# Patient Record
Sex: Female | Born: 1940 | Race: White | Hispanic: No | Marital: Married | Smoking: Former smoker
Health system: Southern US, Community
[De-identification: ages and names within clinical notes are randomized; demographics above are authoritative.]

## PROBLEM LIST (undated history)

## (undated) DIAGNOSIS — I2510 Atherosclerotic heart disease of native coronary artery without angina pectoris: Secondary | ICD-10-CM

## (undated) DIAGNOSIS — F40240 Claustrophobia: Secondary | ICD-10-CM

## (undated) DIAGNOSIS — F419 Anxiety disorder, unspecified: Secondary | ICD-10-CM

## (undated) DIAGNOSIS — I4891 Unspecified atrial fibrillation: Secondary | ICD-10-CM

## (undated) DIAGNOSIS — N186 End stage renal disease: Secondary | ICD-10-CM

## (undated) DIAGNOSIS — N189 Chronic kidney disease, unspecified: Secondary | ICD-10-CM

## (undated) DIAGNOSIS — E7800 Pure hypercholesterolemia, unspecified: Secondary | ICD-10-CM

## (undated) DIAGNOSIS — D631 Anemia in chronic kidney disease: Secondary | ICD-10-CM

## (undated) DIAGNOSIS — Z992 Dependence on renal dialysis: Secondary | ICD-10-CM

## (undated) DIAGNOSIS — I10 Essential (primary) hypertension: Secondary | ICD-10-CM

## (undated) DIAGNOSIS — M1990 Unspecified osteoarthritis, unspecified site: Secondary | ICD-10-CM

## (undated) DIAGNOSIS — Z923 Personal history of irradiation: Secondary | ICD-10-CM

## (undated) DIAGNOSIS — C8330 Diffuse large B-cell lymphoma, unspecified site: Secondary | ICD-10-CM

## (undated) DIAGNOSIS — F410 Panic disorder [episodic paroxysmal anxiety] without agoraphobia: Secondary | ICD-10-CM

## (undated) DIAGNOSIS — E119 Type 2 diabetes mellitus without complications: Secondary | ICD-10-CM

## (undated) HISTORY — PX: SHOULDER OPEN ROTATOR CUFF REPAIR: SHX2407

## (undated) HISTORY — DX: Chronic kidney disease, unspecified: N18.9

## (undated) HISTORY — DX: Personal history of irradiation: Z92.3

## (undated) HISTORY — DX: Anemia in chronic kidney disease: D63.1

---
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
## 1979-06-30 HISTORY — PX: ABDOMINAL HYSTERECTOMY: SHX81

## 1998-05-31 ENCOUNTER — Other Ambulatory Visit: Admission: RE | Admit: 1998-05-31 | Discharge: 1998-05-31 | Payer: Self-pay | Admitting: Obstetrics & Gynecology

## 1999-04-21 ENCOUNTER — Emergency Department (HOSPITAL_COMMUNITY): Admission: EM | Admit: 1999-04-21 | Discharge: 1999-04-21 | Payer: Self-pay | Admitting: Emergency Medicine

## 1999-04-21 ENCOUNTER — Encounter: Payer: Self-pay | Admitting: Cardiology

## 1999-05-12 ENCOUNTER — Encounter: Payer: Self-pay | Admitting: Emergency Medicine

## 1999-05-13 ENCOUNTER — Inpatient Hospital Stay (HOSPITAL_COMMUNITY): Admission: EM | Admit: 1999-05-13 | Discharge: 1999-05-15 | Payer: Self-pay | Admitting: Emergency Medicine

## 1999-07-11 ENCOUNTER — Other Ambulatory Visit: Admission: RE | Admit: 1999-07-11 | Discharge: 1999-07-11 | Payer: Self-pay | Admitting: Obstetrics and Gynecology

## 1999-11-20 ENCOUNTER — Observation Stay (HOSPITAL_COMMUNITY): Admission: EM | Admit: 1999-11-20 | Discharge: 1999-11-21 | Payer: Self-pay | Admitting: Emergency Medicine

## 2000-07-04 ENCOUNTER — Encounter: Admission: RE | Admit: 2000-07-04 | Discharge: 2000-07-04 | Payer: Self-pay | Admitting: Family Medicine

## 2000-07-04 ENCOUNTER — Encounter: Payer: Self-pay | Admitting: Family Medicine

## 2000-07-19 ENCOUNTER — Other Ambulatory Visit: Admission: RE | Admit: 2000-07-19 | Discharge: 2000-07-19 | Payer: Self-pay | Admitting: Obstetrics and Gynecology

## 2000-09-15 ENCOUNTER — Emergency Department (HOSPITAL_COMMUNITY): Admission: EM | Admit: 2000-09-15 | Discharge: 2000-09-15 | Payer: Self-pay | Admitting: Emergency Medicine

## 2001-02-06 ENCOUNTER — Emergency Department (HOSPITAL_COMMUNITY): Admission: EM | Admit: 2001-02-06 | Discharge: 2001-02-06 | Payer: Self-pay | Admitting: Emergency Medicine

## 2001-02-06 ENCOUNTER — Encounter: Payer: Self-pay | Admitting: Emergency Medicine

## 2001-02-14 ENCOUNTER — Encounter: Admission: RE | Admit: 2001-02-14 | Discharge: 2001-02-14 | Payer: Self-pay | Admitting: Emergency Medicine

## 2001-02-14 ENCOUNTER — Encounter: Payer: Self-pay | Admitting: Emergency Medicine

## 2001-07-21 ENCOUNTER — Encounter: Payer: Self-pay | Admitting: Family Medicine

## 2001-07-21 ENCOUNTER — Encounter: Admission: RE | Admit: 2001-07-21 | Discharge: 2001-07-21 | Payer: Self-pay | Admitting: Family Medicine

## 2001-09-17 ENCOUNTER — Other Ambulatory Visit: Admission: RE | Admit: 2001-09-17 | Discharge: 2001-09-17 | Payer: Self-pay | Admitting: Obstetrics and Gynecology

## 2002-07-11 ENCOUNTER — Emergency Department (HOSPITAL_COMMUNITY): Admission: EM | Admit: 2002-07-11 | Discharge: 2002-07-11 | Payer: Self-pay | Admitting: Emergency Medicine

## 2002-07-22 ENCOUNTER — Encounter: Payer: Self-pay | Admitting: Family Medicine

## 2002-07-22 ENCOUNTER — Encounter: Admission: RE | Admit: 2002-07-22 | Discharge: 2002-07-22 | Payer: Self-pay | Admitting: Family Medicine

## 2003-07-27 ENCOUNTER — Encounter: Admission: RE | Admit: 2003-07-27 | Discharge: 2003-07-27 | Payer: Self-pay | Admitting: Family Medicine

## 2003-07-27 ENCOUNTER — Encounter: Payer: Self-pay | Admitting: Family Medicine

## 2004-02-17 ENCOUNTER — Encounter: Admission: RE | Admit: 2004-02-17 | Discharge: 2004-02-17 | Payer: Self-pay | Admitting: Family Medicine

## 2004-04-17 ENCOUNTER — Encounter: Admission: RE | Admit: 2004-04-17 | Discharge: 2004-04-17 | Payer: Self-pay | Admitting: Family Medicine

## 2004-07-07 ENCOUNTER — Inpatient Hospital Stay (HOSPITAL_COMMUNITY): Admission: EM | Admit: 2004-07-07 | Discharge: 2004-07-07 | Payer: Self-pay | Admitting: Emergency Medicine

## 2004-08-04 ENCOUNTER — Encounter: Admission: RE | Admit: 2004-08-04 | Discharge: 2004-08-04 | Payer: Self-pay | Admitting: Family Medicine

## 2004-11-20 ENCOUNTER — Encounter: Admission: RE | Admit: 2004-11-20 | Discharge: 2004-11-20 | Payer: Self-pay | Admitting: Family Medicine

## 2004-12-07 ENCOUNTER — Encounter: Admission: RE | Admit: 2004-12-07 | Discharge: 2004-12-07 | Payer: Self-pay | Admitting: *Deleted

## 2005-01-03 ENCOUNTER — Ambulatory Visit (HOSPITAL_COMMUNITY): Admission: RE | Admit: 2005-01-03 | Discharge: 2005-01-03 | Payer: Self-pay | Admitting: *Deleted

## 2005-04-30 ENCOUNTER — Encounter: Admission: RE | Admit: 2005-04-30 | Discharge: 2005-04-30 | Payer: Self-pay | Admitting: Family Medicine

## 2005-09-03 ENCOUNTER — Encounter: Admission: RE | Admit: 2005-09-03 | Discharge: 2005-09-03 | Payer: Self-pay | Admitting: Family Medicine

## 2006-10-16 ENCOUNTER — Encounter: Admission: RE | Admit: 2006-10-16 | Discharge: 2006-10-16 | Payer: Self-pay | Admitting: Obstetrics and Gynecology

## 2007-06-28 ENCOUNTER — Ambulatory Visit: Payer: Self-pay | Admitting: Internal Medicine

## 2007-06-28 ENCOUNTER — Inpatient Hospital Stay (HOSPITAL_COMMUNITY): Admission: EM | Admit: 2007-06-28 | Discharge: 2007-07-01 | Payer: Self-pay | Admitting: Emergency Medicine

## 2007-10-20 ENCOUNTER — Encounter: Admission: RE | Admit: 2007-10-20 | Discharge: 2007-10-20 | Payer: Self-pay | Admitting: Family Medicine

## 2007-10-30 HISTORY — PX: APPENDECTOMY: SHX54

## 2007-11-06 ENCOUNTER — Encounter: Admission: RE | Admit: 2007-11-06 | Discharge: 2007-11-06 | Payer: Self-pay | Admitting: Family Medicine

## 2008-10-28 ENCOUNTER — Encounter: Admission: RE | Admit: 2008-10-28 | Discharge: 2008-10-28 | Payer: Self-pay | Admitting: Family Medicine

## 2009-01-01 ENCOUNTER — Emergency Department (HOSPITAL_COMMUNITY): Admission: EM | Admit: 2009-01-01 | Discharge: 2009-01-01 | Payer: Self-pay | Admitting: Emergency Medicine

## 2009-10-29 HISTORY — PX: CARDIAC CATHETERIZATION: SHX172

## 2009-11-30 ENCOUNTER — Encounter (INDEPENDENT_AMBULATORY_CARE_PROVIDER_SITE_OTHER): Payer: Self-pay | Admitting: Internal Medicine

## 2009-11-30 ENCOUNTER — Inpatient Hospital Stay (HOSPITAL_COMMUNITY): Admission: EM | Admit: 2009-11-30 | Discharge: 2009-12-22 | Payer: Self-pay | Admitting: Emergency Medicine

## 2009-12-03 ENCOUNTER — Ambulatory Visit: Payer: Self-pay | Admitting: Hematology & Oncology

## 2009-12-04 ENCOUNTER — Ambulatory Visit: Payer: Self-pay | Admitting: Internal Medicine

## 2009-12-05 ENCOUNTER — Encounter (INDEPENDENT_AMBULATORY_CARE_PROVIDER_SITE_OTHER): Payer: Self-pay | Admitting: Internal Medicine

## 2009-12-07 ENCOUNTER — Ambulatory Visit: Payer: Self-pay | Admitting: Oncology

## 2009-12-14 ENCOUNTER — Encounter (INDEPENDENT_AMBULATORY_CARE_PROVIDER_SITE_OTHER): Payer: Self-pay | Admitting: Internal Medicine

## 2009-12-15 ENCOUNTER — Ambulatory Visit: Payer: Self-pay | Admitting: Emergency Medicine

## 2009-12-17 ENCOUNTER — Ambulatory Visit: Payer: Self-pay | Admitting: Oncology

## 2009-12-18 ENCOUNTER — Ambulatory Visit: Payer: Self-pay | Admitting: Oncology

## 2009-12-22 ENCOUNTER — Ambulatory Visit: Payer: Self-pay | Admitting: Hematology & Oncology

## 2009-12-23 ENCOUNTER — Ambulatory Visit: Payer: Self-pay | Admitting: Oncology

## 2009-12-30 LAB — CBC WITH DIFFERENTIAL (CANCER CENTER ONLY)
BASO%: 4.4 % — ABNORMAL HIGH (ref 0.0–2.0)
EOS%: 1.4 % (ref 0.0–7.0)
HCT: 27.7 % — ABNORMAL LOW (ref 34.8–46.6)
LYMPH#: 4.9 10*3/uL — ABNORMAL HIGH (ref 0.9–3.3)
LYMPH%: 11.9 % — ABNORMAL LOW (ref 14.0–48.0)
MCH: 27.2 pg (ref 26.0–34.0)
MCHC: 32.8 g/dL (ref 32.0–36.0)
MCV: 83 fL (ref 81–101)
MONO%: 14.3 % — ABNORMAL HIGH (ref 0.0–13.0)
NEUT%: 68 % (ref 39.6–80.0)
RDW: 15.1 % — ABNORMAL HIGH (ref 10.5–14.6)

## 2009-12-30 LAB — COMPREHENSIVE METABOLIC PANEL
Albumin: 3.6 g/dL (ref 3.5–5.2)
BUN: 28 mg/dL — ABNORMAL HIGH (ref 6–23)
CO2: 30 mEq/L (ref 19–32)
Calcium: 10.4 mg/dL (ref 8.4–10.5)
Chloride: 95 mEq/L — ABNORMAL LOW (ref 96–112)
Glucose, Bld: 45 mg/dL — ABNORMAL LOW (ref 70–99)
Potassium: 5 mEq/L (ref 3.5–5.3)
Sodium: 137 mEq/L (ref 135–145)
Total Protein: 6.2 g/dL (ref 6.0–8.3)

## 2010-01-24 ENCOUNTER — Ambulatory Visit: Payer: Self-pay | Admitting: Hematology & Oncology

## 2010-01-26 LAB — CMP (CANCER CENTER ONLY)
ALT(SGPT): 12 U/L (ref 10–47)
Albumin: 3.1 g/dL — ABNORMAL LOW (ref 3.3–5.5)
Alkaline Phosphatase: 52 U/L (ref 26–84)
CO2: 28 mEq/L (ref 18–33)
Glucose, Bld: 133 mg/dL — ABNORMAL HIGH (ref 73–118)
Potassium: 4.5 mEq/L (ref 3.3–4.7)
Sodium: 138 mEq/L (ref 128–145)
Total Bilirubin: 0.6 mg/dl (ref 0.20–1.60)
Total Protein: 5.9 g/dL — ABNORMAL LOW (ref 6.4–8.1)

## 2010-01-26 LAB — CBC WITH DIFFERENTIAL (CANCER CENTER ONLY)
BASO%: 0.9 % (ref 0.0–2.0)
Eosinophils Absolute: 0.1 10*3/uL (ref 0.0–0.5)
LYMPH%: 18.7 % (ref 14.0–48.0)
MONO#: 0.5 10*3/uL (ref 0.1–0.9)
MONO%: 11.6 % (ref 0.0–13.0)
NEUT#: 3.1 10*3/uL (ref 1.5–6.5)
Platelets: 429 10*3/uL — ABNORMAL HIGH (ref 145–400)
RBC: 3.26 10*6/uL — ABNORMAL LOW (ref 3.70–5.32)
RDW: 20.2 % — ABNORMAL HIGH (ref 10.5–14.6)
WBC: 4.6 10*3/uL (ref 3.9–10.0)

## 2010-01-27 ENCOUNTER — Ambulatory Visit: Payer: Self-pay | Admitting: Oncology

## 2010-02-09 ENCOUNTER — Ambulatory Visit (HOSPITAL_COMMUNITY): Admission: RE | Admit: 2010-02-09 | Discharge: 2010-02-09 | Payer: Self-pay | Admitting: Hematology & Oncology

## 2010-02-16 LAB — LACTATE DEHYDROGENASE: LDH: 196 U/L (ref 94–250)

## 2010-02-16 LAB — COMPREHENSIVE METABOLIC PANEL
ALT: 9 U/L (ref 0–35)
AST: 15 U/L (ref 0–37)
Creatinine, Ser: 1 mg/dL (ref 0.40–1.20)
Total Bilirubin: 0.3 mg/dL (ref 0.3–1.2)

## 2010-02-16 LAB — CBC WITH DIFFERENTIAL (CANCER CENTER ONLY)
BASO#: 0 10*3/uL (ref 0.0–0.2)
BASO%: 0.9 % (ref 0.0–2.0)
HCT: 29.9 % — ABNORMAL LOW (ref 34.8–46.6)
LYMPH%: 33.8 % (ref 14.0–48.0)
MCHC: 34.1 g/dL (ref 32.0–36.0)
MCV: 92 fL (ref 81–101)
MONO#: 0.6 10*3/uL (ref 0.1–0.9)
NEUT%: 48.6 % (ref 39.6–80.0)
RDW: 18.4 % — ABNORMAL HIGH (ref 10.5–14.6)
WBC: 4.4 10*3/uL (ref 3.9–10.0)

## 2010-03-08 ENCOUNTER — Ambulatory Visit: Payer: Self-pay | Admitting: Hematology & Oncology

## 2010-03-09 LAB — COMPREHENSIVE METABOLIC PANEL
AST: 14 U/L (ref 0–37)
Albumin: 3.9 g/dL (ref 3.5–5.2)
Alkaline Phosphatase: 56 U/L (ref 39–117)
BUN: 21 mg/dL (ref 6–23)
Calcium: 9.1 mg/dL (ref 8.4–10.5)
Chloride: 103 mEq/L (ref 96–112)
Creatinine, Ser: 0.94 mg/dL (ref 0.40–1.20)
Glucose, Bld: 155 mg/dL — ABNORMAL HIGH (ref 70–99)

## 2010-03-09 LAB — CBC WITH DIFFERENTIAL (CANCER CENTER ONLY)
BASO#: 0 10*3/uL (ref 0.0–0.2)
EOS%: 2.3 % (ref 0.0–7.0)
Eosinophils Absolute: 0.1 10*3/uL (ref 0.0–0.5)
HCT: 30.7 % — ABNORMAL LOW (ref 34.8–46.6)
HGB: 10.5 g/dL — ABNORMAL LOW (ref 11.6–15.9)
MCH: 32.6 pg (ref 26.0–34.0)
MCHC: 34.1 g/dL (ref 32.0–36.0)
MCV: 96 fL (ref 81–101)
MONO%: 13.2 % — ABNORMAL HIGH (ref 0.0–13.0)
NEUT%: 58.2 % (ref 39.6–80.0)
RBC: 3.21 10*6/uL — ABNORMAL LOW (ref 3.70–5.32)

## 2010-03-10 ENCOUNTER — Ambulatory Visit: Payer: Self-pay | Admitting: Oncology

## 2010-03-30 LAB — CBC WITH DIFFERENTIAL (CANCER CENTER ONLY)
Eosinophils Absolute: 0.2 10*3/uL (ref 0.0–0.5)
LYMPH%: 25.9 % (ref 14.0–48.0)
MCH: 33.1 pg (ref 26.0–34.0)
MCV: 96 fL (ref 81–101)
MONO%: 10.2 % (ref 0.0–13.0)
Platelets: 340 10*3/uL (ref 145–400)
RBC: 3.35 10*6/uL — ABNORMAL LOW (ref 3.70–5.32)
RDW: 14.2 % (ref 10.5–14.6)

## 2010-03-30 LAB — COMPREHENSIVE METABOLIC PANEL
Alkaline Phosphatase: 60 U/L (ref 39–117)
Creatinine, Ser: 1.2 mg/dL (ref 0.40–1.20)
Glucose, Bld: 168 mg/dL — ABNORMAL HIGH (ref 70–99)
Sodium: 136 mEq/L (ref 135–145)
Total Bilirubin: 0.3 mg/dL (ref 0.3–1.2)
Total Protein: 6.2 g/dL (ref 6.0–8.3)

## 2010-04-18 ENCOUNTER — Ambulatory Visit: Payer: Self-pay | Admitting: Hematology & Oncology

## 2010-04-19 ENCOUNTER — Ambulatory Visit: Payer: Self-pay | Admitting: Oncology

## 2010-04-20 LAB — CBC WITH DIFFERENTIAL (CANCER CENTER ONLY)
BASO#: 0.1 10*3/uL (ref 0.0–0.2)
Eosinophils Absolute: 0.3 10*3/uL (ref 0.0–0.5)
HCT: 33.5 % — ABNORMAL LOW (ref 34.8–46.6)
HGB: 11.6 g/dL (ref 11.6–15.9)
LYMPH%: 25.3 % (ref 14.0–48.0)
MCV: 95 fL (ref 81–101)
MONO#: 1.2 10*3/uL — ABNORMAL HIGH (ref 0.1–0.9)
NEUT%: 55.1 % (ref 39.6–80.0)
RBC: 3.53 10*6/uL — ABNORMAL LOW (ref 3.70–5.32)
RDW: 12.9 % (ref 10.5–14.6)
WBC: 8.1 10*3/uL (ref 3.9–10.0)

## 2010-05-17 LAB — CBC WITH DIFFERENTIAL (CANCER CENTER ONLY)
BASO#: 0.1 10*3/uL (ref 0.0–0.2)
Eosinophils Absolute: 0.4 10*3/uL (ref 0.0–0.5)
HGB: 9.5 g/dL — ABNORMAL LOW (ref 11.6–15.9)
LYMPH#: 2.1 10*3/uL (ref 0.9–3.3)
MCH: 31.5 pg (ref 26.0–34.0)
NEUT#: 6.1 10*3/uL (ref 1.5–6.5)
RBC: 3.02 10*6/uL — ABNORMAL LOW (ref 3.70–5.32)

## 2010-05-18 ENCOUNTER — Ambulatory Visit: Payer: Self-pay | Admitting: Hematology & Oncology

## 2010-05-24 ENCOUNTER — Ambulatory Visit: Payer: Self-pay | Admitting: Oncology

## 2010-05-25 LAB — CBC WITH DIFFERENTIAL (CANCER CENTER ONLY)
BASO#: 0.1 10*3/uL (ref 0.0–0.2)
Eosinophils Absolute: 0.8 10*3/uL — ABNORMAL HIGH (ref 0.0–0.5)
HGB: 9.5 g/dL — ABNORMAL LOW (ref 11.6–15.9)
LYMPH%: 32.8 % (ref 14.0–48.0)
MCH: 31.1 pg (ref 26.0–34.0)
MCV: 93 fL (ref 81–101)
MONO%: 11.9 % (ref 0.0–13.0)
RBC: 3.06 10*6/uL — ABNORMAL LOW (ref 3.70–5.32)

## 2010-06-02 ENCOUNTER — Inpatient Hospital Stay (HOSPITAL_COMMUNITY): Admission: EM | Admit: 2010-06-02 | Discharge: 2010-06-03 | Payer: Self-pay | Admitting: Emergency Medicine

## 2010-06-03 ENCOUNTER — Encounter (INDEPENDENT_AMBULATORY_CARE_PROVIDER_SITE_OTHER): Payer: Self-pay | Admitting: Cardiology

## 2010-06-03 ENCOUNTER — Ambulatory Visit: Payer: Self-pay | Admitting: Hematology & Oncology

## 2010-06-04 ENCOUNTER — Inpatient Hospital Stay (HOSPITAL_COMMUNITY): Admission: EM | Admit: 2010-06-04 | Discharge: 2010-06-07 | Payer: Self-pay | Admitting: Emergency Medicine

## 2010-06-12 LAB — CBC WITH DIFFERENTIAL (CANCER CENTER ONLY)
BASO#: 0 10*3/uL (ref 0.0–0.2)
BASO%: 0.5 % (ref 0.0–2.0)
EOS%: 1.5 % (ref 0.0–7.0)
LYMPH%: 19 % (ref 14.0–48.0)
MONO#: 0.6 10*3/uL (ref 0.1–0.9)
NEUT#: 4.6 10*3/uL (ref 1.5–6.5)
NEUT%: 70.5 % (ref 39.6–80.0)
Platelets: 327 10*3/uL (ref 145–400)

## 2010-06-12 LAB — CMP (CANCER CENTER ONLY)
ALT(SGPT): 15 U/L (ref 10–47)
AST: 22 U/L (ref 11–38)
Albumin: 3.8 g/dL (ref 3.3–5.5)
BUN, Bld: 16 mg/dL (ref 7–22)
Calcium: 10 mg/dL (ref 8.0–10.3)
Creat: 1.1 mg/dl (ref 0.6–1.2)

## 2010-06-23 ENCOUNTER — Ambulatory Visit: Payer: Self-pay | Admitting: Hematology & Oncology

## 2010-06-26 LAB — COMPREHENSIVE METABOLIC PANEL
ALT: 10 U/L (ref 0–35)
AST: 15 U/L (ref 0–37)
BUN: 25 mg/dL — ABNORMAL HIGH (ref 6–23)
Creatinine, Ser: 0.88 mg/dL (ref 0.40–1.20)
Total Bilirubin: 0.3 mg/dL (ref 0.3–1.2)
Total Protein: 5.5 g/dL — ABNORMAL LOW (ref 6.0–8.3)

## 2010-06-26 LAB — CBC WITH DIFFERENTIAL (CANCER CENTER ONLY)
BASO#: 0 10*3/uL (ref 0.0–0.2)
EOS%: 8.3 % — ABNORMAL HIGH (ref 0.0–7.0)
Eosinophils Absolute: 0.5 10*3/uL (ref 0.0–0.5)
HCT: 29.6 % — ABNORMAL LOW (ref 34.8–46.6)
LYMPH%: 26.3 % (ref 14.0–48.0)
NEUT#: 3.2 10*3/uL (ref 1.5–6.5)
Platelets: 237 10*3/uL (ref 145–400)

## 2010-07-17 ENCOUNTER — Ambulatory Visit: Payer: Self-pay | Admitting: Hematology & Oncology

## 2010-07-17 ENCOUNTER — Ambulatory Visit (HOSPITAL_COMMUNITY): Admission: RE | Admit: 2010-07-17 | Discharge: 2010-07-17 | Payer: Self-pay | Admitting: Hematology & Oncology

## 2010-07-21 ENCOUNTER — Ambulatory Visit (HOSPITAL_COMMUNITY)
Admission: RE | Admit: 2010-07-21 | Discharge: 2010-07-21 | Payer: Self-pay | Source: Home / Self Care | Admitting: Hematology & Oncology

## 2010-08-01 ENCOUNTER — Ambulatory Visit: Payer: Self-pay | Admitting: Hematology & Oncology

## 2010-08-03 LAB — CBC WITH DIFFERENTIAL (CANCER CENTER ONLY)
EOS%: 6 % (ref 0.0–7.0)
Eosinophils Absolute: 0.3 10*3/uL (ref 0.0–0.5)
HCT: 33 % — ABNORMAL LOW (ref 34.8–46.6)
MCHC: 33.7 g/dL (ref 32.0–36.0)
MCV: 92 fL (ref 81–101)
MONO%: 8.9 % (ref 0.0–13.0)
RDW: 13.9 % (ref 10.5–14.6)

## 2010-08-03 LAB — COMPREHENSIVE METABOLIC PANEL
Albumin: 4.2 g/dL (ref 3.5–5.2)
CO2: 27 mEq/L (ref 19–32)
Calcium: 9.3 mg/dL (ref 8.4–10.5)
Chloride: 102 mEq/L (ref 96–112)
Sodium: 141 mEq/L (ref 135–145)

## 2010-08-03 LAB — LACTATE DEHYDROGENASE: LDH: 166 U/L (ref 94–250)

## 2010-08-03 LAB — FERRITIN: Ferritin: 544 ng/mL — ABNORMAL HIGH (ref 10–291)

## 2010-08-03 LAB — FOLATE: Folate: >20 ng/mL

## 2010-09-12 ENCOUNTER — Ambulatory Visit: Payer: Self-pay | Admitting: Oncology

## 2010-10-05 ENCOUNTER — Ambulatory Visit (HOSPITAL_COMMUNITY)
Admission: RE | Admit: 2010-10-05 | Discharge: 2010-10-05 | Payer: Self-pay | Source: Home / Self Care | Attending: Hematology & Oncology | Admitting: Hematology & Oncology

## 2010-11-14 ENCOUNTER — Ambulatory Visit: Payer: Self-pay | Admitting: Hematology & Oncology

## 2010-11-15 LAB — CBC WITH DIFFERENTIAL (CANCER CENTER ONLY)
BASO#: 0 10*3/uL (ref 0.0–0.2)
BASO%: 0.4 % (ref 0.0–2.0)
EOS%: 3.5 % (ref 0.0–7.0)
Eosinophils Absolute: 0.1 10*3/uL (ref 0.0–0.5)
HCT: 35.2 % (ref 34.8–46.6)
HGB: 12.1 g/dL (ref 11.6–15.9)
LYMPH#: 1.8 10*3/uL (ref 0.9–3.3)
LYMPH%: 50.1 % — ABNORMAL HIGH (ref 14.0–48.0)
MCH: 32.3 pg (ref 26.0–34.0)
MCHC: 34.5 g/dL (ref 32.0–36.0)
MCV: 94 fL (ref 81–101)
MONO#: 0.3 10*3/uL (ref 0.1–0.9)
MONO%: 8.2 % (ref 0.0–13.0)
NEUT#: 1.3 10*3/uL — ABNORMAL LOW (ref 1.5–6.5)
NEUT%: 37.8 % — ABNORMAL LOW (ref 39.6–80.0)
Platelets: 224 10*3/uL (ref 145–400)
RBC: 3.75 10*6/uL (ref 3.70–5.32)
RDW: 12.6 % (ref 10.5–14.6)
WBC: 3.5 10*3/uL — ABNORMAL LOW (ref 3.9–10.0)

## 2010-11-16 LAB — COMPREHENSIVE METABOLIC PANEL
ALT: 13 U/L (ref 0–35)
AST: 15 U/L (ref 0–37)
Albumin: 4.5 g/dL (ref 3.5–5.2)
Alkaline Phosphatase: 54 U/L (ref 39–117)
BUN: 31 mg/dL — ABNORMAL HIGH (ref 6–23)
CO2: 25 mEq/L (ref 19–32)
Calcium: 9.7 mg/dL (ref 8.4–10.5)
Chloride: 102 mEq/L (ref 96–112)
Creatinine, Ser: 1.11 mg/dL (ref 0.40–1.20)
Glucose, Bld: 172 mg/dL — ABNORMAL HIGH (ref 70–99)
Potassium: 4.2 mEq/L (ref 3.5–5.3)
Sodium: 139 mEq/L (ref 135–145)
Total Bilirubin: 0.4 mg/dL (ref 0.3–1.2)
Total Protein: 6.6 g/dL (ref 6.0–8.3)

## 2010-11-16 LAB — LACTATE DEHYDROGENASE: LDH: 144 U/L (ref 94–250)

## 2010-11-16 LAB — VITAMIN D 25 HYDROXY (VIT D DEFICIENCY, FRACTURES): Vit D, 25-Hydroxy: 58 ng/mL (ref 30–89)

## 2010-11-17 ENCOUNTER — Other Ambulatory Visit: Payer: Self-pay | Admitting: Hematology & Oncology

## 2010-11-17 DIAGNOSIS — C8590 Non-Hodgkin lymphoma, unspecified, unspecified site: Secondary | ICD-10-CM

## 2010-11-18 ENCOUNTER — Encounter: Payer: Self-pay | Admitting: Cardiology

## 2010-11-19 ENCOUNTER — Encounter: Payer: Self-pay | Admitting: Hematology & Oncology

## 2010-11-19 ENCOUNTER — Encounter: Payer: Self-pay | Admitting: Family Medicine

## 2010-12-27 ENCOUNTER — Encounter (HOSPITAL_BASED_OUTPATIENT_CLINIC_OR_DEPARTMENT_OTHER): Payer: Medicare Other | Admitting: Oncology

## 2010-12-27 DIAGNOSIS — C8589 Other specified types of non-Hodgkin lymphoma, extranodal and solid organ sites: Secondary | ICD-10-CM

## 2010-12-27 DIAGNOSIS — Z452 Encounter for adjustment and management of vascular access device: Secondary | ICD-10-CM

## 2011-01-10 ENCOUNTER — Encounter (HOSPITAL_COMMUNITY): Payer: Self-pay

## 2011-01-10 ENCOUNTER — Encounter (HOSPITAL_COMMUNITY)
Admission: RE | Admit: 2011-01-10 | Discharge: 2011-01-10 | Disposition: A | Discharge status: Home / Self Care | Payer: Medicare Other | Source: Ambulatory Visit | Attending: Hematology & Oncology | Admitting: Hematology & Oncology

## 2011-01-10 DIAGNOSIS — C8589 Other specified types of non-Hodgkin lymphoma, extranodal and solid organ sites: Secondary | ICD-10-CM | POA: Insufficient documentation

## 2011-01-10 DIAGNOSIS — C8590 Non-Hodgkin lymphoma, unspecified, unspecified site: Secondary | ICD-10-CM

## 2011-01-10 LAB — GLUCOSE, CAPILLARY: Glucose-Capillary: 155 mg/dL — ABNORMAL HIGH (ref 70–99)

## 2011-01-10 MED ORDER — FLUDEOXYGLUCOSE F - 18 (FDG) INJECTION
14.5000 | Freq: Once | INTRAVENOUS | Status: AC | PRN
  Administered 2011-01-10: 14.5 via INTRAVENOUS

## 2011-01-11 LAB — CBC
HCT: 29.2 % — ABNORMAL LOW (ref 36.0–46.0)
Hemoglobin: 10 g/dL — ABNORMAL LOW (ref 12.0–15.0)
MCV: 93.1 fL (ref 78.0–100.0)
RBC: 3.14 MIL/uL — ABNORMAL LOW (ref 3.87–5.11)
RDW: 18.3 % — ABNORMAL HIGH (ref 11.5–15.5)
WBC: 4.8 10*3/uL (ref 4.0–10.5)

## 2011-01-11 LAB — DIFFERENTIAL
Basophils Absolute: 0 10*3/uL (ref 0.0–0.1)
Eosinophils Relative: 9 % — ABNORMAL HIGH (ref 0–5)
Lymphocytes Relative: 54 % — ABNORMAL HIGH (ref 12–46)
Lymphs Abs: 2.6 10*3/uL (ref 0.7–4.0)
Monocytes Absolute: 0.5 10*3/uL (ref 0.1–1.0)
Monocytes Relative: 11 % (ref 3–12)
Neutro Abs: 1.2 10*3/uL — ABNORMAL LOW (ref 1.7–7.7)

## 2011-01-11 LAB — BONE MARROW EXAM

## 2011-01-11 LAB — GLUCOSE, CAPILLARY: Glucose-Capillary: 101 mg/dL — ABNORMAL HIGH (ref 70–99)

## 2011-01-11 LAB — CHROMOSOME ANALYSIS, BONE MARROW

## 2011-01-12 LAB — CBC
HCT: 19.2 % — ABNORMAL LOW (ref 36.0–46.0)
HCT: 27.4 % — ABNORMAL LOW (ref 36.0–46.0)
Hemoglobin: 8.4 g/dL — ABNORMAL LOW (ref 12.0–15.0)
Hemoglobin: 9.6 g/dL — ABNORMAL LOW (ref 12.0–15.0)
MCH: 31.1 pg (ref 26.0–34.0)
MCH: 31.2 pg (ref 26.0–34.0)
MCH: 32.3 pg (ref 26.0–34.0)
MCHC: 34.9 g/dL (ref 30.0–36.0)
MCHC: 35.5 g/dL (ref 30.0–36.0)
MCV: 89 fL (ref 78.0–100.0)
MCV: 89.3 fL (ref 78.0–100.0)
MCV: 91.7 fL (ref 78.0–100.0)
Platelets: 59 10*3/uL — ABNORMAL LOW (ref 150–400)
Platelets: 67 10*3/uL — ABNORMAL LOW (ref 150–400)
Platelets: 70 10*3/uL — ABNORMAL LOW (ref 150–400)
RBC: 2.1 MIL/uL — ABNORMAL LOW (ref 3.87–5.11)
RBC: 2.59 MIL/uL — ABNORMAL LOW (ref 3.87–5.11)
RBC: 3.02 MIL/uL — ABNORMAL LOW (ref 3.87–5.11)
RDW: 18 % — ABNORMAL HIGH (ref 11.5–15.5)
WBC: 3 10*3/uL — ABNORMAL LOW (ref 4.0–10.5)

## 2011-01-12 LAB — HEMOGLOBIN A1C
Hgb A1c MFr Bld: 7.2 % — ABNORMAL HIGH (ref <5.7)
Mean Plasma Glucose: 160 mg/dL — ABNORMAL HIGH (ref <117)

## 2011-01-12 LAB — CROSSMATCH

## 2011-01-12 LAB — BASIC METABOLIC PANEL
BUN: 10 mg/dL (ref 6–23)
BUN: 23 mg/dL (ref 6–23)
CO2: 28 mEq/L (ref 19–32)
CO2: 29 mEq/L (ref 19–32)
Calcium: 8.6 mg/dL (ref 8.4–10.5)
Chloride: 100 mEq/L (ref 96–112)
Chloride: 102 mEq/L (ref 96–112)
Creatinine, Ser: 0.9 mg/dL (ref 0.4–1.2)
Creatinine, Ser: 1.21 mg/dL — ABNORMAL HIGH (ref 0.4–1.2)
GFR calc non Af Amer: >60 mL/min (ref >60)
Glucose, Bld: 109 mg/dL — ABNORMAL HIGH (ref 70–99)
Glucose, Bld: 75 mg/dL (ref 70–99)
Sodium: 137 mEq/L (ref 135–145)

## 2011-01-12 LAB — DIFFERENTIAL
Basophils Absolute: 0 10*3/uL (ref 0.0–0.1)
Basophils Absolute: 0 10*3/uL (ref 0.0–0.1)
Basophils Relative: 0 % (ref 0–1)
Eosinophils Absolute: 0.1 10*3/uL (ref 0.0–0.7)
Eosinophils Relative: 12 % — ABNORMAL HIGH (ref 0–5)
Eosinophils Relative: 3 % (ref 0–5)
Lymphocytes Relative: 66 % — ABNORMAL HIGH (ref 12–46)
Monocytes Absolute: 0.2 10*3/uL (ref 0.1–1.0)
Monocytes Relative: 12 % (ref 3–12)
Monocytes Relative: 8 % (ref 3–12)
Neutro Abs: 2 10*3/uL (ref 1.7–7.7)

## 2011-01-12 LAB — COMPREHENSIVE METABOLIC PANEL
ALT: 10 U/L (ref 0–35)
Albumin: 2.8 g/dL — ABNORMAL LOW (ref 3.5–5.2)
Alkaline Phosphatase: 52 U/L (ref 39–117)
BUN: 31 mg/dL — ABNORMAL HIGH (ref 6–23)
CO2: 26 mEq/L (ref 19–32)
Calcium: 8.2 mg/dL — ABNORMAL LOW (ref 8.4–10.5)
Chloride: 100 mEq/L (ref 96–112)
Creatinine, Ser: 1.13 mg/dL (ref 0.4–1.2)
GFR calc Af Amer: 58 mL/min — ABNORMAL LOW (ref >60)
GFR calc non Af Amer: 48 mL/min — ABNORMAL LOW (ref >60)
Glucose, Bld: 201 mg/dL — ABNORMAL HIGH (ref 70–99)
Glucose, Bld: 202 mg/dL — ABNORMAL HIGH (ref 70–99)
Potassium: 3.8 mEq/L (ref 3.5–5.1)
Sodium: 133 mEq/L — ABNORMAL LOW (ref 135–145)
Total Bilirubin: 0.5 mg/dL (ref 0.3–1.2)
Total Protein: 5.1 g/dL — ABNORMAL LOW (ref 6.0–8.3)

## 2011-01-12 LAB — GLUCOSE, CAPILLARY
Glucose-Capillary: 104 mg/dL — ABNORMAL HIGH (ref 70–99)
Glucose-Capillary: 112 mg/dL — ABNORMAL HIGH (ref 70–99)
Glucose-Capillary: 120 mg/dL — ABNORMAL HIGH (ref 70–99)
Glucose-Capillary: 120 mg/dL — ABNORMAL HIGH (ref 70–99)
Glucose-Capillary: 125 mg/dL — ABNORMAL HIGH (ref 70–99)
Glucose-Capillary: 128 mg/dL — ABNORMAL HIGH (ref 70–99)
Glucose-Capillary: 147 mg/dL — ABNORMAL HIGH (ref 70–99)

## 2011-01-12 LAB — POCT I-STAT, CHEM 8
Creatinine, Ser: 1.4 mg/dL — ABNORMAL HIGH (ref 0.4–1.2)
HCT: 25 % — ABNORMAL LOW (ref 36.0–46.0)
Hemoglobin: 8.5 g/dL — ABNORMAL LOW (ref 12.0–15.0)
Potassium: 3.8 mEq/L (ref 3.5–5.1)
Sodium: 134 mEq/L — ABNORMAL LOW (ref 135–145)
TCO2: 22 mmol/L (ref 0–100)

## 2011-01-12 LAB — CK TOTAL AND CKMB (NOT AT ARMC)
CK, MB: 0.9 ng/mL (ref 0.3–4.0)
Relative Index: INVALID (ref 0.0–2.5)
Relative Index: INVALID (ref 0.0–2.5)
Total CK: 12 U/L (ref 7–177)
Total CK: 16 U/L (ref 7–177)
Total CK: 20 U/L (ref 7–177)

## 2011-01-12 LAB — FERRITIN: Ferritin: 1536 ng/mL — ABNORMAL HIGH (ref 10–291)

## 2011-01-12 LAB — TROPONIN I: Troponin I: 0.13 ng/mL — ABNORMAL HIGH (ref 0.00–0.06)

## 2011-01-12 LAB — IRON AND TIBC: Saturation Ratios: 25 % (ref 20–55)

## 2011-01-12 LAB — PROTIME-INR
INR: 1.08 (ref 0.00–1.49)
Prothrombin Time: 14.2 seconds (ref 11.6–15.2)

## 2011-01-18 LAB — BLOOD GAS, ARTERIAL
Acid-Base Excess: 4.5 mmol/L — ABNORMAL HIGH (ref 0.0–2.0)
Acid-Base Excess: 5 mmol/L — ABNORMAL HIGH (ref 0.0–2.0)
Acid-base deficit: 3.3 mmol/L — ABNORMAL HIGH (ref 0.0–2.0)
Bicarbonate: 19 mEq/L — ABNORMAL LOW (ref 20.0–24.0)
Bicarbonate: 28 mEq/L — ABNORMAL HIGH (ref 20.0–24.0)
FIO2: 1 %
O2 Content: 4 L/min
O2 Saturation: 86.5 %
O2 Saturation: 91.8 %
Patient temperature: 98.6
TCO2: 17.4 mmol/L (ref 0–100)
TCO2: 26.1 mmol/L (ref 0–100)
TCO2: 26.2 mmol/L (ref 0–100)
TCO2: 26.2 mmol/L (ref 0–100)
pCO2 arterial: 36.4 mmHg (ref 35.0–45.0)
pCO2 arterial: 40.4 mmHg (ref 35.0–45.0)
pH, Arterial: 7.459 — ABNORMAL HIGH (ref 7.350–7.400)
pH, Arterial: 7.48 — ABNORMAL HIGH (ref 7.350–7.400)
pO2, Arterial: 48.4 mmHg — ABNORMAL LOW (ref 80.0–100.0)
pO2, Arterial: 67.1 mmHg — ABNORMAL LOW (ref 80.0–100.0)

## 2011-01-18 LAB — CBC
HCT: 23.2 % — ABNORMAL LOW (ref 36.0–46.0)
HCT: 25.3 % — ABNORMAL LOW (ref 36.0–46.0)
HCT: 25.4 % — ABNORMAL LOW (ref 36.0–46.0)
HCT: 25.5 % — ABNORMAL LOW (ref 36.0–46.0)
HCT: 25.7 % — ABNORMAL LOW (ref 36.0–46.0)
HCT: 26.7 % — ABNORMAL LOW (ref 36.0–46.0)
HCT: 28.5 % — ABNORMAL LOW (ref 36.0–46.0)
HCT: 28.6 % — ABNORMAL LOW (ref 36.0–46.0)
HCT: 29.5 % — ABNORMAL LOW (ref 36.0–46.0)
Hemoglobin: 10 g/dL — ABNORMAL LOW (ref 12.0–15.0)
Hemoglobin: 10.1 g/dL — ABNORMAL LOW (ref 12.0–15.0)
Hemoglobin: 8.7 g/dL — ABNORMAL LOW (ref 12.0–15.0)
Hemoglobin: 8.9 g/dL — ABNORMAL LOW (ref 12.0–15.0)
Hemoglobin: 9.7 g/dL — ABNORMAL LOW (ref 12.0–15.0)
MCHC: 33.2 g/dL (ref 30.0–36.0)
MCHC: 33.3 g/dL (ref 30.0–36.0)
MCHC: 33.4 g/dL (ref 30.0–36.0)
MCHC: 33.7 g/dL (ref 30.0–36.0)
MCHC: 33.9 g/dL (ref 30.0–36.0)
MCHC: 33.9 g/dL (ref 30.0–36.0)
MCHC: 34.3 g/dL (ref 30.0–36.0)
MCHC: 34.4 g/dL (ref 30.0–36.0)
MCHC: 34.5 g/dL (ref 30.0–36.0)
MCHC: 34.6 g/dL (ref 30.0–36.0)
MCV: 80.2 fL (ref 78.0–100.0)
MCV: 80.7 fL (ref 78.0–100.0)
MCV: 81.3 fL (ref 78.0–100.0)
MCV: 81.6 fL (ref 78.0–100.0)
MCV: 82.3 fL (ref 78.0–100.0)
MCV: 82.3 fL (ref 78.0–100.0)
MCV: 82.3 fL (ref 78.0–100.0)
MCV: 82.8 fL (ref 78.0–100.0)
MCV: 83.3 fL (ref 78.0–100.0)
Platelets: 137 10*3/uL — ABNORMAL LOW (ref 150–400)
Platelets: 155 10*3/uL (ref 150–400)
Platelets: 156 10*3/uL (ref 150–400)
Platelets: 159 10*3/uL (ref 150–400)
Platelets: 175 10*3/uL (ref 150–400)
Platelets: 179 10*3/uL (ref 150–400)
Platelets: 183 10*3/uL (ref 150–400)
Platelets: 200 10*3/uL (ref 150–400)
Platelets: 221 10*3/uL (ref 150–400)
Platelets: 50 10*3/uL — ABNORMAL LOW (ref 150–400)
Platelets: 93 10*3/uL — ABNORMAL LOW (ref 150–400)
Platelets: 97 10*3/uL — ABNORMAL LOW (ref 150–400)
RBC: 2.75 MIL/uL — ABNORMAL LOW (ref 3.87–5.11)
RBC: 2.99 MIL/uL — ABNORMAL LOW (ref 3.87–5.11)
RBC: 3.1 MIL/uL — ABNORMAL LOW (ref 3.87–5.11)
RBC: 3.18 MIL/uL — ABNORMAL LOW (ref 3.87–5.11)
RBC: 3.22 MIL/uL — ABNORMAL LOW (ref 3.87–5.11)
RBC: 3.26 MIL/uL — ABNORMAL LOW (ref 3.87–5.11)
RBC: 3.28 MIL/uL — ABNORMAL LOW (ref 3.87–5.11)
RBC: 3.48 MIL/uL — ABNORMAL LOW (ref 3.87–5.11)
RBC: 3.55 MIL/uL — ABNORMAL LOW (ref 3.87–5.11)
RBC: 3.63 MIL/uL — ABNORMAL LOW (ref 3.87–5.11)
RBC: 3.64 MIL/uL — ABNORMAL LOW (ref 3.87–5.11)
RBC: 3.68 MIL/uL — ABNORMAL LOW (ref 3.87–5.11)
RDW: 16.2 % — ABNORMAL HIGH (ref 11.5–15.5)
RDW: 16.2 % — ABNORMAL HIGH (ref 11.5–15.5)
RDW: 16.8 % — ABNORMAL HIGH (ref 11.5–15.5)
RDW: 16.8 % — ABNORMAL HIGH (ref 11.5–15.5)
RDW: 16.9 % — ABNORMAL HIGH (ref 11.5–15.5)
RDW: 17 % — ABNORMAL HIGH (ref 11.5–15.5)
RDW: 17.1 % — ABNORMAL HIGH (ref 11.5–15.5)
RDW: 17.3 % — ABNORMAL HIGH (ref 11.5–15.5)
RDW: 17.5 % — ABNORMAL HIGH (ref 11.5–15.5)
WBC: 0.8 10*3/uL — CL (ref 4.0–10.5)
WBC: 2.8 10*3/uL — ABNORMAL LOW (ref 4.0–10.5)
WBC: 3.3 10*3/uL — ABNORMAL LOW (ref 4.0–10.5)
WBC: 3.4 10*3/uL — ABNORMAL LOW (ref 4.0–10.5)
WBC: 3.4 10*3/uL — ABNORMAL LOW (ref 4.0–10.5)
WBC: 3.7 10*3/uL — ABNORMAL LOW (ref 4.0–10.5)
WBC: 3.8 10*3/uL — ABNORMAL LOW (ref 4.0–10.5)
WBC: 4.2 10*3/uL (ref 4.0–10.5)
WBC: 5.2 10*3/uL (ref 4.0–10.5)
WBC: 5.3 10*3/uL (ref 4.0–10.5)
WBC: 6.2 10*3/uL (ref 4.0–10.5)

## 2011-01-18 LAB — GLUCOSE, CAPILLARY
Glucose-Capillary: 103 mg/dL — ABNORMAL HIGH (ref 70–99)
Glucose-Capillary: 110 mg/dL — ABNORMAL HIGH (ref 70–99)
Glucose-Capillary: 113 mg/dL — ABNORMAL HIGH (ref 70–99)
Glucose-Capillary: 114 mg/dL — ABNORMAL HIGH (ref 70–99)
Glucose-Capillary: 116 mg/dL — ABNORMAL HIGH (ref 70–99)
Glucose-Capillary: 116 mg/dL — ABNORMAL HIGH (ref 70–99)
Glucose-Capillary: 126 mg/dL — ABNORMAL HIGH (ref 70–99)
Glucose-Capillary: 126 mg/dL — ABNORMAL HIGH (ref 70–99)
Glucose-Capillary: 130 mg/dL — ABNORMAL HIGH (ref 70–99)
Glucose-Capillary: 131 mg/dL — ABNORMAL HIGH (ref 70–99)
Glucose-Capillary: 136 mg/dL — ABNORMAL HIGH (ref 70–99)
Glucose-Capillary: 137 mg/dL — ABNORMAL HIGH (ref 70–99)
Glucose-Capillary: 138 mg/dL — ABNORMAL HIGH (ref 70–99)
Glucose-Capillary: 138 mg/dL — ABNORMAL HIGH (ref 70–99)
Glucose-Capillary: 139 mg/dL — ABNORMAL HIGH (ref 70–99)
Glucose-Capillary: 142 mg/dL — ABNORMAL HIGH (ref 70–99)
Glucose-Capillary: 144 mg/dL — ABNORMAL HIGH (ref 70–99)
Glucose-Capillary: 148 mg/dL — ABNORMAL HIGH (ref 70–99)
Glucose-Capillary: 151 mg/dL — ABNORMAL HIGH (ref 70–99)
Glucose-Capillary: 153 mg/dL — ABNORMAL HIGH (ref 70–99)
Glucose-Capillary: 155 mg/dL — ABNORMAL HIGH (ref 70–99)
Glucose-Capillary: 160 mg/dL — ABNORMAL HIGH (ref 70–99)
Glucose-Capillary: 169 mg/dL — ABNORMAL HIGH (ref 70–99)
Glucose-Capillary: 171 mg/dL — ABNORMAL HIGH (ref 70–99)
Glucose-Capillary: 171 mg/dL — ABNORMAL HIGH (ref 70–99)
Glucose-Capillary: 173 mg/dL — ABNORMAL HIGH (ref 70–99)
Glucose-Capillary: 173 mg/dL — ABNORMAL HIGH (ref 70–99)
Glucose-Capillary: 174 mg/dL — ABNORMAL HIGH (ref 70–99)
Glucose-Capillary: 178 mg/dL — ABNORMAL HIGH (ref 70–99)
Glucose-Capillary: 186 mg/dL — ABNORMAL HIGH (ref 70–99)
Glucose-Capillary: 191 mg/dL — ABNORMAL HIGH (ref 70–99)
Glucose-Capillary: 193 mg/dL — ABNORMAL HIGH (ref 70–99)
Glucose-Capillary: 204 mg/dL — ABNORMAL HIGH (ref 70–99)
Glucose-Capillary: 207 mg/dL — ABNORMAL HIGH (ref 70–99)
Glucose-Capillary: 209 mg/dL — ABNORMAL HIGH (ref 70–99)
Glucose-Capillary: 211 mg/dL — ABNORMAL HIGH (ref 70–99)
Glucose-Capillary: 218 mg/dL — ABNORMAL HIGH (ref 70–99)
Glucose-Capillary: 220 mg/dL — ABNORMAL HIGH (ref 70–99)
Glucose-Capillary: 223 mg/dL — ABNORMAL HIGH (ref 70–99)
Glucose-Capillary: 228 mg/dL — ABNORMAL HIGH (ref 70–99)
Glucose-Capillary: 248 mg/dL — ABNORMAL HIGH (ref 70–99)
Glucose-Capillary: 264 mg/dL — ABNORMAL HIGH (ref 70–99)
Glucose-Capillary: 273 mg/dL — ABNORMAL HIGH (ref 70–99)
Glucose-Capillary: 274 mg/dL — ABNORMAL HIGH (ref 70–99)
Glucose-Capillary: 276 mg/dL — ABNORMAL HIGH (ref 70–99)
Glucose-Capillary: 350 mg/dL — ABNORMAL HIGH (ref 70–99)
Glucose-Capillary: 368 mg/dL — ABNORMAL HIGH (ref 70–99)
Glucose-Capillary: 371 mg/dL — ABNORMAL HIGH (ref 70–99)
Glucose-Capillary: 388 mg/dL — ABNORMAL HIGH (ref 70–99)
Glucose-Capillary: 400 mg/dL — ABNORMAL HIGH (ref 70–99)
Glucose-Capillary: 427 mg/dL — ABNORMAL HIGH (ref 70–99)
Glucose-Capillary: 90 mg/dL (ref 70–99)
Glucose-Capillary: 96 mg/dL (ref 70–99)
Glucose-Capillary: 97 mg/dL (ref 70–99)
Glucose-Capillary: 97 mg/dL (ref 70–99)

## 2011-01-18 LAB — DIFFERENTIAL
Band Neutrophils: 0 % (ref 0–10)
Basophils Absolute: 0 10*3/uL (ref 0.0–0.1)
Basophils Absolute: 0 10*3/uL (ref 0.0–0.1)
Basophils Relative: 0 % (ref 0–1)
Basophils Relative: 0 % (ref 0–1)
Basophils Relative: 0 % (ref 0–1)
Basophils Relative: 0 % (ref 0–1)
Basophils Relative: 0 % (ref 0–1)
Eosinophils Absolute: 0 10*3/uL (ref 0.0–0.7)
Eosinophils Absolute: 0 10*3/uL (ref 0.0–0.7)
Eosinophils Absolute: 0 10*3/uL (ref 0.0–0.7)
Eosinophils Absolute: 0 10*3/uL (ref 0.0–0.7)
Eosinophils Absolute: 0 10*3/uL (ref 0.0–0.7)
Eosinophils Relative: 0 % (ref 0–5)
Eosinophils Relative: 0 % (ref 0–5)
Eosinophils Relative: 0 % (ref 0–5)
Eosinophils Relative: 0 % (ref 0–5)
Eosinophils Relative: 0 % (ref 0–5)
Eosinophils Relative: 0 % (ref 0–5)
Lymphocytes Relative: 0 % — ABNORMAL LOW (ref 12–46)
Lymphocytes Relative: 17 % (ref 12–46)
Lymphocytes Relative: 17 % (ref 12–46)
Lymphocytes Relative: 25 % (ref 12–46)
Lymphocytes Relative: 30 % (ref 12–46)
Lymphocytes Relative: 38 % (ref 12–46)
Lymphs Abs: 0 10*3/uL — ABNORMAL LOW (ref 0.7–4.0)
Lymphs Abs: 0.5 10*3/uL — ABNORMAL LOW (ref 0.7–4.0)
Lymphs Abs: 0.7 10*3/uL (ref 0.7–4.0)
Lymphs Abs: 0.8 10*3/uL (ref 0.7–4.0)
Lymphs Abs: 0.9 10*3/uL (ref 0.7–4.0)
Monocytes Absolute: 0 10*3/uL — ABNORMAL LOW (ref 0.1–1.0)
Monocytes Absolute: 0 10*3/uL — ABNORMAL LOW (ref 0.1–1.0)
Monocytes Absolute: 0.7 10*3/uL (ref 0.1–1.0)
Monocytes Absolute: 1.4 10*3/uL — ABNORMAL HIGH (ref 0.1–1.0)
Monocytes Relative: 19 % — ABNORMAL HIGH (ref 3–12)
Monocytes Relative: 21 % — ABNORMAL HIGH (ref 3–12)
Monocytes Relative: 27 % — ABNORMAL HIGH (ref 3–12)
Monocytes Relative: 27 % — ABNORMAL HIGH (ref 3–12)
Myelocytes: 0 %
Neutro Abs: 0 10*3/uL — ABNORMAL LOW (ref 1.7–7.7)
Neutro Abs: 1.1 10*3/uL — ABNORMAL LOW (ref 1.7–7.7)
Neutro Abs: 1.8 10*3/uL (ref 1.7–7.7)
Neutro Abs: 1.8 10*3/uL (ref 1.7–7.7)
Neutro Abs: 2.9 10*3/uL (ref 1.7–7.7)
Neutrophils Relative %: 0 % — ABNORMAL LOW (ref 43–77)
Neutrophils Relative %: 35 % — ABNORMAL LOW (ref 43–77)
Neutrophils Relative %: 49 % (ref 43–77)
Neutrophils Relative %: 52 % (ref 43–77)
Neutrophils Relative %: 53 % (ref 43–77)
Neutrophils Relative %: 54 % (ref 43–77)
Neutrophils Relative %: 78 % — ABNORMAL HIGH (ref 43–77)
Promyelocytes Absolute: 0 %
Smear Review: ADEQUATE
nRBC: 0 /100 WBC

## 2011-01-18 LAB — CROSSMATCH
ABO/RH(D): A POS
Antibody Screen: NEGATIVE
Antibody Screen: NEGATIVE

## 2011-01-18 LAB — CULTURE, BLOOD (ROUTINE X 2)
Culture: NO GROWTH
Culture: NO GROWTH
Culture: NO GROWTH
Culture: NO GROWTH
Culture: NO GROWTH

## 2011-01-18 LAB — COMPREHENSIVE METABOLIC PANEL
ALT: 107 U/L — ABNORMAL HIGH (ref 0–35)
ALT: 22 U/L (ref 0–35)
ALT: 33 U/L (ref 0–35)
ALT: 34 U/L (ref 0–35)
AST: 22 U/L (ref 0–37)
AST: 34 U/L (ref 0–37)
AST: 42 U/L — ABNORMAL HIGH (ref 0–37)
AST: 69 U/L — ABNORMAL HIGH (ref 0–37)
Albumin: 1.7 g/dL — ABNORMAL LOW (ref 3.5–5.2)
Albumin: 1.9 g/dL — ABNORMAL LOW (ref 3.5–5.2)
Albumin: 1.9 g/dL — ABNORMAL LOW (ref 3.5–5.2)
Albumin: 2.9 g/dL — ABNORMAL LOW (ref 3.5–5.2)
Alkaline Phosphatase: 105 U/L (ref 39–117)
Alkaline Phosphatase: 123 U/L — ABNORMAL HIGH (ref 39–117)
Alkaline Phosphatase: 63 U/L (ref 39–117)
Alkaline Phosphatase: 64 U/L (ref 39–117)
BUN: 16 mg/dL (ref 6–23)
BUN: 19 mg/dL (ref 6–23)
BUN: 22 mg/dL (ref 6–23)
BUN: 30 mg/dL — ABNORMAL HIGH (ref 6–23)
BUN: 38 mg/dL — ABNORMAL HIGH (ref 6–23)
BUN: 47 mg/dL — ABNORMAL HIGH (ref 6–23)
CO2: 26 mEq/L (ref 19–32)
CO2: 27 mEq/L (ref 19–32)
CO2: 35 mEq/L — ABNORMAL HIGH (ref 19–32)
Calcium: 7.8 mg/dL — ABNORMAL LOW (ref 8.4–10.5)
Calcium: 8 mg/dL — ABNORMAL LOW (ref 8.4–10.5)
Calcium: 8.1 mg/dL — ABNORMAL LOW (ref 8.4–10.5)
Chloride: 101 mEq/L (ref 96–112)
Chloride: 101 mEq/L (ref 96–112)
Chloride: 94 mEq/L — ABNORMAL LOW (ref 96–112)
Chloride: 98 mEq/L (ref 96–112)
Creatinine, Ser: 0.71 mg/dL (ref 0.4–1.2)
Creatinine, Ser: 0.97 mg/dL (ref 0.4–1.2)
Creatinine, Ser: 1.13 mg/dL (ref 0.4–1.2)
Creatinine, Ser: 2.04 mg/dL — ABNORMAL HIGH (ref 0.4–1.2)
GFR calc Af Amer: 29 mL/min — ABNORMAL LOW (ref >60)
GFR calc Af Amer: >60 mL/min (ref >60)
GFR calc Af Amer: >60 mL/min (ref >60)
GFR calc non Af Amer: 48 mL/min — ABNORMAL LOW (ref >60)
GFR calc non Af Amer: 57 mL/min — ABNORMAL LOW (ref >60)
GFR calc non Af Amer: >60 mL/min (ref >60)
Glucose, Bld: 206 mg/dL — ABNORMAL HIGH (ref 70–99)
Glucose, Bld: 282 mg/dL — ABNORMAL HIGH (ref 70–99)
Potassium: 3.6 mEq/L (ref 3.5–5.1)
Potassium: 3.9 mEq/L (ref 3.5–5.1)
Potassium: 4.1 mEq/L (ref 3.5–5.1)
Potassium: 4.3 mEq/L (ref 3.5–5.1)
Sodium: 136 mEq/L (ref 135–145)
Sodium: 136 mEq/L (ref 135–145)
Sodium: 137 mEq/L (ref 135–145)
Sodium: 138 mEq/L (ref 135–145)
Total Bilirubin: 0.8 mg/dL (ref 0.3–1.2)
Total Bilirubin: 1 mg/dL (ref 0.3–1.2)
Total Bilirubin: 1.1 mg/dL (ref 0.3–1.2)
Total Bilirubin: 1.2 mg/dL (ref 0.3–1.2)
Total Bilirubin: 1.4 mg/dL — ABNORMAL HIGH (ref 0.3–1.2)
Total Protein: 5.1 g/dL — ABNORMAL LOW (ref 6.0–8.3)
Total Protein: 5.2 g/dL — ABNORMAL LOW (ref 6.0–8.3)
Total Protein: 5.3 g/dL — ABNORMAL LOW (ref 6.0–8.3)
Total Protein: 6.8 g/dL (ref 6.0–8.3)

## 2011-01-18 LAB — URINE CULTURE
Colony Count: >=100000
Culture: NO GROWTH
Culture: NO GROWTH
Special Requests: NEGATIVE
Special Requests: NEGATIVE

## 2011-01-18 LAB — BASIC METABOLIC PANEL
BUN: 19 mg/dL (ref 6–23)
BUN: 20 mg/dL (ref 6–23)
BUN: 23 mg/dL (ref 6–23)
BUN: 26 mg/dL — ABNORMAL HIGH (ref 6–23)
BUN: 41 mg/dL — ABNORMAL HIGH (ref 6–23)
CO2: 21 mEq/L (ref 19–32)
CO2: 23 mEq/L (ref 19–32)
CO2: 24 mEq/L (ref 19–32)
CO2: 29 mEq/L (ref 19–32)
CO2: 29 mEq/L (ref 19–32)
CO2: 30 mEq/L (ref 19–32)
Calcium: 7.5 mg/dL — ABNORMAL LOW (ref 8.4–10.5)
Calcium: 7.6 mg/dL — ABNORMAL LOW (ref 8.4–10.5)
Calcium: 7.9 mg/dL — ABNORMAL LOW (ref 8.4–10.5)
Calcium: 8 mg/dL — ABNORMAL LOW (ref 8.4–10.5)
Calcium: 8.1 mg/dL — ABNORMAL LOW (ref 8.4–10.5)
Calcium: 8.2 mg/dL — ABNORMAL LOW (ref 8.4–10.5)
Chloride: 100 mEq/L (ref 96–112)
Chloride: 106 mEq/L (ref 96–112)
Chloride: 109 mEq/L (ref 96–112)
Chloride: 110 mEq/L (ref 96–112)
Creatinine, Ser: 0.91 mg/dL (ref 0.4–1.2)
Creatinine, Ser: 0.94 mg/dL (ref 0.4–1.2)
Creatinine, Ser: 1.01 mg/dL (ref 0.4–1.2)
Creatinine, Ser: 1.04 mg/dL (ref 0.4–1.2)
Creatinine, Ser: 1.06 mg/dL (ref 0.4–1.2)
Creatinine, Ser: 1.2 mg/dL (ref 0.4–1.2)
Creatinine, Ser: 1.22 mg/dL — ABNORMAL HIGH (ref 0.4–1.2)
GFR calc Af Amer: 53 mL/min — ABNORMAL LOW (ref >60)
GFR calc Af Amer: 54 mL/min — ABNORMAL LOW (ref >60)
GFR calc Af Amer: >60 mL/min (ref >60)
GFR calc Af Amer: >60 mL/min (ref >60)
GFR calc Af Amer: >60 mL/min (ref >60)
GFR calc Af Amer: >60 mL/min (ref >60)
GFR calc Af Amer: >60 mL/min (ref >60)
GFR calc non Af Amer: 41 mL/min — ABNORMAL LOW (ref >60)
GFR calc non Af Amer: 44 mL/min — ABNORMAL LOW (ref >60)
GFR calc non Af Amer: 52 mL/min — ABNORMAL LOW (ref >60)
GFR calc non Af Amer: 57 mL/min — ABNORMAL LOW (ref >60)
GFR calc non Af Amer: 59 mL/min — ABNORMAL LOW (ref >60)
Glucose, Bld: 140 mg/dL — ABNORMAL HIGH (ref 70–99)
Glucose, Bld: 94 mg/dL (ref 70–99)
Potassium: 3.7 mEq/L (ref 3.5–5.1)
Potassium: 3.9 mEq/L (ref 3.5–5.1)
Potassium: 3.9 mEq/L (ref 3.5–5.1)
Potassium: 3.9 mEq/L (ref 3.5–5.1)
Sodium: 135 mEq/L (ref 135–145)
Sodium: 137 mEq/L (ref 135–145)
Sodium: 139 mEq/L (ref 135–145)

## 2011-01-18 LAB — UIFE/LIGHT CHAINS/TP QN, 24-HR UR
Alpha 1, Urine: DETECTED — AB
Alpha 2, Urine: DETECTED — AB
Beta, Urine: DETECTED — AB
Total Protein, Urine: 10.6 mg/dL

## 2011-01-18 LAB — URINALYSIS, ROUTINE W REFLEX MICROSCOPIC
Bilirubin Urine: NEGATIVE
Bilirubin Urine: NEGATIVE
Glucose, UA: 100 mg/dL — AB
Glucose, UA: NEGATIVE mg/dL
Glucose, UA: NEGATIVE mg/dL
Glucose, UA: NEGATIVE mg/dL
Glucose, UA: NEGATIVE mg/dL
Hgb urine dipstick: NEGATIVE
Hgb urine dipstick: NEGATIVE
Hgb urine dipstick: NEGATIVE
Ketones, ur: NEGATIVE mg/dL
Ketones, ur: NEGATIVE mg/dL
Leukocytes, UA: NEGATIVE
Nitrite: NEGATIVE
Protein, ur: 100 mg/dL — AB
Protein, ur: 30 mg/dL — AB
Protein, ur: NEGATIVE mg/dL
Specific Gravity, Urine: 1.012 (ref 1.005–1.030)
Specific Gravity, Urine: 1.03 (ref 1.005–1.030)
Urobilinogen, UA: 0.2 mg/dL (ref 0.0–1.0)
Urobilinogen, UA: 1 mg/dL (ref 0.0–1.0)
Urobilinogen, UA: 1 mg/dL (ref 0.0–1.0)
pH: 5 (ref 5.0–8.0)
pH: 5 (ref 5.0–8.0)
pH: 7 (ref 5.0–8.0)

## 2011-01-18 LAB — URINE MICROSCOPIC-ADD ON

## 2011-01-18 LAB — CARDIAC PANEL(CRET KIN+CKTOT+MB+TROPI)
CK, MB: 0.4 ng/mL (ref 0.3–4.0)
Relative Index: INVALID (ref 0.0–2.5)
Relative Index: INVALID (ref 0.0–2.5)
Total CK: 12 U/L (ref 7–177)
Total CK: 31 U/L (ref 7–177)
Troponin I: 0.03 ng/mL (ref 0.00–0.06)
Troponin I: 0.04 ng/mL (ref 0.00–0.06)
Troponin I: 0.05 ng/mL (ref 0.00–0.06)

## 2011-01-18 LAB — MRSA PCR SCREENING: MRSA by PCR: NEGATIVE

## 2011-01-18 LAB — HEMOCCULT GUIAC POC 1CARD (OFFICE): Fecal Occult Bld: NEGATIVE

## 2011-01-18 LAB — PROTEIN ELECTROPH W RFLX QUANT IMMUNOGLOBULINS
Albumin ELP: 46.4 % — ABNORMAL LOW (ref 55.8–66.1)
Alpha-1-Globulin: 9.6 % — ABNORMAL HIGH (ref 2.9–4.9)
Alpha-2-Globulin: 17.7 % — ABNORMAL HIGH (ref 7.1–11.8)

## 2011-01-18 LAB — PHOSPHORUS
Phosphorus: 3.2 mg/dL (ref 2.3–4.6)
Phosphorus: 3.6 mg/dL (ref 2.3–4.6)

## 2011-01-18 LAB — GLIADIN ANTIBODIES, SERUM
Gliadin IgA: 0.8 U/mL (ref <7)
Gliadin IgG: 0.3 U/mL (ref <7)

## 2011-01-18 LAB — FERRITIN: Ferritin: 968 ng/mL — ABNORMAL HIGH (ref 10–291)

## 2011-01-18 LAB — BRAIN NATRIURETIC PEPTIDE
Pro B Natriuretic peptide (BNP): 103 pg/mL — ABNORMAL HIGH (ref 0.0–100.0)
Pro B Natriuretic peptide (BNP): 139 pg/mL — ABNORMAL HIGH (ref 0.0–100.0)
Pro B Natriuretic peptide (BNP): 76.4 pg/mL (ref 0.0–100.0)

## 2011-01-18 LAB — DIRECT ANTIGLOBULIN TEST (NOT AT ARMC)
DAT, IgG: NEGATIVE
DAT, complement: NEGATIVE

## 2011-01-18 LAB — RETICULOCYTES
RBC.: 3.12 MIL/uL — ABNORMAL LOW (ref 3.87–5.11)
Retic Count, Absolute: 49.9 10*3/uL (ref 19.0–186.0)

## 2011-01-18 LAB — TISSUE TRANSGLUTAMINASE, IGA: Tissue Transglutaminase Ab, IgA: 0.3 U/mL (ref <7)

## 2011-01-18 LAB — CK: Total CK: 14 U/L (ref 7–177)

## 2011-01-18 LAB — OCCULT BLOOD (STOOL CUP TO LAB): Fecal Occult Bld: NEGATIVE

## 2011-01-18 LAB — MAGNESIUM
Magnesium: 2.2 mg/dL (ref 1.5–2.5)
Magnesium: 2.6 mg/dL — ABNORMAL HIGH (ref 1.5–2.5)

## 2011-01-18 LAB — CREATININE, URINE, RANDOM: Creatinine, Urine: 54.6 mg/dL

## 2011-01-18 LAB — TECHNOLOGIST SMEAR REVIEW

## 2011-01-18 LAB — CHROMOSOME ANALYSIS, BONE MARROW

## 2011-01-18 LAB — URIC ACID: Uric Acid, Serum: 7.3 mg/dL — ABNORMAL HIGH (ref 2.4–7.0)

## 2011-01-18 LAB — LACTATE DEHYDROGENASE: LDH: 326 U/L — ABNORMAL HIGH (ref 94–250)

## 2011-01-18 LAB — IRON AND TIBC: TIBC: 210 ug/dL — ABNORMAL LOW (ref 250–470)

## 2011-01-18 LAB — GLUCOSE, RANDOM: Glucose, Bld: 406 mg/dL — ABNORMAL HIGH (ref 70–99)

## 2011-01-18 LAB — BETA 2 MICROGLOBULIN, SERUM: Beta-2 Microglobulin: 5.18 mg/L — ABNORMAL HIGH (ref 1.01–1.73)

## 2011-01-18 LAB — PATHOLOGIST SMEAR REVIEW

## 2011-01-18 LAB — PREPARE RBC (CROSSMATCH)

## 2011-01-18 LAB — FOLATE RBC: RBC Folate: 1050 ng/mL — ABNORMAL HIGH (ref 180–600)

## 2011-01-18 LAB — IMMUNOFIXATION ADD-ON

## 2011-01-18 LAB — SODIUM, URINE, RANDOM: Sodium, Ur: 116 mEq/L

## 2011-01-24 ENCOUNTER — Other Ambulatory Visit: Payer: Self-pay | Admitting: Hematology & Oncology

## 2011-01-24 ENCOUNTER — Encounter (HOSPITAL_BASED_OUTPATIENT_CLINIC_OR_DEPARTMENT_OTHER): Payer: Medicare Other | Admitting: Hematology & Oncology

## 2011-01-24 DIAGNOSIS — Z5111 Encounter for antineoplastic chemotherapy: Secondary | ICD-10-CM

## 2011-01-24 DIAGNOSIS — C8590 Non-Hodgkin lymphoma, unspecified, unspecified site: Secondary | ICD-10-CM

## 2011-01-24 DIAGNOSIS — D649 Anemia, unspecified: Secondary | ICD-10-CM

## 2011-01-24 DIAGNOSIS — D6189 Other specified aplastic anemias and other bone marrow failure syndromes: Secondary | ICD-10-CM

## 2011-01-24 DIAGNOSIS — Z452 Encounter for adjustment and management of vascular access device: Secondary | ICD-10-CM

## 2011-01-24 DIAGNOSIS — C8589 Other specified types of non-Hodgkin lymphoma, extranodal and solid organ sites: Secondary | ICD-10-CM

## 2011-01-24 LAB — COMPREHENSIVE METABOLIC PANEL
ALT: 13 U/L (ref 0–35)
BUN: 26 mg/dL — ABNORMAL HIGH (ref 6–23)
CO2: 23 mEq/L (ref 19–32)
Calcium: 9.5 mg/dL (ref 8.4–10.5)
Chloride: 104 mEq/L (ref 96–112)
Creatinine, Ser: 1.52 mg/dL — ABNORMAL HIGH (ref 0.40–1.20)
Glucose, Bld: 102 mg/dL — ABNORMAL HIGH (ref 70–99)
Total Bilirubin: 0.4 mg/dL (ref 0.3–1.2)

## 2011-01-24 LAB — CBC WITH DIFFERENTIAL (CANCER CENTER ONLY)
BASO#: 0 10*3/uL (ref 0.0–0.2)
BASO%: 0.2 % (ref 0.0–2.0)
Eosinophils Absolute: 0.1 10*3/uL (ref 0.0–0.5)
HCT: 34.1 % — ABNORMAL LOW (ref 34.8–46.6)
HGB: 11.9 g/dL (ref 11.6–15.9)
LYMPH#: 2.4 10*3/uL (ref 0.9–3.3)
LYMPH%: 56.5 % — ABNORMAL HIGH (ref 14.0–48.0)
MCV: 91 fL (ref 81–101)
MONO#: 0.4 10*3/uL (ref 0.1–0.9)
NEUT%: 30.4 % — ABNORMAL LOW (ref 39.6–80.0)
RDW: 12.7 % (ref 11.1–15.7)
WBC: 4.2 10*3/uL (ref 3.9–10.0)

## 2011-01-24 LAB — LACTATE DEHYDROGENASE: LDH: 151 U/L (ref 94–250)

## 2011-02-08 LAB — DIFFERENTIAL
Basophils Absolute: 0 10*3/uL (ref 0.0–0.1)
Lymphocytes Relative: 40 % (ref 12–46)
Lymphs Abs: 3.2 10*3/uL (ref 0.7–4.0)
Monocytes Absolute: 0.8 10*3/uL (ref 0.1–1.0)
Neutro Abs: 3.9 10*3/uL (ref 1.7–7.7)

## 2011-02-08 LAB — POCT I-STAT, CHEM 8
BUN: 25 mg/dL — ABNORMAL HIGH (ref 6–23)
Calcium, Ion: 0.93 mmol/L — ABNORMAL LOW (ref 1.12–1.32)
Chloride: 107 mEq/L (ref 96–112)
Creatinine, Ser: 1.2 mg/dL (ref 0.4–1.2)
Sodium: 139 mEq/L (ref 135–145)

## 2011-02-08 LAB — CBC
HCT: 33.8 % — ABNORMAL LOW (ref 36.0–46.0)
Hemoglobin: 11.9 g/dL — ABNORMAL LOW (ref 12.0–15.0)
RDW: 14.7 % (ref 11.5–15.5)
WBC: 8.1 10*3/uL (ref 4.0–10.5)

## 2011-02-08 LAB — POCT CARDIAC MARKERS
Myoglobin, poc: 314 ng/mL (ref 12–200)
Myoglobin, poc: 319 ng/mL (ref 12–200)
Troponin i, poc: <0.05 ng/mL (ref 0.00–0.09)

## 2011-03-13 NOTE — Consult Note (Signed)
[name], [name] NO.:  [id number]   MEDICAL RECORD NO.:  [id number]          PATIENT TYPE:  INP   LOCATION:  3710                         FACILITY:  [facility]   PHYSICIAN:  Armanda Magic, M.D.     DATE OF BIRTH:  04-Jan-1941   DATE OF CONSULTATION:  06/29/2007  DATE OF DISCHARGE:                                 CONSULTATION   PRIMARY CARDIOLOGIST:  Francisca December, M.D.   CHIEF COMPLAINT:  Chest pain.   HISTORY OF PRESENT ILLNESS:  This is a 70 year old obese white female  with history of nonobstructive coronary disease by catheterization in  January 2001 who was admitted with atypical chest pain.  She awakened  yesterday morning with burning sensation and pressure in her midsternal  area, with radiation to the back.  She denied any radiation down her  arms or numbness and tingling.  This was associated with nausea, but no  shortness of breath or diaphoresis.  It lasted about half an hour to an  hour and then was relieved in the emergency room.  She is now pain free.  We are now asked to evaluate.  She does have a recent history increasing  dyspnea on exertion but is fairly sedentary.  Of note, she was found be  in rapid atrial fibrillation on admission to the hospital.   PAST MEDICAL HISTORY:  1  Diabetes mellitus.  1. Hypertension.  2. Nonobstructive coronary artery disease.  3. Dyslipidemia.  4. Anxiety, with panic attacks.   PAST SURGICAL HISTORY:  Total abdominal hysterectomy with BSO secondary  to fibroids.   SOCIAL HISTORY:  Her father died of an MI.  He had diabetes.  Her  brother has coronary disease and is status post CABG.  He has diabetes.  She has a sister with emphysema.   SOCIAL HISTORY:  She is married.  She has three children.  She is  retired.  She denies any tobacco use.   MEDICATIONS:  1. Actos 30 mg a day.  2. Aspirin 81 mg a day.  3. Avapro 300 mg a day.  4. Calcium 600 mg a day.  5. Clarinex 5 mg a day.  6. Clonidine 0.3 mg  b.i.d.  7. Felodipine ER 10 mg a day,.  8. Glimepiride 4 mg a day.  9. HCTZ 25 mg a day.  10.Lisinopril 40 mg a day.  11.Metformin ER 5 mg 4 tablets daily.  12.Protonix 4 mg a day.  13.Pravachol 40 mg a day.  14.Lovenox 40 mg b.i.d. in the hospital.  15.Lopressor 25 mg b.i.d.  16.Xanax 0.5 mg q.6 h. p.r.n.   REVIEW OF SYSTEMS:  Otherwise is negative.   PHYSICAL EXAMINATION:  VITAL SIGNS:  Blood pressure is 125/67, heart  rate 61, respirations 16, O2 saturations 97% on 2 liters.  HEENT:  Benign.  NECK:  Supple, without lymphadenopathy.  Carotid upstrokes +2  bilaterally, without bruits.  LUNGS:  Clear to auscultation throughout.  HEART:  Regular rate and rhythm.  No murmurs, rubs, or gallops.  Normal  S1, S2.  ABDOMEN:  Benign.  EXTREMITIES:  No edema.  LABORATORIES:  Her white cell count is 8.9, hemoglobin 11.9, hematocrit  is 34.4, platelet count 304.  TSH 2.612.  Hemoglobin A1c 6.7.  CPK 71,  79, 82.  MBs 2.5, 2.9, 2.8.  Troponin elevated at 0.1, 0.14 and 0.1.  EKG on immediate presentation to the ER showed rapid atrial fibrillation  at 171 beats per minute, with diffuse ST-segment depression  inferolaterally.  This was at 4:00 a.m.  Repeat EKG several hours later  showed sinus rhythm, with an occasional PVC.  Also in lab work, on BMET,  sodium was 138, potassium 4.1, chloride 107, glucose 171, BUN 34, and  creatinine was 1.1. Chest x-ray showed no acute cardiopulmonary  findings.   ASSESSMENT:  1. Atypical chest pain, most consistent with gastroesophageal reflux      disease, but could been induced by the rapid atrial fibrillation.      Her troponin is slightly elevated.  It could again be due to      paroxysmal atrial fibrillation with rapid ventricular response.  2. Rapid atrial fibrillation on presentation, now in sinus rhythm.      She did have diffuse ST depression during the tachycardia.  3. ST depression a rapid atrial fibrillation, with a history of       nonobstructive coronary disease.  4. Hypertension.  5. Diabetes.  6. Panic attacks.   PLAN:  Check a D-dimer to rule out PE.  Stress Cardiolite study on  July 01, 2007 per Dr. Amil Amen.  Continue Lopressor for rate control.  Continue aspirin and Lovenox for now.      Armanda Magic, M.D.  Electronically Signed     TT/MEDQ  D:  06/29/2007  T:  06/30/2007  Job:  [id number]   cc:   Francisca December, M.D.  [name], M.D.

## 2011-03-13 NOTE — Discharge Summary (Signed)
[name], Diana NO.:  [id number]   MEDICAL RECORD NO.:  [id number]          PATIENT TYPE:  INP   LOCATION:  3710                         FACILITY:  [facility]   PHYSICIAN:  Barnetta Chapel, MDDATE OF BIRTH:  10/12/1941   DATE OF ADMISSION:  06/28/2007  DATE OF DISCHARGE:  07/01/2007                               DISCHARGE SUMMARY   PRIMARY CARE [name]:  Dr. Shaune [name].   DIAGNOSES:  1. Chest pain, atypical, suspect secondary to gastroesophageal reflux      disease.  2. Hypertension.  3. Diabetes mellitus.  4. History of coronary artery disease.   DISCHARGE MEDICATION:  1. Sublingual nitro 0.4 mg p.r.n.  2. Toprol XL 50 mg p.o. one daily.  3. Pravachol 40 mg p.o. one daily.  4. HCTZ 25 mg p.o. one daily.  5. Clonidine 0.3 mg p.o. twice daily.  6. Metformin ER 2000 mg p.o. q.h.s.  7. Actos 30 mg p.o. one daily.  8. Xanax 0.25 mg p.o. q.h.s.  9. Felodipine ER 10 mg p.o. q.h.s.  10.Nexium 40 mg p.o. one daily.  11.Lisinopril 40 mg p.o. one daily.  12.Avapro 300 mg p.o. one daily.  13.Amaryl 4 mg p.o. one daily.  14.Clarinex 5 mg p.o. p.r.n.  15.Meclizine 25 mg p.o. q.6 hourly as needed.  16.Ultra mega Multivit one pack p.o. one daily.  17.Calcium 600 mg  p.o. one daily.  18.Fish oil 1000 mg p.o. one daily.  19.Aspirin 81 mg p.o. one daily.   CONSULTATIONS DONE:  Include cardiology consult. The patient was seen by  Dr. Armanda [name] and the patient eventually had a cardiac stress test  which was said to be negative.   PERTINENT LABS:  The cardiac enzymes were all negative.   BRIEF HISTORY AND HOSPITAL COURSE:  Please refer to the H&P done on  June 28, 2007.  [name] is a 70 year old female with past medical  history significant for hypertension, diabetes, hyperlipidemia, anxiety,  and coronary artery disease.  The patient presented with chest pain.  The patient was admitted to the telemetry floor for workup and  management of the chest  pain.  Cardiac enzymes were all negative.  Considering a prior history of cardiac disease, and the risk factors for  coronary artery disease, cardiology consult was called for further  cardiac workup.  The patient was seen by Dr.  Armanda [name].  The patient  had a cardiac stress test which was said to be negative.  The  cardiologist felt that the chest pain was likely secondary to GERD.  The  patient was managed with a PPI and other cardiac meds.  The patient has  remained chest pain free.  The patient is back to her normal self.   DISCHARGE PLAN:  1. Discharge the patient home on above meds.  2. Follow up with the primary care [name], Dr. Shaune [name] in a      week.  3. Follow up with cardiologist, Dr. Ty [name].  4. ADA diet 1800 Kcals.  5. Activity as tolerated.      Barnetta Chapel, MD  Electronically Signed  SIO/MEDQ  D:  07/01/2007  T:  07/01/2007  Job:  [id number]   cc:   [name], M.D.  [name], M.D.

## 2011-03-13 NOTE — H&P (Signed)
[name], LAIR NO.:  [id number]   MEDICAL RECORD NO.:  [id number]          PATIENT TYPE:  EMS   LOCATION:  MAJO                         FACILITY:  [facility]   PHYSICIAN:  Rosalyn Gess. Norins, MD  DATE OF BIRTH:  [birth year]   DATE OF ADMISSION:  06/28/2007  DATE OF DISCHARGE:                              HISTORY & PHYSICAL   ADMITTING PRESENTATION:  Chest pain.   HISTORY OF PRESENT ILLNESS:  [name] is a 70 year old married white  female who was awakened at about 2:00 a.m. by substernal chest pain  described as a heavy pressure-like discomfort.  She denies any severe  shortness of breath or diaphoresis.  She had no radiation of pain or  discomfort to her neck or arm.  She did notice she had a very rapid  heart rate.  The patient was brought to [facility] Emergency Department  for evaluation.  At the time of my evaluation she was pain-free.   The patient has a prior history of chest discomfort, was told she had a  panic attack.  She did come to cardiac catheterization in 2005, which  revealed that she had noncritical disease with a left anterior  descending artery giving rise to a bifurcating D1 and a moderate size  perforator.  The D1 had a 50-60% stenosis.  First septal perforator had  20-30% stenosis.  She had 20-30% mid circumflex lesion.  The patient had  a 20-30% ostial lesion of the RCA.  She has had no further cardiac  evaluation and really has not been bothered by any angina or cardiac  symptoms.   In the emergency department, the patient had atrial fibrillation with  rapid ventricular response.  Her EKG did reveal what appeared to be  ischemic changes with ST depression in leads V3-V5.  She did convert to  normal sinus rhythm with IV Lopressor.  Patient is now admitted to rule  out for MI and to monitor her rhythm.   PAST SURGICAL HISTORY:  1. Appendectomy remote.  2. TAH-BSO secondary to uterine fibroids.   MEDICAL ILLNESSES:  1. Usual  childhood disease.  2. Hypertension.  3. Diabetes.  4. Hyperlipidemia.  5. Anxiety.   GYNECOLOGICAL HISTORY:  Gravida 3, para 3 with normal spontaneous  vaginal deliveries.   MEDICATIONS:  1. Actos 30 mg daily.  2. Alprazolam 0.5 mg nightly.  3. Aspirin 81 mg daily.  4. Avapro 300 mg daily.  5. Calcium 600 mg daily.  6. Clarinex 5 mg daily.  7. Clonidine 0.3 mg b.i.d.  8. Felodipine 10 mg ER form daily.  9. Glimepiride 4 mg daily.  10.Hydrochlorothiazide 25 mg daily.  11.Lisinopril 40 mg daily.  12.Meclizine 25 mg q.6h. p.r.n.  13.Metformin XR 500 mg 4 tablets q.p.m.  14.Multivitamins.  15.Nexium 40 mg q.a.m.  16.Pravachol 40 mg daily.  17.The patient reports she is also taking red yeast rice.   HABITS:  Tobacco none.  Alcohol none.   FAMILY HISTORY:  Positive for diabetes in her father.  He also died of a  massive MI.  Brother has CAD status post bypass surgery.  He also had  diabetes.  Sister with emphysema.  Grandfather with brown lung disease.   SOCIAL HISTORY:  The patient is married 48 years.  She has one daughter  and two sons.  She has four grandchildren.  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  She reports her marriage is in good health.   REVIEW OF SYSTEMS:  Negative except for anxiety attacks and the HPI.  No  other cardiovascular complaints, no GI complaints.  No GYN complaints.   PHYSICAL EXAMINATION:  VITAL SIGNS:  Temperature 98.7, blood pressure  165/83, heart rate 67, respirations 22.  GENERAL APPEARANCE:  This is an obese white female in no acute distress.  HEENT:  Normocephalic, atraumatic.  EACs with some cerumen but TMs were  visualized.  Oropharynx revealed the patient to be edentulous with  denture in place.  Conjunctiva and sclerae was clear.  NECK:  Supple.  There is no thyromegaly.  NODES:  No adenopathy was noted in the submandibular, cervical or  supraclavicular regions.  CHEST:  No deformity was noted.  She had no CVA  tenderness.  LUNGS:  Clear with no rales, wheezes or rhonchi.  CARDIOVASCULAR:  2+ radial pulse, no JVD or carotid bruit.  She had a  quiet precordium with regular rate and rhythm without murmurs.  BREASTS:  Exam deferred to outpatient evaluation.  ABDOMEN:  Obese.  Positive bowel sounds.  No organosplenomegaly was  noted.  No guarding or rebound.  GENITALIA AND RECTAL:  Exams deferred.  EXTREMITIES: No clubbing, cyanosis, edema or deformity.  NEUROLOGIC:  Exam was nonfocal.   DATABASE:  EKG at 0425 hours revealed atrial fibrillation with rapid  ventricular response.  There was ST depression in V3-V6.   Chemistries with sodium 138, potassium 4.1, chloride 107, CO2 21.3, BUN  34, creatinine 1.1, glucose 171.  LFTs were normal.  UA was negative.  Hemoglobin 13.1 grams, white count 11,300 with a normal differential.  CK-MB point of care was 1.1, troponin I point of care was 0.05.   Chest x-ray with no active disease.  No pulmonary infiltrates, no  effusions.   ASSESSMENT/PLAN:  1. Cardiovascular:  Patient with prior cardiac cath revealing      nonobstructive coronary disease in a patient with very high cardiac      risk factors including diabetes, hypertension and hyperlipidemia,      family history, obesity and sedentary lifestyle, who now presents      with chest pain and new atrial fibrillation.  Question of whether      the atrial fibrillation caused her to have ischemic changes on EKG.      The patient is now pain-free and she did convert to sinus rhythm      with IV Lopressor.  Plan is to admit for rule out myocardial      infarction to telemetry bed.  Will get cardiac panel q.8h. x3.      Will check a TSH will get a 2-D. echo.  Will defer to the [facility] as to need for cardiology consult.  2. Diabetes.  The patient evidently has been stable.  She does have an      elevated serum glucose at this time.  Plan to add an A1c to the      patient's current  labs.  Will have her continue her home      medications.  No sliding scale will be used at this  time.  3. Hypertension.  The patient is stable on multiple medications which      will be continued.  We will add Lopressor as noted above.  4. Hyperlipidemia.  Will have fasting lipid panel to the patient's      labs and continue her Pravachol.      Rosalyn Gess Norins, MD  Electronically Signed     MEN/MEDQ  D:  06/28/2007  T:  06/29/2007  Job:  [id number]   cc:   [name], M.D.

## 2011-03-16 NOTE — Cardiovascular Report (Signed)
[place]  Patient:    [name], [name]                      MRN: [id number] Attending:  Meade Maw, M.D.                        Cardiac Catheterization  PROCEDURE PERFORMED:  Left heart catheterization; coronary angiography.  INDICATION FOR PROCEDURE:  Persistent chest pain, noncritical disease on left heart catheterization of July 2000, with continual chest pain.  DESCRIPTION OF PROCEDURE:  After obtaining written informed consent, the patient was brought to the cardiac catheterization lab in the post-absorptive state. Preop sedation was achieved using IV Versed.  Right femoral head was then identified using radiographic technique.  Local anesthesia was achieved using 1% Xylocaine.  A 6-French hemostasis sheath was placed into the right femoral artery using a modified Seldinger technique.  There was some scarring from the previous percutaneous device which was deployed.  The artery was pre-dilated with the dilator; this was then followed by the dilator and sheath.  Selective coronary angiography was performed using JL4 and JR4 Judkins catheters.  Multiple views ere taken.  Nonionic contrast was used and was hand injected.  A single-plane ventriculogram was performed in the RAO position using a 6-French pigtail curved catheter.  Left ventriculogram was deferred due to the previous normal left ventriculogram and to conserve contrast material.  All catheter exchanges were ade over a guidewire.  The hemostasis sheath was flushed after each catheter exchange.  FINDINGS:  The aortic pressure was 138/65.  LV pressure was 138/12.  Left main coronary artery:  The left main coronary artery bifurcated into the left anterior descending and circumflex vessel. The left main coronary artery had no  significant disease.  Left anterior descending:  The left anterior descending gives rise to a large bifurcating D-1, a moderate-size septal  perforator and ended as an apical recurrent branch.  The previously identified 50-60% stenosis in the D-1 could not be identified.  Multiple RAO cranial views were taken in attempt to isolate this lesion.  The first septal perforator did have 20-30% ostial disease.  Left circumflex vessel:  The left circumflex gave rise to a large OM-1 versus a  ramus, a moderate OM-2 and moderate OM-3 and the circumflex then went on to end as a small A-V groove vessel.  There was a 20-30% lesion in the mid-circumflex; otherwise, the circumflex was free of disease.  Right coronary artery:  The right coronary artery was dominant for the posterior circulation, gave rise to some small RV margins, a moderate-size PDA and a posterolateral branch.  The right coronary artery had ostial disease up to 20-30%; otherwise, there was no significant disease noted in the right coronary artery.   IMPRESSION: 1. Noncritical disease involving the first septal perforator, the mid-circumflex    and the ostial right. 2. Normal left ventricular end-diastolic pressure.  RECOMMENDATION:  Medical management and consider other etiologies for her chest  pain. DD:  11/21/99 TD:  11/21/99 Job: [id number]

## 2011-03-21 ENCOUNTER — Encounter (HOSPITAL_BASED_OUTPATIENT_CLINIC_OR_DEPARTMENT_OTHER): Payer: Medicare Other | Admitting: Oncology

## 2011-03-21 DIAGNOSIS — C8589 Other specified types of non-Hodgkin lymphoma, extranodal and solid organ sites: Secondary | ICD-10-CM

## 2011-03-21 DIAGNOSIS — Z452 Encounter for adjustment and management of vascular access device: Secondary | ICD-10-CM

## 2011-04-04 ENCOUNTER — Other Ambulatory Visit: Payer: Self-pay | Admitting: Orthopedic Surgery

## 2011-04-04 DIAGNOSIS — M25511 Pain in right shoulder: Secondary | ICD-10-CM

## 2011-04-07 ENCOUNTER — Other Ambulatory Visit: Payer: Medicare Other

## 2011-04-09 ENCOUNTER — Other Ambulatory Visit: Payer: Self-pay | Admitting: Family Medicine

## 2011-04-09 DIAGNOSIS — Z1231 Encounter for screening mammogram for malignant neoplasm of breast: Secondary | ICD-10-CM

## 2011-04-10 ENCOUNTER — Ambulatory Visit
Admission: RE | Admit: 2011-04-10 | Discharge: 2011-04-10 | Disposition: A | Discharge status: Home / Self Care | Payer: Medicare Other | Source: Ambulatory Visit | Attending: Family Medicine | Admitting: Family Medicine

## 2011-04-10 DIAGNOSIS — Z1231 Encounter for screening mammogram for malignant neoplasm of breast: Secondary | ICD-10-CM

## 2011-04-13 ENCOUNTER — Ambulatory Visit
Admission: RE | Admit: 2011-04-13 | Discharge: 2011-04-13 | Disposition: A | Discharge status: Home / Self Care | Payer: Medicare Other | Source: Ambulatory Visit | Attending: Orthopedic Surgery | Admitting: Orthopedic Surgery

## 2011-04-13 DIAGNOSIS — M25511 Pain in right shoulder: Secondary | ICD-10-CM

## 2011-04-24 ENCOUNTER — Encounter (HOSPITAL_BASED_OUTPATIENT_CLINIC_OR_DEPARTMENT_OTHER)
Admission: RE | Admit: 2011-04-24 | Discharge: 2011-04-24 | Disposition: A | Discharge status: Home / Self Care | Payer: Medicare Other | Source: Ambulatory Visit | Attending: Orthopedic Surgery | Admitting: Orthopedic Surgery

## 2011-04-24 LAB — BASIC METABOLIC PANEL
BUN: 22 mg/dL (ref 6–23)
CO2: 29 mEq/L (ref 19–32)
Chloride: 102 mEq/L (ref 96–112)
Creatinine, Ser: 1.29 mg/dL — ABNORMAL HIGH (ref 0.50–1.10)

## 2011-04-26 ENCOUNTER — Ambulatory Visit (HOSPITAL_BASED_OUTPATIENT_CLINIC_OR_DEPARTMENT_OTHER)
Admission: RE | Admit: 2011-04-26 | Discharge: 2011-04-27 | Disposition: A | Discharge status: Home / Self Care | Payer: Medicare Other | Source: Ambulatory Visit | Attending: Orthopedic Surgery | Admitting: Orthopedic Surgery

## 2011-04-26 DIAGNOSIS — M7500 Adhesive capsulitis of unspecified shoulder: Secondary | ICD-10-CM | POA: Insufficient documentation

## 2011-04-26 DIAGNOSIS — I10 Essential (primary) hypertension: Secondary | ICD-10-CM | POA: Insufficient documentation

## 2011-04-26 DIAGNOSIS — E119 Type 2 diabetes mellitus without complications: Secondary | ICD-10-CM | POA: Insufficient documentation

## 2011-04-26 DIAGNOSIS — Z5333 Arthroscopic surgical procedure converted to open procedure: Secondary | ICD-10-CM | POA: Insufficient documentation

## 2011-04-26 DIAGNOSIS — K08109 Complete loss of teeth, unspecified cause, unspecified class: Secondary | ICD-10-CM | POA: Insufficient documentation

## 2011-04-26 DIAGNOSIS — K219 Gastro-esophageal reflux disease without esophagitis: Secondary | ICD-10-CM | POA: Insufficient documentation

## 2011-04-26 DIAGNOSIS — M19019 Primary osteoarthritis, unspecified shoulder: Secondary | ICD-10-CM | POA: Insufficient documentation

## 2011-04-26 DIAGNOSIS — M75120 Complete rotator cuff tear or rupture of unspecified shoulder, not specified as traumatic: Secondary | ICD-10-CM | POA: Insufficient documentation

## 2011-04-26 DIAGNOSIS — M24119 Other articular cartilage disorders, unspecified shoulder: Secondary | ICD-10-CM | POA: Insufficient documentation

## 2011-04-26 DIAGNOSIS — Z01812 Encounter for preprocedural laboratory examination: Secondary | ICD-10-CM | POA: Insufficient documentation

## 2011-04-26 DIAGNOSIS — M25819 Other specified joint disorders, unspecified shoulder: Secondary | ICD-10-CM | POA: Insufficient documentation

## 2011-04-27 ENCOUNTER — Inpatient Hospital Stay (HOSPITAL_COMMUNITY): Payer: Medicare Other

## 2011-04-27 ENCOUNTER — Emergency Department (HOSPITAL_COMMUNITY): Payer: Medicare Other

## 2011-04-27 ENCOUNTER — Inpatient Hospital Stay (HOSPITAL_COMMUNITY)
Admission: EM | Admit: 2011-04-27 | Discharge: 2011-04-30 | DRG: 281 | Disposition: A | Discharge status: Home / Self Care | Payer: Medicare Other | Attending: Interventional Cardiology | Admitting: Interventional Cardiology

## 2011-04-27 DIAGNOSIS — I4891 Unspecified atrial fibrillation: Secondary | ICD-10-CM | POA: Diagnosis present

## 2011-04-27 DIAGNOSIS — C8589 Other specified types of non-Hodgkin lymphoma, extranodal and solid organ sites: Secondary | ICD-10-CM | POA: Diagnosis present

## 2011-04-27 DIAGNOSIS — E785 Hyperlipidemia, unspecified: Secondary | ICD-10-CM | POA: Diagnosis present

## 2011-04-27 DIAGNOSIS — I509 Heart failure, unspecified: Secondary | ICD-10-CM | POA: Diagnosis present

## 2011-04-27 DIAGNOSIS — I5030 Unspecified diastolic (congestive) heart failure: Secondary | ICD-10-CM | POA: Diagnosis present

## 2011-04-27 DIAGNOSIS — I119 Hypertensive heart disease without heart failure: Secondary | ICD-10-CM | POA: Diagnosis present

## 2011-04-27 DIAGNOSIS — E119 Type 2 diabetes mellitus without complications: Secondary | ICD-10-CM | POA: Diagnosis present

## 2011-04-27 DIAGNOSIS — I214 Non-ST elevation (NSTEMI) myocardial infarction: Principal | ICD-10-CM | POA: Diagnosis present

## 2011-04-27 LAB — CARDIAC PANEL(CRET KIN+CKTOT+MB+TROPI)
Relative Index: 4.3 — ABNORMAL HIGH (ref 0.0–2.5)
Total CK: 393 U/L — ABNORMAL HIGH (ref 7–177)
Troponin I: 6.98 ng/mL (ref <0.30)

## 2011-04-27 LAB — COMPREHENSIVE METABOLIC PANEL
BUN: 18 mg/dL (ref 6–23)
Calcium: 9.2 mg/dL (ref 8.4–10.5)
GFR calc Af Amer: 54 mL/min — ABNORMAL LOW (ref >60)
Glucose, Bld: 200 mg/dL — ABNORMAL HIGH (ref 70–99)
Total Protein: 6.4 g/dL (ref 6.0–8.3)

## 2011-04-27 LAB — CK TOTAL AND CKMB (NOT AT ARMC)
CK, MB: 7.5 ng/mL (ref 0.3–4.0)
Total CK: 444 U/L — ABNORMAL HIGH (ref 7–177)

## 2011-04-27 LAB — PROTIME-INR
INR: 1.03 (ref 0.00–1.49)
Prothrombin Time: 13.7 seconds (ref 11.6–15.2)

## 2011-04-27 LAB — CBC
HCT: 32.6 % — ABNORMAL LOW (ref 36.0–46.0)
Hemoglobin: 11.3 g/dL — ABNORMAL LOW (ref 12.0–15.0)
RDW: 13.7 % (ref 11.5–15.5)
WBC: 9.9 10*3/uL (ref 4.0–10.5)

## 2011-04-27 LAB — MRSA PCR SCREENING: MRSA by PCR: NEGATIVE

## 2011-04-27 LAB — POCT ACTIVATED CLOTTING TIME: Activated Clotting Time: 155 seconds

## 2011-04-27 LAB — APTT: aPTT: 41 seconds — ABNORMAL HIGH (ref 24–37)

## 2011-04-27 LAB — GLUCOSE, CAPILLARY

## 2011-04-27 LAB — D-DIMER, QUANTITATIVE: D-Dimer, Quant: 0.57 ug/mL-FEU — ABNORMAL HIGH (ref 0.00–0.48)

## 2011-04-28 LAB — CARDIAC PANEL(CRET KIN+CKTOT+MB+TROPI)
CK, MB: 8.1 ng/mL (ref 0.3–4.0)
Relative Index: 2.8 — ABNORMAL HIGH (ref 0.0–2.5)
Troponin I: 2.93 ng/mL (ref <0.30)

## 2011-04-28 LAB — GLUCOSE, CAPILLARY
Glucose-Capillary: 157 mg/dL — ABNORMAL HIGH (ref 70–99)
Glucose-Capillary: 189 mg/dL — ABNORMAL HIGH (ref 70–99)
Glucose-Capillary: 199 mg/dL — ABNORMAL HIGH (ref 70–99)

## 2011-04-28 LAB — BASIC METABOLIC PANEL
Chloride: 99 mEq/L (ref 96–112)
GFR calc Af Amer: 46 mL/min — ABNORMAL LOW (ref >60)
GFR calc non Af Amer: 38 mL/min — ABNORMAL LOW (ref >60)
Potassium: 4.4 mEq/L (ref 3.5–5.1)
Sodium: 136 mEq/L (ref 135–145)

## 2011-04-28 LAB — CBC
MCV: 92.3 fL (ref 78.0–100.0)
Platelets: 196 10*3/uL (ref 150–400)
RBC: 3.23 MIL/uL — ABNORMAL LOW (ref 3.87–5.11)
WBC: 8.1 10*3/uL (ref 4.0–10.5)

## 2011-04-29 DIAGNOSIS — I214 Non-ST elevation (NSTEMI) myocardial infarction: Secondary | ICD-10-CM

## 2011-04-29 LAB — CBC
MCH: 31.6 pg (ref 26.0–34.0)
MCHC: 33.7 g/dL (ref 30.0–36.0)
MCV: 93.9 fL (ref 78.0–100.0)
Platelets: 194 10*3/uL (ref 150–400)

## 2011-04-29 LAB — GLUCOSE, CAPILLARY
Glucose-Capillary: 164 mg/dL — ABNORMAL HIGH (ref 70–99)
Glucose-Capillary: 214 mg/dL — ABNORMAL HIGH (ref 70–99)

## 2011-04-29 LAB — BASIC METABOLIC PANEL
CO2: 29 mEq/L (ref 19–32)
Calcium: 8.6 mg/dL (ref 8.4–10.5)
Creatinine, Ser: 1.39 mg/dL — ABNORMAL HIGH (ref 0.50–1.10)
GFR calc non Af Amer: 38 mL/min — ABNORMAL LOW (ref >60)
Sodium: 137 mEq/L (ref 135–145)

## 2011-04-30 LAB — CBC
HCT: 29.2 % — ABNORMAL LOW (ref 36.0–46.0)
Hemoglobin: 10.2 g/dL — ABNORMAL LOW (ref 12.0–15.0)
MCH: 32.8 pg (ref 26.0–34.0)
MCHC: 34.9 g/dL (ref 30.0–36.0)
RBC: 3.11 MIL/uL — ABNORMAL LOW (ref 3.87–5.11)

## 2011-04-30 LAB — GLUCOSE, CAPILLARY
Glucose-Capillary: 176 mg/dL — ABNORMAL HIGH (ref 70–99)
Glucose-Capillary: 92 mg/dL (ref 70–99)
Glucose-Capillary: 233 mg/dL — ABNORMAL HIGH (ref 70–99)

## 2011-05-01 ENCOUNTER — Observation Stay (HOSPITAL_COMMUNITY)
Admission: EM | Admit: 2011-05-01 | Discharge: 2011-05-03 | Disposition: A | Discharge status: Home / Self Care | Payer: Medicare Other | Attending: Cardiology | Admitting: Cardiology

## 2011-05-01 ENCOUNTER — Emergency Department (HOSPITAL_COMMUNITY): Payer: Medicare Other

## 2011-05-01 DIAGNOSIS — T465X1A Poisoning by other antihypertensive drugs, accidental (unintentional), initial encounter: Principal | ICD-10-CM | POA: Insufficient documentation

## 2011-05-01 DIAGNOSIS — I119 Hypertensive heart disease without heart failure: Secondary | ICD-10-CM | POA: Insufficient documentation

## 2011-05-01 DIAGNOSIS — Y92009 Unspecified place in unspecified non-institutional (private) residence as the place of occurrence of the external cause: Secondary | ICD-10-CM | POA: Insufficient documentation

## 2011-05-01 DIAGNOSIS — Z9119 Patient's noncompliance with other medical treatment and regimen: Secondary | ICD-10-CM | POA: Insufficient documentation

## 2011-05-01 DIAGNOSIS — I5030 Unspecified diastolic (congestive) heart failure: Secondary | ICD-10-CM | POA: Insufficient documentation

## 2011-05-01 DIAGNOSIS — Z7982 Long term (current) use of aspirin: Secondary | ICD-10-CM | POA: Insufficient documentation

## 2011-05-01 DIAGNOSIS — E785 Hyperlipidemia, unspecified: Secondary | ICD-10-CM | POA: Insufficient documentation

## 2011-05-01 DIAGNOSIS — I252 Old myocardial infarction: Secondary | ICD-10-CM | POA: Insufficient documentation

## 2011-05-01 DIAGNOSIS — Z79899 Other long term (current) drug therapy: Secondary | ICD-10-CM | POA: Insufficient documentation

## 2011-05-01 DIAGNOSIS — I4891 Unspecified atrial fibrillation: Secondary | ICD-10-CM | POA: Insufficient documentation

## 2011-05-01 DIAGNOSIS — Z91199 Patient's noncompliance with other medical treatment and regimen due to unspecified reason: Secondary | ICD-10-CM | POA: Insufficient documentation

## 2011-05-01 DIAGNOSIS — E119 Type 2 diabetes mellitus without complications: Secondary | ICD-10-CM | POA: Insufficient documentation

## 2011-05-01 DIAGNOSIS — T46901A Poisoning by unspecified agents primarily affecting the cardiovascular system, accidental (unintentional), initial encounter: Secondary | ICD-10-CM | POA: Insufficient documentation

## 2011-05-01 DIAGNOSIS — C8589 Other specified types of non-Hodgkin lymphoma, extranodal and solid organ sites: Secondary | ICD-10-CM | POA: Insufficient documentation

## 2011-05-01 DIAGNOSIS — I9589 Other hypotension: Secondary | ICD-10-CM | POA: Insufficient documentation

## 2011-05-01 LAB — CBC
Platelets: 185 10*3/uL (ref 150–400)
RBC: 3.2 MIL/uL — ABNORMAL LOW (ref 3.87–5.11)
RDW: 13.9 % (ref 11.5–15.5)
WBC: 5.3 10*3/uL (ref 4.0–10.5)

## 2011-05-01 LAB — COMPREHENSIVE METABOLIC PANEL
ALT: 12 U/L (ref 0–35)
AST: 18 U/L (ref 0–37)
Albumin: 3.1 g/dL — ABNORMAL LOW (ref 3.5–5.2)
Alkaline Phosphatase: 48 U/L (ref 39–117)
CO2: 29 mEq/L (ref 19–32)
Chloride: 100 mEq/L (ref 96–112)
Creatinine, Ser: 1.31 mg/dL — ABNORMAL HIGH (ref 0.50–1.10)
Potassium: 4.3 mEq/L (ref 3.5–5.1)
Sodium: 139 mEq/L (ref 135–145)
Total Bilirubin: 0.4 mg/dL (ref 0.3–1.2)

## 2011-05-01 LAB — DIFFERENTIAL
Basophils Absolute: 0 10*3/uL (ref 0.0–0.1)
Basophils Relative: 0 % (ref 0–1)
Eosinophils Absolute: 0.1 10*3/uL (ref 0.0–0.7)
Eosinophils Relative: 1 % (ref 0–5)
Neutrophils Relative %: 62 % (ref 43–77)

## 2011-05-01 LAB — URINALYSIS, ROUTINE W REFLEX MICROSCOPIC
Glucose, UA: 100 mg/dL — AB
Hgb urine dipstick: NEGATIVE
Ketones, ur: 15 mg/dL — AB
Leukocytes, UA: NEGATIVE
pH: 5.5 (ref 5.0–8.0)

## 2011-05-01 LAB — GLUCOSE, CAPILLARY

## 2011-05-01 LAB — HEMOGLOBIN A1C: Hgb A1c MFr Bld: 6.6 % — ABNORMAL HIGH (ref <5.7)

## 2011-05-01 NOTE — Discharge Summary (Signed)
[name], [name] NO.:  [id number]  MEDICAL RECORD NO.:  [id number]  LOCATION:  2030                         FACILITY:  [facility]  PHYSICIAN:  Georga Hacking, M.D.DATE OF BIRTH:  1941-09-18  DATE OF ADMISSION:  04/27/2011 DATE OF DISCHARGE:  04/30/2011                              DISCHARGE SUMMARY   FINAL DIAGNOSES: 1. Non-ST-elevation myocardial infarction - initial episode.     a.     Catheterization showing nonobstructive coronary artery      disease.     b.     Minimal elevation of troponin and MB. 2. History of paroxysmal atrial fibrillation. 3. Type 2 diabetes mellitus, non-insulin dependent. 4. Hypertensive heart disease. 5. Diastolic dysfunction. 6. Hyperlipidemia. 7. History of lymphoma, currently under treatment and in remission.  PROCEDURES:  Cardiac catheterization.  HISTORY:  The patient is a 70 year old diabetic woman who had previously seen for atrial fibrillation.  She had previously been on Pradaxa and was scheduled to have shoulder surgery.  She had a history of nonobstructive coronary artery disease previously.  She had stopped her Pradaxa 7 days prior to the surgery on the advice of her orthopedic surgeon.  She had a right shoulder repair of rotator cuff injury and presented to the emergency room with chest discomfort and nausea that started, and she had some ST depression across her anterior leads.  She was admitted to the hospital emergently because of positive troponins and saw Dr. Eldridge Dace who admitted her and took her to the catheterization laboratory.  Please see previously dictated history and physical for remainder of the details.  HOSPITAL COURSE:  She was taken emergently to the Cath Lab.  There was a 40-50% proximal circumflex stenosis and circumflex was widely patent. LAD had mild diffuse disease of 25% and a second diagonal had a 40% ostial stenosis.  The right coronary had mild diffuse disease but was widely  patent.  She was somewhat hypertensive on arrival.  There was thought to be mild depression of her LV function and an echocardiogram was done that confirmed this and was thought that she might have a Takotsubo type syndrome and mild stress-induced problems.  She was treated with heparin and this was later discontinued.  She was observed over the weekend.  Her hemoglobin was 11.3 and dropped to 9.9 and the heparin was stopped.  B natriuretic peptide level was 22,000 and thought she might have some diastolic dysfunction and was given an dose of intravenous Lasix.  Her creatinine rose from 1.2 to 1.39, remained stable, and her diabetic control was fair at this time.  She had a CT angiogram of the chest ordered but was later cancelled by the covering MD and not felt necessary to be done. She was seen by Cardiac Rehab, had no recurrence of pain and is discharged at this time in improved condition to continue her treatment as an outpatient.  He was thought that she would be at increased risk for bleeding from the addition of Plavix to her regimen, so we are just going to restart her Pradaxa at this time.  She was ambulatory in the hall, seen by rehab, and we will plan to have her at  phase 2 rehab.  She is discharged at this time in improved condition on furosemide 40 mg daily and increased dose of metoprolol 50 mg b.i.d., Amaryl 4 mg daily, aspirin 81 mg daily, Crestor 5 mg daily, hydrochlorothiazide 25 mg daily, lisinopril 20 mg daily, multivitamins daily, nabumetone 500 mg twice daily, Percocet 1- 2 tablets q.6 h. as needed, Pradaxa 75 mg twice daily, spironolactone 25 mg daily, tramadol 50 mg q.6 h. as needed for pain.  She is to follow up with me in 1 week and is to call if there are recurrent problems.     Georga Hacking, M.D.     WST/MEDQ  D:  04/30/2011  T:  05/01/2011  Job:  [id number]  cc:   Josph Macho, M.D. [name], M.D.  Electronically Signed by Lacretia Nicks. Donnie Aho M.D.  on 05/01/2011 05:16:01 PM

## 2011-05-02 LAB — BASIC METABOLIC PANEL
BUN: 20 mg/dL (ref 6–23)
Chloride: 101 mEq/L (ref 96–112)
Creatinine, Ser: 1.26 mg/dL — ABNORMAL HIGH (ref 0.50–1.10)
GFR calc Af Amer: 51 mL/min — ABNORMAL LOW (ref >60)
Glucose, Bld: 87 mg/dL (ref 70–99)
Potassium: 3.8 mEq/L (ref 3.5–5.1)

## 2011-05-02 LAB — GLUCOSE, CAPILLARY
Glucose-Capillary: 84 mg/dL (ref 70–99)
Glucose-Capillary: 139 mg/dL — ABNORMAL HIGH (ref 70–99)

## 2011-05-03 LAB — CBC
HCT: 30.9 % — ABNORMAL LOW (ref 36.0–46.0)
Hemoglobin: 10.5 g/dL — ABNORMAL LOW (ref 12.0–15.0)
RBC: 3.27 MIL/uL — ABNORMAL LOW (ref 3.87–5.11)
WBC: 5.9 10*3/uL (ref 4.0–10.5)

## 2011-05-03 LAB — BASIC METABOLIC PANEL
BUN: 24 mg/dL — ABNORMAL HIGH (ref 6–23)
CO2: 32 mEq/L (ref 19–32)
Chloride: 99 mEq/L (ref 96–112)
GFR calc non Af Amer: 46 mL/min — ABNORMAL LOW (ref >60)
Glucose, Bld: 112 mg/dL — ABNORMAL HIGH (ref 70–99)
Potassium: 3.8 mEq/L (ref 3.5–5.1)
Sodium: 140 mEq/L (ref 135–145)

## 2011-05-04 ENCOUNTER — Other Ambulatory Visit (HOSPITAL_COMMUNITY): Payer: Medicare Other

## 2011-05-11 NOTE — Op Note (Signed)
  NAMEMarland Kitchen  [name], [name] NO.:  [id number]  MEDICAL RECORD NO.:  [id number]  LOCATION:                                 FACILITY:  PHYSICIAN:  Loreta Ave, M.D. DATE OF BIRTH:  1940-12-09  DATE OF PROCEDURE:  04/26/2011 DATE OF DISCHARGE:                              OPERATIVE REPORT   PREOPERATIVE DIAGNOSIS:  Right shoulder impingement, distal clavicle osteolysis, partial rotator cuff tear.  POSTOPERATIVE DIAGNOSIS:  Right shoulder full-thickness rotator cuff tear, impingement, moderate adhesive capsulitis, labral tears, DJD, AC joint.  PROCEDURE:  Right shoulder exam under anesthesia with manipulation. Arthroscopy with lysis debridement of adhesions, debridement of labrum. Bursectomy.  Acromioplasty.  CA ligament release.  Excision of distal clavicle.  Arthroscopic-assisted rotator cuff repair.  FiberWire suture x2, SwiveLock anchors x2.  SURGEON:  Loreta Ave, MD.  ASSISTANT:  Genene Churn. Barry Dienes, [place], present throughout the entire case and necessary for timely completion of procedure.  ANESTHESIA:  General.  BLOOD LOSS:  Minimal.  SPECIMEN:  None.  CULTURES:  None.  COMPLICATIONS:  None.  DRESSING:  Soft compressive shoulder immobilizer.  DESCRIPTION OF PROCEDURE:  The patient was brought to the operating room, placed in the operating table in supine position.  After adequate anesthesia had been obtained, shoulder examined.  Moderate restricted motion, 30%, especially rotation.  Manipulated breaking up adhesions achieving full motion stable shoulder.  Placed in beach-chair position on the shoulder positioner, prepped and draped in usual sterile fashion. Three portals, anterior, posterior, and lateral.  Arthroscope introduced and the shoulder distended and inspected.  Tearing of the capsule anteriorly and inferiorly from manipulation obvious.  Debrided.  Complex labral tear was debrided.  No instability.  Articular cartilage, biceps tendon,  biceps anchor, and the undersurface of the cuff looked good. Intra-articular adhesion and synovitis debrided.  Cannula redirected subacromially.  Adhesions and bursitis resected.  Supraspinatus tendon in the crescent region had a functional full-thickness tear with only a paper thin layer of the bottom.  Debrided, mobilized for repair.  The cable still anchored in the front and back.  Tuberosity roughened.  Type 3 acromion.  Bursa resected.  Acromioplasty to a type 1 acromion with shaver and high-speed bur, releasing CA ligament cautery.  Distal clavicle exposed.  Grade 4 changes, periarticular spurs.  Periarticular spurs lateral centimeter of clavicle resected.  Adequacy of decompression confirmed, viewing from all portals.  A cannula anterior and lateral.  With the scorpion device, the cuff was captured with two horizontal mattress sutures and then firmly implanted down the tuberosity with two SwiveLock anchors.  Nice firm watertight closure of the cuff achieved.  Instruments and fluid removed.  Portals closed with nylon.  Sterile compressive dressing applied.  Shoulder mobilizer applied.  Anesthesia reversed.  Brought to the recovery room.  Tolerated surgery well.  No complications.     Loreta Ave, M.D.     DFM/MEDQ  D:  04/26/2011  T:  04/27/2011  Job:  [id number]  Electronically Signed by Mckinley Jewel M.D. on 05/11/2011 08:26:08 AM

## 2011-05-17 NOTE — Discharge Summary (Signed)
[name], [name] NO.:  [id number]  MEDICAL RECORD NO.:  [id number]  LOCATION:  3705                         FACILITY:  [facility]  PHYSICIAN:  Georga Hacking, M.D.DATE OF BIRTH:  29-May-1941  DATE OF ADMISSION:  05/01/2011 DATE OF DISCHARGE:  05/03/2011                              DISCHARGE SUMMARY   FINAL DIAGNOSES: 1. Hypotension due to over medication due to medical noncompliance. 2. Recent non-ST elevation myocardial infarction.     a.     Catheterization showing nonobstructive coronary artery      disease. 3. Hypertensive heart disease. 4. Remote history of atrial fibrillation. 5. History of lymphoma, currently in remission. 6. Long-term anticoagulation with Pradaxa. 7. Recent shoulder surgery. 8. Hyperlipidemia. 9. Non-insulin dependent diabetes.  HISTORY:  This 70 year old female was discharged from the hospital the day prior to admission.  She had shoulder repair done 4 days prior to admission and the day afterwards, had onset of substernal chest discomfort, and was hospitalized with positive troponins. Catheterization showed some coronary artery disease that was not severe and she was treated medically.  She had a mildly elevated BNP, treated with increased furosemide.  She was discharged yesterday and did not adhere to the discharge medication and instructions that were listed and had been gone over with her, but went back taking additional medications that we had asked her to stop.  The morning of admission, she awoke feeling well and she took her medicines in addition to the ones that she was not supposed to take and 2 hours later, had the onset of severe weakness that occurred while she was in the bathroom.  She did not have chest pain or shortness of breath, but had slight sweating, was brought back to the emergency room, she was hypotensive.  She was given intravenous fluids and she was admitted to the hospital for further  treatment. Please see the previously dictated history and physical for remainder of the details.  HOSPITAL COURSE:  Chest x-ray shows diffuse osteopenia with clear lung fields.  Laboratory data showed a hemoglobin of 10.5, hematocrit of 30. Chemistry panel shows a sodium of 139, potassium 4.3, chloride 100, CO2 29, glucose 257, BUN 21, creatinine 1.31.  GFR is 40.  Hemoglobin A1c is 6.6.  B-type natriuretic peptide had fallen to 10,645 from 22,000. Troponin was 0.38 but was lower than it previously had been.  Urinalysis showed mild glycosuria.  The patient was admitted to the hospital and medicines that she had started taking again were held.  In addition, we stopped her hydrochlorothiazide and monitored her.  Her blood pressure gradually came up and she was observed in the hospital another day because of concerns about taking her medicines properly when she went home.  Orthostatic blood pressures showed a blood pressure of 149/85 lying, and 139/74 sitting, was 136/81 lying on the day prior to this, and 122/76 sitting.  She remained stable, was out of the bed, and we felt that like we could discharge her home to continue her treatment as an outpatient.  An extensive instruction period of time was spent, the importance of taking her medicines well.  These again were confirmed in writing and gone over  with her.  She was discharged at this time in improved condition: 1. Furosemide 40 mg daily. 2. Pradaxa 75 mg q.12 hours. 3. Amaryl 4 mg daily. 4. Lisinopril 20 mg daily. 5. Metoprolol 50 mg b.i.d. 6. Nexium 40 mg daily. 7. Crestor 5 mg daily. 8. Aldactone 25 mg daily. 9. Tramadol 50 mg q.6 hours as needed. 10.Enteric-coated aspirin 81 mg daily. 11.Lisinopril 20 mg daily. 12.Multivitamins daily. 13.Nabumetone 500 mg b.i.d.  She is to follow up with me on May 07, 2011, she is continue to have shoulder followup.  She is to take her blood pressure at home.  She is to discontinue  clonidine at this time.     Georga Hacking, M.D.     WST/MEDQ  D:  05/03/2011  T:  05/03/2011  Job:  [id number]  cc:   [name], M.D. Josph Macho, M.D.  Electronically Signed by Lacretia Nicks. Donnie Aho M.D. on 05/17/2011 09:46:58 AM

## 2011-05-17 NOTE — H&P (Signed)
[name], HILLMER NO.:  [id number]  MEDICAL RECORD NO.:  [id number]  LOCATION:  [facility]                         FACILITY:  [facility]  PHYSICIAN:  Georga Hacking, M.D.DATE OF BIRTH:  12-10-1940  DATE OF ADMISSION:  05/01/2011                             HISTORY & PHYSICAL   REASON FOR ADMISSION:  Weakness.  HISTORY:  The patient is a 70 year old female who was just discharged from the hospital yesterday.  She had a shoulder repair done last Thursday and on Friday morning, she had the onset of substernal chest discomfort and was hospitalized with positive troponins.  Cardiac catheterization showed nonobstructive coronary artery disease and there was mild elevation of troponin and MB, shows a history of paroxysmal atrial fibrillation, otherwise.  She was watched over the weekend and had an elevated BNP level and yesterday her blood pressure was stable and she had been doing well and was discharged home yesterday.  She felt well on discharge yesterday, but did not take her medicines as prescribed on the discharge instructions adding back an Nexium as well as clonidine to her discharge medications.  This morning, she awoke and felt well, took her medicines in addition to the ones that she was not supposed to take and about 2 hours later had the onset of severe weakness that occurred while she was in the bathroom.  She did not have any chest pain or shortness of breath, but did have some slight sweating and she was brought back to the emergency room where she was found to be hypotensive with a blood pressure of around 90 systolic.  She was given some intravenous fluids.  Laboratory data showed hemoglobin of 10.5. Sodium was 139, potassium is 4.3, BUN is 21, and creatinine 1.31.  Liver enzymes were normal.  Troponin is 0.38, which was less than what it was when she was in the hospital for the past couple of days.  Her blood pressure has come up to 126.  She has had  no chest pain or shortness of breath since discharge and has complained of some mild elevation of her sugar.  She had an argument with her husband last night over whether to take additional medications or not.  He insisted on taking the additional medicines despite the fact that they were not written on her discharge instructions.  She was given nabumetone while in the hospital, but stated that she was not taken that previously at home.  I felt it best in view of her noncompliance to admit her to the hospital to be sure that her blood pressure was going to be fine prior to continued discharge home in light of her noncompliance.  PAST MEDICAL HISTORY:  Recorded in the note of April 28, 2011.  PAST SURGICAL HISTORY:  Recorded in the note of April 28, 2011.  SOCIAL HISTORY:  She is a nonsmoker.  Married.  REVIEW OF SYSTEMS:  Otherwise unremarkable except for noted above.  PHYSICAL EXAMINATION:  GENERAL:  She is a pleasant female, appears stated age. VITAL SIGNS:  Blood pressure is currently 126/63 lying, pulse iscurrently 72 and regular. SKIN:  Warm and dry. ENT:  EOMI.  PERRLA.  C and S clear.  Fundi not examined.  Pharynx negative. NECK:  Supple without masses.  There is no JVD. LUNGS:  Clear. CARDIOVASCULAR:  Showed normal S1 and S2.  There is no S3, soft 1/6 murmur.  Her right arm is in the sling. ABDOMEN:  Soft and nontender.  Femoral distal pulses are 2+.  There is no edema noted.  A 12-lead EKG shows minor nonspecific changes.  She is in normal sinus rhythm.  LABORATORY DATA:  Shows a troponin of 0.39, glucose was 257, BUN is 21, creatinine is 1.31 which is similar to that of April 29, 2011.  Hemoglobin is 10.5 and hematocrit is 30.  IMPRESSION: 1. Severe weakness, likely due to hypotension following excessive     medications due to medical noncompliance and failure to follow     discharge instructions. 2. Nonobstructive coronary artery disease with mild elevations of      troponin recently of uncertain cause. 3. Previous hypertensive heart disease. 4. Hyperlipidemia, under treatment. 5. Non-insulin dependent diabetes. 6. Recent shoulder surgery.  RECOMMENDATIONS:  I will bring in the patient for observation of her blood pressure.  We will check supine and upright blood pressures.  I am going to stop her hydrochlorothiazide.  Continue furosemide.  Check BNP level.  We will watch for a day or two to be sure that her blood pressure is stable prior to letting her to go home.     Georga Hacking, M.D.     WST/MEDQ  D:  05/01/2011  T:  05/01/2011  Job:  [id number]  cc:   [name], M.D.  Electronically Signed by Lacretia Nicks. Donnie Aho M.D. on 05/17/2011 09:47:21 AM

## 2011-05-30 NOTE — Cardiovascular Report (Signed)
[name], [name] NO.:  [id number]  MEDICAL RECORD NO.:  [id number]  LOCATION:  2807                         FACILITY:  [facility]  PHYSICIAN:  Corky Crafts, MDDATE OF BIRTH:  03/15/1941  DATE OF PROCEDURE:  04/27/2011 DATE OF DISCHARGE:                           CARDIAC CATHETERIZATION   PRIMARY CARDIOLOGIST:  Georga Hacking, MD  PROCEDURE PERFORMED:  Left heart catheterization, left ventriculogram, coronary angiogram, and abdominal aortogram.  OPERATOR:  Corky Crafts, MD  INDICATIONS:  Non-ST-elevation MI.  PROCEDURE NARRATIVE:  The risks and benefits of cardiac catheterization were explained to the patient and informed consent was obtained.  She was brought to the cath lab.  She was prepped and draped in usual sterile fashion.  Her right groin was infiltrated with 1% lidocaine, 5- French sheath was placed into right femoral artery using modified Seldinger technique.  Left coronary artery angiography was performed using a JL-4.0 catheter.  The catheter was advanced to the vessel ostium under fluoroscopic guidance.  Digital angiography was performed in multiple projections using hand injection contrast.  Right coronary artery angiography was performed using a JR-4.0 catheter in a similar fashion.  A pigtail catheter was advanced to the ascending aorta and across the aortic valve under fluoroscopic guidance.  Power injection of contrast was performed in the LAO projection because the patient was then able to move her right hand out of the way after recent shoulder surgery.  The catheter was pulled back under continuous hemodynamic pressure monitoring.  Catheter was withdrawn to the abdominal aorta. Power injection of contrast was performed in the AP projection image of the infrarenal aorta.  The sheath was removed and an Angio-Seal was deployed for hemostasis.  FINDINGS:  The left main was widely patent. Left circumflex is a large  vessel.  In the proximal vessel, there is a 40-50% lesion.  The OM-1 is a medium-sized vessel, which was patent and had mild irregularities.  The circumflex after the OM-1, this vessel appeared widely patent. Left anterior descending was a large vessel, which reached the apex. There is mild diffuse disease up to 25% in the mid LAD.  In the first diagonal, this is a large vessel with mild irregularities.  Second diagonal was also a large vessel with an ostial 40% stenosis. The right coronary artery is a large dominant vessel.  There was mild diffuse disease.  There is a large posterolateral artery and large PDA, both of which were widely patent. Left ventriculogram was performed in the LAO projection since we could not move her hand.  The apex did appear to contract.  There is question of anterior hypokinesis with an estimated ejection fraction of 40%.  HEMODYNAMICS:  Left ventricular pressure 188/24 with an LVEDP of 27 mmHg.  Aortic pressure 188/92 with a mean aortic pressure of 35 mmHg. The abdominal aortogram showed no abdominal aortic aneurysm.  There are bilateral single renal arteries, both of which are widely patent.  IMPRESSION: 1. Mild diffuse coronary artery disease without significant focal     stenosis. 2. Increased left ventricular end diastolic pressure in the setting of     the what appears be a decreased left ventricular ejection fraction. 3.  No abdominal aortic aneurysm.  RECOMMENDATIONS:  We will treat for fluid overload.  We will also check for PE given her persistent chest pain.  We will likely get an echocardiogram to see if she would potentially could have a takotsubo cardiomyopathy that may explain her chest pain, increased troponin, and apparent wall motion abnormality.  If the wall motion by echo appears normal, we will have to consider that this all is coming from diastolic dysfunction.     Corky Crafts, MD     JSV/MEDQ  D:  04/27/2011  T:   04/27/2011  Job:  [id number]  Electronically Signed by Lance Muss MD on 05/30/2011 01:05:28 PM

## 2011-05-30 NOTE — H&P (Signed)
NAMEJANUS, VLCEK NO.:  [id number]  MEDICAL RECORD NO.:  [id number]  LOCATION:  2030                         FACILITY:  [facility]  PHYSICIAN:  Corky Crafts, MDDATE OF BIRTH:  1941/02/11  DATE OF ADMISSION:  04/27/2011 DATE OF DISCHARGE:                             HISTORY & PHYSICAL   PRIMARY CARDIOLOGIST:  Georga Hacking, MD  REASON FOR ADMISSION:  Chest pain.  HISTORY OF PRESENT ILLNESS:  The patient is a 70 year old diabetic who had a right shoulder surgery yesterday.  Early this morning, she woke up with chest pain and nausea.  She came to the emergency room and was found to have a positive troponin.  She has a history of atrial fibrillation and has been off Pradaxa for 7 days.  We were called urgently because of ST depression and concerns of ongoing ischemia. There is a concern whether she may need to go to the cath lab emergently.  PAST MEDICAL HISTORY: 1. Atrial fibrillation. 2. Diabetes. 3. Hypertension. 4. Hyperlipidemia.  PAST SURGICAL HISTORY: 1. Appendectomy. 2. Oophorectomy. 3. Hysterectomy. 4. Port-A-Cath insertion. 5. Lymph node biopsy. 6. Recent shoulder surgery.  ALLERGIES:  SULFA and MORPHINE.  HOME MEDICATIONS:  Multivitamin, Amaryl 4 mg daily, aspirin 81 mg daily, Pradaxa 75 mg p.o. b.i.d., hydrochlorothiazide 25 mg daily, lisinopril 20 mg daily, Crestor 5 mg daily, nabumetone, tramadol, Percocet, spironolactone 25 mg daily, metoprolol 25 mg p.o. b.i.d.  SOCIAL HISTORY:  She is a nonsmoker.  She is married.  She has a daughter who is active in her care.  FAMILY HISTORY:  She has a brother who had coronary artery disease.  REVIEW OF SYSTEMS:  Significant for shoulder pain and chest pain along with nausea as noted above.  No recent bleeding problems.  as above, all other systems negative.  PHYSICAL EXAMINATION:  VITAL SIGNS:  Blood pressure 188/108, pulse of 84. GENERAL:  She is awake and alert. HEENT:  Head:   Normocephalic, atraumatic.  Eyes:  Extraocular movements intact. NECK:  No JVD. CARDIOVASCULAR:  Regular rate and rhythm.  S1 and S2. LUNGS:  No wheezing. ABDOMEN:  Obese, soft, nontender. EXTREMITIES:  No swelling.  LABORATORY WORK:  Troponin of 1.5.  CK 444.  ECG shows normal sinus rhythm with some ST depressions diffusely. Chest x-ray shows increased pulmonary vascular congestion and pulmonary edema.  ASSESSMENT AND PLAN:  This is a 70 year old status post shoulder surgery.  PLAN: 1. Cardiac:  The patient has chest pain and positive troponin.  She     has been given nitroglycerin with no relief along with other pain     medication.  Plan for cardiac cath emergently. 2. Continue diabetes control. 3. Hypertension.  Continue home medications. 4. Further plans based on cardiac cath results.  I will speak to Dr.     Eulah Pont, her orthopedic surgeon.  Of note, she was told that she can     start her Pradaxa today.  It does not appear that anticoagulation     will be much of an issue after the surgery.   Corky Crafts, MD     JSV/MEDQ  D:  04/29/2011  T:  04/30/2011  Job:  [id number]  Electronically Signed by Lance Muss MD on 05/30/2011 01:05:36 PM

## 2011-08-10 LAB — CBC
HCT: 34.4 — ABNORMAL LOW
HCT: 37.7
Hemoglobin: 11.9 — ABNORMAL LOW
Hemoglobin: 13.1
MCHC: 34.7
MCHC: 34.8
MCV: 88.7
Platelets: 304
RBC: 3.87
RBC: 4.26
RDW: 13.7
RDW: 14
WBC: 11.3 — ABNORMAL HIGH
WBC: 8.9

## 2011-08-10 LAB — DIFFERENTIAL
Basophils Absolute: 0
Basophils Absolute: 0
Basophils Relative: 0
Basophils Relative: 0
Eosinophils Absolute: 0.3
Eosinophils Relative: 3
Lymphocytes Relative: 49 — ABNORMAL HIGH
Lymphs Abs: 4.3 — ABNORMAL HIGH
Lymphs Abs: 6.9 — ABNORMAL HIGH
Monocytes Absolute: 0.7
Monocytes Relative: 6
Monocytes Relative: 8
Neutro Abs: 3.4
Neutro Abs: 3.5
Neutrophils Relative %: 40 — ABNORMAL LOW

## 2011-08-10 LAB — URINE MICROSCOPIC-ADD ON

## 2011-08-10 LAB — URINALYSIS, ROUTINE W REFLEX MICROSCOPIC
Glucose, UA: NEGATIVE
Ketones, ur: NEGATIVE
Nitrite: NEGATIVE
Specific Gravity, Urine: 1.009
pH: 6

## 2011-08-10 LAB — CARDIAC PANEL(CRET KIN+CKTOT+MB+TROPI)
CK, MB: 2.8
CK, MB: 2.9
Relative Index: INVALID
Relative Index: INVALID
Total CK: 79
Total CK: 82
Troponin I: 0.1 — ABNORMAL HIGH
Troponin I: 0.14 — ABNORMAL HIGH

## 2011-08-10 LAB — HEPATIC FUNCTION PANEL
AST: 17
Alkaline Phosphatase: 50
Bilirubin, Direct: <0.1
Total Bilirubin: 0.7

## 2011-08-10 LAB — I-STAT 8, (EC8 V) (CONVERTED LAB)
Acid-base deficit: 1
BUN: 34 — ABNORMAL HIGH
Chloride: 107
HCT: 41
Hemoglobin: 13.9
Operator id: 192351
Potassium: 4.1
Sodium: 138

## 2011-08-10 LAB — CK TOTAL AND CKMB (NOT AT ARMC)
Relative Index: INVALID
Total CK: 71

## 2011-08-10 LAB — D-DIMER, QUANTITATIVE (NOT AT ARMC): D-Dimer, Quant: 0.25

## 2011-08-10 LAB — PROTIME-INR: Prothrombin Time: 12.4

## 2011-08-10 LAB — LIPID PANEL
Cholesterol: 186
LDL Cholesterol: 126 — ABNORMAL HIGH
Triglycerides: 156 — ABNORMAL HIGH
VLDL: 31

## 2011-08-10 LAB — POCT I-STAT CREATININE: Creatinine, Ser: 1.1

## 2011-08-10 LAB — TROPONIN I: Troponin I: 0.1 — ABNORMAL HIGH

## 2011-08-10 LAB — HEMOGLOBIN A1C: Mean Plasma Glucose: 161

## 2011-08-22 ENCOUNTER — Other Ambulatory Visit (HOSPITAL_BASED_OUTPATIENT_CLINIC_OR_DEPARTMENT_OTHER): Payer: Medicare Other

## 2011-08-29 ENCOUNTER — Other Ambulatory Visit: Payer: Self-pay | Admitting: Hematology & Oncology

## 2011-08-29 ENCOUNTER — Encounter (HOSPITAL_BASED_OUTPATIENT_CLINIC_OR_DEPARTMENT_OTHER): Payer: Medicare Other | Admitting: Hematology & Oncology

## 2011-08-29 DIAGNOSIS — C8589 Other specified types of non-Hodgkin lymphoma, extranodal and solid organ sites: Secondary | ICD-10-CM

## 2011-08-29 DIAGNOSIS — C8590 Non-Hodgkin lymphoma, unspecified, unspecified site: Secondary | ICD-10-CM

## 2011-08-29 DIAGNOSIS — Z452 Encounter for adjustment and management of vascular access device: Secondary | ICD-10-CM

## 2011-08-29 DIAGNOSIS — E119 Type 2 diabetes mellitus without complications: Secondary | ICD-10-CM

## 2011-08-29 DIAGNOSIS — E559 Vitamin D deficiency, unspecified: Secondary | ICD-10-CM

## 2011-08-29 DIAGNOSIS — Z5111 Encounter for antineoplastic chemotherapy: Secondary | ICD-10-CM

## 2011-08-29 LAB — CBC WITH DIFFERENTIAL (CANCER CENTER ONLY)
BASO#: 0 10*3/uL (ref 0.0–0.2)
EOS%: 2.8 % (ref 0.0–7.0)
Eosinophils Absolute: 0.1 10*3/uL (ref 0.0–0.5)
HCT: 33.7 % — ABNORMAL LOW (ref 34.8–46.6)
HGB: 11.8 g/dL (ref 11.6–15.9)
LYMPH%: 51.7 % — ABNORMAL HIGH (ref 14.0–48.0)
MCH: 32.8 pg (ref 26.0–34.0)
MCHC: 35 g/dL (ref 32.0–36.0)
MCV: 94 fL (ref 81–101)
MONO%: 9.7 % (ref 0.0–13.0)
NEUT#: 1.8 10*3/uL (ref 1.5–6.5)
NEUT%: 35.4 % — ABNORMAL LOW (ref 39.6–80.0)
RBC: 3.6 10*6/uL — ABNORMAL LOW (ref 3.70–5.32)

## 2011-08-30 LAB — COMPREHENSIVE METABOLIC PANEL
Albumin: 4.6 g/dL (ref 3.5–5.2)
Alkaline Phosphatase: 55 U/L (ref 39–117)
BUN: 31 mg/dL — ABNORMAL HIGH (ref 6–23)
Creatinine, Ser: 1.55 mg/dL — ABNORMAL HIGH (ref 0.50–1.10)
Glucose, Bld: 164 mg/dL — ABNORMAL HIGH (ref 70–99)
Potassium: 4.4 mEq/L (ref 3.5–5.3)

## 2011-08-30 LAB — VITAMIN D 25 HYDROXY (VIT D DEFICIENCY, FRACTURES): Vit D, 25-Hydroxy: 70 ng/mL (ref 30–89)

## 2011-09-02 NOTE — Progress Notes (Signed)
CC:   Georga Hacking, M.D. [name], M.D. James L. Malon Kindle., M.D.  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  CURRENT THERAPY:  Observation.  INTERVAL HISTORY:  Ms. Desjardin comes in for followup.  She unfortunately did not make it in sooner.  She had surgery for her right shoulder.  She had rotator cuff surgery.  She had a tough time with this.  She is still getting physical therapy for this.  Her last PET scan was done back in March.  At that point in time, there was no evidence of residual/recurrent disease.  She has noticed some slight swelling on her right neck.  She also has noticed some slight fullness under her arms.  She has had no fever.  She may have some night sweats but she is not sure if this is from her diabetes.  She has had no change in bowel or bladder habits.  She has had no nausea or vomiting.  She has had no rashes.  PHYSICAL EXAMINATION:  General:  This is a well-developed, well- nourished white female in no obvious distress.  Vital signs:  Show temperature of 97.5, pulse 61, respiratory rate 18, blood pressure 123/72.  Weight is 175.  Head and neck:  Exam shows a normocephalic, atraumatic skull.  There are no ocular or oral lesions.  She does have some slight swelling in the right supraclavicular region.  There is some fullness there.  This may be a lymph node.  It measures about 1 x 0.5 cm.  There are no abnormalities in the left neck or left supraclavicular region.  Axillary exam does show some fullness in the right axilla.  I think I can feel a palpable lymph node.  It is mobile.  It is not tender.  It measures about 1 cm.  There is no left axillary adenopathy. Lungs:  Are clear bilaterally.  Cardiac:  Regular rate and rhythm with a normal S1, S2.  There are no murmurs, rubs or bruits.  Abdomen:  Soft with good bowel sounds.  There is no palpable abdominal mass.  There is no fluid wave.  There is no palpable  hepatosplenomegaly.  Back:  No tenderness over the spine, ribs or hips.  Extremities;  shows no clubbing, cyanosis or edema.  She has good range of motion of her joints.  There is a decreased range of motion of her right shoulder from her rotator cuff surgery.  Her arthroscopic scars are well-healed in the right shoulder.  Neurological:  Exam shows no focal neurological deficits.  LABORATORIES:  White blood cell count is 5, hemoglobin 11.8, hematocrit 33.7, platelet count 221.  IMPRESSION:  Ms. Armbrister is a 70 year old white female with history of diffuse large-cell non-Hodgkin's lymphoma.  She had a high IPI score (4).  As such, she is at risk for recurrence.  I think we are going to have to get a PET scan on her.  I think this is critical.  I think this will be the best way for [place] to identify if she does have recurrence.  Again, she is at risk for recurrence and if so we need diagnosis quickly.  If the PET scan is positive then the next step is a biopsy to confirm that this is still the same histologic lymphoma.  I do want to see Ms. Wandel back in another few months.  We will certainly see her back sooner if we find that she does have an abnormal  PET scan.  I spent about a half hour with her today.    ______________________________ Josph Macho, M.D. PRE/MEDQ  D:  08/30/2011  T:  09/02/2011  Job:  [id number]

## 2011-09-05 ENCOUNTER — Ambulatory Visit (HOSPITAL_BASED_OUTPATIENT_CLINIC_OR_DEPARTMENT_OTHER)
Admission: RE | Admit: 2011-09-05 | Discharge: 2011-09-05 | Disposition: A | Discharge status: Home / Self Care | Payer: Medicare Other | Source: Ambulatory Visit | Attending: Hematology & Oncology | Admitting: Hematology & Oncology

## 2011-09-05 ENCOUNTER — Telehealth: Payer: Self-pay | Admitting: *Deleted

## 2011-09-05 DIAGNOSIS — C8589 Other specified types of non-Hodgkin lymphoma, extranodal and solid organ sites: Secondary | ICD-10-CM

## 2011-09-05 DIAGNOSIS — C8590 Non-Hodgkin lymphoma, unspecified, unspecified site: Secondary | ICD-10-CM

## 2011-09-05 MED ORDER — FLUDEOXYGLUCOSE F - 18 (FDG) INJECTION
13.7200 | Freq: Once | INTRAVENOUS | Status: AC | PRN
  Administered 2011-09-05: 13.72 via INTRAVENOUS

## 2011-09-05 NOTE — Telephone Encounter (Signed)
Left message for patient letting her know that her Vit D and labwork is good per dr. Myna Hidalgo

## 2011-09-12 ENCOUNTER — Other Ambulatory Visit: Payer: Self-pay | Admitting: Hematology & Oncology

## 2011-09-12 DIAGNOSIS — C8590 Non-Hodgkin lymphoma, unspecified, unspecified site: Secondary | ICD-10-CM

## 2011-09-14 ENCOUNTER — Telehealth: Payer: Self-pay | Admitting: *Deleted

## 2011-09-14 NOTE — Telephone Encounter (Signed)
Called to find out who put in port for pt and if she wanted local or IV conscience sedation. Per husband Pt will call when she gets home.

## 2011-09-17 ENCOUNTER — Telehealth: Payer: Self-pay | Admitting: *Deleted

## 2011-09-17 NOTE — Telephone Encounter (Signed)
Per Inetta Fermo pt aware of 09-18-11 port removal appointment

## 2011-09-26 ENCOUNTER — Encounter (HOSPITAL_COMMUNITY): Payer: Self-pay | Admitting: Pharmacy Technician

## 2011-09-27 ENCOUNTER — Other Ambulatory Visit: Payer: Self-pay | Admitting: Radiology

## 2011-09-27 MED ORDER — DEXTROSE 5 % IV SOLN: 1.0000 g | Freq: Once | INTRAVENOUS | Status: DC

## 2011-09-28 ENCOUNTER — Inpatient Hospital Stay (HOSPITAL_COMMUNITY): Admission: RE | Admit: 2011-09-28 | Payer: Medicare Other | Source: Ambulatory Visit

## 2011-09-28 ENCOUNTER — Ambulatory Visit (HOSPITAL_COMMUNITY)
Admission: RE | Admit: 2011-09-28 | Discharge: 2011-09-28 | Disposition: A | Discharge status: Home / Self Care | Payer: Medicare Other | Source: Ambulatory Visit | Attending: Hematology & Oncology | Admitting: Hematology & Oncology

## 2011-09-28 VITALS — BP 202/84 | HR 55 | Resp 21

## 2011-09-28 DIAGNOSIS — Z79899 Other long term (current) drug therapy: Secondary | ICD-10-CM | POA: Insufficient documentation

## 2011-09-28 DIAGNOSIS — E785 Hyperlipidemia, unspecified: Secondary | ICD-10-CM | POA: Insufficient documentation

## 2011-09-28 DIAGNOSIS — C8589 Other specified types of non-Hodgkin lymphoma, extranodal and solid organ sites: Secondary | ICD-10-CM | POA: Insufficient documentation

## 2011-09-28 DIAGNOSIS — C8590 Non-Hodgkin lymphoma, unspecified, unspecified site: Secondary | ICD-10-CM

## 2011-09-28 DIAGNOSIS — E119 Type 2 diabetes mellitus without complications: Secondary | ICD-10-CM | POA: Insufficient documentation

## 2011-09-28 DIAGNOSIS — Z7982 Long term (current) use of aspirin: Secondary | ICD-10-CM | POA: Insufficient documentation

## 2011-09-28 DIAGNOSIS — I252 Old myocardial infarction: Secondary | ICD-10-CM | POA: Insufficient documentation

## 2011-09-28 DIAGNOSIS — Z452 Encounter for adjustment and management of vascular access device: Secondary | ICD-10-CM | POA: Insufficient documentation

## 2011-09-28 MED ORDER — SODIUM CHLORIDE 0.9 % IV SOLN: Freq: Once | INTRAVENOUS | Status: DC

## 2011-09-28 MED ORDER — CEFAZOLIN SODIUM 1-5 GM-% IV SOLN
1.0000 g | Freq: Once | INTRAVENOUS | Status: AC
  Administered 2011-09-28: 1 g via INTRAVENOUS
  Filled 2011-09-28: qty 50

## 2011-09-28 MED ORDER — FENTANYL CITRATE 0.05 MG/ML IJ SOLN
INTRAMUSCULAR | Status: AC
  Filled 2011-09-28: qty 4

## 2011-09-28 MED ORDER — FENTANYL CITRATE 0.05 MG/ML IJ SOLN
INTRAMUSCULAR | Status: AC | PRN
  Administered 2011-09-28 (×2): 100 ug via INTRAVENOUS

## 2011-09-28 NOTE — ED Notes (Signed)
Patient denies pain and is resting comfortably.  

## 2011-09-28 NOTE — Procedures (Signed)
Successful removal of port-a-cath.  No immediate complications.

## 2011-09-28 NOTE — H&P (Signed)
[name] is an 70 y.o. female.   Chief Complaint: removal of tunneled access port HPI: [name] who has completed chemotherapy and is now here for removal of tunneled access port.  Past Medical History  Diagnosis Date  . Cancer   Recent MI and heart cath - Dr. Donnie [name]. No recent chest pain. Very remote tobacco history. Diabetes Hyperlipidemia   No past surgical history on file.  No family history on file. Social History:  does not have a smoking history on file. She does not have any smokeless tobacco history on file. Her alcohol and drug histories not on file.  Allergies:  Allergies  Allergen Reactions  . Morphine And Related Other (See Comments)    "made me crazy"  . Sulfa Antibiotics Other (See Comments)    Can't remember     Medications Prior to Admission  Medication Sig Dispense Refill  . aspirin EC 81 MG tablet Take 81 mg by mouth daily.        . cetirizine (ZYRTEC) 10 MG tablet Take 10 mg by mouth daily.        . cholecalciferol (VITAMIN D) 1000 UNITS tablet Take 2,000 Units by mouth daily.        . dabigatran (PRADAXA) 75 MG CAPS Take 75 mg by mouth 2 (two) times daily.        Marland Kitchen esomeprazole (NEXIUM) 40 MG capsule Take 40 mg by mouth daily.        . furosemide (LASIX) 40 MG tablet Take 40 mg by mouth daily.        Marland Kitchen glimepiride (AMARYL) 4 MG tablet Take 4 mg by mouth daily.        Marland Kitchen lisinopril (PRINIVIL,ZESTRIL) 20 MG tablet Take 20 mg by mouth every morning.        . metoprolol (LOPRESSOR) 50 MG tablet Take 50 mg by mouth 2 (two) times daily.        . nabumetone (RELAFEN) 500 MG tablet Take 500 mg by mouth 2 (two) times daily.        . rosuvastatin (CRESTOR) 5 MG tablet Take 5 mg by mouth at bedtime.        Marland Kitchen spironolactone (ALDACTONE) 25 MG tablet Take 25 mg by mouth daily.        . traMADol (ULTRAM) 50 MG tablet Take 50 mg by mouth every 6 (six) hours as needed. For pain. Maximum dose= 8 tablets per day       . vitamin C (ASCORBIC ACID) 500 MG  tablet Take 500 mg by mouth daily.         Medications Prior to Admission  Medication Dose Route Frequency Provider Last Rate Last Dose  . 0.9 %  sodium chloride infusion   Intravenous Once Robet Leu, PA      . ceFAZolin (ANCEF) IVPB 1 g/50 mL premix  1 g Intravenous Once Simonne Come      . DISCONTD: ceFAZolin (ANCEF) 1 g in dextrose 5 % 50 mL IVPB  1 g Intravenous Once Robet Leu, PA        No results found for this or any previous visit (from the past 48 hour(s)). No results found.  Review of Systems  Constitutional: Negative for fever and chills.  Respiratory: Negative for shortness of breath.   Cardiovascular: Negative for chest pain.  Endo/Heme/Allergies: Does not bruise/bleed easily.    Blood pressure 178/58, pulse 52, SpO2 100.00%. Physical Exam Heent - unremarkable Heart - RRR  Lungs - Clear Abd - NT Airway - 1  Assessment/Plan Informed consent obtained for removal of port - chemotherapy completed.  [name] 09/28/2011, 1:52 PM

## 2011-11-08 ENCOUNTER — Other Ambulatory Visit: Payer: Self-pay | Admitting: Family Medicine

## 2011-11-08 ENCOUNTER — Ambulatory Visit
Admission: RE | Admit: 2011-11-08 | Discharge: 2011-11-08 | Disposition: A | Discharge status: Home / Self Care | Payer: Medicare Other | Source: Ambulatory Visit | Attending: Family Medicine | Admitting: Family Medicine

## 2011-11-08 DIAGNOSIS — R52 Pain, unspecified: Secondary | ICD-10-CM

## 2012-02-13 ENCOUNTER — Other Ambulatory Visit (HOSPITAL_BASED_OUTPATIENT_CLINIC_OR_DEPARTMENT_OTHER): Payer: Medicare Other | Admitting: Lab

## 2012-02-13 ENCOUNTER — Ambulatory Visit (HOSPITAL_BASED_OUTPATIENT_CLINIC_OR_DEPARTMENT_OTHER): Payer: Medicare Other | Admitting: Hematology & Oncology

## 2012-02-13 VITALS — BP 117/66 | HR 58 | Temp 97.6°F | Ht 62.0 in | Wt 181.0 lb

## 2012-02-13 DIAGNOSIS — D649 Anemia, unspecified: Secondary | ICD-10-CM

## 2012-02-13 DIAGNOSIS — I480 Paroxysmal atrial fibrillation: Secondary | ICD-10-CM

## 2012-02-13 DIAGNOSIS — E119 Type 2 diabetes mellitus without complications: Secondary | ICD-10-CM

## 2012-02-13 DIAGNOSIS — C8590 Non-Hodgkin lymphoma, unspecified, unspecified site: Secondary | ICD-10-CM

## 2012-02-13 DIAGNOSIS — C8589 Other specified types of non-Hodgkin lymphoma, extranodal and solid organ sites: Secondary | ICD-10-CM

## 2012-02-13 DIAGNOSIS — I4891 Unspecified atrial fibrillation: Secondary | ICD-10-CM

## 2012-02-13 DIAGNOSIS — N189 Chronic kidney disease, unspecified: Secondary | ICD-10-CM

## 2012-02-13 DIAGNOSIS — D509 Iron deficiency anemia, unspecified: Secondary | ICD-10-CM

## 2012-02-13 LAB — CBC WITH DIFFERENTIAL (CANCER CENTER ONLY)
BASO#: 0 10*3/uL (ref 0.0–0.2)
BASO%: 0.4 % (ref 0.0–2.0)
EOS%: 3.6 % (ref 0.0–7.0)
HGB: 10.8 g/dL — ABNORMAL LOW (ref 11.6–15.9)
MCH: 32.1 pg (ref 26.0–34.0)
MCHC: 33.9 g/dL (ref 32.0–36.0)
MONO%: 9.6 % (ref 0.0–13.0)
NEUT#: 1.6 10*3/uL (ref 1.5–6.5)
NEUT%: 33.7 % — ABNORMAL LOW (ref 39.6–80.0)
RDW: 13 % (ref 11.1–15.7)

## 2012-02-13 NOTE — Progress Notes (Signed)
This office note has been dictated.

## 2012-02-14 LAB — COMPREHENSIVE METABOLIC PANEL
AST: 14 U/L (ref 0–37)
Alkaline Phosphatase: 53 U/L (ref 39–117)
BUN: 43 mg/dL — ABNORMAL HIGH (ref 6–23)
Creatinine, Ser: 1.88 mg/dL — ABNORMAL HIGH (ref 0.50–1.10)

## 2012-02-14 NOTE — Progress Notes (Signed)
CC:   Georga Hacking, M.D. [name], M.D. James L. Malon Kindle., M.D.  DIAGNOSES: 1. 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 2. Paroxysmal atrial fibrillation.  CURRENT THERAPY:  Pradaxa 150 mg p.o. b.i.d.  INTERIM HISTORY:  [name] comes in for followup.  She is feeling fairly well.  Her blood sugars have been doing okay.  We last saw her back in November.  She has gotten over her rotator cuff surgery for her right shoulder.  Her last PET scan was done back in November.  The PET scan showed no evidence of recurrent lymphoma.  She has had no obvious bleeding.  There has been no change in bowel or bladder habits.  She has had no leg swelling.  There have been no rashes.  She has had no headache.  She does take her blood sugar medications. Again, she does note that her blood sugars have been doing pretty well.  PHYSICAL EXAMINATION:  This is a well-developed, well-nourished white female in no obvious distress.  Vital signs:  Temperature 97.6, pulse 58, respiratory rate 18, blood pressure 117/66.  Weight is 181.  Head and neck exam shows a normocephalic, atraumatic skull.  There are no ocular or oral lesions.  There are no palpable cervical or supraclavicular lymph nodes.  Lungs:  Clear bilaterally.  Cardiac: Regular rate and rhythm with a normal, with a normal S1 and S2.  There are no murmurs, rubs or bruits.  Abdomen:  Soft with good bowel sounds. There is no palpable abdominal mass.  There is no fluid wave.  There is no palpable hepatosplenomegaly.  Extremities:  No clubbing, cyanosis or edema.  Neurologic:  No focal neurological deficits.  LABORATORY STUDIES:  White cell count is 4.7, hemoglobin 10.8, hematocrit 31.9, platelet count 180.  IMPRESSION:  [name] is a 71 year old white female with history of diffuse large-cell lymphoma.  She completed her chemotherapy back in August 2011.  The anemia, I think, is multifactorial.  I did look  at the blood under the microscope.  I really did not see anything that looked unusual. There were no nucleated red blood cells.  I saw no schistocytes.  There is no rouleaux formation.  I do want to see back in 3 months' time so I can followup on the anemia. I think the anemia is reflective of her diabetes and likely chronic renal insufficiency.  I suspect that her erythropoietin level will be on the low side.  We will see back in 3 months.  ADDENDUM:  I told Ms. Heine that she likely will need Pradaxa for life because of paroxysmal atrial fibrillation.    ______________________________ Josph Macho, M.D. PRE/MEDQ  D:  02/13/2012  T:  02/14/2012  Job:  [id number]

## 2012-02-15 ENCOUNTER — Telehealth: Payer: Self-pay | Admitting: *Deleted

## 2012-02-15 NOTE — Telephone Encounter (Addendum)
Message copied by Mirian Capuchin on Fri Feb 15, 2012  3:56 PM ------      Message from: Arlan Organ R      Created: Thu Feb 14, 2012  6:09 PM       Call- labs are ok except that kidney function is a little worse.  This needs to be evaluated by family md.  Make sure that her primary MD has these results. [name] This message called to pt.  States she sees Dr. Lowell Guitar for this problem and has an apt with him 02/22/12.  Pt's labs faxed to Dr. Lowell Guitar.

## 2012-02-22 ENCOUNTER — Telehealth: Payer: Self-pay | Admitting: *Deleted

## 2012-02-22 NOTE — Telephone Encounter (Signed)
Message copied by Anselm Jungling on Fri Feb 22, 2012  2:29 PM ------      Message from: Arlan Organ R      Created: Thu Feb 14, 2012  6:09 PM       Call- labs are ok except that kidney function is a little worse.  This needs to be evaluated by family md.  Make sure that her primary MD has these results. Cindee Lame

## 2012-02-22 NOTE — Telephone Encounter (Signed)
Called patient and left message on personal home answering machine to let her know that her labs were ok except kidney function is a little worse and needs to be evaluated by family practice physician.  Results of bloodwork faxed to Dr. Shaune Pollack with note to make sure office follows up on labwork

## 2012-04-28 ENCOUNTER — Other Ambulatory Visit: Payer: Self-pay | Admitting: Family Medicine

## 2012-04-28 DIAGNOSIS — Z1231 Encounter for screening mammogram for malignant neoplasm of breast: Secondary | ICD-10-CM

## 2012-05-05 ENCOUNTER — Other Ambulatory Visit (HOSPITAL_BASED_OUTPATIENT_CLINIC_OR_DEPARTMENT_OTHER): Payer: Medicare Other | Admitting: Lab

## 2012-05-05 ENCOUNTER — Ambulatory Visit (HOSPITAL_BASED_OUTPATIENT_CLINIC_OR_DEPARTMENT_OTHER): Payer: Medicare Other | Admitting: Hematology & Oncology

## 2012-05-05 VITALS — BP 138/73 | HR 60 | Temp 97.2°F | Ht 62.0 in | Wt 180.0 lb

## 2012-05-05 DIAGNOSIS — C8590 Non-Hodgkin lymphoma, unspecified, unspecified site: Secondary | ICD-10-CM

## 2012-05-05 DIAGNOSIS — D649 Anemia, unspecified: Secondary | ICD-10-CM

## 2012-05-05 DIAGNOSIS — I4891 Unspecified atrial fibrillation: Secondary | ICD-10-CM

## 2012-05-05 DIAGNOSIS — N189 Chronic kidney disease, unspecified: Secondary | ICD-10-CM

## 2012-05-05 DIAGNOSIS — I480 Paroxysmal atrial fibrillation: Secondary | ICD-10-CM

## 2012-05-05 DIAGNOSIS — D631 Anemia in chronic kidney disease: Secondary | ICD-10-CM

## 2012-05-05 DIAGNOSIS — C8589 Other specified types of non-Hodgkin lymphoma, extranodal and solid organ sites: Secondary | ICD-10-CM

## 2012-05-05 DIAGNOSIS — E119 Type 2 diabetes mellitus without complications: Secondary | ICD-10-CM

## 2012-05-05 DIAGNOSIS — D509 Iron deficiency anemia, unspecified: Secondary | ICD-10-CM

## 2012-05-05 LAB — CBC WITH DIFFERENTIAL (CANCER CENTER ONLY)
BASO#: 0 10*3/uL (ref 0.0–0.2)
BASO%: 0.3 % (ref 0.0–2.0)
EOS%: 4 % (ref 0.0–7.0)
MCH: 30.3 pg (ref 26.0–34.0)
MCHC: 32.9 g/dL (ref 32.0–36.0)
MONO%: 10.8 % (ref 0.0–13.0)
NEUT#: 2.4 10*3/uL (ref 1.5–6.5)
Platelets: 271 10*3/uL (ref 145–400)
RDW: 13.1 % (ref 11.1–15.7)

## 2012-05-05 NOTE — Progress Notes (Signed)
This office note has been dictated.

## 2012-05-05 NOTE — Progress Notes (Signed)
CC:   [name], M.D. Georga Hacking, M.D. James L. Malon Kindle., M.D. [facility]  DIAGNOSES: 1. Diffuse large cell non-Hodgkin's lymphoma, clinical remission. 2. Paroxysmal atrial fibrillation.  CURRENT THERAPY:  Pradaxa 150 mg p.o. b.i.d.  INTERIM HISTORY:  [name] comes in for followup.  She is doing okay. Unfortunately, her diabetes I think is becoming more of an issue.  She is developing more in the way of renal insufficiency.  Along with this, is the fact that I think there is some degree of anemia that she is developing.  She feels tired on occasion.  She has had no obvious bleeding.  She has not noticed any problems with nausea or vomiting.  Appetite has been okay.  When we last saw her in April, her hemoglobin was 10.8.  We are watching her hemoglobin closely.  I am checking iron studies and erythropoietin level on her today.  Her husband, unfortunately, is not doing too well right now.  He is having his own health issues.  PHYSICAL EXAMINATION:  General:  This is a fairly well-developed, well- nourished white female in no obvious distress.  Vital signs: Temperature 97.4, pulse 60, respiratory rate 20, blood pressure 138/73. Weight is 180.  Head and neck:  Exam shows a normocephalic, atraumatic skull.  There are no ocular or oral lesions.  There are no palpable cervical or supraclavicular lymph nodes.  Lungs:  Clear bilaterally. Cardiac:  Regular rate and rhythm with a normal S1 and S2.  There are no murmurs, rubs or bruits.  Abdomen:  Soft with good bowel sounds.  There is no palpable abdominal mass.  There is no palpable hepatosplenomegaly. Back:  No tenderness over the spine, ribs or hips.  Extremities:  Shows no clubbing, cyanosis or edema.  Neurological:  Shows no focal neurological deficits.  Skin:  No rashes, ecchymoses or petechiae.  LABORATORY STUDIES:  White cell count 5.7, hemoglobin 10.9, hematocrit 32.8, platelet count 271.   MCV is 92.  IMPRESSION:  Ms. Rylander is a 71 year old white female with history of diffuse large cell non-Hodgkin's lymphoma.  She was treated with a cycle of chemotherapy with R-CHOP.  She went into remission.  She completed treatments back in August of 2011.  Again, I do not see any evidence of lymphoma recurrence.  I really believe that anemia is going to be a problem for Ms. Rosado in the future.  Her renal function is suffering a little bit.  I really suspect that her erythropoietin level will be down.  I would not think that she is iron deficient but we are checking her iron stores.  I want to see Ms. Rolston back in about 3 months.  It would not surprise me, if at that point, we would have to get her set up with Aranesp injections.    ______________________________ Josph Macho, M.D. PRE/MEDQ  D:  05/05/2012  T:  05/05/2012  Job:  [id number]

## 2012-05-07 LAB — IGG, IGA, IGM
IgA: 80 mg/dL (ref 69–380)
IgG (Immunoglobin G), Serum: 570 mg/dL — ABNORMAL LOW (ref 690–1700)
IgM, Serum: 30 mg/dL — ABNORMAL LOW (ref 52–322)

## 2012-05-07 LAB — PROTEIN ELECTROPHORESIS, SERUM, WITH REFLEX
Albumin ELP: 58.1 % (ref 55.8–66.1)
Alpha-1-Globulin: 5.6 % — ABNORMAL HIGH (ref 2.9–4.9)
Alpha-2-Globulin: 17.9 % — ABNORMAL HIGH (ref 7.1–11.8)
Beta 2: 5.3 % (ref 3.2–6.5)
Gamma Globulin: 7.8 % — ABNORMAL LOW (ref 11.1–18.8)

## 2012-05-07 LAB — FERRITIN: Ferritin: 228 ng/mL (ref 10–291)

## 2012-05-07 LAB — IRON AND TIBC
%SAT: 13 % — ABNORMAL LOW (ref 20–55)
Iron: 33 ug/dL — ABNORMAL LOW (ref 42–145)
TIBC: 259 ug/dL (ref 250–470)
UIBC: 226 ug/dL (ref 125–400)

## 2012-05-07 LAB — IFE INTERPRETATION

## 2012-05-09 ENCOUNTER — Other Ambulatory Visit: Payer: Self-pay | Admitting: *Deleted

## 2012-05-09 ENCOUNTER — Telehealth: Payer: Self-pay | Admitting: *Deleted

## 2012-05-09 DIAGNOSIS — D509 Iron deficiency anemia, unspecified: Secondary | ICD-10-CM

## 2012-05-09 NOTE — Telephone Encounter (Signed)
Message copied by Anselm Jungling on Fri May 09, 2012  3:03 PM ------      Message from: Arlan Organ R      Created: Wed May 07, 2012  6:27 PM       Please call her and tell her that her iron levels were dropping. Set her up with Feraheme at 1020 mg times one dose. Thanks. Cindee Lame

## 2012-05-09 NOTE — Telephone Encounter (Signed)
Called patient to let her know her iron levels were low per dr. Lupita Leash.  appt set up with scheduler

## 2012-05-13 ENCOUNTER — Ambulatory Visit (HOSPITAL_BASED_OUTPATIENT_CLINIC_OR_DEPARTMENT_OTHER): Payer: Medicare Other

## 2012-05-13 VITALS — BP 131/70 | HR 49 | Temp 97.1°F

## 2012-05-13 DIAGNOSIS — D509 Iron deficiency anemia, unspecified: Secondary | ICD-10-CM

## 2012-05-13 MED ORDER — SODIUM CHLORIDE 0.9 % IV SOLN
INTRAVENOUS | Status: DC
  Administered 2012-05-13: 12:00:00 via INTRAVENOUS

## 2012-05-13 MED ORDER — SODIUM CHLORIDE 0.9 % IV SOLN
1020.0000 mg | Freq: Once | INTRAVENOUS | Status: AC
  Administered 2012-05-13: 1020 mg via INTRAVENOUS
  Filled 2012-05-13: qty 34

## 2012-05-13 NOTE — Patient Instructions (Signed)
Ferumoxytol injection What is this medicine? FERUMOXYTOL is an iron complex. Iron is used to make healthy red blood cells, which carry oxygen and nutrients throughout the body. This medicine is used to treat iron deficiency anemia in people with chronic kidney disease. This medicine may be used for other purposes; ask your health care provider or pharmacist if you have questions. What should I tell my health care provider before I take this medicine? They need to know if you have any of these conditions: -anemia not caused by low iron levels -high levels of iron in the blood -magnetic resonance imaging (MRI) test scheduled -an unusual or allergic reaction to iron, other medicines, foods, dyes, or preservatives -pregnant or trying to get pregnant -breast-feeding How should I use this medicine? This medicine is for infusion into a vein. It is given by a health care professional in a hospital or clinic setting. Talk to your pediatrician regarding the use of this medicine in children. Special care may be needed. Overdosage: If you think you've taken too much of this medicine contact a poison control center or emergency room at once. Overdosage: If you think you have taken too much of this medicine contact a poison control center or emergency room at once. NOTE: This medicine is only for you. Do not share this medicine with others. What if I miss a dose? It is important not to miss your dose. Call your doctor or health care professional if you are unable to keep an appointment. What may interact with this medicine? This medicine may interact with the following medications: -other iron products This list may not describe all possible interactions. Give your health care provider a list of all the medicines, herbs, non-prescription drugs, or dietary supplements you use. Also tell them if you smoke, drink alcohol, or use illegal drugs. Some items may interact with your medicine. What should I watch  for while using this medicine? Visit your doctor or healthcare professional regularly. Tell your doctor or healthcare professional if your symptoms do not start to get better or if they get worse. You may need blood work done while you are taking this medicine. You may need to follow a special diet. Talk to your doctor. Foods that contain iron include: whole grains/cereals, dried fruits, beans, or peas, leafy green vegetables, and organ meats (liver, kidney). What side effects may I notice from receiving this medicine? Side effects that you should report to your doctor or health care professional as soon as possible: -allergic reactions like skin rash, itching or hives, swelling of the face, lips, or tongue -breathing problems -changes in blood pressure -feeling faint or lightheaded, falls -fever or chills -flushing, sweating, or hot feelings -swelling of the ankles or feet Side effects that usually do not require medical attention (Report these to your doctor or health care professional if they continue or are bothersome.): -diarrhea -headache -nausea, vomiting -stomach pain This list may not describe all possible side effects. Call your doctor for medical advice about side effects. You may report side effects to FDA at 1-800-FDA-1088. Where should I keep my medicine? This drug is given in a hospital or clinic and will not be stored at home. NOTE: This sheet is a summary. It may not cover all possible information. If you have questions about this medicine, talk to your doctor, pharmacist, or health care provider.  2012, Elsevier/Gold Standard. (07/07/2008 9:48:25 PM) 

## 2012-05-16 ENCOUNTER — Ambulatory Visit
Admission: RE | Admit: 2012-05-16 | Discharge: 2012-05-16 | Disposition: A | Discharge status: Home / Self Care | Payer: Medicare Other | Source: Ambulatory Visit | Attending: Family Medicine | Admitting: Family Medicine

## 2012-05-16 DIAGNOSIS — Z1231 Encounter for screening mammogram for malignant neoplasm of breast: Secondary | ICD-10-CM

## 2012-08-14 ENCOUNTER — Other Ambulatory Visit (HOSPITAL_BASED_OUTPATIENT_CLINIC_OR_DEPARTMENT_OTHER): Payer: Medicare Other | Admitting: Lab

## 2012-08-14 ENCOUNTER — Ambulatory Visit (HOSPITAL_BASED_OUTPATIENT_CLINIC_OR_DEPARTMENT_OTHER): Payer: Medicare Other | Admitting: Medical

## 2012-08-14 VITALS — BP 129/43 | HR 54 | Temp 97.6°F | Resp 16 | Ht 62.0 in | Wt 179.0 lb

## 2012-08-14 DIAGNOSIS — D631 Anemia in chronic kidney disease: Secondary | ICD-10-CM

## 2012-08-14 DIAGNOSIS — C8589 Other specified types of non-Hodgkin lymphoma, extranodal and solid organ sites: Secondary | ICD-10-CM

## 2012-08-14 DIAGNOSIS — E119 Type 2 diabetes mellitus without complications: Secondary | ICD-10-CM

## 2012-08-14 DIAGNOSIS — N189 Chronic kidney disease, unspecified: Secondary | ICD-10-CM

## 2012-08-14 DIAGNOSIS — D509 Iron deficiency anemia, unspecified: Secondary | ICD-10-CM

## 2012-08-14 DIAGNOSIS — C8590 Non-Hodgkin lymphoma, unspecified, unspecified site: Secondary | ICD-10-CM

## 2012-08-14 DIAGNOSIS — C8330 Diffuse large B-cell lymphoma, unspecified site: Secondary | ICD-10-CM | POA: Insufficient documentation

## 2012-08-14 LAB — CBC WITH DIFFERENTIAL (CANCER CENTER ONLY)
BASO%: 0.4 % (ref 0.0–2.0)
LYMPH%: 39.1 % (ref 14.0–48.0)
MCH: 28.7 pg (ref 26.0–34.0)
MCV: 88 fL (ref 81–101)
MONO%: 10.4 % (ref 0.0–13.0)
NEUT#: 2.3 10*3/uL (ref 1.5–6.5)
Platelets: 273 10*3/uL (ref 145–400)
RDW: 14.9 % (ref 11.1–15.7)
WBC: 5 10*3/uL (ref 3.9–10.0)

## 2012-08-14 NOTE — Progress Notes (Signed)
Diagnoses: #1.  Diffuse large cell non-Hodgkin's lymphoma, clinical remission. #2 proximal atrial fibrillation. #3, iron deficiency anemia.  Last IV iron infusion was on 05/13/2012 with Feraheme, 1,020mg   Current therapy: Pradaxa 75mg  by mouth twice a day  Interim history: [name] presents today for an office followup visit.  Overall, she, reports, that she's been doing relatively well.  Should we did have to transfuse her with IV iron.  Back on 05/13/2012.  Her iron was 33, with 13% saturation.  She still continues to report some fatigue.  I do believe.  She is developing some renal insufficiency.  Her last creatinine was 1.88.  Her erythropoietin level was 89.4.  We should probably go ahead and get a retic count  She is diabetic and reports, that her sugars have been under good control.  She denies any obvious, or abnormal bleeding.  She reports, she has a good appetite.  She denies any nausea, vomiting, diarrhea, or constipation.  She denies any cough, chest pain, shortness of breath.  She denies any fevers, chills, or night sweats.  She denies any palpable lymph nodes.  She denies any odynophagia or dysphasia.  She denies any lower leg swelling.   Review of Systems: Pt. Denies any changes in their vision, hearing, adenopathy, fevers, chills, nausea, vomiting, diarrhea, constipation, chest pain, shortness of breath, passing blood, passing out, blacking out,  any changes in skin, joints, neurologic or psychiatric except as noted.  Physical Exam: This is a pleasant, 71 year old, fairly well-developed, well-nourished, white female, in no obvious distress Vitals: Temperature 97.6 degrees, pulse 54, respirations 16, blood pressure 129/43.  Weight 179 pounds HEENT reveals a normocephalic, atraumatic skull, no scleral icterus, no oral lesions  Neck is supple without any cervical or supraclavicular adenopathy.  Lungs are clear to auscultation bilaterally. There are no wheezes, rales or rhonci Cardiac  is regular rate and rhythm with a normal S1 and S2. There are no murmurs, rubs, or bruits.  Abdomen is soft with good bowel sounds, there is no palpable mass. There is no palpable hepatosplenomegaly. There is no palpable fluid wave.  Musculoskeletal no tenderness of the spine, ribs, or hips.  Extremities there are no clubbing, cyanosis, or edema.  Skin no petechia, purpura or ecchymosis Neurologic is nonfocal.  Laboratory Data: White count 5.0, hemoglobin 9.4, hematocrit 20.9, platelets 273,000  Current Outpatient Prescriptions on File Prior to Visit  Medication Sig Dispense Refill  . amLODipine (NORVASC) 5 MG tablet Take 5 mg by mouth daily. Pt takes 1 plus 1/2 of another - 7.5 mg.      . aspirin EC 81 MG tablet Take 81 mg by mouth daily.        . cholecalciferol (VITAMIN D) 1000 UNITS tablet Take 2,000 Units by mouth daily.        . dabigatran (PRADAXA) 75 MG CAPS Take 75 mg by mouth 2 (two) times daily.        Marland Kitchen esomeprazole (NEXIUM) 40 MG capsule Take 40 mg by mouth daily.        . furosemide (LASIX) 40 MG tablet Take 40 mg by mouth daily.        Marland Kitchen glimepiride (AMARYL) 4 MG tablet Take 4 mg by mouth daily.        Marland Kitchen lisinopril (PRINIVIL,ZESTRIL) 20 MG tablet Take 20 mg by mouth every morning.        . metoprolol (LOPRESSOR) 50 MG tablet Take 50 mg by mouth 2 (two) times daily.        Marland Kitchen  Omega-3 Fatty Acids (FISH OIL) 300 MG CAPS Take by mouth every morning.      . rosuvastatin (CRESTOR) 5 MG tablet Take 10 mg by mouth at bedtime.       . traMADol (ULTRAM) 50 MG tablet Take 50 mg by mouth every 6 (six) hours as needed. For pain. Maximum dose= 8 tablets per day       . vitamin C (ASCORBIC ACID) 500 MG tablet Take 500 mg by mouth daily.         Assessment/Plan: This is a pleasant, 71 year old, white female, with the following issues:  #1.  History of diffuse large cell non-Hodgkin's lymphoma.  She was treated with R.-CHOP.  She went to remission.  She completed treatment back in August  2011.  At this time, I do not see any evidence of lymphoma recurrence.  #2 anemia.  She is still quite anemic despite her iron transfusion back in July.  Her anemia could be a byproduct of her renal insufficiency.  We will go ahead and did a retic .  Count on her and see what that comes back as.  Hopefully, we will be able to give her Aranesp for the anemia.  #3 followup.  [name] will follow back up with [place] in one month, but before then should there be questions or concerns.

## 2012-08-15 LAB — ERYTHROPOIETIN: Erythropoietin: 199 m[IU]/mL — ABNORMAL HIGH (ref 2.6–34.0)

## 2012-08-15 LAB — COMPREHENSIVE METABOLIC PANEL
ALT: 10 U/L (ref 0–35)
BUN: 25 mg/dL — ABNORMAL HIGH (ref 6–23)
CO2: 29 mEq/L (ref 19–32)
Calcium: 8.7 mg/dL (ref 8.4–10.5)
Chloride: 102 mEq/L (ref 96–112)
Creatinine, Ser: 1.43 mg/dL — ABNORMAL HIGH (ref 0.50–1.10)
Glucose, Bld: 189 mg/dL — ABNORMAL HIGH (ref 70–99)

## 2012-08-15 LAB — FERRITIN: Ferritin: 189 ng/mL (ref 10–291)

## 2012-08-15 LAB — RETICULOCYTES (CHCC): Retic Ct Pct: 2.3 % (ref 0.4–2.3)

## 2012-08-20 ENCOUNTER — Other Ambulatory Visit: Payer: Self-pay | Admitting: Medical

## 2012-08-20 ENCOUNTER — Telehealth: Payer: Self-pay | Admitting: Hematology & Oncology

## 2012-08-20 NOTE — Telephone Encounter (Signed)
Pt aware of 08-25-12 appointment. Ari aware to precert

## 2012-08-25 ENCOUNTER — Ambulatory Visit (HOSPITAL_BASED_OUTPATIENT_CLINIC_OR_DEPARTMENT_OTHER): Payer: Medicare Other

## 2012-08-25 VITALS — BP 133/54 | HR 52 | Temp 98.2°F | Resp 16

## 2012-08-25 DIAGNOSIS — D509 Iron deficiency anemia, unspecified: Secondary | ICD-10-CM

## 2012-08-25 MED ORDER — SODIUM CHLORIDE 0.9 % IV SOLN
Freq: Once | INTRAVENOUS | Status: AC
  Administered 2012-08-25: 13:00:00 via INTRAVENOUS

## 2012-08-25 MED ORDER — SODIUM CHLORIDE 0.9 % IV SOLN
1020.0000 mg | Freq: Once | INTRAVENOUS | Status: AC
  Administered 2012-08-25: 1020 mg via INTRAVENOUS
  Filled 2012-08-25: qty 34

## 2012-08-25 NOTE — Patient Instructions (Signed)
Ferumoxytol injection What is this medicine? FERUMOXYTOL is an iron complex. Iron is used to make healthy red blood cells, which carry oxygen and nutrients throughout the body. This medicine is used to treat iron deficiency anemia in people with chronic kidney disease. This medicine may be used for other purposes; ask your health care provider or pharmacist if you have questions. What should I tell my health care provider before I take this medicine? They need to know if you have any of these conditions: -anemia not caused by low iron levels -high levels of iron in the blood -magnetic resonance imaging (MRI) test scheduled -an unusual or allergic reaction to iron, other medicines, foods, dyes, or preservatives -pregnant or trying to get pregnant -breast-feeding How should I use this medicine? This medicine is for infusion into a vein. It is given by a health care professional in a hospital or clinic setting. Talk to your pediatrician regarding the use of this medicine in children. Special care may be needed. Overdosage: If you think you've taken too much of this medicine contact a poison control center or emergency room at once. Overdosage: If you think you have taken too much of this medicine contact a poison control center or emergency room at once. NOTE: This medicine is only for you. Do not share this medicine with others. What if I miss a dose? It is important not to miss your dose. Call your doctor or health care professional if you are unable to keep an appointment. What may interact with this medicine? This medicine may interact with the following medications: -other iron products This list may not describe all possible interactions. Give your health care provider a list of all the medicines, herbs, non-prescription drugs, or dietary supplements you use. Also tell them if you smoke, drink alcohol, or use illegal drugs. Some items may interact with your medicine. What should I watch  for while using this medicine? Visit your doctor or healthcare professional regularly. Tell your doctor or healthcare professional if your symptoms do not start to get better or if they get worse. You may need blood work done while you are taking this medicine. You may need to follow a special diet. Talk to your doctor. Foods that contain iron include: whole grains/cereals, dried fruits, beans, or peas, leafy green vegetables, and organ meats (liver, kidney). What side effects may I notice from receiving this medicine? Side effects that you should report to your doctor or health care professional as soon as possible: -allergic reactions like skin rash, itching or hives, swelling of the face, lips, or tongue -breathing problems -changes in blood pressure -feeling faint or lightheaded, falls -fever or chills -flushing, sweating, or hot feelings -swelling of the ankles or feet Side effects that usually do not require medical attention (Report these to your doctor or health care professional if they continue or are bothersome.): -diarrhea -headache -nausea, vomiting -stomach pain This list may not describe all possible side effects. Call your doctor for medical advice about side effects. You may report side effects to FDA at 1-800-FDA-1088. Where should I keep my medicine? This drug is given in a hospital or clinic and will not be stored at home. NOTE: This sheet is a summary. It may not cover all possible information. If you have questions about this medicine, talk to your doctor, pharmacist, or health care provider.  2012, Elsevier/Gold Standard. (07/07/2008 9:48:25 PM) 

## 2012-09-10 ENCOUNTER — Other Ambulatory Visit (HOSPITAL_BASED_OUTPATIENT_CLINIC_OR_DEPARTMENT_OTHER): Payer: Medicare Other | Admitting: Lab

## 2012-09-10 ENCOUNTER — Other Ambulatory Visit: Payer: Self-pay | Admitting: Medical

## 2012-09-10 DIAGNOSIS — D649 Anemia, unspecified: Secondary | ICD-10-CM

## 2012-09-10 DIAGNOSIS — D509 Iron deficiency anemia, unspecified: Secondary | ICD-10-CM

## 2012-09-17 ENCOUNTER — Encounter: Payer: Self-pay | Admitting: Hematology & Oncology

## 2012-09-17 ENCOUNTER — Other Ambulatory Visit (HOSPITAL_BASED_OUTPATIENT_CLINIC_OR_DEPARTMENT_OTHER): Payer: Medicare Other | Admitting: Lab

## 2012-09-17 ENCOUNTER — Ambulatory Visit (HOSPITAL_BASED_OUTPATIENT_CLINIC_OR_DEPARTMENT_OTHER): Payer: Medicare Other | Admitting: Hematology & Oncology

## 2012-09-17 VITALS — BP 127/48 | HR 57 | Temp 98.1°F | Resp 18 | Ht 62.0 in | Wt 179.0 lb

## 2012-09-17 DIAGNOSIS — I4891 Unspecified atrial fibrillation: Secondary | ICD-10-CM

## 2012-09-17 DIAGNOSIS — C8589 Other specified types of non-Hodgkin lymphoma, extranodal and solid organ sites: Secondary | ICD-10-CM

## 2012-09-17 DIAGNOSIS — D509 Iron deficiency anemia, unspecified: Secondary | ICD-10-CM

## 2012-09-17 DIAGNOSIS — D631 Anemia in chronic kidney disease: Secondary | ICD-10-CM

## 2012-09-17 DIAGNOSIS — K921 Melena: Secondary | ICD-10-CM

## 2012-09-17 DIAGNOSIS — N189 Chronic kidney disease, unspecified: Secondary | ICD-10-CM

## 2012-09-17 LAB — RETICULOCYTES (CHCC)
ABS Retic: 96.3 10*3/uL (ref 19.0–186.0)
RBC.: 3.44 MIL/uL — ABNORMAL LOW (ref 3.87–5.11)

## 2012-09-17 LAB — CBC WITH DIFFERENTIAL (CANCER CENTER ONLY)
BASO#: 0 10*3/uL (ref 0.0–0.2)
BASO%: 0.4 % (ref 0.0–2.0)
EOS%: 3.3 % (ref 0.0–7.0)
HCT: 29.6 % — ABNORMAL LOW (ref 34.8–46.6)
HGB: 9.6 g/dL — ABNORMAL LOW (ref 11.6–15.9)
LYMPH%: 29.4 % (ref 14.0–48.0)
MCHC: 32.4 g/dL (ref 32.0–36.0)
MCV: 89 fL (ref 81–101)
MONO#: 0.8 10*3/uL (ref 0.1–0.9)
NEUT%: 55.9 % (ref 39.6–80.0)
RDW: 16.8 % — ABNORMAL HIGH (ref 11.1–15.7)

## 2012-09-17 LAB — CHCC SATELLITE - SMEAR

## 2012-09-17 LAB — IRON AND TIBC
%SAT: 22 % (ref 20–55)
TIBC: 205 ug/dL — ABNORMAL LOW (ref 250–470)

## 2012-09-17 LAB — FERRITIN: Ferritin: 866 ng/mL — ABNORMAL HIGH (ref 10–291)

## 2012-09-17 NOTE — Patient Instructions (Signed)
Please review the following...Marland KitchenMarland KitchenMarland Kitchen  Darbepoetin Alfa injection  What is this medicine?  DARBEPOETIN ALFA (dar be POE e tin AL fa) helps your body make more red blood cells. It is used to treat anemia caused by chronic kidney failure and chemotherapy. This medicine may be used for other purposes; ask your health care provider or pharmacist if you have questions.  What should I tell my health care provider before I take this medicine?  They need to know if you have any of these conditions: -blood clotting disorders or history of blood clots -cancer patient not on chemotherapy -cystic fibrosis -heart disease, such as angina, heart failure, or a history of a heart attack -hemoglobin level of 12 g/dL or greater -high blood pressure -low levels of folate, iron, or vitamin B12 -seizures -an unusual or allergic reaction to darbepoetin, erythropoietin, albumin, hamster proteins, latex, other medicines, foods, dyes, or preservatives -pregnant or trying to get pregnant -breast-feeding  How should I use this medicine?  This medicine is for injection into a vein or under the skin. It is usually given by a health care professional in a hospital or clinic setting. If you get this medicine at home, you will be taught how to prepare and give this medicine. Do not shake the solution before you withdraw a dose. Use exactly as directed. Take your medicine at regular intervals. Do not take your medicine more often than directed. It is important that you put your used needles and syringes in a special sharps container. Do not put them in a trash can. If you do not have a sharps container, call your pharmacist or healthcare provider to get one. Talk to your pediatrician regarding the use of this medicine in children. While this medicine may be used in children as young as 1 year for selected conditions, precautions do apply. Overdosage: If you think you have taken too much of this medicine contact a poison  control center or emergency room at once. NOTE: This medicine is only for you. Do not share this medicine with others.  What if I miss a dose?  If you miss a dose, take it as soon as you can. If it is almost time for your next dose, take only that dose. Do not take double or extra doses.  What may interact with this medicine?  Do not take this medicine with any of the following medications: -epoetin alfa This list may not describe all possible interactions. Give your health care provider a list of all the medicines, herbs, non-prescription drugs, or dietary supplements you use. Also tell them if you smoke, drink alcohol, or use illegal drugs. Some items may interact with your medicine.  What should I watch for while using this medicine?  Visit your prescriber or health care professional for regular checks on your progress and for the needed blood tests and blood pressure measurements. It is especially important for the doctor to make sure your hemoglobin level is in the desired range, to limit the risk of potential side effects and to give you the best benefit. Keep all appointments for any recommended tests. Check your blood pressure as directed. Ask your doctor what your blood pressure should be and when you should contact him or her. As your body makes more red blood cells, you may need to take iron, folic acid, or vitamin B supplements. Ask your doctor or health care provider which products are right for you. If you have kidney disease continue dietary restrictions, even though  this medication can make you feel better. Talk with your doctor or health care professional about the foods you eat and the vitamins that you take.  What side effects may I notice from receiving this medicine?  Side effects that you should report to your doctor or health care professional as soon as possible: -allergic reactions like skin rash, itching or hives, swelling of the face, lips, or tongue -breathing  problems -changes in vision -chest pain -confusion, trouble speaking or understanding -feeling faint or lightheaded, falls -high blood pressure -muscle aches or pains -pain, swelling, warmth in the leg -rapid weight gain -severe headaches -sudden numbness or weakness of the face, arm or leg -trouble walking, dizziness, loss of balance or coordination -seizures (convulsions) -swelling of the ankles, feet, hands -unusually weak or tired Side effects that usually do not require medical attention (report to your doctor or health care professional if they continue or are bothersome): -diarrhea -fever, chills (flu-like symptoms) -headaches -nausea, vomiting -redness, stinging, or swelling at site where injected This list may not describe all possible side effects. Call your doctor for medical advice about side effects. You may report side effects to FDA at 1-800-FDA-1088.   NOTE: This sheet is a summary. It may not cover all possible information. If you have questions about this medicine, talk to your doctor, pharmacist, or health care provider.  2012, Elsevier/Gold Standard. (09/28/2008 10:23:57 AM)

## 2012-09-17 NOTE — Progress Notes (Signed)
This office note has been dictated.

## 2012-09-18 NOTE — Progress Notes (Signed)
CC:   [name], M.D.  DIAGNOSES: 1. Diffuse large cell non-Hodgkin lymphoma-clinical remission. 2. Iron-deficiency anemia. 3. Paroxysmal atrial fibrillation.  CURRENT THERAPY:  Observation.  INTERIM HISTORY:  Ms. Pleitez comes in for followup.  We did go ahead and give her some iron.  We checked her iron studies and they were found to be a little on the low side.  She last got iron back on October 28th.  When we saw her at that point in time, her ferritin was 189 with iron saturation of only 14%.  She did have stools that were heme positive.  As such, we are going to have to check this out.  She is on Pradaxa, so we are going to have to be cautious with this.  She is having no abdominal pain.  She is having no nausea or vomiting. She is having no fevers.  PHYSICAL EXAMINATION:  General:  This is a well-developed, well- nourished white female in no obvious distress.  Vital signs:  98.1, pulse 57, respiratory rate 18, blood pressure 127/50.  Weight is 179. Head and neck:  Normocephalic, atraumatic skull.  There are no ocular or oral lesions.  There are no palpable cervical or supraclavicular lymph nodes.  Lungs:  Clear bilaterally.  Cardiac:  Regular rate and rhythm with a normal S1 and S2.  There are no murmurs, rubs, or bruits. Abdomen:  Soft with good bowel sounds.  There is no palpable abdominal mass.  There is no fluid wave.  There is no palpable hepatosplenomegaly. Back:  No tenderness over the spine, ribs, or hips.  Extremities:  No clubbing, cyanosis, or edema.  Neurological:  No focal neurological deficits.  LABORATORY STUDIES:  White cell count is 6.9, hemoglobin 9.6, hematocrit 29.6, platelet count 336.  IMPRESSION:  Ms. Bitton is a 71 year old white female with history of a large cell lymphoma.  Again, she was treated with chemotherapy.  This has not been a problem for [place] as far as we could tell.  She is now 2 years out from her treatment.  This was  completed back in August 2011.  Again, we are going to have to see about GI seeing her.  Her stools are heme positive.  We have to make sure there is nothing going on that needs to be addressed from a GI point of view.  She is on Pradaxa.  She needs to be on the Pradaxa because of the paroxysmal atrial fibrillation.  I want to see her back in 3 weeks' time.  By then, we should have a good idea as to this potential GI bleeding.    ______________________________ Josph Macho, M.D. PRE/MEDQ  D:  09/17/2012  T:  09/18/2012  Job:  [id number]

## 2012-10-02 ENCOUNTER — Encounter (HOSPITAL_COMMUNITY): Payer: Self-pay | Admitting: *Deleted

## 2012-10-02 ENCOUNTER — Encounter (HOSPITAL_COMMUNITY): Payer: Self-pay | Admitting: Pharmacy Technician

## 2012-10-02 NOTE — Pre-Procedure Instructions (Signed)
Your procedure is scheduled on: Report to [facility] Admitting at: Call this number if you have problems morning of your procedure:[phone]  Follow all bowel prep instructions per your doctor's orders.  Do not eat or drink anything after midnight the night before your procedure. You may brush your teeth, rinse out your mouth, but no water, no food, no chewing gum, no mints, no candies, no chewing tobacco.     Take these medicines the morning of your procedure with A SIP OF WATER:Norvasc,Nexium,Lopressor and Spironalactone.   Stopping Paradax on Saturday 120-04-2012 per doctor's instructions.   Please make arrangements for a responsible person to drive you home after the procedure. You cannot go home by cab/taxi. We recommend you have someone with you at home the first 24 hours after your procedure. Driver for procedure is spouse Therapist, nutritional.  LEAVE ALL VALUABLES, JEWELRY, BILLFOLD AT HOME.  NO DENTURES, CONTACT LENSES ALLOWED IN THE ENDOSCOPY ROOM.   YOU MAY WEAR DEODORANT, PLEASE REMOVE ALL JEWELRY, WATCHES RINGS, BODY PIERCINGS AND LEAVE AT HOME.   WOMEN: NO MAKE-UP, LOTIONS PERFUMES

## 2012-10-02 NOTE — Progress Notes (Signed)
Requested last office note visit, EKG, CXR and any diagnostic study material

## 2012-10-06 ENCOUNTER — Encounter (HOSPITAL_COMMUNITY)
Admission: RE | Disposition: A | Discharge status: Home / Self Care | Payer: Self-pay | Source: Ambulatory Visit | Attending: Gastroenterology

## 2012-10-06 ENCOUNTER — Encounter (HOSPITAL_COMMUNITY): Payer: Self-pay | Admitting: *Deleted

## 2012-10-06 ENCOUNTER — Encounter (HOSPITAL_COMMUNITY): Payer: Self-pay | Admitting: Anesthesiology

## 2012-10-06 ENCOUNTER — Ambulatory Visit (HOSPITAL_COMMUNITY)
Admission: RE | Admit: 2012-10-06 | Discharge: 2012-10-06 | Disposition: A | Discharge status: Home / Self Care | Payer: Medicare Other | Source: Ambulatory Visit | Attending: Gastroenterology | Admitting: Gastroenterology

## 2012-10-06 ENCOUNTER — Ambulatory Visit (HOSPITAL_COMMUNITY): Payer: Medicare Other | Admitting: Anesthesiology

## 2012-10-06 DIAGNOSIS — E119 Type 2 diabetes mellitus without complications: Secondary | ICD-10-CM | POA: Insufficient documentation

## 2012-10-06 DIAGNOSIS — K644 Residual hemorrhoidal skin tags: Secondary | ICD-10-CM | POA: Insufficient documentation

## 2012-10-06 DIAGNOSIS — Z79899 Other long term (current) drug therapy: Secondary | ICD-10-CM | POA: Insufficient documentation

## 2012-10-06 DIAGNOSIS — D649 Anemia, unspecified: Secondary | ICD-10-CM | POA: Insufficient documentation

## 2012-10-06 DIAGNOSIS — R195 Other fecal abnormalities: Secondary | ICD-10-CM | POA: Insufficient documentation

## 2012-10-06 DIAGNOSIS — K633 Ulcer of intestine: Secondary | ICD-10-CM | POA: Insufficient documentation

## 2012-10-06 DIAGNOSIS — I10 Essential (primary) hypertension: Secondary | ICD-10-CM | POA: Insufficient documentation

## 2012-10-06 DIAGNOSIS — I2510 Atherosclerotic heart disease of native coronary artery without angina pectoris: Secondary | ICD-10-CM | POA: Insufficient documentation

## 2012-10-06 DIAGNOSIS — I252 Old myocardial infarction: Secondary | ICD-10-CM | POA: Insufficient documentation

## 2012-10-06 DIAGNOSIS — K5289 Other specified noninfective gastroenteritis and colitis: Secondary | ICD-10-CM | POA: Insufficient documentation

## 2012-10-06 HISTORY — PX: ESOPHAGOGASTRODUODENOSCOPY (EGD) WITH PROPOFOL: SHX5813

## 2012-10-06 HISTORY — DX: Unspecified osteoarthritis, unspecified site: M19.90

## 2012-10-06 HISTORY — PX: COLONOSCOPY WITH PROPOFOL: SHX5780

## 2012-10-06 HISTORY — DX: Essential (primary) hypertension: I10

## 2012-10-06 HISTORY — DX: Anxiety disorder, unspecified: F41.9

## 2012-10-06 SURGERY — ESOPHAGOGASTRODUODENOSCOPY (EGD) WITH PROPOFOL
Anesthesia: Monitor Anesthesia Care

## 2012-10-06 MED ORDER — SODIUM CHLORIDE 0.9 % IV SOLN: INTRAVENOUS | Status: DC

## 2012-10-06 MED ORDER — FENTANYL CITRATE 0.05 MG/ML IJ SOLN
INTRAMUSCULAR | Status: DC | PRN
  Administered 2012-10-06 (×2): 25 ug via INTRAVENOUS

## 2012-10-06 MED ORDER — DABIGATRAN ETEXILATE MESYLATE 75 MG PO CAPS: 75.0000 mg | ORAL_CAPSULE | Freq: Two times a day (BID) | ORAL | Status: DC

## 2012-10-06 MED ORDER — BUTAMBEN-TETRACAINE-BENZOCAINE 2-2-14 % EX AERO
INHALATION_SPRAY | CUTANEOUS | Status: DC | PRN
  Administered 2012-10-06: 2 via TOPICAL

## 2012-10-06 MED ORDER — PROPOFOL 10 MG/ML IV BOLUS
INTRAVENOUS | Status: DC | PRN
  Administered 2012-10-06 (×3): 20 mg via INTRAVENOUS

## 2012-10-06 MED ORDER — FENTANYL CITRATE 0.05 MG/ML IJ SOLN: 25.0000 ug | INTRAMUSCULAR | Status: DC | PRN

## 2012-10-06 MED ORDER — PROPOFOL 10 MG/ML IV EMUL
INTRAVENOUS | Status: DC | PRN
  Administered 2012-10-06: 75 ug/kg/min via INTRAVENOUS

## 2012-10-06 MED ORDER — LACTATED RINGERS IV SOLN
INTRAVENOUS | Status: DC
  Administered 2012-10-06: 08:00:00 via INTRAVENOUS

## 2012-10-06 SURGICAL SUPPLY — 25 items

## 2012-10-06 NOTE — Op Note (Signed)
[facility] [address] [place], [address]   COLONOSCOPY PROCEDURE REPORT  PATIENT: [name], [name]  MR#: [id number] BIRTHDATE: 1940/11/08 , 71  yrs. old GENDER: Female ENDOSCOPIST: Willis Modena, MD REFERRED XB:JYNWG Kevan Ny, M.D.  Arlan Organ, M.D. PROCEDURE DATE:  10/06/2012 PROCEDURE:   Colonoscopy with biopsy ASA CLASS:   Class III INDICATIONS:anemia, hemoccult-positive stool. MEDICATIONS: MAC sedation, administered by CRNA  DESCRIPTION OF PROCEDURE:   After the risks benefits and alternatives of the procedure were thoroughly explained, informed consent was obtained.  A digital rectal exam revealed external hemorrhoids.   The Pentax Ped Colon [id number]  endoscope was introduced through the anus and advanced to the terminal ileum which was intubated for a short distance. No adverse events experienced.   The quality of the prep was good.  The instrument was then slowly withdrawn as the colon was fully examined.     Findings:  Large external hemorrhoids, otherwise normal digital rectal exam.  Prep quality was good.  Retroflexed views of rectum were normal.  There was some edema and mild ulceration at the ileocecal valve orifice; this region was a bit friable but soft and not typical of pre-cancerous changes or malignancy; biopsies with cold forceps obtained.  The distal 10cm of the terminal ileum was otherwise normal.  The remainder of the colon was normal without polyps, masses, vascular ectasias, or other inflammatory changes.       Withdrawal time was  about 10 minutes.     .  The scope was withdrawn and the procedure completed.  ENDOSCOPIC IMPRESSION:     Benign- and likely NSAID-related ulceration of the ileocecal valve, biopsies.  Hemorrhoids, likely source of hemoccult-positive stool.  Otherwise normal colonoscopy.  RECOMMENDATIONS:     1.  Watch for potential complications of procedure. 2.  Await biopsy results. 3.  Resume Pradaxa  tomorrow. 4.  Follow-up with Dr. Myna Hidalgo. 5.  Follow-up with [facility], as-needed, based on biopsy results. 6.  Consider repeat colonoscopy in 5-10 years, pending patient's state of health at that time.  eSigned:  Willis Modena, MD 10/06/2012 9:07 AM   cc:

## 2012-10-06 NOTE — Op Note (Signed)
[facility] [address] [place], [address]   ENDOSCOPY PROCEDURE REPORT  PATIENT: [name], [name]  MR#: [id number] BIRTHDATE: 07-Apr-1941 , 71  yrs. old GENDER: Female ENDOSCOPIST: Willis Modena, MD REFERRED BY:  Shaune Pollack, M.D.  Arlan Organ, M.D. PROCEDURE DATE:  10/06/2012 PROCEDURE:  EGD w/ biopsy ASA CLASS:     Class III INDICATIONS:  anemia, hemoccult-positive stool. MEDICATIONS: MAC sedation, administered by CRNA TOPICAL ANESTHETIC: Cetacaine Spray  DESCRIPTION OF PROCEDURE: After the risks benefits and alternatives of the procedure were thoroughly explained, informed consent was obtained.  The diagnotic upper      endoscope was introduced through the mouth and advanced to the second portion of the duodenum. Without limitations.  The instrument was slowly withdrawn as the mucosa was fully examined.     Findings:  Normal esophagus, stomach, pylorus, and duodenum to the second portion.  Random biopsies of normal-appearing duodenum were taken with cold forceps to evaluate for celiac sprue. Retroflexed views into cardia were normal.          The scope was then withdrawn from the patient and the procedure completed.  ENDOSCOPIC IMPRESSION:     Normal endoscopy to second portion of duodenum; no source of anemia identified.  RECOMMENDATIONS:     1.  Watch for potential complications of procedure. 2.  Await biopsy results. 3.  Proceed with colonoscopy today.  eSigned:  Willis Modena, MD 10/06/2012 9:01 AM   CC:

## 2012-10-06 NOTE — Transfer of Care (Signed)
Immediate Anesthesia Transfer of Care Note  Patient: [name]  Procedure(s) Performed: Procedure(s) (LRB): ESOPHAGOGASTRODUODENOSCOPY (EGD) WITH PROPOFOL (N/A) COLONOSCOPY WITH PROPOFOL (N/A)  Patient Location: PACU  Anesthesia Type: MAC  Level of Consciousness: sedated, patient cooperative and responds to stimulaton  Airway & Oxygen Therapy: Patient Spontanous Breathing and Patient connected to face mask oxgen  Post-op Assessment: Report given to PACU RN and Post -op Vital signs reviewed and stable  Post vital signs: Reviewed and stable  Complications: No apparent anesthesia complications ]

## 2012-10-06 NOTE — H&P (Signed)
Patient interval history reviewed.  Patient examined again.  There has been no change from documented H/P dated 10/01/12 (scanned into chart from our office) except as documented above.  Assessment:  1.  Anemia. 2.  Hemoccult-positive stool.  Plan:  1.  Endoscopy. 2.  Risks (bleeding, infection, bowel perforation that could require surgery, sedation-related changes in cardiopulmonary systems), benefits (identification and possible treatment of source of symptoms, exclusion of certain causes of symptoms), and alternatives (watchful waiting, radiographic imaging studies, empiric medical treatment) of upper endoscopy (EGD) were explained to patient/husband in detail and patient wishes to proceed. 3.  Colonoscopy. 4.  Risks (bleeding, infection, bowel perforation that could require surgery, sedation-related changes in cardiopulmonary systems), benefits (identification and possible treatment of source of symptoms, exclusion of certain causes of symptoms), and alternatives (watchful waiting, radiographic imaging studies, empiric medical treatment) of colonoscopy were explained to patient/husband in detail and patient wishes to proceed.

## 2012-10-06 NOTE — Anesthesia Preprocedure Evaluation (Signed)
Anesthesia Evaluation  Patient identified by MRN, date of birth, ID band Patient awake    Reviewed: Allergy & Precautions, H&P , NPO status , Patient's Chart, lab work & pertinent test results, reviewed documented beta blocker date and time   Airway Mallampati: III TM Distance: >3 FB Neck ROM: full    Dental No notable dental hx.    Pulmonary neg pulmonary ROS,  breath sounds clear to auscultation  Pulmonary exam normal       Cardiovascular hypertension, Pt. on medications and Pt. on home beta blockers + angina with exertion + CAD and + Past MI Rhythm:regular Rate:Normal  MI 2011   Neuro/Psych negative neurological ROS  negative psych ROS   GI/Hepatic negative GI ROS, Neg liver ROS,   Endo/Other  diabetes, Well Controlled, Type 2, Oral Hypoglycemic Agents  Renal/GU negative Renal ROS  negative genitourinary   Musculoskeletal   Abdominal   Peds  Hematology negative hematology ROS (+) anemia , Non-Hodgkins lymphoma   Anesthesia Other Findings   Reproductive/Obstetrics negative OB ROS                           Anesthesia Physical Anesthesia Plan  ASA: III  Anesthesia Plan: MAC   Post-op Pain Management:    Induction:   Airway Management Planned:   Additional Equipment:   Intra-op Plan:   Post-operative Plan:   Informed Consent: I have reviewed the patients History and Physical, chart, labs and discussed the procedure including the risks, benefits and alternatives for the proposed anesthesia with the patient or authorized representative who has indicated his/her understanding and acceptance.   Dental Advisory Given  Plan Discussed with: CRNA and Surgeon  Anesthesia Plan Comments:         Anesthesia Quick Evaluation

## 2012-10-06 NOTE — Anesthesia Postprocedure Evaluation (Signed)
  Anesthesia Post-op Note  Patient: [name]  Procedure(s) Performed: Procedure(s) (LRB): ESOPHAGOGASTRODUODENOSCOPY (EGD) WITH PROPOFOL (N/A) COLONOSCOPY WITH PROPOFOL (N/A)  Patient Location: PACU  Anesthesia Type: MAC  Level of Consciousness: awake and alert   Airway and Oxygen Therapy: Patient Spontanous Breathing  Post-op Pain: mild  Post-op Assessment: Post-op Vital signs reviewed, Patient's Cardiovascular Status Stable, Respiratory Function Stable, Patent Airway and No signs of Nausea or vomiting  Last Vitals:  Filed Vitals:   10/06/12 0940  BP: 175/89  Pulse:   Temp:   Resp: 16    Post-op Vital Signs: stable   Complications: No apparent anesthesia complications

## 2012-10-07 ENCOUNTER — Encounter (HOSPITAL_COMMUNITY): Payer: Self-pay | Admitting: Gastroenterology

## 2012-10-08 ENCOUNTER — Encounter: Payer: Self-pay | Admitting: Hematology & Oncology

## 2012-10-08 ENCOUNTER — Ambulatory Visit (HOSPITAL_BASED_OUTPATIENT_CLINIC_OR_DEPARTMENT_OTHER): Payer: Medicare Other | Admitting: Medical

## 2012-10-08 ENCOUNTER — Other Ambulatory Visit (HOSPITAL_BASED_OUTPATIENT_CLINIC_OR_DEPARTMENT_OTHER): Payer: Medicare Other | Admitting: Lab

## 2012-10-08 ENCOUNTER — Ambulatory Visit (HOSPITAL_BASED_OUTPATIENT_CLINIC_OR_DEPARTMENT_OTHER): Payer: Medicare Other

## 2012-10-08 VITALS — BP 115/49 | HR 53 | Temp 97.7°F | Resp 18 | Wt 176.0 lb

## 2012-10-08 DIAGNOSIS — D631 Anemia in chronic kidney disease: Secondary | ICD-10-CM

## 2012-10-08 DIAGNOSIS — D509 Iron deficiency anemia, unspecified: Secondary | ICD-10-CM

## 2012-10-08 DIAGNOSIS — C8330 Diffuse large B-cell lymphoma, unspecified site: Secondary | ICD-10-CM

## 2012-10-08 DIAGNOSIS — N039 Chronic nephritic syndrome with unspecified morphologic changes: Secondary | ICD-10-CM

## 2012-10-08 DIAGNOSIS — I4891 Unspecified atrial fibrillation: Secondary | ICD-10-CM

## 2012-10-08 DIAGNOSIS — C8589 Other specified types of non-Hodgkin lymphoma, extranodal and solid organ sites: Secondary | ICD-10-CM

## 2012-10-08 DIAGNOSIS — N189 Chronic kidney disease, unspecified: Secondary | ICD-10-CM

## 2012-10-08 LAB — IRON AND TIBC
Iron: 33 ug/dL — ABNORMAL LOW (ref 42–145)
TIBC: 203 ug/dL — ABNORMAL LOW (ref 250–470)
UIBC: 170 ug/dL (ref 125–400)

## 2012-10-08 LAB — CBC WITH DIFFERENTIAL (CANCER CENTER ONLY)
EOS%: 3.8 % (ref 0.0–7.0)
Eosinophils Absolute: 0.2 10*3/uL (ref 0.0–0.5)
HGB: 9 g/dL — ABNORMAL LOW (ref 11.6–15.9)
LYMPH#: 1.6 10*3/uL (ref 0.9–3.3)
MCH: 28 pg (ref 26.0–34.0)
MCHC: 31.6 g/dL — ABNORMAL LOW (ref 32.0–36.0)
MONO%: 10.6 % (ref 0.0–13.0)
NEUT#: 3.4 10*3/uL (ref 1.5–6.5)
Platelets: 328 10*3/uL (ref 145–400)
RBC: 3.21 10*6/uL — ABNORMAL LOW (ref 3.70–5.32)

## 2012-10-08 MED ORDER — DARBEPOETIN ALFA-POLYSORBATE 300 MCG/0.6ML IJ SOLN
300.0000 ug | Freq: Once | INTRAMUSCULAR | Status: AC
  Administered 2012-10-08: 300 ug via SUBCUTANEOUS

## 2012-10-08 NOTE — Patient Instructions (Signed)
Darbepoetin Alfa injection What is this medicine? DARBEPOETIN ALFA (dar be POE e tin AL fa) helps your body make more red blood cells. It is used to treat anemia caused by chronic kidney failure and chemotherapy. This medicine may be used for other purposes; ask your health care provider or pharmacist if you have questions. What should I tell my health care provider before I take this medicine? They need to know if you have any of these conditions: -blood clotting disorders or history of blood clots -cancer patient not on chemotherapy -cystic fibrosis -heart disease, such as angina, heart failure, or a history of a heart attack -hemoglobin level of 12 g/dL or greater -high blood pressure -low levels of folate, iron, or vitamin B12 -seizures -an unusual or allergic reaction to darbepoetin, erythropoietin, albumin, hamster proteins, latex, other medicines, foods, dyes, or preservatives -pregnant or trying to get pregnant -breast-feeding How should I use this medicine? This medicine is for injection into a vein or under the skin. It is usually given by a health care professional in a hospital or clinic setting. If you get this medicine at home, you will be taught how to prepare and give this medicine. Do not shake the solution before you withdraw a dose. Use exactly as directed. Take your medicine at regular intervals. Do not take your medicine more often than directed. It is important that you put your used needles and syringes in a special sharps container. Do not put them in a trash can. If you do not have a sharps container, call your pharmacist or healthcare provider to get one. Talk to your pediatrician regarding the use of this medicine in children. While this medicine may be used in children as young as 1 year for selected conditions, precautions do apply. Overdosage: If you think you have taken too much of this medicine contact a poison control center or emergency room at once. NOTE:  This medicine is only for you. Do not share this medicine with others. What if I miss a dose? If you miss a dose, take it as soon as you can. If it is almost time for your next dose, take only that dose. Do not take double or extra doses. What may interact with this medicine? Do not take this medicine with any of the following medications: -epoetin alfa This list may not describe all possible interactions. Give your health care provider a list of all the medicines, herbs, non-prescription drugs, or dietary supplements you use. Also tell them if you smoke, drink alcohol, or use illegal drugs. Some items may interact with your medicine. What should I watch for while using this medicine? Visit your prescriber or health care professional for regular checks on your progress and for the needed blood tests and blood pressure measurements. It is especially important for the doctor to make sure your hemoglobin level is in the desired range, to limit the risk of potential side effects and to give you the best benefit. Keep all appointments for any recommended tests. Check your blood pressure as directed. Ask your doctor what your blood pressure should be and when you should contact him or her. As your body makes more red blood cells, you may need to take iron, folic acid, or vitamin B supplements. Ask your doctor or health care provider which products are right for you. If you have kidney disease continue dietary restrictions, even though this medication can make you feel better. Talk with your doctor or health care professional about the   foods you eat and the vitamins that you take. What side effects may I notice from receiving this medicine? Side effects that you should report to your doctor or health care professional as soon as possible: -allergic reactions like skin rash, itching or hives, swelling of the face, lips, or tongue -breathing problems -changes in vision -chest pain -confusion, trouble speaking  or understanding -feeling faint or lightheaded, falls -high blood pressure -muscle aches or pains -pain, swelling, warmth in the leg -rapid weight gain -severe headaches -sudden numbness or weakness of the face, arm or leg -trouble walking, dizziness, loss of balance or coordination -seizures (convulsions) -swelling of the ankles, feet, hands -unusually weak or tired Side effects that usually do not require medical attention (report to your doctor or health care professional if they continue or are bothersome): -diarrhea -fever, chills (flu-like symptoms) -headaches -nausea, vomiting -redness, stinging, or swelling at site where injected This list may not describe all possible side effects. Call your doctor for medical advice about side effects. You may report side effects to FDA at 1-800-FDA-1088. Where should I keep my medicine? Keep out of the reach of children. Store in a refrigerator between 2 and 8 degrees C (36 and 46 degrees F). Do not freeze. Do not shake. Throw away any unused portion if using a single-dose vial. Throw away any unused medicine after the expiration date. NOTE: This sheet is a summary. It may not cover all possible information. If you have questions about this medicine, talk to your doctor, pharmacist, or health care provider.  2012, [employer]. (09/28/2008 10:23:57 AM) 

## 2012-10-08 NOTE — Progress Notes (Signed)
Diagnoses: #1 Diffuse large cell non-Hodgkin lymphoma-clinical remission. #2 iron deficiency anemia. #3.  Anemia, secondary to renal insufficiency. #4 proximal atrial fibrillation.  Current therapy: #1.  Observation. #2.  IV iron as indicated.  Last dose of IV iron was on 08/25/2012. #3.  Aranesp 300 mcg subcutaneous every 4 weeks.  For hemoglobin less than 11. #4.  Pradaxa.  Interim history: [name] presents today for an office followup visit.  She recently just had an endoscopy and colonoscopy, done back on the be some her 07/18/2012.  This was warranted secondary to Hemoccult positive stool.  Her endoscopy and colonoscopy were essentially normal except for some inflammation, most likely related to NSAID use.  She does followup with her gastroenterologist at [facility].  She remains on Pradaxa for her atrial fibrillation.  The last, time, she received IV iron was back on October 28.  At that time, her ferritin was 189, with iron saturation of only 14%.  Her hemoglobin today is 9.0.  Her MCV is 88.8.  We are still monitoring her renal function.  Back in April.  Her creatinine was 1.88.  Back in October, was 1.43.  We felt she may have some anemia of renal insufficiency.  We did go ahead and did her set up to receive Aranesp 300 mcg subcutaneous once a month starting today.  She otherwise reports, that she's doing well.  She has a good appetite.  She denies any nausea, vomiting, diarrhea, constipation, any chest pain, shortness of breath, or cough.  She denies any fevers, chills, or night sweats.  She denies any palpable adenopathy.  She denies any unintentional weight loss or weight gain.  She denies any obvious, or abnormal bleeding.  She denies any lower leg swelling.  She denies any headaches, visual changes, or rashes.    Review of Systems: Constitutional:Negative for malaise/fatigue, fever, chills, weight loss, diaphoresis, activity change, appetite change, and unexpected weight change.   HEENT: Negative for double vision, blurred vision, visual loss, ear pain, tinnitus, congestion, rhinorrhea, epistaxis sore throat or sinus disease, oral pain/lesion, tongue soreness Respiratory: Negative for cough, chest tightness, shortness of breath, wheezing and stridor.  Cardiovascular: Negative for chest pain, palpitations, leg swelling, orthopnea, PND, DOE or claudication Gastrointestinal: Negative for nausea, vomiting, abdominal pain, diarrhea, constipation, blood in stool, melena, hematochezia, abdominal distention, anal bleeding, rectal pain, anorexia and hematemesis.  Genitourinary: Negative for dysuria, frequency, hematuria,  Musculoskeletal: Negative for myalgias, back pain, joint swelling, arthralgias and gait problem.  Skin: Negative for rash, color change, pallor and wound.  Neurological:. Negative for dizziness/light-headedness, tremors, seizures, syncope, facial asymmetry, speech difficulty, weakness, numbness, headaches and paresthesias.  Hematological: Negative for adenopathy. Does not bruise/bleed easily.  Psychiatric/Behavioral:  Negative for depression, no loss of interest in normal activity or change in sleep pattern.   Physical Exam: .  [name] is a pleasant, 71 year old, well-developed, well-nourished, white female, in no obvious distress Vitals: Temperature 97.7 degrees, pulse 53, respirations 18, blood pressure 115/49, weight 176 pounds HEENT reveals a normocephalic, atraumatic skull, no scleral icterus, no oral lesions  Neck is supple without any cervical or supraclavicular adenopathy.  Lungs are clear to auscultation bilaterally. There are no wheezes, rales or rhonci Cardiac is regular rate and rhythm with a normal S1 and S2. There are no murmurs, rubs, or bruits.  Abdomen is soft with good bowel sounds, there is no palpable mass. There is no palpable hepatosplenomegaly. There is no palpable fluid wave.  Musculoskeletal no tenderness of the spine,  ribs, or hips.   Extremities there are no clubbing, cyanosis, or edema.  Skin no petechia, purpura or ecchymosis Neurologic is nonfocal.  Laboratory Data: White count 5.8, hemoglobin 9.0, hematocrit 28.5.  Platelets 328,000, MCV 88.8  Current Outpatient Prescriptions on File Prior to Visit  Medication Sig Dispense Refill  . amLODipine (NORVASC) 5 MG tablet Take 7.5 mg by mouth every morning.      Marland Kitchen aspirin EC 81 MG tablet Take 81 mg by mouth every morning.       . Calcium 500 MG tablet Take 600 mg by mouth daily.      . cetirizine (ZYRTEC) 10 MG tablet Take 10 mg by mouth daily.      . cholecalciferol (VITAMIN D) 1000 UNITS tablet Take 2,000 Units by mouth daily.        . dabigatran (PRADAXA) 75 MG CAPS Take 1 capsule (75 mg total) by mouth 2 (two) times daily.  60 capsule    . Dextromethorphan HBr (CREOMULSION ADULT) 20 MG/15ML SYRP Take 30 mLs by mouth 3 (three) times daily as needed. For cough and congestion      . esomeprazole (NEXIUM) 40 MG capsule Take 40 mg by mouth daily.        . fish oil-omega-3 fatty acids 1000 MG capsule Take 1 g by mouth daily.      . furosemide (LASIX) 40 MG tablet Take 40 mg by mouth every morning.       Marland Kitchen glimepiride (AMARYL) 4 MG tablet Take 4 mg by mouth daily.        Marland Kitchen lisinopril (PRINIVIL,ZESTRIL) 20 MG tablet Take 20 mg by mouth every morning.        . metoprolol (LOPRESSOR) 50 MG tablet Take 50 mg by mouth 2 (two) times daily.      . Multiple Vitamin (MULTIVITAMIN WITH MINERALS) TABS Take 1 tablet by mouth daily.      . rosuvastatin (CRESTOR) 5 MG tablet Take 10 mg by mouth at bedtime.       Marland Kitchen spironolactone (ALDACTONE) 25 MG tablet Take 25 mg by mouth daily.      . traMADol (ULTRAM) 50 MG tablet Take 50 mg by mouth every 6 (six) hours as needed. For pain. Maximum dose= 8 tablets per day       . vitamin C (ASCORBIC ACID) 500 MG tablet Take 500 mg by mouth daily.         Assessment/Plan: this is a pleasant, 71 year old, white female, with the following  issues:  #1.  History of large cell lymphoma.  She was treated with chemotherapy.  She is now 2 years out from her treatment.  This was completed back in August 2011.  She currently is in clinical remission.  #2.  Intermittent iron deficiency.  We are checking, her iron panel today.  If, in fact she needs IV iron, we will give her a call.  #3.  Anemia of renal insufficiency.  We will go ahead and give her Aranesp 300 mcg today.  We give her Aranesp for hemoglobin less than 11.  She will come in for lab and possible Aranesp injection every 4 weeks.  #4.  Atrial fibrillation.  She remains on Pradaxa.   #5.  Followup.  We will follow back up with [name] in 3 months, but before then should there be questions or concerns.

## 2012-10-09 ENCOUNTER — Encounter: Payer: Self-pay | Admitting: Hematology & Oncology

## 2012-10-29 DIAGNOSIS — N186 End stage renal disease: Secondary | ICD-10-CM

## 2012-10-29 HISTORY — DX: Dependence on renal dialysis: N18.6

## 2012-11-05 ENCOUNTER — Ambulatory Visit (HOSPITAL_BASED_OUTPATIENT_CLINIC_OR_DEPARTMENT_OTHER): Payer: Medicare Other

## 2012-11-05 ENCOUNTER — Other Ambulatory Visit (HOSPITAL_BASED_OUTPATIENT_CLINIC_OR_DEPARTMENT_OTHER): Payer: Medicare Other | Admitting: Lab

## 2012-11-05 VITALS — BP 135/62 | HR 62 | Temp 97.7°F | Resp 18

## 2012-11-05 DIAGNOSIS — D509 Iron deficiency anemia, unspecified: Secondary | ICD-10-CM

## 2012-11-05 DIAGNOSIS — D631 Anemia in chronic kidney disease: Secondary | ICD-10-CM

## 2012-11-05 DIAGNOSIS — C8330 Diffuse large B-cell lymphoma, unspecified site: Secondary | ICD-10-CM

## 2012-11-05 DIAGNOSIS — N039 Chronic nephritic syndrome with unspecified morphologic changes: Secondary | ICD-10-CM

## 2012-11-05 DIAGNOSIS — N189 Chronic kidney disease, unspecified: Secondary | ICD-10-CM

## 2012-11-05 LAB — CBC WITH DIFFERENTIAL (CANCER CENTER ONLY)
BASO#: 0 10*3/uL (ref 0.0–0.2)
Eosinophils Absolute: 0.2 10*3/uL (ref 0.0–0.5)
HCT: 28.2 % — ABNORMAL LOW (ref 34.8–46.6)
LYMPH%: 23.2 % (ref 14.0–48.0)
MCH: 26.9 pg (ref 26.0–34.0)
MCV: 87 fL (ref 81–101)
MONO#: 0.6 10*3/uL (ref 0.1–0.9)
NEUT%: 63.5 % (ref 39.6–80.0)
RBC: 3.23 10*6/uL — ABNORMAL LOW (ref 3.70–5.32)
WBC: 6 10*3/uL (ref 3.9–10.0)

## 2012-11-05 MED ORDER — DARBEPOETIN ALFA-POLYSORBATE 300 MCG/0.6ML IJ SOLN
300.0000 ug | Freq: Once | INTRAMUSCULAR | Status: AC
  Administered 2012-11-05: 300 ug via SUBCUTANEOUS

## 2012-11-05 NOTE — Patient Instructions (Signed)
Darbepoetin Alfa injection What is this medicine? DARBEPOETIN ALFA (dar be POE e tin AL fa) helps your body make more red blood cells. It is used to treat anemia caused by chronic kidney failure and chemotherapy. This medicine may be used for other purposes; ask your health care provider or pharmacist if you have questions. What should I tell my health care provider before I take this medicine? They need to know if you have any of these conditions: -blood clotting disorders or history of blood clots -cancer patient not on chemotherapy -cystic fibrosis -heart disease, such as angina, heart failure, or a history of a heart attack -hemoglobin level of 12 g/dL or greater -high blood pressure -low levels of folate, iron, or vitamin B12 -seizures -an unusual or allergic reaction to darbepoetin, erythropoietin, albumin, hamster proteins, latex, other medicines, foods, dyes, or preservatives -pregnant or trying to get pregnant -breast-feeding How should I use this medicine? This medicine is for injection into a vein or under the skin. It is usually given by a health care professional in a hospital or clinic setting. If you get this medicine at home, you will be taught how to prepare and give this medicine. Do not shake the solution before you withdraw a dose. Use exactly as directed. Take your medicine at regular intervals. Do not take your medicine more often than directed. It is important that you put your used needles and syringes in a special sharps container. Do not put them in a trash can. If you do not have a sharps container, call your pharmacist or healthcare provider to get one. Talk to your pediatrician regarding the use of this medicine in children. While this medicine may be used in children as young as 1 year for selected conditions, precautions do apply. Overdosage: If you think you have taken too much of this medicine contact a poison control center or emergency room at once. NOTE:  This medicine is only for you. Do not share this medicine with others. What if I miss a dose? If you miss a dose, take it as soon as you can. If it is almost time for your next dose, take only that dose. Do not take double or extra doses. What may interact with this medicine? Do not take this medicine with any of the following medications: -epoetin alfa This list may not describe all possible interactions. Give your health care provider a list of all the medicines, herbs, non-prescription drugs, or dietary supplements you use. Also tell them if you smoke, drink alcohol, or use illegal drugs. Some items may interact with your medicine. What should I watch for while using this medicine? Visit your prescriber or health care professional for regular checks on your progress and for the needed blood tests and blood pressure measurements. It is especially important for the doctor to make sure your hemoglobin level is in the desired range, to limit the risk of potential side effects and to give you the best benefit. Keep all appointments for any recommended tests. Check your blood pressure as directed. Ask your doctor what your blood pressure should be and when you should contact him or her. As your body makes more red blood cells, you may need to take iron, folic acid, or vitamin B supplements. Ask your doctor or health care provider which products are right for you. If you have kidney disease continue dietary restrictions, even though this medication can make you feel better. Talk with your doctor or health care professional about the   foods you eat and the vitamins that you take. What side effects may I notice from receiving this medicine? Side effects that you should report to your doctor or health care professional as soon as possible: -allergic reactions like skin rash, itching or hives, swelling of the face, lips, or tongue -breathing problems -changes in vision -chest pain -confusion, trouble speaking  or understanding -feeling faint or lightheaded, falls -high blood pressure -muscle aches or pains -pain, swelling, warmth in the leg -rapid weight gain -severe headaches -sudden numbness or weakness of the face, arm or leg -trouble walking, dizziness, loss of balance or coordination -seizures (convulsions) -swelling of the ankles, feet, hands -unusually weak or tired Side effects that usually do not require medical attention (report to your doctor or health care professional if they continue or are bothersome): -diarrhea -fever, chills (flu-like symptoms) -headaches -nausea, vomiting -redness, stinging, or swelling at site where injected This list may not describe all possible side effects. Call your doctor for medical advice about side effects. You may report side effects to FDA at 1-800-FDA-1088. Where should I keep my medicine? Keep out of the reach of children. Store in a refrigerator between 2 and 8 degrees C (36 and 46 degrees F). Do not freeze. Do not shake. Throw away any unused portion if using a single-dose vial. Throw away any unused medicine after the expiration date. NOTE: This sheet is a summary. It may not cover all possible information. If you have questions about this medicine, talk to your doctor, pharmacist, or health care provider.  2012, [employer]. (09/28/2008 10:23:57 AM) 

## 2012-11-06 LAB — COMPREHENSIVE METABOLIC PANEL
BUN: 23 mg/dL (ref 6–23)
CO2: 29 mEq/L (ref 19–32)
Creatinine, Ser: 1.38 mg/dL — ABNORMAL HIGH (ref 0.50–1.10)
Glucose, Bld: 246 mg/dL — ABNORMAL HIGH (ref 70–99)
Total Bilirubin: 0.3 mg/dL (ref 0.3–1.2)
Total Protein: 5.8 g/dL — ABNORMAL LOW (ref 6.0–8.3)

## 2012-11-06 LAB — RETICULOCYTES (CHCC): RBC.: 3.39 MIL/uL — ABNORMAL LOW (ref 3.87–5.11)

## 2012-11-06 LAB — IRON AND TIBC
Iron: 20 ug/dL — ABNORMAL LOW (ref 42–145)
TIBC: 187 ug/dL — ABNORMAL LOW (ref 250–470)

## 2012-11-06 LAB — LACTATE DEHYDROGENASE: LDH: 184 U/L (ref 94–250)

## 2012-11-06 LAB — ERYTHROPOIETIN: Erythropoietin: 182.4 m[IU]/mL — ABNORMAL HIGH (ref 2.6–18.5)

## 2012-11-19 ENCOUNTER — Telehealth: Payer: Self-pay | Admitting: Hematology & Oncology

## 2012-11-19 ENCOUNTER — Other Ambulatory Visit: Payer: Medicare Other | Admitting: Lab

## 2012-11-19 ENCOUNTER — Ambulatory Visit: Payer: Medicare Other

## 2012-11-19 ENCOUNTER — Ambulatory Visit: Payer: Medicare Other | Admitting: Hematology & Oncology

## 2012-11-19 NOTE — Telephone Encounter (Signed)
Pt aware of 1-23

## 2012-11-19 NOTE — Progress Notes (Signed)
Patient was no show today.

## 2012-11-20 ENCOUNTER — Ambulatory Visit (HOSPITAL_BASED_OUTPATIENT_CLINIC_OR_DEPARTMENT_OTHER): Payer: Medicare Other

## 2012-11-20 ENCOUNTER — Other Ambulatory Visit (HOSPITAL_BASED_OUTPATIENT_CLINIC_OR_DEPARTMENT_OTHER): Payer: Medicare Other | Admitting: Lab

## 2012-11-20 ENCOUNTER — Ambulatory Visit (HOSPITAL_BASED_OUTPATIENT_CLINIC_OR_DEPARTMENT_OTHER): Payer: Medicare Other | Admitting: Hematology & Oncology

## 2012-11-20 VITALS — BP 109/49 | HR 58 | Temp 98.1°F | Resp 16 | Ht 62.0 in | Wt 173.0 lb

## 2012-11-20 DIAGNOSIS — C8590 Non-Hodgkin lymphoma, unspecified, unspecified site: Secondary | ICD-10-CM

## 2012-11-20 DIAGNOSIS — N039 Chronic nephritic syndrome with unspecified morphologic changes: Secondary | ICD-10-CM

## 2012-11-20 DIAGNOSIS — D631 Anemia in chronic kidney disease: Secondary | ICD-10-CM

## 2012-11-20 DIAGNOSIS — C8589 Other specified types of non-Hodgkin lymphoma, extranodal and solid organ sites: Secondary | ICD-10-CM

## 2012-11-20 DIAGNOSIS — N189 Chronic kidney disease, unspecified: Secondary | ICD-10-CM

## 2012-11-20 DIAGNOSIS — D509 Iron deficiency anemia, unspecified: Secondary | ICD-10-CM

## 2012-11-20 LAB — CBC WITH DIFFERENTIAL (CANCER CENTER ONLY)
BASO#: 0 10*3/uL (ref 0.0–0.2)
Eosinophils Absolute: 0.3 10*3/uL (ref 0.0–0.5)
HCT: 27.4 % — ABNORMAL LOW (ref 34.8–46.6)
HGB: 8.4 g/dL — ABNORMAL LOW (ref 11.6–15.9)
LYMPH#: 1.9 10*3/uL (ref 0.9–3.3)
MCH: 26.2 pg (ref 26.0–34.0)
MONO%: 11.6 % (ref 0.0–13.0)
NEUT#: 3.4 10*3/uL (ref 1.5–6.5)
RBC: 3.21 10*6/uL — ABNORMAL LOW (ref 3.70–5.32)

## 2012-11-20 MED ORDER — SODIUM CHLORIDE 0.9 % IV SOLN
1020.0000 mg | Freq: Once | INTRAVENOUS | Status: AC
  Administered 2012-11-20: 1020 mg via INTRAVENOUS
  Filled 2012-11-20: qty 34

## 2012-11-20 MED ORDER — DARBEPOETIN ALFA-POLYSORBATE 300 MCG/0.6ML IJ SOLN
300.0000 ug | Freq: Once | INTRAMUSCULAR | Status: AC
  Administered 2012-11-20: 300 ug via SUBCUTANEOUS

## 2012-11-20 MED ORDER — SODIUM CHLORIDE 0.9 % IV SOLN
Freq: Once | INTRAVENOUS | Status: AC
  Administered 2012-11-20: 14:00:00 via INTRAVENOUS

## 2012-11-20 NOTE — Progress Notes (Signed)
This office note has been dictated.

## 2012-11-20 NOTE — Patient Instructions (Signed)
Darbepoetin Alfa injection What is this medicine? DARBEPOETIN ALFA (dar be POE e tin AL fa) helps your body make more red blood cells. It is used to treat anemia caused by chronic kidney failure and chemotherapy. This medicine may be used for other purposes; ask your health care provider or pharmacist if you have questions. What should I tell my health care provider before I take this medicine? They need to know if you have any of these conditions: -blood clotting disorders or history of blood clots -cancer patient not on chemotherapy -cystic fibrosis -heart disease, such as angina, heart failure, or a history of a heart attack -hemoglobin level of 12 g/dL or greater -high blood pressure -low levels of folate, iron, or vitamin B12 -seizures -an unusual or allergic reaction to darbepoetin, erythropoietin, albumin, hamster proteins, latex, other medicines, foods, dyes, or preservatives -pregnant or trying to get pregnant -breast-feeding How should I use this medicine? This medicine is for injection into a vein or under the skin. It is usually given by a health care professional in a hospital or clinic setting. If you get this medicine at home, you will be taught how to prepare and give this medicine. Do not shake the solution before you withdraw a dose. Use exactly as directed. Take your medicine at regular intervals. Do not take your medicine more often than directed. It is important that you put your used needles and syringes in a special sharps container. Do not put them in a trash can. If you do not have a sharps container, call your pharmacist or healthcare provider to get one. Talk to your pediatrician regarding the use of this medicine in children. While this medicine may be used in children as young as 1 year for selected conditions, precautions do apply. Overdosage: If you think you have taken too much of this medicine contact a poison control center or emergency room at once. NOTE:  This medicine is only for you. Do not share this medicine with others. What if I miss a dose? If you miss a dose, take it as soon as you can. If it is almost time for your next dose, take only that dose. Do not take double or extra doses. What may interact with this medicine? Do not take this medicine with any of the following medications: -epoetin alfa This list may not describe all possible interactions. Give your health care provider a list of all the medicines, herbs, non-prescription drugs, or dietary supplements you use. Also tell them if you smoke, drink alcohol, or use illegal drugs. Some items may interact with your medicine. What should I watch for while using this medicine? Visit your prescriber or health care professional for regular checks on your progress and for the needed blood tests and blood pressure measurements. It is especially important for the doctor to make sure your hemoglobin level is in the desired range, to limit the risk of potential side effects and to give you the best benefit. Keep all appointments for any recommended tests. Check your blood pressure as directed. Ask your doctor what your blood pressure should be and when you should contact him or her. As your body makes more red blood cells, you may need to take iron, folic acid, or vitamin B supplements. Ask your doctor or health care provider which products are right for you. If you have kidney disease continue dietary restrictions, even though this medication can make you feel better. Talk with your doctor or health care professional about the   foods you eat and the vitamins that you take. What side effects may I notice from receiving this medicine? Side effects that you should report to your doctor or health care professional as soon as possible: -allergic reactions like skin rash, itching or hives, swelling of the face, lips, or tongue -breathing problems -changes in vision -chest pain -confusion, trouble speaking  or understanding -feeling faint or lightheaded, falls -high blood pressure -muscle aches or pains -pain, swelling, warmth in the leg -rapid weight gain -severe headaches -sudden numbness or weakness of the face, arm or leg -trouble walking, dizziness, loss of balance or coordination -seizures (convulsions) -swelling of the ankles, feet, hands -unusually weak or tired Side effects that usually do not require medical attention (report to your doctor or health care professional if they continue or are bothersome): -diarrhea -fever, chills (flu-like symptoms) -headaches -nausea, vomiting -redness, stinging, or swelling at site where injected This list may not describe all possible side effects. Call your doctor for medical advice about side effects. You may report side effects to FDA at 1-800-FDA-1088. Where should I keep my medicine? Keep out of the reach of children. Store in a refrigerator between 2 and 8 degrees C (36 and 46 degrees F). Do not freeze. Do not shake. Throw away any unused portion if using a single-dose vial. Throw away any unused medicine after the expiration date. NOTE: This sheet is a summary. It may not cover all possible information. If you have questions about this medicine, talk to your doctor, pharmacist, or health care provider.  2012, Elsevier/Gold Standard. (09/28/2008 10:23:57 AM)Ferumoxytol injection What is this medicine? FERUMOXYTOL is an iron complex. Iron is used to make healthy red blood cells, which carry oxygen and nutrients throughout the body. This medicine is used to treat iron deficiency anemia in people with chronic kidney disease. This medicine may be used for other purposes; ask your health care provider or pharmacist if you have questions. What should I tell my health care provider before I take this medicine? They need to know if you have any of these conditions: -anemia not caused by low iron levels -high levels of iron in the blood -magnetic  resonance imaging (MRI) test scheduled -an unusual or allergic reaction to iron, other medicines, foods, dyes, or preservatives -pregnant or trying to get pregnant -breast-feeding How should I use this medicine? This medicine is for infusion into a vein. It is given by a health care professional in a hospital or clinic setting. Talk to your pediatrician regarding the use of this medicine in children. Special care may be needed. Overdosage: If you think you've taken too much of this medicine contact a poison control center or emergency room at once. Overdosage: If you think you have taken too much of this medicine contact a poison control center or emergency room at once. NOTE: This medicine is only for you. Do not share this medicine with others. What if I miss a dose? It is important not to miss your dose. Call your doctor or health care professional if you are unable to keep an appointment. What may interact with this medicine? This medicine may interact with the following medications: -other iron products This list may not describe all possible interactions. Give your health care provider a list of all the medicines, herbs, non-prescription drugs, or dietary supplements you use. Also tell them if you smoke, drink alcohol, or use illegal drugs. Some items may interact with your medicine. What should I watch for while using this   medicine? Visit your doctor or healthcare professional regularly. Tell your doctor or healthcare professional if your symptoms do not start to get better or if they get worse. You may need blood work done while you are taking this medicine. You may need to follow a special diet. Talk to your doctor. Foods that contain iron include: whole grains/cereals, dried fruits, beans, or peas, leafy green vegetables, and organ meats (liver, kidney). What side effects may I notice from receiving this medicine? Side effects that you should report to your doctor or health care  professional as soon as possible: -allergic reactions like skin rash, itching or hives, swelling of the face, lips, or tongue -breathing problems -changes in blood pressure -feeling faint or lightheaded, falls -fever or chills -flushing, sweating, or hot feelings -swelling of the ankles or feet Side effects that usually do not require medical attention (Report these to your doctor or health care professional if they continue or are bothersome.): -diarrhea -headache -nausea, vomiting -stomach pain This list may not describe all possible side effects. Call your doctor for medical advice about side effects. You may report side effects to FDA at 1-800-FDA-1088. Where should I keep my medicine? This drug is given in a hospital or clinic and will not be stored at home. NOTE: This sheet is a summary. It may not cover all possible information. If you have questions about this medicine, talk to your doctor, pharmacist, or health care provider.  2012, Elsevier/Gold Standard. (07/07/2008 9:48:25 PM) 

## 2012-11-21 NOTE — Progress Notes (Signed)
CC:   [name], M.D. [name], M.D.  DIAGNOSES: 1. Anemia, multifactorial. 2. History of diffuse large cell non-Hodgkin's lymphoma, clinical     remission. 3. Chronic atrial fibrillation on anticoagulation.  CURRENT THERAPY: 1. IV iron as indicated, patient received 1 dose today. 2. Aranesp 300 mcg subcu as indicated for hemoglobin less than 11.  INTERIM HISTORY:  [name] comes in for followup.  She still feels tired.  She feels worn out.  When we last saw her, her iron studies were a little bit on the low side.  As such, we are going to give her a dose of iron today.  Her ferritin was 394.  Iron saturation was only 11.  I do not see any evidence of recurrent lymphoma.  Her last PET scan was done over a year and a half ago.  Everything looked fine with no evidence of recurrent disease.  I do not think we need to repeat a PET scan as I do not find anything on her exam or history right now.  She has had no bleeding.  She has had no nausea or vomiting.  She just feels very tired.  She is on I think high dose blood pressure medications.  Her blood pressure has been on the low side.  Her heart rate is only 58.  I think she needs a dosage adjustment with her blood pressure medicines.  I told her to stop the Norvasc and Aldactone.  I told her to cut back the dose of Lasix to 20 mg a day.  I told her to decrease the dose of the lisinopril to 10 mg a day.  She is on Lopressor.  We may have to make an adjustment with this dosage.  She said her blood sugars are on the low side.  She is on Amaryl. Again, I told her to take cut this dose in half.  PHYSICAL EXAMINATION:  General:  This is a fairly well-developed, well- nourished white female in no obvious distress.  Vital signs:  Show temperature of 98.1, pulse 58, respiratory rate 16, blood pressure 109/49.  Weight is 173.  Head and neck:  Shows a normocephalic, atraumatic skull.  There are no ocular or oral  lesions.  There are no palpable cervical or supraclavicular lymph nodes.  Lungs:  Clear bilaterally.  There are no rales, wheeze or rhonchi.  Cardiac:  Regular rate and rhythm with an occasional extra beat.  She has no murmurs, rubs or bruits.  Abdomen:  Soft with good bowel sounds.  There is no palpable abdominal mass.  There is no palpable hepatosplenomegaly.  Extremities: Show no clubbing, cyanosis or edema.  Skin:  Exam is slightly dry.  She has no rash, ecchymosis or petechia.  LABORATORY STUDIES:  White cell count is 6.3, hemoglobin 8.4, hematocrit 27.4, platelet count 397.  MCV is 85.  IMPRESSION:  [name] is a 72 year old white female with history of diffuse large cell non-Hodgkin's lymphoma.  She is out 2-1/2 years out from her treatment.  Again, I do not see any obvious recurrence.  My main concern is her anemia.  We will give her iron today.  I have given another dose of Aranesp today.  I do want to get her back in about 3 weeks or so.  Hopefully, we will find that she is improving and that her blood count is improving.  If not, I think we are going to have to embark upon a re-evaluation for relapsed  lymphoma.    ______________________________ Josph Macho, M.D. PRE/MEDQ  D:  11/20/2012  T:  11/21/2012  Job:  [id number]

## 2012-12-03 ENCOUNTER — Ambulatory Visit: Payer: Medicare Other

## 2012-12-03 ENCOUNTER — Other Ambulatory Visit: Payer: Medicare Other | Admitting: Lab

## 2012-12-11 ENCOUNTER — Ambulatory Visit: Payer: Medicare Other

## 2012-12-11 ENCOUNTER — Ambulatory Visit: Payer: Medicare Other | Admitting: Medical

## 2012-12-11 ENCOUNTER — Other Ambulatory Visit: Payer: Medicare Other | Admitting: Lab

## 2012-12-13 ENCOUNTER — Other Ambulatory Visit: Payer: Self-pay

## 2012-12-16 ENCOUNTER — Other Ambulatory Visit: Payer: Self-pay | Admitting: Family Medicine

## 2012-12-16 DIAGNOSIS — R1031 Right lower quadrant pain: Secondary | ICD-10-CM

## 2012-12-18 ENCOUNTER — Other Ambulatory Visit: Payer: Self-pay | Admitting: Medical

## 2012-12-18 ENCOUNTER — Ambulatory Visit (HOSPITAL_BASED_OUTPATIENT_CLINIC_OR_DEPARTMENT_OTHER): Payer: Medicare Other | Admitting: Medical

## 2012-12-18 ENCOUNTER — Other Ambulatory Visit: Payer: Self-pay | Admitting: Hematology & Oncology

## 2012-12-18 ENCOUNTER — Ambulatory Visit (HOSPITAL_BASED_OUTPATIENT_CLINIC_OR_DEPARTMENT_OTHER): Payer: Medicare Other | Admitting: Lab

## 2012-12-18 ENCOUNTER — Ambulatory Visit: Payer: Medicare Other

## 2012-12-18 VITALS — BP 120/68 | HR 57 | Temp 97.9°F | Resp 16 | Ht 62.0 in | Wt 167.0 lb

## 2012-12-18 DIAGNOSIS — D631 Anemia in chronic kidney disease: Secondary | ICD-10-CM

## 2012-12-18 DIAGNOSIS — C8330 Diffuse large B-cell lymphoma, unspecified site: Secondary | ICD-10-CM

## 2012-12-18 DIAGNOSIS — I4891 Unspecified atrial fibrillation: Secondary | ICD-10-CM

## 2012-12-18 DIAGNOSIS — C8589 Other specified types of non-Hodgkin lymphoma, extranodal and solid organ sites: Secondary | ICD-10-CM

## 2012-12-18 DIAGNOSIS — D509 Iron deficiency anemia, unspecified: Secondary | ICD-10-CM

## 2012-12-18 DIAGNOSIS — D649 Anemia, unspecified: Secondary | ICD-10-CM

## 2012-12-18 LAB — IRON AND TIBC
Iron: 24 ug/dL — ABNORMAL LOW (ref 42–145)
UIBC: 159 ug/dL (ref 125–400)

## 2012-12-18 LAB — CBC WITH DIFFERENTIAL (CANCER CENTER ONLY)
BASO#: 0 10*3/uL (ref 0.0–0.2)
Eosinophils Absolute: 0.3 10*3/uL (ref 0.0–0.5)
HGB: 8.3 g/dL — ABNORMAL LOW (ref 11.6–15.9)
LYMPH%: 29.8 % (ref 14.0–48.0)
MCH: 26.9 pg (ref 26.0–34.0)
MCV: 86 fL (ref 81–101)
MONO%: 9.4 % (ref 0.0–13.0)
RBC: 3.08 10*6/uL — ABNORMAL LOW (ref 3.70–5.32)

## 2012-12-18 LAB — FERRITIN: Ferritin: 1612 ng/mL — ABNORMAL HIGH (ref 10–291)

## 2012-12-18 LAB — RETICULOCYTES (CHCC): Retic Ct Pct: 2.1 % (ref 0.4–2.3)

## 2012-12-18 NOTE — Progress Notes (Signed)
Diagnoses: #1 Diffuse large cell non-Hodgkin lymphoma-clinical remission. #2 iron deficiency anemia. #3.  Anemia, secondary to renal insufficiency. #4 proximal atrial fibrillation.  Current therapy: #1.  Observation. #2.  IV iron as indicated.  Last dose of IV iron was on 11/20/12 #3.  Aranesp 300 mcg subcutaneous every 4 weeks.  For hemoglobin less than 11. #4.  Pradaxa.  Interim history: [name] presents today for an office followup visit.  She recently just had an endoscopy and colonoscopy, done back on the be some her 07/18/2012.  This was warranted secondary to Hemoccult positive stool.  Her endoscopy and colonoscopy were essentially normal except for some inflammation, most likely related to NSAID use.  She does followup with her gastroenterologist at [facility].  She remains on Pradaxa for her atrial fibrillation.  The last, time, she received IV iron was back on 11/20/2012.Marland Kitchen  At that time, her ferritin was 394, with iron saturation of only 11%, and iron of 20.  She also receives Aranesp 300 mcg for hemoglobin less than 11.  She received this on a regular basis.  Unfortunately, the Aranesp, nor the IV iron, seems to be helping her blood, much.  Her hemoglobin today is 8.3.  A weighted definitely, suspected, and a higher, hemoglobin since she just recently had IV iron.  I do believe it is time, that we have to embark upon a reevaluation for relapsed lymphoma, with another bone marrow, biopsy.  I believe this will tell us what we need to know.  We will go ahead and get that set up. She otherwise reports, that she's doing well.  She has a good appetite.  She denies any nausea, vomiting, diarrhea, constipation, any chest pain, shortness of breath, or cough.  She does have some dyspnea on exertion.  She denies any fevers, chills, or night sweats.  She denies any palpable adenopathy.  She denies any unintentional weight loss or weight gain.  She denies any obvious, or abnormal bleeding.  She denies any  lower leg swelling.  She denies any headaches, visual changes, or rashes.   Review of Systems: Constitutional:Negative for malaise/fatigue, fever, chills, weight loss, diaphoresis, activity change, appetite change, and unexpected weight change.  HEENT: Negative for double vision, blurred vision, visual loss, ear pain, tinnitus, congestion, rhinorrhea, epistaxis sore throat or sinus disease, oral pain/lesion, tongue soreness Respiratory: Negative for cough, chest tightness, shortness of breath, wheezing and stridor.  Cardiovascular: Negative for chest pain, palpitations, leg swelling, orthopnea, PND, DOE or claudication Gastrointestinal: Negative for nausea, vomiting, abdominal pain, diarrhea, constipation, blood in stool, melena, hematochezia, abdominal distention, anal bleeding, rectal pain, anorexia and hematemesis.  Genitourinary: Negative for dysuria, frequency, hematuria,  Musculoskeletal: Negative for myalgias, back pain, joint swelling, arthralgias and gait problem.  Skin: Negative for rash, color change, pallor and wound.  Neurological:. Negative for dizziness/light-headedness, tremors, seizures, syncope, facial asymmetry, speech difficulty, weakness, numbness, headaches and paresthesias.  Hematological: Negative for adenopathy. Does not bruise/bleed easily.  Psychiatric/Behavioral:  Negative for depression, no loss of interest in normal activity or change in sleep pattern.   Physical Exam: .  [name] is a pleasant, 72 year old, well-developed, well-nourished, white female, in no obvious distress Vitals:  Temperature 97.9 degrees, pulse 57, respirations 16, blood pressure 120/68, weight 167 pounds HEENT reveals a normocephalic, atraumatic skull, no scleral icterus, no oral lesions  Neck is supple without any cervical or supraclavicular adenopathy.  Lungs are clear to auscultation bilaterally. There are no wheezes, rales or rhonci Cardiac is regular rate  and rhythm with a normal S1 and  S2. There are no murmurs, rubs, or bruits.  Abdomen is soft with good bowel sounds, there is no palpable mass. There is no palpable hepatosplenomegaly. There is no palpable fluid wave.  Musculoskeletal no tenderness of the spine, ribs, or hips.  Extremities there are no clubbing, cyanosis, or edema.  Skin no petechia, purpura or ecchymosis Neurologic is nonfocal.  Laboratory Data: White count 6.7, hemoglobin 8.3, hematocrit 26.5.  Platelets 367,000  Current Outpatient Prescriptions on File Prior to Visit  Medication Sig Dispense Refill  . amLODipine (NORVASC) 5 MG tablet Take 7.5 mg by mouth every morning.      Marland Kitchen aspirin EC 81 MG tablet Take 81 mg by mouth every morning.       . Calcium 500 MG tablet Take 600 mg by mouth daily.      . cholecalciferol (VITAMIN D) 1000 UNITS tablet Take 2,000 Units by mouth daily.        . dabigatran (PRADAXA) 75 MG CAPS Take 1 capsule (75 mg total) by mouth 2 (two) times daily.  60 capsule    . Dextromethorphan HBr (CREOMULSION ADULT) 20 MG/15ML SYRP Take 30 mLs by mouth 3 (three) times daily as needed. For cough and congestion      . esomeprazole (NEXIUM) 40 MG capsule Take 40 mg by mouth daily.        . fish oil-omega-3 fatty acids 1000 MG capsule Take 1 g by mouth daily.      . furosemide (LASIX) 40 MG tablet Take 40 mg by mouth every morning.       Marland Kitchen glimepiride (AMARYL) 4 MG tablet Take 4 mg by mouth daily.        Marland Kitchen lisinopril (PRINIVIL,ZESTRIL) 20 MG tablet Take 20 mg by mouth every morning.        . metoprolol (LOPRESSOR) 50 MG tablet Take 50 mg by mouth 2 (two) times daily.      . Multiple Vitamin (MULTIVITAMIN WITH MINERALS) TABS Take 1 tablet by mouth daily.      . rosuvastatin (CRESTOR) 5 MG tablet Take 10 mg by mouth at bedtime.       Marland Kitchen spironolactone (ALDACTONE) 25 MG tablet Take 25 mg by mouth daily.      . traMADol (ULTRAM) 50 MG tablet Take 50 mg by mouth every 6 (six) hours as needed. For pain. Maximum dose= 8 tablets per day       .  vitamin C (ASCORBIC ACID) 500 MG tablet Take 500 mg by mouth daily.         No current facility-administered medications on file prior to visit.   Assessment/Plan: this is a pleasant, 72 year old, white female, with the following issues:  #1.  History of large cell lymphoma.  She was treated with chemotherapy.  She is now 2 years out from her treatment.  This was completed back in August 2011.  I do believe we need to go ahead and do another bone marrow, biopsy.  We have done everything in terms of her anemia to increase her blood count, however, it is not working.  I feel bone marrow, biopsy is necessary.  We will go ahead and get that set up.  #2.  Intermittent iron deficiency.  She just had IV iron back in January.  #3.  Anemia of renal insufficiency.  She had been receiving Aranesp 300 mcg for hemoglobin less than 11.  Unfortunately, this did not seem to help her anemia.  #  4.  Atrial fibrillation.  She remains on Pradaxa.   #5.  Followup.  We will go ahead and plan for another bone marrow, biopsy.  Once that's done and we get the cytogenetics back, we will followup with [name].

## 2012-12-18 NOTE — Progress Notes (Signed)
Patient comes in today, CBC checked, Aranesp injection not given.  Hgb was 8.3.  Patient made aware, seen by Eunice Blase, PA. Awaiting bone marrow biopsy. Teola Bradley, Tatjana Turcott [employer]

## 2012-12-24 ENCOUNTER — Telehealth: Payer: Self-pay | Admitting: Hematology & Oncology

## 2012-12-24 ENCOUNTER — Ambulatory Visit
Admission: RE | Admit: 2012-12-24 | Discharge: 2012-12-24 | Disposition: A | Discharge status: Home / Self Care | Payer: Medicare Other | Source: Ambulatory Visit | Attending: Family Medicine | Admitting: Family Medicine

## 2012-12-24 ENCOUNTER — Other Ambulatory Visit: Payer: Self-pay | Admitting: Hematology & Oncology

## 2012-12-24 DIAGNOSIS — R1031 Right lower quadrant pain: Secondary | ICD-10-CM

## 2012-12-24 MED ORDER — IOHEXOL 300 MG/ML  SOLN
80.0000 mL | Freq: Once | INTRAMUSCULAR | Status: AC | PRN
  Administered 2012-12-24: 80 mL via INTRAVENOUS

## 2012-12-24 NOTE — Telephone Encounter (Signed)
Pt aware of 2-28 PET at [facility]. She is aware to be NPO 6 hrs.

## 2012-12-25 ENCOUNTER — Other Ambulatory Visit: Payer: Self-pay | Admitting: Radiology

## 2012-12-25 ENCOUNTER — Encounter (HOSPITAL_COMMUNITY): Payer: Self-pay | Admitting: Pharmacy Technician

## 2012-12-26 ENCOUNTER — Other Ambulatory Visit: Payer: Self-pay | Admitting: Radiology

## 2012-12-26 ENCOUNTER — Encounter (HOSPITAL_COMMUNITY)
Admission: RE | Admit: 2012-12-26 | Discharge: 2012-12-26 | Disposition: A | Discharge status: Home / Self Care | Payer: Medicare Other | Source: Ambulatory Visit | Attending: Hematology & Oncology | Admitting: Hematology & Oncology

## 2012-12-26 ENCOUNTER — Encounter (HOSPITAL_COMMUNITY): Payer: Self-pay

## 2012-12-26 DIAGNOSIS — C8589 Other specified types of non-Hodgkin lymphoma, extranodal and solid organ sites: Secondary | ICD-10-CM | POA: Insufficient documentation

## 2012-12-26 DIAGNOSIS — C8330 Diffuse large B-cell lymphoma, unspecified site: Secondary | ICD-10-CM

## 2012-12-26 MED ORDER — FLUDEOXYGLUCOSE F - 18 (FDG) INJECTION
19.8000 | Freq: Once | INTRAVENOUS | Status: AC | PRN
  Administered 2012-12-26: 19.8 via INTRAVENOUS

## 2012-12-27 ENCOUNTER — Telehealth: Payer: Self-pay | Admitting: Hematology & Oncology

## 2012-12-27 NOTE — Telephone Encounter (Signed)
I called [name] and give her the results of the PET scan. It looks like there is marked activity in the distal small bowel and cecum and some lymph nodes. There is , more than likely, recurrent lymphoma. It's been almost 3 years since she's been in remission. It would be unusual to have a recurrence in this area but not unheard of.  She is going for a bone marrow biopsy on Monday, March 3. We will see what this shows. If this does not give [place] a firm diagnosis, then the next step would be to see if gastroenterology can do a colonoscopy to get into this area of activity on PET/CT scan and do biopsies for [place].  [name] feels pretty well right now. There is still some tenderness over her on the right abdomen. She's eating okay. There's no diarrhea. There's no bleeding. There's no nausea or vomiting.  We will try to move things through as quickly as we can. I told her that it will take 3 or 4 days to get the bone marrow results back. Once I have them, then I will call her.  We will certainly pray hard for her.   Cindee Lame

## 2012-12-29 ENCOUNTER — Ambulatory Visit (HOSPITAL_COMMUNITY)
Admission: RE | Admit: 2012-12-29 | Discharge: 2012-12-29 | Disposition: A | Discharge status: Home / Self Care | Payer: Medicare Other | Source: Ambulatory Visit | Attending: Hematology & Oncology | Admitting: Hematology & Oncology

## 2012-12-29 ENCOUNTER — Encounter (HOSPITAL_COMMUNITY): Payer: Self-pay

## 2012-12-29 DIAGNOSIS — C8330 Diffuse large B-cell lymphoma, unspecified site: Secondary | ICD-10-CM

## 2012-12-29 DIAGNOSIS — D649 Anemia, unspecified: Secondary | ICD-10-CM | POA: Insufficient documentation

## 2012-12-29 DIAGNOSIS — D631 Anemia in chronic kidney disease: Secondary | ICD-10-CM

## 2012-12-29 DIAGNOSIS — I10 Essential (primary) hypertension: Secondary | ICD-10-CM | POA: Insufficient documentation

## 2012-12-29 DIAGNOSIS — Z79899 Other long term (current) drug therapy: Secondary | ICD-10-CM | POA: Insufficient documentation

## 2012-12-29 DIAGNOSIS — E119 Type 2 diabetes mellitus without complications: Secondary | ICD-10-CM | POA: Insufficient documentation

## 2012-12-29 DIAGNOSIS — C8589 Other specified types of non-Hodgkin lymphoma, extranodal and solid organ sites: Secondary | ICD-10-CM | POA: Insufficient documentation

## 2012-12-29 DIAGNOSIS — I252 Old myocardial infarction: Secondary | ICD-10-CM | POA: Insufficient documentation

## 2012-12-29 LAB — PROTIME-INR: Prothrombin Time: 15.8 seconds — ABNORMAL HIGH (ref 11.6–15.2)

## 2012-12-29 LAB — CBC
HCT: 24.7 % — ABNORMAL LOW (ref 36.0–46.0)
Hemoglobin: 7.5 g/dL — ABNORMAL LOW (ref 12.0–15.0)
MCH: 25.7 pg — ABNORMAL LOW (ref 26.0–34.0)
MCHC: 30.4 g/dL (ref 30.0–36.0)
MCV: 84.6 fL (ref 78.0–100.0)
Platelets: 335 10*3/uL (ref 150–400)
RBC: 2.92 MIL/uL — ABNORMAL LOW (ref 3.87–5.11)
RDW: 18.8 % — ABNORMAL HIGH (ref 11.5–15.5)
WBC: 6.2 10*3/uL (ref 4.0–10.5)

## 2012-12-29 LAB — GLUCOSE, CAPILLARY
Glucose-Capillary: 155 mg/dL — ABNORMAL HIGH (ref 70–99)
Glucose-Capillary: 109 mg/dL — ABNORMAL HIGH (ref 70–99)

## 2012-12-29 LAB — BONE MARROW EXAM: Bone Marrow Exam: 154

## 2012-12-29 MED ORDER — FENTANYL CITRATE 0.05 MG/ML IJ SOLN
INTRAMUSCULAR | Status: AC
  Filled 2012-12-29: qty 6

## 2012-12-29 MED ORDER — SODIUM CHLORIDE 0.9 % IV SOLN
INTRAVENOUS | Status: DC
  Administered 2012-12-29: 10:00:00 via INTRAVENOUS

## 2012-12-29 MED ORDER — MIDAZOLAM HCL 2 MG/2ML IJ SOLN
INTRAMUSCULAR | Status: AC
  Filled 2012-12-29: qty 6

## 2012-12-29 MED ORDER — HYDROCODONE-ACETAMINOPHEN 5-325 MG PO TABS
1.0000 | ORAL_TABLET | ORAL | Status: DC | PRN
  Filled 2012-12-29: qty 2

## 2012-12-29 MED ORDER — MIDAZOLAM HCL 2 MG/2ML IJ SOLN
INTRAMUSCULAR | Status: AC | PRN
  Administered 2012-12-29: 0.5 mg via INTRAVENOUS
  Administered 2012-12-29: 2 mg via INTRAVENOUS

## 2012-12-29 MED ORDER — FENTANYL CITRATE 0.05 MG/ML IJ SOLN
INTRAMUSCULAR | Status: AC | PRN
  Administered 2012-12-29: 100 ug via INTRAVENOUS
  Administered 2012-12-29: 50 ug via INTRAVENOUS

## 2012-12-29 NOTE — Procedures (Signed)
Interventional Radiology Procedure Note  Procedure: CT guided aspirate and core biopsy of right iliac bone Complications: None Recommendations: - Bedrest supine x 2 hrs - Hydrocodone PRN  Pain - Follow biopsy results  Signed,  Heath K. McCullough, MD Vascular & Interventional Radiologist [facility]  

## 2012-12-29 NOTE — H&P (Signed)
Chief Complaint: "I'm here for a bone marrow biopsy" Referring Physician:Ennever HPI: [name] is an 72 y.o. female with prior history of lymphoma about 3 years ago. She now has new finding of RLQ intraabdominal mass. She is referred for bone marrow biopsy as part of diagnostic workup. She has had BM Bx in the past but does not recall much about it. PMHx and meds reviewed.   Past Medical History:  Past Medical History  Diagnosis Date  . Diabetes mellitus without complication   . Hypertension   . Myocardial infarction 2011  . Anginal pain   . Headache   . Cancer     lymphoma  . Arthritis     Left hip  . Anemia of renal disease 09/17/2012  . Anxiety     h/o anxiety attacks    Past Surgical History:  Past Surgical History  Procedure Laterality Date  . Abdominal hysterectomy      30 yrs. ago  . Appendectomy  2009  . Tonsillectomy      age 24  . Cardiac catheterization  2011  . Rotator cuff repair      Right arm  . Esophagogastroduodenoscopy (egd) with propofol  10/06/2012    Procedure: ESOPHAGOGASTRODUODENOSCOPY (EGD) WITH PROPOFOL;  Surgeon: Willis Modena, MD;  Location: [facility];  Service: Endoscopy;  Laterality: N/A;  . Colonoscopy with propofol  10/06/2012    Procedure: COLONOSCOPY WITH PROPOFOL;  Surgeon: Willis Modena, MD;  Location: [facility];  Service: Endoscopy;  Laterality: N/A;    Family History: No family history on file.  Social History:  reports that she has quit smoking. She has never used smokeless tobacco. She reports that she does not drink alcohol or use illicit drugs.  Allergies:  Allergies  Allergen Reactions  . Advicor (Niacin-Lovastatin Er)     Muscle cramps  . Lipitor (Atorvastatin)     Muscle cramps  . Lovastatin     Muscle cramps  . Morphine And Related Other (See Comments)    "made me crazy"  . Niaspan (Niacin Er)     Muscle cramps  . Nsaids     Gi side effects  . Pravachol (Pravastatin Sodium)     Gi side effects  .  Sulfa Antibiotics Other (See Comments)    Can't remember   . Vytorin (Ezetimibe-Simvastatin) Nausea Only  . Welchol (Colesevelam Hcl)     Muscle cramps  . Zetia (Ezetimibe)     Muscle cramps   . Zocor (Simvastatin)     Muscle cramps     Medications: aspirin EC 81 MG tablet (Taking) Sig - Route: Take 81 mg by mouth every morning. - Oral Class: Historical Med Number of times this order has been changed since signing: 3 [address] Calcium 500 MG tablet (Taking) Sig - Route: Take 600 mg by mouth daily. - Oral Class: Historical Med Number of times this order has been changed since signing: 1 [address] cholecalciferol (VITAMIN D) 1000 UNITS tablet (Taking) Sig - Route: Take 2,000 Units by mouth daily. - Oral Class: Historical Med Number of times this order has been changed since signing: 2 [address] dabigatran (PRADAXA) 75 MG CAPS (Taking) 60 capsule 10/07/2012 Sig - Route: Take 1 capsule (75 mg total) by mouth 2 (two) times daily. - Oral Class: No Print Number of times this order has been changed since signing: 1 [address] Dextromethorphan HBr (CREOMULSION ADULT) 20 MG/15ML SYRP (Taking) Sig - Route: Take 30 mLs by mouth  3 (three) times daily as needed. For cough and congestion - Oral Class: Historical Med Number of times this order has been changed since signing: 1 [address] esomeprazole (NEXIUM) 40 MG capsule (Taking) Sig - Route: Take 40 mg by mouth daily. - Oral Class: Historical Med Number of times this order has been changed since signing: 2 Order [address] fish oil-omega-3 fatty acids 1000 MG capsule (Taking) Sig - Route: Take 1 g by mouth daily. - Oral Class: Historical Med Number of times this order has been changed since signing: 1 [address] furosemide (LASIX) 40 MG tablet (Taking) Sig - Route: Take 40 mg by mouth every morning. - Oral Class: Historical Med Number of times this order has been changed since signing: 3 Order [address] glimepiride  (AMARYL) 4 MG tablet (Taking) Sig - Route: Take 4 mg by mouth daily. - Oral Class: Historical Med Number of times this order has been changed since signing: 2 Order [address] lisinopril (PRINIVIL,ZESTRIL) 20 MG tablet (Taking) Sig - Route: Take 20 mg by mouth every morning. - Oral Class: Historical Med Number of times this order has been changed since signing: 2 Order [address] metoprolol (LOPRESSOR) 50 MG tablet (Taking) Sig - Route: Take 50 mg by mouth 2 (two) times daily. - Oral Class: Historical Med Number of times this order has been changed since signing: 1 Order [address] Multiple Vitamin (MULTIVITAMIN WITH MINERALS) TABS (Taking) Sig - Route: Take 1 tablet by mouth daily. - Oral Class: Historical Med Number of times this order has been changed since signing: 1 Order [address] traMADol (ULTRAM) 50 MG tablet (Taking) Sig - Route: Take 50 mg by mouth every 6 (six) hours as needed. For pain. Maximum dose= 8 tablets per day - Oral Class: Historical Med Number of times this order has been changed since signing: 2 Order [address] vitamin C (ASCORBIC ACID) 500 MG tablet (Taking) Sig - Route: Take 500 mg by mouth daily.    Please HPI for pertinent positives, otherwise complete 10 system ROS negative.  Physical Exam: Blood pressure 155/66, pulse 67, temperature 98.3 F (36.8 C), resp. rate 20, SpO2 99.00%. There is no weight on file to calculate BMI.   General Appearance:  Alert, cooperative, no distress, appears stated age  Head:  Normocephalic, without obvious abnormality, atraumatic  ENT: Unremarkable  Neck: Supple, symmetrical, trachea midline, no adenopathy, thyroid: not enlarged, symmetric, no tenderness/mass/nodules  Lungs:   Clear to auscultation bilaterally, no w/r/r, respirations unlabored without use of accessory muscles.  Chest Wall:  No tenderness or deformity  Heart:  Regular rate and rhythm, S1, S2 normal, no murmur, rub or gallop. Carotids 2+ without bruit.  Neurologic:  Normal affect, no gross deficits.   No results found for this or any previous visit (from the past 48 hour(s)). No results found.  Assessment/Plan RLQ mass, hx of lymphoma For CT guided BM Bx. Discussed procedure, risks, complications. Labs pending. Consent signed in chart  Brayton El PA-C 12/29/2012, 10:01 AM

## 2012-12-29 NOTE — H&P (Signed)
Agree with PA note.    Signed,  Heath K. McCullough, MD Vascular & Interventional Radiologist [facility]  

## 2013-01-02 LAB — CHROMOSOME ANALYSIS, BONE MARROW

## 2013-01-06 ENCOUNTER — Other Ambulatory Visit: Payer: Self-pay | Admitting: Medical

## 2013-01-06 DIAGNOSIS — C8330 Diffuse large B-cell lymphoma, unspecified site: Secondary | ICD-10-CM

## 2013-01-06 DIAGNOSIS — D509 Iron deficiency anemia, unspecified: Secondary | ICD-10-CM

## 2013-01-06 DIAGNOSIS — N189 Chronic kidney disease, unspecified: Secondary | ICD-10-CM

## 2013-01-07 ENCOUNTER — Telehealth: Payer: Self-pay | Admitting: Hematology & Oncology

## 2013-01-07 ENCOUNTER — Ambulatory Visit (HOSPITAL_BASED_OUTPATIENT_CLINIC_OR_DEPARTMENT_OTHER): Payer: Medicare Other | Admitting: Medical

## 2013-01-07 ENCOUNTER — Other Ambulatory Visit (HOSPITAL_BASED_OUTPATIENT_CLINIC_OR_DEPARTMENT_OTHER): Payer: Medicare Other | Admitting: Lab

## 2013-01-07 ENCOUNTER — Ambulatory Visit: Payer: Medicare Other

## 2013-01-07 ENCOUNTER — Encounter (HOSPITAL_COMMUNITY)
Admission: RE | Admit: 2013-01-07 | Discharge: 2013-01-07 | Disposition: A | Discharge status: Home / Self Care | Payer: Medicare Other | Source: Ambulatory Visit | Attending: Hematology & Oncology | Admitting: Hematology & Oncology

## 2013-01-07 VITALS — BP 116/56 | HR 62 | Temp 98.2°F | Resp 16 | Ht 62.0 in | Wt 167.0 lb

## 2013-01-07 DIAGNOSIS — C8589 Other specified types of non-Hodgkin lymphoma, extranodal and solid organ sites: Secondary | ICD-10-CM

## 2013-01-07 DIAGNOSIS — D631 Anemia in chronic kidney disease: Secondary | ICD-10-CM

## 2013-01-07 DIAGNOSIS — D649 Anemia, unspecified: Secondary | ICD-10-CM

## 2013-01-07 DIAGNOSIS — N289 Disorder of kidney and ureter, unspecified: Secondary | ICD-10-CM

## 2013-01-07 DIAGNOSIS — D509 Iron deficiency anemia, unspecified: Secondary | ICD-10-CM

## 2013-01-07 LAB — HOLD TUBE, BLOOD BANK - CHCC SATELLITE

## 2013-01-07 LAB — CBC WITH DIFFERENTIAL (CANCER CENTER ONLY)
BASO%: 0.5 % (ref 0.0–2.0)
Eosinophils Absolute: 0.4 10*3/uL (ref 0.0–0.5)
HCT: 24.7 % — ABNORMAL LOW (ref 34.8–46.6)
LYMPH#: 2.2 10*3/uL (ref 0.9–3.3)
MONO#: 0.8 10*3/uL (ref 0.1–0.9)
Platelets: 369 10*3/uL (ref 145–400)
RBC: 2.87 10*6/uL — ABNORMAL LOW (ref 3.70–5.32)
RDW: 18.8 % — ABNORMAL HIGH (ref 11.1–15.7)
WBC: 6.3 10*3/uL (ref 3.9–10.0)

## 2013-01-07 LAB — RETICULOCYTES (CHCC)
ABS Retic: 80.5 10*3/uL (ref 19.0–186.0)
Retic Ct Pct: 2.7 % — ABNORMAL HIGH (ref 0.4–2.3)

## 2013-01-07 NOTE — Progress Notes (Signed)
Diagnoses: #1 Diffuse large cell non-Hodgkin lymphoma-clinical remission. #2 iron deficiency anemia. #3.  Anemia, secondary to renal insufficiency. #4 proximal atrial fibrillation.  Current therapy: #1.  Observation. #2.  IV iron as indicated.  Last dose of IV iron was on 11/20/12 #3.  Aranesp 300 mcg subcutaneous every 4 weeks.  For hemoglobin less than 11. #4.  Pradaxa.  Interim history: [name] presents today for an office followup visit.  She did have a CT scan back on February, 26, which revealed a normal segmental wall thickening, in the right, abdominal loop of small bowel with associated increase in luminal caliber.  This is a characteristic appearance for small bowel lymphoma.  Prominent.  Abnormal adjacent mesenteric lymph nodes are present.  Apparent soft tissue filling defect at the ileocecal bowel may also reflect lymphomatous involvement.  Small hypodense lesions inferiorly in the spleen, stable from 2011, and, accordingly, potentially, benign.  We then had her do a PET scan.  Back on February, 28th, which revealed recurrent lymphoma, involving the distal small bowel, cecum, and multiple lymph nodes in the right lower quadrant.  This process is markedly hyper metabolic.  Outside of the mid right abdomen, no other disease, is demonstrated.  We decided to go ahead and do a bone marrow, biopsy, secondary to refractory anemia.  We had been given her Aranesp, as well as IV iron, and, unfortunately, she really was not responding.  Her bone marrow, biopsy.  Surprisingly, revealed hypercellular bone marrow for age with trilineage hematopoiesis.  Peripheral smear revealed normocytic, normochromic anemia.  The cytogenetics revealed no monoclonal B-cell population. She recently  had an endoscopy and colonoscopy, done back on 10/06/2012.  This was warranted secondary to Hemoccult positive stool.  Her endoscopy and colonoscopy were essentially normal except for some inflammation, most likely related  to NSAID use.  She does followup with her gastroenterologist at [facility].   She remains on Pradaxa for her atrial fibrillation.  The last, time, she received IV iron was back on 11/20/2012.Marland Kitchen  At that time, her ferritin was 394, with iron saturation of only 11%, and iron of 20.  She also receives Aranesp 300 mcg for hemoglobin less than 11.  She received this on a regular basis.  Unfortunately, the Aranesp, nor the IV iron, seems to be helping her blood, much.  Unfortunately, her hemoglobin is only 7.4.  Today.  She is quite symptomatic with weakness, and fatigue.  She does have some dyspnea on exertion.  We are been have to transfuse her with 2 units of packed red blood cells.  Honestly, I, believe she's going to  have to get back in with Dr. Dulce Sellar for repeat colonoscopy to rule out either colon cancer or recurrent lymphoma.  She is agreeable to this.  She still continues to report, a good appetite.  She denies any nausea, vomiting, diarrhea, constipation, any melena, or hematochezia.  She denies any other obvious or abnormal bleeding.  She denies any chest pain, shortness breath, or cough.  Any fevers, chills, or night sweats.  She denies any lower leg swelling.  She denies any headaches, visual changes, or rashes.  She does continue to report some right-sided abdominal pain.    Review of Systems: Constitutional:Negative for malaise/fatigue, fever, chills, weight loss, diaphoresis, activity change, appetite change, and unexpected weight change.  HEENT: Negative for double vision, blurred vision, visual loss, ear pain, tinnitus, congestion, rhinorrhea, epistaxis sore throat or sinus disease, oral pain/lesion, tongue soreness Respiratory: Negative for cough, chest  tightness, shortness of breath, wheezing and stridor.  Cardiovascular: Negative for chest pain, palpitations, leg swelling, orthopnea, PND, DOE or claudication Gastrointestinal: Negative for nausea, vomiting, abdominal pain, diarrhea, constipation,  blood in stool, melena, hematochezia, abdominal distention, anal bleeding, rectal pain, anorexia and hematemesis.  Genitourinary: Negative for dysuria, frequency, hematuria,  Musculoskeletal: Negative for myalgias, back pain, joint swelling, arthralgias and gait problem.  Skin: Negative for rash, color change, pallor and wound.  Neurological:. Negative for dizziness/light-headedness, tremors, seizures, syncope, facial asymmetry, speech difficulty, weakness, numbness, headaches and paresthesias.  Hematological: Negative for adenopathy. Does not bruise/bleed easily.  Psychiatric/Behavioral:  Negative for depression, no loss of interest in normal activity or change in sleep pattern.   Physical Exam: .  [name] is a pleasant, 72 year old, well-developed, well-nourished, white female, in no obvious distress Vitals:  Temperature 98.2 degrees, pulse 62, respirations 16, blood pressure 116/56, weight 167 pounds HEENT reveals a normocephalic, atraumatic skull, no scleral icterus, no oral lesions  Neck is supple without any cervical or supraclavicular adenopathy.  Lungs are clear to auscultation bilaterally. There are no wheezes, rales or rhonci Cardiac is regular rate and rhythm with a normal S1 and S2. There are no murmurs, rubs, or bruits.  Abdomen is soft with good bowel sounds, there is no palpable mass. There is no palpable hepatosplenomegaly. There is no palpable fluid wave.  She does have some tenderness over her right sided.  Abdomen. Musculoskeletal no tenderness of the spine, ribs, or hips.  Extremities there are no clubbing, cyanosis, or edema.  Skin no petechia, purpura or ecchymosis Neurologic is nonfocal.  Laboratory Data: White count 6.3, hemoglobin 10.4, hematocrit 24.7, platelets 369,000  Current Outpatient Prescriptions on File Prior to Visit  Medication Sig Dispense Refill  . Aromatic Inhalants (VICKS VAPOINHALER) INHA Inhale 1 puff into the lungs daily as needed.      Marland Kitchen  aspirin EC 81 MG tablet Take 81 mg by mouth every morning.       . Calcium 500 MG tablet Take 600 mg by mouth daily.      . cholecalciferol (VITAMIN D) 1000 UNITS tablet Take 2,000 Units by mouth daily.        . dabigatran (PRADAXA) 75 MG CAPS Take 1 capsule (75 mg total) by mouth 2 (two) times daily.  60 capsule    . Dextromethorphan HBr (CREOMULSION ADULT) 20 MG/15ML SYRP Take 30 mLs by mouth 3 (three) times daily as needed. For cough and congestion      . esomeprazole (NEXIUM) 40 MG capsule Take 40 mg by mouth daily.        . fish oil-omega-3 fatty acids 1000 MG capsule Take 1 g by mouth daily.      . furosemide (LASIX) 40 MG tablet Take 40 mg by mouth every morning.       Marland Kitchen glimepiride (AMARYL) 4 MG tablet Take 4 mg by mouth daily.        Marland Kitchen lisinopril (PRINIVIL,ZESTRIL) 20 MG tablet Take 20 mg by mouth every morning.        . metoprolol (LOPRESSOR) 50 MG tablet Take 50 mg by mouth 2 (two) times daily.      . Multiple Vitamin (MULTIVITAMIN WITH MINERALS) TABS Take 1 tablet by mouth daily.      . traMADol (ULTRAM) 50 MG tablet Take 50 mg by mouth every 6 (six) hours as needed. For pain. Maximum dose= 8 tablets per day       . vitamin C (ASCORBIC ACID) 500 MG  tablet Take 500 mg by mouth daily.         No current facility-administered medications on file prior to visit.   Assessment/Plan: this is a pleasant, 72 year old, white female, with the following issues:  #1.  History of large cell lymphoma.  She was treated with chemotherapy.  She is now 2 years out from her treatment.  This was completed back in August 2011.  Unfortunately, I do, believe that she could possibly have recurrent lymphoma of the colon.  There is definitely, hypermetabolic activity per her recent PET scan.  She is having right-sided abdominal pain.  She is quite anemic. Her bone marrow, biopsy.  Did not reveal any lymphoma within the bone marrow.  At this point in time, we need to refer.  Her back to Dr. Dulce Sellar for repeat  colonoscopy.  We will go ahead and get that done today.  #2.  Anemia.  Unfortunately, she's not responded well to Aranesp, were IV iron.  She likely has an active bleed, most likely correlated with what is going on in her colon.  We will get her set up for a colonoscopy as soon as possible.  #3.  Intermittent iron deficiency.  She just had IV iron back in January.  #4.  Anemia of renal insufficiency.  She had been receiving Aranesp 300 mcg for hemoglobin less than 11.  Unfortunately, this did not seem to help her anemia.  We will discontinue the Aranesp for now.  #5.  Atrial fibrillation.  She remains on Pradaxa.   #6.  Followup.  We will follow back up with Ms. Newby after her colonoscopy, and depending on those results go from there.

## 2013-01-07 NOTE — Telephone Encounter (Signed)
Pt aware of 3-14 appointment with Dr. Dulce Sellar. She is aware to be there at 415pm. Faxed records to [facility] [phone]

## 2013-01-07 NOTE — Progress Notes (Signed)
Patient seen by Eunice Blase, PA.  Hold Aranesp, not working, to get blood 01/08/13.

## 2013-01-08 ENCOUNTER — Ambulatory Visit (HOSPITAL_BASED_OUTPATIENT_CLINIC_OR_DEPARTMENT_OTHER): Payer: Medicare Other

## 2013-01-08 VITALS — BP 126/69 | HR 64 | Temp 97.6°F | Resp 20

## 2013-01-08 DIAGNOSIS — D649 Anemia, unspecified: Secondary | ICD-10-CM

## 2013-01-08 MED ORDER — DIPHENHYDRAMINE HCL 25 MG PO CAPS
25.0000 mg | ORAL_CAPSULE | Freq: Once | ORAL | Status: AC
  Administered 2013-01-08: 25 mg via ORAL

## 2013-01-08 MED ORDER — SODIUM CHLORIDE 0.9 % IV SOLN
250.0000 mL | Freq: Once | INTRAVENOUS | Status: AC
  Administered 2013-01-08: 250 mL via INTRAVENOUS

## 2013-01-08 MED ORDER — ACETAMINOPHEN 325 MG PO TABS
650.0000 mg | ORAL_TABLET | Freq: Once | ORAL | Status: AC
  Administered 2013-01-08: 650 mg via ORAL

## 2013-01-08 NOTE — Patient Instructions (Signed)
Blood Transfusion Information WHAT IS A BLOOD TRANSFUSION? A transfusion is the replacement of blood or some of its parts. Blood is made up of multiple cells which provide different functions.  Red blood cells carry oxygen and are used for blood loss replacement.  White blood cells fight against infection.  Platelets control bleeding.  Plasma helps clot blood.  Other blood products are available for specialized needs, such as hemophilia or other clotting disorders. BEFORE THE TRANSFUSION  Who gives blood for transfusions?   You may be able to donate blood to be used at a later date on yourself (autologous donation).  Relatives can be asked to donate blood. This is generally not any safer than if you have received blood from a stranger. The same precautions are taken to ensure safety when a relative's blood is donated.  Healthy volunteers who are fully evaluated to make sure their blood is safe. This is blood bank blood. Transfusion therapy is the safest it has ever been in the practice of medicine. Before blood is taken from a donor, a complete history is taken to make sure that person has no history of diseases nor engages in risky social behavior (examples are intravenous drug use or sexual activity with multiple partners). The donor's travel history is screened to minimize risk of transmitting infections, such as malaria. The donated blood is tested for signs of infectious diseases, such as HIV and hepatitis. The blood is then tested to be sure it is compatible with you in order to minimize the chance of a transfusion reaction. If you or a relative donates blood, this is often done in anticipation of surgery and is not appropriate for emergency situations. It takes many days to process the donated blood. RISKS AND COMPLICATIONS Although transfusion therapy is very safe and saves many lives, the main dangers of transfusion include:   Getting an infectious disease.  Developing a  transfusion reaction. This is an allergic reaction to something in the blood you were given. Every precaution is taken to prevent this. The decision to have a blood transfusion has been considered carefully by your caregiver before blood is given. Blood is not given unless the benefits outweigh the risks. AFTER THE TRANSFUSION  Right after receiving a blood transfusion, you will usually feel much better and more energetic. This is especially true if your red blood cells have gotten low (anemic). The transfusion raises the level of the red blood cells which carry oxygen, and this usually causes an energy increase.  The nurse administering the transfusion will monitor you carefully for complications. HOME CARE INSTRUCTIONS  No special instructions are needed after a transfusion. You may find your energy is better. Speak with your caregiver about any limitations on activity for underlying diseases you may have. SEEK MEDICAL CARE IF:   Your condition is not improving after your transfusion.  You develop redness or irritation at the intravenous (IV) site. SEEK IMMEDIATE MEDICAL CARE IF:  Any of the following symptoms occur over the next 12 hours:  Shaking chills.  You have a temperature by mouth above 102 F (38.9 C), not controlled by medicine.  Chest, back, or muscle pain.  People around you feel you are not acting correctly or are confused.  Shortness of breath or difficulty breathing.  Dizziness and fainting.  You get a rash or develop hives.  You have a decrease in urine output.  Your urine turns a dark color or changes to pink, red, or brown. Any of the following   symptoms occur over the next 10 days:  You have a temperature by mouth above 102 F (38.9 C), not controlled by medicine.  Shortness of breath.  Weakness after normal activity.  The white part of the eye turns yellow (jaundice).  You have a decrease in the amount of urine or are urinating less often.  Your  urine turns a dark color or changes to pink, red, or brown. Document Released: 10/12/2000 Document Revised: 01/07/2012 Document Reviewed: 05/31/2008 [employer] Patient Information 2013 [employer].  

## 2013-01-09 LAB — TYPE AND SCREEN
ABO/RH(D): A POS
Antibody Screen: NEGATIVE
Unit division: 0

## 2013-01-13 ENCOUNTER — Encounter (HOSPITAL_COMMUNITY): Payer: Self-pay | Admitting: Pharmacy Technician

## 2013-01-13 ENCOUNTER — Encounter (HOSPITAL_COMMUNITY): Payer: Self-pay | Admitting: *Deleted

## 2013-01-14 ENCOUNTER — Ambulatory Visit (HOSPITAL_COMMUNITY)
Admission: RE | Admit: 2013-01-14 | Discharge: 2013-01-14 | Disposition: A | Discharge status: Home / Self Care | Payer: Medicare Other | Source: Ambulatory Visit | Attending: Gastroenterology | Admitting: Gastroenterology

## 2013-01-14 ENCOUNTER — Encounter (HOSPITAL_COMMUNITY): Payer: Self-pay | Admitting: *Deleted

## 2013-01-14 ENCOUNTER — Ambulatory Visit (HOSPITAL_COMMUNITY): Payer: Medicare Other | Admitting: Anesthesiology

## 2013-01-14 ENCOUNTER — Encounter (HOSPITAL_COMMUNITY)
Admission: RE | Disposition: A | Discharge status: Home / Self Care | Payer: Self-pay | Source: Ambulatory Visit | Attending: Gastroenterology

## 2013-01-14 ENCOUNTER — Encounter (HOSPITAL_COMMUNITY): Payer: Self-pay | Admitting: Anesthesiology

## 2013-01-14 ENCOUNTER — Encounter: Payer: Self-pay | Admitting: Hematology & Oncology

## 2013-01-14 DIAGNOSIS — D649 Anemia, unspecified: Secondary | ICD-10-CM | POA: Insufficient documentation

## 2013-01-14 DIAGNOSIS — Z79899 Other long term (current) drug therapy: Secondary | ICD-10-CM | POA: Insufficient documentation

## 2013-01-14 DIAGNOSIS — Z7982 Long term (current) use of aspirin: Secondary | ICD-10-CM | POA: Insufficient documentation

## 2013-01-14 DIAGNOSIS — E119 Type 2 diabetes mellitus without complications: Secondary | ICD-10-CM | POA: Insufficient documentation

## 2013-01-14 DIAGNOSIS — C8589 Other specified types of non-Hodgkin lymphoma, extranodal and solid organ sites: Secondary | ICD-10-CM | POA: Insufficient documentation

## 2013-01-14 DIAGNOSIS — I252 Old myocardial infarction: Secondary | ICD-10-CM | POA: Insufficient documentation

## 2013-01-14 DIAGNOSIS — D126 Benign neoplasm of colon, unspecified: Secondary | ICD-10-CM | POA: Insufficient documentation

## 2013-01-14 DIAGNOSIS — R1031 Right lower quadrant pain: Secondary | ICD-10-CM | POA: Insufficient documentation

## 2013-01-14 DIAGNOSIS — I10 Essential (primary) hypertension: Secondary | ICD-10-CM | POA: Insufficient documentation

## 2013-01-14 HISTORY — PX: COLONOSCOPY WITH PROPOFOL: SHX5780

## 2013-01-14 SURGERY — COLONOSCOPY WITH PROPOFOL
Anesthesia: Monitor Anesthesia Care

## 2013-01-14 MED ORDER — LACTATED RINGERS IV SOLN
INTRAVENOUS | Status: DC
  Administered 2013-01-14: 1000 mL via INTRAVENOUS

## 2013-01-14 MED ORDER — PROPOFOL 10 MG/ML IV EMUL
INTRAVENOUS | Status: DC | PRN
  Administered 2013-01-14: 120 ug/kg/min via INTRAVENOUS

## 2013-01-14 MED ORDER — LACTATED RINGERS IV SOLN
INTRAVENOUS | Status: DC | PRN
  Administered 2013-01-14: 10:00:00 via INTRAVENOUS

## 2013-01-14 SURGICAL SUPPLY — 22 items

## 2013-01-14 NOTE — H&P (View-Only) (Signed)
Chief Complaint: "I'm here for a bone marrow biopsy" Referring Physician:Ennever HPI: [name] is an 72 y.o. female with prior history of lymphoma about 3 years ago. She now has new finding of RLQ intraabdominal mass. She is referred for bone marrow biopsy as part of diagnostic workup. She has had BM Bx in the past but does not recall much about it. PMHx and meds reviewed.   Past Medical History:  Past Medical History  Diagnosis Date  . Diabetes mellitus without complication   . Hypertension   . Myocardial infarction 2011  . Anginal pain   . Headache   . Cancer     lymphoma  . Arthritis     Left hip  . Anemia of renal disease 09/17/2012  . Anxiety     h/o anxiety attacks    Past Surgical History:  Past Surgical History  Procedure Laterality Date  . Abdominal hysterectomy      30 yrs. ago  . Appendectomy  2009  . Tonsillectomy      age 29  . Cardiac catheterization  2011  . Rotator cuff repair      Right arm  . Esophagogastroduodenoscopy (egd) with propofol  10/06/2012    Procedure: ESOPHAGOGASTRODUODENOSCOPY (EGD) WITH PROPOFOL;  Surgeon: Willis Modena, MD;  Location: [facility];  Service: Endoscopy;  Laterality: N/A;  . Colonoscopy with propofol  10/06/2012    Procedure: COLONOSCOPY WITH PROPOFOL;  Surgeon: Willis Modena, MD;  Location: [facility];  Service: Endoscopy;  Laterality: N/A;    Family History: No family history on file.  Social History:  reports that she has quit smoking. She has never used smokeless tobacco. She reports that she does not drink alcohol or use illicit drugs.  Allergies:  Allergies  Allergen Reactions  . Advicor (Niacin-Lovastatin Er)     Muscle cramps  . Lipitor (Atorvastatin)     Muscle cramps  . Lovastatin     Muscle cramps  . Morphine And Related Other (See Comments)    "made me crazy"  . Niaspan (Niacin Er)     Muscle cramps  . Nsaids     Gi side effects  . Pravachol (Pravastatin Sodium)     Gi side effects  .  Sulfa Antibiotics Other (See Comments)    Can't remember   . Vytorin (Ezetimibe-Simvastatin) Nausea Only  . Welchol (Colesevelam Hcl)     Muscle cramps  . Zetia (Ezetimibe)     Muscle cramps   . Zocor (Simvastatin)     Muscle cramps     Medications: aspirin EC 81 MG tablet (Taking) Sig - Route: Take 81 mg by mouth every morning. - Oral Class: Historical Med Number of times this order has been changed since signing: 3 [address] Calcium 500 MG tablet (Taking) Sig - Route: Take 600 mg by mouth daily. - Oral Class: Historical Med Number of times this order has been changed since signing: 1 Order [address] cholecalciferol (VITAMIN D) 1000 UNITS tablet (Taking) Sig - Route: Take 2,000 Units by mouth daily. - Oral Class: Historical Med Number of times this order has been changed since signing: 2 Order [address] dabigatran (PRADAXA) 75 MG CAPS (Taking) 60 capsule 10/07/2012 Sig - Route: Take 1 capsule (75 mg total) by mouth 2 (two) times daily. - Oral Class: No Print Number of times this order has been changed since signing: 1 Order [address] Dextromethorphan HBr (CREOMULSION ADULT) 20 MG/15ML SYRP (Taking) Sig - Route: Take 30 mLs by mouth  3 (three) times daily as needed. For cough and congestion - Oral Class: Historical Med Number of times this order has been changed since signing: 1 Order [address] esomeprazole (NEXIUM) 40 MG capsule (Taking) Sig - Route: Take 40 mg by mouth daily. - Oral Class: Historical Med Number of times this order has been changed since signing: 2 Order [address] fish oil-omega-3 fatty acids 1000 MG capsule (Taking) Sig - Route: Take 1 g by mouth daily. - Oral Class: Historical Med Number of times this order has been changed since signing: 1 Order [address] furosemide (LASIX) 40 MG tablet (Taking) Sig - Route: Take 40 mg by mouth every morning. - Oral Class: Historical Med Number of times this order has been changed since signing: 3 Order [address] glimepiride  (AMARYL) 4 MG tablet (Taking) Sig - Route: Take 4 mg by mouth daily. - Oral Class: Historical Med Number of times this order has been changed since signing: 2 Order [address] lisinopril (PRINIVIL,ZESTRIL) 20 MG tablet (Taking) Sig - Route: Take 20 mg by mouth every morning. - Oral Class: Historical Med Number of times this order has been changed since signing: 2 Order [address] metoprolol (LOPRESSOR) 50 MG tablet (Taking) Sig - Route: Take 50 mg by mouth 2 (two) times daily. - Oral Class: Historical Med Number of times this order has been changed since signing: 1 Order [address] Multiple Vitamin (MULTIVITAMIN WITH MINERALS) TABS (Taking) Sig - Route: Take 1 tablet by mouth daily. - Oral Class: Historical Med Number of times this order has been changed since signing: 1 Order [address] traMADol (ULTRAM) 50 MG tablet (Taking) Sig - Route: Take 50 mg by mouth every 6 (six) hours as needed. For pain. Maximum dose= 8 tablets per day - Oral Class: Historical Med Number of times this order has been changed since signing: 2 Order [address] vitamin C (ASCORBIC ACID) 500 MG tablet (Taking) Sig - Route: Take 500 mg by mouth daily.    Please HPI for pertinent positives, otherwise complete 10 system ROS negative.  Physical Exam: Blood pressure 155/66, pulse 67, temperature 98.3 F (36.8 C), resp. rate 20, SpO2 99.00%. There is no weight on file to calculate BMI.   General Appearance:  Alert, cooperative, no distress, appears stated age  Head:  Normocephalic, without obvious abnormality, atraumatic  ENT: Unremarkable  Neck: Supple, symmetrical, trachea midline, no adenopathy, thyroid: not enlarged, symmetric, no tenderness/mass/nodules  Lungs:   Clear to auscultation bilaterally, no w/r/r, respirations unlabored without use of accessory muscles.  Chest Wall:  No tenderness or deformity  Heart:  Regular rate and rhythm, S1, S2 normal, no murmur, rub or gallop. Carotids 2+ without bruit.  Neurologic:  Normal affect, no gross deficits.   No results found for this or any previous visit (from the past 48 hour(s)). No results found.  Assessment/Plan RLQ mass, hx of lymphoma For CT guided BM Bx. Discussed procedure, risks, complications. Labs pending. Consent signed in chart  Brayton El PA-C 12/29/2012, 10:01 AM

## 2013-01-14 NOTE — Preoperative (Signed)
Beta Blockers   Reason not to administer Beta Blockers:Not Applicable 

## 2013-01-14 NOTE — Interval H&P Note (Signed)
History and Physical Interval Note:  01/14/2013 11:18 AM  [name]  has presented today for surgery, with the diagnosis of abnormal ct scan  The various methods of treatment have been discussed with the patient and family. After consideration of risks, benefits and other options for treatment, the patient has consented to  Procedure(s): COLONOSCOPY WITH PROPOFOL (N/A) as a surgical intervention .  The patient's history has been reviewed, patient examined, no change in status, stable for surgery.  I have reviewed the patient's chart and labs.  Questions were answered to the patient's satisfaction.     [name]  Assessment:  1.  Abnormal CT abdomen. 2.  Anemia. 3.  Right lower quadrant abdominal pain.  Plan:  1.  Colonoscopy. 2.  Risks (bleeding, infection, bowel perforation that could require surgery, sedation-related changes in cardiopulmonary systems), benefits (identification and possible treatment of source of symptoms, exclusion of certain causes of symptoms), and alternatives (watchful waiting, radiographic imaging studies, empiric medical treatment) of colonoscopy were explained to patient/family in detail and patient wishes to proceed.

## 2013-01-14 NOTE — Anesthesia Postprocedure Evaluation (Signed)
  Anesthesia Post-op Note  Patient: [name]  Procedure(s) Performed: Procedure(s) (LRB): COLONOSCOPY WITH PROPOFOL (N/A)  Patient Location: PACU  Anesthesia Type: General  Level of Consciousness: awake and alert   Airway and Oxygen Therapy: Patient Spontanous Breathing  Post-op Pain: mild  Post-op Assessment: Post-op Vital signs reviewed, Patient's Cardiovascular Status Stable, Respiratory Function Stable, Patent Airway and No signs of Nausea or vomiting  Last Vitals:  Filed Vitals:   01/14/13 1230  BP: 127/67  Temp:   Resp: 17    Post-op Vital Signs: stable   Complications: No apparent anesthesia complications

## 2013-01-14 NOTE — Transfer of Care (Signed)
Immediate Anesthesia Transfer of Care Note  Patient: [name]  Procedure(s) Performed: Procedure(s): COLONOSCOPY WITH PROPOFOL (N/A)  Patient Location: PACU  Anesthesia Type:MAC  Level of Consciousness: sedated, patient cooperative and responds to stimulation  Airway & Oxygen Therapy: Patient Spontanous Breathing and Patient connected to face mask oxygen  Post-op Assessment: Report given to PACU RN, Post -op Vital signs reviewed and stable and Patient moving all extremities X 4  Post vital signs: Reviewed and stable  Complications: No apparent anesthesia complications

## 2013-01-14 NOTE — Op Note (Signed)
[facility] [address] [place], [address]   COLONOSCOPY PROCEDURE REPORT  PATIENT: [name], [name]  MR#: [id number] BIRTHDATE: [birth year] , 71  yrs. old GENDER: Female ENDOSCOPIST: Willis Modena, MD REFERRED XB:[id number] Myna Hidalgo, M.D. PROCEDURE DATE:  01/14/2013 PROCEDURE:   Colonoscopy with biopsy ASA CLASS:   Class III INDICATIONS:right lower quadrant abdominal pain, abnormal CT scan, anemia. MEDICATIONS: MAC sedation, administered by CRNA  DESCRIPTION OF PROCEDURE:   After the risks benefits and alternatives of the procedure were thoroughly explained, informed consent was obtained.  A digital rectal exam revealed no abnormalities of the rectum.   The Pentax Ped Colon [id number] endoscope was introduced through the anus and advanced to the terminal ileum which was intubated for a short distance. No adverse events experienced.   The quality of the prep was adequate.  The instrument was then slowly withdrawn as the colon was fully examined.    Findings: External hemorrhoids, otherwise normal digital rectal exam.  Prep quality was fair in right colon and good in transverse and left colon.  At the level of the ileocecal valve, an ulcerated indurated fibrotic lesion was seen.  This was much more prominent when in comparison to appearance three months ago.  The lesion appears to extend into the terminal ileum, whereas the terminal ileum was normal three months ago.  Multiple biopsies of this lesion were taken with cold biopsy forceps. Two diminutive polyps, removed with cold biopsy forceps.  Forward and retroflexed view of rectum were normal.       Withdrawal time was about 10 minutes.  The scope was withdrawn and the procedure completed.  ENDOSCOPIC IMPRESSION:     As above.  Rapid progression of enlargement of ileocecal valve ulcerations, with biopsies three months ago showing no evidence of malignancy, current appearance worrisome for  lymphoma.  RECOMMENDATIONS:     1.  Watch for potential complications of procedure. 2.  Await biopsy results (sent "rush"). 3.  OK to resume Pradaxa tomorrow. 4.  I have discussed preliminary resutls with Eunice Blase, Dr. Gustavo Lah PA (he is out-of-office today).  eSigned:  Willis Modena, MD 01/14/2013 12:29 PM   cc:

## 2013-01-14 NOTE — Anesthesia Preprocedure Evaluation (Addendum)
Anesthesia Evaluation  Patient identified by MRN, date of birth, ID band Patient awake    Reviewed: Allergy & Precautions, H&P , NPO status , Patient's Chart, lab work & pertinent test results  Airway Mallampati: II TM Distance: >3 FB Neck ROM: Full    Dental no notable dental hx.    Pulmonary neg pulmonary ROS,  breath sounds clear to auscultation  Pulmonary exam normal       Cardiovascular Exercise Tolerance: Good hypertension, Pt. on medications and Pt. on home beta blockers + angina + Past MI Rhythm:Regular Rate:Normal + Systolic Click Anemia hgb 7.4 which was followed by pRBC transfusion.  Denies recent chest pains. MI 2011   Neuro/Psych  Headaches, Anxiety    GI/Hepatic Neg liver ROS, GERD-  Medicated,  Endo/Other  diabetes, Type 2, Oral Hypoglycemic Agents  Renal/GU negative Renal ROS  negative genitourinary   Musculoskeletal negative musculoskeletal ROS (+)   Abdominal (+) + obese,   Peds negative pediatric ROS (+)  Hematology negative hematology ROS (+)   Anesthesia Other Findings   Reproductive/Obstetrics negative OB ROS                          Anesthesia Physical Anesthesia Plan  ASA: III  Anesthesia Plan: MAC   Post-op Pain Management:    Induction:   Airway Management Planned:   Additional Equipment:   Intra-op Plan:   Post-operative Plan:   Informed Consent:   Plan Discussed with:   Anesthesia Plan Comments:         Anesthesia Quick Evaluation

## 2013-01-15 ENCOUNTER — Encounter (HOSPITAL_COMMUNITY): Payer: Self-pay | Admitting: Gastroenterology

## 2013-01-20 ENCOUNTER — Other Ambulatory Visit: Payer: Self-pay | Admitting: Hematology & Oncology

## 2013-01-20 DIAGNOSIS — C8590 Non-Hodgkin lymphoma, unspecified, unspecified site: Secondary | ICD-10-CM

## 2013-01-21 ENCOUNTER — Inpatient Hospital Stay (HOSPITAL_COMMUNITY): Payer: Medicare Other

## 2013-01-21 ENCOUNTER — Inpatient Hospital Stay (HOSPITAL_COMMUNITY)
Admission: AD | Admit: 2013-01-21 | Discharge: 2013-01-27 | DRG: 847 | Disposition: A | Discharge status: Home / Self Care | Payer: Medicare Other | Source: Ambulatory Visit | Attending: Hematology & Oncology | Admitting: Hematology & Oncology

## 2013-01-21 ENCOUNTER — Other Ambulatory Visit: Payer: Self-pay

## 2013-01-21 ENCOUNTER — Encounter (HOSPITAL_COMMUNITY): Payer: Self-pay

## 2013-01-21 DIAGNOSIS — C8589 Other specified types of non-Hodgkin lymphoma, extranodal and solid organ sites: Secondary | ICD-10-CM | POA: Diagnosis present

## 2013-01-21 DIAGNOSIS — F411 Generalized anxiety disorder: Secondary | ICD-10-CM | POA: Diagnosis present

## 2013-01-21 DIAGNOSIS — N189 Chronic kidney disease, unspecified: Secondary | ICD-10-CM

## 2013-01-21 DIAGNOSIS — I10 Essential (primary) hypertension: Secondary | ICD-10-CM | POA: Diagnosis present

## 2013-01-21 DIAGNOSIS — C8330 Diffuse large B-cell lymphoma, unspecified site: Secondary | ICD-10-CM

## 2013-01-21 DIAGNOSIS — D509 Iron deficiency anemia, unspecified: Secondary | ICD-10-CM

## 2013-01-21 DIAGNOSIS — Z5111 Encounter for antineoplastic chemotherapy: Principal | ICD-10-CM

## 2013-01-21 DIAGNOSIS — I252 Old myocardial infarction: Secondary | ICD-10-CM

## 2013-01-21 DIAGNOSIS — R0600 Dyspnea, unspecified: Secondary | ICD-10-CM

## 2013-01-21 DIAGNOSIS — C8590 Non-Hodgkin lymphoma, unspecified, unspecified site: Secondary | ICD-10-CM

## 2013-01-21 DIAGNOSIS — E119 Type 2 diabetes mellitus without complications: Secondary | ICD-10-CM | POA: Diagnosis present

## 2013-01-21 DIAGNOSIS — Z79899 Other long term (current) drug therapy: Secondary | ICD-10-CM | POA: Diagnosis present

## 2013-01-21 DIAGNOSIS — D469 Myelodysplastic syndrome, unspecified: Secondary | ICD-10-CM | POA: Diagnosis present

## 2013-01-21 DIAGNOSIS — J811 Chronic pulmonary edema: Secondary | ICD-10-CM | POA: Diagnosis present

## 2013-01-21 DIAGNOSIS — C8588 Other specified types of non-Hodgkin lymphoma, lymph nodes of multiple sites: Secondary | ICD-10-CM

## 2013-01-21 DIAGNOSIS — D649 Anemia, unspecified: Secondary | ICD-10-CM | POA: Diagnosis present

## 2013-01-21 DIAGNOSIS — D631 Anemia in chronic kidney disease: Secondary | ICD-10-CM

## 2013-01-21 DIAGNOSIS — I4891 Unspecified atrial fibrillation: Secondary | ICD-10-CM | POA: Diagnosis present

## 2013-01-21 LAB — COMPREHENSIVE METABOLIC PANEL
ALT: 8 U/L (ref 0–35)
Albumin: 2.4 g/dL — ABNORMAL LOW (ref 3.5–5.2)
Alkaline Phosphatase: 69 U/L (ref 39–117)
Chloride: 97 mEq/L (ref 96–112)
GFR calc Af Amer: 60 mL/min — ABNORMAL LOW (ref >90)
Glucose, Bld: 134 mg/dL — ABNORMAL HIGH (ref 70–99)
Potassium: 3.6 mEq/L (ref 3.5–5.1)
Sodium: 138 mEq/L (ref 135–145)
Total Bilirubin: 0.3 mg/dL (ref 0.3–1.2)
Total Protein: 6 g/dL (ref 6.0–8.3)

## 2013-01-21 LAB — CBC
HCT: 30.5 % — ABNORMAL LOW (ref 36.0–46.0)
Hemoglobin: 9.9 g/dL — ABNORMAL LOW (ref 12.0–15.0)
MCHC: 32.5 g/dL (ref 30.0–36.0)
WBC: 6.4 10*3/uL (ref 4.0–10.5)

## 2013-01-21 LAB — APTT: aPTT: 52 seconds — ABNORMAL HIGH (ref 24–37)

## 2013-01-21 LAB — TYPE AND SCREEN
ABO/RH(D): A POS
Antibody Screen: NEGATIVE

## 2013-01-21 MED ORDER — METOPROLOL TARTRATE 50 MG PO TABS
50.0000 mg | ORAL_TABLET | Freq: Two times a day (BID) | ORAL | Status: DC
  Administered 2013-01-21 – 2013-01-27 (×12): 50 mg via ORAL
  Filled 2013-01-21 (×13): qty 1

## 2013-01-21 MED ORDER — CALCIUM CARBONATE 1250 (500 CA) MG PO TABS
1.0000 | ORAL_TABLET | Freq: Every day | ORAL | Status: DC
  Administered 2013-01-22 – 2013-01-27 (×6): 500 mg via ORAL
  Filled 2013-01-21 (×6): qty 1

## 2013-01-21 MED ORDER — DABIGATRAN ETEXILATE MESYLATE 75 MG PO CAPS
75.0000 mg | ORAL_CAPSULE | Freq: Two times a day (BID) | ORAL | Status: DC
  Filled 2013-01-21: qty 1

## 2013-01-21 MED ORDER — SODIUM CHLORIDE 0.9 % IV SOLN
INTRAVENOUS | Status: DC
  Administered 2013-01-21 – 2013-01-24 (×5): via INTRAVENOUS

## 2013-01-21 MED ORDER — ALLOPURINOL 100 MG PO TABS
100.0000 mg | ORAL_TABLET | Freq: Every day | ORAL | Status: DC
  Administered 2013-01-21 – 2013-01-27 (×7): 100 mg via ORAL
  Filled 2013-01-21 (×7): qty 1

## 2013-01-21 MED ORDER — VITAMIN D3 25 MCG (1000 UNIT) PO TABS
2000.0000 [IU] | ORAL_TABLET | Freq: Every day | ORAL | Status: DC
  Administered 2013-01-22 – 2013-01-27 (×6): 2000 [IU] via ORAL
  Filled 2013-01-21 (×6): qty 2

## 2013-01-21 MED ORDER — GLIMEPIRIDE 2 MG PO TABS
2.0000 mg | ORAL_TABLET | Freq: Every evening | ORAL | Status: DC
  Administered 2013-01-21 – 2013-01-22 (×2): 2 mg via ORAL
  Filled 2013-01-21 (×3): qty 1

## 2013-01-21 MED ORDER — CALCIUM 500 MG PO TABS: 600.0000 mg | ORAL_TABLET | Freq: Every day | ORAL | Status: DC

## 2013-01-21 MED ORDER — PANTOPRAZOLE SODIUM 40 MG PO TBEC
40.0000 mg | DELAYED_RELEASE_TABLET | Freq: Every day | ORAL | Status: DC
  Administered 2013-01-22 – 2013-01-27 (×6): 40 mg via ORAL
  Filled 2013-01-21 (×6): qty 1

## 2013-01-21 MED ORDER — TRAMADOL HCL 50 MG PO TABS
50.0000 mg | ORAL_TABLET | Freq: Four times a day (QID) | ORAL | Status: DC | PRN
  Administered 2013-01-22 – 2013-01-24 (×2): 50 mg via ORAL
  Filled 2013-01-21 (×2): qty 1

## 2013-01-21 MED ORDER — IOHEXOL 300 MG/ML  SOLN
100.0000 mL | Freq: Once | INTRAMUSCULAR | Status: AC | PRN
  Administered 2013-01-21: 100 mL via INTRAVENOUS

## 2013-01-21 MED ORDER — ENOXAPARIN SODIUM 40 MG/0.4ML ~~LOC~~ SOLN
40.0000 mg | SUBCUTANEOUS | Status: DC
  Filled 2013-01-21: qty 0.4

## 2013-01-21 MED ORDER — CEFAZOLIN SODIUM 1-5 GM-% IV SOLN
1.0000 g | INTRAVENOUS | Status: AC
  Administered 2013-01-22: 1 g via INTRAVENOUS
  Filled 2013-01-21: qty 50

## 2013-01-21 MED ORDER — FUROSEMIDE 20 MG PO TABS
20.0000 mg | ORAL_TABLET | Freq: Every morning | ORAL | Status: DC
  Administered 2013-01-22 – 2013-01-27 (×6): 20 mg via ORAL
  Filled 2013-01-21 (×6): qty 1

## 2013-01-21 NOTE — H&P (Signed)
Referring Physician:Ennever HPI: [name] is an 72 y.o. female with prior history of lymphoma about 3 years ago. She now has new finding of RLQ intraabdominal mass. She underwent BM biopsy and then endoscopy with biopsy which now confirms diagnosis of Non Hodgkin's lymphoma.  She has been admitted to initiate chemotherapy and IR is requested to place portacath. She has had port in the past and subsequent removal a few years ago. PMHx and meds reviewed. Has been taking Pradaxa, last dose was at 7:00am today.   Past Medical History:  Past Medical History  Diagnosis Date  . Diabetes mellitus without complication   . Hypertension   . Anginal pain   . Headache   . Anxiety     h/o anxiety attacks  . Myocardial infarction 2011  . Cancer     lymphoma  . Arthritis     Left hip  . Anemia of renal disease 09/17/2012  . Bleeding     Past Surgical History:  Past Surgical History  Procedure Laterality Date  . Abdominal hysterectomy      30 yrs. ago  . Appendectomy  2009  . Tonsillectomy      age 73  . Cardiac catheterization  2011  . Rotator cuff repair      Right arm  . Esophagogastroduodenoscopy (egd) with propofol  10/06/2012    Procedure: ESOPHAGOGASTRODUODENOSCOPY (EGD) WITH PROPOFOL;  Surgeon: Willis Modena, MD;  Location: [facility];  Service: Endoscopy;  Laterality: N/A;  . Colonoscopy with propofol  10/06/2012    Procedure: COLONOSCOPY WITH PROPOFOL;  Surgeon: Willis Modena, MD;  Location: [facility];  Service: Endoscopy;  Laterality: N/A;  . Colonoscopy with propofol N/A 01/14/2013    Procedure: COLONOSCOPY WITH PROPOFOL;  Surgeon: Willis Modena, MD;  Location: [facility];  Service: Endoscopy;  Laterality: N/A;    Family History: History reviewed. No pertinent family history.  Social History:  reports that she has quit smoking. She has never used smokeless tobacco. She reports that she does not drink alcohol or use illicit drugs.  Allergies:  Allergies   Allergen Reactions  . Advicor (Niacin-Lovastatin Er)     Muscle cramps  . Crestor (Rosuvastatin)     Muscle cramps  . Lipitor (Atorvastatin)     Muscle cramps  . Lovastatin     Muscle cramps  . Morphine And Related Other (See Comments)    "made me crazy"  . Niaspan (Niacin Er)     Muscle cramps  . Nsaids     Gi side effects  . Pravachol (Pravastatin Sodium)     Gi side effects  . Sulfa Antibiotics Other (See Comments)    Can't remember   . Vytorin (Ezetimibe-Simvastatin) Nausea Only  . Welchol (Colesevelam Hcl)     Muscle cramps  . Zetia (Ezetimibe)     Muscle cramps   . Zocor (Simvastatin)     Muscle cramps     Medications: Aromatic Inhalants (VICKS VAPOINHALER) INHA (Taking) Sig - Route: Inhale 1 puff into the lungs daily as needed (dryness). - Inhalation Class: Historical Med Number of times this order has been changed since signing: 1 Order [address] aspirin EC 81 MG tablet (Taking) Sig - Route: Take 81 mg by mouth every morning. - Oral Class: Historical Med Number of times this order has been changed since signing: 3 Order [address] Calcium 500 MG tablet (Taking) Sig - Route: Take 600 mg by mouth daily. - Oral Class: Historical Med Number of times this order has  been changed since signing: 1 Order [address] cholecalciferol (VITAMIN D) 1000 UNITS tablet (Taking) Sig - Route: Take 2,000 Units by mouth daily. - Oral Class: Historical Med Number of times this order has been changed since signing: 2 Order [address] dabigatran (PRADAXA) 75 MG CAPS (Taking) 60 capsule 10/07/2012 Sig - Route: Take 1 capsule (75 mg total) by mouth 2 (two) times daily. - Oral Class: No Print Number of times this order has been changed since signing: 1 Order [address] Dextromethorphan HBr (CREOMULSION ADULT) 20 MG/15ML SYRP (Taking) Sig - Route: Take 30 mLs by mouth 3 (three) times daily as needed. For cough and congestion - Oral Class: Historical Med Number of times this order has been changed  since signing: 1 Order [address] esomeprazole (NEXIUM) 40 MG capsule (Taking) Sig - Route: Take 40 mg by mouth daily. - Oral Class: Historical Med Number of times this order has been changed since signing: 2 Order [address] fish oil-omega-3 fatty acids 1000 MG capsule (Taking) Sig - Route: Take 1 g by mouth daily. - Oral Class: Historical Med Number of times this order has been changed since signing: 1 Order [address] furosemide (LASIX) 40 MG tablet (Taking) Sig - Route: Take 20 mg by mouth every morning. - Oral Class: Historical Med Number of times this order has been changed since signing: 4 Order [address] glimepiride (AMARYL) 4 MG tablet (Taking) Sig - Route: Take 2 mg by mouth every evening. - Oral Class: Historical Med Number of times this order has been changed since signing: 4 Order [address] lisinopril (PRINIVIL,ZESTRIL) 20 MG tablet (Taking) Sig - Route: Take 10 mg by mouth every morning. - Oral Class: Historical Med Number of times this order has been changed since signing: 4 Order [address] metoprolol (LOPRESSOR) 50 MG tablet (Taking) Sig - Route: Take 50 mg by mouth 2 (two) times daily. - Oral Class: Historical Med Number of times this order has been changed since signing: 1 Order [address] Multiple Vitamin (MULTIVITAMIN WITH MINERALS) TABS (Taking) Sig - Route: Take 1 tablet by mouth daily. - Oral Class: Historical Med Number of times this order has been changed since signing: 1 Order [address] traMADol (ULTRAM) 50 MG tablet (Taking) Sig - Route: Take 50 mg by mouth every 6 (six) hours as needed. For pain. Maximum dose= 8 tablets per day - Oral Class: Historical Med Number of times this order has been changed since signing: 3 Order [address] vitamin C (ASCORBIC ACID) 500 MG tablet (Taking) Sig - Route: Take 500 mg by mouth daily   Please HPI for pertinent positives, otherwise complete 10 system ROS negative.  Physical Exam: Blood pressure 126/57, pulse 70, temperature 98.2 F  (36.8 C), temperature source Oral, resp. rate 16, height 5\' 2"  (1.575 m), weight 168 lb (76.204 kg), SpO2 100.00%. Body mass index is 30.72 kg/(m^2).   General Appearance:  Alert, cooperative, no distress, appears stated age  Head:  Normocephalic, without obvious abnormality, atraumatic  ENT: Unremarkable  Neck: Supple, symmetrical, trachea midline, no adenopathy, thyroid: not enlarged, symmetric, no tenderness/mass/nodules  Lungs:   Clear to auscultation bilaterally, no w/r/r, respirations unlabored without use of accessory muscles.  Chest Wall:  Old scar left chest wall from prior port.  Heart:  Regular rate and rhythm, S1, S2 normal, no murmur, rub or gallop.   Neurologic: Normal affect, no gross deficits.   Results for orders placed during the hospital encounter of 01/21/13 (from the past 48 hour(s))  APTT     Status: Abnormal   Collection Time    01/21/13 11:09 AM      Result Value Range   aPTT 52 (*) 24 - 37 seconds   Comment:            IF BASELINE aPTT IS ELEVATED,     SUGGEST PATIENT RISK ASSESSMENT     BE USED TO DETERMINE APPROPRIATE     ANTICOAGULANT THERAPY.  CBC     Status: Abnormal   Collection Time    01/21/13 11:09 AM      Result Value Range   WBC 6.4  4.0 - 10.5 K/uL   RBC 3.64 (*) 3.87 - 5.11 MIL/uL   Hemoglobin 9.9 (*) 12.0 - 15.0 g/dL   HCT 16.1 (*) 09.6 - 04.5 %   MCV 83.8  78.0 - 100.0 fL   MCH 27.2  26.0 - 34.0 pg   MCHC 32.5  30.0 - 36.0 g/dL   RDW 40.9 (*) 81.1 - 91.4 %   Platelets 355  150 - 400 K/uL  COMPREHENSIVE METABOLIC PANEL     Status: Abnormal   Collection Time    01/21/13 11:09 AM      Result Value Range   Sodium 138  135 - 145 mEq/L   Potassium 3.6  3.5 - 5.1 mEq/L   Chloride 97  96 - 112 mEq/L   CO2 30  19 - 32 mEq/L   Glucose, Bld 134 (*) 70 - 99 mg/dL   BUN 16  6 - 23 mg/dL   Creatinine, Ser 7.82  0.50 - 1.10 mg/dL   Calcium 8.9  8.4 - 95.6 mg/dL   Total Protein 6.0  6.0 - 8.3 g/dL   Albumin 2.4 (*) 3.5 - 5.2 g/dL   AST 14  0  - 37 U/L   ALT 8  0 - 35 U/L   Alkaline Phosphatase 69  39 - 117 U/L   Total Bilirubin 0.3  0.3 - 1.2 mg/dL   GFR calc non Af Amer 52 (*) >90 mL/min   GFR calc Af Amer 60 (*) >90 mL/min   Comment:            The eGFR has been calculated     using the CKD EPI equation.     This calculation has not been     validated in all clinical     situations.     eGFR's persistently     <90 mL/min signify     possible Chronic Kidney Disease.  MAGNESIUM     Status: None   Collection Time    01/21/13 11:09 AM      Result Value Range   Magnesium 1.5  1.5 - 2.5 mg/dL  PROTIME-INR     Status: Abnormal   Collection Time    01/21/13 11:09 AM      Result Value Range   Prothrombin Time 15.8 (*) 11.6 - 15.2 seconds   INR 1.29  0.00 - 1.49  TYPE AND SCREEN     Status: None   Collection Time    01/21/13 11:09 AM      Result Value Range   ABO/RH(D) A POS     Antibody Screen NEG     Sample Expiration 01/24/2013     X-ray Chest Pa And Lateral   01/21/2013  *RADIOLOGY REPORT*  Clinical Data: Hypertension, diabetes, lymphoma  CHEST - 2 VIEW  Comparison: PET CT dated 12/26/2012.  Chest radiographs dated 05/11/2011.  Findings:  Lungs are clear. No pleural effusion or pneumothorax.  Cardiomediastinal silhouette is within normal limits.  Mild degenerative changes of the visualized thoracolumbar spine.  IMPRESSION: No evidence of acute cardiopulmonary disease.   Original Report Authenticated By: Charline Bills, M.D.     Assessment/Plan: Non-Hodgkin's Lymphoma For Port placement Discussed procedure, risks, complications, use of sedation By tomorrow morning, Pradaxa will have been held an appropriate amount of time for procedure. Consent signed in chart  Brayton El PA-C 01/21/2013, 4:21 PM

## 2013-01-21 NOTE — H&P (Signed)
#   [id number] is dictated admit note.  Cindee Lame

## 2013-01-21 NOTE — Progress Notes (Signed)
  Echocardiogram 2D Echocardiogram has been performed.  Cathie Beams 01/21/2013, 2:16 PM

## 2013-01-22 ENCOUNTER — Inpatient Hospital Stay (HOSPITAL_COMMUNITY): Payer: Medicare Other

## 2013-01-22 HISTORY — PX: PORTACATH PLACEMENT: SHX2246

## 2013-01-22 MED ORDER — LIDOCAINE HCL 1 % IJ SOLN
INTRAMUSCULAR | Status: AC
  Filled 2013-01-22: qty 20

## 2013-01-22 MED ORDER — CEFAZOLIN SODIUM 1-5 GM-% IV SOLN
INTRAVENOUS | Status: AC
  Filled 2013-01-22: qty 50

## 2013-01-22 MED ORDER — MIDAZOLAM HCL 2 MG/2ML IJ SOLN
INTRAMUSCULAR | Status: AC | PRN
  Administered 2013-01-22 (×3): 0.5 mg via INTRAVENOUS

## 2013-01-22 MED ORDER — FENTANYL CITRATE 0.05 MG/ML IJ SOLN
INTRAMUSCULAR | Status: AC
  Filled 2013-01-22: qty 6

## 2013-01-22 MED ORDER — DEXTROSE 50 % IV SOLN
INTRAVENOUS | Status: AC
  Filled 2013-01-22: qty 50

## 2013-01-22 MED ORDER — MIDAZOLAM HCL 2 MG/2ML IJ SOLN
INTRAMUSCULAR | Status: AC
  Filled 2013-01-22: qty 6

## 2013-01-22 MED ORDER — DEXTROSE 50 % IV SOLN
25.0000 mL | Freq: Once | INTRAVENOUS | Status: AC | PRN
  Administered 2013-01-22: 25 mL via INTRAVENOUS

## 2013-01-22 MED ORDER — DOCUSATE SODIUM 100 MG PO CAPS
100.0000 mg | ORAL_CAPSULE | Freq: Every day | ORAL | Status: DC | PRN
  Administered 2013-01-22: 100 mg via ORAL
  Filled 2013-01-22 (×2): qty 1

## 2013-01-22 MED ORDER — FENTANYL CITRATE 0.05 MG/ML IJ SOLN
INTRAMUSCULAR | Status: AC | PRN
  Administered 2013-01-22: 50 ug via INTRAVENOUS

## 2013-01-22 NOTE — Progress Notes (Signed)
[name] is doing well this morning. She did have her CT scan of the face yesterday. 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 There is nothing in your in itself.  She is going for her Port-A-Cath today. Her port accessed has been stopped.  I do have her on allopurinol now.  She is n.p.o. so she is a little bit hungry.  On her physical exam her vital signs are temperature 98.5 pulse 64 start a 60 blood pressure 164/59 her head exam shows be slight swelling in the right supraorbital region. There is some some firmness in this region. She has good extraocular muscle movement. Is a slight fullness in the right cervical region just under the parotid gland. Lungs are clear bilateral. Cardiac exam regular rate and rhythm with no murmurs rubs or bruits. Abdominal exam soft good bowel sounds. There is a palpable abdominal mass. Is no palpable hepatospleno megaly extremities shows no clubbing cyanosis or edema.  We will start her chemotherapy I do know what her Port-A-Cath is put in. I suspect that the chemotherapy likely will start tomorrow.  With this right supraorbital involvement, we should be oh to see this resolve with chemotherapy which should give [place] a good indicator as to response.     Angelica Ran 4:10

## 2013-01-22 NOTE — Procedures (Signed)
Procedure:  Porta-cath Access:  Right IJ vein SL PAC placed with cath tip at cavoatrial junction.  Accessed and OK to use.  No PTX.

## 2013-01-22 NOTE — Care Management Note (Signed)
Ur complete.  

## 2013-01-22 NOTE — H&P (Signed)
[name], [name] NO.:  [id number]  MEDICAL RECORD NO.:  [id number]  LOCATION:  1315                         FACILITY:  [facility]  PHYSICIAN:  Josph Macho, M.D.  DATE OF BIRTH:  January 07, 1941  DATE OF ADMISSION:  01/21/2013 DATE OF DISCHARGE:                             HISTORY & PHYSICAL   REASON FOR ADMISSION: 1. Salvage chemotherapy for relapsed non-Hodgkin's lymphoma. 2. Non-insulin-dependent diabetes. 3. 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  HISTORY PRESENT ILLNESS:  [name] is a very nice 72 year old white female.  She initially present with large cell lymphoma back in February 2011.  She was treated with 8 cycles of R-CHOP.  She tolerated this very well.  She got into remission.  Recently, she was found to have abdominal pain.  She had a CT scan, which showed thickening of the intestine.  She ultimately underwent a colonoscopy.  Biopsies were done.  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  She has been having problems with progressive anemia.  We did go ahead and repeat a bone marrow biopsy on her. Surprisingly enough, the bone marrow biopsy did not show any evidence of lymphoma.  We planned to transfuse her to help maintain her blood counts.  She now is being admitted, so that we can start her on salvage chemotherapy R-CHOP.  The patient started to have some night sweats. There has been no fever.  Her appetite has been okay.  There is no weight loss.  There has been no obvious bleeding.  There is no cough.  PAST MEDICAL HISTORY: Remarkable for; 1. Hypertension. 2. Non-insulin diabetes. 3. Atrial fibrillation. 4. Arthritis. 5. History of myocardial infarction.  ALLERGIES: 1. Statin drugs. 2. Lipitor. 3. Morphine. 4. NSAID. 5. Sulfa. 6. Zetia. 7. Welchol. 8. Vytorin.  MEDICATIONS:  On admission; aspirin 81 mg p.o. daily, calcium 500 mg p.o. daily, vitamin D 2000 units p.o. daily, Pradaxa 75 mg p.o.  b.i.d., Nexium 40 mg p.o. daily, Lasix 20 mg p.o. daily , Amaryl 2 mg p.o. daily, lisinopril 20 mg p.o. daily, metoprolol 50 mg p.o. b.i.d., Ultram 50 mg p.o. q.4-6 hours p.r.n., and vitamin C 500 mg p.o. daily.  SOCIAL HISTORY:  Negative for tobacco or alcohol use.  PHYSICAL EXAMINATION:  GENERAL:  This is a well-developed, well- nourished white female, in no obvious distress. VITAL SIGNS:  Temperature of 98.2, pulse 70, respiratory 16, blood pressure 126/57. HEAD AND NECK:  Shows some slight swelling over the right eye.  This is in the supraorbital region.  It is not red.  It is slightly tender.  She has good extraocular muscle movement.  There is no intraoral lesions. There is some slight fullness in the upper right cervical lymph chain. Thyroid is not palpable. LUNGS:  Clear bilaterally. CARDIAC:  Regular rate and rhythm with a normal S1 and S2.  She has no murmurs, rubs, or bruits. ABDOMEN:  Soft.  There is some slight tenderness in the right side.  No fullness is noted.  There is no fluid wave.  There is no palpable hepatosplenomegaly.  Her bowel sounds are active. EXTREMITIES:  Show some trace edema in her lower legs. SKIN:  No rashes, ecchymoses,  or petechia.  LABORATORY STUDIES:  White cell count is 6.4, hemoglobin 10, hematocrit 30.5, platelet count 355.  Sodium 136, potassium 3.6, BUN 16, creatinine 1.06.  Calcium 8.9 with an albumin of 2.4. INR is 1.29.  Chest x-ray shows no acute cardiopulmonary disease.  EKG shows normal sinus rhythm.  IMPRESSION:  [name] is a 72 year old white female with relapsed non- Hodgkin's lymphoma.  She has diffuse large-cell lymphoma.  I am going to have to get a Port-A-Cath into her.  This will be done tomorrow.  We will hold her Pradaxa until the Port-A-Cath is placed.  We will have to reduce her chemotherapy by 20%.  Again, she has some myelodysplastic issues with her marrow and I do not want to her become profoundly  pancytopenic.  We will start her on some IV fluids.  I will start her on some allopurinol.  We will have to watch her blood counts closely.     Josph Macho, M.D.     PRE/MEDQ  D:  01/21/2013  T:  01/22/2013  Job:  [id number]

## 2013-01-22 NOTE — Progress Notes (Signed)
Inpatient Diabetes Program Recommendations  AACE/ADA: New Consensus Statement on Inpatient Glycemic Control (2013)  Target Ranges:  Prepandial:   less than 140 mg/dL      Peak postprandial:   less than 180 mg/dL (1-2 hours)      Critically ill patients:  140 - 180 mg/dL   Reason for Visit: Results for [name], [name] (MRN [id number]) as of 01/22/2013 09:20  Ref. Range 01/22/2013 07:51 01/22/2013 08:41  Glucose-Capillary Latest Range: 70-99 mg/dL 60 (L) 621 (H)   Note CBG less than 70 mg/dL this morning.  Consider holding glimepiride (Amaryl) while patient is in the hospital.  If CBG's greater than 140 mg/dL, consider adding Novolog correction tid with meals and HS.  Please check A1C to determine prehospitalization glycemic control.

## 2013-01-23 LAB — CBC WITH DIFFERENTIAL/PLATELET
Eosinophils Relative: 6 % — ABNORMAL HIGH (ref 0–5)
HCT: 26.6 % — ABNORMAL LOW (ref 36.0–46.0)
Lymphocytes Relative: 43 % (ref 12–46)
Lymphs Abs: 1.9 10*3/uL (ref 0.7–4.0)
MCV: 84.2 fL (ref 78.0–100.0)
Monocytes Absolute: 0.6 10*3/uL (ref 0.1–1.0)
Neutro Abs: 1.8 10*3/uL (ref 1.7–7.7)
RBC: 3.16 MIL/uL — ABNORMAL LOW (ref 3.87–5.11)
WBC: 4.6 10*3/uL (ref 4.0–10.5)

## 2013-01-23 LAB — COMPREHENSIVE METABOLIC PANEL
ALT: 7 U/L (ref 0–35)
AST: 19 U/L (ref 0–37)
CO2: 28 mEq/L (ref 19–32)
Calcium: 8.4 mg/dL (ref 8.4–10.5)
Chloride: 100 mEq/L (ref 96–112)
Creatinine, Ser: 0.94 mg/dL (ref 0.50–1.10)
GFR calc Af Amer: 70 mL/min — ABNORMAL LOW (ref >90)
GFR calc non Af Amer: 60 mL/min — ABNORMAL LOW (ref >90)
Glucose, Bld: 109 mg/dL — ABNORMAL HIGH (ref 70–99)
Sodium: 138 mEq/L (ref 135–145)
Total Bilirubin: 0.3 mg/dL (ref 0.3–1.2)

## 2013-01-23 LAB — GLUCOSE, CAPILLARY

## 2013-01-23 MED ORDER — HEPARIN SOD (PORK) LOCK FLUSH 100 UNIT/ML IV SOLN: 500.0000 [IU] | Freq: Once | INTRAVENOUS | Status: AC | PRN

## 2013-01-23 MED ORDER — ALTEPLASE 2 MG IJ SOLR
2.0000 mg | Freq: Once | INTRAMUSCULAR | Status: AC | PRN
  Filled 2013-01-23: qty 2

## 2013-01-23 MED ORDER — LORAZEPAM 2 MG/ML IJ SOLN
1.0000 mg | Freq: Four times a day (QID) | INTRAMUSCULAR | Status: DC | PRN
  Administered 2013-01-26: 1 mg via INTRAVENOUS
  Filled 2013-01-23 (×2): qty 1

## 2013-01-23 MED ORDER — PROCHLORPERAZINE EDISYLATE 5 MG/ML IJ SOLN: 10.0000 mg | Freq: Four times a day (QID) | INTRAMUSCULAR | Status: DC | PRN

## 2013-01-23 MED ORDER — SODIUM CHLORIDE 0.9 % IV SOLN: INTRAVENOUS | Status: DC

## 2013-01-23 MED ORDER — SODIUM CHLORIDE 0.9 % IJ SOLN: 3.0000 mL | INTRAMUSCULAR | Status: DC | PRN

## 2013-01-23 MED ORDER — ACETAMINOPHEN 325 MG PO TABS
650.0000 mg | ORAL_TABLET | Freq: Once | ORAL | Status: AC
  Administered 2013-01-23: 650 mg via ORAL
  Filled 2013-01-23: qty 2

## 2013-01-23 MED ORDER — PROCHLORPERAZINE MALEATE 10 MG PO TABS
10.0000 mg | ORAL_TABLET | Freq: Four times a day (QID) | ORAL | Status: DC | PRN
  Administered 2013-01-25: 10 mg via ORAL
  Filled 2013-01-23: qty 1

## 2013-01-23 MED ORDER — DIPHENHYDRAMINE HCL 50 MG PO CAPS
50.0000 mg | ORAL_CAPSULE | Freq: Once | ORAL | Status: AC
  Administered 2013-01-23: 50 mg via ORAL
  Filled 2013-01-23: qty 1

## 2013-01-23 MED ORDER — SODIUM CHLORIDE 0.9 % IV SOLN
Freq: Once | INTRAVENOUS | Status: AC
  Administered 2013-01-23: 16 mg via INTRAVENOUS
  Filled 2013-01-23 (×2): qty 8

## 2013-01-23 MED ORDER — HOT PACK MISC ONCOLOGY
1.0000 | Freq: Once | Status: AC | PRN
  Filled 2013-01-23: qty 1

## 2013-01-23 MED ORDER — SODIUM CHLORIDE 0.9 % IV SOLN
80.0000 mg/m2 | Freq: Once | INTRAVENOUS | Status: AC
  Administered 2013-01-23: 150 mg via INTRAVENOUS
  Filled 2013-01-23 (×2): qty 7.5

## 2013-01-23 MED ORDER — HEPARIN SOD (PORK) LOCK FLUSH 100 UNIT/ML IV SOLN: 250.0000 [IU] | Freq: Once | INTRAVENOUS | Status: AC | PRN

## 2013-01-23 MED ORDER — SODIUM CHLORIDE 0.9 % IV SOLN
375.0000 mg/m2 | Freq: Once | INTRAVENOUS | Status: AC
  Administered 2013-01-23: 700 mg via INTRAVENOUS
  Filled 2013-01-23: qty 70

## 2013-01-23 MED ORDER — GLIMEPIRIDE 1 MG PO TABS
1.0000 mg | ORAL_TABLET | Freq: Every evening | ORAL | Status: DC
  Administered 2013-01-23: 1 mg via ORAL
  Filled 2013-01-23 (×2): qty 1

## 2013-01-23 MED ORDER — LORAZEPAM 1 MG PO TABS
1.0000 mg | ORAL_TABLET | Freq: Four times a day (QID) | ORAL | Status: DC | PRN
  Administered 2013-01-27: 1 mg via ORAL
  Filled 2013-01-23: qty 1

## 2013-01-23 MED ORDER — DABIGATRAN ETEXILATE MESYLATE 75 MG PO CAPS
75.0000 mg | ORAL_CAPSULE | Freq: Two times a day (BID) | ORAL | Status: DC
  Administered 2013-01-23 – 2013-01-27 (×9): 75 mg via ORAL
  Filled 2013-01-23 (×10): qty 1

## 2013-01-23 MED ORDER — SODIUM CHLORIDE 0.9 % IJ SOLN: 10.0000 mL | INTRAMUSCULAR | Status: DC | PRN

## 2013-01-23 NOTE — Progress Notes (Signed)
Patient and daughter given chemo teaching. Verbalized understanding, patient had previous chemotherapy.TOlerated rituxan and etoposide well today.Portacath with very good blood return, vital signs monitored.remained stable.

## 2013-01-23 NOTE — Progress Notes (Signed)
[name] had her Port-A-Cath placed yesterday. It is a little sore today.  She is now ready for chemotherapy. She'll start this today. I did it does reduce the chemotherapy a little bit.  Her blood counts today look pretty good. We will have to be careful with respect to her being anemic. I suspect that she may need to be transfused at some point in the near future. We may do this before she gets discharged which probably will be either Monday or Tuesday.  Her appetite is okay. There really is no abdominal pain. She is a little constipated. She did take a stool softener last night.  She's had no cough. There's no fever.  Her physical exam shows stable vital signs. Temperature 90.9 pulse is far spot rate 18 blood pressure 150/60. Lungs are clear bilaterally. Cardiac exam regular rate and rhythm with no murmurs rubs or bruits. Abdominal exam soft good bowel sounds. There is no palpable abdominal mass. Is no fluid wave. There is no palpable hepatospleno megaly extremities shows no clubbing cyanosis or edema. Skin exam no rashes.  We will get her Pradaxa restarted.  She will receive chemotherapy over the weekend. We will recheck her blood counts probably on Sunday.  [address] 17:14

## 2013-01-24 DIAGNOSIS — R195 Other fecal abnormalities: Secondary | ICD-10-CM

## 2013-01-24 LAB — GLUCOSE, CAPILLARY: Glucose-Capillary: 271 mg/dL — ABNORMAL HIGH (ref 70–99)

## 2013-01-24 MED ORDER — SODIUM CHLORIDE 0.9 % IV SOLN
80.0000 mg/m2 | INTRAVENOUS | Status: AC
  Administered 2013-01-24 – 2013-01-25 (×2): 150 mg via INTRAVENOUS
  Filled 2013-01-24 (×2): qty 7.5

## 2013-01-24 MED ORDER — INSULIN ASPART 100 UNIT/ML ~~LOC~~ SOLN
10.0000 [IU] | Freq: Once | SUBCUTANEOUS | Status: AC
  Administered 2013-01-24: 10 [IU] via SUBCUTANEOUS

## 2013-01-24 MED ORDER — BIOTENE DRY MOUTH MT LIQD
15.0000 mL | Freq: Four times a day (QID) | OROMUCOSAL | Status: DC
  Administered 2013-01-24 – 2013-01-27 (×11): 15 mL via OROMUCOSAL

## 2013-01-24 MED ORDER — SODIUM CHLORIDE 0.9 % IV SOLN
460.0000 mg | Freq: Once | INTRAVENOUS | Status: AC
  Administered 2013-01-24: 460 mg via INTRAVENOUS
  Filled 2013-01-24: qty 46

## 2013-01-24 MED ORDER — SODIUM CHLORIDE 0.9 % IV SOLN
Freq: Once | INTRAVENOUS | Status: AC
  Administered 2013-01-24: 18:00:00 via INTRAVENOUS
  Filled 2013-01-24: qty 146

## 2013-01-24 MED ORDER — SODIUM BICARBONATE/SODIUM CHLORIDE MOUTHWASH
Freq: Four times a day (QID) | OROMUCOSAL | Status: DC
  Administered 2013-01-24 – 2013-01-26 (×9): via OROMUCOSAL
  Filled 2013-01-24: qty 1000

## 2013-01-24 MED ORDER — GLIMEPIRIDE 4 MG PO TABS
4.0000 mg | ORAL_TABLET | Freq: Every evening | ORAL | Status: DC
  Administered 2013-01-24 – 2013-01-26 (×3): 4 mg via ORAL
  Filled 2013-01-24 (×4): qty 1

## 2013-01-24 MED ORDER — SODIUM CHLORIDE 0.9 % IV SOLN
INTRAVENOUS | Status: AC
  Administered 2013-01-24 – 2013-01-25 (×2): 16 mg via INTRAVENOUS
  Filled 2013-01-24 (×2): qty 8

## 2013-01-24 NOTE — Progress Notes (Signed)
Dr Myna Hidalgo called concerning CBG of 271, order given for insulin 10 units once, no changes in frequency of CBGs. Will continue to monitor.

## 2013-01-24 NOTE — Progress Notes (Signed)
Mrs. Beeck did well with 1st day of chemo.  She did have an episode of confusion this morning.  She states that her stool is "dark."  We will check for any bleeding.  Appetite is good.  N0 n/v.  (-) Abdominal pain.    Is OOB w/o difficulty.   VSS are ok.  BP is 168/72.  HR is 66.  ABD:  Soft.  (-) Tender.  Good BS.  (-) masses. LUNG:  Clear. COR: RRR.  WIll proceed with d#2 of chemo. Check stool for bloood.  If (+),  Will need GI to see for scope. Am checking CBC in the AM.  Pete E.  Psalm 103:3

## 2013-01-25 ENCOUNTER — Inpatient Hospital Stay (HOSPITAL_COMMUNITY): Payer: Medicare Other

## 2013-01-25 DIAGNOSIS — R110 Nausea: Secondary | ICD-10-CM

## 2013-01-25 DIAGNOSIS — K5900 Constipation, unspecified: Secondary | ICD-10-CM

## 2013-01-25 LAB — CBC WITH DIFFERENTIAL/PLATELET
Basophils Relative: 0 % (ref 0–1)
HCT: 24.8 % — ABNORMAL LOW (ref 36.0–46.0)
Hemoglobin: 7.9 g/dL — ABNORMAL LOW (ref 12.0–15.0)
MCH: 26.7 pg (ref 26.0–34.0)
MCHC: 31.9 g/dL (ref 30.0–36.0)
MCV: 83.8 fL (ref 78.0–100.0)
Monocytes Absolute: 0.2 10*3/uL (ref 0.1–1.0)
Monocytes Relative: 3 % (ref 3–12)
Neutro Abs: 5.9 10*3/uL (ref 1.7–7.7)

## 2013-01-25 LAB — COMPREHENSIVE METABOLIC PANEL
Albumin: 2 g/dL — ABNORMAL LOW (ref 3.5–5.2)
BUN: 15 mg/dL (ref 6–23)
CO2: 22 mEq/L (ref 19–32)
Chloride: 105 mEq/L (ref 96–112)
Creatinine, Ser: 0.78 mg/dL (ref 0.50–1.10)
GFR calc non Af Amer: 83 mL/min — ABNORMAL LOW (ref >90)
Total Bilirubin: 0.2 mg/dL — ABNORMAL LOW (ref 0.3–1.2)

## 2013-01-25 LAB — OCCULT BLOOD X 1 CARD TO LAB, STOOL: Fecal Occult Bld: NEGATIVE

## 2013-01-25 LAB — GLUCOSE, CAPILLARY: Glucose-Capillary: 188 mg/dL — ABNORMAL HIGH (ref 70–99)

## 2013-01-25 MED ORDER — CLONIDINE HCL 0.1 MG PO TABS
0.1000 mg | ORAL_TABLET | Freq: Once | ORAL | Status: AC
  Administered 2013-01-25: 0.1 mg via ORAL
  Filled 2013-01-25: qty 1

## 2013-01-25 MED ORDER — LABETALOL HCL 5 MG/ML IV SOLN
10.0000 mg | Freq: Once | INTRAVENOUS | Status: AC
  Administered 2013-01-25: 10 mg via INTRAVENOUS
  Filled 2013-01-25: qty 4

## 2013-01-25 MED ORDER — ALBUTEROL SULFATE (5 MG/ML) 0.5% IN NEBU
2.5000 mg | INHALATION_SOLUTION | Freq: Once | RESPIRATORY_TRACT | Status: AC
  Administered 2013-01-25: 2.5 mg via RESPIRATORY_TRACT

## 2013-01-25 MED ORDER — DOCUSATE SODIUM 100 MG PO CAPS
200.0000 mg | ORAL_CAPSULE | Freq: Two times a day (BID) | ORAL | Status: DC
  Administered 2013-01-25 – 2013-01-27 (×4): 200 mg via ORAL
  Filled 2013-01-25 (×5): qty 2

## 2013-01-25 MED ORDER — ALBUTEROL SULFATE (5 MG/ML) 0.5% IN NEBU
INHALATION_SOLUTION | RESPIRATORY_TRACT | Status: AC
  Filled 2013-01-25: qty 0.5

## 2013-01-25 MED ORDER — LACTULOSE 10 GM/15ML PO SOLN
20.0000 g | Freq: Three times a day (TID) | ORAL | Status: DC
  Administered 2013-01-25 – 2013-01-26 (×3): 20 g via ORAL
  Filled 2013-01-25 (×8): qty 30

## 2013-01-25 MED ORDER — FUROSEMIDE 10 MG/ML IJ SOLN
20.0000 mg | Freq: Once | INTRAMUSCULAR | Status: AC
  Administered 2013-01-25: 20 mg via INTRAVENOUS
  Filled 2013-01-25: qty 2

## 2013-01-25 MED ORDER — LABETALOL HCL 5 MG/ML IV SOLN
10.0000 mg | INTRAVENOUS | Status: DC | PRN
  Administered 2013-01-25: 10 mg via INTRAVENOUS
  Filled 2013-01-25: qty 4

## 2013-01-25 NOTE — Progress Notes (Signed)
RN called from 3 east to come to patients room.  When I arrived pt was having shortness of breath. Breath sounds were diminished and she had expiratory wheezes. Rapid response was called Did an proticiol assessment and ordered her a one time albuterol treatment.Pt tolerated well. Will continue to monitor.

## 2013-01-25 NOTE — Progress Notes (Signed)
Dr Clelia Croft called concerning b/p of 196/96, new orders given.

## 2013-01-25 NOTE — Progress Notes (Signed)
Mrs. Peace is doing okay although she is constipated. She is a little nauseated. She will finish up her chemotherapy tonight. She's done quite well with the so far.  She is to have blood work done yet today. I ordered this 2 days ago. I'm sure that somehow the order never went through. We have to reorder labs for her.  Her glucose has been running on the higher side. We did increase Amaryl dose. This is no make too much.  There is no further episodes of confusion.  On her physical exam all  of her vital signs are stable. Blood pressure 137/77. Heart rate 69. Lungs are clear bilaterally. Cardiac exam regular rate and rhythm with no murmurs rubs or bruits. Abdominal exam soft. Bowel sounds slightly decreased. There is no fluid wave. There is no palpable pedal spinal megaly. Extremities shows no clubbing cyanosis or edema.  She still has decent urine output.  Again we will need to repeat her lab work.  Hopefully, she might feel to go home tomorrow. We will have to watch out for any delayed effects from the ifosfamide.   Pete E.  Ephesians 6:10

## 2013-01-26 DIAGNOSIS — J811 Chronic pulmonary edema: Secondary | ICD-10-CM

## 2013-01-26 DIAGNOSIS — D696 Thrombocytopenia, unspecified: Secondary | ICD-10-CM

## 2013-01-26 LAB — COMPREHENSIVE METABOLIC PANEL
AST: 21 U/L (ref 0–37)
Albumin: 2.1 g/dL — ABNORMAL LOW (ref 3.5–5.2)
Alkaline Phosphatase: 48 U/L (ref 39–117)
Chloride: 103 mEq/L (ref 96–112)
Potassium: 3.5 mEq/L (ref 3.5–5.1)
Sodium: 139 mEq/L (ref 135–145)
Total Bilirubin: 0.1 mg/dL — ABNORMAL LOW (ref 0.3–1.2)

## 2013-01-26 LAB — CBC
Platelets: 283 10*3/uL (ref 150–400)
RDW: 16.9 % — ABNORMAL HIGH (ref 11.5–15.5)
WBC: 4.9 10*3/uL (ref 4.0–10.5)

## 2013-01-26 MED ORDER — ACETAMINOPHEN 325 MG PO TABS
650.0000 mg | ORAL_TABLET | Freq: Once | ORAL | Status: AC
Start: 2013-01-26 — End: 2013-01-26
  Administered 2013-01-26: 650 mg via ORAL
  Filled 2013-01-26: qty 2

## 2013-01-26 MED ORDER — FUROSEMIDE 10 MG/ML IJ SOLN
30.0000 mg | Freq: Once | INTRAMUSCULAR | Status: AC
  Administered 2013-01-26: 30 mg via INTRAVENOUS
  Filled 2013-01-26: qty 3

## 2013-01-26 MED ORDER — LISINOPRIL 10 MG PO TABS
10.0000 mg | ORAL_TABLET | Freq: Every day | ORAL | Status: DC
  Administered 2013-01-26 – 2013-01-27 (×2): 10 mg via ORAL
  Filled 2013-01-26 (×2): qty 1

## 2013-01-26 MED ORDER — ONDANSETRON 8 MG/NS 50 ML IVPB
8.0000 mg | Freq: Three times a day (TID) | INTRAVENOUS | Status: DC
  Administered 2013-01-26 – 2013-01-27 (×3): 8 mg via INTRAVENOUS
  Filled 2013-01-26 (×4): qty 8

## 2013-01-26 MED ORDER — LEVOFLOXACIN 500 MG PO TABS
500.0000 mg | ORAL_TABLET | Freq: Every day | ORAL | Status: DC
  Administered 2013-01-26 – 2013-01-27 (×2): 500 mg via ORAL
  Filled 2013-01-26 (×2): qty 1

## 2013-01-26 MED ORDER — POTASSIUM CHLORIDE 10 MEQ/50ML IV SOLN
10.0000 meq | INTRAVENOUS | Status: AC
  Administered 2013-01-26 (×3): 10 meq via INTRAVENOUS
  Filled 2013-01-26 (×3): qty 50

## 2013-01-26 MED ORDER — LORAZEPAM 2 MG/ML IJ SOLN
1.0000 mg | Freq: Once | INTRAMUSCULAR | Status: AC
  Administered 2013-01-26: 1 mg via INTRAVENOUS

## 2013-01-26 NOTE — Progress Notes (Signed)
Mrs. Jaspers had a tough time last night. Apparently she developed some slight pulmonary edema and blood pressure elevation. I she was given some Lasix. Chest x-ray showed some a pulmonary edema.  She feels a little bit better this morning. She does have a little nausea. I will go a dose of Zofran and one dose of Ativan.  She's had no bleeding. There is no abdominal pain. She finally had a bowel movement. The stool was negative for blood.  The swelling above her right eye is or he going down. This should be indication that the chemotherapy is beginning to work.  Her vital signs are all stable. Blood pressure ninety-seven over four pulse 62 respiratory 21 blood pressure is 158/64. I may have to restart her  ACE inhibitor as I discontinued this when she came in but she may need this.  Lungs are clear bilaterally. May be some slight wheezing. She has good air movement by laterally. Cardiac exam regular rate and rhythm with an occasional extra beat. There are no murmurs rubs or bruits. Abdominal exam soft. She has good decent bowel sounds. There is no fluid wave. There is no palpable hepatospleno megaly extremities shows no clubbing cyanosis or edema. Neurological exam shows no focal neurological deficits.   Potassium 3.5 and BUN 16 creatinine 0.93. Glucose 224. White cell count 4.9. Platelets 283.  Again, we will transfuse her today. We will give her 2 units of blood. I will give her the a good amount of diuretic in between units. We will cut back her IV fluid rate.  I will also start her on Levaquin I she will need antibiotic prophylaxis. Once she is now patient, we will give her new last.  We will also give her Zofran on a schedule for nausea.  Maybe, she'll be ready to go home tomorrow.  Pete E.  Hebrews 12:12

## 2013-01-27 ENCOUNTER — Other Ambulatory Visit: Payer: Self-pay | Admitting: Hematology & Oncology

## 2013-01-27 ENCOUNTER — Telehealth: Payer: Self-pay | Admitting: Hematology & Oncology

## 2013-01-27 DIAGNOSIS — C8330 Diffuse large B-cell lymphoma, unspecified site: Secondary | ICD-10-CM

## 2013-01-27 DIAGNOSIS — I10 Essential (primary) hypertension: Secondary | ICD-10-CM

## 2013-01-27 DIAGNOSIS — R112 Nausea with vomiting, unspecified: Secondary | ICD-10-CM

## 2013-01-27 DIAGNOSIS — R7309 Other abnormal glucose: Secondary | ICD-10-CM

## 2013-01-27 LAB — TYPE AND SCREEN: Unit division: 0

## 2013-01-27 LAB — BASIC METABOLIC PANEL
Chloride: 103 mEq/L (ref 96–112)
Creatinine, Ser: 1.33 mg/dL — ABNORMAL HIGH (ref 0.50–1.10)
GFR calc Af Amer: 46 mL/min — ABNORMAL LOW (ref >90)
GFR calc non Af Amer: 39 mL/min — ABNORMAL LOW (ref >90)
Potassium: 3 mEq/L — ABNORMAL LOW (ref 3.5–5.1)

## 2013-01-27 MED ORDER — ALLOPURINOL 100 MG PO TABS: 100.0000 mg | ORAL_TABLET | Freq: Every day | ORAL | Status: DC

## 2013-01-27 MED ORDER — PROCHLORPERAZINE MALEATE 10 MG PO TABS: 10.0000 mg | ORAL_TABLET | Freq: Four times a day (QID) | ORAL | Status: DC | PRN

## 2013-01-27 MED ORDER — FILGRASTIM 480 MCG/1.6ML IJ SOLN
480.0000 ug | Freq: Once | INTRAMUSCULAR | Status: AC
  Administered 2013-01-27: 480 ug via SUBCUTANEOUS
  Filled 2013-01-27: qty 1.6

## 2013-01-27 MED ORDER — HEPARIN SOD (PORK) LOCK FLUSH 100 UNIT/ML IV SOLN
INTRAVENOUS | Status: AC
  Administered 2013-01-27: 500 [IU]
  Filled 2013-01-27: qty 5

## 2013-01-27 MED ORDER — PROCHLORPERAZINE MALEATE 10 MG PO TABS
10.0000 mg | ORAL_TABLET | Freq: Four times a day (QID) | ORAL | Status: DC | PRN
Start: 2013-01-27 — End: 2013-01-27

## 2013-01-27 MED ORDER — ONDANSETRON 8 MG/NS 50 ML IVPB
8.0000 mg | Freq: Once | INTRAVENOUS | Status: AC
  Administered 2013-01-27: 8 mg via INTRAVENOUS
  Filled 2013-01-27: qty 8

## 2013-01-27 MED ORDER — LEVOFLOXACIN 500 MG PO TABS: 500.0000 mg | ORAL_TABLET | Freq: Every day | ORAL | Status: DC

## 2013-01-27 MED ORDER — SODIUM BICARBONATE/SODIUM CHLORIDE MOUTHWASH: OROMUCOSAL | Status: DC

## 2013-01-27 MED ORDER — DSS 100 MG PO CAPS: 200.0000 mg | ORAL_CAPSULE | Freq: Three times a day (TID) | ORAL | Status: DC | PRN

## 2013-01-27 MED ORDER — BIOTENE DRY MOUTH MT LIQD: 15.0000 mL | Freq: Four times a day (QID) | OROMUCOSAL | Status: DC

## 2013-01-27 MED ORDER — LORAZEPAM 1 MG PO TABS: 1.0000 mg | ORAL_TABLET | Freq: Four times a day (QID) | ORAL | Status: DC | PRN

## 2013-01-27 NOTE — Telephone Encounter (Signed)
Per order to sch 01/28/13 lab/inj and 1 week apt for lab/Tracy.  MD wanted patient to be called with apts.  i called and gave apts to husband.  He stated they will be here.

## 2013-01-28 ENCOUNTER — Other Ambulatory Visit (HOSPITAL_BASED_OUTPATIENT_CLINIC_OR_DEPARTMENT_OTHER): Payer: Medicare Other | Admitting: Lab

## 2013-01-28 ENCOUNTER — Ambulatory Visit: Payer: Medicare Other

## 2013-01-28 ENCOUNTER — Observation Stay (HOSPITAL_COMMUNITY): Payer: Medicare Other

## 2013-01-28 ENCOUNTER — Ambulatory Visit (HOSPITAL_BASED_OUTPATIENT_CLINIC_OR_DEPARTMENT_OTHER): Payer: Medicare Other

## 2013-01-28 ENCOUNTER — Other Ambulatory Visit: Payer: Self-pay | Admitting: Oncology

## 2013-01-28 ENCOUNTER — Other Ambulatory Visit: Payer: Self-pay | Admitting: *Deleted

## 2013-01-28 ENCOUNTER — Ambulatory Visit (HOSPITAL_BASED_OUTPATIENT_CLINIC_OR_DEPARTMENT_OTHER): Payer: Medicare Other | Admitting: Hematology & Oncology

## 2013-01-28 ENCOUNTER — Encounter (HOSPITAL_COMMUNITY): Payer: Self-pay | Admitting: *Deleted

## 2013-01-28 ENCOUNTER — Inpatient Hospital Stay (HOSPITAL_COMMUNITY)
Admission: AD | Admit: 2013-01-28 | Discharge: 2013-02-13 | DRG: 840 | Disposition: A | Discharge status: Home Health | Payer: Medicare Other | Source: Ambulatory Visit | Attending: Internal Medicine | Admitting: Internal Medicine

## 2013-01-28 VITALS — BP 170/77 | HR 58 | Temp 97.0°F | Resp 18

## 2013-01-28 DIAGNOSIS — E860 Dehydration: Secondary | ICD-10-CM

## 2013-01-28 DIAGNOSIS — R109 Unspecified abdominal pain: Secondary | ICD-10-CM

## 2013-01-28 DIAGNOSIS — I10 Essential (primary) hypertension: Secondary | ICD-10-CM

## 2013-01-28 DIAGNOSIS — I4891 Unspecified atrial fibrillation: Secondary | ICD-10-CM | POA: Diagnosis present

## 2013-01-28 DIAGNOSIS — N189 Chronic kidney disease, unspecified: Secondary | ICD-10-CM

## 2013-01-28 DIAGNOSIS — N170 Acute kidney failure with tubular necrosis: Secondary | ICD-10-CM | POA: Diagnosis present

## 2013-01-28 DIAGNOSIS — E46 Unspecified protein-calorie malnutrition: Secondary | ICD-10-CM

## 2013-01-28 DIAGNOSIS — K5909 Other constipation: Secondary | ICD-10-CM | POA: Diagnosis not present

## 2013-01-28 DIAGNOSIS — N182 Chronic kidney disease, stage 2 (mild): Secondary | ICD-10-CM | POA: Diagnosis present

## 2013-01-28 DIAGNOSIS — R110 Nausea: Secondary | ICD-10-CM

## 2013-01-28 DIAGNOSIS — C8330 Diffuse large B-cell lymphoma, unspecified site: Secondary | ICD-10-CM

## 2013-01-28 DIAGNOSIS — C8589 Other specified types of non-Hodgkin lymphoma, extranodal and solid organ sites: Secondary | ICD-10-CM

## 2013-01-28 DIAGNOSIS — B009 Herpesviral infection, unspecified: Secondary | ICD-10-CM | POA: Diagnosis not present

## 2013-01-28 DIAGNOSIS — C8583 Other specified types of non-Hodgkin lymphoma, intra-abdominal lymph nodes: Principal | ICD-10-CM | POA: Diagnosis present

## 2013-01-28 DIAGNOSIS — Z9221 Personal history of antineoplastic chemotherapy: Secondary | ICD-10-CM

## 2013-01-28 DIAGNOSIS — T451X5A Adverse effect of antineoplastic and immunosuppressive drugs, initial encounter: Secondary | ICD-10-CM | POA: Diagnosis present

## 2013-01-28 DIAGNOSIS — Z7901 Long term (current) use of anticoagulants: Secondary | ICD-10-CM

## 2013-01-28 DIAGNOSIS — E876 Hypokalemia: Secondary | ICD-10-CM | POA: Diagnosis present

## 2013-01-28 DIAGNOSIS — C8590 Non-Hodgkin lymphoma, unspecified, unspecified site: Secondary | ICD-10-CM

## 2013-01-28 DIAGNOSIS — N183 Chronic kidney disease, stage 3 unspecified: Secondary | ICD-10-CM | POA: Diagnosis present

## 2013-01-28 DIAGNOSIS — E1310 Other specified diabetes mellitus with ketoacidosis without coma: Secondary | ICD-10-CM | POA: Diagnosis present

## 2013-01-28 DIAGNOSIS — R630 Anorexia: Secondary | ICD-10-CM

## 2013-01-28 DIAGNOSIS — R112 Nausea with vomiting, unspecified: Secondary | ICD-10-CM | POA: Diagnosis present

## 2013-01-28 DIAGNOSIS — R509 Fever, unspecified: Secondary | ICD-10-CM | POA: Diagnosis not present

## 2013-01-28 DIAGNOSIS — I2510 Atherosclerotic heart disease of native coronary artery without angina pectoris: Secondary | ICD-10-CM | POA: Diagnosis present

## 2013-01-28 DIAGNOSIS — T4275XA Adverse effect of unspecified antiepileptic and sedative-hypnotic drugs, initial encounter: Secondary | ICD-10-CM | POA: Diagnosis not present

## 2013-01-28 DIAGNOSIS — N179 Acute kidney failure, unspecified: Secondary | ICD-10-CM

## 2013-01-28 DIAGNOSIS — I129 Hypertensive chronic kidney disease with stage 1 through stage 4 chronic kidney disease, or unspecified chronic kidney disease: Secondary | ICD-10-CM | POA: Diagnosis present

## 2013-01-28 DIAGNOSIS — K5903 Drug induced constipation: Secondary | ICD-10-CM

## 2013-01-28 DIAGNOSIS — I252 Old myocardial infarction: Secondary | ICD-10-CM

## 2013-01-28 DIAGNOSIS — Z79899 Other long term (current) drug therapy: Secondary | ICD-10-CM

## 2013-01-28 DIAGNOSIS — D709 Neutropenia, unspecified: Secondary | ICD-10-CM | POA: Diagnosis not present

## 2013-01-28 DIAGNOSIS — F411 Generalized anxiety disorder: Secondary | ICD-10-CM | POA: Diagnosis present

## 2013-01-28 DIAGNOSIS — R5381 Other malaise: Secondary | ICD-10-CM | POA: Diagnosis present

## 2013-01-28 DIAGNOSIS — E119 Type 2 diabetes mellitus without complications: Secondary | ICD-10-CM | POA: Diagnosis present

## 2013-01-28 DIAGNOSIS — B370 Candidal stomatitis: Secondary | ICD-10-CM

## 2013-01-28 DIAGNOSIS — E440 Moderate protein-calorie malnutrition: Secondary | ICD-10-CM | POA: Diagnosis present

## 2013-01-28 DIAGNOSIS — D61810 Antineoplastic chemotherapy induced pancytopenia: Secondary | ICD-10-CM | POA: Diagnosis present

## 2013-01-28 DIAGNOSIS — D631 Anemia in chronic kidney disease: Secondary | ICD-10-CM

## 2013-01-28 LAB — COMPREHENSIVE METABOLIC PANEL
ALT: 23 U/L (ref 0–35)
CO2: 22 mEq/L (ref 19–32)
Calcium: 9.1 mg/dL (ref 8.4–10.5)
GFR calc Af Amer: 26 mL/min — ABNORMAL LOW (ref >90)
GFR calc non Af Amer: 23 mL/min — ABNORMAL LOW (ref >90)
Glucose, Bld: 352 mg/dL — ABNORMAL HIGH (ref 70–99)
Sodium: 136 mEq/L (ref 135–145)

## 2013-01-28 LAB — GLUCOSE, CAPILLARY

## 2013-01-28 LAB — LACTATE DEHYDROGENASE: LDH: 269 U/L — ABNORMAL HIGH (ref 94–250)

## 2013-01-28 LAB — CMP (CANCER CENTER ONLY)
CO2: 28 mEq/L (ref 18–33)
Creat: 1.9 mg/dl — ABNORMAL HIGH (ref 0.6–1.2)
Glucose, Bld: 102 mg/dL (ref 73–118)
Total Bilirubin: 0.6 mg/dl (ref 0.20–1.60)

## 2013-01-28 LAB — CBC WITH DIFFERENTIAL/PLATELET
Basophils Absolute: 0 10*3/uL (ref 0.0–0.1)
Eosinophils Absolute: 0 10*3/uL (ref 0.0–0.7)
Lymphs Abs: 0.5 10*3/uL — ABNORMAL LOW (ref 0.7–4.0)
MCH: 25.8 pg — ABNORMAL LOW (ref 26.0–34.0)
MCHC: 33.1 g/dL (ref 30.0–36.0)
MCV: 77.8 fL — ABNORMAL LOW (ref 78.0–100.0)
Monocytes Absolute: 0 10*3/uL — ABNORMAL LOW (ref 0.1–1.0)
Monocytes Relative: 0 % — ABNORMAL LOW (ref 3–12)
Platelets: 212 10*3/uL (ref 150–400)
RDW: 19.7 % — ABNORMAL HIGH (ref 11.5–15.5)
WBC: 13.5 10*3/uL — ABNORMAL HIGH (ref 4.0–10.5)

## 2013-01-28 LAB — CBC WITH DIFFERENTIAL (CANCER CENTER ONLY)
Eosinophils Absolute: 0.2 10*3/uL (ref 0.0–0.5)
HCT: 33.4 % — ABNORMAL LOW (ref 34.8–46.6)
LYMPH%: 5.1 % — ABNORMAL LOW (ref 14.0–48.0)
MCV: 79 fL — ABNORMAL LOW (ref 81–101)
MONO#: 0 10*3/uL — ABNORMAL LOW (ref 0.1–0.9)
NEUT%: 93.2 % — ABNORMAL HIGH (ref 39.6–80.0)
RBC: 4.22 10*6/uL (ref 3.70–5.32)
WBC: 14.6 10*3/uL — ABNORMAL HIGH (ref 3.9–10.0)

## 2013-01-28 MED ORDER — PROMETHAZINE HCL 25 MG/ML IJ SOLN
12.5000 mg | Freq: Four times a day (QID) | INTRAMUSCULAR | Status: DC | PRN
  Administered 2013-01-28 – 2013-01-30 (×5): 12.5 mg via INTRAVENOUS
  Filled 2013-01-28 (×5): qty 1

## 2013-01-28 MED ORDER — HEPARIN SOD (PORK) LOCK FLUSH 100 UNIT/ML IV SOLN
500.0000 [IU] | Freq: Once | INTRAVENOUS | Status: AC
  Administered 2013-01-28: 500 [IU] via INTRAVENOUS
  Filled 2013-01-28: qty 5

## 2013-01-28 MED ORDER — PEGFILGRASTIM INJECTION 6 MG/0.6ML
6.0000 mg | Freq: Once | SUBCUTANEOUS | Status: AC
  Administered 2013-01-28: 6 mg via SUBCUTANEOUS

## 2013-01-28 MED ORDER — LISINOPRIL 10 MG PO TABS: 10.0000 mg | ORAL_TABLET | ORAL | Status: DC

## 2013-01-28 MED ORDER — LIDOCAINE-PRILOCAINE 2.5-2.5 % EX CREA: TOPICAL_CREAM | CUTANEOUS | Status: DC

## 2013-01-28 MED ORDER — SODIUM CHLORIDE 0.9 % IV SOLN
INTRAVENOUS | Status: DC
  Administered 2013-01-28 – 2013-01-30 (×4): via INTRAVENOUS

## 2013-01-28 MED ORDER — ONDANSETRON 16 MG/50ML IVPB (CHCC)
16.0000 mg | Freq: Once | INTRAVENOUS | Status: AC
  Administered 2013-01-28: 16 mg via INTRAVENOUS

## 2013-01-28 MED ORDER — HYDROMORPHONE HCL PF 1 MG/ML IJ SOLN
0.5000 mg | INTRAMUSCULAR | Status: DC | PRN
  Administered 2013-01-28: 0.5 mg via INTRAVENOUS
  Filled 2013-01-28: qty 1

## 2013-01-28 MED ORDER — METOPROLOL TARTRATE 50 MG PO TABS
50.0000 mg | ORAL_TABLET | Freq: Two times a day (BID) | ORAL | Status: DC
  Administered 2013-01-28 – 2013-02-03 (×11): 50 mg via ORAL
  Filled 2013-01-28 (×13): qty 1

## 2013-01-28 MED ORDER — BIOTENE DRY MOUTH MT LIQD
15.0000 mL | Freq: Four times a day (QID) | OROMUCOSAL | Status: DC
  Administered 2013-01-28 – 2013-02-12 (×52): 15 mL via OROMUCOSAL

## 2013-01-28 MED ORDER — PANTOPRAZOLE SODIUM 40 MG IV SOLR
40.0000 mg | Freq: Every day | INTRAVENOUS | Status: DC
  Administered 2013-01-29 – 2013-02-01 (×4): 40 mg via INTRAVENOUS
  Filled 2013-01-28 (×5): qty 40

## 2013-01-28 MED ORDER — HEPARIN SODIUM (PORCINE) 5000 UNIT/ML IJ SOLN
5000.0000 [IU] | Freq: Three times a day (TID) | INTRAMUSCULAR | Status: DC
  Administered 2013-01-29 – 2013-01-31 (×8): 5000 [IU] via SUBCUTANEOUS
  Filled 2013-01-28 (×10): qty 1

## 2013-01-28 MED ORDER — SODIUM CHLORIDE 0.9 % IJ SOLN
10.0000 mL | INTRAMUSCULAR | Status: DC | PRN
  Administered 2013-01-28: 10 mL via INTRAVENOUS
  Filled 2013-01-28: qty 10

## 2013-01-28 MED ORDER — DEXAMETHASONE SODIUM PHOSPHATE 4 MG/ML IJ SOLN
20.0000 mg | Freq: Once | INTRAMUSCULAR | Status: AC
  Administered 2013-01-28: 20 mg via INTRAVENOUS

## 2013-01-28 MED ORDER — POTASSIUM CHLORIDE 10 MEQ/100ML IV SOLN
10.0000 meq | INTRAVENOUS | Status: AC
  Administered 2013-01-28 – 2013-01-29 (×2): 10 meq via INTRAVENOUS
  Filled 2013-01-28 (×2): qty 100

## 2013-01-28 MED ORDER — INSULIN ASPART 100 UNIT/ML ~~LOC~~ SOLN
0.0000 [IU] | SUBCUTANEOUS | Status: DC
  Administered 2013-01-28: 11 [IU] via SUBCUTANEOUS
  Administered 2013-01-29: 3 [IU] via SUBCUTANEOUS

## 2013-01-28 MED ORDER — SODIUM CHLORIDE 0.9 % IV SOLN
INTRAVENOUS | Status: DC
  Administered 2013-01-28: 14:00:00 via INTRAVENOUS

## 2013-01-28 MED ORDER — POTASSIUM CHLORIDE CRYS ER 20 MEQ PO TBCR: EXTENDED_RELEASE_TABLET | ORAL | Status: DC

## 2013-01-28 NOTE — H&P (Signed)
Subjective:   [name] is a 72 year old female with a history of non-hodgkin's lympoma originally diagnosed 10/2009 s/p R-CHOP x8 with remission recently diagnosed with a recurrence and s/p cycle 1 salvage chemotherapy with RICE starting 01/23/13.  Was discharged 01/27/13 and was seen in clinic today.  Pt states that this afternoon ~2-3pm, she ate a hotdog and then went to sleep.  She woke up with abdominal pain.  She describes it as diffuse, 9/10, constant.  She took tramadol without any relief.  She also has nausea with vomiting.  She is not sure if she took her ativan or her compazine but took "a nausea pill" with only mild relief.  Emesis is NBNB, occurring ~4-5x.  Nausea worse with movement and she cannot keep fluids down now.  She has also had 2 loose stools and has been passing flatus.  She took her morning medications but not her pm medications.  She denies fever, chest pain, hematuria.  + mild back pain.  Patient Active Problem List   Diagnosis Date Noted  . Other malignant lymphomas, unspecified site, extranodal and solid organ sites 01/21/2013  . Anemia of renal disease 09/17/2012  . Iron deficiency anemia 08/14/2012  . Large cell (diffuse) non-Hodgkin's lymphoma 08/14/2012   Past Medical History  Diagnosis Date  . Diabetes mellitus without complication   . Hypertension   . Anginal pain   . Headache   . Anxiety     h/o anxiety attacks  . Myocardial infarction 2011  . Cancer     lymphoma  . Arthritis     Left hip  . Anemia of renal disease 09/17/2012  . Bleeding     Past Surgical History  Procedure Laterality Date  . Abdominal hysterectomy      30 yrs. ago  . Appendectomy  2009  . Tonsillectomy      age 72  . Cardiac catheterization  2011  . Rotator cuff repair      Right arm  . Esophagogastroduodenoscopy (egd) with propofol  10/06/2012    Procedure: ESOPHAGOGASTRODUODENOSCOPY (EGD) WITH PROPOFOL;  Surgeon: Willis Modena, MD;  Location: [facility];  Service: Endoscopy;   Laterality: N/A;  . Colonoscopy with propofol  10/06/2012    Procedure: COLONOSCOPY WITH PROPOFOL;  Surgeon: Willis Modena, MD;  Location: [facility];  Service: Endoscopy;  Laterality: N/A;  . Colonoscopy with propofol N/A 01/14/2013    Procedure: COLONOSCOPY WITH PROPOFOL;  Surgeon: Willis Modena, MD;  Location: [facility];  Service: Endoscopy;  Laterality: N/A;    Prescriptions prior to admission  Medication Sig Dispense Refill  . cholecalciferol (VITAMIN D) 1000 UNITS tablet Take 2,000 Units by mouth daily.        . dabigatran (PRADAXA) 75 MG CAPS Take 1 capsule (75 mg total) by mouth 2 (two) times daily.  60 capsule    . esomeprazole (NEXIUM) 40 MG capsule Take 40 mg by mouth daily.        . furosemide (LASIX) 40 MG tablet Take 20 mg by mouth every morning.       Marland Kitchen glimepiride (AMARYL) 4 MG tablet Take 2 mg by mouth every evening.       . lidocaine-prilocaine (EMLA) cream Apply 1 application topically as needed (for port-a-cath access.).      Marland Kitchen lisinopril (PRINIVIL,ZESTRIL) 20 MG tablet Take 10 mg by mouth every morning.       Marland Kitchen LORazepam (ATIVAN) 1 MG tablet Take 1 tablet (1 mg total) by mouth every 6 (six) hours  as needed (Nausea or vomiting.).  30 tablet  3  . metoprolol (LOPRESSOR) 50 MG tablet Take 50 mg by mouth 2 (two) times daily.      . prochlorperazine (COMPAZINE) 10 MG tablet Take 10 mg by mouth every 6 (six) hours as needed (for nausea/vomiting.).      Marland Kitchen traMADol (ULTRAM) 50 MG tablet Take 50-100 mg by mouth every 6 (six) hours as needed for pain.      Marland Kitchen allopurinol (ZYLOPRIM) 100 MG tablet Take 1 tablet (100 mg total) by mouth daily.  21 tablet  0  . antiseptic oral rinse (BIOTENE) LIQD 15 mLs by Mouth Rinse route 4 (four) times daily.  1 Bottle  4  . docusate sodium 100 MG CAPS Take 200 mg by mouth 3 (three) times daily as needed for constipation.  60 capsule  2  . levofloxacin (LEVAQUIN) 500 MG tablet Take 1 tablet (500 mg total) by mouth daily.  30 tablet  4  . sodium  bicarbonate/sodium chloride SOLN 15 application by Mouth Rinse route 4 (four) times daily.       Allergies  Allergen Reactions  . Advicor (Niacin-Lovastatin Er)     Muscle cramps  . Crestor (Rosuvastatin)     Muscle cramps  . Lipitor (Atorvastatin)     Muscle cramps  . Lovastatin     Muscle cramps  . Morphine And Related Other (See Comments)    "made me crazy"  . Niaspan (Niacin Er)     Muscle cramps  . Nsaids     Gi side effects  . Pravachol (Pravastatin Sodium)     Gi side effects  . Sulfa Antibiotics Other (See Comments)    Can't remember   . Vytorin (Ezetimibe-Simvastatin) Nausea Only  . Welchol (Colesevelam Hcl)     Muscle cramps  . Zetia (Ezetimibe)     Muscle cramps   . Zocor (Simvastatin)     Muscle cramps     History  Substance Use Topics  . Smoking status: Former Smoker -- 0.25 packs/day for 15 years  . Smokeless tobacco: Never Used  . Alcohol Use: No    No family history on file.  Review of Systems A comprehensive review of systems was negative.  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  Findings: No disproportionate dilatation of bowel. No obvious free  intraperitoneal gas. Degenerative changes in the spine.  Postoperative changes in the pelvis.  IMPRESSION:  Nonobstructive bowel gas pattern.    Assessment:   [name] is a 64 yof with relapsed large cell lymphoma undergoing C1 RICE (D1=01/23/13) who presents with abdominal pain and nausea and vomiting.  Plan:   1. Abdominal pain with nausea and vomiting- likely multifactorial due to relapsed disease in abdomen and recent chemotherapy administration. Passing gas, no constipation, exam not c/w obstruction - abdominal XR showing non-obstructive bowel gas pattern - IV pain medication (pt states she "went crazy" when she was given morphine so will try 0.5mg  dilaudid) - IV anti-emetics (given that pt has low K and long QT with levaquin this am, will start with promethazine) - IVF - cont PPI - NPO with ice chips/sips with meds  2. AKI- likely due to #1 - gentle IVF - will discuss with team: continuation of allopurinol, pt not taking PO at this time either so will hold - hold pm dose Pradaxa, hold ACE-I  3. Hypokalemia -pt was given prescription for KCl daily--> will give 20 mEq IV KCL x1 instead and recheck (gentle due to AKI)  4. NHL - on C1D5 RICE - will discuss with team in am re: continuation of allopurinol in setting of likely prerenal state - monitor TLS labs  5. HTN- BP likely currently elevated due to pain - pain control - cont metoprolol - hold ACE-I for now given AKI  6. DM2 - hold oral anti-glycemics - POCT Q hours - SSI  7. Hx atrial fibrillation - cont metoprolol - on Pradaxa at home, hold pm dose 2/2 AKI - t/c restart Pradaxa tomorrow vs heparin infusion  8. Anemia- monitor   PPx - SQ heparin   Code status: full

## 2013-01-28 NOTE — Patient Instructions (Signed)
Dehydration, Adult Dehydration is when you lose more fluids from the body than you take in. Vital organs like the kidneys, brain, and heart cannot function without a proper amount of fluids and salt. Any loss of fluids from the body can cause dehydration.  CAUSES   Vomiting.  Diarrhea.  Excessive sweating.  Excessive urine output.  Fever. SYMPTOMS  Mild dehydration  Thirst.  Dry lips.  Slightly dry mouth. Moderate dehydration  Very dry mouth.  Sunken eyes.  Skin does not bounce back quickly when lightly pinched and released.  Dark urine and decreased urine production.  Decreased tear production.  Headache. Severe dehydration  Very dry mouth.  Extreme thirst.  Rapid, weak pulse (more than 100 beats per minute at rest).  Cold hands and feet.  Not able to sweat in spite of heat and temperature.  Rapid breathing.  Blue lips.  Confusion and lethargy.  Difficulty being awakened.  Minimal urine production.  No tears. DIAGNOSIS  Your caregiver will diagnose dehydration based on your symptoms and your exam. Blood and urine tests will help confirm the diagnosis. The diagnostic evaluation should also identify the cause of dehydration. TREATMENT  Treatment of mild or moderate dehydration can often be done at home by increasing the amount of fluids that you drink. It is best to drink small amounts of fluid more often. Drinking too much at one time can make vomiting worse. Refer to the home care instructions below. Severe dehydration needs to be treated at the hospital where you will probably be given intravenous (IV) fluids that contain water and electrolytes. HOME CARE INSTRUCTIONS   Ask your caregiver about specific rehydration instructions.  Drink enough fluids to keep your urine clear or pale yellow.  Drink small amounts frequently if you have nausea and vomiting.  Eat as you normally do.  Avoid:  Foods or drinks high in sugar.  Carbonated  drinks.  Juice.  Extremely hot or cold fluids.  Drinks with caffeine.  Fatty, greasy foods.  Alcohol.  Tobacco.  Overeating.  Gelatin desserts.  Wash your hands well to avoid spreading bacteria and viruses.  Only take over-the-counter or prescription medicines for pain, discomfort, or fever as directed by your caregiver.  Ask your caregiver if you should continue all prescribed and over-the-counter medicines.  Keep all follow-up appointments with your caregiver. SEEK MEDICAL CARE IF:  You have abdominal pain and it increases or stays in one area (localizes).  You have a rash, stiff neck, or severe headache.  You are irritable, sleepy, or difficult to awaken.  You are weak, dizzy, or extremely thirsty. SEEK IMMEDIATE MEDICAL CARE IF:   You are unable to keep fluids down or you get worse despite treatment.  You have frequent episodes of vomiting or diarrhea.  You have blood or green matter (bile) in your vomit.  You have blood in your stool or your stool looks black and tarry.  You have not urinated in 6 to 8 hours, or you have only urinated a small amount of very dark urine.  You have a fever.  You faint. MAKE SURE YOU:   Understand these instructions.  Will watch your condition.  Will get help right away if you are not doing well or get worse. Document Released: 10/15/2005 Document Revised: 01/07/2012 Document Reviewed: 06/04/2011 [employer] Patient Information 2013 [employer].  

## 2013-01-28 NOTE — Patient Instructions (Addendum)
Dehydration, Adult Dehydration is when you lose more fluids from the body than you take in. Vital organs like the kidneys, brain, and heart cannot function without a proper amount of fluids and salt. Any loss of fluids from the body can cause dehydration.  CAUSES   Vomiting.  Diarrhea.  Excessive sweating.  Excessive urine output.  Fever. SYMPTOMS  Mild dehydration  Thirst.  Dry lips.  Slightly dry mouth. Moderate dehydration  Very dry mouth.  Sunken eyes.  Skin does not bounce back quickly when lightly pinched and released.  Dark urine and decreased urine production.  Decreased tear production.  Headache. Severe dehydration  Very dry mouth.  Extreme thirst.  Rapid, weak pulse (more than 100 beats per minute at rest).  Cold hands and feet.  Not able to sweat in spite of heat and temperature.  Rapid breathing.  Blue lips.  Confusion and lethargy.  Difficulty being awakened.  Minimal urine production.  No tears. DIAGNOSIS  Your caregiver will diagnose dehydration based on your symptoms and your exam. Blood and urine tests will help confirm the diagnosis. The diagnostic evaluation should also identify the cause of dehydration. TREATMENT  Treatment of mild or moderate dehydration can often be done at home by increasing the amount of fluids that you drink. It is best to drink small amounts of fluid more often. Drinking too much at one time can make vomiting worse. Refer to the home care instructions below. Severe dehydration needs to be treated at the hospital where you will probably be given intravenous (IV) fluids that contain water and electrolytes. HOME CARE INSTRUCTIONS   Ask your caregiver about specific rehydration instructions.  Drink enough fluids to keep your urine clear or pale yellow.  Drink small amounts frequently if you have nausea and vomiting.  Eat as you normally do.  Avoid:  Foods or drinks high in sugar.  Carbonated  drinks.  Juice.  Extremely hot or cold fluids.  Drinks with caffeine.  Fatty, greasy foods.  Alcohol.  Tobacco.  Overeating.  Gelatin desserts.  Wash your hands well to avoid spreading bacteria and viruses.  Only take over-the-counter or prescription medicines for pain, discomfort, or fever as directed by your caregiver.  Ask your caregiver if you should continue all prescribed and over-the-counter medicines.  Keep all follow-up appointments with your caregiver. SEEK MEDICAL CARE IF:  You have abdominal pain and it increases or stays in one area (localizes).  You have a rash, stiff neck, or severe headache.  You are irritable, sleepy, or difficult to awaken.  You are weak, dizzy, or extremely thirsty. SEEK IMMEDIATE MEDICAL CARE IF:   You are unable to keep fluids down or you get worse despite treatment.  You have frequent episodes of vomiting or diarrhea.  You have blood or green matter (bile) in your vomit.  You have blood in your stool or your stool looks black and tarry.  You have not urinated in 6 to 8 hours, or you have only urinated a small amount of very dark urine.  You have a fever.  You faint. MAKE SURE YOU:   Understand these instructions.  Will watch your condition.  Will get help right away if you are not doing well or get worse. Document Released: 10/15/2005 Document Revised: 01/07/2012 Document Reviewed: 06/04/2011 Exit[employer] Patient Information 2013 [employer].  

## 2013-01-28 NOTE — Progress Notes (Signed)
This office note has been dictated.

## 2013-01-29 DIAGNOSIS — E860 Dehydration: Secondary | ICD-10-CM

## 2013-01-29 DIAGNOSIS — N289 Disorder of kidney and ureter, unspecified: Secondary | ICD-10-CM

## 2013-01-29 LAB — URINE MICROSCOPIC-ADD ON

## 2013-01-29 LAB — COMPREHENSIVE METABOLIC PANEL
AST: 20 U/L (ref 0–37)
Albumin: 2.3 g/dL — ABNORMAL LOW (ref 3.5–5.2)
Calcium: 9 mg/dL (ref 8.4–10.5)
Creatinine, Ser: 2.17 mg/dL — ABNORMAL HIGH (ref 0.50–1.10)
Total Protein: 5.3 g/dL — ABNORMAL LOW (ref 6.0–8.3)

## 2013-01-29 LAB — URINALYSIS, ROUTINE W REFLEX MICROSCOPIC
Leukocytes, UA: NEGATIVE
Nitrite: NEGATIVE
Protein, ur: 100 mg/dL — AB
Specific Gravity, Urine: 1.019 (ref 1.005–1.030)
Urobilinogen, UA: 0.2 mg/dL (ref 0.0–1.0)

## 2013-01-29 LAB — PHOSPHORUS: Phosphorus: 4.5 mg/dL (ref 2.3–4.6)

## 2013-01-29 LAB — CBC
Hemoglobin: 10.5 g/dL — ABNORMAL LOW (ref 12.0–15.0)
MCH: 26.9 pg (ref 26.0–34.0)
MCV: 77.7 fL — ABNORMAL LOW (ref 78.0–100.0)
RBC: 3.91 MIL/uL (ref 3.87–5.11)
WBC: 12.4 10*3/uL — ABNORMAL HIGH (ref 4.0–10.5)

## 2013-01-29 LAB — GLUCOSE, CAPILLARY
Glucose-Capillary: 153 mg/dL — ABNORMAL HIGH (ref 70–99)
Glucose-Capillary: 80 mg/dL (ref 70–99)
Glucose-Capillary: 81 mg/dL (ref 70–99)
Glucose-Capillary: 116 mg/dL — ABNORMAL HIGH (ref 70–99)

## 2013-01-29 LAB — LACTATE DEHYDROGENASE: LDH: 330 U/L — ABNORMAL HIGH (ref 94–250)

## 2013-01-29 MED ORDER — VITAMINS A & D EX OINT
TOPICAL_OINTMENT | CUTANEOUS | Status: AC
  Administered 2013-01-29: 18:00:00
  Filled 2013-01-29: qty 5

## 2013-01-29 MED ORDER — POTASSIUM CHLORIDE 10 MEQ/100ML IV SOLN
10.0000 meq | INTRAVENOUS | Status: AC
  Administered 2013-01-29 (×2): 10 meq via INTRAVENOUS
  Filled 2013-01-29 (×2): qty 100

## 2013-01-29 MED ORDER — CLONIDINE HCL 0.2 MG PO TABS
0.2000 mg | ORAL_TABLET | Freq: Every day | ORAL | Status: DC
  Administered 2013-01-29 – 2013-01-30 (×2): 0.2 mg via ORAL
  Filled 2013-01-29 (×3): qty 1

## 2013-01-29 MED ORDER — INSULIN ASPART 100 UNIT/ML ~~LOC~~ SOLN
0.0000 [IU] | Freq: Three times a day (TID) | SUBCUTANEOUS | Status: DC
  Administered 2013-01-30: 2 [IU] via SUBCUTANEOUS
  Administered 2013-01-31 – 2013-02-01 (×4): 3 [IU] via SUBCUTANEOUS
  Administered 2013-02-01: 2 [IU] via SUBCUTANEOUS
  Administered 2013-02-01: 3 [IU] via SUBCUTANEOUS
  Administered 2013-02-02: 5 [IU] via SUBCUTANEOUS
  Administered 2013-02-02 – 2013-02-03 (×2): 3 [IU] via SUBCUTANEOUS
  Administered 2013-02-03: 2 [IU] via SUBCUTANEOUS
  Administered 2013-02-05 – 2013-02-06 (×3): 3 [IU] via SUBCUTANEOUS
  Administered 2013-02-07: 2 [IU] via SUBCUTANEOUS

## 2013-01-29 MED ORDER — HYDROMORPHONE HCL PF 1 MG/ML IJ SOLN
0.5000 mg | INTRAMUSCULAR | Status: DC | PRN
  Administered 2013-01-29 – 2013-02-05 (×25): 0.5 mg via INTRAVENOUS
  Filled 2013-01-29 (×28): qty 1

## 2013-01-29 MED ORDER — AMLODIPINE BESYLATE 5 MG PO TABS
5.0000 mg | ORAL_TABLET | Freq: Every day | ORAL | Status: DC
  Administered 2013-01-29: 5 mg via ORAL
  Filled 2013-01-29 (×2): qty 1

## 2013-01-29 NOTE — Progress Notes (Signed)
Mrs. Stotler is now back in the hospital. I saw in the office yesterday. She is having some issues with nausea vomiting dehydration. We get her fluids and IV antibiotics. She felt a lot better. That night, she began to have abdominal pain. This is on the right side. She was readmitted. Her blood pressure was very high. Her renal function was a little bit down. Lisinopril was stopped and Norvasc was started.  If she is n.p.o. right now.  Abdominal x-rays did not show any obstruction or perforation.  Her blood sugars have been doing okay.   She got Neulasta yesterday. Her CBC is not too bad.  She saw some discomfort in the abdomen. Again this might be were the lymphoma is. We may consider a CT of the abdomen. She's not neutropenic so I would not think that typhlitis would be an issue. She's not bleeding. She's not having diarrhea.  On her physical exam blood pressure 176/78. Pulse is 62. Temperature 97.9. Oral mucosa is without mucositis. There is no for the swelling above the right eye. There is no adenopathy in the neck. There is no thyroid. Lungs are clear bilaterally. Cardiac exam regular in rhythm with no murmurs rubs or bruits. Abdominal exam is soft. Bowel sounds are okay. There is some tenderness over the right side. No distinct masses noted in that area. There is no palpable hepatomegaly. Extremities shows no clubbing cyanosis or edema.  We will continue her on IV fluids. I'll start her on some clear liquids.  Will consider a CT of the abdomen depend on her symptoms.  We will continue to follow her lab work daily. Be careful with her renal function.  Her blood sugar was on the high side yesterday. This is improved today.  Hewitt Shorts  Psalm 55:22

## 2013-01-29 NOTE — Progress Notes (Signed)
Pt's last BP 207/69 with HR 55. Paged Dr. Myna Hidalgo, received order for daily clonidine 0.2 mg. Eugene Garnet RN

## 2013-01-29 NOTE — Progress Notes (Signed)
DIAGNOSIS:  Recurrent large cell non-Hodgkin lymphoma.  CURRENT THERAPY:  Patient is status post cycle 1 of R-ICE.  INTERIM HISTORY:  [name] comes in for an unscheduled visit today. She actually was in getting her Neulasta.  She completed her first cycle of chemotherapy on the 30th.  We did keep her a couple of extra days.  She did have some fluid issues.  We gave her some diuretics.  She had some blood pressure issues.  We readjusted her blood pressure medication.  She was discharged yesterday.  She is on oral antibiotics as prophylaxis.  She comes in looking a little dehydrated.  She is having a lot of nausea at home.  She got some Zofran before she was discharged.  She is on Ativan and Compazine at home.  She is not eating that much.  She is having no diarrhea.  She was constipated in the hospital.  We gave her some lactulose, which helped her.  She is not having any fever.  There is no cough.  PHYSICAL EXAMINATION:  General:  This is a slightly ill-appearing white female in no obvious distress.  Vital Signs:  Temperature of 99, pulse is 88, respiratory rate 16, blood pressure 170/84.  Head and neck:  No mucositis.  Oral mucosa is moist.  There is no swelling above the right eye now.  She has no obvious adenopathy in the neck.  Lungs:  Clear bilaterally.  Cardiac:  Irregular rate.  She has intermediate fibrillation.  Abdomen:  Soft with good bowel sounds.  There is no tenderness.  There is no fluid wave.  No palpable hepatosplenomegaly. Extremities:  No clubbing, cyanosis, or edema.  Neurological:  No focal neurological deficits.  LABORATORY STUDIES:  White cell count is 14.6, hemoglobin 11.2, hematocrit 33.4, platelet count 200,000.  Sodium 137, potassium 3.1.  LFTs are normal.  Glucose is 102.  IMPRESSION:  [name] is a 72 year old white female with recurrent large cell lymphoma.  She has not been treated for 3 years.  She seemed to tolerate chemo quite  well in the hospital.  I do not think this is a real big surprise.  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  She was last transfused on the 30th.  We will go ahead and give some IV fluids today.  I will put some Decadron and Zofran into the IV fluids.  Hopefully, this will make her feel a little bit better.  We certainly can have [name] come back at any time for IV fluids or other issues.  I will order some potassium for her that she can take at home.    ______________________________ Josph Macho, M.D. PRE/MEDQ  D:  01/28/2013  T:  01/29/2013  Job:  [id number]

## 2013-01-30 DIAGNOSIS — D72819 Decreased white blood cell count, unspecified: Secondary | ICD-10-CM

## 2013-01-30 DIAGNOSIS — R030 Elevated blood-pressure reading, without diagnosis of hypertension: Secondary | ICD-10-CM

## 2013-01-30 LAB — URINE CULTURE: Colony Count: NO GROWTH

## 2013-01-30 LAB — GLUCOSE, CAPILLARY
Glucose-Capillary: 175 mg/dL — ABNORMAL HIGH (ref 70–99)
Glucose-Capillary: 131 mg/dL — ABNORMAL HIGH (ref 70–99)
Glucose-Capillary: 111 mg/dL — ABNORMAL HIGH (ref 70–99)
Glucose-Capillary: 208 mg/dL — ABNORMAL HIGH (ref 70–99)

## 2013-01-30 LAB — CBC
MCV: 78.3 fL (ref 78.0–100.0)
Platelets: 105 10*3/uL — ABNORMAL LOW (ref 150–400)
RBC: 3.73 MIL/uL — ABNORMAL LOW (ref 3.87–5.11)
WBC: 2.1 10*3/uL — ABNORMAL LOW (ref 4.0–10.5)

## 2013-01-30 LAB — BASIC METABOLIC PANEL
CO2: 24 mEq/L (ref 19–32)
Chloride: 102 mEq/L (ref 96–112)
Creatinine, Ser: 2.1 mg/dL — ABNORMAL HIGH (ref 0.50–1.10)
Potassium: 2.6 mEq/L — CL (ref 3.5–5.1)
Sodium: 135 mEq/L (ref 135–145)

## 2013-01-30 MED ORDER — METOCLOPRAMIDE HCL 5 MG/ML IJ SOLN
10.0000 mg | Freq: Four times a day (QID) | INTRAMUSCULAR | Status: DC | PRN
  Administered 2013-01-30 – 2013-02-06 (×4): 10 mg via INTRAVENOUS
  Filled 2013-01-30 (×5): qty 2

## 2013-01-30 MED ORDER — LEVOFLOXACIN 500 MG PO TABS
500.0000 mg | ORAL_TABLET | Freq: Every day | ORAL | Status: DC
  Filled 2013-01-30: qty 1

## 2013-01-30 MED ORDER — POTASSIUM CHLORIDE 10 MEQ/50ML IV SOLN
10.0000 meq | INTRAVENOUS | Status: AC
  Administered 2013-01-30 (×4): 10 meq via INTRAVENOUS
  Filled 2013-01-30 (×4): qty 50

## 2013-01-30 MED ORDER — POTASSIUM CHLORIDE 10 MEQ/50ML IV SOLN: 10.0000 meq | INTRAVENOUS | Status: DC

## 2013-01-30 MED ORDER — AMLODIPINE BESYLATE 10 MG PO TABS
10.0000 mg | ORAL_TABLET | Freq: Every day | ORAL | Status: DC
  Administered 2013-01-30 – 2013-02-01 (×3): 10 mg via ORAL
  Filled 2013-01-30 (×4): qty 1

## 2013-01-30 MED ORDER — POTASSIUM CHLORIDE 10 MEQ/50ML IV SOLN
10.0000 meq | INTRAVENOUS | Status: DC
  Administered 2013-01-30: 10 meq via INTRAVENOUS
  Filled 2013-01-30 (×3): qty 50

## 2013-01-30 MED ORDER — LEVOFLOXACIN 500 MG PO TABS
500.0000 mg | ORAL_TABLET | ORAL | Status: DC
  Administered 2013-01-30: 500 mg via ORAL
  Filled 2013-01-30 (×2): qty 1

## 2013-01-30 MED ORDER — POTASSIUM CHLORIDE IN NACL 40-0.9 MEQ/L-% IV SOLN
INTRAVENOUS | Status: DC
  Administered 2013-01-30 – 2013-02-03 (×5): via INTRAVENOUS
  Filled 2013-01-30 (×10): qty 1000

## 2013-01-30 NOTE — Progress Notes (Signed)
CRITICAL VALUE ALERT  Critical value received:  2.6 K+ level  Date of notification:  01/30/2013  Time of notification:  0600  Critical value read back:Yes  Nurse who received alert:  Kenton Kingfisher, RN  MD notified (1st page):  Clelia Croft  Time of first page:  0605  MD notified (2nd page): N/A  Time of second page: N/A  Responding MD:  Clelia Croft  Time MD responded:  1610 (orders received)

## 2013-01-30 NOTE — Progress Notes (Signed)
This RN was called into pt's room by pt's son, who was concerned about pt's lung sounds while sleeping. This RN learned fine crackles in pts left lower lobe. Brought incentive spirometer to pts room, educated about using 10x per hour. Pt only able to inhale up to 500ml at a time. This RN called Dr. Ennever about concerns that pt is becoming over sedated with phenergan. Received telephone order to d/c phenergan and to start 10 mg reglan q 6 hours PRN. Shaylon Gillean M Marlen Mollica RN  

## 2013-01-30 NOTE — Progress Notes (Signed)
[name] is still have issues with her blood pressure. It still quite high. I will increase her Norvasc to 10 mg a day. We started her on clonidine yesterday at 0.2 mg a day.  Her mouth is a little sore. Her white cell count is starting to go down. This is no surprise.  Her potassium was 2.6 today. I will put potassium in her IV fluids and also give her supplemental potassium via the IV.  She's eating some full liquids.  She still has no abdominal pain. There's no diarrhea.. There is no bleeding.  It looks like her we'll function is stabilizing a little bit. Blood sugars have not been too bad.  On her physical exam, her vital signs temperature 97.8 pulse 64 blood pressure 188/67. Her oral cavity is slightly dry. There is no mucositis. No adenopathy noted on her neck. Lungs are clear bilaterally. Cardiac exam regular rate and rhythm with no murmurs rubs or bruits. Abdominal exam is soft. She has decent bowel sounds. There is some epigastric tenderness to palpation. Extremities shows no clubbing cyanosis or edema.   For now, we need to continue to support her through. We will need to keep managing her blood pressure issues. She will be neutropenic soon.  I suspect that she will be in the hospital for a least another 4-5 days.  Pete E.  Psalm 91:1-2

## 2013-01-31 ENCOUNTER — Inpatient Hospital Stay (HOSPITAL_COMMUNITY): Payer: Medicare Other

## 2013-01-31 ENCOUNTER — Encounter: Payer: Self-pay | Admitting: Hematology & Oncology

## 2013-01-31 DIAGNOSIS — R1032 Left lower quadrant pain: Secondary | ICD-10-CM

## 2013-01-31 DIAGNOSIS — R110 Nausea: Secondary | ICD-10-CM

## 2013-01-31 DIAGNOSIS — D61818 Other pancytopenia: Secondary | ICD-10-CM

## 2013-01-31 LAB — URINALYSIS, ROUTINE W REFLEX MICROSCOPIC
Glucose, UA: >1000 mg/dL — AB
Ketones, ur: NEGATIVE mg/dL
Leukocytes, UA: NEGATIVE
Nitrite: NEGATIVE
Protein, ur: 100 mg/dL — AB

## 2013-01-31 LAB — URINE MICROSCOPIC-ADD ON

## 2013-01-31 LAB — CBC
MCH: 26.8 pg (ref 26.0–34.0)
MCHC: 34.7 g/dL (ref 30.0–36.0)
MCV: 77 fL — ABNORMAL LOW (ref 78.0–100.0)
Platelets: 55 10*3/uL — ABNORMAL LOW (ref 150–400)

## 2013-01-31 LAB — BASIC METABOLIC PANEL
Calcium: 8.7 mg/dL (ref 8.4–10.5)
Creatinine, Ser: 2.16 mg/dL — ABNORMAL HIGH (ref 0.50–1.10)
GFR calc non Af Amer: 22 mL/min — ABNORMAL LOW (ref >90)
Sodium: 134 mEq/L — ABNORMAL LOW (ref 135–145)

## 2013-01-31 MED ORDER — GLUCERNA SHAKE PO LIQD
237.0000 mL | Freq: Three times a day (TID) | ORAL | Status: DC
Start: 2013-01-31 — End: 2013-02-03
  Administered 2013-01-31 – 2013-02-02 (×2): 237 mL via ORAL
  Filled 2013-01-31 (×11): qty 237

## 2013-01-31 MED ORDER — FLUCONAZOLE 100 MG PO TABS
100.0000 mg | ORAL_TABLET | Freq: Every day | ORAL | Status: DC
  Administered 2013-01-31 – 2013-02-06 (×7): 100 mg via ORAL
  Filled 2013-01-31 (×8): qty 1

## 2013-01-31 MED ORDER — DEXTROSE 5 % IV SOLN
2.0000 g | Freq: Two times a day (BID) | INTRAVENOUS | Status: DC
  Administered 2013-02-01 (×3): 2 g via INTRAVENOUS
  Filled 2013-01-31 (×4): qty 2

## 2013-01-31 MED ORDER — CLONIDINE HCL 0.2 MG PO TABS
0.2000 mg | ORAL_TABLET | Freq: Three times a day (TID) | ORAL | Status: DC
  Administered 2013-01-31 – 2013-02-01 (×4): 0.2 mg via ORAL
  Filled 2013-01-31 (×8): qty 1

## 2013-01-31 MED ORDER — VANCOMYCIN HCL IN DEXTROSE 750-5 MG/150ML-% IV SOLN
750.0000 mg | Freq: Two times a day (BID) | INTRAVENOUS | Status: DC
  Administered 2013-02-01 – 2013-02-02 (×3): 750 mg via INTRAVENOUS
  Filled 2013-01-31 (×4): qty 150

## 2013-01-31 MED ORDER — OXYCODONE HCL ER 10 MG PO T12A
10.0000 mg | EXTENDED_RELEASE_TABLET | Freq: Two times a day (BID) | ORAL | Status: DC
  Administered 2013-01-31 – 2013-02-02 (×4): 10 mg via ORAL
  Filled 2013-01-31 (×5): qty 1

## 2013-01-31 MED ORDER — POTASSIUM CHLORIDE 10 MEQ/100ML IV SOLN
10.0000 meq | INTRAVENOUS | Status: AC
  Administered 2013-01-31 (×4): 10 meq via INTRAVENOUS
  Filled 2013-01-31 (×4): qty 100

## 2013-01-31 NOTE — Progress Notes (Signed)
[facility] INPATIENT PROGRESS NOTE  Name: [name]      MRN: [id number]    Location: 1342/1342-01  Date: 01/31/2013 Time:8:20 AM   Subjective: Interval History:[name] reported generalized fatigue that been getting worse.  She has mouth sore worse than when she was first admitted.  She has some nausea but no vomiting.  She still has moderate midepigastric discomfort in addition to the right lower quadrant from her recurrent lymphoma.  The pain is crampy, no radiating; slightly worsened with eating, no relation with bowel movement.  She has normal bowel movement.  She denied bleeding symptoms, fever, SOB, chest pain, skin rash.   Objective: Vital signs in last 24 hours: Temp:  [97.5 F (36.4 C)-98.5 F (36.9 C)] 97.5 F (36.4 C) (04/05 0500) Pulse Rate:  [56-74] 74 (04/05 0500) Resp:  [18-20] 18 (04/05 0500) BP: (141-196)/(63-90) 196/77 mmHg (04/05 0500) SpO2:  [97 %-100 %] 99 % (04/05 0500) Weight:  [160 lb 8 oz (72.802 kg)] 160 lb 8 oz (72.802 kg) (04/05 0500)     PHYSICAL EXAM:  Gen: tired appearing woman, in no acute distress. Eyes: No scleral icterus or jaundice. ENT: There was oral thrush.  Neck was supple without thyromegaly. Lymphatics: Negative for cervical, supraclavicular, axillary, or inguinal adenopathy.  Respiratory: Lungs were clear bilaterally without wheezing or crackles. Cardiovascular: normal heart rate and rhythm; S1/S2; without murmur, rubs, or gallop. There was no pedal edema. GI: Abdomen was soft, flat, nontender, nondistended, without organomegaly. Musculoskeletal exam: No spinal tenderness on palpation of vertebral spine. Skin exam was without ecchymosis, petechiae. Neuro exam was nonfocal. Patient was alert and oriented. Attention was good. Language was appropriate. Mood was normal without depression. Speech was not pressured. Thought content was not tangential.     Studies/Results: Results for orders placed during the hospital  encounter of 01/28/13 (from the past 48 hour(s))  GLUCOSE, CAPILLARY     Status: None   Collection Time    01/29/13 12:05 PM      Result Value Range   Glucose-Capillary 81  70 - 99 mg/dL  GLUCOSE, CAPILLARY     Status: Abnormal   Collection Time    01/29/13  5:48 PM      Result Value Range   Glucose-Capillary 116 (*) 70 - 99 mg/dL  GLUCOSE, CAPILLARY     Status: Abnormal   Collection Time    01/29/13  8:48 PM      Result Value Range   Glucose-Capillary 175 (*) 70 - 99 mg/dL  CBC     Status: Abnormal   Collection Time    01/30/13  5:15 AM      Result Value Range   WBC 2.1 (*) 4.0 - 10.5 K/uL   RBC 3.73 (*) 3.87 - 5.11 MIL/uL   Hemoglobin 10.0 (*) 12.0 - 15.0 g/dL   HCT 24.4 (*) 01.0 - 27.2 %   MCV 78.3  78.0 - 100.0 fL   MCH 26.8  26.0 - 34.0 pg   MCHC 34.2  30.0 - 36.0 g/dL   RDW 53.6 (*) 64.4 - 03.4 %   Platelets 105 (*) 150 - 400 K/uL   Comment: SPECIMEN CHECKED FOR CLOTS     REPEATED TO VERIFY     DELTA CHECK NOTED     PLATELET COUNT CONFIRMED BY SMEAR  BASIC METABOLIC PANEL     Status: Abnormal   Collection Time    01/30/13  5:15 AM      Result  Value Range   Sodium 135  135 - 145 mEq/L   Potassium 2.6 (*) 3.5 - 5.1 mEq/L   Comment: CRITICAL RESULT CALLED TO, READ BACK BY AND VERIFIED WITH:     MILLS,M/3W @0602  ON 01/30/13 BY KARCZEWSKI,S.   Chloride 102  96 - 112 mEq/L   CO2 24  19 - 32 mEq/L   Glucose, Bld 139 (*) 70 - 99 mg/dL   BUN 26 (*) 6 - 23 mg/dL   Creatinine, Ser 4.09 (*) 0.50 - 1.10 mg/dL   Calcium 8.5  8.4 - 81.1 mg/dL   GFR calc non Af Amer 23 (*) >90 mL/min   GFR calc Af Amer 26 (*) >90 mL/min   Comment:            The eGFR has been calculated     using the CKD EPI equation.     This calculation has not been     validated in all clinical     situations.     eGFR's persistently     <90 mL/min signify     possible Chronic Kidney Disease.  GLUCOSE, CAPILLARY     Status: Abnormal   Collection Time    01/30/13  7:38 AM      Result Value Range    Glucose-Capillary 131 (*) 70 - 99 mg/dL   Comment 1 Notify RN    GLUCOSE, CAPILLARY     Status: Abnormal   Collection Time    01/30/13 11:26 AM      Result Value Range   Glucose-Capillary 111 (*) 70 - 99 mg/dL   Comment 1 Notify RN    GLUCOSE, CAPILLARY     Status: Abnormal   Collection Time    01/30/13  8:43 PM      Result Value Range   Glucose-Capillary 208 (*) 70 - 99 mg/dL   Comment 1 Notify RN    GLUCOSE, CAPILLARY     Status: Abnormal   Collection Time    01/31/13 12:10 AM      Result Value Range   Glucose-Capillary 183 (*) 70 - 99 mg/dL  CBC     Status: Abnormal   Collection Time    01/31/13  6:00 AM      Result Value Range   WBC 0.4 (*) 4.0 - 10.5 K/uL   Comment: REPEATED TO VERIFY     CRITICAL RESULT CALLED TO, READ BACK BY AND VERIFIED WITH:     M.MILLS RN AT [id number] ON [id number] BY C.BONGEL   RBC 3.70 (*) 3.87 - 5.11 MIL/uL   Hemoglobin 9.9 (*) 12.0 - 15.0 g/dL   HCT 30.8 (*) 65.7 - 84.6 %   MCV 77.0 (*) 78.0 - 100.0 fL   MCH 26.8  26.0 - 34.0 pg   MCHC 34.7  30.0 - 36.0 g/dL   RDW 96.2 (*) 95.2 - 84.1 %   Platelets 55 (*) 150 - 400 K/uL   Comment: DELTA CHECK NOTED     SPECIMEN CHECKED FOR CLOTS     REPEATED TO VERIFY     PLATELET COUNT CONFIRMED BY SMEAR     RESULT CALLED TO, READ BACK BY AND VERIFIED WITH:     M.MILLS RN AT 3244 ON [id number] BY C.BONGEL  BASIC METABOLIC PANEL     Status: Abnormal   Collection Time    01/31/13  6:00 AM      Result Value Range   Sodium 134 (*) 135 - 145 mEq/L   Potassium 2.9 (*)  3.5 - 5.1 mEq/L   Chloride 103  96 - 112 mEq/L   CO2 22  19 - 32 mEq/L   Glucose, Bld 189 (*) 70 - 99 mg/dL   BUN 27 (*) 6 - 23 mg/dL   Creatinine, Ser 1.61 (*) 0.50 - 1.10 mg/dL   Calcium 8.7  8.4 - 09.6 mg/dL   GFR calc non Af Amer 22 (*) >90 mL/min   GFR calc Af Amer 25 (*) >90 mL/min   Comment:            The eGFR has been calculated     using the CKD EPI equation.     This calculation has not been     validated in all clinical      situations.     eGFR's persistently     <90 mL/min signify     possible Chronic Kidney Disease.  GLUCOSE, CAPILLARY     Status: Abnormal   Collection Time    01/31/13  7:32 AM      Result Value Range   Glucose-Capillary 161 (*) 70 - 99 mg/dL   Comment 1 Notify RN     No results found.   MEDICATIONS: reviewed.     PROBLEM LIST:  1.  Recurrent nonHodgkin lymphoma; s/p recent chemo R-ICE. 2.  Pancytopenia from recent chemo. 3.  Abdominal pain from most likely recurrent lymphoma and neutropenia. 4.  Nausea from recent chemo. 5.  Hypertension from presumed pain. 6.  Generalized decondition. 7.  Hypokalemia.  8.  Acute renal insufficiency from most likely poor PO intake.  9.  Moderate calorie protein malnutrition.  10. Diabetes mellitus.     PLAN:  - I will start Diflucan. - I will increase clonidine from once daily to q8hour.  Continue Amlodipine, Metoprolol.  - I will start long acting pain med with OxyContin since she does not tolerate morphine.   She has Dilaudid prn breakthrough pain.  - Increase IVF to 75cc/hr due to Cr still elevated.  - Add on KCl IV x 1.  - Continue to monitor CBC daily and transfuse when Hgb <8 or Plt <20K or active bleeding.  She had received Neulasta after the last chemo.  She has neutropenia without active infection; therefore, Neupogen is not indicated.  - Continue insulin sliding scale.  - She requested change in diet.  I advanced her to mechanical soft with boost in between.  - If abdominal pain significantly worsen, we may consider abdominal imaging the next few days.

## 2013-01-31 NOTE — Progress Notes (Signed)
Critical labs WBC (0.4) and Platelets(55) received for this patient around 0700 am.  This information was given to the day shift, RN who will follow up with rounding physician.[name]

## 2013-01-31 NOTE — Progress Notes (Signed)
ANTIBIOTIC CONSULT NOTE - INITIAL  Pharmacy Consult for vancomycin/cefepime Indication: Neutropenic fever  Allergies  Allergen Reactions  . Advicor (Niacin-Lovastatin Er)     Muscle cramps  . Crestor (Rosuvastatin)     Muscle cramps  . Lipitor (Atorvastatin)     Muscle cramps  . Lovastatin     Muscle cramps  . Morphine And Related Other (See Comments)    "made me crazy"  . Niaspan (Niacin Er)     Muscle cramps  . Nsaids     Gi side effects  . Pravachol (Pravastatin Sodium)     Gi side effects  . Sulfa Antibiotics Other (See Comments)    Can't remember   . Vytorin (Ezetimibe-Simvastatin) Nausea Only  . Welchol (Colesevelam Hcl)     Muscle cramps  . Zetia (Ezetimibe)     Muscle cramps   . Zocor (Simvastatin)     Muscle cramps     Patient Measurements: Height: 5\' 2"  (157.5 cm) Weight: 160 lb 8 oz (72.802 kg) IBW/kg (Calculated) : 50.1 Adjusted Body Weight:   Vital Signs: Temp: 100.5 F (38.1 C) (04/05 2212) Temp src: Axillary (04/05 2212) BP: 136/58 mmHg (04/05 2212) Pulse Rate: 74 (04/05 2212) Intake/Output from previous day: 04/04 0701 - 04/05 0700 In: 1307.5 [I.V.:1307.5] Out: 1250 [Urine:1250] Intake/Output from this shift:    Labs:  Recent Labs  01/29/13 0435 01/30/13 0515 01/31/13 0600  WBC 12.4* 2.1* 0.4*  HGB 10.5* 10.0* 9.9*  PLT 170 105* 55*  CREATININE 2.17* 2.10* 2.16*   Estimated Creatinine Clearance: 22.6 ml/min (by C-G formula based on Cr of 2.16). No results found for this basename: VANCOTROUGH, Leodis Binet, VANCORANDOM, GENTTROUGH, GENTPEAK, GENTRANDOM, TOBRATROUGH, TOBRAPEAK, TOBRARND, AMIKACINPEAK, AMIKACINTROU, AMIKACIN,  in the last 72 hours   Microbiology: Recent Results (from the past 720 hour(s))  URINE CULTURE     Status: None   Collection Time    01/28/13 10:55 PM      Result Value Range Status   Specimen Description URINE, CLEAN CATCH   Final   Special Requests NONE   Final   Culture  Setup Time 01/29/2013 09:26    Final   Colony Count NO GROWTH   Final   Culture NO GROWTH   Final   Report Status 01/30/2013 FINAL   Final    Medical History: Past Medical History  Diagnosis Date  . Diabetes mellitus without complication   . Hypertension   . Anginal pain   . Headache   . Anxiety     h/o anxiety attacks  . Myocardial infarction 2011  . Cancer     lymphoma  . Arthritis     Left hip  . Anemia of renal disease 09/17/2012  . Bleeding     Medications:  Anti-infectives   Start     Dose/Rate Route Frequency Ordered Stop   01/31/13 2245  vancomycin (VANCOCIN) IVPB 750 mg/150 ml premix     750 mg 150 mL/hr over 60 Minutes Intravenous 2 times daily 01/31/13 2241     01/31/13 2245  ceFEPIme (MAXIPIME) 2 g in dextrose 5 % 50 mL IVPB     2 g 100 mL/hr over 30 Minutes Intravenous Every 12 hours 01/31/13 2241     01/31/13 1000  fluconazole (DIFLUCAN) tablet 100 mg     100 mg Oral Daily 01/31/13 0836     01/30/13 1000  levofloxacin (LEVAQUIN) tablet 500 mg  Status:  Discontinued     500 mg Oral Daily 01/30/13 0714 01/30/13 0748  01/30/13 0800  levofloxacin (LEVAQUIN) tablet 500 mg  Status:  Discontinued     500 mg Oral Every 48 hours 01/30/13 0748 01/31/13 2237     Assessment: Patient with neutropenic fever.  Renal function is poor.  Goal of Therapy:  Vancomycin trough level 15-20 mcg/ml Cefepime dosed based on patient weight and renal function   Plan:  Measure antibiotic drug levels at steady state Follow up culture results Vancomycin 750mg  iv q12hr Cefepime 2gm iv q12hr  Aleene Davidson Crowford 01/31/2013,10:47 PM

## 2013-01-31 NOTE — Progress Notes (Signed)
CRITICAL VALUE ALERT  Critical value received:  WBC 0.4, Platelets 55  Date of notification:  01/31/13  Time of notification:    Critical value read back: yes  Nurse who received alert: Kenton Kingfisher  MD notified (1st page): Dr Gaylyn Rong  Time of first page: 0830 - on morning rounds  MD notified (2nd page):  Time of second page:  Responding MD: Dr Gaylyn Rong  Time MD responded: 0830

## 2013-02-01 DIAGNOSIS — D709 Neutropenia, unspecified: Secondary | ICD-10-CM

## 2013-02-01 DIAGNOSIS — R109 Unspecified abdominal pain: Secondary | ICD-10-CM

## 2013-02-01 LAB — CBC
HCT: 24.1 % — ABNORMAL LOW (ref 36.0–46.0)
Hemoglobin: 8.1 g/dL — ABNORMAL LOW (ref 12.0–15.0)
MCHC: 33.6 g/dL (ref 30.0–36.0)
MCV: 77.2 fL — ABNORMAL LOW (ref 78.0–100.0)
RDW: 19 % — ABNORMAL HIGH (ref 11.5–15.5)
WBC: 0.4 10*3/uL — CL (ref 4.0–10.5)

## 2013-02-01 LAB — BASIC METABOLIC PANEL
BUN: 31 mg/dL — ABNORMAL HIGH (ref 6–23)
Chloride: 104 mEq/L (ref 96–112)
Creatinine, Ser: 2.47 mg/dL — ABNORMAL HIGH (ref 0.50–1.10)
GFR calc Af Amer: 22 mL/min — ABNORMAL LOW (ref >90)
Glucose, Bld: 174 mg/dL — ABNORMAL HIGH (ref 70–99)
Potassium: 3.6 mEq/L (ref 3.5–5.1)

## 2013-02-01 LAB — GLUCOSE, CAPILLARY
Glucose-Capillary: 149 mg/dL — ABNORMAL HIGH (ref 70–99)
Glucose-Capillary: 176 mg/dL — ABNORMAL HIGH (ref 70–99)
Glucose-Capillary: 148 mg/dL — ABNORMAL HIGH (ref 70–99)

## 2013-02-01 MED ORDER — ALUM & MAG HYDROXIDE-SIMETH 200-200-20 MG/5ML PO SUSP
30.0000 mL | ORAL | Status: DC | PRN
  Administered 2013-02-01 – 2013-02-02 (×2): 30 mL via ORAL
  Filled 2013-02-01 (×3): qty 30

## 2013-02-01 NOTE — Progress Notes (Signed)
[name] is mildly confused.  Attempting to get out of bed, calling out to get out of here.  Reorients easily but also forgets quickly.  Moved to room by nursing station for safety.  Bed alarm remains on.

## 2013-02-01 NOTE — Progress Notes (Signed)
Family very upset pt was given Oxycontin for pain. States she can not tolerate it, yesterday they couldn't evan wake her up. Explained, if she has been in pain at home she probably wasn't sleeping good. Yesterday with her pain controlled she was probably catching up on her sleep. Dr. Welton Flakes called and came in to see pt. She explained extensively to the family what the pt. Was experiencing. Pt remained alert and oriented after receiving her oxycontin. This AM.

## 2013-02-01 NOTE — Progress Notes (Signed)
Call received from lab to report platelet count of 28 this morning.

## 2013-02-01 NOTE — Progress Notes (Signed)
[facility] INPATIENT PROGRESS NOTE  Name: [name]      MRN: [id number]    Location: 1345/1345-01  Date: 02/01/2013 Time:12:10 PM   Subjective: Interval History:[name] is still complaining of abdominal pain. However it is much improved since being on OxyContin. She is taking Dilaudid for breakthrough. Her family is very concerned because of her somnolence yesterday. That was her first dose of OxyContin. She is much more awake and alert. I spent a long time with them discussing the rationale for using a long-acting pain medication in her situation. She did not complain of any nausea or vomiting. She is afebrile.   Vital signs in last 24 hours: Temp:  [98.9 F (37.2 C)-100.5 F (38.1 C)] 99.3 F (37.4 C) (04/06 0655) Pulse Rate:  [64-74] 64 (04/06 0931) Resp:  [16-20] 20 (04/06 0655) BP: (131-165)/(55-62) 131/55 mmHg (04/06 0931) SpO2:  [98 %-99 %] 99 % (04/06 0655)     PHYSICAL EXAM:  Gen: tired appearing woman, in no acute distress. Eyes: No scleral icterus or jaundice. ENT: There was oral thrush.  Neck was supple without thyromegaly. Lymphatics: Negative for cervical, supraclavicular, axillary, or inguinal adenopathy.  Respiratory: Lungs were clear bilaterally without wheezing or crackles. Cardiovascular: normal heart rate and rhythm; S1/S2; without murmur, rubs, or gallop. There was no pedal edema. GI: Abdomen was soft, flat, nontender, nondistended, without organomegaly. Musculoskeletal exam: No spinal tenderness on palpation of vertebral spine. Skin exam was without ecchymosis, petechiae. Neuro exam was nonfocal. Patient was alert and oriented. Attention was good. Language was appropriate. Mood was normal without depression. Speech was not pressured. Thought content was not tangential.     Studies/Results: Results for orders placed during the hospital encounter of 01/28/13 (from the past 48 hour(s))  GLUCOSE, CAPILLARY     Status: Abnormal   Collection  Time    01/30/13  8:43 PM      Result Value Range   Glucose-Capillary 208 (*) 70 - 99 mg/dL   Comment 1 Notify RN    GLUCOSE, CAPILLARY     Status: Abnormal   Collection Time    01/31/13 12:10 AM      Result Value Range   Glucose-Capillary 183 (*) 70 - 99 mg/dL  CBC     Status: Abnormal   Collection Time    01/31/13  6:00 AM      Result Value Range   WBC 0.4 (*) 4.0 - 10.5 K/uL   Comment: REPEATED TO VERIFY     CRITICAL RESULT CALLED TO, READ BACK BY AND VERIFIED WITH:     M.MILLS RN AT [id number] ON [id number] BY C.BONGEL   RBC 3.70 (*) 3.87 - 5.11 MIL/uL   Hemoglobin 9.9 (*) 12.0 - 15.0 g/dL   HCT 84.6 (*) 96.2 - 95.2 %   MCV 77.0 (*) 78.0 - 100.0 fL   MCH 26.8  26.0 - 34.0 pg   MCHC 34.7  30.0 - 36.0 g/dL   RDW 84.1 (*) 32.4 - 40.1 %   Platelets 55 (*) 150 - 400 K/uL   Comment: DELTA CHECK NOTED     SPECIMEN CHECKED FOR CLOTS     REPEATED TO VERIFY     PLATELET COUNT CONFIRMED BY SMEAR     RESULT CALLED TO, READ BACK BY AND VERIFIED WITH:     M.MILLS RN AT 0272 ON [id number] BY C.BONGEL  BASIC METABOLIC PANEL     Status: Abnormal   Collection Time    01/31/13  6:00 AM      Result Value Range   Sodium 134 (*) 135 - 145 mEq/L   Potassium 2.9 (*) 3.5 - 5.1 mEq/L   Chloride 103  96 - 112 mEq/L   CO2 22  19 - 32 mEq/L   Glucose, Bld 189 (*) 70 - 99 mg/dL   BUN 27 (*) 6 - 23 mg/dL   Creatinine, Ser 4.54 (*) 0.50 - 1.10 mg/dL   Calcium 8.7  8.4 - 09.8 mg/dL   GFR calc non Af Amer 22 (*) >90 mL/min   GFR calc Af Amer 25 (*) >90 mL/min   Comment:            The eGFR has been calculated     using the CKD EPI equation.     This calculation has not been     validated in all clinical     situations.     eGFR's persistently     <90 mL/min signify     possible Chronic Kidney Disease.  GLUCOSE, CAPILLARY     Status: Abnormal   Collection Time    01/31/13  7:32 AM      Result Value Range   Glucose-Capillary 161 (*) 70 - 99 mg/dL   Comment 1 Notify RN    URINALYSIS, ROUTINE W REFLEX  MICROSCOPIC     Status: Abnormal   Collection Time    01/31/13 10:49 PM      Result Value Range   Color, Urine YELLOW  YELLOW   APPearance CLOUDY (*) CLEAR   Specific Gravity, Urine 1.019  1.005 - 1.030   pH 5.5  5.0 - 8.0   Glucose, UA >1000 (*) NEGATIVE mg/dL   Hgb urine dipstick MODERATE (*) NEGATIVE   Bilirubin Urine NEGATIVE  NEGATIVE   Ketones, ur NEGATIVE  NEGATIVE mg/dL   Protein, ur 119 (*) NEGATIVE mg/dL   Urobilinogen, UA 0.2  0.0 - 1.0 mg/dL   Nitrite NEGATIVE  NEGATIVE   Leukocytes, UA NEGATIVE  NEGATIVE  URINE MICROSCOPIC-ADD ON     Status: Abnormal   Collection Time    01/31/13 10:49 PM      Result Value Range   Squamous Epithelial / LPF FEW (*) RARE   WBC, UA 0-2  <3 WBC/hpf   RBC / HPF 3-6  <3 RBC/hpf   Casts GRANULAR CAST (*) NEGATIVE  CBC     Status: Abnormal   Collection Time    02/01/13  5:50 AM      Result Value Range   WBC 0.4 (*) 4.0 - 10.5 K/uL   Comment: REPEATED TO VERIFY     CRITICAL VALUE NOTED.  VALUE IS CONSISTENT WITH PREVIOUSLY REPORTED AND CALLED VALUE.   RBC 3.12 (*) 3.87 - 5.11 MIL/uL   Hemoglobin 8.1 (*) 12.0 - 15.0 g/dL   Comment: REPEATED TO VERIFY     DELTA CHECK NOTED   HCT 24.1 (*) 36.0 - 46.0 %   MCV 77.2 (*) 78.0 - 100.0 fL   MCH 26.0  26.0 - 34.0 pg   MCHC 33.6  30.0 - 36.0 g/dL   RDW 14.7 (*) 82.9 - 56.2 %   Platelets 28 (*) 150 - 400 K/uL   Comment: SPECIMEN CHECKED FOR CLOTS     REPEATED TO VERIFY     CRITICAL RESULT CALLED TO, READ BACK BY AND VERIFIED WITH:     [facility] RN AT 1308 ON [id number] BY DLONG  BASIC METABOLIC PANEL     Status: Abnormal  Collection Time    02/01/13  5:50 AM      Result Value Range   Sodium 132 (*) 135 - 145 mEq/L   Potassium 3.6  3.5 - 5.1 mEq/L   Comment: DELTA CHECK NOTED     REPEATED TO VERIFY     NO VISIBLE HEMOLYSIS   Chloride 104  96 - 112 mEq/L   CO2 20  19 - 32 mEq/L   Glucose, Bld 174 (*) 70 - 99 mg/dL   BUN 31 (*) 6 - 23 mg/dL   Creatinine, Ser 1.61 (*) 0.50 - 1.10 mg/dL    Calcium 8.3 (*) 8.4 - 10.5 mg/dL   GFR calc non Af Amer 19 (*) >90 mL/min   GFR calc Af Amer 22 (*) >90 mL/min   Comment:            The eGFR has been calculated     using the CKD EPI equation.     This calculation has not been     validated in all clinical     situations.     eGFR's persistently     <90 mL/min signify     possible Chronic Kidney Disease.   Dg Chest Port 1 View  01/31/2013  *RADIOLOGY REPORT*  Clinical Data: Fever.  PORTABLE CHEST - 1 VIEW  Comparison: 01/25/2013  Findings: The patient has a right-sided power port, tip to the level of superior vena cava.  Patient is slightly rotated towards the left.  Heart size is upper limits normal.  There has been improvement in the appearance of pulmonary edema.  There are no new focal consolidations or pleural effusions.  Minimal bibasilar atelectasis or scarring is noted.  IMPRESSION:  1.  Improvement in interstitial edema. 2.  No new consolidations.   Original Report Authenticated By: Norva Pavlov, M.D.      MEDICATIONS: reviewed.     PROBLEM LIST:  1.  Recurrent nonHodgkin lymphoma; s/p recent chemo R-ICE. 2.  Pancytopenia from recent chemo. 3.  Abdominal pain from most likely recurrent lymphoma and neutropenia. 4.  Nausea from recent chemo. 5.  Hypertension from presumed pain. 6.  Generalized decondition. 7.  Hypokalemia.  8.  Acute renal insufficiency from most likely poor PO intake.  9.  Moderate calorie protein malnutrition.  10. Diabetes mellitus.     PLAN:  1 continue OxyContin for  pain.  2 continue antibiotics for neutropenia.  3 she is encouraged to be up and out of bed.  4 which is encouraged to increase her by mouth intake.   Drue Second, MD Medical/Oncology [facility] [phone] (beeper) [phone] (Office)  02/01/2013, 12:14 PM

## 2013-02-02 DIAGNOSIS — J811 Chronic pulmonary edema: Secondary | ICD-10-CM

## 2013-02-02 LAB — BASIC METABOLIC PANEL
Calcium: 7.8 mg/dL — ABNORMAL LOW (ref 8.4–10.5)
Creatinine, Ser: 2.76 mg/dL — ABNORMAL HIGH (ref 0.50–1.10)
GFR calc Af Amer: 19 mL/min — ABNORMAL LOW (ref >90)
GFR calc non Af Amer: 16 mL/min — ABNORMAL LOW (ref >90)

## 2013-02-02 LAB — URINE CULTURE: Colony Count: NO GROWTH

## 2013-02-02 LAB — CBC
MCH: 26.7 pg (ref 26.0–34.0)
MCHC: 34.7 g/dL (ref 30.0–36.0)
MCV: 76.9 fL — ABNORMAL LOW (ref 78.0–100.0)
Platelets: 9 10*3/uL — CL (ref 150–400)
RDW: 19.3 % — ABNORMAL HIGH (ref 11.5–15.5)
WBC: 0.3 10*3/uL — CL (ref 4.0–10.5)

## 2013-02-02 LAB — GLUCOSE, CAPILLARY
Glucose-Capillary: 207 mg/dL — ABNORMAL HIGH (ref 70–99)
Glucose-Capillary: 94 mg/dL (ref 70–99)

## 2013-02-02 LAB — VANCOMYCIN, TROUGH: Vancomycin Tr: 17.4 ug/mL (ref 10.0–20.0)

## 2013-02-02 MED ORDER — DEXTROSE 5 % IV SOLN
2.0000 g | INTRAVENOUS | Status: DC
  Administered 2013-02-02: 2 g via INTRAVENOUS
  Filled 2013-02-02: qty 2

## 2013-02-02 MED ORDER — PANTOPRAZOLE SODIUM 40 MG PO TBEC
40.0000 mg | DELAYED_RELEASE_TABLET | Freq: Every day | ORAL | Status: DC
  Administered 2013-02-02 – 2013-02-13 (×12): 40 mg via ORAL
  Filled 2013-02-02 (×14): qty 1

## 2013-02-02 MED ORDER — ACETAMINOPHEN 325 MG PO TABS
650.0000 mg | ORAL_TABLET | Freq: Once | ORAL | Status: AC
  Administered 2013-02-02: 650 mg via ORAL
  Filled 2013-02-02: qty 2

## 2013-02-02 MED ORDER — FUROSEMIDE 10 MG/ML IJ SOLN
10.0000 mg | Freq: Once | INTRAMUSCULAR | Status: AC
  Administered 2013-02-02: 10 mg via INTRAVENOUS
  Filled 2013-02-02: qty 1

## 2013-02-02 MED ORDER — DIPHENHYDRAMINE HCL 25 MG PO CAPS
25.0000 mg | ORAL_CAPSULE | Freq: Once | ORAL | Status: AC
  Administered 2013-02-02: 25 mg via ORAL
  Filled 2013-02-02: qty 1

## 2013-02-02 MED ORDER — VANCOMYCIN HCL IN DEXTROSE 1-5 GM/200ML-% IV SOLN
1000.0000 mg | INTRAVENOUS | Status: DC
  Administered 2013-02-02: 1000 mg via INTRAVENOUS
  Filled 2013-02-02: qty 200

## 2013-02-02 MED ORDER — AMLODIPINE BESYLATE 5 MG PO TABS
5.0000 mg | ORAL_TABLET | Freq: Every day | ORAL | Status: DC
  Administered 2013-02-02 – 2013-02-03 (×2): 5 mg via ORAL
  Filled 2013-02-02 (×2): qty 1

## 2013-02-02 NOTE — Progress Notes (Signed)
[name] feels a little better despite the marked pancytopenia from her chemotherapy. I. she is on OxyContin for abdominal pain. We may need to watch for this and adjust the dose if necessary.  There is no bleeding. She is on a antibiotics. She's had no fever. A chest x-ray from April 5 showed improvement in pulmonary edema.  There's no diarrhea. Her appetite is not that great. She's not vomiting.  She is out of bed a little bit.  On her physical exam, her blood pressure is 110/69. Pulse is 60 temperature is 90.5. Oral exam does show some herpetic-type lesions on her lips. There is no intraoral mucositis. There is no adenopathy in her neck. Lungs are clear bilaterally. Cardiac exam regular rate and rhythm with no murmurs rubs or or bruits. Abdominal exam soft with good bowel sounds. There is no fluid wave. There is no guarding or rebound tenderness. There is no palpable hepatosplenomegaly. Extremities shows no clubbing cyanosis or edema. Neurological exam no focal neurological deficits. Skin exam does show some scattered petechia.  With her labs , white cell count is 0.3. Hemoglobin 6.7. Platelet count 9. Her BUN and creatinine are 34 and 2.76. Glucose is 177.  Again, we'll transfuse her. She should be at her nadir for the blood counts secondary to her chemotherapy.  Her kidney function does appear to be less. This will need to be watched closely.  She's had tough time with this chemotherapy despite a dosage reduction.  I suspect she likely will be in the hospital most of the week. A  Pete E.  Isaiah 55:11

## 2013-02-02 NOTE — Progress Notes (Signed)
ANTIBIOTIC CONSULT NOTE - FOLLOW UP  Pharmacy Consult for Vancomycin Indication: febrile neutropenia  Allergies  Allergen Reactions  . Advicor (Niacin-Lovastatin Er)     Muscle cramps  . Crestor (Rosuvastatin)     Muscle cramps  . Lipitor (Atorvastatin)     Muscle cramps  . Lovastatin     Muscle cramps  . Morphine And Related Other (See Comments)    "made me crazy"  . Niaspan (Niacin Er)     Muscle cramps  . Nsaids     Gi side effects  . Pravachol (Pravastatin Sodium)     Gi side effects  . Sulfa Antibiotics Other (See Comments)    Can't remember   . Vytorin (Ezetimibe-Simvastatin) Nausea Only  . Welchol (Colesevelam Hcl)     Muscle cramps  . Zetia (Ezetimibe)     Muscle cramps   . Zocor (Simvastatin)     Muscle cramps     Patient Measurements: Height: 5\' 2"  (157.5 cm) Weight: 160 lb 8 oz (72.802 kg) IBW/kg (Calculated) : 50.1  Vital Signs: Temp: 98 F (36.7 C) (04/07 1545) Temp src: Oral (04/07 1545) BP: 118/71 mmHg (04/07 1545) Pulse Rate: 60 (04/07 1545) Intake/Output from previous day: 04/06 0701 - 04/07 0700 In: 2776.3 [P.O.:600; I.V.:1776.3; IV Piggyback:400] Out: 600 [Urine:600] Intake/Output from this shift: Total I/O In: 250 [I.V.:250] Out: 550 [Urine:550]  Labs:  Recent Labs  01/31/13 0600 02/01/13 0550 02/02/13 0540  WBC 0.4* 0.4* 0.3*  HGB 9.9* 8.1* 6.7*  PLT 55* 28* 9*  CREATININE 2.16* 2.47* 2.76*   Estimated Creatinine Clearance: 17.7 ml/min (by C-G formula based on Cr of 2.76).  Recent Labs  02/02/13 1425  VANCOTROUGH 17.4     Microbiology: Recent Results (from the past 720 hour(s))  URINE CULTURE     Status: None   Collection Time    01/28/13 10:55 PM      Result Value Range Status   Specimen Description URINE, CLEAN CATCH   Final   Special Requests NONE   Final   Culture  Setup Time 01/29/2013 09:26   Final   Colony Count NO GROWTH   Final   Culture NO GROWTH   Final   Report Status 01/30/2013 FINAL   Final   URINE CULTURE     Status: None   Collection Time    01/31/13 10:50 PM      Result Value Range Status   Specimen Description URINE, RANDOM   Final   Special Requests NONE   Final   Culture  Setup Time 02/01/2013 03:34   Final   Colony Count NO GROWTH   Final   Culture NO GROWTH   Final   Report Status 02/02/2013 FINAL   Final  CULTURE, BLOOD (ROUTINE X 2)     Status: None   Collection Time    02/01/13 12:14 AM      Result Value Range Status   Specimen Description BLOOD RIGHT ARM   Final   Special Requests BOTTLES DRAWN AEROBIC AND ANAEROBIC    Final   Culture  Setup Time 02/01/2013 13:44   Final   Culture     Final   Value:        BLOOD CULTURE RECEIVED NO GROWTH TO DATE CULTURE WILL BE HELD FOR 5 DAYS BEFORE ISSUING A FINAL NEGATIVE REPORT   Report Status PENDING   Incomplete  CULTURE, BLOOD (ROUTINE X 2)     Status: None   Collection Time    02/01/13  12:14 AM      Result Value Range Status   Specimen Description BLOOD  PORT   Final   Special Requests BOTTLES DRAWN AEROBIC AND ANAEROBIC    Final   Culture  Setup Time 02/01/2013 13:44   Final   Culture     Final   Value:        BLOOD CULTURE RECEIVED NO GROWTH TO DATE CULTURE WILL BE HELD FOR 5 DAYS BEFORE ISSUING A FINAL NEGATIVE REPORT   Report Status PENDING   Incomplete    Anti-infectives   Start     Dose/Rate Route Frequency Ordered Stop   02/02/13 2246  ceFEPIme (MAXIPIME) 2 g in dextrose 5 % 50 mL IVPB     2 g 100 mL/hr over 30 Minutes Intravenous Every 24 hours 02/02/13 0723     01/31/13 2245  vancomycin (VANCOCIN) IVPB 750 mg/150 ml premix  Status:  Discontinued     750 mg 150 mL/hr over 60 Minutes Intravenous 2 times daily 01/31/13 2241 02/02/13 0719   01/31/13 2245  ceFEPIme (MAXIPIME) 2 g in dextrose 5 % 50 mL IVPB  Status:  Discontinued     2 g 100 mL/hr over 30 Minutes Intravenous Every 12 hours 01/31/13 2241 02/02/13 0723   01/31/13 1000  fluconazole (DIFLUCAN) tablet 100 mg     100 mg Oral Daily  01/31/13 0836     01/30/13 1000  levofloxacin (LEVAQUIN) tablet 500 mg  Status:  Discontinued     500 mg Oral Daily 01/30/13 0714 01/30/13 0748   01/30/13 0800  levofloxacin (LEVAQUIN) tablet 500 mg  Status:  Discontinued     500 mg Oral Every 48 hours 01/30/13 0748 01/31/13 2237      Assessment: 71 YOF wn Hx NHL, s/p C1 salvage chemo w/ RICE started 01/23/13 admitted 4/2 w/ abdominal pain, nausea and vomiting.  On D# 2 Vancomycin 750mg  IV q12h x neutropenia  D# 3 Cefepime 2g IV q24h. Was on Levaquin 4/4-4/5.  Tm 99.5, highest temp this adm 100.5  WBC trending down, 0.3. D11 s/p chemo (RICE); Neulasta 4/2  Scr trending up: 2.1 --> 2.76, CrCl ~ 17.54ml/min (21ml/min/1.73m2)  Blood and urine cx negative   Vancomycin level is 17.4 - drawn ~ 3 h after true trough. Suspect true trough would have been ~ 20 if not slightly above 20. Because of this and Scr trending up would prefer to empirically decrease vancomycin dose.  Goal of Therapy:  Vancomycin trough level 15-20 mcg/ml  Plan:   Vancomycin 1g IV q24h  Follow vitals, labs, cx  VT @ Css  Adjust dose as necessary  Duration of therapy per MD   Gwen Her PharmD  [phone] 02/02/2013 4:35 PM

## 2013-02-02 NOTE — Progress Notes (Signed)
CRITICAL VALUE ALERT  Critical value received: white cell count of 0.3, Hgb of 6.7, and PLT of 9  Date of notification:  02/02/13  Time of notification:  0630  Critical value read back:yes  Nurse who received alert:  Cindra Eves, RN  MD notified (1st page):  Dr Arlan Organ  Time of first page:  07:05  MD notified (2nd page):  Time of second page:  Responding MD:  Dr Arlan Organ  Time MD responded:  07:05

## 2013-02-03 ENCOUNTER — Inpatient Hospital Stay (HOSPITAL_COMMUNITY): Payer: Medicare Other

## 2013-02-03 ENCOUNTER — Other Ambulatory Visit: Payer: Medicare Other | Admitting: Lab

## 2013-02-03 ENCOUNTER — Ambulatory Visit: Payer: Medicare Other | Admitting: Medical

## 2013-02-03 DIAGNOSIS — F29 Unspecified psychosis not due to a substance or known physiological condition: Secondary | ICD-10-CM

## 2013-02-03 DIAGNOSIS — D702 Other drug-induced agranulocytosis: Secondary | ICD-10-CM

## 2013-02-03 LAB — BASIC METABOLIC PANEL
CO2: 18 mEq/L — ABNORMAL LOW (ref 19–32)
Calcium: 7.9 mg/dL — ABNORMAL LOW (ref 8.4–10.5)
Creatinine, Ser: 3.41 mg/dL — ABNORMAL HIGH (ref 0.50–1.10)
GFR calc non Af Amer: 13 mL/min — ABNORMAL LOW (ref >90)
Glucose, Bld: 138 mg/dL — ABNORMAL HIGH (ref 70–99)

## 2013-02-03 LAB — URINALYSIS, ROUTINE W REFLEX MICROSCOPIC
Bilirubin Urine: NEGATIVE
Glucose, UA: 250 mg/dL — AB
Specific Gravity, Urine: 1.014 (ref 1.005–1.030)

## 2013-02-03 LAB — URINE MICROSCOPIC-ADD ON

## 2013-02-03 LAB — CBC
MCH: 28.2 pg (ref 26.0–34.0)
MCHC: 35.7 g/dL (ref 30.0–36.0)
MCV: 79.1 fL (ref 78.0–100.0)
Platelets: 41 10*3/uL — ABNORMAL LOW (ref 150–400)
RBC: 3.26 MIL/uL — ABNORMAL LOW (ref 3.87–5.11)

## 2013-02-03 LAB — GLUCOSE, CAPILLARY
Glucose-Capillary: 110 mg/dL — ABNORMAL HIGH (ref 70–99)
Glucose-Capillary: 153 mg/dL — ABNORMAL HIGH (ref 70–99)
Glucose-Capillary: 171 mg/dL — ABNORMAL HIGH (ref 70–99)

## 2013-02-03 LAB — PREPARE PLATELET PHERESIS

## 2013-02-03 LAB — TYPE AND SCREEN: Unit division: 0

## 2013-02-03 MED ORDER — BOOST PLUS PO LIQD
237.0000 mL | Freq: Three times a day (TID) | ORAL | Status: DC
  Administered 2013-02-03 – 2013-02-07 (×10): 237 mL via ORAL
  Administered 2013-02-08: 08:00:00 via ORAL
  Administered 2013-02-10: 237 mL via ORAL
  Filled 2013-02-03 (×36): qty 237

## 2013-02-03 MED ORDER — FUROSEMIDE 10 MG/ML IJ SOLN
40.0000 mg | Freq: Once | INTRAMUSCULAR | Status: AC
  Administered 2013-02-03: 40 mg via INTRAVENOUS
  Filled 2013-02-03: qty 4

## 2013-02-03 MED ORDER — FUROSEMIDE 10 MG/ML IJ SOLN: 40.0000 mg | Freq: Once | INTRAMUSCULAR | Status: DC

## 2013-02-03 MED ORDER — SODIUM CHLORIDE 0.9 % IV SOLN
250.0000 mg | Freq: Two times a day (BID) | INTRAVENOUS | Status: DC
  Administered 2013-02-03 – 2013-02-06 (×8): 250 mg via INTRAVENOUS
  Filled 2013-02-03 (×10): qty 250

## 2013-02-03 MED ORDER — LORAZEPAM 1 MG PO TABS
1.0000 mg | ORAL_TABLET | Freq: Once | ORAL | Status: AC
  Administered 2013-02-03: 1 mg via ORAL
  Filled 2013-02-03: qty 1

## 2013-02-03 MED ORDER — METOPROLOL TARTRATE 12.5 MG HALF TABLET
12.5000 mg | ORAL_TABLET | Freq: Two times a day (BID) | ORAL | Status: DC
  Administered 2013-02-03: 12.5 mg via ORAL
  Filled 2013-02-03 (×4): qty 1

## 2013-02-03 NOTE — Progress Notes (Signed)
ANTIBIOTIC CONSULT NOTE - FOLLOW UP  Pharmacy Consult for Primaxin  Indication: febrile neutropenia  Allergies  Allergen Reactions  . Advicor (Niacin-Lovastatin Er)     Muscle cramps  . Crestor (Rosuvastatin)     Muscle cramps  . Lipitor (Atorvastatin)     Muscle cramps  . Lovastatin     Muscle cramps  . Morphine And Related Other (See Comments)    "made me crazy"  . Niaspan (Niacin Er)     Muscle cramps  . Nsaids     Gi side effects  . Pravachol (Pravastatin Sodium)     Gi side effects  . Sulfa Antibiotics Other (See Comments)    Can't remember   . Vytorin (Ezetimibe-Simvastatin) Nausea Only  . Welchol (Colesevelam Hcl)     Muscle cramps  . Zetia (Ezetimibe)     Muscle cramps   . Zocor (Simvastatin)     Muscle cramps     Patient Measurements: Height: 5\' 2"  (157.5 cm) Weight: 172 lb 6.4 oz (78.2 kg) IBW/kg (Calculated) : 50.1  Vital Signs: Temp: 98.1 F (36.7 C) (04/08 0500) Temp src: Oral (04/08 0500) BP: 116/51 mmHg (04/08 0500) Pulse Rate: 93 (04/08 0500) Intake/Output from previous day: 04/07 0701 - 04/08 0700 In: 4739.5 [P.O.:1560; I.V.:1450; Blood:584.5; IV Piggyback:250] Out: 1450 [Urine:1450] Intake/Output from this shift:    Labs:  Recent Labs  02/01/13 0550 02/02/13 0540 02/03/13 0510  WBC 0.4* 0.3* 0.3*  HGB 8.1* 6.7* 9.2*  PLT 28* 9* 41*  CREATININE 2.47* 2.76* 3.41*   Estimated Creatinine Clearance: 14.9 ml/min (by C-G formula based on Cr of 3.41).  Recent Labs  02/02/13 1425  VANCOTROUGH 17.4     Microbiology: Recent Results (from the past 720 hour(s))  URINE CULTURE     Status: None   Collection Time    01/28/13 10:55 PM      Result Value Range Status   Specimen Description URINE, CLEAN CATCH   Final   Special Requests NONE   Final   Culture  Setup Time 01/29/2013 09:26   Final   Colony Count NO GROWTH   Final   Culture NO GROWTH   Final   Report Status 01/30/2013 FINAL   Final  URINE CULTURE     Status: None   Collection Time    01/31/13 10:50 PM      Result Value Range Status   Specimen Description URINE, RANDOM   Final   Special Requests NONE   Final   Culture  Setup Time 02/01/2013 03:34   Final   Colony Count NO GROWTH   Final   Culture NO GROWTH   Final   Report Status 02/02/2013 FINAL   Final  CULTURE, BLOOD (ROUTINE X 2)     Status: None   Collection Time    02/01/13 12:14 AM      Result Value Range Status   Specimen Description BLOOD RIGHT ARM   Final   Special Requests BOTTLES DRAWN AEROBIC AND ANAEROBIC    Final   Culture  Setup Time 02/01/2013 13:44   Final   Culture     Final   Value:        BLOOD CULTURE RECEIVED NO GROWTH TO DATE CULTURE WILL BE HELD FOR 5 DAYS BEFORE ISSUING A FINAL NEGATIVE REPORT   Report Status PENDING   Incomplete  CULTURE, BLOOD (ROUTINE X 2)     Status: None   Collection Time    02/01/13 12:14 AM  Result Value Range Status   Specimen Description BLOOD  PORT   Final   Special Requests BOTTLES DRAWN AEROBIC AND ANAEROBIC    Final   Culture  Setup Time 02/01/2013 13:44   Final   Culture     Final   Value:        BLOOD CULTURE RECEIVED NO GROWTH TO DATE CULTURE WILL BE HELD FOR 5 DAYS BEFORE ISSUING A FINAL NEGATIVE REPORT   Report Status PENDING   Incomplete    Anti-infectives   Start     Dose/Rate Route Frequency Ordered Stop   02/02/13 2246  ceFEPIme (MAXIPIME) 2 g in dextrose 5 % 50 mL IVPB  Status:  Discontinued     2 g 100 mL/hr over 30 Minutes Intravenous Every 24 hours 02/02/13 0723 02/03/13 0731   02/02/13 1800  vancomycin (VANCOCIN) IVPB 1000 mg/200 mL premix  Status:  Discontinued     1,000 mg 200 mL/hr over 60 Minutes Intravenous Every 24 hours 02/02/13 1637 02/03/13 0706   01/31/13 2245  vancomycin (VANCOCIN) IVPB 750 mg/150 ml premix  Status:  Discontinued     750 mg 150 mL/hr over 60 Minutes Intravenous 2 times daily 01/31/13 2241 02/02/13 0719   01/31/13 2245  ceFEPIme (MAXIPIME) 2 g in dextrose 5 % 50 mL IVPB   Status:  Discontinued     2 g 100 mL/hr over 30 Minutes Intravenous Every 12 hours 01/31/13 2241 02/02/13 0723   01/31/13 1000  fluconazole (DIFLUCAN) tablet 100 mg     100 mg Oral Daily 01/31/13 0836     01/30/13 1000  levofloxacin (LEVAQUIN) tablet 500 mg  Status:  Discontinued     500 mg Oral Daily 01/30/13 0714 01/30/13 0748   01/30/13 0800  levofloxacin (LEVAQUIN) tablet 500 mg  Status:  Discontinued     500 mg Oral Every 48 hours 01/30/13 0748 01/31/13 2237      Assessment: 71 YOF wn Hx NHL, s/p C1 salvage chemo w/ RICE started 01/23/13 admitted 4/2 w/ abdominal pain, nausea and vomiting. Today is D#4 total abx for febrile neutropenia - discontinuing vancomycin and cefepime and starting D#1 Primaxin per pharmacy.   WBC trending down, 0.3. D12 s/p chemo (RICE); Neulasta 4/2.  Remains AF  Scr has trended up to 3.41 with CrCl 14 ml/min  Blood and urine cx negative   Goal of Therapy:  Primaxin per renal function   Plan:  1.) Cefepime 250 mg IV Q12h (based on a Scr of 14 ml/min and a weight of 78 kg) 2.) Monitor renal function daily  Xaria Judon, Loma Messing PharmD Pager #: [phone] 7:51 AM 02/03/2013

## 2013-02-03 NOTE — Progress Notes (Signed)
Visited with pet therapy team Jan and Olive. Son also present at time of visit. Patient talked a lot about her little dogs that she misses. She genuinely enjoyed the visit and it was obvious it lifted her spirits. Listening; presence.

## 2013-02-03 NOTE — Progress Notes (Signed)
Mrs. Lill feels ok.  Had episode of confusion last night. No c/o of pain.  Will d/c the Oxycontin. Our problem now is that her kidneys are continue to show more stress. The BUN and creatinine are going up. I will need to get nephrology to see her. I suppose this could be from the chemotherapy that she had. This would be a little unusual as the drug is that she receive really are not that nephrotoxic.  I am stopping her vancomycin. Cultures are negative. She's been afebrile.  She still do not urine. She get 2 units of blood yesterday. I will give her a little bit of Lasix today.  There's been no bleeding. Her platelet count is 41,000 today.  She is out of bed to a chair. Her appetite is pick up a little bit.  On her physical exam blood pressure 116/51. Heart rate 93. Temperature 98.1. Oral cavity shows improving ulcers around her lips. No mucositis is noted. Neck is supple with no adenopathy. Lungs are clear. Cardiac exam regular rate and rhythm with an occasional extra beat. There's no murmurs rubs or bruits. Abdomen is soft. She has good bowel sounds. There is no fluid wave. No hepatosplenomegaly is noted. Extremities shows some trace edema. Neurological exam is intact.  Again, the problem that we are having this her renal function. We'll have to see if nephrology can help [place] out. I will stop the Maxipime and put her on Primaxin. Will have pharmacy dose this for [place]. I'll check a urinalysis.  She is still not ready to go home. She is still neutropenic but at least cultures have been negative.  Lawerance Sabal 1:5-6

## 2013-02-03 NOTE — Consult Note (Signed)
[name] 02/03/2013 [name] D Requesting Physician:  Dr Myna Hidalgo  Reason for Consult:  Acute renal failure HPI: The patient is a 72 y.o. year-old with hx of lymphoma in 2011 treated with chemoRx.  She went into remission.  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  BM bx's were negative.  Patient was admitted on 3/28 and rec'd chemotherapy with RICE (Rituxan, ifosfamide, carboplatin and etoposide) 3/28 > 3/30 and was discharged on 4/1. Readmitted 4/2 with abd pain, N/V and creatinine up at 2.17.  She has rec'd IVF"s with no improvement in creatinine, up to 3.41 today.  UOP is over 1L per day.  She has become cytopenic in the hospital with plts down to 9K, up to 40k today, and WBC is low at 0.3 today.  Patient without complaints. Some jerking of extremities.     ROS  no joint complaints,   no rash  no sob, cp . abd pain  no n/v/d   Past Medical History:  Past Medical History  Diagnosis Date  . Diabetes mellitus without complication   . Hypertension   . Anginal pain   . Headache   . Anxiety     h/o anxiety attacks  . Myocardial infarction 2011  . Cancer     lymphoma  . Arthritis     Left hip  . Anemia of renal disease 09/17/2012  . Bleeding     Past Surgical History:  Past Surgical History  Procedure Laterality Date  . Abdominal hysterectomy      30 yrs. ago  . Appendectomy  2009  . Tonsillectomy      age 70  . Cardiac catheterization  2011  . Rotator cuff repair      Right arm  . Esophagogastroduodenoscopy (egd) with propofol  10/06/2012    Procedure: ESOPHAGOGASTRODUODENOSCOPY (EGD) WITH PROPOFOL;  Surgeon: Willis Modena, MD;  Location: [facility];  Service: Endoscopy;  Laterality: N/A;  . Colonoscopy with propofol  10/06/2012    Procedure: COLONOSCOPY WITH PROPOFOL;  Surgeon: Willis Modena, MD;  Location: [facility];  Service: Endoscopy;  Laterality:  N/A;  . Colonoscopy with propofol N/A 01/14/2013    Procedure: COLONOSCOPY WITH PROPOFOL;  Surgeon: Willis Modena, MD;  Location: [facility];  Service: Endoscopy;  Laterality: N/A;  . Portacath placement Right   . Portacath placement Right 01/22/2013    Family History: History reviewed. No pertinent family history. Social History:  reports that she has quit smoking. She has never used smokeless tobacco. She reports that she does not drink alcohol or use illicit drugs.  Allergies:  Allergies  Allergen Reactions  . Advicor (Niacin-Lovastatin Er)     Muscle cramps  . Crestor (Rosuvastatin)     Muscle cramps  . Lipitor (Atorvastatin)     Muscle cramps  . Lovastatin     Muscle cramps  . Morphine And Related Other (See Comments)    "made me crazy"  . Niaspan (Niacin Er)     Muscle cramps  . Nsaids     Gi side effects  . Pravachol (Pravastatin Sodium)     Gi side effects  . Sulfa Antibiotics Other (See Comments)    Can't remember   . Vytorin (Ezetimibe-Simvastatin) Nausea Only  . Welchol (Colesevelam Hcl)     Muscle cramps  . Zetia (Ezetimibe)     Muscle cramps   . Zocor (Simvastatin)     Muscle cramps  Home medications: Prior to Admission medications   Medication Sig Start Date End Date Taking? Authorizing Provider  cholecalciferol (VITAMIN D) 1000 UNITS tablet Take 2,000 Units by mouth daily.     Yes Historical Provider, MD  dabigatran (PRADAXA) 75 MG CAPS Take 1 capsule (75 mg total) by mouth 2 (two) times daily. 10/07/12  Yes Willis Modena, MD  esomeprazole (NEXIUM) 40 MG capsule Take 40 mg by mouth daily.     Yes Historical Provider, MD  furosemide (LASIX) 40 MG tablet Take 20 mg by mouth every morning.    Yes Historical Provider, MD  glimepiride (AMARYL) 4 MG tablet Take 2 mg by mouth every evening.    Yes Historical Provider, MD  lidocaine-prilocaine (EMLA) cream Apply 1 application topically as needed (for port-a-cath access.).   Yes Historical Provider, MD   lisinopril (PRINIVIL,ZESTRIL) 20 MG tablet Take 10 mg by mouth every morning.    Yes Historical Provider, MD  LORazepam (ATIVAN) 1 MG tablet Take 1 tablet (1 mg total) by mouth every 6 (six) hours as needed (Nausea or vomiting.). 01/27/13  Yes Josph Macho, MD  metoprolol (LOPRESSOR) 50 MG tablet Take 50 mg by mouth 2 (two) times daily.   Yes Historical Provider, MD  prochlorperazine (COMPAZINE) 10 MG tablet Take 10 mg by mouth every 6 (six) hours as needed (for nausea/vomiting.).   Yes Historical Provider, MD  traMADol (ULTRAM) 50 MG tablet Take 50-100 mg by mouth every 6 (six) hours as needed for pain.   Yes Historical Provider, MD  allopurinol (ZYLOPRIM) 100 MG tablet Take 1 tablet (100 mg total) by mouth daily. 01/27/13   Josph Macho, MD  antiseptic oral rinse (BIOTENE) LIQD 15 mLs by Mouth Rinse route 4 (four) times daily. 01/27/13   Josph Macho, MD  docusate sodium 100 MG CAPS Take 200 mg by mouth 3 (three) times daily as needed for constipation. 01/27/13   Josph Macho, MD  levofloxacin (LEVAQUIN) 500 MG tablet Take 1 tablet (500 mg total) by mouth daily. 01/27/13   Josph Macho, MD  sodium bicarbonate/sodium chloride SOLN 15 application by Mouth Rinse route 4 (four) times daily.    Historical Provider, MD    Labs: Basic Metabolic Panel:  Recent Labs Lab 01/28/13 1227 01/28/13 2225 01/29/13 0435 01/30/13 0515 01/31/13 0600 02/01/13 0550 02/02/13 0540 02/03/13 0510  NA 137 136 137 135 134* 132* 132* 132*  K 3.1* 3.2* 3.0* 2.6* 2.9* 3.6 3.7 4.1  CL 101 98 102 102 103 104 103 106  CO2 28 22 25 24 22 20  18* 18*  GLUCOSE 102 352* 142* 139* 189* 174* 177* 138*  BUN 22 31* 31* 26* 27* 31* 34* 45*  CREATININE 1.9* 2.09* 2.17* 2.10* 2.16* 2.47* 2.76* 3.41*  ALB 2.3*  --   --   --   --   --   --   --   CALCIUM 9.4 9.1 9.0 8.5 8.7 8.3* 7.8* 7.9*  PHOS  --  4.7* 4.5  --   --   --   --   --    Liver Function Tests:  Recent Labs Lab 01/28/13 1227 01/28/13 2225  01/29/13 0435  AST 29 23 20   ALT  --  23 19  ALKPHOS 49 66 59  BILITOT 0.60 0.3 0.3  PROT 5.6* 5.7* 5.3*  ALBUMIN  --  2.4* 2.3*   No results found for this basename: LIPASE, AMYLASE,  in the last 168 hours No results found for  this basename: AMMONIA,  in the last 168 hours CBC:  Recent Labs Lab 01/28/13 1219 01/28/13 2225  01/31/13 0600 02/01/13 0550 02/02/13 0540 02/03/13 0510  WBC 14.6* 13.5*  < > 0.4* 0.4* 0.3* 0.3*  NEUTROABS 13.6* 13.0*  --   --   --   --   --   HGB 11.2* 10.9*  < > 9.9* 8.1* 6.7* 9.2*  HCT 33.4* 32.9*  < > 28.5* 24.1* 19.3* 25.8*  MCV 79* 77.8*  < > 77.0* 77.2* 76.9* 79.1  PLT 200 212  < > 55* 28* 9* 41*  < > = values in this interval not displayed. PT/INR: @LABRCNTIP (inr:5) Cardiac Enzymes: )No results found for this basename: CKTOTAL, CKMB, CKMBINDEX, TROPONINI,  in the last 168 hours CBG:  Recent Labs Lab 02/02/13 1157 02/02/13 1658 02/02/13 2031 02/03/13 0724 02/03/13 1153  GLUCAP 207* 94 252* 110* 153*     Physical Exam:  Blood pressure 127/59, pulse 63, temperature 98.2 F (36.8 C), temperature source Oral, resp. rate 18, height 5\' 2"  (1.575 m), weight 78.2 kg (172 lb 6.4 oz), SpO2 100.00%.  Gen: older adult female, no distress Skin: no rash, cyanosis HEENT:  EOMI, sclera anicteric, throat slightly dry Neck: JVP 12 cm Chest: clear bilat , no rales or rhonchi CV: regular, no rub or gallop, no murmur, pedal pulses intact bilat Abdomen: soft, nontender, no mass or HSM, liver down 3 cm, no ascites Ext: no LE edema, no joint effusion or deformity, no gangrene or ulceration Neuro: alert, Ox3, no focal deficit, mild asterixis  Date  Creat  eGFR 2011  0.7-2.1 2012  1.1-1.8 2013  1.4-1.8 Nov 05, 2012 1.38 01/21/13 1.06 3/28  0.94 3/30  0.78 3/31  0.93 01/27/13  1.33 4/2  1.90 4/3  2.17 4/6  2.47 4/8  3.41  49mL/min  CXR 3/26- no acute change CXR 3/30- new pulm edema, diffuse IS CXR 4/5- improved IS edema  CT 12/24/12 +IV  contrast 80 cc CT 01/21/13 >> +IV contrast 100 cc  UA 4/8 >> 30 protein, 0-2 rbc Weight 4/2 > 4/8 = 76 > 78.2kg I/O + 6.3L 4/2 > 4/8  Impression/Plan  1. Acute kidney failure- she got IV contrast in Feb and again on Mar 26th, but creat was stable thereafter. Real bump occurred after chemoRx.  Agree not strongly toxic regimen, but both ifosfamide and carboplatin can cause tubular injury. She was on an ACEI apparently also prior to admission and this may have contributed; it was not continued on admission.  Creatinine rising daily, mildly uremic w some asterixis and mild confusion today.  Volume looks up and she did have CHF on recent cxr on 3/30; will repeat cxr, consider lasix or at least stop IVF's for now.  Suspect she may need dialysis in a day or two if not improving.  Will stop BP meds, BP not high. Insert foley, urine Na/creat, renal US. Will follow 2. HTN- was taking lisinopril and metoprolol at home, on metoprolol and norvasc here now.  Will stop BP meds, let BP come up some 3. Recurrent NHL- s/p first round chemo 3/28-3/30 4. Pancytopenia  Vinson Moselle  MD [facility] [phone] pgr     [phone] cell 02/03/2013, 3:42 PM

## 2013-02-04 ENCOUNTER — Other Ambulatory Visit: Payer: Self-pay

## 2013-02-04 DIAGNOSIS — R112 Nausea with vomiting, unspecified: Secondary | ICD-10-CM

## 2013-02-04 DIAGNOSIS — N179 Acute kidney failure, unspecified: Secondary | ICD-10-CM | POA: Diagnosis present

## 2013-02-04 DIAGNOSIS — R109 Unspecified abdominal pain: Secondary | ICD-10-CM

## 2013-02-04 LAB — MAGNESIUM: Magnesium: 1.6 mg/dL (ref 1.5–2.5)

## 2013-02-04 LAB — CBC
MCH: 28.5 pg (ref 26.0–34.0)
MCHC: 35.8 g/dL (ref 30.0–36.0)
Platelets: 35 10*3/uL — ABNORMAL LOW (ref 150–400)

## 2013-02-04 LAB — MRSA PCR SCREENING: MRSA by PCR: NEGATIVE

## 2013-02-04 LAB — BASIC METABOLIC PANEL
Calcium: 8.4 mg/dL (ref 8.4–10.5)
GFR calc non Af Amer: 10 mL/min — ABNORMAL LOW (ref >90)
Glucose, Bld: 143 mg/dL — ABNORMAL HIGH (ref 70–99)
Sodium: 136 mEq/L (ref 135–145)

## 2013-02-04 LAB — TROPONIN I: Troponin I: <0.3 ng/mL (ref <0.30)

## 2013-02-04 LAB — GLUCOSE, CAPILLARY
Glucose-Capillary: 111 mg/dL — ABNORMAL HIGH (ref 70–99)
Glucose-Capillary: 148 mg/dL — ABNORMAL HIGH (ref 70–99)

## 2013-02-04 MED ORDER — METOPROLOL TARTRATE 25 MG PO TABS
25.0000 mg | ORAL_TABLET | Freq: Two times a day (BID) | ORAL | Status: DC
  Administered 2013-02-04 – 2013-02-06 (×4): 25 mg via ORAL
  Filled 2013-02-04 (×5): qty 1

## 2013-02-04 MED ORDER — HALOPERIDOL LACTATE 5 MG/ML IJ SOLN
2.0000 mg | Freq: Four times a day (QID) | INTRAMUSCULAR | Status: DC | PRN
  Administered 2013-02-04 – 2013-02-05 (×2): 2 mg via INTRAVENOUS
  Filled 2013-02-04 (×2): qty 1

## 2013-02-04 MED ORDER — DILTIAZEM HCL 100 MG IV SOLR
5.0000 mg/h | INTRAVENOUS | Status: AC
  Administered 2013-02-04 (×2): 15 mg/h via INTRAVENOUS
  Administered 2013-02-04 (×2): 10 mg/h via INTRAVENOUS
  Administered 2013-02-05 (×2): 5 mg/h via INTRAVENOUS
  Filled 2013-02-04 (×2): qty 100

## 2013-02-04 MED ORDER — HYDROMORPHONE HCL PF 1 MG/ML IJ SOLN
INTRAMUSCULAR | Status: AC
  Administered 2013-02-04: 0.5 mg via INTRAVENOUS
  Filled 2013-02-04: qty 1

## 2013-02-04 MED ORDER — METOPROLOL TARTRATE 1 MG/ML IV SOLN
5.0000 mg | Freq: Four times a day (QID) | INTRAVENOUS | Status: DC | PRN
  Administered 2013-02-04: 5 mg via INTRAVENOUS
  Filled 2013-02-04 (×2): qty 5

## 2013-02-04 MED ORDER — LORAZEPAM 2 MG/ML IJ SOLN
INTRAMUSCULAR | Status: AC
  Filled 2013-02-04: qty 1

## 2013-02-04 MED ORDER — DILTIAZEM HCL 25 MG/5ML IV SOLN
10.0000 mg | Freq: Once | INTRAVENOUS | Status: AC
  Administered 2013-02-04: 10 mg via INTRAVENOUS
  Filled 2013-02-04: qty 5

## 2013-02-04 NOTE — Progress Notes (Signed)
Pt transferred to [facility] 2603. Report called & given to Morrie Sheldon, RN. Call from [facility] & report given to RN. Pt to [facility] per care link. No changes from AM assessment. Hartley Barefoot

## 2013-02-04 NOTE — Progress Notes (Signed)
Pt woke up around 0130, screaming and moaning out loudly c/o abd pain. Pt repeatedly yelled that staff were demons and trying to kill her. Pt was medicated with 0.5mg  Dilaudid IV as ordered for pain. Dr. Rosie Fate was again notified of pt's change in mental status. Family requested to see MD. Dr. Rosie Fate was asked to come see and evaluate pt for delirium/agitation. No new orders were given. Family remained at bedside. Staff and family were able to reorient patient.

## 2013-02-04 NOTE — Progress Notes (Signed)
Pt heart rate sustained 145 - 160s for more than 5 mins; turned UF off, reduced heart rate to 100 and gave a bolus. Heart rate still high, tx stopped; pt confused; called Dr. Delano Metz; he ordered EKG and 10 mg bolus of cardizem and rapid response team called in.Helle, RN rapid response gave the  Cardizem 10mg  bolus and also started pt on the cardizem drip. Heart rate still high Dr. Arlean Hopping ordered to stop tx and retry later to finish the remaining 2 hr of hemodialysis. All the above was reported to [place], [place] on 2600. Pt accompanied to her room by Council Mechanic, RN Hendricks Milo, RN and Erlinda Hong. Explained the situation to pt's son in her room.

## 2013-02-04 NOTE — H&P (Signed)
[facility]          History and Physical    PCP:   [name], MD   Chief Complaint:  Jerky, "hallucinations" confusion  HPI: 72 year old white woman initially admitted to [facility] hospital on 01/28/13 by Dr. 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 She developed acute renal failure that has worsened to the point where she is uremic today, with increased confusion and jerking movements of her upper and lower extremities. Her creatinine is up to 4.3 from 1.9 on admission. Renal ultrasound without evidence for obstruction or hydronephrosis. She has been followed by Dr. Arlean Hopping who has requested transfer to [facility] today to start dialysis given her uremic symptoms. [facility] was asked to assist with transfer. Multiple family members present at time of my interview including her 3 children.  Allergies:   Allergies  Allergen Reactions  . Advicor (Niacin-Lovastatin Er)     Muscle cramps  . Crestor (Rosuvastatin)     Muscle cramps  . Lipitor (Atorvastatin)     Muscle cramps  . Lovastatin     Muscle cramps  . Morphine And Related Other (See Comments)    "made me crazy"  . Niaspan (Niacin Er)     Muscle cramps  . Nsaids     Gi side effects  . Pravachol (Pravastatin Sodium)     Gi side effects  . Sulfa Antibiotics Other (See Comments)    Can't remember   . Vytorin (Ezetimibe-Simvastatin) Nausea Only  . Welchol (Colesevelam Hcl)     Muscle cramps  . Zetia (Ezetimibe)     Muscle cramps   . Zocor (Simvastatin)     Muscle cramps       Past Medical History  Diagnosis Date  . Diabetes mellitus without complication   . Hypertension   . Anginal pain   . Headache   . Anxiety     h/o anxiety attacks  . Myocardial infarction 2011  . Cancer     lymphoma  . Arthritis     Left hip  . Anemia of renal disease 09/17/2012  . Bleeding     Past Surgical History  Procedure  Laterality Date  . Abdominal hysterectomy      30 yrs. ago  . Appendectomy  2009  . Tonsillectomy      age 4  . Cardiac catheterization  2011  . Rotator cuff repair      Right arm  . Esophagogastroduodenoscopy (egd) with propofol  10/06/2012    Procedure: ESOPHAGOGASTRODUODENOSCOPY (EGD) WITH PROPOFOL;  Surgeon: Willis Modena, MD;  Location: [facility]COPY;  Service: Endoscopy;  Laterality: N/A;  . Colonoscopy with propofol  10/06/2012    Procedure: COLONOSCOPY WITH PROPOFOL;  Surgeon: Willis Modena, MD;  Location: [facility];  Service: Endoscopy;  Laterality: N/A;  . Colonoscopy with propofol N/A 01/14/2013    Procedure: COLONOSCOPY WITH PROPOFOL;  Surgeon: Willis Modena, MD;  Location: [facility];  Service: Endoscopy;  Laterality: N/A;  . Portacath placement Right   . Portacath placement Right 01/22/2013    Prior to Admission medications   Medication Sig Start Date End Date Taking? Authorizing Provider  cholecalciferol (VITAMIN D) 1000 UNITS tablet Take 2,000 Units by mouth daily.     Yes Historical Provider, MD  dabigatran (PRADAXA) 75 MG CAPS Take 1 capsule (75 mg total) by mouth 2 (two) times daily. 10/07/12  Yes Willis Modena, MD  esomeprazole (NEXIUM) 40 MG capsule Take  40 mg by mouth daily.     Yes Historical Provider, MD  furosemide (LASIX) 40 MG tablet Take 20 mg by mouth every morning.    Yes Historical Provider, MD  glimepiride (AMARYL) 4 MG tablet Take 2 mg by mouth every evening.    Yes Historical Provider, MD  lidocaine-prilocaine (EMLA) cream Apply 1 application topically as needed (for port-a-cath access.).   Yes Historical Provider, MD  lisinopril (PRINIVIL,ZESTRIL) 20 MG tablet Take 10 mg by mouth every morning.    Yes Historical Provider, MD  LORazepam (ATIVAN) 1 MG tablet Take 1 tablet (1 mg total) by mouth every 6 (six) hours as needed (Nausea or vomiting.). 01/27/13  Yes Josph Macho, MD  metoprolol (LOPRESSOR) 50 MG tablet Take 50 mg by mouth 2 (two) times  daily.   Yes Historical Provider, MD  prochlorperazine (COMPAZINE) 10 MG tablet Take 10 mg by mouth every 6 (six) hours as needed (for nausea/vomiting.).   Yes Historical Provider, MD  traMADol (ULTRAM) 50 MG tablet Take 50-100 mg by mouth every 6 (six) hours as needed for pain.   Yes Historical Provider, MD  allopurinol (ZYLOPRIM) 100 MG tablet Take 1 tablet (100 mg total) by mouth daily. 01/27/13   Josph Macho, MD  antiseptic oral rinse (BIOTENE) LIQD 15 mLs by Mouth Rinse route 4 (four) times daily. 01/27/13   Josph Macho, MD  docusate sodium 100 MG CAPS Take 200 mg by mouth 3 (three) times daily as needed for constipation. 01/27/13   Josph Macho, MD  levofloxacin (LEVAQUIN) 500 MG tablet Take 1 tablet (500 mg total) by mouth daily. 01/27/13   Josph Macho, MD  sodium bicarbonate/sodium chloride SOLN 15 application by Mouth Rinse route 4 (four) times daily.    Historical Provider, MD    Social History:  reports that she has quit smoking. She has never used smokeless tobacco. She reports that she does not drink alcohol or use illicit drugs.  History reviewed. No pertinent family history.  Review of Systems:  Constitutional: Denies fever, chills, diaphoresis, appetite change and fatigue.  HEENT: Denies photophobia, eye pain, redness, hearing loss, ear pain, congestion, sore throat, rhinorrhea, sneezing, mouth sores, trouble swallowing, neck pain, neck stiffness and tinnitus.   Respiratory: Denies SOB, DOE, cough, chest tightness,  and wheezing.   Cardiovascular: Denies chest pain, palpitations and leg swelling.  Gastrointestinal: Denies diarrhea, constipation, blood in stool and abdominal distention.  Genitourinary: Denies dysuria, urgency, frequency, hematuria, flank pain and difficulty urinating.  Musculoskeletal: Denies myalgias, back pain, joint swelling, arthralgias and gait problem.  Skin: Denies pallor, rash and wound.  Neurological: Denies dizziness, seizures, syncope,  weakness, light-headedness, numbness and headaches.  Hematological: Denies adenopathy. Easy bruising, personal or family bleeding history  Psychiatric/Behavioral: Denies suicidal ideation, mood changes, confusion, nervousness, sleep disturbance and agitation   Physical Exam: Blood pressure 176/78, pulse 94, temperature 99.2 F (37.3 C), temperature source Oral, resp. rate 19, height 5\' 2"  (1.575 m), weight 78.6 kg (173 lb 4.5 oz), SpO2 99.00%. General: Alert, awake, currently oriented x3, complaining of diffuse abdominal pain, mainly related to her right lower quadrant. HEENT: Normocephalic, atraumatic, pupils equal round and reactive to light, intact extraocular movements. Neck: Supple, no JVD, no lymphadenopathy, no bruits, no goiter. Cardiovascular: Regular rate and rhythm, no murmurs, rubs, gallops. Lungs: Clear to auscultation bilaterally. Abdomen: Soft, nondistended, tender to palpation over the right lower and middle quadrant. No rebound or guarding. Extremities: 1+ edema bilaterally, involuntary jerky movements. Neurologic: Nonfocal.  Labs on Admission:  Results for orders placed during the hospital encounter of 01/28/13 (from the past 48 hour(s))  GLUCOSE, CAPILLARY     Status: None   Collection Time    02/02/13  4:58 PM      Result Value Range   Glucose-Capillary 94  70 - 99 mg/dL   Comment 1 Notify RN    GLUCOSE, CAPILLARY     Status: Abnormal   Collection Time    02/02/13  8:31 PM      Result Value Range   Glucose-Capillary 252 (*) 70 - 99 mg/dL  CBC     Status: Abnormal   Collection Time    02/03/13  5:10 AM      Result Value Range   WBC 0.3 (*) 4.0 - 10.5 K/uL   Comment: REPEATED TO VERIFY     CRITICAL VALUE NOTED.  VALUE IS CONSISTENT WITH PREVIOUSLY REPORTED AND CALLED VALUE.   RBC 3.26 (*) 3.87 - 5.11 MIL/uL   Hemoglobin 9.2 (*) 12.0 - 15.0 g/dL   Comment: POST TRANSFUSION SPECIMEN     REPEATED TO VERIFY     DELTA CHECK NOTED   HCT 25.8 (*) 36.0 - 46.0 %    MCV 79.1  78.0 - 100.0 fL   MCH 28.2  26.0 - 34.0 pg   MCHC 35.7  30.0 - 36.0 g/dL   RDW 81.1 (*) 91.4 - 78.2 %   Platelets 41 (*) 150 - 400 K/uL   Comment: POST TRANSFUSION SPECIMEN     REPEATED TO VERIFY     DELTA CHECK NOTED  BASIC METABOLIC PANEL     Status: Abnormal   Collection Time    02/03/13  5:10 AM      Result Value Range   Sodium 132 (*) 135 - 145 mEq/L   Potassium 4.1  3.5 - 5.1 mEq/L   Chloride 106  96 - 112 mEq/L   CO2 18 (*) 19 - 32 mEq/L   Glucose, Bld 138 (*) 70 - 99 mg/dL   BUN 45 (*) 6 - 23 mg/dL   Creatinine, Ser 9.56 (*) 0.50 - 1.10 mg/dL   Calcium 7.9 (*) 8.4 - 10.5 mg/dL   GFR calc non Af Amer 13 (*) >90 mL/min   GFR calc Af Amer 15 (*) >90 mL/min   Comment:            The eGFR has been calculated     using the CKD EPI equation.     This calculation has not been     validated in all clinical     situations.     eGFR's persistently     <90 mL/min signify     possible Chronic Kidney Disease.  GLUCOSE, CAPILLARY     Status: Abnormal   Collection Time    02/03/13  7:24 AM      Result Value Range   Glucose-Capillary 110 (*) 70 - 99 mg/dL   Comment 1 Notify RN    URINALYSIS, ROUTINE W REFLEX MICROSCOPIC     Status: Abnormal   Collection Time    02/03/13  8:31 AM      Result Value Range   Color, Urine YELLOW  YELLOW   APPearance CLEAR  CLEAR   Specific Gravity, Urine 1.014  1.005 - 1.030   pH 5.0  5.0 - 8.0   Glucose, UA 250 (*) NEGATIVE mg/dL   Hgb urine dipstick SMALL (*) NEGATIVE   Bilirubin Urine NEGATIVE  NEGATIVE  Ketones, ur NEGATIVE  NEGATIVE mg/dL   Protein, ur 30 (*) NEGATIVE mg/dL   Urobilinogen, UA 0.2  0.0 - 1.0 mg/dL   Nitrite NEGATIVE  NEGATIVE   Leukocytes, UA NEGATIVE  NEGATIVE  URINE MICROSCOPIC-ADD ON     Status: Abnormal   Collection Time    02/03/13  8:31 AM      Result Value Range   Squamous Epithelial / LPF FEW (*) RARE   RBC / HPF 0-2  <3 RBC/hpf   Urine-Other AMORPHOUS URATES/PHOSPHATES    GLUCOSE, CAPILLARY      Status: Abnormal   Collection Time    02/03/13 11:53 AM      Result Value Range   Glucose-Capillary 153 (*) 70 - 99 mg/dL   Comment 1 Notify RN    GLUCOSE, CAPILLARY     Status: Abnormal   Collection Time    02/03/13  4:57 PM      Result Value Range   Glucose-Capillary 138 (*) 70 - 99 mg/dL  SODIUM, URINE, RANDOM     Status: None   Collection Time    02/03/13  5:05 PM      Result Value Range   Sodium, Ur 105    CREATININE, URINE, RANDOM     Status: None   Collection Time    02/03/13  5:05 PM      Result Value Range   Creatinine, Urine 16.9    GLUCOSE, CAPILLARY     Status: Abnormal   Collection Time    02/03/13 10:05 PM      Result Value Range   Glucose-Capillary 171 (*) 70 - 99 mg/dL   Comment 1 Notify RN    CBC     Status: Abnormal   Collection Time    02/04/13  5:35 AM      Result Value Range   WBC 0.4 (*) 4.0 - 10.5 K/uL   Comment: REPEATED TO VERIFY     CRITICAL VALUE NOTED.  VALUE IS CONSISTENT WITH PREVIOUSLY REPORTED AND CALLED VALUE.   RBC 3.44 (*) 3.87 - 5.11 MIL/uL   Hemoglobin 9.8 (*) 12.0 - 15.0 g/dL   HCT 16.1 (*) 09.6 - 04.5 %   MCV 79.7  78.0 - 100.0 fL   MCH 28.5  26.0 - 34.0 pg   MCHC 35.8  30.0 - 36.0 g/dL   RDW 40.9 (*) 81.1 - 91.4 %   Platelets 35 (*) 150 - 400 K/uL   Comment: CONSISTENT WITH PREVIOUS RESULT  BASIC METABOLIC PANEL     Status: Abnormal   Collection Time    02/04/13  5:35 AM      Result Value Range   Sodium 136  135 - 145 mEq/L   Potassium 5.0  3.5 - 5.1 mEq/L   Comment: NO VISIBLE HEMOLYSIS     DELTA CHECK NOTED   Chloride 109  96 - 112 mEq/L   CO2 17 (*) 19 - 32 mEq/L   Glucose, Bld 143 (*) 70 - 99 mg/dL   BUN 54 (*) 6 - 23 mg/dL   Creatinine, Ser 7.82 (*) 0.50 - 1.10 mg/dL   Calcium 8.4  8.4 - 95.6 mg/dL   GFR calc non Af Amer 10 (*) >90 mL/min   GFR calc Af Amer 11 (*) >90 mL/min   Comment:            The eGFR has been calculated     using the CKD EPI equation.     This calculation has not  been     validated in all  clinical     situations.     eGFR's persistently     <90 mL/min signify     possible Chronic Kidney Disease.  GLUCOSE, CAPILLARY     Status: Abnormal   Collection Time    02/04/13  7:33 AM      Result Value Range   Glucose-Capillary 123 (*) 70 - 99 mg/dL  GLUCOSE, CAPILLARY     Status: Abnormal   Collection Time    02/04/13 11:53 AM      Result Value Range   Glucose-Capillary 109 (*) 70 - 99 mg/dL  MRSA PCR SCREENING     Status: None   Collection Time    02/04/13 12:36 PM      Result Value Range   MRSA by PCR NEGATIVE  NEGATIVE   Comment:            The GeneXpert MRSA Assay (FDA     approved for NASAL specimens     only), is one component of a     comprehensive MRSA colonization     surveillance program. It is not     intended to diagnose MRSA     infection nor to guide or     monitor treatment for     MRSA infections.    Radiological Exams on Admission: US Renal  02/03/2013  *RADIOLOGY REPORT*  Clinical Data: Acute renal failure, lymphoma  RENAL/URINARY TRACT ULTRASOUND COMPLETE  Comparison:  11/1938 03/2013  Findings:  Right Kidney:  12.5 cm length.  Normal cortex and echogenicity. Mildly prominent medullary pyramids.  No hydronephrosis or obstruction.  Left Kidney:  10.7 cm length.  Normal cortex and echogenicity.  No hydronephrosis. Small upper pole renal cyst noted, difficult to visualize by ultrasound but present on the prior CT.  Bladder:  Decompressed by a Foley catheter.  IMPRESSION: No acute finding or hydronephrosis.   Original Report Authenticated By: Judie [name]. Miles Costain, M.D.    Dg Chest Port 1 View  02/03/2013  *RADIOLOGY REPORT*  Clinical Data: Follow up of pulmonary edema.  History of hypertension and myocardial infarction.  PORTABLE CHEST - 1 VIEW  Comparison: 01/31/2013  Findings: Right-sided Port-A-Cath terminates at the low SVC. Midline trachea.  Borderline cardiomegaly.  Possible small left pleural effusion. No pneumothorax.  Pulmonary interstitial prominence is slightly  increased.  Patchy left base air space disease is also progressive.  IMPRESSION: Increase in pulmonary interstitial prominence, suspicious for mild pulmonary venous congestion.  Probable small left pleural effusion with slight increase in adjacent atelectasis.   Original Report Authenticated By: Jeronimo Greaves, M.D.     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 -Was also on an ACE inhibitor preadmission. -Renal ultrasound without evidence for hydronephrosis or obstruction. -Is starting to develop uremic symptoms that are progressive; renal has requested transfer to [facility] for initiation of dialysis treatment. -Management as per renal.  Abdominal pain -Oncology believes related to her lymphoma. -Pain meds as needed.  Pancytopenia -Related to lymphoma and chemotherapy. -As per oncology.  Diabetes mellitus -Moderate control.  -Continue sliding scale insulin.  Time Spent on Admission: 75 minutes  HERNANDEZ ACOSTA,ESTELA [facility] Pager: [phone] 02/04/2013, 3:06 PM

## 2013-02-04 NOTE — Progress Notes (Signed)
Paged Ardyth Harps, MD regarding pt HR still in the 160s despite bumping cardizem drip up to 15mg /hr.  Orders received to give po metoprolol 25mg  bid (including now) plus IV metoprolol 5mg  q6hrs PRN for HR >130.  Will continue to monitor.  Salomon Mast, RN

## 2013-02-04 NOTE — Procedures (Signed)
Under sterile conditions a 20 cm R femoral Trialysis catheter was placed using US guidance. On second pass wire was passed w/o difficulty, tissue dilated and catheter passed into position. Good blood return all ports, lines flushed. No heparin instilled since catheter will be used directly. Pt tolerated procedure reasonable well.   Vinson Moselle  MD [phone] pgr    [phone] cell 02/04/2013, 5:08 PM

## 2013-02-04 NOTE — Progress Notes (Signed)
About one hour into dialysis pt went into tachyarythmmia abruptly.  HR 150's-160's, afib on EKG with RBBB and questionable inferior ST depression.  Pt feels "bad" but no specific complaints, no CP or SOB and BP stable at 160/90.  Took her off the HD machine and gave IV diltiazem 10 mg with brief drop in HR to 120's then back up again. Discussed with Dr Ardyth Harps, will move back to SDU and she will write for diltiazem drip.  Would like to resume HD last this evening if HR is under reasonable control.    Vinson Moselle  MD [phone] pgr    [phone] cell 02/04/2013, 5:11 PM

## 2013-02-04 NOTE — Significant Event (Signed)
Rapid Response Event Note  Overview: Time Called: 1640 Arrival Time: 1648 Event Type: Cardiac  Initial Focused Assessment: Patient in Hemodialysis with new HD cath and 1st HD treatment this afternoon. Per RN completed about 1 hour. HR 140-160s, A Fib BP 160-180/80-90,  RR 16-20  O2 sat 98% Temp 98.6 Patient denies, CP or SOB, confused and anxious, but alert and oriented 12 lead EKG done  Interventions: 1705  10 mg Cardizem given IVP, brief response HR 120 then return to 160s 1730  Cardizem infusion started at 10mg /hr and 2nd 10mg  bolus given via bag. Patient transported back to 2603 via bed with heart monitor and O2.  Family at bedside upon arrival, updated on patient status.  Asked family about history of irregular heart beat or palpitations, daughter stated that patient would have "racing heart" . She did not if patient takes medication for irregular heart beat. RN updated on patient status, RN to call if assistance needed.  Event Summary: Name of Physician Notified: DR Arlean Hopping at 1640  Name of Consulting Physician Notified: Dr Ardyth Harps at 1650  Outcome: Stayed in room and stabalized (returned to 2600)  Event End Time: 1755  Marcellina Millin

## 2013-02-04 NOTE — Progress Notes (Signed)
Pt began to have what appeared to be a panic attack. Pt has a hx of anxiety attacks, but had nothing on PTA med list for anxiety. Dr. Rosie Fate, on call MD, notified and gave order for 1mg  PO Ativan x1. Family at bedside presently, attempting to reassure patient and reorient patient.

## 2013-02-04 NOTE — Progress Notes (Signed)
Received a phone call from Dr. Arlean Hopping. About 1 hour into her dialysis session she developed rapid atrial fibrillation with rates in the 150s 160s. She is symptomatic with some palpitations and dizziness. He has already given diltiazem 10 mg IV push. I will write orders for a diltiazem drip as well as for some lab work. We'll continue to follow.  Peggye Pitt, MD [facility] Pager: [phone]

## 2013-02-04 NOTE — Progress Notes (Signed)
Patient ID: [name], female   DOB: 1940/11/18, 72 y.o.   MRN: [id number]   Called by nurse about 1:45 am secondary to patient slightly confused.   Patient seen and examined by me.   Patient's family at bedside.  Patient was confused to place and person.  She received ativan at around 10pm secondary to anxiety and received dilaudid 0.5 x one at around 1am secondary to abdominal pain.  Patient was non-combative and laying in bed.  PE: Vs per webcis.  Plan: 1) Delirium secondary to medications versus metabolic (though renal function appears to be approving) vs. sundowning.  She is not hypoxic, and remains afebrile.  Continue gentle re-direction.  Avoid new medications presently.  Monitor vital signs per floor.  Pharmacy to review medications in am.

## 2013-02-04 NOTE — Progress Notes (Signed)
Pt arrived from [facility] via [facility] with daugther at bedside.  Report received from [place], [place].  Pt alert and orientedx4, pleasant.

## 2013-02-04 NOTE — Progress Notes (Addendum)
Subjective: Pt is more confused today, jerking. UOP about 1000 cc without diuretics  Objective Vital signs in last 24 hours: Filed Vitals:   02/03/13 0500 02/03/13 1400 02/03/13 2156 02/04/13 0551  BP: 116/51 127/59 145/71 150/65  Pulse: 93 63 87 97  Temp: 98.1 F (36.7 C) 98.2 F (36.8 C) 99.8 F (37.7 C)   TempSrc: Oral Oral Oral   Resp: 18 18 18 16   Height:      Weight: 78.2 kg (172 lb 6.4 oz)     SpO2: 99% 100% 99% 98%   Weight change:   Intake/Output Summary (Last 24 hours) at 02/04/13 0905 Last data filed at 02/04/13 0600  Gross per 24 hour  Intake   2112 ml  Output    700 ml  Net   1412 ml   Labs: Basic Metabolic Panel:  Recent Labs Lab 01/28/13 1227  01/28/13 2225 01/29/13 0435  02/01/13 0550 02/02/13 0540 02/03/13 0510 02/04/13 0535  NA 137  < > 136 137  < > 132* 132* 132* 136  K 3.1*  < > 3.2* 3.0*  < > 3.6 3.7 4.1 5.0  CL 101  < > 98 102  < > 104 103 106 109  CO2 28  < > 22 25  < > 20 18* 18* 17*  GLUCOSE 102  < > 352* 142*  < > 174* 177* 138* 143*  BUN 22  < > 31* 31*  < > 31* 34* 45* 54*  CREATININE 1.9*  < > 2.09* 2.17*  < > 2.47* 2.76* 3.41* 4.30*  ALB 2.3*  --   --   --   --   --   --   --   --   CALCIUM 9.4  < > 9.1 9.0  < > 8.3* 7.8* 7.9* 8.4  PHOS  --   --  4.7* 4.5  --   --   --   --   --   < > = values in this interval not displayed. Liver Function Tests:  Recent Labs Lab 01/28/13 1227 01/28/13 2225 01/29/13 0435  AST 29 23 20   ALT  --  23 19  ALKPHOS 49 66 59  BILITOT 0.60 0.3 0.3  PROT 5.6* 5.7* 5.3*  ALBUMIN  --  2.4* 2.3*   No results found for this basename: LIPASE, AMYLASE,  in the last 168 hours No results found for this basename: AMMONIA,  in the last 168 hours CBC:  Recent Labs Lab 01/28/13 1219 01/28/13 2225  02/01/13 0550 02/02/13 0540 02/03/13 0510 02/04/13 0535  WBC 14.6* 13.5*  < > 0.4* 0.3* 0.3* 0.4*  NEUTROABS 13.6* 13.0*  --   --   --   --   --   HGB 11.2* 10.9*  < > 8.1* 6.7* 9.2* 9.8*  HCT 33.4*  32.9*  < > 24.1* 19.3* 25.8* 27.4*  MCV 79* 77.8*  < > 77.2* 76.9* 79.1 79.7  PLT 200 212  < > 28* 9* 41* 35*  < > = values in this interval not displayed. PT/INR: @LABRCNTIP (inr:5)   Scheduled Meds ) . antiseptic oral rinse  15 mL Mouth Rinse QID  . fluconazole  100 mg Oral Daily  . imipenem-cilastatin  250 mg Intravenous Q12H  . insulin aspart  0-15 Units Subcutaneous TID WC  . lactose free nutrition  237 mL Oral TID WC  . metoprolol  12.5 mg Oral BID  . pantoprazole  40 mg Oral Daily    Physical  Exam:  Blood pressure 150/65, pulse 97, temperature 99.8 F (37.7 C), temperature source Oral, resp. rate 16, height 5\' 2"  (1.575 m), weight 78.2 kg (172 lb 6.4 oz), SpO2 98.00%.  Gen: older adult female, lethargic today, +myoclonic jerking Skin: no rash, cyanosis  HEENT: EOMI, sclera anicteric, throat slightly dry  Neck: JVP 12 cm  Chest: clear bilat , no rales or rhonchi  CV: regular, no rub or gallop, no murmur, pedal pulses intact bilat  Abdomen: soft, nontender, no mass or HSM, liver down 3 cm, no ascites  Ext: no LE edema, no joint effusion or deformity, no gangrene or ulceration  Neuro: alert, Ox3, no focal deficit, mild asterixis   Date   Creat   Other 2011   0.7-2.1  2012   1.1-1.8  2013   1.4-1.8  11/05/12   1.38  01/21/13  1.06  3/31   0.93  Chemo    01/27/13   1.33  4/3   2.17  4/6   2.47  4/8   3.41   eGFR 65mL/min  CXR 3/26- no acute change  CXR 3/30- new pulm edema, diffuse IS  CXR 4/5- improved IS edema  CT 12/24/12 +IV contrast 80 cc  CT 01/21/13 >> +IV contrast 100 cc   UA 4/8 >> 30 protein, 0-2 rbc Urine Na high 105, UCr 17  Impression/Plan  1. Acute kidney injury- suspect ATN due to chemoRx (also was on outpt ACEI), no other cause noted. Nonoliguric. Progressive uremic symptoms, will need dialysis today at [facility].  Transfer orders are written. I've discussed with DrsMyna Hidalgo and Lavera Guise. 2. HTN- on metoprolol only. Let BP run on high side 3. Volume- 2kg up  from admission, no gross volume excess, making urine. No fluid off with HD today 4. 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 Pancytopenia- due to Otilio Jefferson  MD [phone] pgr    [phone] cell 02/04/2013, 9:05 AM

## 2013-02-04 NOTE — Progress Notes (Signed)
Mrs. Lausch is declining steadily. Her renal function is going down. I very much appreciate Dr.Schertz of nephrology seen her. I have to agree that this must be medication induced renal failure. It is very unusual for kidney problems to a rise with her chemotherapy protocol but I cannot feel any other medication that she has been given that can do this.  She is quite confused last night. There is still some confusion this morning. We will need to readjust her medications. We'll keep her on Ativan.  She's not eating much. There's no obvious pain. She's had no nausea vomiting.  Is no bleeding. She had a renal ultrasound which was essentially negative. There is no hydronephrosis.. She is off all nephrotoxic medications I can think of. Her BUN is 54 creatinine 4.3. Glucose is okay at 143. Potassium is 5.  She still pancytopenic. White cell count 0.4 and platelet count 35.  I have to think that her kidney function will improve once her immune status improves.  I am  very distressed over her decline and how poorly she has tolerated this chemotherapy. I sincerely doubt that we'll be able to treat her again unless she makes a significant recovery.  On her physical exam her temperature is 99.8. Pulse 97 blood pressure 150/65. She is a good oxygen saturation. Oral exam does show some slight mucositis. There is a herpetic lesions around her lips. There is no adenopathy in her neck. Lungs are clear with good breath sounds bilaterally. Cardiac exam regular and rhythm with no murmurs rubs or bruits. Abdominal exam soft. Bowel sounds are decreased. There is no obvious guarding or rebound tenderness. Extremities shows some trace edema. Neurological exam shows no focal neurological deficits. She does have some jerking motions which could be secondary to her elevated BUN.  Again, we have to follow her day by day. I think if her renal function continues to deteriorate, then dialysis probably would be indicated. 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   We will continue to make adjustments.  Pete E.  Psalms 91:14-16

## 2013-02-05 DIAGNOSIS — N170 Acute kidney failure with tubular necrosis: Secondary | ICD-10-CM | POA: Diagnosis present

## 2013-02-05 DIAGNOSIS — R630 Anorexia: Secondary | ICD-10-CM | POA: Diagnosis present

## 2013-02-05 DIAGNOSIS — D61810 Antineoplastic chemotherapy induced pancytopenia: Secondary | ICD-10-CM | POA: Diagnosis present

## 2013-02-05 DIAGNOSIS — N182 Chronic kidney disease, stage 2 (mild): Secondary | ICD-10-CM | POA: Diagnosis present

## 2013-02-05 DIAGNOSIS — C8589 Other specified types of non-Hodgkin lymphoma, extranodal and solid organ sites: Secondary | ICD-10-CM

## 2013-02-05 DIAGNOSIS — N179 Acute kidney failure, unspecified: Secondary | ICD-10-CM

## 2013-02-05 DIAGNOSIS — D696 Thrombocytopenia, unspecified: Secondary | ICD-10-CM

## 2013-02-05 DIAGNOSIS — I4891 Unspecified atrial fibrillation: Secondary | ICD-10-CM

## 2013-02-05 DIAGNOSIS — K5903 Drug induced constipation: Secondary | ICD-10-CM

## 2013-02-05 LAB — GLUCOSE, CAPILLARY
Glucose-Capillary: 120 mg/dL — ABNORMAL HIGH (ref 70–99)
Glucose-Capillary: 184 mg/dL — ABNORMAL HIGH (ref 70–99)
Glucose-Capillary: 156 mg/dL — ABNORMAL HIGH (ref 70–99)
Glucose-Capillary: 119 mg/dL — ABNORMAL HIGH (ref 70–99)

## 2013-02-05 LAB — BASIC METABOLIC PANEL
CO2: 20 mEq/L (ref 19–32)
Calcium: 8.5 mg/dL (ref 8.4–10.5)
Chloride: 106 mEq/L (ref 96–112)
Chloride: 103 mEq/L (ref 96–112)
Creatinine, Ser: 4.05 mg/dL — ABNORMAL HIGH (ref 0.50–1.10)
Creatinine, Ser: 3.6 mg/dL — ABNORMAL HIGH (ref 0.50–1.10)
GFR calc Af Amer: 12 mL/min — ABNORMAL LOW (ref >90)
GFR calc Af Amer: 14 mL/min — ABNORMAL LOW (ref >90)
GFR calc non Af Amer: 12 mL/min — ABNORMAL LOW (ref >90)
Sodium: 135 mEq/L (ref 135–145)

## 2013-02-05 LAB — CBC
Platelets: 14 10*3/uL — CL (ref 150–400)
RBC: 3.38 MIL/uL — ABNORMAL LOW (ref 3.87–5.11)
RDW: 19.7 % — ABNORMAL HIGH (ref 11.5–15.5)
WBC: 0.3 10*3/uL — CL (ref 4.0–10.5)

## 2013-02-05 LAB — TYPE AND SCREEN: ABO/RH(D): A POS

## 2013-02-05 LAB — TROPONIN I: Troponin I: <0.3 ng/mL (ref <0.30)

## 2013-02-05 LAB — TSH: TSH: 0.906 u[IU]/mL (ref 0.350–4.500)

## 2013-02-05 LAB — ABO/RH: ABO/RH(D): A POS

## 2013-02-05 LAB — HEPATITIS B SURFACE ANTIBODY,QUALITATIVE: Hep B S Ab: NONREACTIVE

## 2013-02-05 MED ORDER — LIDOCAINE-PRILOCAINE 2.5-2.5 % EX CREA
1.0000 "application " | TOPICAL_CREAM | CUTANEOUS | Status: DC | PRN
  Filled 2013-02-05: qty 5

## 2013-02-05 MED ORDER — SODIUM CHLORIDE 0.9 % IV SOLN: 100.0000 mL | INTRAVENOUS | Status: DC | PRN

## 2013-02-05 MED ORDER — DOCUSATE SODIUM 100 MG PO CAPS
100.0000 mg | ORAL_CAPSULE | Freq: Two times a day (BID) | ORAL | Status: DC
  Administered 2013-02-05 – 2013-02-13 (×16): 100 mg via ORAL
  Filled 2013-02-05 (×19): qty 1

## 2013-02-05 MED ORDER — DILTIAZEM HCL 30 MG PO TABS
30.0000 mg | ORAL_TABLET | Freq: Four times a day (QID) | ORAL | Status: DC
  Administered 2013-02-05 – 2013-02-07 (×7): 30 mg via ORAL
  Filled 2013-02-05 (×11): qty 1

## 2013-02-05 MED ORDER — ALTEPLASE 2 MG IJ SOLR
2.0000 mg | Freq: Once | INTRAMUSCULAR | Status: AC | PRN
  Filled 2013-02-05: qty 2

## 2013-02-05 MED ORDER — ALTEPLASE 2 MG IJ SOLR: 2.0000 mg | Freq: Once | INTRAMUSCULAR | Status: DC | PRN

## 2013-02-05 MED ORDER — LIDOCAINE HCL (PF) 1 % IJ SOLN: 5.0000 mL | INTRAMUSCULAR | Status: DC | PRN

## 2013-02-05 MED ORDER — PENTAFLUOROPROP-TETRAFLUOROETH EX AERO: 1.0000 "application " | INHALATION_SPRAY | CUTANEOUS | Status: DC | PRN

## 2013-02-05 MED ORDER — FILGRASTIM 480 MCG/1.6ML IJ SOLN
480.0000 ug | Freq: Every day | INTRAMUSCULAR | Status: DC
  Administered 2013-02-06 – 2013-02-08 (×3): 480 ug via SUBCUTANEOUS
  Filled 2013-02-05 (×6): qty 1.6

## 2013-02-05 MED ORDER — HEPARIN SODIUM (PORCINE) 1000 UNIT/ML DIALYSIS
1000.0000 [IU] | INTRAMUSCULAR | Status: DC | PRN
  Filled 2013-02-05: qty 1

## 2013-02-05 MED ORDER — POLYETHYLENE GLYCOL 3350 17 G PO PACK
17.0000 g | PACK | Freq: Every day | ORAL | Status: DC
  Administered 2013-02-05 – 2013-02-10 (×5): 17 g via ORAL
  Filled 2013-02-05 (×8): qty 1

## 2013-02-05 MED ORDER — LIDOCAINE-PRILOCAINE 2.5-2.5 % EX CREA: 1.0000 "application " | TOPICAL_CREAM | CUTANEOUS | Status: DC | PRN

## 2013-02-05 MED ORDER — HEPARIN SODIUM (PORCINE) 1000 UNIT/ML DIALYSIS: 1000.0000 [IU] | INTRAMUSCULAR | Status: DC | PRN

## 2013-02-05 MED ORDER — ACETAMINOPHEN 325 MG PO TABS
650.0000 mg | ORAL_TABLET | Freq: Once | ORAL | Status: AC
  Administered 2013-02-05: 650 mg via ORAL
  Filled 2013-02-05: qty 2

## 2013-02-05 MED ORDER — NEPRO/CARBSTEADY PO LIQD: 237.0000 mL | ORAL | Status: DC | PRN

## 2013-02-05 MED ORDER — LORAZEPAM 2 MG/ML IJ SOLN: 0.5000 mg | INTRAMUSCULAR | Status: DC | PRN

## 2013-02-05 MED ORDER — DIPHENHYDRAMINE HCL 25 MG PO CAPS
25.0000 mg | ORAL_CAPSULE | Freq: Once | ORAL | Status: AC
  Administered 2013-02-05: 25 mg via ORAL
  Filled 2013-02-05: qty 1

## 2013-02-05 NOTE — Progress Notes (Signed)
CRITICAL VALUE ALERT  Critical value received:  WBC: 0.3, PLT: 14  Date of notification:  02/05/2013  Time of notification:  0408  Critical value read back:yes  Nurse who received alert:  Fransisco Hertz, RN   MD notified (1st page):  Donnamarie Poag, NP  Time of first page:  [phone]  MD notified (2nd page):  Time of second page:  Responding MD:    Time MD responded:

## 2013-02-05 NOTE — Progress Notes (Signed)
[facility] Progress Note [place] TEAM 1 - Stepdown/ICU TEAM   MARICE GUIDONE ZOX:[id number] DOB: 07-29-1941 DOA: 01/28/2013 PCP: [name], MD  Brief narrative:  72 year old white woman initially admitted to [facility] on 01/28/13 by Dr. 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 She developed acute renal failure that has worsened to the point where she was uremic, with increased confusion and jerking movements of her upper and lower extremities. Her creatinine was up to 4.3 from 1.9 on admission. Renal ultrasound without evidence for obstruction or hydronephrosis. She has been followed by Dr. Arlean Hopping who has requested transfer to [facility] to (4/9) to start dialysis given her uremic symptoms. [facility] was asked to assist with transfer. Multiple family members present at time of admitting MD interview including her 3 children.   Assessment/Plan: Active Problems:   ARF (acute renal failure) on CKD (chronic kidney disease) stage 2, GFR 60-89 ml/min/ATN (acute tubular necrosis) -started HD this admit via femoral access -HD for 4/9 interrupted by sx AF/RVR but has tolerated at bedside much better today (4/10) -encouraging is making urine (2050 cc past 24 hrs) -ATN from chemorxn    Atrial fibrillation with RVR -was on Pradaxa pre admit but was dc'd due to pancytopenia/low platelets -RVR during 1st HD tx -required CCB gtt but now in NSR so have transitioned to po CCB    Large cell (diffuse) non-Hodgkin's lymphoma/  Antineoplastic chemotherapy induced pancytopenia -per [facility] -Plan is for transfuse platelets today and begin Neupogen (4/10) -cont empiric Primaxin/Diflucan- has low grade fever but no definitive source of infection/urine and blood cx's NGTD    Type II or unspecified type diabetes mellitus with ketoacidosis, controlled -CBG's controlled during admit with SSI -was on Amaryl pre  admit    History of CAD/HTN -Pradaxa on hold -cont BB at 1/2 home dose esp with introduction of CCB -intolerant to statins -ACE I on hold due to soft BP    Nausea and vomiting/Anorexia -due to recent chemo tx and underlying uremia- better but still with poor intake    Abdominal  pain, other specified site -exam benign and no s/s acute abdomen/pain intermittent and worse w. insp effort in LUQ    Constipation due to pain medication -add colace and Miralax   DVT prophylaxis: SCDs-unable to proceed with pharmacological prophylaxis despite underlying malignancy secondary to pancytopenia Code Status: Full Family Communication: Patient and daughter Disposition Plan: Step Isolation: Reverse isolation/neutropenic precautions  Consultants: Nephrology Oncology  Procedures: Right femoral dialysis catheter placement with ultrasound guidance  (4/9)  Antibiotics: Primaxin 4/8 >>> Diflucan 4/5 >>> Maxipime 4/5 >>> 4/8  HPI/Subjective: Patient awake and alert and endorses feels much better than yesterday. Still has poor appetite. Denies chest pain. Complaining of intermittent left side/left upper quadrant abdominal pain that is very sharp in nature and is worse with respiratory effort and turning toward the left side.   Objective: Blood pressure 157/58, pulse 84, temperature 98.9 F (37.2 C), temperature source Oral, resp. rate 15, height 5\' 2"  (1.575 m), weight 78.6 kg (173 lb 4.5 oz), SpO2 100.00%.  Intake/Output Summary (Last 24 hours) at 02/05/13 1226 Last data filed at 02/05/13 1036  Gross per 24 hour  Intake 839.92 ml  Output    664 ml  Net 175.92 ml     Exam: General: No acute respiratory distress Lungs: Clear to auscultation bilaterally without wheezes and a few fine bibasilar crackles, RA Cardiovascular: Regular rate  and rhythm without murmur gallop or rub normal S1 and S2, no peripheral edema or JVD, IV Cardizem at 5 mg per hour Abdomen: Mild tenderness in RLQ,  nondistended, soft, bowel sounds positive, no rebound, no ascites, no appreciable mass Renal: Dialysis catheter right groin Musculoskeletal: No significant cyanosis, clubbing of bilateral lower extremities Neurological: Alert and oriented x 3, moves all extremities x 4 without focal neurological deficits, CN 2-13 intact  Data Reviewed: Basic Metabolic Panel:  Recent Labs Lab 02/01/13 0550 02/02/13 0540 02/03/13 0510 02/04/13 0535 02/04/13 1730 02/05/13 0014  NA 132* 132* 132* 136  --  135  K 3.6 3.7 4.1 5.0  --  4.7  CL 104 103 106 109  --  106  CO2 20 18* 18* 17*  --  20  GLUCOSE 174* 177* 138* 143*  --  155*  BUN 31* 34* 45* 54*  --  44*  CREATININE 2.47* 2.76* 3.41* 4.30*  --  4.05*  CALCIUM 8.3* 7.8* 7.9* 8.4  --  8.2*  MG  --   --   --   --  1.6  --    Liver Function Tests: No results found for this basename: AST, ALT, ALKPHOS, BILITOT, PROT, ALBUMIN,  in the last 168 hours No results found for this basename: LIPASE, AMYLASE,  in the last 168 hours No results found for this basename: AMMONIA,  in the last 168 hours CBC:  Recent Labs Lab 02/01/13 0550 02/02/13 0540 02/03/13 0510 02/04/13 0535 02/05/13 0014  WBC 0.4* 0.3* 0.3* 0.4* 0.3*  HGB 8.1* 6.7* 9.2* 9.8* 9.4*  HCT 24.1* 19.3* 25.8* 27.4* 26.5*  MCV 77.2* 76.9* 79.1 79.7 78.4  PLT 28* 9* 41* 35* 14*   Cardiac Enzymes:  Recent Labs Lab 02/04/13 1752 02/05/13 0014 02/05/13 0506  TROPONINI <0.30 <0.30 <0.30   BNP (last 3 results) No results found for this basename: PROBNP,  in the last 8760 hours CBG:  Recent Labs Lab 02/04/13 0733 02/04/13 1153 02/04/13 1748 02/04/13 2124 02/05/13 0823  GLUCAP 123* 109* 111* 148* 120*    Recent Results (from the past 240 hour(s))  URINE CULTURE     Status: None   Collection Time    01/28/13 10:55 PM      Result Value Range Status   Specimen Description URINE, CLEAN CATCH   Final   Special Requests NONE   Final   Culture  Setup Time 01/29/2013 09:26    Final   Colony Count NO GROWTH   Final   Culture NO GROWTH   Final   Report Status 01/30/2013 FINAL   Final  URINE CULTURE     Status: None   Collection Time    01/31/13 10:50 PM      Result Value Range Status   Specimen Description URINE, RANDOM   Final   Special Requests NONE   Final   Culture  Setup Time 02/01/2013 03:34   Final   Colony Count NO GROWTH   Final   Culture NO GROWTH   Final   Report Status 02/02/2013 FINAL   Final  CULTURE, BLOOD (ROUTINE X 2)     Status: None   Collection Time    02/01/13 12:14 AM      Result Value Range Status   Specimen Description BLOOD RIGHT ARM   Final   Special Requests BOTTLES DRAWN AEROBIC AND ANAEROBIC    Final   Culture  Setup Time 02/01/2013 13:44   Final   Culture  Final   Value:        BLOOD CULTURE RECEIVED NO GROWTH TO DATE CULTURE WILL BE HELD FOR 5 DAYS BEFORE ISSUING A FINAL NEGATIVE REPORT   Report Status PENDING   Incomplete  CULTURE, BLOOD (ROUTINE X 2)     Status: None   Collection Time    02/01/13 12:14 AM      Result Value Range Status   Specimen Description BLOOD  PORT   Final   Special Requests BOTTLES DRAWN AEROBIC AND ANAEROBIC    Final   Culture  Setup Time 02/01/2013 13:44   Final   Culture     Final   Value:        BLOOD CULTURE RECEIVED NO GROWTH TO DATE CULTURE WILL BE HELD FOR 5 DAYS BEFORE ISSUING A FINAL NEGATIVE REPORT   Report Status PENDING   Incomplete  MRSA PCR SCREENING     Status: None   Collection Time    02/04/13 12:36 PM      Result Value Range Status   MRSA by PCR NEGATIVE  NEGATIVE Final   Comment:            The GeneXpert MRSA Assay (FDA     approved for NASAL specimens     only), is one component of a     comprehensive MRSA colonization     surveillance program. It is not     intended to diagnose MRSA     infection nor to guide or     monitor treatment for     MRSA infections.     Studies:  Recent x-ray studies have been reviewed in detail by the Attending  Physician  Scheduled Meds:  Reviewed in detail by the Attending Physician   Junious Silk, ANP [facility] Office  [phone] Pager [phone]  On-Call/Text Page:      [email or web address]      password [email or web address]  If 7PM-7AM, please contact night-coverage [email or web address] Password [facility] 02/05/2013, 12:26 PM   LOS: 8 days   I have examined the patient, reviewed the chart and modified the above note which I agree with.   Diana Armijo,MD [phone] 02/05/2013, 4:15 PM

## 2013-02-05 NOTE — Progress Notes (Signed)
Noted pt having multiple 1second pauses and one 2.08sec pause. HR ranging between 70-120bpm. BP 103/70. Cardizem gtt now at 38ml/hr.  Donnamarie Poag, NP updated. Continue to monitor.

## 2013-02-05 NOTE — Progress Notes (Signed)
Subjective: Much more alert today after 1st HD. Still jerking of extremities, no SOB or cough  Objective Vital signs in last 24 hours: Filed Vitals:   02/05/13 1100 02/05/13 1200 02/05/13 1300 02/05/13 1315  BP: 157/58 142/71 148/59 139/52  Pulse: 84 68 65 65  Temp:   98.5 F (36.9 C) 98.6 F (37 C)  TempSrc:   Oral Oral  Resp: 15 16 19 18   Height:      Weight:      SpO2: 100% 100% 97%    Weight change:   Intake/Output Summary (Last 24 hours) at 02/05/13 1357 Last data filed at 02/05/13 1315  Gross per 24 hour  Intake 1692.42 ml  Output    414 ml  Net 1278.42 ml   Labs: Basic Metabolic Panel:  Recent Labs Lab 02/02/13 0540 02/03/13 0510 02/04/13 0535 02/05/13 0014  NA 132* 132* 136 135  K 3.7 4.1 5.0 4.7  CL 103 106 109 106  CO2 18* 18* 17* 20  GLUCOSE 177* 138* 143* 155*  BUN 34* 45* 54* 44*  CREATININE 2.76* 3.41* 4.30* 4.05*  CALCIUM 7.8* 7.9* 8.4 8.2*   Liver Function Tests: No results found for this basename: AST, ALT, ALKPHOS, BILITOT, PROT, ALBUMIN,  in the last 168 hours No results found for this basename: LIPASE, AMYLASE,  in the last 168 hours No results found for this basename: AMMONIA,  in the last 168 hours CBC:  Recent Labs Lab 02/02/13 0540 02/03/13 0510 02/04/13 0535 02/05/13 0014  WBC 0.3* 0.3* 0.4* 0.3*  HGB 6.7* 9.2* 9.8* 9.4*  HCT 19.3* 25.8* 27.4* 26.5*  MCV 76.9* 79.1 79.7 78.4  PLT 9* 41* 35* 14*   PT/INR: @LABRCNTIP (inr:5)   Scheduled Meds ) . antiseptic oral rinse  15 mL Mouth Rinse QID  . diltiazem  30 mg Oral Q6H  . docusate sodium  100 mg Oral BID  . filgrastim  480 mcg Subcutaneous Daily  . fluconazole  100 mg Oral Daily  . imipenem-cilastatin  250 mg Intravenous Q12H  . insulin aspart  0-15 Units Subcutaneous TID WC  . lactose free nutrition  237 mL Oral TID WC  . metoprolol tartrate  25 mg Oral BID  . pantoprazole  40 mg Oral Daily  . polyethylene glycol  17 g Oral Daily    Physical Exam:  Blood pressure  139/52, pulse 65, temperature 98.6 F (37 C), temperature source Oral, resp. rate 18, height 5\' 2"  (1.575 m), weight 78.6 kg (173 lb 4.5 oz), SpO2 97.00%.  Gen: more alert, no distress, talkative Skin: no rash, cyanosis  HEENT: EOMI, sclera anicteric, throat slightly dry  Neck: JVP 12 cm  Chest: clear bilat , no rales or rhonchi  CV: regular, no rub or gallop, no murmur, pedal pulses intact bilat  Abdomen: soft, nontender, no mass or HSM, liver down 3 cm, no ascites  Ext: no LE edema, no joint effusion or deformity, no gangrene or ulceration  Neuro: alert, Ox3, no focal deficit, mild asterixis   Date   Creat   Other 2011   0.7-2.1  2012   1.1-1.8  2013   1.4-1.8  11/05/12   1.38  01/21/13  1.06  3/31   0.93  Chemo    01/27/13   1.33  4/3   2.17  4/6   2.47  4/8   3.41   eGFR 44mL/min  CXR 3/26- no acute change  CXR 3/30- new pulm edema, diffuse IS  CXR 4/5- improved IS  edema  CT 12/24/12 +IV contrast 80 cc  CT 01/21/13 >> +IV contrast 100 cc   UA 4/8 >> 30 protein, 0-2 rbc Urine Na high 105, UCr 17  Impression/Plan  1. Acute kidney injury- suspect ATN due to chemoRx (also was on outpt ACEI), no other cause noted. Nonoliguric. HD started 4/9 yesterday for jerking and AMS. MS better, jerking persisting. Plan HD in am.  In the meantime, prn Ativan may helpful for asterixis 2. AMS- getting a lot of dilaudid, would prefer holding narcotics for now, will d/c 3. HTN- on metoprolol only. Let BP run on high side 4. Volume- 2kg up from admission, no gross volume excess, making urine 5. Recurrent NHL- s/p first round chemo 3/28-3/30 6. Pancytopenia- due to chemoRx. Getting IV plts today    Diana Moselle  MD [phone] pgr    [phone] cell 02/05/2013, 1:57 PM

## 2013-02-05 NOTE — Progress Notes (Signed)
I am greatly appreciative for all the excellent care that she is receiving over at [facility].  She is getting dialysis. She is low but lethargic this morning. She hasn't how well last night and her daughter says she slept pretty much all might which is nice.  There's been no bleeding. Her platelet count 14,000. I will give her a unit of platelets.  I want to put her on some Neupogen. Maybe we can get her immune system better, then she will improve.  Her cardiac status is in rapid A. fib. She is on a diltiazem drip. She is off Pradaxa so because of her thrombocytopenia and her renal failure.  She's not eating much at all. I'm not too worried about this aspect right now.  Her blood pressure is 128/81. Heart rate is about 140. Temperature is 99.2. She is on Primaxin. Is being dosed by pharmacy.  There is no thrush oral cavity. No adenopathy noted in her neck. Lungs are with some decrease at the bases. No wheezes are noted. Cardiac exam is tachycardic and irregular. She has no murmurs rubs or bruits. Abdomen is soft. There is no fluid wave. Extremities shows some trace edema in her legs. Neurological exam shows no focal neurological deficits.  For now, she will continue dialysis. We will start her on some Neupogen. We will transfuse with 1 unit of platelets. If she can get through this, I just do not see her been able to take any further chemotherapy and that we will focus on quality of life issues and comfort measures. Her daughter agrees with this.  Pete E.  Romans 5:3-5

## 2013-02-06 DIAGNOSIS — D61810 Antineoplastic chemotherapy induced pancytopenia: Secondary | ICD-10-CM

## 2013-02-06 LAB — BASIC METABOLIC PANEL
Chloride: 106 mEq/L (ref 96–112)
GFR calc Af Amer: 12 mL/min — ABNORMAL LOW (ref >90)
GFR calc non Af Amer: 10 mL/min — ABNORMAL LOW (ref >90)
Potassium: 3.7 mEq/L (ref 3.5–5.1)
Sodium: 139 mEq/L (ref 135–145)

## 2013-02-06 LAB — GLUCOSE, CAPILLARY

## 2013-02-06 LAB — CBC
MCHC: 35 g/dL (ref 30.0–36.0)
Platelets: 36 10*3/uL — ABNORMAL LOW (ref 150–400)
RDW: 20.1 % — ABNORMAL HIGH (ref 11.5–15.5)
WBC: 1 10*3/uL — CL (ref 4.0–10.5)

## 2013-02-06 LAB — PREPARE PLATELET PHERESIS

## 2013-02-06 MED ORDER — HEPARIN SODIUM (PORCINE) 1000 UNIT/ML DIALYSIS: 1000.0000 [IU] | INTRAMUSCULAR | Status: DC | PRN

## 2013-02-06 MED ORDER — NEPRO/CARBSTEADY PO LIQD: 237.0000 mL | ORAL | Status: DC | PRN

## 2013-02-06 MED ORDER — ALTEPLASE 2 MG IJ SOLR
2.0000 mg | Freq: Once | INTRAMUSCULAR | Status: DC | PRN
  Filled 2013-02-06: qty 2

## 2013-02-06 MED ORDER — SODIUM CHLORIDE 0.9 % IV SOLN: 100.0000 mL | INTRAVENOUS | Status: DC | PRN

## 2013-02-06 MED ORDER — LIDOCAINE-PRILOCAINE 2.5-2.5 % EX CREA: 1.0000 "application " | TOPICAL_CREAM | CUTANEOUS | Status: DC | PRN

## 2013-02-06 MED ORDER — PENTAFLUOROPROP-TETRAFLUOROETH EX AERO: 1.0000 "application " | INHALATION_SPRAY | CUTANEOUS | Status: DC | PRN

## 2013-02-06 MED ORDER — ONDANSETRON HCL 4 MG/2ML IJ SOLN
4.0000 mg | Freq: Four times a day (QID) | INTRAMUSCULAR | Status: DC | PRN
  Administered 2013-02-06 – 2013-02-13 (×8): 4 mg via INTRAVENOUS
  Filled 2013-02-06 (×7): qty 2

## 2013-02-06 MED ORDER — LIDOCAINE HCL (PF) 1 % IJ SOLN: 5.0000 mL | INTRAMUSCULAR | Status: DC | PRN

## 2013-02-06 MED ORDER — METOPROLOL TARTRATE 50 MG PO TABS
50.0000 mg | ORAL_TABLET | Freq: Two times a day (BID) | ORAL | Status: DC
  Administered 2013-02-06 – 2013-02-13 (×13): 50 mg via ORAL
  Filled 2013-02-06 (×17): qty 1

## 2013-02-06 NOTE — Progress Notes (Signed)
Subjective: Alert, talkative, no complaints. Cr 3.6 > 4.1 overnight, good UOP 1000cc  Objective Vital signs in last 24 hours: Filed Vitals:   02/05/13 2200 02/06/13 0043 02/06/13 0404 02/06/13 0815  BP: 154/65 148/68 146/57 170/67  Pulse:  74 75 88  Temp:  98.1 F (36.7 C) 97.9 F (36.6 C) 98.4 F (36.9 C)  TempSrc:  Oral Oral Oral  Resp:  20 8 20   Height:      Weight:   78.6 kg (173 lb 4.5 oz)   SpO2:  92% 94% 97%   Weight change: 1 kg (2 lb 3.3 oz)  Intake/Output Summary (Last 24 hours) at 02/06/13 0929 Last data filed at 02/06/13 0405  Gross per 24 hour  Intake  812.5 ml  Output   1150 ml  Net -337.5 ml   Labs: Basic Metabolic Panel:  Recent Labs Lab 02/04/13 0535 02/05/13 0014 02/05/13 1800 02/06/13 0530  NA 136 135 138 139  K 5.0 4.7 3.8 3.7  CL 109 106 103 106  CO2 17* 20 25 24   GLUCOSE 143* 155* 143* 110*  BUN 54* 44* 38* 47*  CREATININE 4.30* 4.05* 3.60* 4.11*  CALCIUM 8.4 8.2* 8.5 8.4   Liver Function Tests: No results found for this basename: AST, ALT, ALKPHOS, BILITOT, PROT, ALBUMIN,  in the last 168 hours No results found for this basename: LIPASE, AMYLASE,  in the last 168 hours No results found for this basename: AMMONIA,  in the last 168 hours CBC:  Recent Labs Lab 02/03/13 0510 02/04/13 0535 02/05/13 0014 02/06/13 0530  WBC 0.3* 0.4* 0.3* 1.0*  HGB 9.2* 9.8* 9.4* 8.9*  HCT 25.8* 27.4* 26.5* 25.4*  MCV 79.1 79.7 78.4 78.6  PLT 41* 35* 14* 36*   PT/INR: @LABRCNTIP (inr:5)   Scheduled Meds ) . antiseptic oral rinse  15 mL Mouth Rinse QID  . diltiazem  30 mg Oral Q6H  . docusate sodium  100 mg Oral BID  . filgrastim  480 mcg Subcutaneous Daily  . fluconazole  100 mg Oral Daily  . imipenem-cilastatin  250 mg Intravenous Q12H  . insulin aspart  0-15 Units Subcutaneous TID WC  . lactose free nutrition  237 mL Oral TID WC  . metoprolol tartrate  25 mg Oral BID  . pantoprazole  40 mg Oral Daily  . polyethylene glycol  17 g Oral  Daily    Physical Exam:  Blood pressure 170/67, pulse 88, temperature 98.4 F (36.9 C), temperature source Oral, resp. rate 20, height 5\' 2"  (1.575 m), weight 78.6 kg (173 lb 4.5 oz), SpO2 97.00%.  Gen: more alert, no distress, talkative Skin: no rash, cyanosis  HEENT: EOMI, sclera anicteric, throat slightly dry  Neck: JVP 12 cm  Chest: clear bilat , no rales or rhonchi  CV: regular, no rub or gallop, no murmur, pedal pulses intact bilat  Abdomen: soft, nontender, no mass or HSM, liver down 3 cm, no ascites  Ext: no LE edema, no joint effusion or deformity, no gangrene or ulceration  Neuro: alert, Ox3, no focal deficit, mild asterixis   Date   Creat   Other 2011   0.7-2.1  2012   1.1-1.8  2013   1.4-1.8  11/05/12   1.38  01/21/13  1.06  3/31   0.93  Chemo    01/27/13   1.33  4/3   2.17  4/6   2.47  4/8   3.41   eGFR 3mL/min  CXR 3/26- no acute change  CXR 3/30-  new pulm edema, diffuse IS  CXR 4/5- improved IS edema  CT 12/24/12 +IV contrast 80 cc  CT 01/21/13 >> +IV contrast 100 cc   UA 4/8 >> 30 protein, 0-2 rbc Urine Na high 105, UCr 17  Impression/Plan  1. Acute on chronic renal failure- suspect due to chemoRx (also was on outpt ACEI), no other cause noted. Nonoliguric. HD again today (2nd Rx), then will hold and watch over weekend.   2. CKD III, baseline eGFR 50-60 ml/min- sees Dr Lowell Guitar at [facility] 3. AMS- better, dilaudid stopped, would avoid narcotics 4. HTN- on metoprolol only. Let BP run on high side 5. Volume- 2kg up from admission, no gross volume excess, making urine 6. Recurrent NHL- s/p first round chemo 3/28-3/30 7. Pancytopenia- due to Otilio Jefferson  MD [phone] pgr    [phone] cell 02/06/2013, 9:29 AM

## 2013-02-06 NOTE — Progress Notes (Signed)
[facility] Progress Note [facility] TEAM 1 - Stepdown/ICU TEAM   [name] YNW:[id number] DOB: 17-Jan-1941 DOA: 01/28/2013 PCP: [name], MD  Brief narrative:  72 year old white woman initially admitted to [facility] on 01/28/13 by Dr. 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 She developed acute renal failure that has worsened to the point where she was uremic, with increased confusion and jerking movements of her upper and lower extremities. Her creatinine was up to 4.3 from 1.9 on admission. Renal ultrasound without evidence for obstruction or hydronephrosis. She has been followed by Dr. Arlean Hopping who has requested transfer to [facility] to (4/9) to start dialysis given her uremic symptoms. [facility] was asked to assist with transfer. Multiple family members present at time of admitting MD interview including her 3 children.   Assessment/Plan: Active Problems:   ARF (acute renal failure) on CKD (chronic kidney disease) stage 2, GFR 60-89 ml/min/ATN (acute tubular necrosis) -started HD this admit via femoral access -HD for 4/9 interrupted by sx AF/RVR but has tolerated at bedside much better  (4/10)-plan is to dialyze 4/11 then hold over WE and monitor response - making urine  -ATN from chemorxn    Atrial fibrillation with RVR -was on Pradaxa pre admit but was dc'd due to pancytopenia/low platelets -RVR during 1st HD tx-resolved -required CCB gtt but now in NSR so have transitioned to po CCB/tolerating well    Large cell (diffuse) non-Hodgkin's lymphoma/  Antineoplastic chemotherapy induced pancytopenia -per Ennever -transfused platelets and Neupogen (4/10) -cont empiric Primaxin/Diflucan- has low grade fever but no definitive source of infection/urine and blood cx's NGTD    Type II or unspecified type diabetes mellitus with ketoacidosis, controlled -CBG's controlled during admit with  SSI -was on Amaryl pre admit    History of CAD/HTN -Pradaxa on hold -BB initiated 4/10 at 1/2 home dose but BP up so will increase dose -intolerant to statins -ACE I on hold due to ARF requiring HD- will NOT resume    Nausea and vomiting/Anorexia -due to recent chemo tx and underlying uremia -today (4/11) endorsed very hungry    Abdominal  pain, other specified site -resolved    Constipation due to pain medication -add colace and Miralax   DVT prophylaxis: SCDs-unable to proceed with pharmacological prophylaxis despite underlying malignancy secondary to pancytopenia Code Status: Full Family Communication: Patient and multiple family members Disposition Plan: Transfer to Telemetry Isolation: Reverse isolation/neutropenic precautions  Consultants: Nephrology Oncology  Procedures: Right femoral dialysis catheter placement with ultrasound guidance  (4/9)  Antibiotics: Primaxin 4/8 >>> Diflucan 4/5 >>> Maxipime 4/5 >>> 4/8  HPI/Subjective: Patient awake. Endorsed hunger and feels much better   Objective: Blood pressure 170/67, pulse 88, temperature 98.4 F (36.9 C), temperature source Oral, resp. rate 20, height 5\' 2"  (1.575 m), weight 78.6 kg (173 lb 4.5 oz), SpO2 97.00%.  Intake/Output Summary (Last 24 hours) at 02/06/13 1110 Last data filed at 02/06/13 0405  Gross per 24 hour  Intake  712.5 ml  Output   1150 ml  Net -437.5 ml     Exam: General: No acute respiratory distress Lungs: Clear to auscultation bilaterally without wheezes and a few fine bibasilar crackles, RA Cardiovascular: Regular rate and rhythm without murmur gallop or rub normal S1 and S2, no peripheral edema or JVD Abdomen: Non tender, nondistended, soft, bowel sounds positive, no rebound, no ascites, no appreciable mass Renal: Dialysis catheter right groin Musculoskeletal: No significant  cyanosis, clubbing of bilateral lower extremities Neurological: Alert and oriented x 3, moves all  extremities x 4 without focal neurological deficits, CN 2-13 intact  Data Reviewed: Basic Metabolic Panel:  Recent Labs Lab 02/03/13 0510 02/04/13 0535 02/04/13 1730 02/05/13 0014 02/05/13 1800 02/06/13 0530  NA 132* 136  --  135 138 139  K 4.1 5.0  --  4.7 3.8 3.7  CL 106 109  --  106 103 106  CO2 18* 17*  --  20 25 24   GLUCOSE 138* 143*  --  155* 143* 110*  BUN 45* 54*  --  44* 38* 47*  CREATININE 3.41* 4.30*  --  4.05* 3.60* 4.11*  CALCIUM 7.9* 8.4  --  8.2* 8.5 8.4  MG  --   --  1.6  --   --   --    Liver Function Tests: No results found for this basename: AST, ALT, ALKPHOS, BILITOT, PROT, ALBUMIN,  in the last 168 hours No results found for this basename: LIPASE, AMYLASE,  in the last 168 hours No results found for this basename: AMMONIA,  in the last 168 hours CBC:  Recent Labs Lab 02/02/13 0540 02/03/13 0510 02/04/13 0535 02/05/13 0014 02/06/13 0530  WBC 0.3* 0.3* 0.4* 0.3* 1.0*  HGB 6.7* 9.2* 9.8* 9.4* 8.9*  HCT 19.3* 25.8* 27.4* 26.5* 25.4*  MCV 76.9* 79.1 79.7 78.4 78.6  PLT 9* 41* 35* 14* 36*   Cardiac Enzymes:  Recent Labs Lab 02/04/13 1752 02/05/13 0014 02/05/13 0506  TROPONINI <0.30 <0.30 <0.30   BNP (last 3 results) No results found for this basename: PROBNP,  in the last 8760 hours CBG:  Recent Labs Lab 02/05/13 0823 02/05/13 1220 02/05/13 1802 02/05/13 2027 02/06/13 0817  GLUCAP 120* 184* 156* 119* 96    Recent Results (from the past 240 hour(s))  URINE CULTURE     Status: None   Collection Time    01/28/13 10:55 PM      Result Value Range Status   Specimen Description URINE, CLEAN CATCH   Final   Special Requests NONE   Final   Culture  Setup Time 01/29/2013 09:26   Final   Colony Count NO GROWTH   Final   Culture NO GROWTH   Final   Report Status 01/30/2013 FINAL   Final  URINE CULTURE     Status: None   Collection Time    01/31/13 10:50 PM      Result Value Range Status   Specimen Description URINE, RANDOM   Final    Special Requests NONE   Final   Culture  Setup Time 02/01/2013 03:34   Final   Colony Count NO GROWTH   Final   Culture NO GROWTH   Final   Report Status 02/02/2013 FINAL   Final  CULTURE, BLOOD (ROUTINE X 2)     Status: None   Collection Time    02/01/13 12:14 AM      Result Value Range Status   Specimen Description BLOOD RIGHT ARM   Final   Special Requests BOTTLES DRAWN AEROBIC AND ANAEROBIC    Final   Culture  Setup Time 02/01/2013 13:44   Final   Culture     Final   Value:        BLOOD CULTURE RECEIVED NO GROWTH TO DATE CULTURE WILL BE HELD FOR 5 DAYS BEFORE ISSUING A FINAL NEGATIVE REPORT   Report Status PENDING   Incomplete  CULTURE, BLOOD (ROUTINE X 2)  Status: None   Collection Time    02/01/13 12:14 AM      Result Value Range Status   Specimen Description BLOOD  PORT   Final   Special Requests BOTTLES DRAWN AEROBIC AND ANAEROBIC    Final   Culture  Setup Time 02/01/2013 13:44   Final   Culture     Final   Value:        BLOOD CULTURE RECEIVED NO GROWTH TO DATE CULTURE WILL BE HELD FOR 5 DAYS BEFORE ISSUING A FINAL NEGATIVE REPORT   Report Status PENDING   Incomplete  MRSA PCR SCREENING     Status: None   Collection Time    02/04/13 12:36 PM      Result Value Range Status   MRSA by PCR NEGATIVE  NEGATIVE Final   Comment:            The GeneXpert MRSA Assay (FDA     approved for NASAL specimens     only), is one component of a     comprehensive MRSA colonization     surveillance program. It is not     intended to diagnose MRSA     infection nor to guide or     monitor treatment for     MRSA infections.     Studies:  Recent x-ray studies have been reviewed in detail by the Attending Physician  Scheduled Meds:  Reviewed in detail by the Attending Physician   Junious Silk, ANP [facility] Office  [phone] Pager [phone]  On-Call/Text Page:      [email or web address]      password [email or web address]  If 7PM-7AM, please contact  night-coverage [email or web address] Password [email or web address] 02/06/2013, 11:10 AM   LOS: 9 days   I have examined the patient, reviewed the chart and modified the above note which I agree with.   Elah Avellino,MD [phone] 02/06/2013, 2:56 PM

## 2013-02-06 NOTE — Progress Notes (Signed)
[name] is FINALLY starting to show some improvement. She is more alert. She seemed to sleep well last night. She sounds a lot stronger.  Her renal function is about the same. Her white cell count started to come up. I think that with the improvement of her white cell count, this should help her overall status.   She's not complain of any abdominal pain. She said that she did get nauseated we'll watch on the yesterday.  Is no bleeding. She's had no fever.  For atrial fibrillation is now back to normal sinus rhythm. Heart rate was 80 this morning. She is on diltiazem. Her blood sugars seem to be doing okay.  Again, she just sounds stronger. She looks better.  On her vital signs, temperature 97.9 pulse 75 blood pressure 146/57. Her lungs sound clear bilaterally. Cardiac exam regular rate and rhythm with a normal S1-S2. There are no murmurs rubs or bruits. Abdomen is soft. Bowel sounds are slightly decreased. She has no obvious abdominal mass. Extremities shows some trace edema bilaterally.   she still not making urine. Her urine output is pretty good.  I greatly appreciate all the excellent care that she's gotten all over at [facility] hospital. The staff in the step down unit, the hospitalist and the nephrologists have really made a huge difference and hopefully she will continue to improve. A   Hewitt Shorts  Colossians 3:23

## 2013-02-06 NOTE — Progress Notes (Signed)
Report called to [facility] and pt taken to hemodialysis by staff.

## 2013-02-06 NOTE — Progress Notes (Signed)
Pt arrived to unit from HD. Full assessment deferred to night nurse. Pt resting comfortably in bed with call bell within reach. Family at bedside.

## 2013-02-06 NOTE — Progress Notes (Signed)
ANTIBIOTIC CONSULT NOTE - FOLLOW UP  Pharmacy Consult for Primaxin Indication: febrile neutropenia  Allergies  Allergen Reactions  . Advicor (Niacin-Lovastatin Er)     Muscle cramps  . Crestor (Rosuvastatin)     Muscle cramps  . Lipitor (Atorvastatin)     Muscle cramps  . Lovastatin     Muscle cramps  . Morphine And Related Other (See Comments)    "made me crazy"  . Niaspan (Niacin Er)     Muscle cramps  . Nsaids     Gi side effects  . Pravachol (Pravastatin Sodium)     Gi side effects  . Sulfa Antibiotics Other (See Comments)    Can't remember   . Vytorin (Ezetimibe-Simvastatin) Nausea Only  . Welchol (Colesevelam Hcl)     Muscle cramps  . Zetia (Ezetimibe)     Muscle cramps   . Zocor (Simvastatin)     Muscle cramps     Patient Measurements: Height: 5\' 2"  (157.5 cm) Weight: 173 lb 4.5 oz (78.6 kg) IBW/kg (Calculated) : 50.1  Vital Signs: Temp: 98.4 F (36.9 C) (04/11 0815) Temp src: Oral (04/11 0815) BP: 170/67 mmHg (04/11 0815) Pulse Rate: 88 (04/11 0815) Intake/Output from previous day: 04/10 0701 - 04/11 0700 In: 1077.5 [P.O.:490; I.V.:275; Blood:112.5; IV Piggyback:200] Out: 1150 [Urine:1150] Intake/Output from this shift:    Labs:  Recent Labs  02/03/13 1705  02/04/13 0535 02/05/13 0014 02/05/13 1800 02/06/13 0530  WBC  --   --  0.4* 0.3*  --  1.0*  HGB  --   --  9.8* 9.4*  --  8.9*  PLT  --   --  35* 14*  --  36*  LABCREA 16.9  --   --   --   --   --   CREATININE  --   < > 4.30* 4.05* 3.60* 4.11*  < > = values in this interval not displayed. Estimated Creatinine Clearance: 12.4 ml/min (by C-G formula based on Cr of 4.11). No results found for this basename: VANCOTROUGH, VANCOPEAK, VANCORANDOM, GENTTROUGH, GENTPEAK, GENTRANDOM, TOBRATROUGH, TOBRAPEAK, TOBRARND, AMIKACINPEAK, AMIKACINTROU, AMIKACIN,  in the last 72 hours   Microbiology: Recent Results (from the past 720 hour(s))  URINE CULTURE     Status: None   Collection Time     01/28/13 10:55 PM      Result Value Range Status   Specimen Description URINE, CLEAN CATCH   Final   Special Requests NONE   Final   Culture  Setup Time 01/29/2013 09:26   Final   Colony Count NO GROWTH   Final   Culture NO GROWTH   Final   Report Status 01/30/2013 FINAL   Final  URINE CULTURE     Status: None   Collection Time    01/31/13 10:50 PM      Result Value Range Status   Specimen Description URINE, RANDOM   Final   Special Requests NONE   Final   Culture  Setup Time 02/01/2013 03:34   Final   Colony Count NO GROWTH   Final   Culture NO GROWTH   Final   Report Status 02/02/2013 FINAL   Final  CULTURE, BLOOD (ROUTINE X 2)     Status: None   Collection Time    02/01/13 12:14 AM      Result Value Range Status   Specimen Description BLOOD RIGHT ARM   Final   Special Requests BOTTLES DRAWN AEROBIC AND ANAEROBIC    Final   Culture  Setup Time 02/01/2013 13:44   Final   Culture     Final   Value:        BLOOD CULTURE RECEIVED NO GROWTH TO DATE CULTURE WILL BE HELD FOR 5 DAYS BEFORE ISSUING A FINAL NEGATIVE REPORT   Report Status PENDING   Incomplete  CULTURE, BLOOD (ROUTINE X 2)     Status: None   Collection Time    02/01/13 12:14 AM      Result Value Range Status   Specimen Description BLOOD  PORT   Final   Special Requests BOTTLES DRAWN AEROBIC AND ANAEROBIC    Final   Culture  Setup Time 02/01/2013 13:44   Final   Culture     Final   Value:        BLOOD CULTURE RECEIVED NO GROWTH TO DATE CULTURE WILL BE HELD FOR 5 DAYS BEFORE ISSUING A FINAL NEGATIVE REPORT   Report Status PENDING   Incomplete  MRSA PCR SCREENING     Status: None   Collection Time    02/04/13 12:36 PM      Result Value Range Status   MRSA by PCR NEGATIVE  NEGATIVE Final   Comment:            The GeneXpert MRSA Assay (FDA     approved for NASAL specimens     only), is one component of a     comprehensive MRSA colonization     surveillance program. It is not     intended to diagnose MRSA      infection nor to guide or     monitor treatment for     MRSA infections.    Anti-infectives   Start     Dose/Rate Route Frequency Ordered Stop   02/03/13 0800  imipenem-cilastatin (PRIMAXIN) 250 mg in sodium chloride 0.9 % 100 mL IVPB     250 mg 200 mL/hr over 30 Minutes Intravenous Every 12 hours 02/03/13 0752     02/02/13 2246  ceFEPIme (MAXIPIME) 2 g in dextrose 5 % 50 mL IVPB  Status:  Discontinued     2 g 100 mL/hr over 30 Minutes Intravenous Every 24 hours 02/02/13 0723 02/03/13 0731   02/02/13 1800  vancomycin (VANCOCIN) IVPB 1000 mg/200 mL premix  Status:  Discontinued     1,000 mg 200 mL/hr over 60 Minutes Intravenous Every 24 hours 02/02/13 1637 02/03/13 0706   01/31/13 2245  vancomycin (VANCOCIN) IVPB 750 mg/150 ml premix  Status:  Discontinued     750 mg 150 mL/hr over 60 Minutes Intravenous 2 times daily 01/31/13 2241 02/02/13 0719   01/31/13 2245  ceFEPIme (MAXIPIME) 2 g in dextrose 5 % 50 mL IVPB  Status:  Discontinued     2 g 100 mL/hr over 30 Minutes Intravenous Every 12 hours 01/31/13 2241 02/02/13 0723   01/31/13 1000  fluconazole (DIFLUCAN) tablet 100 mg     100 mg Oral Daily 01/31/13 0836     01/30/13 1000  levofloxacin (LEVAQUIN) tablet 500 mg  Status:  Discontinued     500 mg Oral Daily 01/30/13 0714 01/30/13 0748   01/30/13 0800  levofloxacin (LEVAQUIN) tablet 500 mg  Status:  Discontinued     500 mg Oral Every 48 hours 01/30/13 0748 01/31/13 2237      Assessment: 72 year old female on Day #7 of antibiotic therapy for febrile neutropenia, currently on Primaxin.  She has acute renal failure and is currently requiring support with HD.  Her  Primaxin regimen is appropriately adjusted for her weight and renal function.  Plan:  Continue Primaxin 250mg  IV q12h Monitor renal function and available micro data Monitor WBC recovery  Estella Husk, Pharm.D., BCPS Clinical Pharmacist Phone: [phone] or [phone] Pager: [phone] 02/06/2013, 9:39 AM

## 2013-02-07 DIAGNOSIS — K130 Diseases of lips: Secondary | ICD-10-CM

## 2013-02-07 DIAGNOSIS — T451X5A Adverse effect of antineoplastic and immunosuppressive drugs, initial encounter: Secondary | ICD-10-CM

## 2013-02-07 LAB — CULTURE, BLOOD (ROUTINE X 2): Culture: NO GROWTH

## 2013-02-07 LAB — CBC
HCT: 31 % — ABNORMAL LOW (ref 36.0–46.0)
Hemoglobin: 10.7 g/dL — ABNORMAL LOW (ref 12.0–15.0)
MCH: 27.5 pg (ref 26.0–34.0)
MCHC: 34.5 g/dL (ref 30.0–36.0)
RBC: 3.89 MIL/uL (ref 3.87–5.11)

## 2013-02-07 LAB — GLUCOSE, CAPILLARY
Glucose-Capillary: 142 mg/dL — ABNORMAL HIGH (ref 70–99)
Glucose-Capillary: 117 mg/dL — ABNORMAL HIGH (ref 70–99)
Glucose-Capillary: 215 mg/dL — ABNORMAL HIGH (ref 70–99)

## 2013-02-07 LAB — BASIC METABOLIC PANEL
BUN: 29 mg/dL — ABNORMAL HIGH (ref 6–23)
CO2: 26 mEq/L (ref 19–32)
GFR calc non Af Amer: 14 mL/min — ABNORMAL LOW (ref >90)
Glucose, Bld: 145 mg/dL — ABNORMAL HIGH (ref 70–99)
Potassium: 3.6 mEq/L (ref 3.5–5.1)

## 2013-02-07 MED ORDER — TRAMADOL HCL 50 MG PO TABS
50.0000 mg | ORAL_TABLET | Freq: Two times a day (BID) | ORAL | Status: DC | PRN
  Administered 2013-02-07 – 2013-02-10 (×2): 50 mg via ORAL
  Filled 2013-02-07 (×3): qty 1

## 2013-02-07 MED ORDER — INSULIN ASPART 100 UNIT/ML ~~LOC~~ SOLN
0.0000 [IU] | Freq: Three times a day (TID) | SUBCUTANEOUS | Status: DC
  Administered 2013-02-08: 5 [IU] via SUBCUTANEOUS
  Administered 2013-02-09 (×2): 2 [IU] via SUBCUTANEOUS
  Administered 2013-02-10: 3 [IU] via SUBCUTANEOUS
  Administered 2013-02-10 – 2013-02-11 (×2): 2 [IU] via SUBCUTANEOUS
  Administered 2013-02-11 – 2013-02-13 (×3): 3 [IU] via SUBCUTANEOUS

## 2013-02-07 MED ORDER — ZOLPIDEM TARTRATE 5 MG PO TABS
5.0000 mg | ORAL_TABLET | Freq: Every evening | ORAL | Status: DC | PRN
  Administered 2013-02-07 – 2013-02-12 (×6): 5 mg via ORAL
  Filled 2013-02-07 (×6): qty 1

## 2013-02-07 MED ORDER — TRAMADOL HCL 50 MG PO TABS
50.0000 mg | ORAL_TABLET | Freq: Four times a day (QID) | ORAL | Status: AC | PRN
  Administered 2013-02-07 (×2): 50 mg via ORAL
  Filled 2013-02-07 (×2): qty 1

## 2013-02-07 MED ORDER — CALCIUM CARBONATE ANTACID 500 MG PO CHEW
1.0000 | CHEWABLE_TABLET | Freq: Three times a day (TID) | ORAL | Status: DC | PRN
  Filled 2013-02-07: qty 1

## 2013-02-07 MED ORDER — INSULIN ASPART 100 UNIT/ML ~~LOC~~ SOLN
0.0000 [IU] | Freq: Every day | SUBCUTANEOUS | Status: DC
  Administered 2013-02-07 – 2013-02-08 (×2): 2 [IU] via SUBCUTANEOUS

## 2013-02-07 MED ORDER — DILTIAZEM HCL 60 MG PO TABS
60.0000 mg | ORAL_TABLET | Freq: Four times a day (QID) | ORAL | Status: DC
  Administered 2013-02-07 – 2013-02-13 (×24): 60 mg via ORAL
  Filled 2013-02-07 (×27): qty 1

## 2013-02-07 NOTE — Progress Notes (Signed)
[name] continues to make progress. She did not sleep all that well last night. There is no confusion. She did well with dialysis yesterday.  Her appetite seems to be improving a little bit. There's no abdominal pain. There is no vomiting. She's had no bleeding.  Her white cell count is coming up. It is 2.4 today. She probably get a dose of Neupogen today and then likely be able to DC this tomorrow. I will go ahead and stop her antibiotics. She is not neutropenic. We can stop the neutropenic precautions.  She's currently physical therapy. She is very weak. I think we plan on trying to get her home next week, and physical therapy will be very helpful.  Again, should make is a nice improvement.  She'll be off dialysis over the weekend.  Her atrial fibrillation is quite rapid this morning. Her heart rate is about 130.  Is quite irregular. She's not been symptomatic from this.  Her blood pressure is back up again. Is 176/114. She is afebrile. She is a good oxygen saturation.  She does have some ulcers about her lips. No intraoral lesions are noted. No adenopathy noted in neck. Her lungs are clear. Cardiac exam tachycardia and irregular consistent with rapid atrial fibrillation. Abdomen is soft. Bowel sounds are slightly decreased. There is no guarding or rebound tenderness. Extremities shows some trace edema. Neurological exam shows no focal neurological deficits.  We will continue to follow along. Again, as her immune system start to improve, then I think she will begin to make strides and be able to get out of the hospital next week.    Lockie Mola 37:4

## 2013-02-07 NOTE — Progress Notes (Signed)
[facility] Progress Note    [name] ZOX:[id number] DOB: 17-Oct-1941 DOA: 01/28/2013 PCP: [name], MD  Brief narrative:  72 year old white woman initially admitted to [facility] on 01/28/13 by Dr. 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 She developed acute renal failure that has worsened to the point where she was uremic, with increased confusion and jerking movements of her upper and lower extremities. Her creatinine was up to 4.3 from 1.9 on admission. Renal ultrasound without evidence for obstruction or hydronephrosis. She has been followed by Dr. Arlean Hopping who has requested transfer to [facility] to (4/9) to start dialysis given her uremic symptoms. [facility] was asked to assist with transfer. Multiple family members present at time of admitting MD interview including her 3 children.   Assessment/Plan:    ARF (acute renal failure) on CKD (chronic kidney disease) stage 2, GFR 60-89 ml/min/ATN (acute tubular necrosis) -started HD this admit via femoral access -HD for 4/9 interrupted by sx AF/RVR but has tolerated at bedside much better  (4/10) - dialyzed 4/11 then hold over WE and monitor response - making urine 300cc/ last 12 hrs  -? ATN from chemorxn   Febrile neutropenia  Received full treatment - cultures negative. stoped abx 4/12    Atrial fibrillation with RVR -was on Pradaxa pre admit but was dc'd due to pancytopenia/low platelets -RVR during 1st HD tx-resolved Then back tachycardic  -required CCB gtt then converted to NSR so have transitioned to po BB and CCB Back into afib 4/12 plan to titrate up CCB    Large cell (diffuse) non-Hodgkin's lymphoma/  Antineoplastic chemotherapy induced pancytopenia -per oncology  -transfused platelets and Neupogen (4/10)     Type II or unspecified type diabetes mellitus with ketoacidosis, controlled -CBG's controlled during admit with  SSI -was on Amaryl pre admit    History of CAD/HTN -Pradaxa on hold -BB initiated 4/10 at 1/2 home dose but BP up so will increase dose -intolerant to statins -ACE I on hold due to ARF requiring HD- will NOT resume    Nausea and vomiting/Anorexia -due to recent chemo tx and underlying uremia -today (4/11) endorsed very hungry    Abdominal  pain, other specified site -resolved    Constipation due to pain medication -add colace and Miralax   DVT prophylaxis: SCDs-unable to proceed with pharmacological prophylaxis despite underlying malignancy secondary to pancytopenia Code Status: Full Family Communication: Patient and multiple family members Disposition Plan: home  Isolation: Reverse isolation/neutropenic precautions  Consultants: Nephrology Oncology  Procedures: Right femoral dialysis catheter placement with ultrasound guidance  (4/9)  Antibiotics: Primaxin 4/8 >>>4/12 Diflucan 4/5 >>> Maxipime 4/5 >>> 4/8  HPI/Subjective:  c/o stomach upset after eating   Objective: Blood pressure 128/90, pulse 73, temperature 98 F (36.7 C), temperature source Oral, resp. rate 17, height 5\' 2"  (1.575 m), weight 82.6 kg (182 lb 1.6 oz), SpO2 98.00%.  Intake/Output Summary (Last 24 hours) at 02/07/13 1026 Last data filed at 02/07/13 0850  Gross per 24 hour  Intake     60 ml  Output   2300 ml  Net  -2240 ml    Patient Vitals for the past 24 hrs:  BP Temp Temp src Pulse Resp SpO2 Weight  02/07/13 0850 128/90 mmHg 98 F (36.7 C) - 73 17 98 % -  02/07/13 0500 176/114 mmHg 98.4 F (36.9 C) Oral 94 18 95 % 82.6 kg (182 lb 1.6 oz)  02/06/13 2100 168/68 mmHg 99.1 F (37.3 C) Oral 72 17 97 % -  02/06/13 1841 178/83 mmHg - - 92 18 97 % 81.6 kg (179 lb 14.3 oz)  02/06/13 1815 170/76 mmHg 98 F (36.7 C) Oral 82 - - -  02/06/13 1800 171/78 mmHg - - 81 - - -  02/06/13 1730 175/79 mmHg - - 82 - - -  02/06/13 1700 174/79 mmHg - - 78 - - -  02/06/13 1630 171/80 mmHg - - 74 - - -   02/06/13 1559 177/76 mmHg - - 81 16 - -  02/06/13 1550 172/80 mmHg 98.3 F (36.8 C) Oral 80 15 96 % 81.5 kg (179 lb 10.8 oz)  02/06/13 1200 149/58 mmHg 98.9 F (37.2 C) Oral 76 24 95 % -     Exam: Lips with blisters  Lungs: Clear to auscultation bilaterally without wheezes and a few fine bibasilar crackles, RA Cardiovascular: Regular rate and rhythm without murmur gallop or rub normal S1 and S2, no peripheral edema or JVD Abdomen: minimal epigastric tenderness , nondistended, soft, bowel sounds positive, no rebound, Renal: Dialysis catheter right groin  Data Reviewed: Basic Metabolic Panel:  Recent Labs Lab 02/04/13 0535 02/04/13 1730 02/05/13 0014 02/05/13 1800 02/06/13 0530 02/07/13 0600  NA 136  --  135 138 139 140  K 5.0  --  4.7 3.8 3.7 3.6  CL 109  --  106 103 106 104  CO2 17*  --  20 25 24 26   GLUCOSE 143*  --  155* 143* 110* 145*  BUN 54*  --  44* 38* 47* 29*  CREATININE 4.30*  --  4.05* 3.60* 4.11* 3.23*  CALCIUM 8.4  --  8.2* 8.5 8.4 8.5  MG  --  1.6  --   --   --   --    Liver Function Tests: No results found for this basename: AST, ALT, ALKPHOS, BILITOT, PROT, ALBUMIN,  in the last 168 hours No results found for this basename: LIPASE, AMYLASE,  in the last 168 hours No results found for this basename: AMMONIA,  in the last 168 hours CBC:  Recent Labs Lab 02/03/13 0510 02/04/13 0535 02/05/13 0014 02/06/13 0530 02/07/13 0600  WBC 0.3* 0.4* 0.3* 1.0* 2.4*  HGB 9.2* 9.8* 9.4* 8.9* 10.7*  HCT 25.8* 27.4* 26.5* 25.4* 31.0*  MCV 79.1 79.7 78.4 78.6 79.7  PLT 41* 35* 14* 36* 22*   Cardiac Enzymes:  Recent Labs Lab 02/04/13 1752 02/05/13 0014 02/05/13 0506  TROPONINI <0.30 <0.30 <0.30   BNP (last 3 results) No results found for this basename: PROBNP,  in the last 8760 hours CBG:  Recent Labs Lab 02/05/13 2027 02/06/13 0817 02/06/13 1234 02/06/13 2131 02/07/13 0733  GLUCAP 119* 96 169* 165* 142*    Recent Results (from the past 240  hour(s))  URINE CULTURE     Status: None   Collection Time    01/28/13 10:55 PM      Result Value Range Status   Specimen Description URINE, CLEAN CATCH   Final   Special Requests NONE   Final   Culture  Setup Time 01/29/2013 09:26   Final   Colony Count NO GROWTH   Final   Culture NO GROWTH   Final   Report Status 01/30/2013 FINAL   Final  URINE CULTURE     Status: None   Collection Time    01/31/13 10:50 PM      Result Value Range Status   Specimen  Description URINE, RANDOM   Final   Special Requests NONE   Final   Culture  Setup Time 02/01/2013 03:34   Final   Colony Count NO GROWTH   Final   Culture NO GROWTH   Final   Report Status 02/02/2013 FINAL   Final  CULTURE, BLOOD (ROUTINE X 2)     Status: None   Collection Time    02/01/13 12:14 AM      Result Value Range Status   Specimen Description BLOOD RIGHT ARM   Final   Special Requests BOTTLES DRAWN AEROBIC AND ANAEROBIC    Final   Culture  Setup Time 02/01/2013 13:44   Final   Culture     Final   Value:        BLOOD CULTURE RECEIVED NO GROWTH TO DATE CULTURE WILL BE HELD FOR 5 DAYS BEFORE ISSUING A FINAL NEGATIVE REPORT   Report Status PENDING   Incomplete  CULTURE, BLOOD (ROUTINE X 2)     Status: None   Collection Time    02/01/13 12:14 AM      Result Value Range Status   Specimen Description BLOOD  PORT   Final   Special Requests BOTTLES DRAWN AEROBIC AND ANAEROBIC    Final   Culture  Setup Time 02/01/2013 13:44   Final   Culture     Final   Value:        BLOOD CULTURE RECEIVED NO GROWTH TO DATE CULTURE WILL BE HELD FOR 5 DAYS BEFORE ISSUING A FINAL NEGATIVE REPORT   Report Status PENDING   Incomplete  MRSA PCR SCREENING     Status: None   Collection Time    02/04/13 12:36 PM      Result Value Range Status   MRSA by PCR NEGATIVE  NEGATIVE Final   Comment:            The GeneXpert MRSA Assay (FDA     approved for NASAL specimens     only), is one component of a     comprehensive MRSA colonization      surveillance program. It is not     intended to diagnose MRSA     infection nor to guide or     monitor treatment for     MRSA infections.     Studies:  Recent x-ray studies have been reviewed in detail by the Attending Physician  Scheduled Meds:  . antiseptic oral rinse  15 mL Mouth Rinse QID  . diltiazem  30 mg Oral Q6H  . docusate sodium  100 mg Oral BID  . filgrastim  480 mcg Subcutaneous Daily  . insulin aspart  0-15 Units Subcutaneous TID WC  . lactose free nutrition  237 mL Oral TID WC  . metoprolol tartrate  50 mg Oral BID  . pantoprazole  40 mg Oral Daily  . polyethylene glycol  17 g Oral Daily     Trejuan Matherne,MD [phone] 02/07/2013, 10:26 AM

## 2013-02-07 NOTE — Progress Notes (Signed)
  Pt orientation to unit, room and routine. Information packet given to patient/family and safety video watched.  Admission INP armband ID verified with patient/family, and in place. Patient and family verbalizing understanding of risks associated with falls. Pt verbalizes an understanding of how to use the call bell and to call for help before getting out of bed.  Will cont to monitor and assist as needed.  Gilman Schmidt, RN

## 2013-02-07 NOTE — Progress Notes (Signed)
Subjective: Up in chair, feeling better, UOP 2000 mL yesterday  Objective Vital signs in last 24 hours: Filed Vitals:   02/06/13 1841 02/06/13 2100 02/07/13 0500 02/07/13 0850  BP: 178/83 168/68 176/114 128/90  Pulse: 92 72 94 73  Temp:  99.1 F (37.3 C) 98.4 F (36.9 C) 98 F (36.7 C)  TempSrc:  Oral Oral   Resp: 18 17 18 17   Height:      Weight: 81.6 kg (179 lb 14.3 oz)  82.6 kg (182 lb 1.6 oz)   SpO2: 97% 97% 95% 98%   Weight change: 2.9 kg (6 lb 6.3 oz)  Intake/Output Summary (Last 24 hours) at 02/07/13 1201 Last data filed at 02/07/13 0850  Gross per 24 hour  Intake     60 ml  Output   2300 ml  Net  -2240 ml   Labs: Basic Metabolic Panel:  Recent Labs Lab 02/05/13 0014 02/05/13 1800 02/06/13 0530 02/07/13 0600  NA 135 138 139 140  K 4.7 3.8 3.7 3.6  CL 106 103 106 104  CO2 20 25 24 26   GLUCOSE 155* 143* 110* 145*  BUN 44* 38* 47* 29*  CREATININE 4.05* 3.60* 4.11* 3.23*  CALCIUM 8.2* 8.5 8.4 8.5   Liver Function Tests: No results found for this basename: AST, ALT, ALKPHOS, BILITOT, PROT, ALBUMIN,  in the last 168 hours No results found for this basename: LIPASE, AMYLASE,  in the last 168 hours No results found for this basename: AMMONIA,  in the last 168 hours CBC:  Recent Labs Lab 02/04/13 0535 02/05/13 0014 02/06/13 0530 02/07/13 0600  WBC 0.4* 0.3* 1.0* 2.4*  HGB 9.8* 9.4* 8.9* 10.7*  HCT 27.4* 26.5* 25.4* 31.0*  MCV 79.7 78.4 78.6 79.7  PLT 35* 14* 36* 22*   PT/INR: @LABRCNTIP (inr:5)   Scheduled Meds ) . antiseptic oral rinse  15 mL Mouth Rinse QID  . diltiazem  60 mg Oral Q6H  . docusate sodium  100 mg Oral BID  . filgrastim  480 mcg Subcutaneous Daily  . insulin aspart  0-15 Units Subcutaneous TID WC  . lactose free nutrition  237 mL Oral TID WC  . metoprolol tartrate  50 mg Oral BID  . pantoprazole  40 mg Oral Daily  . polyethylene glycol  17 g Oral Daily    Physical Exam:  Blood pressure 128/90, pulse 73, temperature 98 F  (36.7 C), temperature source Oral, resp. rate 17, height 5\' 2"  (1.575 m), weight 82.6 kg (182 lb 1.6 oz), SpO2 98.00%.  Gen: more alert, no distress, talkative Skin: no rash, cyanosis  HEENT: EOMI, sclera anicteric, throat slightly dry  Neck: JVP 12 cm  Chest: clear bilat , no rales or rhonchi  CV: regular, no rub or gallop, no murmur, pedal pulses intact bilat  Abdomen: soft, nontender, no mass or HSM, liver down 3 cm, no ascites  Ext: no LE edema Neuro: alert, Ox3, no focal deficit   Date   Creat   Other 2011   0.7-2.1  2012   1.1-1.8  2013   1.4-1.8  11/05/12   1.38  01/21/13  1.06  3/31   0.93  Chemo    01/27/13   1.33  4/3   2.17  4/6   2.47  4/8   3.41   eGFR 33mL/min  CT 12/24/12 +IV contrast 80 cc  CT 01/21/13 >> +IV contrast 100 cc   UA 4/8 >> 30 protein, 0-2 rbc Urine Na high 105, UCr 17  Impression/Plan  1. Acute on chronic renal failure- suspect due to chemoRx / CKD III, also was on outpt ACEI. No other cause noted. Nonoliguric, s/p HD x 3 with improved sensorium. Hold HD over weekend 2. CKD III, baseline eGFR 50-60 ml/min- sees MD at [facility] 3. AMS- better, avoid narcotics 4. HTN- on metoprolol only. Let BP run on high side 5. Volume- 6kg up from admission, will give IV lasix one dose. 6. Recurrent NHL- s/p first round chemo 3/28-3/30 7. Pancytopenia- due to chemoRx, WBC improving, plts still low    Vinson Moselle  MD [phone] pgr    [phone] cell 02/07/2013, 12:01 PM

## 2013-02-07 NOTE — Evaluation (Signed)
Physical Therapy Evaluation Patient Details Name: [name] MRN: [id number] DOB: 06/27/1941 Today's Date: 02/07/2013 Time: 4098-1191 PT Time Calculation (min): 30 min  PT Assessment / Plan / Recommendation Clinical Impression  72 yo female with hx of relapse of lymphoma being treated with chemotherapy who developed ARF.  She has deconditioning with generalized weakness and chronic back pain. She will benefit from PT while she is the hospital and likely HHPT upon discharge    PT Assessment  Patient needs continued PT services    Follow Up Recommendations  Home health PT    Does the patient have the potential to tolerate intense rehabilitation      Barriers to Discharge None      Equipment Recommendations  None recommended by PT    Recommendations for Other Services     Frequency Min 3X/week    Precautions / Restrictions Precautions Precautions: Fall Precaution Comments: neutropenic   Pertinent Vitals/Pain Pt c/o chronic back pain      Mobility  Bed Mobility Bed Mobility: Rolling Left;Rolling Right;Right Sidelying to Sit;Supine to Sit Rolling Right: 7: Independent Rolling Left: 7: Independent Right Sidelying to Sit: 7: Independent Supine to Sit: 7: Independent Details for Bed Mobility Assistance: pt reports she has back pain Transfers Transfers: Sit to Stand;Stand to Sit Sit to Stand: 5: Supervision;With upper extremity assist Stand to Sit: 5: Supervision;Without upper extremity assist Ambulation/Gait Ambulation/Gait Assistance: 4: Min assist Ambulation Distance (Feet): 25 Feet Assistive device: Rolling walker Ambulation/Gait Assistance Details: some assist to guide RW Gait Pattern: Within Functional Limits Gait velocity: decreased General Gait Details: pt did well with RW for short distances Stairs: No Wheelchair Mobility Wheelchair Mobility: No    Exercises Other Exercises Other Exercises: pt encouraged to do short bouts of activity frequently:  deep breathing, abd sets, glute sets   PT Diagnosis: Difficulty walking;Generalized weakness  PT Problem List: Decreased activity tolerance;Pain;Decreased mobility;Decreased strength PT Treatment Interventions: DME instruction;Gait training;Functional mobility training;Stair training;Therapeutic activities;Therapeutic exercise;Balance training   PT Goals Acute Rehab PT Goals PT Goal Formulation: With patient Time For Goal Achievement: 02/21/13 Potential to Achieve Goals: Good Pt will go Sit to Stand: with modified independence PT Goal: Sit to Stand - Progress: Goal set today Pt will go Stand to Sit: with modified independence PT Goal: Stand to Sit - Progress: Goal set today Pt will Ambulate: 51 - 150 feet;with modified independence;with least restrictive assistive device PT Goal: Ambulate - Progress: Goal set today Pt will Go Up / Down Stairs: 1-2 stairs;with modified independence;with least restrictive assistive device PT Goal: Up/Down Stairs - Progress: Goal set today Pt will Perform Home Exercise Program: Independently PT Goal: Perform Home Exercise Program - Progress: Goal set today  Visit Information  Last PT Received On: 02/07/13    Subjective Data  Subjective: "i might get to go home by Easter" Patient Stated Goal: to get stronger   Prior Functioning  Home Living Lives With: Spouse Available Help at Discharge: Family Type of Home: House Home Access: Stairs to enter Secretary/administrator of Steps: 1 Entrance Stairs-Rails: None Home Layout: One level Home Adaptive Equipment: Environmental consultant - four wheeled Prior Function Level of Independence: Independent Able to Take Stairs?: Yes    Cognition  Cognition Overall Cognitive Status: Appears within functional limits for tasks assessed/performed Arousal/Alertness: Awake/alert Orientation Level: Appears intact for tasks assessed Behavior During Session: [facility] for tasks performed    Extremity/Trunk Assessment Right Lower  Extremity Assessment RLE ROM/Strength/Tone: [facility] for tasks assessed Left Lower Extremity Assessment LLE  ROM/Strength/Tone: [facility] for tasks assessed Trunk Assessment Trunk Assessment: Other exceptions Trunk Exceptions: generalized muscle weakness and deconditioning   Balance Balance Balance Assessed: Yes Static Sitting Balance Static Sitting - Balance Support: No upper extremity supported;Feet supported Static Sitting - Level of Assistance: 7: Independent Static Standing Balance Static Standing - Balance Support: Bilateral upper extremity supported;During functional activity Static Standing - Level of Assistance: 6: Modified independent (Device/Increase time)  End of Session PT - End of Session Activity Tolerance: Patient tolerated treatment well Patient left: in bed  GP    Zayvier Caravello K. [place], [place] [phone] 02/07/2013, 4:15 PM

## 2013-02-08 DIAGNOSIS — R630 Anorexia: Secondary | ICD-10-CM

## 2013-02-08 DIAGNOSIS — N19 Unspecified kidney failure: Secondary | ICD-10-CM

## 2013-02-08 DIAGNOSIS — D631 Anemia in chronic kidney disease: Secondary | ICD-10-CM

## 2013-02-08 LAB — GLUCOSE, CAPILLARY: Glucose-Capillary: 223 mg/dL — ABNORMAL HIGH (ref 70–99)

## 2013-02-08 LAB — CBC
Platelets: 11 10*3/uL — CL (ref 150–400)
RDW: 19.8 % — ABNORMAL HIGH (ref 11.5–15.5)
WBC: 6.4 10*3/uL (ref 4.0–10.5)

## 2013-02-08 LAB — BASIC METABOLIC PANEL
Chloride: 105 mEq/L (ref 96–112)
GFR calc Af Amer: 11 mL/min — ABNORMAL LOW (ref >90)
Potassium: 3.4 mEq/L — ABNORMAL LOW (ref 3.5–5.1)
Sodium: 142 mEq/L (ref 135–145)

## 2013-02-08 MED ORDER — POTASSIUM CHLORIDE CRYS ER 20 MEQ PO TBCR
EXTENDED_RELEASE_TABLET | ORAL | Status: AC
  Filled 2013-02-08: qty 2

## 2013-02-08 MED ORDER — POTASSIUM CHLORIDE CRYS ER 20 MEQ PO TBCR
40.0000 meq | EXTENDED_RELEASE_TABLET | Freq: Once | ORAL | Status: AC
  Administered 2013-02-08: 40 meq via ORAL

## 2013-02-08 NOTE — Progress Notes (Signed)
Subjective: Up in chair, feeling better, UOP 1400 cc yesterday, getting up with PT, appetite is marginal  Objective Vital signs in last 24 hours: Filed Vitals:   02/07/13 2129 02/08/13 0441 02/08/13 0526 02/08/13 0915  BP: 175/71 154/66 152/65 157/72  Pulse: 95 80 81 84  Temp: 98.4 F (36.9 C) 98.2 F (36.8 C) 98.8 F (37.1 C) 98.2 F (36.8 C)  TempSrc: Oral Oral Oral Oral  Resp: 18 18 16 18   Height:      Weight: 76.8 kg (169 lb 5 oz)     SpO2: 98% 97% 98% 93%   Weight change: -4.7 kg (-10 lb 5.8 oz)  Intake/Output Summary (Last 24 hours) at 02/08/13 1148 Last data filed at 02/08/13 0444  Gross per 24 hour  Intake    370 ml  Output   1200 ml  Net   -830 ml   Labs: Basic Metabolic Panel:  Recent Labs Lab 02/05/13 1800 02/06/13 0530 02/07/13 0600 02/08/13 0510  NA 138 139 140 142  K 3.8 3.7 3.6 3.4*  CL 103 106 104 105  CO2 25 24 26 27   GLUCOSE 143* 110* 145* 82  BUN 38* 47* 29* 32*  CREATININE 3.60* 4.11* 3.23* 4.17*  CALCIUM 8.5 8.4 8.5 8.4   Liver Function Tests: No results found for this basename: AST, ALT, ALKPHOS, BILITOT, PROT, ALBUMIN,  in the last 168 hours No results found for this basename: LIPASE, AMYLASE,  in the last 168 hours No results found for this basename: AMMONIA,  in the last 168 hours CBC:  Recent Labs Lab 02/05/13 0014 02/06/13 0530 02/07/13 0600 02/08/13 0510  WBC 0.3* 1.0* 2.4* 6.4  HGB 9.4* 8.9* 10.7* 9.2*  HCT 26.5* 25.4* 31.0* 27.4*  MCV 78.4 78.6 79.7 80.4  PLT 14* 36* 22* 11*   PT/INR: @LABRCNTIP (inr:5)   Scheduled Meds ) . antiseptic oral rinse  15 mL Mouth Rinse QID  . diltiazem  60 mg Oral Q6H  . docusate sodium  100 mg Oral BID  . filgrastim  480 mcg Subcutaneous Daily  . insulin aspart  0-15 Units Subcutaneous TID WC  . insulin aspart  0-5 Units Subcutaneous QHS  . lactose free nutrition  237 mL Oral TID WC  . metoprolol tartrate  50 mg Oral BID  . pantoprazole  40 mg Oral Daily  . polyethylene glycol  17  g Oral Daily  . potassium chloride SA        Physical Exam:  Blood pressure 157/72, pulse 84, temperature 98.2 F (36.8 C), temperature source Oral, resp. rate 18, height 5\' 2"  (1.575 m), weight 76.8 kg (169 lb 5 oz), SpO2 93.00%.  Gen: more alert, no distress, talkative Skin: no rash, cyanosis  HEENT: EOMI, sclera anicteric, throat slightly dry  Neck: JVP 12 cm  Chest: clear bilat , no rales or rhonchi  CV: regular, no rub or gallop, no murmur, pedal pulses intact bilat  Abdomen: soft, nontender, no mass or HSM, liver down 3 cm, no ascites  Ext: no LE edema Neuro: alert, Ox3, no focal deficit   Date   Creat   Other 2011   0.7-2.1  2012   1.1-1.8  2013   1.4-1.8  11/05/12   1.38  01/21/13  1.06  3/31   0.93  Chemo    01/27/13   1.33  4/3   2.17  4/6   2.47  4/8   3.41   eGFR 55mL/min  CT 12/24/12 +IV contrast 80 cc  CT 01/21/13 >> +IV contrast 100 cc   UA 4/8 >> 30 protein, 0-2 rbc Urine Na high 105, UCr 17  Impression/Plan  1. Acute on chronic renal failure- suspect due to chemoRx (ifosfamide/carboplatin) in setting of CKD III / ACEI.  No other cause noted. Nonoliguric, s/p HD x 3 with improved sensorium. Creatinine rising over W/E without HD, plan remove foley and temp HD cath in groin. Will ask IR to place HD cath tomorrow for further HD. Platelets are low which may complicate things. HD ordered for Monday after cath placement 2. CKD III, baseline eGFR 50-60 ml/min 3. HTN- on metoprolol only. Let BP run on high side. Euvolemic 4. 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 Pancytopenia- due to chemoRx, WBC improving, plts still low Diana Rily  MD [phone] pgr    [phone] cell 02/08/2013, 11:48 AM

## 2013-02-08 NOTE — Progress Notes (Signed)
IP PROGRESS NOTE  Subjective:   Patient is doing better. Slept well. No bleeding noted.   Objective:  Vital signs in last 24 hours: Temp:  [98 F (36.7 C)-98.8 F (37.1 C)] 98.8 F (37.1 C) (04/13 0526) Pulse Rate:  [73-95] 81 (04/13 0526) Resp:  [16-18] 16 (04/13 0526) BP: (128-175)/(65-90) 152/65 mmHg (04/13 0526) SpO2:  [96 %-98 %] 98 % (04/13 0526) Weight:  [169 lb 5 oz (76.8 kg)] 169 lb 5 oz (76.8 kg) (04/12 2129) Weight change: -10 lb 5.8 oz (-4.7 kg) Last BM Date: 02/06/13  Intake/Output from previous day: 04/12 0701 - 04/13 0700 In: 430 [P.O.:190; I.V.:240] Out: 1500 [Urine:1500]  Mouth: mucous membranes moist, pharynx normal without lesions Resp: clear to auscultation bilaterally Cardio: regular rate and rhythm, S1, S2 normal, no murmur, click, rub or gallop GI: soft, non-tender; bowel sounds normal; no masses,  no organomegaly Extremities: extremities normal, atraumatic, no cyanosis or edema    Lab Results:  Recent Labs  02/06/13 0530 02/07/13 0600  WBC 1.0* 2.4*  HGB 8.9* 10.7*  HCT 25.4* 31.0*  PLT 36* 22*    BMET  Recent Labs  02/06/13 0530 02/07/13 0600  NA 139 140  K 3.7 3.6  CL 106 104  CO2 24 26  GLUCOSE 110* 145*  BUN 47* 29*  CREATININE 4.11* 3.23*  CALCIUM 8.4 8.5    Medications: I have reviewed the patient's current medications.  Assessment/Plan:  72 year old with:   1. Large cell (diffuse) non-Hodgkin's lymphoma. S/P R-ICE chemotherapy. 2. Pancytopenia: Due to chemotherapy, improving. No need for platelet transfusion unless active bleeding is noted.  3. Acute renal failure: S/P HD on 4/11. Cr improved today.   Will continue to follow.    LOS: 11 days   Jaelle Campanile 02/08/2013, 7:41 AM

## 2013-02-08 NOTE — Progress Notes (Signed)
[facility] Progress Note    [name] ZOX:[id number] DOB: [birth year] DOA: 01/28/2013 PCP: [name], MD  Brief narrative:  72 year old white woman initially admitted to [facility] on 01/28/13 by Dr. 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 She developed acute renal failure that has worsened to the point where she was uremic, with increased confusion and jerking movements of her upper and lower extremities. Her creatinine was up to 4.3 from 1.9 on admission. Renal ultrasound without evidence for obstruction or hydronephrosis. She has been followed by Dr. Arlean Hopping who has requested transfer to [facility] to (4/9) to start dialysis given her uremic symptoms. [facility] was asked to assist with transfer. Multiple family members present at time of admitting MD interview including her 3 children.   Assessment/Plan:    ARF (acute renal failure) on CKD (chronic kidney disease) stage 2, GFR 60-89 ml/min/ATN (acute tubular necrosis) -started HD this admit via femoral access -HD for 4/9 interrupted by sx AF/RVR but has tolerated at bedside much better  (4/10) - dialyzed 4/11 then hold over WE and monitor response - making urine 1200cc/ last 24 hrs  -? ATN from chemorxn   Febrile neutropenia  Received full treatment - cultures negative. stoped abx 4/12   Pancytopenia from chemo Received neulasta and neutropenia has resolved by 02/08/13 Thrombocytopenia is getting worse - will monitor for bleeding    Atrial fibrillation with RVR -was on Pradaxa pre admit but was dc'd due to thrombocytopenia  -RVR during 1st HD tx-resolved -required CCB gtt then converted to NSR so have transitioned to po BB and CCB Back into afib 4/12  titrated up CCB Back into NSR 4/13     Large cell (diffuse) non-Hodgkin's lymphoma/  Antineoplastic chemotherapy induced pancytopenia -per oncology  -transfused platelets and  Neupogen (4/10)     Type II or unspecified type diabetes mellitus with ketoacidosis, controlled -CBG's controlled during admit with SSI -was on Amaryl pre admit    History of CAD/HTN -Pradaxa on hold -BB initiated 4/10 at 1/2 home dose but BP up so will increase dose -intolerant to statins -ACE I on hold due to ARF requiring HD- will NOT resume    Nausea and vomiting/Anorexia -due to recent chemo tx and underlying uremia - resolved     Abdominal  pain, other specified site -resolved    Constipation due to pain medication - resolved   DVT prophylaxis: SCDs-unable to proceed with pharmacological prophylaxis despite underlying malignancy secondary to pancytopenia Code Status: Full Family Communication: Patient  Disposition Plan: home    Consultants: Nephrology Oncology  Procedures: Right femoral dialysis catheter placement with ultrasound guidance  (4/9)  Antibiotics: Primaxin 4/8 >>>4/12 Diflucan 4/5 >>>4/12  Maxipime 4/5 >>> 4/8  HPI/Subjective: Feels great   Objective: Blood pressure 157/72, pulse 84, temperature 98.2 F (36.8 C), temperature source Oral, resp. rate 18, height 5\' 2"  (1.575 m), weight 76.8 kg (169 lb 5 oz), SpO2 93.00%.  Intake/Output Summary (Last 24 hours) at 02/08/13 1009 Last data filed at 02/08/13 0444  Gross per 24 hour  Intake    370 ml  Output   1200 ml  Net   -830 ml    Patient Vitals for the past 24 hrs:  BP Temp Temp src Pulse Resp SpO2 Weight  02/08/13 0915 157/72 mmHg 98.2 F (36.8 C) Oral 84 18 93 % -  02/08/13 0526 152/65 mmHg 98.8 F (37.1 C) Oral 81 16  98 % -  02/08/13 0441 154/66 mmHg 98.2 F (36.8 C) Oral 80 18 97 % -  02/07/13 2129 175/71 mmHg 98.4 F (36.9 C) Oral 95 18 98 % 76.8 kg (169 lb 5 oz)  02/07/13 2116 175/71 mmHg - - 95 - - -  02/07/13 1619 169/73 mmHg 98 F (36.7 C) - 81 18 97 % -  02/07/13 1318 158/65 mmHg 98 F (36.7 C) - 87 18 96 % -     Exam: Lips with blisters  Lungs: Clear to auscultation  bilaterally without wheezes and a few fine bibasilar crackles, RA Cardiovascular: Regular rate and rhythm without murmur gallop or rub normal S1 and S2, no peripheral edema or JVD Abdomen: non tender  , nondistended, soft, bowel sounds positive, no rebound, Renal: Dialysis catheter right groin  Data Reviewed: Basic Metabolic Panel:  Recent Labs Lab 02/04/13 1730 02/05/13 0014 02/05/13 1800 02/06/13 0530 02/07/13 0600 02/08/13 0510  NA  --  135 138 139 140 142  K  --  4.7 3.8 3.7 3.6 3.4*  CL  --  106 103 106 104 105  CO2  --  20 25 24 26 27   GLUCOSE  --  155* 143* 110* 145* 82  BUN  --  44* 38* 47* 29* 32*  CREATININE  --  4.05* 3.60* 4.11* 3.23* 4.17*  CALCIUM  --  8.2* 8.5 8.4 8.5 8.4  MG 1.6  --   --   --   --   --    Liver Function Tests: No results found for this basename: AST, ALT, ALKPHOS, BILITOT, PROT, ALBUMIN,  in the last 168 hours No results found for this basename: LIPASE, AMYLASE,  in the last 168 hours No results found for this basename: AMMONIA,  in the last 168 hours CBC:  Recent Labs Lab 02/04/13 0535 02/05/13 0014 02/06/13 0530 02/07/13 0600 02/08/13 0510  WBC 0.4* 0.3* 1.0* 2.4* 6.4  HGB 9.8* 9.4* 8.9* 10.7* 9.2*  HCT 27.4* 26.5* 25.4* 31.0* 27.4*  MCV 79.7 78.4 78.6 79.7 80.4  PLT 35* 14* 36* 22* 11*   Cardiac Enzymes:  Recent Labs Lab 02/04/13 1752 02/05/13 0014 02/05/13 0506  TROPONINI <0.30 <0.30 <0.30   BNP (last 3 results) No results found for this basename: PROBNP,  in the last 8760 hours CBG:  Recent Labs Lab 02/07/13 0733 02/07/13 1140 02/07/13 1620 02/07/13 2121 02/08/13 0728  GLUCAP 142* 120* 117* 215* 85    Recent Results (from the past 240 hour(s))  URINE CULTURE     Status: None   Collection Time    01/31/13 10:50 PM      Result Value Range Status   Specimen Description URINE, RANDOM   Final   Special Requests NONE   Final   Culture  Setup Time 02/01/2013 03:34   Final   Colony Count NO GROWTH   Final    Culture NO GROWTH   Final   Report Status 02/02/2013 FINAL   Final  CULTURE, BLOOD (ROUTINE X 2)     Status: None   Collection Time    02/01/13 12:14 AM      Result Value Range Status   Specimen Description BLOOD RIGHT ARM   Final   Special Requests BOTTLES DRAWN AEROBIC AND ANAEROBIC    Final   Culture  Setup Time 02/01/2013 13:44   Final   Culture NO GROWTH 5 DAYS   Final   Report Status 02/07/2013 FINAL   Final  CULTURE, BLOOD (  ROUTINE X 2)     Status: None   Collection Time    02/01/13 12:14 AM      Result Value Range Status   Specimen Description BLOOD  PORT   Final   Special Requests BOTTLES DRAWN AEROBIC AND ANAEROBIC    Final   Culture  Setup Time 02/01/2013 13:44   Final   Culture NO GROWTH 5 DAYS   Final   Report Status 02/07/2013 FINAL   Final  MRSA PCR SCREENING     Status: None   Collection Time    02/04/13 12:36 PM      Result Value Range Status   MRSA by PCR NEGATIVE  NEGATIVE Final   Comment:            The GeneXpert MRSA Assay (FDA     approved for NASAL specimens     only), is one component of a     comprehensive MRSA colonization     surveillance program. It is not     intended to diagnose MRSA     infection nor to guide or     monitor treatment for     MRSA infections.     Studies:  Recent x-ray studies have been reviewed in detail by the Attending Physician  Scheduled Meds:  . antiseptic oral rinse  15 mL Mouth Rinse QID  . diltiazem  60 mg Oral Q6H  . docusate sodium  100 mg Oral BID  . filgrastim  480 mcg Subcutaneous Daily  . insulin aspart  0-15 Units Subcutaneous TID WC  . insulin aspart  0-5 Units Subcutaneous QHS  . lactose free nutrition  237 mL Oral TID WC  . metoprolol tartrate  50 mg Oral BID  . pantoprazole  40 mg Oral Daily  . polyethylene glycol  17 g Oral Daily  . potassium chloride  40 mEq Oral Once     Romonia Yanik,MD [phone] 02/08/2013, 10:09 AM

## 2013-02-09 DIAGNOSIS — N182 Chronic kidney disease, stage 2 (mild): Secondary | ICD-10-CM

## 2013-02-09 DIAGNOSIS — E1110 Type 2 diabetes mellitus with ketoacidosis without coma: Secondary | ICD-10-CM

## 2013-02-09 DIAGNOSIS — I4891 Unspecified atrial fibrillation: Secondary | ICD-10-CM

## 2013-02-09 DIAGNOSIS — N179 Acute kidney failure, unspecified: Secondary | ICD-10-CM

## 2013-02-09 DIAGNOSIS — C8589 Other specified types of non-Hodgkin lymphoma, extranodal and solid organ sites: Secondary | ICD-10-CM

## 2013-02-09 DIAGNOSIS — R112 Nausea with vomiting, unspecified: Secondary | ICD-10-CM

## 2013-02-09 LAB — GLUCOSE, CAPILLARY
Glucose-Capillary: 100 mg/dL — ABNORMAL HIGH (ref 70–99)
Glucose-Capillary: 141 mg/dL — ABNORMAL HIGH (ref 70–99)
Glucose-Capillary: 122 mg/dL — ABNORMAL HIGH (ref 70–99)

## 2013-02-09 LAB — CBC
Hemoglobin: 9.5 g/dL — ABNORMAL LOW (ref 12.0–15.0)
MCH: 27.3 pg (ref 26.0–34.0)
MCV: 80.7 fL (ref 78.0–100.0)
Platelets: 25 10*3/uL — CL (ref 150–400)
RBC: 3.48 MIL/uL — ABNORMAL LOW (ref 3.87–5.11)
WBC: 20.7 10*3/uL — ABNORMAL HIGH (ref 4.0–10.5)

## 2013-02-09 LAB — BASIC METABOLIC PANEL
CO2: 26 mEq/L (ref 19–32)
Chloride: 104 mEq/L (ref 96–112)
Glucose, Bld: 162 mg/dL — ABNORMAL HIGH (ref 70–99)
Sodium: 141 mEq/L (ref 135–145)

## 2013-02-09 NOTE — Progress Notes (Signed)
Pt heart rate still maintaining in 130s-140s. Pt asymptomatic and stable. Dr Lavera Guise made aware via text page. [place], Rosanna Randy

## 2013-02-09 NOTE — Progress Notes (Signed)
Physical Therapy Treatment Patient Details Name: EMELINE SIMPSON MRN: [id number] DOB: [birth year] Today's Date: 02/09/2013 Time: 1191-4782 PT Time Calculation (min): 37 min  PT Assessment / Plan / Recommendation Comments on Treatment Session  Pt is gradually improving but continues with decreased strength, especially in quads, limiting functional mobility. She will need continued PT at home at discharge    Follow Up Recommendations  Home health PT     Does the patient have the potential to tolerate intense rehabilitation     Barriers to Discharge        Equipment Recommendations  None recommended by PT    Recommendations for Other Services    Frequency Min 3X/week   Plan Discharge plan remains appropriate;Frequency remains appropriate    Precautions / Restrictions Precautions Precautions: Fall Restrictions Weight Bearing Restrictions: No   Pertinent Vitals/Pain No c/o pain today    Mobility  Bed Mobility Bed Mobility: Rolling Left;Rolling Right;Right Sidelying to Sit;Supine to Sit Rolling Right: 7: Independent Rolling Left: 7: Independent Right Sidelying to Sit: 7: Independent Supine to Sit: 7: Independent Transfers Transfers: Sit to Stand;Stand to Sit Sit to Stand: 5: Supervision;With upper extremity assist Stand to Sit: 5: Supervision;Without upper extremity assist Ambulation/Gait Ambulation/Gait Assistance: 4: Min assist Ambulation Distance (Feet): 50 Feet (25x2) Assistive device: Rolling walker Gait Pattern: Within Functional Limits Gait velocity: decreased General Gait Details: consistent cues needed to extend hips and trunk Pt with obvious fatigue at end of walks Stairs: No Wheelchair Mobility Wheelchair Mobility: No    Exercises General Exercises - Upper Extremity Shoulder Flexion: AROM;Both;Seated;10 reps (done with opposite knee lift in sitting) General Exercises - Lower Extremity Ankle Circles/Pumps: AROM;10 reps;Seated Gluteal Sets: AROM;Both;5  reps;Seated Long Arc Quad: AROM;10 reps;Seated Hip Flexion/Marching: AROM;Seated;10 reps (encourged abdominal set prior to exercise) Other Exercises Other Exercises: pt encouraged to do short bouts of activity frequently: deep breathing, abd sets, glute sets Other Exercises: repeated sit to stand x 5 reps with emphasis on slow, controlled descent to strengthen quads Other Exercises: altenating step forward and weight shift with unweighting of opposite leg   PT Diagnosis:    PT Problem List:   PT Treatment Interventions:     PT Goals Acute Rehab PT Goals PT Goal Formulation: With patient Time For Goal Achievement: 02/21/13 Potential to Achieve Goals: Good Pt will go Sit to Stand: with modified independence PT Goal: Sit to Stand - Progress: Progressing toward goal Pt will go Stand to Sit: with modified independence PT Goal: Stand to Sit - Progress: Progressing toward goal Pt will Ambulate: 51 - 150 feet;with modified independence;with least restrictive assistive device PT Goal: Ambulate - Progress: Progressing toward goal Pt will Go Up / Down Stairs: 1-2 stairs;with modified independence;with least restrictive assistive device Pt will Perform Home Exercise Program: Independently PT Goal: Perform Home Exercise Program - Progress: Progressing toward goal  Visit Information  Last PT Received On: 02/09/13 Assistance Needed: +1    Subjective Data  Subjective: My legs just give out and I don't know when! Patient Stated Goal: to get stronger   Cognition  Cognition Overall Cognitive Status: Appears within functional limits for tasks assessed/performed Arousal/Alertness: Awake/alert Orientation Level: Appears intact for tasks assessed Behavior During Session: [facility] for tasks performed    Balance  Balance Balance Assessed: Yes Static Sitting Balance Static Sitting - Comment/# of Minutes: able to keep trunk extended during UE and LE exercise Static Standing Balance Static Standing -  Balance Support: Bilateral upper extremity supported;During functional activity Static Standing -  Level of Assistance: 6: Modified independent (Device/Increase time)  End of Session PT - End of Session Activity Tolerance: Patient tolerated treatment well Patient left: in chair;with family/visitor present   GP    Rosey Bath K. Manson Passey, [place] [phone] 02/09/2013, 10:21 AM

## 2013-02-09 NOTE — Progress Notes (Signed)
Pt supposed to be going for IR procedure, so all meds held earlier this am. Dr Hyman Hopes called and confirmed pt will not be having procedure today. All morning meds given. Pt got to [facility] and after lying back down in bed, HR went up. Pt's heart rate 130-152, sustaining 140's on telemetry. Pt stable at this time, HR still in 140's. Dr Lavera Guise notified. Recommend bedrest for an hr before any activity. Will continue to monitor. [place], Rosanna Randy

## 2013-02-09 NOTE — Progress Notes (Signed)
[facility] Progress Note    RAILEE BONILLAS ZOX:[id number] DOB: [birth year] DOA: 01/28/2013 PCP: [name], MD  Brief narrative:  72 year old white woman initially admitted to [facility] on 01/28/13 by Dr. 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 She developed acute renal failure that has worsened to the point where she was uremic, with increased confusion and jerking movements of her upper and lower extremities. Her creatinine was up to 4.3 from 1.9 on admission. Renal ultrasound without evidence for obstruction or hydronephrosis. She has been followed by Dr. Arlean Hopping who has requested transfer to [facility] to (4/9) to start dialysis given her uremic symptoms. [facility] was asked to assist with transfer. Multiple family members present at time of admitting MD interview including her 3 children.  Assessment/Plan:    ARF (acute renal failure) on CKD (chronic kidney disease) stage 2, GFR 60-89 ml/min/ATN (acute tubular necrosis) -started HD this admit via femoral access - fem cath out on 02/08/13  -HD for 4/9 interrupted by sx AF/RVR but has tolerated at bedside much better  (4/10) - dialyzed 4/11 then hold and monitor response - making urine 1200cc/ last 24 hrs  -? ATN from chemorxn   Febrile neutropenia  Received full treatment - cultures negative. stoped abx 4/12   Pancytopenia from chemo Received neulasta and neutropenia has resolved by 02/08/13 Thrombocytopenia is getting worse - will monitor for bleeding    Atrial fibrillation with RVR -was on Pradaxa pre admit but was dc'd due to thrombocytopenia  -RVR during 1st HD tx-resolved -required CCB gtt then converted to NSR so have transitioned to po BB and CCB Back into afib 4/12  titrated up CCB Back into NSR 4/13     Large cell (diffuse) non-Hodgkin's lymphoma/  Antineoplastic chemotherapy induced pancytopenia -per oncology  -transfused  platelets and Neupogen (4/10)     Type II or unspecified type diabetes mellitus with ketoacidosis, controlled -CBG's controlled during admit with SSI -was on Amaryl pre admit    History of CAD/HTN -Pradaxa on hold -BB initiated 4/10 at 1/2 home dose but BP up so will increase dose -intolerant to statins -ACE I on hold due to ARF requiring HD- will NOT resume    Nausea and vomiting/Anorexia -due to recent chemo tx and underlying uremia - resolved     Abdominal  pain, other specified site -resolved    Constipation due to pain medication - resolved   DVT prophylaxis: SCDs-unable to proceed with pharmacological prophylaxis despite underlying malignancy secondary to thrombocytopenia  Code Status: Full Family Communication: Patient  Disposition Plan: home    Consultants: Nephrology Oncology  Procedures: Right femoral dialysis catheter placement with ultrasound guidance  (4/9)  Antibiotics: Primaxin 4/8 >>>4/12 Diflucan 4/5 >>>4/12  Maxipime 4/5 >>> 4/8  HPI/Subjective: Feels great   Objective: Blood pressure 168/75, pulse 84, temperature 98.4 F (36.9 C), temperature source Oral, resp. rate 20, height 5\' 2"  (1.575 m), weight 76.8 kg (169 lb 5 oz), SpO2 96.00%.  Intake/Output Summary (Last 24 hours) at 02/09/13 1141 Last data filed at 02/09/13 0900  Gross per 24 hour  Intake    700 ml  Output    375 ml  Net    325 ml    Patient Vitals for the past 24 hrs:  BP Temp Temp src Pulse Resp SpO2  02/09/13 1037 168/75 mmHg 98.4 F (36.9 C) Oral 84 20 96 %  02/09/13 0500 165/68 mmHg 98.9 F (  37.2 C) Oral 81 20 93 %  02/08/13 2043 151/66 mmHg 98 F (36.7 C) Oral 84 18 96 %  02/08/13 1628 155/64 mmHg 98.2 F (36.8 C) - 72 18 98 %  02/08/13 1303 170/76 mmHg 98.1 F (36.7 C) Oral 83 20 100 %     Exam: Lips with blisters  Lungs: Clear to auscultation bilaterally without wheezes and a few fine bibasilar crackles, RA Cardiovascular: Regular rate and rhythm without  murmur gallop or rub normal S1 and S2, no peripheral edema or JVD Abdomen: non tender  , nondistended, soft, bowel sounds positive, no rebound, Renal: Dialysis catheter right groin  Data Reviewed: Basic Metabolic Panel:  Recent Labs Lab 02/04/13 1730  02/05/13 1800 02/06/13 0530 02/07/13 0600 02/08/13 0510 02/09/13 1040  NA  --   < > 138 139 140 142 141  K  --   < > 3.8 3.7 3.6 3.4* 3.7  CL  --   < > 103 106 104 105 104  CO2  --   < > 25 24 26 27 26   GLUCOSE  --   < > 143* 110* 145* 82 162*  BUN  --   < > 38* 47* 29* 32* 35*  CREATININE  --   < > 3.60* 4.11* 3.23* 4.17* 4.90*  CALCIUM  --   < > 8.5 8.4 8.5 8.4 8.6  MG 1.6  --   --   --   --  1.8  --   < > = values in this interval not displayed. Liver Function Tests: No results found for this basename: AST, ALT, ALKPHOS, BILITOT, PROT, ALBUMIN,  in the last 168 hours No results found for this basename: LIPASE, AMYLASE,  in the last 168 hours No results found for this basename: AMMONIA,  in the last 168 hours CBC:  Recent Labs Lab 02/05/13 0014 02/06/13 0530 02/07/13 0600 02/08/13 0510 02/09/13 1040  WBC 0.3* 1.0* 2.4* 6.4 20.7*  HGB 9.4* 8.9* 10.7* 9.2* 9.5*  HCT 26.5* 25.4* 31.0* 27.4* 28.1*  MCV 78.4 78.6 79.7 80.4 80.7  PLT 14* 36* 22* 11* 25*   Cardiac Enzymes:  Recent Labs Lab 02/04/13 1752 02/05/13 0014 02/05/13 0506  TROPONINI <0.30 <0.30 <0.30   BNP (last 3 results) No results found for this basename: PROBNP,  in the last 8760 hours CBG:  Recent Labs Lab 02/08/13 1125 02/08/13 1630 02/08/13 2046 02/09/13 0748 02/09/13 1113  GLUCAP 207* 114* 223* 100* 141*    Recent Results (from the past 240 hour(s))  URINE CULTURE     Status: None   Collection Time    01/31/13 10:50 PM      Result Value Range Status   Specimen Description URINE, RANDOM   Final   Special Requests NONE   Final   Culture  Setup Time 02/01/2013 03:34   Final   Colony Count NO GROWTH   Final   Culture NO GROWTH   Final    Report Status 02/02/2013 FINAL   Final  CULTURE, BLOOD (ROUTINE X 2)     Status: None   Collection Time    02/01/13 12:14 AM      Result Value Range Status   Specimen Description BLOOD RIGHT ARM   Final   Special Requests BOTTLES DRAWN AEROBIC AND ANAEROBIC    Final   Culture  Setup Time 02/01/2013 13:44   Final   Culture NO GROWTH 5 DAYS   Final   Report Status 02/07/2013 FINAL  Final  CULTURE, BLOOD (ROUTINE X 2)     Status: None   Collection Time    02/01/13 12:14 AM      Result Value Range Status   Specimen Description BLOOD  PORT   Final   Special Requests BOTTLES DRAWN AEROBIC AND ANAEROBIC    Final   Culture  Setup Time 02/01/2013 13:44   Final   Culture NO GROWTH 5 DAYS   Final   Report Status 02/07/2013 FINAL   Final  MRSA PCR SCREENING     Status: None   Collection Time    02/04/13 12:36 PM      Result Value Range Status   MRSA by PCR NEGATIVE  NEGATIVE Final   Comment:            The GeneXpert MRSA Assay (FDA     approved for NASAL specimens     only), is one component of a     comprehensive MRSA colonization     surveillance program. It is not     intended to diagnose MRSA     infection nor to guide or     monitor treatment for     MRSA infections.     Studies:  Recent x-ray studies have been reviewed in detail by the Attending Physician  Scheduled Meds:  . antiseptic oral rinse  15 mL Mouth Rinse QID  . diltiazem  60 mg Oral Q6H  . docusate sodium  100 mg Oral BID  . insulin aspart  0-15 Units Subcutaneous TID WC  . insulin aspart  0-5 Units Subcutaneous QHS  . lactose free nutrition  237 mL Oral TID WC  . metoprolol tartrate  50 mg Oral BID  . pantoprazole  40 mg Oral Daily  . polyethylene glycol  17 g Oral Daily     Maddilyn Campus,MD [phone] 02/09/2013, 11:41 AM

## 2013-02-09 NOTE — Progress Notes (Signed)
[facility] ROUNDING NOTE   Subjective:   Interval History: awake alert memory slightly impaired  Objective:  Vital signs in last 24 hours:  Temp:  [98 F (36.7 C)-98.9 F (37.2 C)] 98.4 F (36.9 C) (04/14 1037) Pulse Rate:  [72-84] 84 (04/14 1037) Resp:  [18-20] 20 (04/14 1037) BP: (151-170)/(64-76) 168/75 mmHg (04/14 1037) SpO2:  [93 %-100 %] 96 % (04/14 1037)  Weight change:  Filed Weights   02/06/13 1841 02/07/13 0500 02/07/13 2129  Weight: 81.6 kg (179 lb 14.3 oz) 82.6 kg (182 lb 1.6 oz) 76.8 kg (169 lb 5 oz)    Intake/Output: I/O last 3 completed shifts: In: 580 [P.O.:340; I.V.:240] Out: 1075 [Urine:1075]   Intake/Output this shift:  Total I/O In: 240 [P.O.:240] Out: -   Gen: more alert, no distress, talkative  Skin: no rash, cyanosis  HEENT: EOMI, sclera anicteric, throat slightly dry  Neck: JVP 12 cm  Chest: clear bilat , no rales or rhonchi  CV: regular, no rub or gallop, no murmur, pedal pulses intact bilat  Abdomen: soft, nontender, no mass or HSM, liver down 3 cm, no ascites  Ext: no LE edema  Neuro: alert, Ox3, no focal deficit    Basic Metabolic Panel:  Recent Labs Lab 02/04/13 1730 02/05/13 0014 02/05/13 1800 02/06/13 0530 02/07/13 0600 02/08/13 0510  NA  --  135 138 139 140 142  K  --  4.7 3.8 3.7 3.6 3.4*  CL  --  106 103 106 104 105  CO2  --  20 25 24 26 27   GLUCOSE  --  155* 143* 110* 145* 82  BUN  --  44* 38* 47* 29* 32*  CREATININE  --  4.05* 3.60* 4.11* 3.23* 4.17*  CALCIUM  --  8.2* 8.5 8.4 8.5 8.4  MG 1.6  --   --   --   --  1.8    Liver Function Tests: No results found for this basename: AST, ALT, ALKPHOS, BILITOT, PROT, ALBUMIN,  in the last 168 hours No results found for this basename: LIPASE, AMYLASE,  in the last 168 hours No results found for this basename: AMMONIA,  in the last 168 hours  CBC:  Recent Labs Lab 02/04/13 0535 02/05/13 0014 02/06/13 0530 02/07/13 0600 02/08/13 0510  WBC 0.4* 0.3*  1.0* 2.4* 6.4  HGB 9.8* 9.4* 8.9* 10.7* 9.2*  HCT 27.4* 26.5* 25.4* 31.0* 27.4*  MCV 79.7 78.4 78.6 79.7 80.4  PLT 35* 14* 36* 22* 11*    Cardiac Enzymes:  Recent Labs Lab 02/04/13 1752 02/05/13 0014 02/05/13 0506  TROPONINI <0.30 <0.30 <0.30    BNP: No components found with this basename: POCBNP,   CBG:  Recent Labs Lab 02/08/13 0728 02/08/13 1125 02/08/13 1630 02/08/13 2046 02/09/13 0748  GLUCAP 85 207* 114* 223* 100*    Microbiology: Results for orders placed during the hospital encounter of 01/28/13  URINE CULTURE     Status: None   Collection Time    01/28/13 10:55 PM      Result Value Range Status   Specimen Description URINE, CLEAN CATCH   Final   Special Requests NONE   Final   Culture  Setup Time 01/29/2013 09:26   Final   Colony Count NO GROWTH   Final   Culture NO GROWTH   Final   Report Status 01/30/2013 FINAL   Final  URINE CULTURE     Status: None   Collection Time    01/31/13 10:50 PM  Result Value Range Status   Specimen Description URINE, RANDOM   Final   Special Requests NONE   Final   Culture  Setup Time 02/01/2013 03:34   Final   Colony Count NO GROWTH   Final   Culture NO GROWTH   Final   Report Status 02/02/2013 FINAL   Final  CULTURE, BLOOD (ROUTINE X 2)     Status: None   Collection Time    02/01/13 12:14 AM      Result Value Range Status   Specimen Description BLOOD RIGHT ARM   Final   Special Requests BOTTLES DRAWN AEROBIC AND ANAEROBIC    Final   Culture  Setup Time 02/01/2013 13:44   Final   Culture NO GROWTH 5 DAYS   Final   Report Status 02/07/2013 FINAL   Final  CULTURE, BLOOD (ROUTINE X 2)     Status: None   Collection Time    02/01/13 12:14 AM      Result Value Range Status   Specimen Description BLOOD  PORT   Final   Special Requests BOTTLES DRAWN AEROBIC AND ANAEROBIC    Final   Culture  Setup Time 02/01/2013 13:44   Final   Culture NO GROWTH 5 DAYS   Final   Report Status 02/07/2013 FINAL   Final   MRSA PCR SCREENING     Status: None   Collection Time    02/04/13 12:36 PM      Result Value Range Status   MRSA by PCR NEGATIVE  NEGATIVE Final   Comment:            The GeneXpert MRSA Assay (FDA     approved for NASAL specimens     only), is one component of a     comprehensive MRSA colonization     surveillance program. It is not     intended to diagnose MRSA     infection nor to guide or     monitor treatment for     MRSA infections.    Coagulation Studies: No results found for this basename: LABPROT, INR,  in the last 72 hours  Urinalysis: No results found for this basename: COLORURINE, APPERANCEUR, LABSPEC, PHURINE, GLUCOSEU, HGBUR, BILIRUBINUR, KETONESUR, PROTEINUR, UROBILINOGEN, NITRITE, LEUKOCYTESUR,  in the last 72 hours    Imaging: No results found.   Medications:     . antiseptic oral rinse  15 mL Mouth Rinse QID  . diltiazem  60 mg Oral Q6H  . docusate sodium  100 mg Oral BID  . insulin aspart  0-15 Units Subcutaneous TID WC  . insulin aspart  0-5 Units Subcutaneous QHS  . lactose free nutrition  237 mL Oral TID WC  . metoprolol tartrate  50 mg Oral BID  . pantoprazole  40 mg Oral Daily  . polyethylene glycol  17 g Oral Daily   sodium chloride, sodium chloride, calcium carbonate, feeding supplement (NEPRO CARB STEADY), heparin, lidocaine (PF), lidocaine-prilocaine, LORazepam, metoCLOPramide (REGLAN) injection, ondansetron, pentafluoroprop-tetrafluoroeth, traMADol, zolpidem  Assessment/ Plan:  1. Acute on chronic renal failure- suspect due to chemoRx (ifosfamide/carboplatin) in setting of CKD III / ACEI. No other cause noted. Nonoliguric, s/p HD x 3 with improved sensorium. Creatinine rising will follow 2.  Platelets are low which may complicate things. HD ordered for Monday after cath placement 3. CKD III, baseline eGFR 50-60 ml/min 4. HTN- on metoprolol only. Let BP run on high side. Euvolemic 5. Recurrent NHL- s/p first round chemo  3/28-3/30 6.  Pancytopenia- due to chemoRx, WBC improving, plts still low 11k  Will hold off on catheter today and follow    LOS: 12 [name] W @TODAY @11 :04 AM

## 2013-02-09 NOTE — Progress Notes (Signed)
CRITICAL VALUE ALERT  Critical value received:  Platelets 25   Date of notification:  02/09/2013  Time of notification:  1112  Critical value read back:yes  Nurse who received alert:  Toma Copier   MD notified (1st page):  Dr. Lavera Guise notified via text page  Time of first page:  1124  MD notified (2nd page):   Time of second page:  Responding MD:    Time MD responded:

## 2013-02-09 NOTE — Progress Notes (Signed)
Mrs. Diekman continues to improve from my point of view. Unfortunately, her kidney function show some decreased over the weekend. She was not dialyzed. She is going for a dialysis catheter today. A temporary one was removed over the weekend.  Her white cell count was 6.4 yesterday. She is off Neupogen. Her platelets are still quite low. Blood order some if she is going to have this dialysis catheter placed. Hemoglobin is 9.2.  She did do some physical therapy over the weekend. This made her feel better. She will get this at home.  She felt that she may be eating a little bit better over the weekend.  She is off antibiotics.  On her physical exam, her blood pressure still a high side at 165/68. Temperature is 98.9. Pulse is regular at 81. Oral exam shows healing ulcers about her lips. No thrush or mucositis is noted. Lungs are clear. Cardiac exam regular rate and rhythm with no murmurs rubs or bruits. Abdominal exam soft. She has good bowel sounds. There is no fluid wave. There may be some slight tenderness over in the left side. There is no palpable hepatospleno megaly. Extremities shows no clubbing cyanosis or edema. Neurological exam shows baseline mental state.  There are no labs yet back.   if she is going having a dialysis catheter, she can have platelets infused right before the procedure. She does not need a irradiated  platelets.  It would be nice if she did not need a dialysis catheter.  I appreciate the great help by the specialists.  Hopefully, she'll be able to go home this week. She really looks close to her baseline. Hopefully, her renal function will at least stabilize.  Hewitt Shorts  1 John 1:7-9

## 2013-02-10 DIAGNOSIS — K1230 Oral mucositis (ulcerative), unspecified: Secondary | ICD-10-CM

## 2013-02-10 LAB — CBC
MCH: 27.5 pg (ref 26.0–34.0)
MCHC: 34.4 g/dL (ref 30.0–36.0)
MCV: 80 fL (ref 78.0–100.0)
Platelets: 38 10*3/uL — ABNORMAL LOW (ref 150–400)
RBC: 3.2 MIL/uL — ABNORMAL LOW (ref 3.87–5.11)
RDW: 20.2 % — ABNORMAL HIGH (ref 11.5–15.5)

## 2013-02-10 LAB — GLUCOSE, CAPILLARY
Glucose-Capillary: 90 mg/dL (ref 70–99)
Glucose-Capillary: 151 mg/dL — ABNORMAL HIGH (ref 70–99)
Glucose-Capillary: 111 mg/dL — ABNORMAL HIGH (ref 70–99)

## 2013-02-10 LAB — RENAL FUNCTION PANEL
Albumin: 2.1 g/dL — ABNORMAL LOW (ref 3.5–5.2)
BUN: 36 mg/dL — ABNORMAL HIGH (ref 6–23)
Calcium: 8.5 mg/dL (ref 8.4–10.5)
Creatinine, Ser: 5.33 mg/dL — ABNORMAL HIGH (ref 0.50–1.10)
GFR calc non Af Amer: 7 mL/min — ABNORMAL LOW (ref >90)
Phosphorus: 2.1 mg/dL — ABNORMAL LOW (ref 2.3–4.6)

## 2013-02-10 MED ORDER — SODIUM CHLORIDE 0.9 % IJ SOLN
10.0000 mL | INTRAMUSCULAR | Status: DC | PRN
  Administered 2013-02-10 – 2013-02-13 (×2): 10 mL

## 2013-02-10 NOTE — Progress Notes (Signed)
[facility] ROUNDING NOTE   Subjective:   Interval History: alert no complaints  Objective:  Vital signs in last 24 hours:  Temp:  [97.7 F (36.5 C)-98.8 F (37.1 C)] 97.8 F (36.6 C) (04/15 1041) Pulse Rate:  [70-147] 78 (04/15 1041) Resp:  [18-20] 18 (04/15 1041) BP: (148-169)/(60-88) 165/60 mmHg (04/15 1041) SpO2:  [94 %-98 %] 94 % (04/15 1041) Weight:  [76.6 kg (168 lb 14 oz)] 76.6 kg (168 lb 14 oz) (04/14 2033)  Weight change:  Filed Weights   02/07/13 0500 02/07/13 2129 02/09/13 2033  Weight: 82.6 kg (182 lb 1.6 oz) 76.8 kg (169 lb 5 oz) 76.6 kg (168 lb 14 oz)    Intake/Output: I/O last 3 completed shifts: In: 720 [P.O.:720] Out: -    Intake/Output this shift:  Total I/O In: 240 [P.O.:240] Out: 575 [Urine:575]  CVS- RRR RS- CTA ABD- BS present soft non-distended EXT- no edema   Basic Metabolic Panel:  Recent Labs Lab 02/04/13 1730  02/06/13 0530 02/07/13 0600 02/08/13 0510 02/09/13 1040 02/10/13 0500  NA  --   < > 139 140 142 141 141  K  --   < > 3.7 3.6 3.4* 3.7 3.2*  CL  --   < > 106 104 105 104 104  CO2  --   < > 24 26 27 26 25   GLUCOSE  --   < > 110* 145* 82 162* 83  BUN  --   < > 47* 29* 32* 35* 36*  CREATININE  --   < > 4.11* 3.23* 4.17* 4.90* 5.33*  CALCIUM  --   < > 8.4 8.5 8.4 8.6 8.5  MG 1.6  --   --   --  1.8  --   --   PHOS  --   --   --   --   --   --  2.1*  < > = values in this interval not displayed.  Liver Function Tests:  Recent Labs Lab 02/10/13 0500  ALBUMIN 2.1*   No results found for this basename: LIPASE, AMYLASE,  in the last 168 hours No results found for this basename: AMMONIA,  in the last 168 hours  CBC:  Recent Labs Lab 02/06/13 0530 02/07/13 0600 02/08/13 0510 02/09/13 1040 02/10/13 0500  WBC 1.0* 2.4* 6.4 20.7* 21.8*  HGB 8.9* 10.7* 9.2* 9.5* 8.8*  HCT 25.4* 31.0* 27.4* 28.1* 25.6*  MCV 78.6 79.7 80.4 80.7 80.0  PLT 36* 22* 11* 25* 38*    Cardiac Enzymes:  Recent Labs Lab  02/04/13 1752 02/05/13 0014 02/05/13 0506  TROPONINI <0.30 <0.30 <0.30    BNP: No components found with this basename: POCBNP,   CBG:  Recent Labs Lab 02/09/13 0748 02/09/13 1113 02/09/13 1612 02/09/13 2032 02/10/13 0739  GLUCAP 100* 141* 122* 111* 90    Microbiology: Results for orders placed during the hospital encounter of 01/28/13  URINE CULTURE     Status: None   Collection Time    01/28/13 10:55 PM      Result Value Range Status   Specimen Description URINE, CLEAN CATCH   Final   Special Requests NONE   Final   Culture  Setup Time 01/29/2013 09:26   Final   Colony Count NO GROWTH   Final   Culture NO GROWTH   Final   Report Status 01/30/2013 FINAL   Final  URINE CULTURE     Status: None   Collection Time    01/31/13 10:50  PM      Result Value Range Status   Specimen Description URINE, RANDOM   Final   Special Requests NONE   Final   Culture  Setup Time 02/01/2013 03:34   Final   Colony Count NO GROWTH   Final   Culture NO GROWTH   Final   Report Status 02/02/2013 FINAL   Final  CULTURE, BLOOD (ROUTINE X 2)     Status: None   Collection Time    02/01/13 12:14 AM      Result Value Range Status   Specimen Description BLOOD RIGHT ARM   Final   Special Requests BOTTLES DRAWN AEROBIC AND ANAEROBIC    Final   Culture  Setup Time 02/01/2013 13:44   Final   Culture NO GROWTH 5 DAYS   Final   Report Status 02/07/2013 FINAL   Final  CULTURE, BLOOD (ROUTINE X 2)     Status: None   Collection Time    02/01/13 12:14 AM      Result Value Range Status   Specimen Description BLOOD  PORT   Final   Special Requests BOTTLES DRAWN AEROBIC AND ANAEROBIC    Final   Culture  Setup Time 02/01/2013 13:44   Final   Culture NO GROWTH 5 DAYS   Final   Report Status 02/07/2013 FINAL   Final  MRSA PCR SCREENING     Status: None   Collection Time    02/04/13 12:36 PM      Result Value Range Status   MRSA by PCR NEGATIVE  NEGATIVE Final   Comment:            The  GeneXpert MRSA Assay (FDA     approved for NASAL specimens     only), is one component of a     comprehensive MRSA colonization     surveillance program. It is not     intended to diagnose MRSA     infection nor to guide or     monitor treatment for     MRSA infections.    Coagulation Studies: No results found for this basename: LABPROT, INR,  in the last 72 hours  Urinalysis: No results found for this basename: COLORURINE, APPERANCEUR, LABSPEC, PHURINE, GLUCOSEU, HGBUR, BILIRUBINUR, KETONESUR, PROTEINUR, UROBILINOGEN, NITRITE, LEUKOCYTESUR,  in the last 72 hours    Imaging: No results found.   Medications:     . antiseptic oral rinse  15 mL Mouth Rinse QID  . diltiazem  60 mg Oral Q6H  . docusate sodium  100 mg Oral BID  . insulin aspart  0-15 Units Subcutaneous TID WC  . insulin aspart  0-5 Units Subcutaneous QHS  . lactose free nutrition  237 mL Oral TID WC  . metoprolol tartrate  50 mg Oral BID  . pantoprazole  40 mg Oral Daily  . polyethylene glycol  17 g Oral Daily   sodium chloride, sodium chloride, calcium carbonate, feeding supplement (NEPRO CARB STEADY), heparin, lidocaine (PF), lidocaine-prilocaine, LORazepam, metoCLOPramide (REGLAN) injection, ondansetron, pentafluoroprop-tetrafluoroeth, sodium chloride, traMADol, zolpidem  Assessment/ Plan:  1. Acute on chronic renal failure- suspect due to chemoRx (ifosfamide/carboplatin) in setting of CKD III / ACEI. No other cause noted. Nonoliguric, s/p HD x 3 with improved sensorium. Creatinine rising will follow 2. Platelets are low which may complicate things. HD ordered for Monday after cath placement 3. CKD III, baseline eGFR 50-60 ml/min 4. HTN- on metoprolol only. Let BP run on high side. Euvolemic 5. Recurrent NHL-  s/p first round chemo 3/28-3/30 6. Pancytopenia- due to chemoRx, WBC improving, plts still low 11k    Will continue to follow   LOS: 13 Karlin Binion W @TODAY @10 :55 AM

## 2013-02-10 NOTE — Progress Notes (Signed)
[facility] Progress Note    [name] ZOX:[id number] DOB: May 02, 1941 DOA: 01/28/2013 PCP: [name], MD  Brief narrative:  72 year old white woman initially admitted to [facility] on 01/28/13 by Dr. 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 She developed acute renal failure that has worsened to the point where she was uremic, with increased confusion and jerking movements of her upper and lower extremities. Her creatinine was up to 4.3 from 1.9 on admission. Renal ultrasound without evidence for obstruction or hydronephrosis. She has been followed by Dr. Arlean Hopping who has requested transfer to [facility] to (4/9) to start dialysis given her uremic symptoms. [facility] was asked to assist with transfer. Multiple family members present at time of admitting MD interview including her 3 children.  Assessment/Plan:    ARF (acute renal failure) on CKD (chronic kidney disease) stage 2, GFR 60-89 ml/min/ATN (acute tubular necrosis) -started HD this admit via femoral access - fem cath out on 02/08/13  -HD for 4/9 interrupted by sx AF/RVR but has tolerated at bedside much better  (4/10) - dialyzed 4/11 then held and monitor response - making urine 1200cc/ 4/14  -? ATN from chemorxn   Febrile neutropenia  Received full antibiotic treatment  With cefepime and primaxin for 1 week - cultures negative. stoped abx 4/12   Pancytopenia from chemo Received neulasta and neutropenia has resolved by 02/08/13 Thrombocytopenia started to improve  Nadir 11 k on 4/13   Atrial fibrillation with RVR -was on Pradaxa pre admit but was dc'd due to thrombocytopenia - will need to resume once plts up  -RVR during 1st HD tx-resolved -required CCB gtt then converted to NSR so have transitioned to po BB and CCB Back into afib 4/12  titrated up CCB Back into NSR 4/13  Back into afib with rvr 4/14  Back into NSR on 4/15       Large cell (diffuse) non-Hodgkin's lymphoma/  Antineoplastic chemotherapy induced pancytopenia - s/p R-ICE 01/23/13  -per oncology  -transfused platelets and received Neupogen (4/10)     Type II or unspecified type diabetes mellitus with ketoacidosis, controlled -CBG's controlled during admit with SSI -was on Amaryl pre admit    History of CAD/HTN -Pradaxa on hold -intolerant to statins -ACE I on hold due to ARF requiring HD- will NOT resume    Nausea and vomiting/Anorexia -due to recent chemo tx and underlying uremia - resolved     Abdominal  pain, other specified site -resolved    Constipation due to pain medication - resolved   DVT prophylaxis: SCDs-unable to proceed with pharmacological prophylaxis despite underlying malignancy secondary to thrombocytopenia  Code Status: Full Family Communication: Patient  Disposition Plan: home    Consultants: Nephrology Oncology  Procedures: Right femoral dialysis catheter placement with ultrasound guidance  (4/9)- removed 4/13   Antibiotics: Primaxin 4/8 >>>4/12 Diflucan 4/5 >>>4/12  Maxipime 4/5 >>> 4/8  HPI/Subjective: Feels great   Objective: Blood pressure 161/74, pulse 70, temperature 97.7 F (36.5 C), temperature source Oral, resp. rate 20, height 5\' 2"  (1.575 m), weight 76.6 kg (168 lb 14 oz), SpO2 95.00%.  Intake/Output Summary (Last 24 hours) at 02/10/13 0928 Last data filed at 02/10/13 0748  Gross per 24 hour  Intake    480 ml  Output    300 ml  Net    180 ml    Patient Vitals for the past 24 hrs:  BP Temp Temp src  Pulse Resp SpO2 Weight  02/10/13 0442 161/74 mmHg 97.7 F (36.5 C) Oral 70 20 95 % -  02/09/13 2033 154/66 mmHg 98.3 F (36.8 C) Oral 76 18 98 % 76.6 kg (168 lb 14 oz)  02/09/13 1812 148/84 mmHg 98.5 F (36.9 C) - 139 18 96 % -  02/09/13 1318 169/88 mmHg 98.8 F (37.1 C) Oral 147 - 97 % -  02/09/13 1310 164/70 mmHg 98.2 F (36.8 C) Oral 80 20 96 % -  02/09/13 1037 168/75 mmHg  98.4 F (36.9 C) Oral 84 20 96 % -     Exam: Lips with blisters  Lungs: Clear to auscultation bilaterally without wheezes and a few fine bibasilar crackles, RA Cardiovascular: Regular rate and rhythm without murmur gallop or rub normal S1 and S2, no peripheral edema or JVD Abdomen: non tender  , nondistended, soft, bowel sounds positive, no rebound,   Data Reviewed: Basic Metabolic Panel:  Recent Labs Lab 02/04/13 1730  02/06/13 0530 02/07/13 0600 02/08/13 0510 02/09/13 1040 02/10/13 0500  NA  --   < > 139 140 142 141 141  K  --   < > 3.7 3.6 3.4* 3.7 3.2*  CL  --   < > 106 104 105 104 104  CO2  --   < > 24 26 27 26 25   GLUCOSE  --   < > 110* 145* 82 162* 83  BUN  --   < > 47* 29* 32* 35* 36*  CREATININE  --   < > 4.11* 3.23* 4.17* 4.90* 5.33*  CALCIUM  --   < > 8.4 8.5 8.4 8.6 8.5  MG 1.6  --   --   --  1.8  --   --   PHOS  --   --   --   --   --   --  2.1*  < > = values in this interval not displayed. Liver Function Tests:  Recent Labs Lab 02/10/13 0500  ALBUMIN 2.1*   No results found for this basename: LIPASE, AMYLASE,  in the last 168 hours No results found for this basename: AMMONIA,  in the last 168 hours CBC:  Recent Labs Lab 02/06/13 0530 02/07/13 0600 02/08/13 0510 02/09/13 1040 02/10/13 0500  WBC 1.0* 2.4* 6.4 20.7* 21.8*  HGB 8.9* 10.7* 9.2* 9.5* 8.8*  HCT 25.4* 31.0* 27.4* 28.1* 25.6*  MCV 78.6 79.7 80.4 80.7 80.0  PLT 36* 22* 11* 25* 38*   Cardiac Enzymes:  Recent Labs Lab 02/04/13 1752 02/05/13 0014 02/05/13 0506  TROPONINI <0.30 <0.30 <0.30   BNP (last 3 results) No results found for this basename: PROBNP,  in the last 8760 hours CBG:  Recent Labs Lab 02/09/13 0748 02/09/13 1113 02/09/13 1612 02/09/13 2032 02/10/13 0739  GLUCAP 100* 141* 122* 111* 90    Recent Results (from the past 240 hour(s))  URINE CULTURE     Status: None   Collection Time    01/31/13 10:50 PM      Result Value Range Status   Specimen  Description URINE, RANDOM   Final   Special Requests NONE   Final   Culture  Setup Time 02/01/2013 03:34   Final   Colony Count NO GROWTH   Final   Culture NO GROWTH   Final   Report Status 02/02/2013 FINAL   Final  CULTURE, BLOOD (ROUTINE X 2)     Status: None   Collection Time    02/01/13 12:14 AM  Result Value Range Status   Specimen Description BLOOD RIGHT ARM   Final   Special Requests BOTTLES DRAWN AEROBIC AND ANAEROBIC    Final   Culture  Setup Time 02/01/2013 13:44   Final   Culture NO GROWTH 5 DAYS   Final   Report Status 02/07/2013 FINAL   Final  CULTURE, BLOOD (ROUTINE X 2)     Status: None   Collection Time    02/01/13 12:14 AM      Result Value Range Status   Specimen Description BLOOD  PORT   Final   Special Requests BOTTLES DRAWN AEROBIC AND ANAEROBIC    Final   Culture  Setup Time 02/01/2013 13:44   Final   Culture NO GROWTH 5 DAYS   Final   Report Status 02/07/2013 FINAL   Final  MRSA PCR SCREENING     Status: None   Collection Time    02/04/13 12:36 PM      Result Value Range Status   MRSA by PCR NEGATIVE  NEGATIVE Final   Comment:            The GeneXpert MRSA Assay (FDA     approved for NASAL specimens     only), is one component of a     comprehensive MRSA colonization     surveillance program. It is not     intended to diagnose MRSA     infection nor to guide or     monitor treatment for     MRSA infections.     Studies:  Recent x-ray studies have been reviewed in detail by the Attending Physician  Scheduled Meds:  . antiseptic oral rinse  15 mL Mouth Rinse QID  . diltiazem  60 mg Oral Q6H  . docusate sodium  100 mg Oral BID  . insulin aspart  0-15 Units Subcutaneous TID WC  . insulin aspart  0-5 Units Subcutaneous QHS  . lactose free nutrition  237 mL Oral TID WC  . metoprolol tartrate  50 mg Oral BID  . pantoprazole  40 mg Oral Daily  . polyethylene glycol  17 g Oral Daily     Chasitie Passey,MD [phone] 02/10/2013, 9:28  AM

## 2013-02-10 NOTE — Progress Notes (Signed)
CBC is recovering nicely. Platelets are started come up. White cell count 21,000. She is off growth factor support.  Her renal function is to be the issue now. I do not see her metabolic panel from today. She may need to have a dialysis catheter placed.  After, her creatinine is now to 5.33. I suspect that she may need to have dialysis. Hopefully, this might be able to be done as an outpatient at some point this week.  Her appetite is okay. There's no nausea vomiting.  She did well with physical therapy. She is pleased with her progress.  On physical exam, her vital signs are stable. Blood pressure 161/74.  Her mucositis about the lips is improving nicely. There are no intraoral lesions. Lungs are clear. Cardiac exam regular rate and rhythm. Abdomen is soft. She has decent bowel sounds. Extremities shows no edema. Skin exam shows no rashes.  Her immune system is getting close to baseline now. She is a recovered her marrow function from the chemotherapy.  I displaced that her kidneys we start to work better. I think this will be the limiting factor with respect to her going home.  Diana E.  2 Cor 10:12

## 2013-02-11 DIAGNOSIS — E876 Hypokalemia: Secondary | ICD-10-CM

## 2013-02-11 LAB — RENAL FUNCTION PANEL
Albumin: 1.6 g/dL — ABNORMAL LOW (ref 3.5–5.2)
BUN: 29 mg/dL — ABNORMAL HIGH (ref 6–23)
Calcium: 5.9 mg/dL — CL (ref 8.4–10.5)
GFR calc Af Amer: 11 mL/min — ABNORMAL LOW (ref >90)
Glucose, Bld: 74 mg/dL (ref 70–99)
Phosphorus: 1.9 mg/dL — ABNORMAL LOW (ref 2.3–4.6)
Sodium: 146 mEq/L — ABNORMAL HIGH (ref 135–145)

## 2013-02-11 LAB — CBC
HCT: 19.1 % — ABNORMAL LOW (ref 36.0–46.0)
Hemoglobin: 6.7 g/dL — CL (ref 12.0–15.0)
WBC: 16.6 10*3/uL — ABNORMAL HIGH (ref 4.0–10.5)

## 2013-02-11 LAB — GLUCOSE, CAPILLARY
Glucose-Capillary: 115 mg/dL — ABNORMAL HIGH (ref 70–99)
Glucose-Capillary: 135 mg/dL — ABNORMAL HIGH (ref 70–99)

## 2013-02-11 LAB — PREPARE RBC (CROSSMATCH)

## 2013-02-11 MED ORDER — POTASSIUM CHLORIDE 10 MEQ/100ML IV SOLN
10.0000 meq | INTRAVENOUS | Status: AC
  Administered 2013-02-11 (×4): 10 meq via INTRAVENOUS
  Filled 2013-02-11 (×5): qty 100

## 2013-02-11 MED ORDER — POTASSIUM CHLORIDE 10 MEQ/100ML IV SOLN
10.0000 meq | INTRAVENOUS | Status: DC
  Filled 2013-02-11 (×3): qty 100

## 2013-02-11 MED ORDER — FENTANYL CITRATE 0.05 MG/ML IJ SOLN
12.5000 ug | INTRAMUSCULAR | Status: DC | PRN
  Administered 2013-02-11 – 2013-02-12 (×2): 12.5 ug via INTRAVENOUS
  Filled 2013-02-11 (×2): qty 2

## 2013-02-11 MED ORDER — ACETAMINOPHEN 500 MG PO TABS
500.0000 mg | ORAL_TABLET | Freq: Four times a day (QID) | ORAL | Status: DC | PRN
  Administered 2013-02-11: 500 mg via ORAL
  Filled 2013-02-11 (×3): qty 1

## 2013-02-11 MED ORDER — FUROSEMIDE 10 MG/ML IJ SOLN
60.0000 mg | Freq: Once | INTRAMUSCULAR | Status: AC
  Administered 2013-02-11: 60 mg via INTRAVENOUS
  Filled 2013-02-11: qty 6

## 2013-02-11 MED ORDER — TRAMADOL HCL 50 MG PO TABS
50.0000 mg | ORAL_TABLET | Freq: Four times a day (QID) | ORAL | Status: DC | PRN
  Administered 2013-02-11 – 2013-02-13 (×4): 50 mg via ORAL
  Filled 2013-02-11 (×4): qty 1

## 2013-02-11 MED ORDER — ACETAMINOPHEN 325 MG PO TABS
650.0000 mg | ORAL_TABLET | Freq: Once | ORAL | Status: AC
  Administered 2013-02-11: 650 mg via ORAL
  Filled 2013-02-11: qty 2

## 2013-02-11 NOTE — Progress Notes (Signed)
[name] feels well this morning. She has no nausea vomiting. There is still some abdominal discomfort. She was told that there is no Ultram in the hospital. I find this hard to believe. I'll talk to  pharmacy pharmacy about this.  She is out of bed a little bit more. She was physical therapy today.  She did not get dialysis yesterday. Her renal function is not back yet today. We'll see if she needs any today.  She really wants to go home. Maybe she can go home.  She does have the atrial fibrillation. Because her kidney function, she cannot go back onto Pradaxa. I would be very cautious about put her back on any kind of anticoagulation. Am I sure, platelet recovery she'll have. As such, I think that we should just keep her off blood thinner for right now.  Her hemoglobin 6.7. She will need be transfused today.  I did speak with pharmacy. They do have Ultram. As such, I hope that she will be able to receive this.  Her blood pressure still a high side. I appreciate nephrology and the hospitalist managing this.  Her blood sugars are okay. I don't know if she is any adjustment of her oral hypoglycemic agents because of her renal function.  Again, she really would like to go home. From a oncologic point of view, I think she can. The other issues just need to be sorted out. A  Hewitt Shorts  [place] 3:19-20

## 2013-02-11 NOTE — Progress Notes (Signed)
[facility] PROGRESS NOTE  [name] UUV:[id number] DOB: 07-27-1941 DOA: 01/28/2013 PCP: [name], MD  Assessment/Plan:  ARF (acute renal failure) on CKD (chronic kidney disease) stage 2, GFR 60-89 ml/min/ATN (acute tubular necrosis)  -started HD this admit via femoral access - fem cath out on 02/08/13  - dialyzed 4/11 then held and monitor response.  -? ATN from chemorxn  -Hypokalemia: replaced with IV 4 runs.  -good urine out put.  -Disposition per renal.   Anemia: Hb decrease to 6. Patient to received 2 units PRBC 4-16.   Febrile neutropenia  Received full antibiotic treatment With cefepime and primaxin for 1 week - cultures negative. stoped abx 4/12  Pancytopenia from chemo  Received neulasta and neutropenia has resolved by 02/08/13  Thrombocytopenia started to improve  Nadir 11 k on 4/13  Atrial fibrillation with RVR  -was on Pradaxa pre admit but was dc'd due to thrombocytopenia. -will need to follow up with Dr Myna Hidalgo outpatient prior to considering resume anticoagulation. Per Dr Myna Hidalgo note, he would continue to hold anticoagulation.  -RVR during 1st HD tx-resolved  -required CCB gtt then converted to NSR so have transitioned to po BB and CCB  Large cell (diffuse) non-Hodgkin's lymphoma/ Antineoplastic chemotherapy induced pancytopenia  - s/p R-ICE 01/23/13  -per oncology  -transfused platelets and received Neupogen (4/10)  Type II or unspecified type diabetes mellitus with ketoacidosis, controlled  -was on Amaryl pre admit  History of CAD/HTN  -Pradaxa on hold  -intolerant to statins  -ACE I on hold due to ARF requiring HD- will NOT resume  Nausea and vomiting/Anorexia  -due to recent chemo tx and underlying uremia  - resolved  Abdominal pain, other specified site  -resolved  Constipation due to pain medication  - resolved     DVT prophylaxis: SCDs-unable to proceed with pharmacological prophylaxis despite underlying malignancy secondary to  thrombocytopenia  Code Status: Full  Family Communication: patient and husband who was at bedside.  Disposition Plan: home   Consultants: Nephrology  Oncology   Procedures: Right femoral dialysis catheter placement with ultrasound guidance (4/9)- removed 4/13    Antibiotics: Primaxin 4/8 >>>4/12  Diflucan 4/5 >>>4/12  Maxipime 4/5 >>> 4/8   HPI/Subjective: Feeling well. No complaints.  Was complaining of abdominal pain earlier, chronic pain.   Objective: Filed Vitals:   02/10/13 1743 02/10/13 2100 02/11/13 0504 02/11/13 0939  BP: 164/82 172/64 170/74 171/74  Pulse: 69 76 77 69  Temp: 98.4 F (36.9 C) 98.7 F (37.1 C) 98.2 F (36.8 C) 97.5 F (36.4 C)  TempSrc:  Oral Oral Oral  Resp: 18 20 20 17   Height:      Weight:  76.5 kg (168 lb 10.4 oz)    SpO2: 95% 96% 95% 94%    Intake/Output Summary (Last 24 hours) at 02/11/13 1221 Last data filed at 02/11/13 0900  Gross per 24 hour  Intake    480 ml  Output   1227 ml  Net   -747 ml   Filed Weights   02/07/13 2129 02/09/13 2033 02/10/13 2100  Weight: 76.8 kg (169 lb 5 oz) 76.6 kg (168 lb 14 oz) 76.5 kg (168 lb 10.4 oz)    Exam:   General:  No distress.  Cardiovascular: S 1, S 2 RRR  Respiratory: CTA  Abdomen: bs present, soft, NT  Musculoskeletal: no edema.   Data Reviewed: Basic Metabolic Panel:  Recent Labs Lab 02/04/13 1730  02/07/13 0600 02/08/13 0510 02/09/13 1040 02/10/13 0500 02/11/13 4742  NA  --   < > 140 142 141 141 146*  K  --   < > 3.6 3.4* 3.7 3.2* 2.1*  CL  --   < > 104 105 104 104 117*  CO2  --   < > 26 27 26 25 20   GLUCOSE  --   < > 145* 82 162* 83 74  BUN  --   < > 29* 32* 35* 36* 29*  CREATININE  --   < > 3.23* 4.17* 4.90* 5.33* 4.31*  CALCIUM  --   < > 8.5 8.4 8.6 8.5 5.9*  MG 1.6  --   --  1.8  --   --   --   PHOS  --   --   --   --   --  2.1* 1.9*  < > = values in this interval not displayed. Liver Function Tests:  Recent Labs Lab 02/10/13 0500 02/11/13 0610   ALBUMIN 2.1* 1.6*   No results found for this basename: LIPASE, AMYLASE,  in the last 168 hours No results found for this basename: AMMONIA,  in the last 168 hours CBC:  Recent Labs Lab 02/07/13 0600 02/08/13 0510 02/09/13 1040 02/10/13 0500 02/11/13 0610  WBC 2.4* 6.4 20.7* 21.8* 16.6*  HGB 10.7* 9.2* 9.5* 8.8* 6.7*  HCT 31.0* 27.4* 28.1* 25.6* 19.1*  MCV 79.7 80.4 80.7 80.0 80.3  PLT 22* 11* 25* 38* 44*   Cardiac Enzymes:  Recent Labs Lab 02/04/13 1752 02/05/13 0014 02/05/13 0506  TROPONINI <0.30 <0.30 <0.30   BNP (last 3 results) No results found for this basename: PROBNP,  in the last 8760 hours CBG:  Recent Labs Lab 02/10/13 1140 02/10/13 1659 02/10/13 2126 02/11/13 0739 02/11/13 1135  GLUCAP 151* 128* 111* 115* 169*    Recent Results (from the past 240 hour(s))  MRSA PCR SCREENING     Status: None   Collection Time    02/04/13 12:36 PM      Result Value Range Status   MRSA by PCR NEGATIVE  NEGATIVE Final   Comment:            The GeneXpert MRSA Assay (FDA     approved for NASAL specimens     only), is one component of a     comprehensive MRSA colonization     surveillance program. It is not     intended to diagnose MRSA     infection nor to guide or     monitor treatment for     MRSA infections.     Studies: No results found.  Scheduled Meds: . acetaminophen  650 mg Oral Once  . antiseptic oral rinse  15 mL Mouth Rinse QID  . diltiazem  60 mg Oral Q6H  . docusate sodium  100 mg Oral BID  . furosemide  60 mg Intravenous Once  . insulin aspart  0-15 Units Subcutaneous TID WC  . insulin aspart  0-5 Units Subcutaneous QHS  . lactose free nutrition  237 mL Oral TID WC  . metoprolol tartrate  50 mg Oral BID  . pantoprazole  40 mg Oral Daily  . polyethylene glycol  17 g Oral Daily  . potassium chloride  10 mEq Intravenous Q1 Hr x 4   Continuous Infusions:   Active Problems:   Large cell (diffuse) non-Hodgkin's lymphoma   Nausea and  vomiting   Abdominal  pain, other specified site   Type II or unspecified type diabetes mellitus with ketoacidosis, not  stated as uncontrolled   ARF (acute renal failure)   Antineoplastic chemotherapy induced pancytopenia   CKD (chronic kidney disease) stage 2, GFR 60-89 ml/min   New onset atrial fibrillation with RVR   Anorexia   Constipation due to pain medication   ATN (acute tubular necrosis)    Time spent: 35 minutes.     Verma Grothaus  [facility] Pager [phone]. If 7PM-7AM, please contact night-coverage at [email or web address], password [facility] 02/11/2013, 12:21 PM  LOS: 14 days

## 2013-02-11 NOTE — Progress Notes (Addendum)
[facility] ROUNDING NOTE   Subjective:   Interval History: much brighter this morning   Objective:  Vital signs in last 24 hours:  Temp:  [97.5 F (36.4 C)-98.7 F (37.1 C)] 97.5 F (36.4 C) (04/16 0939) Pulse Rate:  [68-78] 69 (04/16 0939) Resp:  [17-20] 17 (04/16 0939) BP: (152-172)/(57-82) 171/74 mmHg (04/16 0939) SpO2:  [94 %-98 %] 94 % (04/16 0939) Weight:  [76.5 kg (168 lb 10.4 oz)] 76.5 kg (168 lb 10.4 oz) (04/15 2100)  Weight change: -0.1 kg (-3.5 oz) Filed Weights   02/07/13 2129 02/09/13 2033 02/10/13 2100  Weight: 76.8 kg (169 lb 5 oz) 76.6 kg (168 lb 14 oz) 76.5 kg (168 lb 10.4 oz)    Intake/Output: I/O last 3 completed shifts: In: 600 [P.O.:600] Out: 1802 [Urine:1800; Stool:2]   Intake/Output this shift:  Total I/O In: 120 [P.O.:120] Out: -   CVS- RRR RS- CTA ABD- BS present soft non-distended EXT- no edema   Basic Metabolic Panel:  Recent Labs Lab 02/04/13 1730  02/07/13 0600 02/08/13 0510 02/09/13 1040 02/10/13 0500 02/11/13 0610  NA  --   < > 140 142 141 141 146*  K  --   < > 3.6 3.4* 3.7 3.2* 2.1*  CL  --   < > 104 105 104 104 117*  CO2  --   < > 26 27 26 25 20   GLUCOSE  --   < > 145* 82 162* 83 74  BUN  --   < > 29* 32* 35* 36* 29*  CREATININE  --   < > 3.23* 4.17* 4.90* 5.33* 4.31*  CALCIUM  --   < > 8.5 8.4 8.6 8.5 5.9*  MG 1.6  --   --  1.8  --   --   --   PHOS  --   --   --   --   --  2.1* 1.9*  < > = values in this interval not displayed.  Liver Function Tests:  Recent Labs Lab 02/10/13 0500 02/11/13 0610  ALBUMIN 2.1* 1.6*   No results found for this basename: LIPASE, AMYLASE,  in the last 168 hours No results found for this basename: AMMONIA,  in the last 168 hours  CBC:  Recent Labs Lab 02/07/13 0600 02/08/13 0510 02/09/13 1040 02/10/13 0500 02/11/13 0610  WBC 2.4* 6.4 20.7* 21.8* 16.6*  HGB 10.7* 9.2* 9.5* 8.8* 6.7*  HCT 31.0* 27.4* 28.1* 25.6* 19.1*  MCV 79.7 80.4 80.7 80.0 80.3  PLT 22*  11* 25* 38* 44*    Cardiac Enzymes:  Recent Labs Lab 02/04/13 1752 02/05/13 0014 02/05/13 0506  TROPONINI <0.30 <0.30 <0.30    BNP: No components found with this basename: POCBNP,   CBG:  Recent Labs Lab 02/10/13 0739 02/10/13 1140 02/10/13 1659 02/10/13 2126 02/11/13 0739  GLUCAP 90 151* 128* 111* 115*    Microbiology: Results for orders placed during the hospital encounter of 01/28/13  URINE CULTURE     Status: None   Collection Time    01/28/13 10:55 PM      Result Value Range Status   Specimen Description URINE, CLEAN CATCH   Final   Special Requests NONE   Final   Culture  Setup Time 01/29/2013 09:26   Final   Colony Count NO GROWTH   Final   Culture NO GROWTH   Final   Report Status 01/30/2013 FINAL   Final  URINE CULTURE     Status: None  Collection Time    01/31/13 10:50 PM      Result Value Range Status   Specimen Description URINE, RANDOM   Final   Special Requests NONE   Final   Culture  Setup Time 02/01/2013 03:34   Final   Colony Count NO GROWTH   Final   Culture NO GROWTH   Final   Report Status 02/02/2013 FINAL   Final  CULTURE, BLOOD (ROUTINE X 2)     Status: None   Collection Time    02/01/13 12:14 AM      Result Value Range Status   Specimen Description BLOOD RIGHT ARM   Final   Special Requests BOTTLES DRAWN AEROBIC AND ANAEROBIC    Final   Culture  Setup Time 02/01/2013 13:44   Final   Culture NO GROWTH 5 DAYS   Final   Report Status 02/07/2013 FINAL   Final  CULTURE, BLOOD (ROUTINE X 2)     Status: None   Collection Time    02/01/13 12:14 AM      Result Value Range Status   Specimen Description BLOOD  PORT   Final   Special Requests BOTTLES DRAWN AEROBIC AND ANAEROBIC    Final   Culture  Setup Time 02/01/2013 13:44   Final   Culture NO GROWTH 5 DAYS   Final   Report Status 02/07/2013 FINAL   Final  MRSA PCR SCREENING     Status: None   Collection Time    02/04/13 12:36 PM      Result Value Range Status   MRSA by  PCR NEGATIVE  NEGATIVE Final   Comment:            The GeneXpert MRSA Assay (FDA     approved for NASAL specimens     only), is one component of a     comprehensive MRSA colonization     surveillance program. It is not     intended to diagnose MRSA     infection nor to guide or     monitor treatment for     MRSA infections.    Coagulation Studies: No results found for this basename: LABPROT, INR,  in the last 72 hours  Urinalysis: No results found for this basename: COLORURINE, APPERANCEUR, LABSPEC, PHURINE, GLUCOSEU, HGBUR, BILIRUBINUR, KETONESUR, PROTEINUR, UROBILINOGEN, NITRITE, LEUKOCYTESUR,  in the last 72 hours    Imaging: No results found.   Medications:     . acetaminophen  650 mg Oral Once  . antiseptic oral rinse  15 mL Mouth Rinse QID  . diltiazem  60 mg Oral Q6H  . docusate sodium  100 mg Oral BID  . furosemide  60 mg Intravenous Once  . insulin aspart  0-15 Units Subcutaneous TID WC  . insulin aspart  0-5 Units Subcutaneous QHS  . lactose free nutrition  237 mL Oral TID WC  . metoprolol tartrate  50 mg Oral BID  . pantoprazole  40 mg Oral Daily  . polyethylene glycol  17 g Oral Daily  . potassium chloride  10 mEq Intravenous Q1 Hr x 4   sodium chloride, sodium chloride, acetaminophen, calcium carbonate, feeding supplement (NEPRO CARB STEADY), heparin, lidocaine (PF), lidocaine-prilocaine, LORazepam, metoCLOPramide (REGLAN) injection, ondansetron, pentafluoroprop-tetrafluoroeth, sodium chloride, traMADol, zolpidem  Assessment/ Plan:  1. Acute on chronic renal failure- suspect due to chemoRx (ifosfamide/carboplatin) in setting of CKD III / ACEI. No other cause noted. Nonoliguric, s/p HD x 3 with improved sensorium. Creatinine now improving 2. Platelets  are low which may complicate things. No further dialysis since weekendCKD III, baseline eGFR 50-60 ml/min 3. HTN- on metoprolol only. Appears stable consider add Amlodipine 5 mg 4. Recurrent NHL- s/p first round  chemo 3/28-3/30 5. Pancytopenia- due to chemoRx, WBC improving, plts still low 11k  Patient receiving diltiazem. Will follow HTN for now  Recovering acute renal failure  Replete potassium IV x 4    LOS: 14 Trinita Devlin W @TODAY @9 :45 AM

## 2013-02-11 NOTE — Progress Notes (Signed)
PT Cancellation Note  Patient Details Name: [name] MRN: [id number] DOB: 18-May-1941   Cancelled Treatment:    Reason Eval/Treat Not Completed: Patient not medically ready (RN reports pt with decreased Hgb today)   Donnetta Hail 02/11/2013, 4:08 PM

## 2013-02-11 NOTE — Progress Notes (Signed)
CRITICAL VALUE ALERT  Critical value received:  Potassium 2.1 and Calcium 5.9  Date of notification:  02/11/2013  Time of notification:  0728  Critical value read back:yes  Nurse who received alert:  Lovie Macadamia RN)  MD notified (1st page):  Dr Sunnie Nielsen  Time of first page:  0745  MD notified (2nd page):Dr Hyman Hopes (nephrology) (call to let him know about patient potassium and calcium)  Time of second page:0810  Responding MD:  Dr Sunnie Nielsen  Time MD responded:  0800

## 2013-02-12 LAB — GLUCOSE, CAPILLARY
Glucose-Capillary: 164 mg/dL — ABNORMAL HIGH (ref 70–99)
Glucose-Capillary: 140 mg/dL — ABNORMAL HIGH (ref 70–99)
Glucose-Capillary: 153 mg/dL — ABNORMAL HIGH (ref 70–99)

## 2013-02-12 LAB — HEPARIN INDUCED THROMBOCYTOPENIA PNL
Heparin Induced Plt Ab: NEGATIVE
Patient O.D.: 0.018
UFH Low Dose 0.1 IU/mL: 0 % Release
UFH Low Dose 0.5 IU/mL: 0 % Release

## 2013-02-12 LAB — CBC
HCT: 32.4 % — ABNORMAL LOW (ref 36.0–46.0)
Hemoglobin: 11.7 g/dL — ABNORMAL LOW (ref 12.0–15.0)
MCV: 77.9 fL — ABNORMAL LOW (ref 78.0–100.0)
WBC: 22 10*3/uL — ABNORMAL HIGH (ref 4.0–10.5)

## 2013-02-12 LAB — TYPE AND SCREEN
Antibody Screen: NEGATIVE
Unit division: 0

## 2013-02-12 LAB — RENAL FUNCTION PANEL
CO2: 26 mEq/L (ref 19–32)
Calcium: 8.3 mg/dL — ABNORMAL LOW (ref 8.4–10.5)
Creatinine, Ser: 5.83 mg/dL — ABNORMAL HIGH (ref 0.50–1.10)
GFR calc Af Amer: 8 mL/min — ABNORMAL LOW (ref >90)
Glucose, Bld: 102 mg/dL — ABNORMAL HIGH (ref 70–99)
Sodium: 140 mEq/L (ref 135–145)

## 2013-02-12 MED ORDER — HYDRALAZINE HCL 20 MG/ML IJ SOLN: 10.0000 mg | Freq: Three times a day (TID) | INTRAMUSCULAR | Status: DC | PRN

## 2013-02-12 MED ORDER — POTASSIUM CHLORIDE 10 MEQ/50ML IV SOLN
10.0000 meq | INTRAVENOUS | Status: AC
  Administered 2013-02-12 (×3): 10 meq via INTRAVENOUS
  Filled 2013-02-12 (×3): qty 50

## 2013-02-12 MED ORDER — SODIUM CHLORIDE 0.9 % IV SOLN
INTRAVENOUS | Status: DC
  Administered 2013-02-12 (×2): via INTRAVENOUS

## 2013-02-12 MED ORDER — SODIUM CHLORIDE 0.9 % IV BOLUS (SEPSIS)
500.0000 mL | Freq: Once | INTRAVENOUS | Status: AC
  Administered 2013-02-12: 500 mL via INTRAVENOUS

## 2013-02-12 NOTE — Progress Notes (Signed)
Renal function looks worse today.  Cr is 5.83!!!  K+ is 2.9.  I ahave ordered K+ runs.  Had 2 units of blood yesterday.  No n/v.  I thought she could go home today, but the renal function came back much higher than I thought.  I do no se nay evidence of uremia.    She is OOB.   Having some pain in the left lateral rib cage.  Ultram does seem to be helping.  Her vital signs still show high blood pressure.  Her heart rate seems to be NSR. She is off all anti-coagulation.  Her exam is non-focal.  I reallly want her to go home, but I worry about her renal function.  Maybe she can go home tomorrow if renal function has stabilized??  Platelets are coming up nicely.  Hgb is 11.5.   I am praying hard!!!  Cindee Lame E.  Psalm 34:1-3

## 2013-02-12 NOTE — Care Management Note (Signed)
   CARE MANAGEMENT NOTE 02/12/2013  Patient:  [name], [name]   Account Number:  [id number]  Date Initiated:  01/29/2013  Documentation initiated by:  Ezekiel Ina  Subjective/Objective Assessment:   pt admitted with cco N, V after Chemo recent discharged 01/27/13     Action/Plan:   from home  02/12/2013 Met with pt re possible HH needs at time of d/c. Pt selected [facility] if HHPT or HHRN are needed at time of d/c.   Anticipated DC Date:  02/16/2013   Anticipated DC Plan:  HOME W HOME HEALTH SERVICES      DC Planning Services  CM consult      Choice offered to / List presented to:             Status of service:  In process, will continue to follow Medicare Important Message given?  NO (If response is "NO", the following Medicare IM given date fields will be blank) Date Medicare IM given:   Date Additional Medicare IM given:    Discharge Disposition:    Per UR Regulation:  Reviewed for med. necessity/level of care/duration of stay  If discussed at Long Length of Stay Meetings, dates discussed:    Comments:  01/29/13 MMcGibboney, RN, BSN Chart reviewed.  02/12/2013 Met with pt re possible d/c needs. Plan to d/c to home where husband is available to assist 24/7. If HH is ordered for HHPT or HHRN, pt has selected [facility]. CM will continue to follow for any needs. Johny Shock RN MPH Case manager [phone]

## 2013-02-12 NOTE — Progress Notes (Signed)
[facility] PROGRESS NOTE  [name] ZOX:[id number] DOB: 28-Jul-1941 DOA: 01/28/2013 PCP: [name], MD  Assessment/Plan:  ARF (acute renal failure) on CKD (chronic kidney disease) stage 2, GFR 60-89 ml/min/ATN (acute tubular necrosis)  -started HD this admit via femoral access - fem cath out on 02/08/13  - dialyzed 4/11 then held and monitor response.  -? ATN from chemorxn  -Hypokalemia: replaced with IV 3 runs.  -Cr increase today. Started on IV fluids.  -Disposition per renal.   Hypertension: BP increasing. Will add PRN hydralazine.  Continue with metoprolol and Cardizem.  Anemia: Hb decrease to 6. Patient to received 2 units PRBC 4-16. Hb increase to 11.   Febrile neutropenia  Received full antibiotic treatment With cefepime and primaxin for 1 week - cultures negative. stoped abx 4/12  Pancytopenia from chemo  Received neulasta and neutropenia has resolved by 02/08/13  Thrombocytopenia started to improve  Nadir 11 k on 4/13  Atrial fibrillation with RVR  -was on Pradaxa pre admit but was dc'd due to thrombocytopenia. -will need to follow up with Dr Myna Hidalgo outpatient prior to considering resume anticoagulation. Per Dr Myna Hidalgo note, he would continue to hold anticoagulation.  -RVR during 1st HD tx-resolved  -required CCB gtt then converted to NSR so have transitioned to po BB and CCB  -patient with paroxysmal A fib.  Large cell (diffuse) non-Hodgkin's lymphoma/ Antineoplastic chemotherapy induced pancytopenia  - s/p R-ICE 01/23/13  -per oncology  -transfused platelets and received Neupogen (4/10)  -WBC increasing, at 22. Type II or unspecified type diabetes mellitus with ketoacidosis, controlled  -was on Amaryl pre admit  History of CAD/HTN  -Pradaxa on hold  -intolerant to statins  -ACE I on hold due to ARF requiring HD- will NOT resume  Nausea and vomiting/Anorexia  -due to recent chemo tx and underlying uremia  - resolved  Abdominal pain, other  specified site  -resolved  Constipation due to pain medication  - resolved     DVT prophylaxis: SCDs-unable to proceed with pharmacological prophylaxis despite underlying malignancy secondary to thrombocytopenia  Code Status: Full  Family Communication: patient and son who was at bedside.  Disposition Plan: home   Consultants: Nephrology  Oncology   Procedures: Right femoral dialysis catheter placement with ultrasound guidance (4/9)- removed 4/13    Antibiotics: Primaxin 4/8 >>>4/12  Diflucan 4/5 >>>4/12  Maxipime 4/5 >>> 4/8   HPI/Subjective: Feeling well. No abdominal pain today.   Objective: Filed Vitals:   02/12/13 0000 02/12/13 0030 02/12/13 0503 02/12/13 0929  BP: 175/75 157/73 187/83 177/74  Pulse: 63 65 72 72  Temp: 98.3 F (36.8 C) 98.4 F (36.9 C) 97.7 F (36.5 C) 97 F (36.1 C)  TempSrc: Oral Oral Oral   Resp: 18 18 18 18   Height:      Weight:      SpO2:   93% 97%    Intake/Output Summary (Last 24 hours) at 02/12/13 1639 Last data filed at 02/12/13 1500  Gross per 24 hour  Intake  592.5 ml  Output   1150 ml  Net -557.5 ml   Filed Weights   02/09/13 2033 02/10/13 2100 02/11/13 2125  Weight: 76.6 kg (168 lb 14 oz) 76.5 kg (168 lb 10.4 oz) 75.206 kg (165 lb 12.8 oz)    Exam:   General:  No distress.  Cardiovascular: S 1, S 2 RRR  Respiratory: CTA  Abdomen: bs present, soft, NT  Musculoskeletal: no edema.   Data Reviewed: Basic Metabolic Panel:  Recent  Labs Lab 02/08/13 0510 02/09/13 1040 02/10/13 0500 02/11/13 0610 02/11/13 1530 02/12/13 0540  NA 142 141 141 146*  --  140  K 3.4* 3.7 3.2* 2.1* 3.7 2.9*  CL 105 104 104 117*  --  101  CO2 27 26 25 20   --  26  GLUCOSE 82 162* 83 74  --  102*  BUN 32* 35* 36* 29*  --  38*  CREATININE 4.17* 4.90* 5.33* 4.31*  --  5.83*  CALCIUM 8.4 8.6 8.5 5.9*  --  8.3*  MG 1.8  --   --   --   --   --   PHOS  --   --  2.1* 1.9*  --  3.2   Liver Function Tests:  Recent Labs Lab  02/10/13 0500 02/11/13 0610 02/12/13 0540  ALBUMIN 2.1* 1.6* 2.4*   No results found for this basename: LIPASE, AMYLASE,  in the last 168 hours No results found for this basename: AMMONIA,  in the last 168 hours CBC:  Recent Labs Lab 02/08/13 0510 02/09/13 1040 02/10/13 0500 02/11/13 0610 02/12/13 0540  WBC 6.4 20.7* 21.8* 16.6* 22.0*  HGB 9.2* 9.5* 8.8* 6.7* 11.7*  HCT 27.4* 28.1* 25.6* 19.1* 32.4*  MCV 80.4 80.7 80.0 80.3 77.9*  PLT 11* 25* 38* 44* 65*   Cardiac Enzymes: No results found for this basename: CKTOTAL, CKMB, CKMBINDEX, TROPONINI,  in the last 168 hours BNP (last 3 results) No results found for this basename: PROBNP,  in the last 8760 hours CBG:  Recent Labs Lab 02/11/13 1135 02/11/13 1621 02/11/13 2143 02/12/13 0724 02/12/13 1124  GLUCAP 169* 137* 135* 106* 164*    Recent Results (from the past 240 hour(s))  MRSA PCR SCREENING     Status: None   Collection Time    02/04/13 12:36 PM      Result Value Range Status   MRSA by PCR NEGATIVE  NEGATIVE Final   Comment:            The GeneXpert MRSA Assay (FDA     approved for NASAL specimens     only), is one component of a     comprehensive MRSA colonization     surveillance program. It is not     intended to diagnose MRSA     infection nor to guide or     monitor treatment for     MRSA infections.     Studies: No results found.  Scheduled Meds: . antiseptic oral rinse  15 mL Mouth Rinse QID  . diltiazem  60 mg Oral Q6H  . docusate sodium  100 mg Oral BID  . insulin aspart  0-15 Units Subcutaneous TID WC  . insulin aspart  0-5 Units Subcutaneous QHS  . lactose free nutrition  237 mL Oral TID WC  . metoprolol tartrate  50 mg Oral BID  . pantoprazole  40 mg Oral Daily   Continuous Infusions: . sodium chloride 75 mL/hr at 02/12/13 1140    Active Problems:   Large cell (diffuse) non-Hodgkin's lymphoma   Nausea and vomiting   Abdominal  pain, other specified site   Type II or  unspecified type diabetes mellitus with ketoacidosis, not stated as uncontrolled   ARF (acute renal failure)   Antineoplastic chemotherapy induced pancytopenia   CKD (chronic kidney disease) stage 2, GFR 60-89 ml/min   New onset atrial fibrillation with RVR   Anorexia   Constipation due to pain medication   ATN (acute tubular necrosis)  Time spent: 35 minutes.     Fanchon Papania  [facility] Pager [phone]. If 7PM-7AM, please contact night-coverage at [email or web address], password [facility] 02/12/2013, 4:39 PM  LOS: 15 days

## 2013-02-12 NOTE — Progress Notes (Signed)
Physical Therapy Treatment Patient Details Name: [name] MRN: [id number] DOB: Jan 15, 1941 Today's Date: 02/12/2013 Time: 4098-1191 PT Time Calculation (min): 27 min  PT Assessment / Plan / Recommendation Comments on Treatment Session  Continues steay improvement.  Will benefit from HHPT at discharge    Follow Up Recommendations  Home health PT     Does the patient have the potential to tolerate intense rehabilitation     Barriers to Discharge        Equipment Recommendations  None recommended by PT    Recommendations for Other Services    Frequency Min 3X/week   Plan Discharge plan remains appropriate;Frequency remains appropriate    Precautions / Restrictions Precautions Precautions: Fall Restrictions Weight Bearing Restrictions: No   Pertinent Vitals/Pain No c/o pain    Mobility  Bed Mobility Bed Mobility: Rolling Left;Rolling Right;Right Sidelying to Sit;Supine to Sit Rolling Right: 7: Independent Rolling Left: 7: Independent Right Sidelying to Sit: 7: Independent Supine to Sit: 7: Independent Transfers Transfers: Sit to Stand;Stand to Sit Sit to Stand: 5: Supervision;With upper extremity assist Stand to Sit: 5: Supervision;Without upper extremity assist Details for Transfer Assistance: pt better able to control stand to sit today Ambulation/Gait Ambulation/Gait Assistance: 4: Min assist Ambulation Distance (Feet): 125 Feet (50, 75) Assistive device: Rolling walker Ambulation/Gait Assistance Details: more assist at end of walk as pt fatigues Gait Pattern: Within Functional Limits Gait velocity: decreased General Gait Details: consistent cues needed to extend hips and trunk Pt with obvious fatigue at end of walks Stairs: No Wheelchair Mobility Wheelchair Mobility: No    Exercises General Exercises - Lower Extremity Ankle Circles/Pumps: AROM;10 reps;Seated Gluteal Sets: AROM;Both;5 reps;Seated Long Arc Quad: AROM;10 reps;Seated Hip  Flexion/Marching: AROM;Seated;10 reps (encourged abdominal set prior to exercise) Other Exercises Other Exercises: pt encouraged to do short bouts of activity frequently: deep breathing, abd sets, glute sets Other Exercises:  issued and reviewed written home program of exercises. Pt able to demonstrate   PT Diagnosis:    PT Problem List:   PT Treatment Interventions:     PT Goals Acute Rehab PT Goals PT Goal Formulation: With patient Time For Goal Achievement: 02/21/13 Potential to Achieve Goals: Good Pt will go Sit to Stand: with modified independence PT Goal: Sit to Stand - Progress: Met Pt will go Stand to Sit: with modified independence PT Goal: Stand to Sit - Progress: Met Pt will Ambulate: 51 - 150 feet;with modified independence;with least restrictive assistive device PT Goal: Ambulate - Progress: Progressing toward goal Pt will Go Up / Down Stairs: 1-2 stairs;with modified independence;with least restrictive assistive device Pt will Perform Home Exercise Program: Independently PT Goal: Perform Home Exercise Program - Progress: Progressing toward goal  Visit Information  Last PT Received On: 02/12/13 Assistance Needed: +1    Subjective Data  Subjective: My upper body is strong....its my legs! Patient Stated Goal: to go home tomorrow   Cognition  Cognition Arousal/Alertness: Awake/alert    Balance     End of Session PT - End of Session Equipment Utilized During Treatment: Gait belt Activity Tolerance: Patient tolerated treatment well Patient left: in chair;with call bell/phone within reach   GP    Rosey Bath K. Manson Passey, Rudy [phone] 02/12/2013, 10:13 AM

## 2013-02-12 NOTE — Progress Notes (Signed)
[facility] ROUNDING NOTE   Subjective:   Interval History: alert no complaints  Objective:  Vital signs in last 24 hours:  Temp:  [97 F (36.1 C)-98.6 F (37 C)] 97 F (36.1 C) (04/17 0929) Pulse Rate:  [63-79] 72 (04/17 0929) Resp:  [17-20] 18 (04/17 0929) BP: (157-202)/(62-83) 177/74 mmHg (04/17 0929) SpO2:  [91 %-97 %] 97 % (04/17 0929) Weight:  [75.206 kg (165 lb 12.8 oz)] 75.206 kg (165 lb 12.8 oz) (04/16 2125)  Weight change: -1.294 kg (-2 lb 13.6 oz) Filed Weights   02/09/13 2033 02/10/13 2100 02/11/13 2125  Weight: 76.6 kg (168 lb 14 oz) 76.5 kg (168 lb 10.4 oz) 75.206 kg (165 lb 12.8 oz)    Intake/Output: I/O last 3 completed shifts: In: 432.5 [P.O.:120; Blood:312.5] Out: 1450 [Urine:1450]   Intake/Output this shift:  Total I/O In: 30 [P.O.:30] Out: 300 [Urine:300]  CVS- RRR RS- CTA ABD- BS present soft non-distended EXT- no edema   Basic Metabolic Panel:  Recent Labs Lab 02/08/13 0510 02/09/13 1040 02/10/13 0500 02/11/13 0610 02/11/13 1530 02/12/13 0540  NA 142 141 141 146*  --  140  K 3.4* 3.7 3.2* 2.1* 3.7 2.9*  CL 105 104 104 117*  --  101  CO2 27 26 25 20   --  26  GLUCOSE 82 162* 83 74  --  102*  BUN 32* 35* 36* 29*  --  38*  CREATININE 4.17* 4.90* 5.33* 4.31*  --  5.83*  CALCIUM 8.4 8.6 8.5 5.9*  --  8.3*  MG 1.8  --   --   --   --   --   PHOS  --   --  2.1* 1.9*  --  3.2    Liver Function Tests:  Recent Labs Lab 02/10/13 0500 02/11/13 0610 02/12/13 0540  ALBUMIN 2.1* 1.6* 2.4*   No results found for this basename: LIPASE, AMYLASE,  in the last 168 hours No results found for this basename: AMMONIA,  in the last 168 hours  CBC:  Recent Labs Lab 02/08/13 0510 02/09/13 1040 02/10/13 0500 02/11/13 0610 02/12/13 0540  WBC 6.4 20.7* 21.8* 16.6* 22.0*  HGB 9.2* 9.5* 8.8* 6.7* 11.7*  HCT 27.4* 28.1* 25.6* 19.1* 32.4*  MCV 80.4 80.7 80.0 80.3 77.9*  PLT 11* 25* 38* 44* 65*    Cardiac Enzymes: No results  found for this basename: CKTOTAL, CKMB, CKMBINDEX, TROPONINI,  in the last 168 hours  BNP: No components found with this basename: POCBNP,   CBG:  Recent Labs Lab 02/11/13 0739 02/11/13 1135 02/11/13 1621 02/11/13 2143 02/12/13 0724  GLUCAP 115* 169* 137* 135* 106*    Microbiology: Results for orders placed during the hospital encounter of 01/28/13  URINE CULTURE     Status: None   Collection Time    01/28/13 10:55 PM      Result Value Range Status   Specimen Description URINE, CLEAN CATCH   Final   Special Requests NONE   Final   Culture  Setup Time 01/29/2013 09:26   Final   Colony Count NO GROWTH   Final   Culture NO GROWTH   Final   Report Status 01/30/2013 FINAL   Final  URINE CULTURE     Status: None   Collection Time    01/31/13 10:50 PM      Result Value Range Status   Specimen Description URINE, RANDOM   Final   Special Requests NONE   Final   Culture  Setup  Time 02/01/2013 03:34   Final   Colony Count NO GROWTH   Final   Culture NO GROWTH   Final   Report Status 02/02/2013 FINAL   Final  CULTURE, BLOOD (ROUTINE X 2)     Status: None   Collection Time    02/01/13 12:14 AM      Result Value Range Status   Specimen Description BLOOD RIGHT ARM   Final   Special Requests BOTTLES DRAWN AEROBIC AND ANAEROBIC    Final   Culture  Setup Time 02/01/2013 13:44   Final   Culture NO GROWTH 5 DAYS   Final   Report Status 02/07/2013 FINAL   Final  CULTURE, BLOOD (ROUTINE X 2)     Status: None   Collection Time    02/01/13 12:14 AM      Result Value Range Status   Specimen Description BLOOD  PORT   Final   Special Requests BOTTLES DRAWN AEROBIC AND ANAEROBIC    Final   Culture  Setup Time 02/01/2013 13:44   Final   Culture NO GROWTH 5 DAYS   Final   Report Status 02/07/2013 FINAL   Final  MRSA PCR SCREENING     Status: None   Collection Time    02/04/13 12:36 PM      Result Value Range Status   MRSA by PCR NEGATIVE  NEGATIVE Final   Comment:             The GeneXpert MRSA Assay (FDA     approved for NASAL specimens     only), is one component of a     comprehensive MRSA colonization     surveillance program. It is not     intended to diagnose MRSA     infection nor to guide or     monitor treatment for     MRSA infections.    Coagulation Studies: No results found for this basename: LABPROT, INR,  in the last 72 hours  Urinalysis: No results found for this basename: COLORURINE, APPERANCEUR, LABSPEC, PHURINE, GLUCOSEU, HGBUR, BILIRUBINUR, KETONESUR, PROTEINUR, UROBILINOGEN, NITRITE, LEUKOCYTESUR,  in the last 72 hours    Imaging: No results found.   Medications:     . antiseptic oral rinse  15 mL Mouth Rinse QID  . diltiazem  60 mg Oral Q6H  . docusate sodium  100 mg Oral BID  . insulin aspart  0-15 Units Subcutaneous TID WC  . insulin aspart  0-5 Units Subcutaneous QHS  . lactose free nutrition  237 mL Oral TID WC  . metoprolol tartrate  50 mg Oral BID  . pantoprazole  40 mg Oral Daily  . polyethylene glycol  17 g Oral Daily  . potassium chloride  10 mEq Intravenous Q1 Hr x 3   sodium chloride, sodium chloride, acetaminophen, calcium carbonate, feeding supplement (NEPRO CARB STEADY), fentaNYL, heparin, lidocaine (PF), lidocaine-prilocaine, LORazepam, metoCLOPramide (REGLAN) injection, ondansetron, pentafluoroprop-tetrafluoroeth, sodium chloride, traMADol, zolpidem  Assessment/ Plan:  1. Acute on chronic renal failure- suspect due to chemoRx (ifosfamide/carboplatin) in setting of CKD III / ACEI. No other cause noted. Nonoliguric, s/p HD x 3, 4/9 > 4/11. Creatinine was coming down, up today due to IV lasix given yesterday. Does not need lasix. Give IVF's overnight, home when creatinine stable or declining without dialysis. 2. CKD III, baseline eGFR 50-60 ml/min 3. 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 Pancytopenia- improved  Vinson Moselle  MD [phone] pgr  [phone]  cell 02/12/2013, 10:04 AM

## 2013-02-13 ENCOUNTER — Other Ambulatory Visit: Payer: Self-pay | Admitting: Hematology & Oncology

## 2013-02-13 DIAGNOSIS — C8330 Diffuse large B-cell lymphoma, unspecified site: Secondary | ICD-10-CM

## 2013-02-13 LAB — RENAL FUNCTION PANEL
Albumin: 2.3 g/dL — ABNORMAL LOW (ref 3.5–5.2)
BUN: 36 mg/dL — ABNORMAL HIGH (ref 6–23)
CO2: 25 mEq/L (ref 19–32)
Chloride: 102 mEq/L (ref 96–112)
Glucose, Bld: 111 mg/dL — ABNORMAL HIGH (ref 70–99)
Potassium: 3.1 mEq/L — ABNORMAL LOW (ref 3.5–5.1)

## 2013-02-13 LAB — CBC
HCT: 33.2 % — ABNORMAL LOW (ref 36.0–46.0)
Hemoglobin: 11.6 g/dL — ABNORMAL LOW (ref 12.0–15.0)
RBC: 4.22 MIL/uL (ref 3.87–5.11)
WBC: 20.7 10*3/uL — ABNORMAL HIGH (ref 4.0–10.5)

## 2013-02-13 MED ORDER — DILTIAZEM HCL 60 MG PO TABS: 60.0000 mg | ORAL_TABLET | Freq: Four times a day (QID) | ORAL | Status: DC

## 2013-02-13 MED ORDER — POTASSIUM CHLORIDE 10 MEQ/50ML IV SOLN
10.0000 meq | INTRAVENOUS | Status: AC
  Administered 2013-02-13 (×4): 10 meq via INTRAVENOUS
  Filled 2013-02-13 (×4): qty 50

## 2013-02-13 MED ORDER — HEPARIN SOD (PORK) LOCK FLUSH 100 UNIT/ML IV SOLN
500.0000 [IU] | INTRAVENOUS | Status: AC | PRN
  Administered 2013-02-13: 500 [IU]

## 2013-02-13 MED ORDER — BOOST PLUS PO LIQD: 237.0000 mL | Freq: Three times a day (TID) | ORAL | Status: DC

## 2013-02-13 MED ORDER — POTASSIUM CHLORIDE ER 10 MEQ PO TBCR: 20.0000 meq | EXTENDED_RELEASE_TABLET | Freq: Every day | ORAL | Status: DC

## 2013-02-13 NOTE — Progress Notes (Signed)
Discussed discharge instructions and medications with pt. Pt showed no barriers to discharge. Porta-cath de-accessed by IV team. Tele removed. Pt discharged to home with husband. Assessment unchanged from morning.

## 2013-02-13 NOTE — Progress Notes (Signed)
Pt frequently urinating throughout the night. Total urine output since midnight is 850 ml. Pt has NS running at 38ml/hr. Will continue to monitor.

## 2013-02-13 NOTE — Progress Notes (Signed)
[facility] ROUNDING NOTE   Subjective:   Interval History: alert no complaints  Objective:  Vital signs in last 24 hours:  Temp:  [98.1 F (36.7 C)-98.6 F (37 C)] 98.4 F (36.9 C) (04/18 0913) Pulse Rate:  [67-81] 70 (04/18 0913) Resp:  [18] 18 (04/18 0913) BP: (176-195)/(75-88) 179/84 mmHg (04/18 0913) SpO2:  [94 %-96 %] 96 % (04/18 0913) Weight:  [75.07 kg (165 lb 8 oz)] 75.07 kg (165 lb 8 oz) (04/17 2125)  Weight change: -0.136 kg (-4.8 oz) Filed Weights   02/10/13 2100 02/11/13 2125 02/12/13 2125  Weight: 76.5 kg (168 lb 10.4 oz) 75.206 kg (165 lb 12.8 oz) 75.07 kg (165 lb 8 oz)    Intake/Output: I/O last 3 completed shifts: In: 1762.5 [P.O.:150; I.V.:1300; Blood:312.5] Out: 2050 [Urine:2050]   Intake/Output this shift:  Total I/O In: 120 [P.O.:120] Out: -   CVS- RRR RS- CTA ABD- BS present soft non-distended EXT- no edema   Basic Metabolic Panel:  Recent Labs Lab 02/08/13 0510 02/09/13 1040 02/10/13 0500 02/11/13 0610 02/11/13 1530 02/12/13 0540 02/13/13 0415  NA 142 141 141 146*  --  140 139  K 3.4* 3.7 3.2* 2.1* 3.7 2.9* 3.1*  CL 105 104 104 117*  --  101 102  CO2 27 26 25 20   --  26 25  GLUCOSE 82 162* 83 74  --  102* 111*  BUN 32* 35* 36* 29*  --  38* 36*  CREATININE 4.17* 4.90* 5.33* 4.31*  --  5.83* 5.52*  CALCIUM 8.4 8.6 8.5 5.9*  --  8.3* 8.1*  MG 1.8  --   --   --   --   --   --   PHOS  --   --  2.1* 1.9*  --  3.2 3.3    Liver Function Tests:  Recent Labs Lab 02/10/13 0500 02/11/13 0610 02/12/13 0540 02/13/13 0415  ALBUMIN 2.1* 1.6* 2.4* 2.3*   No results found for this basename: LIPASE, AMYLASE,  in the last 168 hours No results found for this basename: AMMONIA,  in the last 168 hours  CBC:  Recent Labs Lab 02/09/13 1040 02/10/13 0500 02/11/13 0610 02/12/13 0540 02/13/13 0415  WBC 20.7* 21.8* 16.6* 22.0* 20.7*  HGB 9.5* 8.8* 6.7* 11.7* 11.6*  HCT 28.1* 25.6* 19.1* 32.4* 33.2*  MCV 80.7 80.0 80.3  77.9* 78.7  PLT 25* 38* 44* 65* 81*    Cardiac Enzymes: No results found for this basename: CKTOTAL, CKMB, CKMBINDEX, TROPONINI,  in the last 168 hours  BNP: No components found with this basename: POCBNP,   CBG:  Recent Labs Lab 02/12/13 0724 02/12/13 1124 02/12/13 1649 02/12/13 2119 02/13/13 0729  GLUCAP 106* 164* 140* 153* 115*    Microbiology: Results for orders placed during the hospital encounter of 01/28/13  URINE CULTURE     Status: None   Collection Time    01/28/13 10:55 PM      Result Value Range Status   Specimen Description URINE, CLEAN CATCH   Final   Special Requests NONE   Final   Culture  Setup Time 01/29/2013 09:26   Final   Colony Count NO GROWTH   Final   Culture NO GROWTH   Final   Report Status 01/30/2013 FINAL   Final  URINE CULTURE     Status: None   Collection Time    01/31/13 10:50 PM      Result Value Range Status   Specimen Description URINE, RANDOM  Final   Special Requests NONE   Final   Culture  Setup Time 02/01/2013 03:34   Final   Colony Count NO GROWTH   Final   Culture NO GROWTH   Final   Report Status 02/02/2013 FINAL   Final  CULTURE, BLOOD (ROUTINE X 2)     Status: None   Collection Time    02/01/13 12:14 AM      Result Value Range Status   Specimen Description BLOOD RIGHT ARM   Final   Special Requests BOTTLES DRAWN AEROBIC AND ANAEROBIC    Final   Culture  Setup Time 02/01/2013 13:44   Final   Culture NO GROWTH 5 DAYS   Final   Report Status 02/07/2013 FINAL   Final  CULTURE, BLOOD (ROUTINE X 2)     Status: None   Collection Time    02/01/13 12:14 AM      Result Value Range Status   Specimen Description BLOOD  PORT   Final   Special Requests BOTTLES DRAWN AEROBIC AND ANAEROBIC    Final   Culture  Setup Time 02/01/2013 13:44   Final   Culture NO GROWTH 5 DAYS   Final   Report Status 02/07/2013 FINAL   Final  MRSA PCR SCREENING     Status: None   Collection Time    02/04/13 12:36 PM      Result Value  Range Status   MRSA by PCR NEGATIVE  NEGATIVE Final   Comment:            The GeneXpert MRSA Assay (FDA     approved for NASAL specimens     only), is one component of a     comprehensive MRSA colonization     surveillance program. It is not     intended to diagnose MRSA     infection nor to guide or     monitor treatment for     MRSA infections.    Coagulation Studies: No results found for this basename: LABPROT, INR,  in the last 72 hours  Urinalysis: No results found for this basename: COLORURINE, APPERANCEUR, LABSPEC, PHURINE, GLUCOSEU, HGBUR, BILIRUBINUR, KETONESUR, PROTEINUR, UROBILINOGEN, NITRITE, LEUKOCYTESUR,  in the last 72 hours    Imaging: No results found.   Medications:   . sodium chloride 75 mL/hr at 02/12/13 2152   . antiseptic oral rinse  15 mL Mouth Rinse QID  . diltiazem  60 mg Oral Q6H  . docusate sodium  100 mg Oral BID  . insulin aspart  0-15 Units Subcutaneous TID WC  . insulin aspart  0-5 Units Subcutaneous QHS  . lactose free nutrition  237 mL Oral TID WC  . metoprolol tartrate  50 mg Oral BID  . pantoprazole  40 mg Oral Daily  . potassium chloride  10 mEq Intravenous Q1 Hr x 4   sodium chloride, sodium chloride, acetaminophen, calcium carbonate, feeding supplement (NEPRO CARB STEADY), fentaNYL, heparin, hydrALAZINE, lidocaine (PF), lidocaine-prilocaine, LORazepam, metoCLOPramide (REGLAN) injection, ondansetron, pentafluoroprop-tetrafluoroeth, sodium chloride, traMADol, zolpidem  Assessment/ Plan:  1. Acute on chronic renal failure- suspect due to chemoRx (ifosfamide/carboplatin) in setting of CKD III / ACEI. No other cause noted. Nonoliguric, s/p HD x 3, 4/9 > 4/11. Creatinine down today. OK for discharge with close f/u per PCP. Patient will call [facility] Monday for f/u appt with Dr Lowell Guitar. In meantime pt should have weekly labs with PCP until seen by nephrology. Pt understands and said she will arrange with PCP. No  diuretics.  2. CKD III, baseline eGFR  50-60 ml/min 3. 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 Pancytopenia- improved  Vinson Moselle  MD [phone] pgr    [phone] cell 02/13/2013, 11:21 AM

## 2013-02-13 NOTE — Progress Notes (Signed)
[name]'s renal function is better!!!! I believe that she can go home today. Her potassium is on the low side. I will give her some potassium runs. She is urinating well. She's not having as much pain over the left side. She is out of bed. Her appetite has been doing okay.  She had an excellent urine output yesterday.   Her potassium is 3.1. Her creatinine is 5.53. Her platelet count continues to come up. It is 81,000. Hemoglobin is 11.6. White cell  count is 20.7. I believe the elevated white cell count is due to her having Neupogen and Neulasta.  Her blood pressure still the high side. I do appreciate the hospitalist in nephrology helping to manage this.  Her lungs are clear. Cardiac exam regular rate and rhythm. I do not hear atrial fibrillation. Abdomen is soft. She has good bowel sounds. There is no fluid wave. Extremities shows no clubbing cyanosis or edema. Neurological exam shows no focal neurological deficits. Skin exam is slightly dry.  Again, I think she can go home now. Her renal function has stabilized. This can be managed as an outpatient.  I will see her back in about 10 days. I will do a PET scan as an outpatient and see how she's responded to her one and only cycle of chemotherapy.  She is looking forward to going home and spending Easter with her family.  Pete E.  1 Cor 10:13

## 2013-02-13 NOTE — Discharge Summary (Signed)
Physician Discharge Summary  [name] ZOX:[id number] DOB: [birth year] DOA: 01/28/2013  PCP: [name], MD  Admit date: 01/28/2013 Discharge date: 02/13/2013  Time spent: 35 minutes  Recommendations for Outpatient Follow-up:  1. Need B-met on Monday to follow renal function and potasium level. 2. Needs to follow up with Dr Myna Hidalgo and Dr Lowell Guitar for lymphoma and renal failure.  3. Follow CBG and adjust meds as needed.   Discharge Diagnoses:   ARF (acute renal failure),  ATN (acute tubular necrosis)   Large cell (diffuse) non-Hodgkin's lymphoma   Nausea and vomiting, resolved.   Abdominal  pain, other specified site, probably secondary to lymphoma.    Type II or unspecified type diabetes mellitus with ketoacidosis, not stated as uncontrolled   Antineoplastic chemotherapy induced pancytopenia   New onset atrial fibrillation with RVR   Anorexia   Constipation due to pain medication     Discharge Condition: Stable.   Diet recommendation: Heart Healthy, renal diet.   Filed Weights   02/10/13 2100 02/11/13 2125 02/12/13 2125  Weight: 76.5 kg (168 lb 10.4 oz) 75.206 kg (165 lb 12.8 oz) 75.07 kg (165 lb 8 oz)    History of present illness:  72 year old white woman initially admitted to [facility] hospital on 01/28/13 by Dr. 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 She developed acute renal failure that has worsened to the point where she is uremic today, with increased confusion and jerking movements of her upper and lower extremities. Her creatinine is up to 4.3 from 1.9 on admission. Renal ultrasound without evidence for obstruction or hydronephrosis. She has been followed by Dr. Arlean Hopping who has requested transfer to [facility] cone today to start dialysis given her uremic symptoms. [facility] was asked to assist with transfer. Multiple family members present at time of my interview including her 3  children.   Hospital Course:  ARF (acute renal failure) on CKD (chronic kidney disease) stage 2, GFR 60-89 ml/min/ATN (acute tubular necrosis)  -started HD this admit via femoral access - fem cath out on 02/08/13  - dialyzed 4/11 then held and monitor response. Patient has not required more dialysis. -? ATN from chemorxn  -Hypokalemia: replaced with IV 3 runs and will provide prescription for 20 meq PO daily for 3 days. .  -Cr decreases with IV fluids. Plan is to follow up with PCP on Monday for repeat B-met to follow renal function.  -Continue to hold lasix and ACE.  Hypertension: Continue with metoprolol and Cardizem.  Anemia: Hb decrease to 6. Patient to received 2 units PRBC 4-16. Hb increase to 11.  Febrile neutropenia  Received full antibiotic treatment With cefepime and primaxin for 1 week - cultures negative. stoped abx 4/12  Pancytopenia from chemo  Received neulasta and neutropenia has resolved by 02/08/13  Thrombocytopenia started to improve  Atrial fibrillation with RVR  -was on Pradaxa pre admit but was dc'd due to thrombocytopenia.  -will need to follow up with Dr Myna Hidalgo outpatient prior to considering resume anticoagulation. Per Dr Myna Hidalgo note, he would continue to hold anticoagulation. Patient at high risk for bleeding.  -RVR during 1st HD tx-resolved  -required CCB gtt then converted to NSR so have transitioned to po BB and CCB  -patient with paroxysmal A fib.  Large cell (diffuse) non-Hodgkin's lymphoma/ Antineoplastic chemotherapy induced pancytopenia  - s/p R-ICE 01/23/13  -per oncology  -transfused platelets and received Neupogen (4/10)  -WBC increasing, at 22.  Patient afebrile.  Type II or unspecified type diabetes mellitus with ketoacidosis, controlled  -was on Amaryl pre admit  History of CAD/HTN  -Pradaxa on hold  -intolerant to statins  -ACE I on hold due to ARF requiring HD- will NOT resume  Nausea and vomiting/Anorexia  -due to recent chemo tx and  underlying uremia  - resolved  Abdominal pain, other specified site  -improved. Chronic pain. Probably secondary to lymphoma.  Constipation due to pain medication  - resolved    Procedures:  none  Consultations: Nephrology  Oncology   Discharge Exam: Filed Vitals:   02/12/13 1800 02/12/13 2125 02/13/13 0510 02/13/13 0913  BP: 178/80 195/75 176/88 179/84  Pulse: 67 71 81 70  Temp: 98.6 F (37 C) 98.2 F (36.8 C) 98.1 F (36.7 C) 98.4 F (36.9 C)  TempSrc: Oral Oral Oral Oral  Resp:  18 18 18   Height:  5\' 2"  (1.575 m)    Weight:  75.07 kg (165 lb 8 oz)    SpO2: 95% 94% 95% 96%    General: no distress. Cardiovascular: S 1, S 2 RRR Respiratory: CTA  Discharge Instructions  Discharge Orders   Future Appointments Provider Department Dept Phone   02/18/2013 1:15 PM Rachael Fee [facility] [phone]   02/18/2013 1:45 PM Josph Macho, MD [place] [facility] [phone]   02/18/2013 2:30 PM [facility] [place] [facility] [phone]   Future Orders Complete By Expires     Diet - low sodium heart healthy  As directed     Increase activity slowly  As directed         Medication List    STOP taking these medications       allopurinol 100 MG tablet  Commonly known as:  ZYLOPRIM     dabigatran 75 MG Caps  Commonly known as:  PRADAXA     furosemide 40 MG tablet  Commonly known as:  LASIX     glimepiride 4 MG tablet  Commonly known as:  AMARYL     levofloxacin 500 MG tablet  Commonly known as:  LEVAQUIN     lisinopril 20 MG tablet  Commonly known as:  PRINIVIL,ZESTRIL     sodium bicarbonate/sodium chloride Soln      TAKE these medications       antiseptic oral rinse Liqd  15 mLs by Mouth Rinse route 4 (four) times daily.     cholecalciferol 1000 UNITS tablet  Commonly known as:  VITAMIN D  Take 2,000 Units by mouth daily.     diltiazem 60 MG tablet  Commonly  known as:  CARDIZEM  Take 1 tablet (60 mg total) by mouth every 6 (six) hours.     DSS 100 MG Caps  Take 200 mg by mouth 3 (three) times daily as needed for constipation.     esomeprazole 40 MG capsule  Commonly known as:  NEXIUM  Take 40 mg by mouth daily.     lactose free nutrition Liqd  Take 237 mLs by mouth 3 (three) times daily with meals.     lidocaine-prilocaine cream  Commonly known as:  EMLA  Apply 1 application topically as needed (for port-a-cath access.).     LORazepam 1 MG tablet  Commonly known as:  ATIVAN  Take 1 tablet (1 mg total) by mouth every 6 (six) hours as needed (Nausea or vomiting.).     metoprolol 50 MG  tablet  Commonly known as:  LOPRESSOR  Take 50 mg by mouth 2 (two) times daily.     potassium chloride 10 MEQ tablet  Commonly known as:  K-DUR  Take 2 tablets (20 mEq total) by mouth daily.     prochlorperazine 10 MG tablet  Commonly known as:  COMPAZINE  Take 10 mg by mouth every 6 (six) hours as needed (for nausea/vomiting.).     traMADol 50 MG tablet  Commonly known as:  ULTRAM  Take 50-100 mg by mouth every 6 (six) hours as needed for pain.          The results of significant diagnostics from this hospitalization (including imaging, microbiology, ancillary and laboratory) are listed below for reference.    Significant Diagnostic Studies: X-ray Chest Pa And Lateral   01/21/2013  *RADIOLOGY REPORT*  Clinical Data: Hypertension, diabetes, lymphoma  CHEST - 2 VIEW  Comparison: PET CT dated 12/26/2012.  Chest radiographs dated 05/11/2011.  Findings: Lungs are clear. No pleural effusion or pneumothorax.  Cardiomediastinal silhouette is within normal limits.  Mild degenerative changes of the visualized thoracolumbar spine.  IMPRESSION: No evidence of acute cardiopulmonary disease.   Original Report Authenticated By: Charline Bills, M.D.    Abd 1 View (kub)  01/28/2013  *RADIOLOGY REPORT*  Clinical Data: Abdominal pain  ABDOMEN - 1 VIEW   Comparison: None.  Findings: No disproportionate dilatation of bowel.  No obvious free intraperitoneal gas.  Degenerative changes in the spine. Postoperative changes in the pelvis.  IMPRESSION: Nonobstructive bowel gas pattern.   Original Report Authenticated By: Jolaine Click, M.D.    US Renal  02/03/2013  *RADIOLOGY REPORT*  Clinical Data: Acute renal failure, lymphoma  RENAL/URINARY TRACT ULTRASOUND COMPLETE  Comparison:  11/1938 03/2013  Findings:  Right Kidney:  12.5 cm length.  Normal cortex and echogenicity. Mildly prominent medullary pyramids.  No hydronephrosis or obstruction.  Left Kidney:  10.7 cm length.  Normal cortex and echogenicity.  No hydronephrosis. Small upper pole renal cyst noted, difficult to visualize by ultrasound but present on the prior CT.  Bladder:  Decompressed by a Foley catheter.  IMPRESSION: No acute finding or hydronephrosis.   Original Report Authenticated By: Judie [name]. Miles Costain, M.D.    Ir Fluoro Guide Cv Line Right  01/23/2013  *RADIOLOGY REPORT*  Clinical Data: Recurrent lymphoma.  The patient requires a Port-A- Cath to begin chemotherapy.  IMPLANTED PORT A CATH PLACEMENT WITH ULTRASOUND AND FLUOROSCOPIC GUIDANCE  Sedation:  1.5 mg IV Versed; 75 mcg IV Fentanyl.  Total Moderate Sedation Time:  48 minutes.  Additional Medications:  1 gram IV Ancef.  As antibiotic prophylaxis, Ancef was ordered pre-procedure and administered intravenously within one hour of incision.  Fluoroscopy Time:  1.0 minutes.  Procedure:  The procedure, risks, benefits, and alternatives were explained to the patient.  Questions regarding the procedure were encouraged and answered.  The patient understands and consents to the procedure.  The right neck and chest were prepped with chlorhexidine in a sterile fashion, and a sterile drape was applied covering the operative field.  Maximum barrier sterile technique with sterile gowns and gloves were used for the procedure.  Local anesthesia was provided with 1%  lidocaine and lidocaine with epinephrine.  After creating a small venotomy incision, a 21 gauge needle was advanced into the right internal jugular vein under direct, real- time ultrasound guidance.  Ultrasound image documentation was performed.  After securing guidewire access, an 8 Fr dilator was placed.  A J-wire was kinked to  measure appropriate catheter length.  A subcutaneous port pocket was then created along the upper chest wall utilizing sharp and blunt dissection.  Portable cautery was utilized.  The pocket was irrigated with sterile saline.  A single lumen power injectable port was chosen for placement.  The 8 Fr catheter was tunneled from the port pocket site to the venotomy incision.  The port was placed in the pocket and secured with two Ethilon tacking sutures.  External catheter was trimmed to appropriate length based on guidewire measurement.  At the venotomy, an 8 Fr peel-away sheath was placed over a guidewire.  The catheter was then placed through the sheath and the sheath removed.  Final catheter positioning was confirmed and documented with a fluoroscopic spot image.  The port was accessed with a needle and aspirated and flushed with heparinized saline. The needle was left in place for immediate use.  The venotomy and port pocket incisions were closed with subcutaneous 3-0 Monocryl and subcuticular 4-0 Vicryl.  Dermabond was applied to both incisions.  Complications: None.  No pneumothorax.  Findings:  After catheter placement, the tip lies at the cavoatrial junction.  The catheter aspirates normally and is ready for immediate use.  IMPRESSION:  Placement of single lumen port a cath via right internal jugular vein.  The catheter tip lies at the cavoatrial junction.  A power injectable port a cath was placed and is ready for immediate use.   Original Report Authenticated By: Irish Lack, M.D.    Ir US Guide Vasc Access Right  01/23/2013  *RADIOLOGY REPORT*  Clinical Data: Recurrent  lymphoma.  The patient requires a Port-A- Cath to begin chemotherapy.  IMPLANTED PORT A CATH PLACEMENT WITH ULTRASOUND AND FLUOROSCOPIC GUIDANCE  Sedation:  1.5 mg IV Versed; 75 mcg IV Fentanyl.  Total Moderate Sedation Time:  48 minutes.  Additional Medications:  1 gram IV Ancef.  As antibiotic prophylaxis, Ancef was ordered pre-procedure and administered intravenously within one hour of incision.  Fluoroscopy Time:  1.0 minutes.  Procedure:  The procedure, risks, benefits, and alternatives were explained to the patient.  Questions regarding the procedure were encouraged and answered.  The patient understands and consents to the procedure.  The right neck and chest were prepped with chlorhexidine in a sterile fashion, and a sterile drape was applied covering the operative field.  Maximum barrier sterile technique with sterile gowns and gloves were used for the procedure.  Local anesthesia was provided with 1% lidocaine and lidocaine with epinephrine.  After creating a small venotomy incision, a 21 gauge needle was advanced into the right internal jugular vein under direct, real- time ultrasound guidance.  Ultrasound image documentation was performed.  After securing guidewire access, an 8 Fr dilator was placed.  A J-wire was kinked to measure appropriate catheter length.  A subcutaneous port pocket was then created along the upper chest wall utilizing sharp and blunt dissection.  Portable cautery was utilized.  The pocket was irrigated with sterile saline.  A single lumen power injectable port was chosen for placement.  The 8 Fr catheter was tunneled from the port pocket site to the venotomy incision.  The port was placed in the pocket and secured with two Ethilon tacking sutures.  External catheter was trimmed to appropriate length based on guidewire measurement.  At the venotomy, an 8 Fr peel-away sheath was placed over a guidewire.  The catheter was then placed through the sheath and the sheath removed.  Final  catheter positioning was confirmed and  documented with a fluoroscopic spot image.  The port was accessed with a needle and aspirated and flushed with heparinized saline. The needle was left in place for immediate use.  The venotomy and port pocket incisions were closed with subcutaneous 3-0 Monocryl and subcuticular 4-0 Vicryl.  Dermabond was applied to both incisions.  Complications: None.  No pneumothorax.  Findings:  After catheter placement, the tip lies at the cavoatrial junction.  The catheter aspirates normally and is ready for immediate use.  IMPRESSION:  Placement of single lumen port a cath via right internal jugular vein.  The catheter tip lies at the cavoatrial junction.  A power injectable port a cath was placed and is ready for immediate use.   Original Report Authenticated By: Irish Lack, M.D.    Ct Maxillofacial W/cm  01/21/2013  *RADIOLOGY REPORT*  Clinical Data: Swelling over the right orbit.  History of lymphoma.  CT MAXILLOFACIAL WITH CONTRAST  Technique:  Multidetector CT imaging of the maxillofacial structures was performed with intravenous contrast. Multiplanar CT image reconstructions were also generated.  Contrast: OMNIPAQUE IOHEXOL 300 MG/ML  SOLN  Comparison: PET CT 12/26/2012.  Findings: There is a soft tissue mass in the subcutaneous fat overlying the right orbital rim.  In retrospect this was present and hot on the prior PET CT.  This certainly could be lymphoma. There is also a small cystic lesion in the right parotid gland which could be a necrotic lymph node or a cystic mass.  I do not see this for certain on the PET CT.  The globes are intact.  No intraorbital mass.  The lacrimal glands appear normal.  The visualized portion of the brain is unremarkable.  An empty sella is noted.  The paranasal sinuses and mastoid air cells are clear.  No there are borderline enlarged right-sided level II to IV lymph nodes which in retrospect show increased FDG uptake and are suspicious  for lymphoma.  IMPRESSION:  1.  Subcutaneous soft tissue mass measuring 23 x 9 mm near the right orbit.  This is suspicious for lymphoma and in retrospect was hot on the prior PET CT. 2.  Enlarged right-sided neck nodes suspicious for lymphoma. 3.  Small necrotic lymph node versus small cystic mass in the right parotid gland.   Original Report Authenticated By: Rudie Meyer, M.D.    Dg Chest Port 1 View  02/03/2013  *RADIOLOGY REPORT*  Clinical Data: Follow up of pulmonary edema.  History of hypertension and myocardial infarction.  PORTABLE CHEST - 1 VIEW  Comparison: 01/31/2013  Findings: Right-sided Port-A-Cath terminates at the low SVC. Midline trachea.  Borderline cardiomegaly.  Possible small left pleural effusion. No pneumothorax.  Pulmonary interstitial prominence is slightly increased.  Patchy left base air space disease is also progressive.  IMPRESSION: Increase in pulmonary interstitial prominence, suspicious for mild pulmonary venous congestion.  Probable small left pleural effusion with slight increase in adjacent atelectasis.   Original Report Authenticated By: Jeronimo Greaves, M.D.    Dg Chest Port 1 View  01/31/2013  *RADIOLOGY REPORT*  Clinical Data: Fever.  PORTABLE CHEST - 1 VIEW  Comparison: 01/25/2013  Findings: The patient has a right-sided power port, tip to the level of superior vena cava.  Patient is slightly rotated towards the left.  Heart size is upper limits normal.  There has been improvement in the appearance of pulmonary edema.  There are no new focal consolidations or pleural effusions.  Minimal bibasilar atelectasis or scarring is noted.  IMPRESSION:  1.  Improvement in interstitial edema. 2.  No new consolidations.   Original Report Authenticated By: Norva Pavlov, M.D.    Dg Chest Port 1 View  01/25/2013  *RADIOLOGY REPORT*  Clinical Data: Wheezing with mid chest tightness.  PORTABLE CHEST - 1 VIEW  Comparison: 01/21/2013.  Findings: There is a right IJ power port with its tip  at the lower SVC level.  The heart size and mediastinal contours are stable. However, there is new diffuse interstitial prominence with septal lines, most consistent with pulmonary edema.  There is no focal airspace disease or pleural effusion.  There is no pneumothorax.  IMPRESSION: New diffuse interstitial prominence most consistent with pulmonary edema.  No pneumothorax following Port-A-Cath placement.   Original Report Authenticated By: Carey Bullocks, M.D.     Microbiology: Recent Results (from the past 240 hour(s))  MRSA PCR SCREENING     Status: None   Collection Time    02/04/13 12:36 PM      Result Value Range Status   MRSA by PCR NEGATIVE  NEGATIVE Final   Comment:            The GeneXpert MRSA Assay (FDA     approved for NASAL specimens     only), is one component of a     comprehensive MRSA colonization     surveillance program. It is not     intended to diagnose MRSA     infection nor to guide or     monitor treatment for     MRSA infections.     Labs: Basic Metabolic Panel:  Recent Labs Lab 02/08/13 0510 02/09/13 1040 02/10/13 0500 02/11/13 0610 02/11/13 1530 02/12/13 0540 02/13/13 0415  NA 142 141 141 146*  --  140 139  K 3.4* 3.7 3.2* 2.1* 3.7 2.9* 3.1*  CL 105 104 104 117*  --  101 102  CO2 27 26 25 20   --  26 25  GLUCOSE 82 162* 83 74  --  102* 111*  BUN 32* 35* 36* 29*  --  38* 36*  CREATININE 4.17* 4.90* 5.33* 4.31*  --  5.83* 5.52*  CALCIUM 8.4 8.6 8.5 5.9*  --  8.3* 8.1*  MG 1.8  --   --   --   --   --   --   PHOS  --   --  2.1* 1.9*  --  3.2 3.3   Liver Function Tests:  Recent Labs Lab 02/10/13 0500 02/11/13 0610 02/12/13 0540 02/13/13 0415  ALBUMIN 2.1* 1.6* 2.4* 2.3*   No results found for this basename: LIPASE, AMYLASE,  in the last 168 hours No results found for this basename: AMMONIA,  in the last 168 hours CBC:  Recent Labs Lab 02/09/13 1040 02/10/13 0500 02/11/13 0610 02/12/13 0540 02/13/13 0415  WBC 20.7* 21.8* 16.6*  22.0* 20.7*  HGB 9.5* 8.8* 6.7* 11.7* 11.6*  HCT 28.1* 25.6* 19.1* 32.4* 33.2*  MCV 80.7 80.0 80.3 77.9* 78.7  PLT 25* 38* 44* 65* 81*   Cardiac Enzymes: No results found for this basename: CKTOTAL, CKMB, CKMBINDEX, TROPONINI,  in the last 168 hours BNP: BNP (last 3 results) No results found for this basename: PROBNP,  in the last 8760 hours CBG:  Recent Labs Lab 02/12/13 1124 02/12/13 1649 02/12/13 2119 02/13/13 0729 02/13/13 1139  GLUCAP 164* 140* 153* 115* 178*       Signed:  Talar Fraley  [facility] 02/13/2013, 11:57 AM

## 2013-02-14 NOTE — Discharge Summary (Signed)
[name], [name] NO.:  [id number]  MEDICAL RECORD NO.:  [id number]  LOCATION:                                 FACILITY:  PHYSICIAN:  Josph Macho, M.D.  DATE OF BIRTH:  August 08, 1941  DATE OF ADMISSION:  01/21/2013 DATE OF DISCHARGE:  01/27/2013                              DISCHARGE SUMMARY   DIAGNOSES UPON DISCHARGE: 1. Relapsed large-cell non-Hodgkin's lymphoma, status post cycle 1 of     chemotherapy ICE-R. 2. Hypertension. 3. Hyperglycemia. 4. Nausea  and vomiting.  CONDITION ON DISCHARGE:  Stable.  ACTIVITIES:  As tolerated.  FOLLOWUP: 1. The patient will continue to follow up at the [facility] on Tuesday for a Neulasta shot. 2. We will plan to see her back in a week.  MEDICATION UPON DISCHARGE:  Pradaxa 75 mg p.o. b.i.d., Nexium 40 mg p.o. daily, Levaquin 500 mg p.o. daily, Lasix 40 mg p.o. q.a.m., Amaryl 4 mg p.o. daily, lisinopril 10 mg p.o. daily, Lopressor 50 mg p.o. b.i.d., allopurinol 100 mg p.o. daily, Compazine 10 mg p.o. q.6 hours p.r.n., and Ativan 1 mg p.o. q.6 hours p.r.n. nausea and vomiting.  HOSPITAL COURSE:  [name] was admitted for chemotherapy.  She has relapsed large cell non-Hodgkin's lymphoma.  This was a biopsy positive. We admitted her.  She did need to have a Port-A-Cath placed.  This was done without difficulties.  There is some slight soreness at the site.  We went ahead and got her started on chemotherapy.  We did have her on some allopurinol to help minimize tumor lysis.  I did decrease her dose of chemotherapy a little bit.  She received Rituxan 375 mg/m2 on day #1.  Her total dose of Rituxan was 700 mg.  Her etoposide was dose 80 mg/m2 per day for 3 days.  She received a total of 150 mg a day.  Her carboplatin dose was AUC of 5.  She received a dose of 460 mg a day #2.  She got ifosfamide at a dose of 4000 mg/m2.  She did have some nausea afterwards.  We did give her IV  fluids.  We did keep her for a couple days.  She was ready to go home on January 26, 2013.  She was eating okay.  She is urinating without difficulties.  Her blood sugars were up a little bit, but we were able to keep them controlled.  On the January 26, 2013, her white count was 4.9, hemoglobin 7.3, hematocrit 23, platelet count 283.  Of note, we did give her 2 units of blood prior to discharge.  We actually were able to let her go home on January 27, 2013.  Her metabolic panel at that time, showed a sodium 139, potassium 3, BUN 22, creatinine 1.33.  We did go ahead and gave her some potassium runs prior to discharge. Her medications were readjusted.  We did put her on some Levaquin upon discharge for prophylaxis for infection.  We will have her come back to the office the next day, so that we can give her Neulasta.  We will check her lab work, and see how she  is feeling.     Josph Macho, M.D.     PRE/MEDQ  D:  02/13/2013  T:  02/14/2013  Job:  [id number]

## 2013-02-14 NOTE — Discharge Summary (Deleted)
NAME:  [name], [name]             ACCOUNT NO.:  [id number]  MEDICAL RECORD NO.:  [id number]  LOCATION:                                 FACILITY:  PHYSICIAN:  Peter R Ennever, M.D.  DATE OF BIRTH:  05/18/1941  DATE OF ADMISSION:  01/21/2013 DATE OF DISCHARGE:  01/27/2013                              DISCHARGE SUMMARY   DIAGNOSES UPON DISCHARGE: 1. Relapsed large-cell non-Hodgkin's lymphoma, status post cycle 1 of     chemotherapy ICE-R. 2. Hypertension. 3. Hyperglycemia. 4. Nausea  and vomiting.  CONDITION ON DISCHARGE:  Stable.  ACTIVITIES:  As tolerated.  FOLLOWUP: 1. The patient will continue to follow up at the [facility] on Tuesday for a Neulasta shot. 2. We will plan to see her back in a week.  MEDICATION UPON DISCHARGE:  Pradaxa 75 mg p.o. b.i.d., Nexium 40 mg p.o. daily, Levaquin 500 mg p.o. daily, Lasix 40 mg p.o. q.a.m., Amaryl 4 mg p.o. daily, lisinopril 10 mg p.o. daily, Lopressor 50 mg p.o. b.i.d., allopurinol 100 mg p.o. daily, Compazine 10 mg p.o. q.6 hours p.r.n., and Ativan 1 mg p.o. q.6 hours p.r.n. nausea and vomiting.  HOSPITAL COURSE:  [name] was admitted for chemotherapy.  She has relapsed large cell non-Hodgkin's lymphoma.  This was a biopsy positive. We admitted her.  She did need to have a Port-A-Cath placed.  This was done without difficulties.  There is some slight soreness at the site.  We went ahead and got her started on chemotherapy.  We did have her on some allopurinol to help minimize tumor lysis.  I did decrease her dose of chemotherapy a little bit.  She received Rituxan 375 mg/m2 on day #1.  Her total dose of Rituxan was 700 mg.  Her etoposide was dose 80 mg/m2 per day for 3 days.  She received a total of 150 mg a day.  Her carboplatin dose was AUC of 5.  She received a dose of 460 mg a day #2.  She got ifosfamide at a dose of 4000 mg/m2.  She did have some nausea afterwards.  We did give her IV  fluids.  We did keep her for a couple days.  She was ready to go home on January 26, 2013.  She was eating okay.  She is urinating without difficulties.  Her blood sugars were up a little bit, but we were able to keep them controlled.  On the January 26, 2013, her white count was 4.9, hemoglobin 7.3, hematocrit 23, platelet count 283.  Of note, we did give her 2 units of blood prior to discharge.  We actually were able to let her go home on January 27, 2013.  Her metabolic panel at that time, showed a sodium 139, potassium 3, BUN 22, creatinine 1.33.  We did go ahead and gave her some potassium runs prior to discharge. Her medications were readjusted.  We did put her on some Levaquin upon discharge for prophylaxis for infection.  We will have her come back to the office the next day, so that we can give her Neulasta.  We will check her lab work, and see how she   is feeling.     Peter R Ennever, M.D.     PRE/MEDQ  D:  02/13/2013  T:  02/14/2013  Job:  [id number] 

## 2013-02-16 ENCOUNTER — Telehealth: Payer: Self-pay | Admitting: Hematology & Oncology

## 2013-02-16 NOTE — Telephone Encounter (Signed)
Pt aware 4-23 cx and 4-30 scheduled

## 2013-02-18 ENCOUNTER — Other Ambulatory Visit: Payer: Medicare Other | Admitting: Lab

## 2013-02-18 ENCOUNTER — Ambulatory Visit: Payer: Medicare Other

## 2013-02-18 ENCOUNTER — Ambulatory Visit: Payer: Medicare Other | Admitting: Hematology & Oncology

## 2013-02-24 ENCOUNTER — Telehealth: Payer: Self-pay | Admitting: Hematology & Oncology

## 2013-02-24 NOTE — Telephone Encounter (Signed)
Patient called and cx 02/25/13 apt and resch for 03/04/13

## 2013-02-25 ENCOUNTER — Ambulatory Visit: Payer: Self-pay | Admitting: Medical

## 2013-02-25 ENCOUNTER — Other Ambulatory Visit: Payer: Self-pay | Admitting: Lab

## 2013-02-25 ENCOUNTER — Ambulatory Visit: Payer: Self-pay

## 2013-03-04 ENCOUNTER — Encounter (HOSPITAL_COMMUNITY): Payer: Self-pay | Admitting: *Deleted

## 2013-03-04 ENCOUNTER — Ambulatory Visit (HOSPITAL_BASED_OUTPATIENT_CLINIC_OR_DEPARTMENT_OTHER): Payer: Medicare Other | Admitting: Medical

## 2013-03-04 ENCOUNTER — Ambulatory Visit: Payer: Self-pay

## 2013-03-04 ENCOUNTER — Emergency Department (HOSPITAL_COMMUNITY)
Admission: EM | Admit: 2013-03-04 | Discharge: 2013-03-05 | Disposition: A | Discharge status: Home / Self Care | Payer: Medicare Other | Attending: Emergency Medicine | Admitting: Emergency Medicine

## 2013-03-04 ENCOUNTER — Other Ambulatory Visit (HOSPITAL_BASED_OUTPATIENT_CLINIC_OR_DEPARTMENT_OTHER): Payer: Medicare Other | Admitting: Lab

## 2013-03-04 VITALS — BP 136/79 | HR 90 | Temp 98.1°F | Resp 16 | Ht 62.0 in | Wt 173.0 lb

## 2013-03-04 DIAGNOSIS — I252 Old myocardial infarction: Secondary | ICD-10-CM | POA: Insufficient documentation

## 2013-03-04 DIAGNOSIS — C8330 Diffuse large B-cell lymphoma, unspecified site: Secondary | ICD-10-CM

## 2013-03-04 DIAGNOSIS — R1031 Right lower quadrant pain: Secondary | ICD-10-CM

## 2013-03-04 DIAGNOSIS — C8589 Other specified types of non-Hodgkin lymphoma, extranodal and solid organ sites: Secondary | ICD-10-CM | POA: Insufficient documentation

## 2013-03-04 DIAGNOSIS — Z9861 Coronary angioplasty status: Secondary | ICD-10-CM | POA: Insufficient documentation

## 2013-03-04 DIAGNOSIS — N170 Acute kidney failure with tubular necrosis: Secondary | ICD-10-CM

## 2013-03-04 DIAGNOSIS — R52 Pain, unspecified: Secondary | ICD-10-CM | POA: Insufficient documentation

## 2013-03-04 DIAGNOSIS — N039 Chronic nephritic syndrome with unspecified morphologic changes: Secondary | ICD-10-CM | POA: Insufficient documentation

## 2013-03-04 DIAGNOSIS — I129 Hypertensive chronic kidney disease with stage 1 through stage 4 chronic kidney disease, or unspecified chronic kidney disease: Secondary | ICD-10-CM | POA: Insufficient documentation

## 2013-03-04 DIAGNOSIS — D631 Anemia in chronic kidney disease: Secondary | ICD-10-CM | POA: Insufficient documentation

## 2013-03-04 DIAGNOSIS — Z79899 Other long term (current) drug therapy: Secondary | ICD-10-CM | POA: Insufficient documentation

## 2013-03-04 DIAGNOSIS — R220 Localized swelling, mass and lump, head: Secondary | ICD-10-CM

## 2013-03-04 DIAGNOSIS — N182 Chronic kidney disease, stage 2 (mild): Secondary | ICD-10-CM

## 2013-03-04 DIAGNOSIS — F411 Generalized anxiety disorder: Secondary | ICD-10-CM | POA: Insufficient documentation

## 2013-03-04 DIAGNOSIS — E119 Type 2 diabetes mellitus without complications: Secondary | ICD-10-CM | POA: Insufficient documentation

## 2013-03-04 DIAGNOSIS — D6481 Anemia due to antineoplastic chemotherapy: Secondary | ICD-10-CM

## 2013-03-04 DIAGNOSIS — Z87891 Personal history of nicotine dependence: Secondary | ICD-10-CM | POA: Insufficient documentation

## 2013-03-04 DIAGNOSIS — R112 Nausea with vomiting, unspecified: Secondary | ICD-10-CM

## 2013-03-04 DIAGNOSIS — Z90710 Acquired absence of both cervix and uterus: Secondary | ICD-10-CM | POA: Insufficient documentation

## 2013-03-04 DIAGNOSIS — Z8679 Personal history of other diseases of the circulatory system: Secondary | ICD-10-CM | POA: Insufficient documentation

## 2013-03-04 DIAGNOSIS — Z9089 Acquired absence of other organs: Secondary | ICD-10-CM | POA: Insufficient documentation

## 2013-03-04 DIAGNOSIS — I209 Angina pectoris, unspecified: Secondary | ICD-10-CM | POA: Insufficient documentation

## 2013-03-04 LAB — CBC WITH DIFFERENTIAL (CANCER CENTER ONLY)
BASO%: 1.3 % (ref 0.0–2.0)
HCT: 31.8 % — ABNORMAL LOW (ref 34.8–46.6)
LYMPH%: 24.4 % (ref 14.0–48.0)
MCV: 85 fL (ref 81–101)
MONO#: 1.3 10*3/uL — ABNORMAL HIGH (ref 0.1–0.9)
NEUT%: 52.9 % (ref 39.6–80.0)
RDW: 18.7 % — ABNORMAL HIGH (ref 11.1–15.7)
WBC: 9 10*3/uL (ref 3.9–10.0)

## 2013-03-04 LAB — CHCC SATELLITE - SMEAR

## 2013-03-04 NOTE — ED Notes (Signed)
Pt c/o sudden onset severe lower right sided abd pain; nausea and vomiting; moaning on arrival; pale; recent admission for same

## 2013-03-04 NOTE — Progress Notes (Signed)
DIAGNOSIS:  Recurrent large cell non-Hodgkin lymphoma.  CURRENT THERAPY:  Patient is status post cycle 1 of R-ICE.  INTERIM HISTORY:  Ms. Hunsberger presents today post.  Followup from a hospitalization.  She was hospitalized from 01/28/2013 through 02/13/2013.  We actually ended up seeing her in the office on 01/28/2013, secondary to abdominal pain, nausea, vomiting, related to a relapse of her large cell lymphoma.  She just completed 1 cycle of R-ICE.  Unfortunately, she had developed, acute renal failure, that worsened to the point where she was uremic with increased confusion and jerking movements of her upper and lower extremities.  Her creatinine went up to 4.3, from 1.9, on admission.  She did have a renal ultrasound.  That did not reveal any evidence for obstruction or hydronephrosis.  She did start dialysis.  Given her uremic symptoms.  She was quite hypokalemic and was repleted with potassium.  Her creatinine did decrease with IV fluids.  Her Lasix and ACE inhibitor has been on hold.  Her hemoglobin did drop to 6, and she received 2 units of packed red blood cells.  She did have some febrile, neutropenia and was placed on empiric antibiotics for one week.  Her cultures were negative.  She did have some atrial fibrillation with RVR.  We did have to hold her Pradaxa pre-admit, secondary to her thrombocytopenia. she did proceed to recover quite nicely.  Throughout her hospitalization.  She, reports, that she feels much better since she has been out.  She is currently receiving physical therapy.  She still gets quite fatigued.  Her appetite is somewhat better.  She is urinating without any problems.  She does have some intermittent nausea.  She denies any active, vomiting.  She does have some constipation, but no diarrhea.  She denies any fevers, chills, or night sweats.  She denies any lower leg swelling.  She denies any obvious, or abnormal bleeding.  She does have some intermittent abdominal pain.  She  denies any headaches, visual changes, or rashes.  Unfortunately, she, reports, that she's noticed some right facial swelling, actually above her right eye.  This is initially, how she presented with her recurrence.  We will go ahead and set her up with a PET scan after [facility].   Review of Systems: Constitutional:Negative for malaise/fatigue, fever, chills, weight loss, diaphoresis, activity change, appetite change, and unexpected weight change.  HEENT: Negative for double vision, blurred vision, visual loss, ear pain, tinnitus, congestion, rhinorrhea, epistaxis sore throat or sinus disease, oral pain/lesion, tongue soreness Respiratory: Negative for cough, chest tightness, shortness of breath, wheezing and stridor.  Cardiovascular: Negative for chest pain, palpitations, leg swelling, orthopnea, PND, DOE or claudication Gastrointestinal: Negative for nausea, vomiting, abdominal pain, diarrhea, constipation, blood in stool, melena, hematochezia, abdominal distention, anal bleeding, rectal pain, anorexia and hematemesis.  Genitourinary: Negative for dysuria, frequency, hematuria,  Musculoskeletal: Negative for myalgias, back pain, joint swelling, arthralgias and gait problem.  Skin: Negative for rash, color change, pallor and wound.  Neurological:. Negative for dizziness/light-headedness, tremors, seizures, syncope, facial asymmetry, speech difficulty, weakness, numbness, headaches and paresthesias.  Hematological: Negative for adenopathy. Does not bruise/bleed easily.  Psychiatric/Behavioral:  Negative for depression, no loss of interest in normal activity or change in sleep pattern.   Physical Exam: this is a pleasant, 72 year old, well-developed, well-nourished, white female, in no obvious distress  Vitals: Temperature 97.8 degrees, pulse 58, respirations 16, blood pressure 136/62.  Weight 156 pounds HEENT reveals a normocephalic, atraumatic skull, no scleral icterus, no  oral lesions  Neck is  supple without any cervical or supraclavicular adenopathy.  Lungs are clear to auscultation bilaterally. There are no wheezes, rales or rhonci Cardiac is regular rate and rhythm with a normal S1 and S2. There are no murmurs, rubs, or bruits.  Abdomen is soft with good bowel sounds, there is no palpable mass. There is no palpable hepatosplenomegaly. There is no palpable fluid wave.  Musculoskeletal no tenderness of the spine, ribs, or hips.  Extremities there are no clubbing, cyanosis, or edema.  Skin no petechia, purpura or ecchymosis Neurologic is nonfocal.  Laboratory Data:  white count 9.0, hemoglobin 10.6, hematocrit 31.8, platelets 295,000   Current Outpatient Prescriptions on File Prior to Visit  Medication Sig Dispense Refill  . cholecalciferol (VITAMIN D) 1000 UNITS tablet Take 2,000 Units by mouth daily.        Marland Kitchen docusate sodium 100 MG CAPS Take 200 mg by mouth 3 (three) times daily as needed for constipation.  60 capsule  2  . esomeprazole (NEXIUM) 40 MG capsule Take 40 mg by mouth daily.        . metoprolol (LOPRESSOR) 50 MG tablet Take 50 mg by mouth 2 (two) times daily.      . prochlorperazine (COMPAZINE) 10 MG tablet Take 10 mg by mouth every 8 (eight) hours as needed (for nausea/vomiting.). AS NEEDED      . traMADol (ULTRAM) 50 MG tablet Take 50-100 mg by mouth every 6 (six) hours as needed for pain. AS NEEDED       No current facility-administered medications on file prior to visit.     Assessment/Plan: this is a pleasant, 72 year old, female, with the following issues:  #1.  Recurrent large cell lymphoma.  She is status post 1 cycle of R-ICE.  Unfortunately, she did not tolerate this well at all.  She actually ended up being hospitalized for about 2 weeks.  She did go into severe renal failure and had to be on dialysis.  We will not be giving her any further chemotherapy.  At this time.  I think the main thing now, is to set her up with a PET scan and see what kind of,  response she's had after 1, cycle.  I am concerned about her right-sided facial swelling, as this is how she presented with her recurrence.  We will set her PET scan out, after [facility] Day.  #2.  Acute renal failure/acute tubular necrosis.  This was possibly secondary to chemotherapy.  She is following up with a nephrologist.  #3.  Febrile neutropenia.  This has resolved.  #4.  Anemia.  This is secondary to chemotherapy.  She was transfused with 2 units packed red blood cells in the hospital.  Her hemoglobin looks good.  Today.  #5.  New onset atrial fibrillation with RVR.  She was taken off of back for and that will remain on hold.  #6.  Constipation.  Advised her to get over-the-counter MiraLax and continue on a stool softener.   #7.  Followup.  Ms. Bells.  Will follow back up with [place] in 3 weeks, but before then should there be questions or concerns.  Addendum: This patient was seen and examined by Dr. Myna Hidalgo , who is in agreement with the above assessment and plan. Dr. Myna Hidalgo also reviewed Mrs. Peavler's medications with her face-to-face, and made the appropriate adjustments.  Ms. Chaidez voiced her understanding regarding her new medication list.  Ms. Clendenin understands to call should there be any questions  or concerns.

## 2013-03-05 ENCOUNTER — Other Ambulatory Visit: Payer: Self-pay | Admitting: *Deleted

## 2013-03-05 ENCOUNTER — Emergency Department (HOSPITAL_COMMUNITY): Payer: Medicare Other

## 2013-03-05 ENCOUNTER — Other Ambulatory Visit: Payer: Self-pay | Admitting: Hematology & Oncology

## 2013-03-05 DIAGNOSIS — N390 Urinary tract infection, site not specified: Secondary | ICD-10-CM

## 2013-03-05 LAB — CBC WITH DIFFERENTIAL/PLATELET
Basophils Absolute: 0.1 10*3/uL (ref 0.0–0.1)
Basophils Relative: 0 % (ref 0–1)
Eosinophils Relative: 2 % (ref 0–5)
HCT: 32.2 % — ABNORMAL LOW (ref 36.0–46.0)
MCHC: 34.2 g/dL (ref 30.0–36.0)
MCV: 81.7 fL (ref 78.0–100.0)
Monocytes Absolute: 1.3 10*3/uL — ABNORMAL HIGH (ref 0.1–1.0)
Platelets: 305 10*3/uL (ref 150–400)
RDW: 18.4 % — ABNORMAL HIGH (ref 11.5–15.5)

## 2013-03-05 LAB — COMPREHENSIVE METABOLIC PANEL
AST: 12 U/L (ref 0–37)
Albumin: 2.9 g/dL — ABNORMAL LOW (ref 3.5–5.2)
Calcium: 9 mg/dL (ref 8.4–10.5)
Creatinine, Ser: 5.74 mg/dL — ABNORMAL HIGH (ref 0.50–1.10)
Total Protein: 6.4 g/dL (ref 6.0–8.3)

## 2013-03-05 LAB — URINE MICROSCOPIC-ADD ON

## 2013-03-05 LAB — URINALYSIS, ROUTINE W REFLEX MICROSCOPIC
Glucose, UA: 500 mg/dL — AB
Ketones, ur: NEGATIVE mg/dL
Leukocytes, UA: NEGATIVE
Protein, ur: 100 mg/dL — AB
Urobilinogen, UA: 0.2 mg/dL (ref 0.0–1.0)

## 2013-03-05 MED ORDER — HYDROMORPHONE HCL PF 1 MG/ML IJ SOLN
1.0000 mg | Freq: Once | INTRAMUSCULAR | Status: AC
  Administered 2013-03-05: 1 mg via INTRAVENOUS
  Filled 2013-03-05: qty 1

## 2013-03-05 MED ORDER — SODIUM CHLORIDE 0.9 % IV SOLN
1000.0000 mL | INTRAVENOUS | Status: DC
  Administered 2013-03-05: 1000 mL via INTRAVENOUS

## 2013-03-05 MED ORDER — OXYCODONE-ACETAMINOPHEN 5-325 MG PO TABS: 1.0000 | ORAL_TABLET | Freq: Three times a day (TID) | ORAL | Status: DC | PRN

## 2013-03-05 MED ORDER — SODIUM CHLORIDE 0.9 % IV BOLUS (SEPSIS)
500.0000 mL | Freq: Once | INTRAVENOUS | Status: AC
  Administered 2013-03-05: 500 mL via INTRAVENOUS

## 2013-03-05 MED ORDER — FENTANYL CITRATE 0.05 MG/ML IJ SOLN
50.0000 ug | Freq: Once | INTRAMUSCULAR | Status: AC
  Administered 2013-03-05: 50 ug via INTRAVENOUS
  Filled 2013-03-05: qty 2

## 2013-03-05 MED ORDER — PROMETHAZINE HCL 25 MG/ML IJ SOLN
12.5000 mg | Freq: Once | INTRAMUSCULAR | Status: AC
  Administered 2013-03-05: 12.5 mg via INTRAVENOUS
  Filled 2013-03-05: qty 1

## 2013-03-05 MED ORDER — AMOXICILLIN-POT CLAVULANATE 875-125 MG PO TABS: ORAL_TABLET | ORAL | Status: DC

## 2013-03-05 MED ORDER — ONDANSETRON HCL 4 MG/2ML IJ SOLN
4.0000 mg | Freq: Once | INTRAMUSCULAR | Status: AC
  Administered 2013-03-05: 4 mg via INTRAVENOUS
  Filled 2013-03-05: qty 2

## 2013-03-05 MED ORDER — HEPARIN SOD (PORK) LOCK FLUSH 100 UNIT/ML IV SOLN
INTRAVENOUS | Status: AC
  Administered 2013-03-05: 500 [IU]
  Filled 2013-03-05: qty 5

## 2013-03-05 NOTE — ED Provider Notes (Signed)
History     CSN: [id number]  Arrival date & time 03/04/13  2334   First MD Initiated Contact with Patient 03/05/13 0054      Chief Complaint  Patient presents with  . Abdominal Pain    HPI [name] is a 72 y.o. female presenting with acute onset of right lower corner pain is sharp, intermittently worse, severe, associated with nausea and vomiting.  Patient had a similar episode recently with acute flare of her large cell lymphoma. Patient was hospitalized 02/17/2013 through 02/13/2013 during the time she developed acute renal failure, severe anemia. Patient has been doing well since her discharge from the hospital except for today with the acute onset of right lower quadrant pain. She's also had constipation associated with narcotic medicine. She denies any fevers, chills, diarrhea, has had some constipation, no sore throat, runny nose, productive cough, chest pain.    Past Medical History  Diagnosis Date  . Diabetes mellitus without complication   . Hypertension   . Anginal pain   . Headache   . Anxiety     h/o anxiety attacks  . Myocardial infarction 2011  . Cancer     lymphoma  . Arthritis     Left hip  . Anemia of renal disease 09/17/2012  . Bleeding     Past Surgical History  Procedure Laterality Date  . Abdominal hysterectomy      30 yrs. ago  . Appendectomy  2009  . Tonsillectomy      age 81  . Cardiac catheterization  2011  . Rotator cuff repair      Right arm  . Esophagogastroduodenoscopy (egd) with propofol  10/06/2012    Procedure: ESOPHAGOGASTRODUODENOSCOPY (EGD) WITH PROPOFOL;  Surgeon: Willis Modena, MD;  Location: [facility];  Service: Endoscopy;  Laterality: N/A;  . Colonoscopy with propofol  10/06/2012    Procedure: COLONOSCOPY WITH PROPOFOL;  Surgeon: Willis Modena, MD;  Location: [facility];  Service: Endoscopy;  Laterality: N/A;  . Colonoscopy with propofol N/A 01/14/2013    Procedure: COLONOSCOPY WITH PROPOFOL;  Surgeon: Willis Modena,  MD;  Location: [facility];  Service: Endoscopy;  Laterality: N/A;  . Portacath placement Right   . Portacath placement Right 01/22/2013    No family history on file.  History  Substance Use Topics  . Smoking status: Former Smoker -- 0.25 packs/day for 15 years  . Smokeless tobacco: Never Used  . Alcohol Use: No    OB History   Grav Para Term Preterm Abortions TAB SAB Ect Mult Living                  Review of Systems At least 10pt or greater review of systems completed and are negative except where specified in the HPI.  Allergies  Advicor; Crestor; Lipitor; Lovastatin; Morphine and related; Niaspan; Nsaids; Pravachol; Sulfa antibiotics; Vytorin; Welchol; Zetia; and Zocor  Home Medications   Current Outpatient Rx  Name  Route  Sig  Dispense  Refill  . cholecalciferol (VITAMIN D) 1000 UNITS tablet   Oral   Take 2,000 Units by mouth daily.           Marland Kitchen diltiazem (CARDIZEM) 60 MG tablet   Oral   Take 60 mg by mouth 4 (four) times daily.         Marland Kitchen docusate sodium 100 MG CAPS   Oral   Take 200 mg by mouth 3 (three) times daily as needed for constipation.   60 capsule   2   .  esomeprazole (NEXIUM) 40 MG capsule   Oral   Take 40 mg by mouth daily.           . metoprolol (LOPRESSOR) 50 MG tablet   Oral   Take 50 mg by mouth 2 (two) times daily.         . ondansetron (ZOFRAN-ODT) 8 MG disintegrating tablet   Oral   Take 8 mg by mouth every 8 (eight) hours as needed for nausea. AS NEEDED         . prochlorperazine (COMPAZINE) 10 MG tablet   Oral   Take 10 mg by mouth every 8 (eight) hours as needed (for nausea/vomiting.). AS NEEDED         . traMADol (ULTRAM) 50 MG tablet   Oral   Take 50-100 mg by mouth every 6 (six) hours as needed for pain. AS NEEDED           BP 208/86  Pulse 66  Temp(Src) 99.1 F (37.3 C)  Resp 24  SpO2 99%  Physical Exam  Nursing notes reviewed.  Electronic medical record reviewed. VITAL SIGNS:   Filed Vitals:    03/04/13 2343 03/05/13 0100 03/05/13 0324  BP: 208/86 175/70 147/58  Pulse: 66 58 59  Temp: 99.1 F (37.3 C)    Resp: 24 19 15   SpO2: 99% 100% 97%   CONSTITUTIONAL: Awake, oriented, appears non-toxic HENT: Atraumatic, normocephalic, oral mucosa pink and moist, airway patent. Nares patent without drainage. External ears normal. EYES: Conjunctiva clear, EOMI, PERRLA NECK: Trachea midline, non-tender, supple CARDIOVASCULAR: Normal heart rate, Normal rhythm, No murmurs, rubs, gallops PULMONARY/CHEST: Clear to auscultation, no rhonchi, wheezes, or rales. Symmetrical breath sounds. Non-tender. ABDOMINAL: Non-distended, soft, non-tender -tender to palpation in the right lower quadrant.  BS decreased. NEUROLOGIC: Non-focal, moving all four extremities, no gross sensory or motor deficits. EXTREMITIES: No clubbing, cyanosis, or edema SKIN: Warm, Dry, No erythema, No rash  ED Course  Procedures (including critical care time)  Labs Reviewed  URINALYSIS, ROUTINE W REFLEX MICROSCOPIC - Abnormal; Notable for the following:    APPearance CLOUDY (*)    Glucose, UA 500 (*)    Hgb urine dipstick SMALL (*)    Protein, ur 100 (*)    All other components within normal limits  CBC WITH DIFFERENTIAL - Abnormal; Notable for the following:    WBC 20.0 (*)    Hemoglobin 11.0 (*)    HCT 32.2 (*)    RDW 18.4 (*)    Neutrophils Relative 80 (*)    Neutro Abs 15.9 (*)    Monocytes Absolute 1.3 (*)    All other components within normal limits  COMPREHENSIVE METABOLIC PANEL - Abnormal; Notable for the following:    Sodium 131 (*)    Potassium 3.3 (*)    Chloride 91 (*)    Glucose, Bld 186 (*)    BUN 40 (*)    Creatinine, Ser 5.74 (*)    Albumin 2.9 (*)    GFR calc non Af Amer 7 (*)    GFR calc Af Amer 8 (*)    All other components within normal limits  URINE MICROSCOPIC-ADD ON - Abnormal; Notable for the following:    Squamous Epithelial / LPF FEW (*)    Bacteria, UA FEW (*)    All other components  within normal limits  URINE CULTURE  LIPASE, BLOOD   Dg Abd Acute W/chest  03/05/2013  *RADIOLOGY REPORT*  Clinical Data: Abdominal pain  ACUTE ABDOMEN SERIES (ABDOMEN 2 VIEW &  CHEST 1 VIEW)  Comparison: Chest radiograph 02/03/2013  Findings: Power port on the right with tip in distal SVC.  Normal cardiac silhouette.  Lungs are clear.  There is mild atelectasis at the left lung base.  No pneumothorax.  Left lateral decubitus view demonstrates no intraperitoneal free air.  No dilated loops of large or small bowel.  There is gas the rectum.  IMPRESSION:  1.  Left basilar atelectasis.  Otherwise no acute cardiopulmonary findings. 2.  No evidence of intraperitoneal free air or bowel obstruction.   Original Report Authenticated By: Genevive Bi, M.D.      1. Abdominal pain, right lower quadrant   2. CKD (chronic kidney disease) stage 2, GFR 60-89 ml/min   3. Nausea and vomiting    Medications  0.9 %  sodium chloride infusion (0 mLs Intravenous Stopped 03/05/13 0324)  ondansetron (ZOFRAN) injection 4 mg (4 mg Intravenous Given 03/05/13 0049)  fentaNYL (SUBLIMAZE) injection 50 mcg (50 mcg Intravenous Given 03/05/13 0058)  HYDROmorphone (DILAUDID) injection 1 mg (1 mg Intravenous Given 03/05/13 0155)  promethazine (PHENERGAN) injection 12.5 mg (12.5 mg Intravenous Given 03/05/13 0153)  sodium chloride 0.9 % bolus 500 mL (0 mLs Intravenous Stopped 03/05/13 0214)  heparin lock flush 100 UNIT/ML injection (500 Units  Given 03/05/13 0326)    MDM  Patient presents with right lower part pain, she is status post appendectomy. Patient has known mass in the right lower quadrant with large cell lymphoma. Patient is afebrile, she is uncomfortable but is nontoxic. Do not think she's got an infectious process at this time. My concern is primarily for obstruction with acute onset of nausea vomiting, as well as this mass of tumor tissue.  After treatment with pain medicine, nausea medicine, fluid bolus, patient is feeling  much better. She says her pain is completely gone at this point. No obstruction is seen on x-ray.  Reexam the patient's abdomen is soft and nontender.  Patient did follow up today with PA from oncology, think the patient can followup with her primary care physician, or oncology again in the next day.  Patient's labs appear to be at her new baseline, they're not very much different from when she was discharged from the hospital. She still has renal failure which is now chronic, white blood cell count is elevated, think this is likely status post Neulasta.  Pt is pain free, no evidence of infection.  Pt wishes to be DC to home, will have her f/u with PCP or oncology.  Return to the ED with worsening symptoms or sx/ c/w obstruction.  Jones Skene, MD 03/05/13 [phone]

## 2013-03-05 NOTE — ED Notes (Signed)
Patient transported to X-ray 

## 2013-03-06 LAB — URINE CULTURE

## 2013-03-07 ENCOUNTER — Encounter (HOSPITAL_COMMUNITY): Payer: Self-pay | Admitting: *Deleted

## 2013-03-07 ENCOUNTER — Emergency Department (HOSPITAL_COMMUNITY): Payer: Medicare Other

## 2013-03-07 ENCOUNTER — Other Ambulatory Visit: Payer: Self-pay

## 2013-03-07 ENCOUNTER — Emergency Department (HOSPITAL_COMMUNITY)
Admission: EM | Admit: 2013-03-07 | Discharge: 2013-03-07 | Disposition: A | Discharge status: Home / Self Care | Payer: Medicare Other | Attending: Emergency Medicine | Admitting: Emergency Medicine

## 2013-03-07 DIAGNOSIS — R110 Nausea: Secondary | ICD-10-CM | POA: Insufficient documentation

## 2013-03-07 DIAGNOSIS — Z8739 Personal history of other diseases of the musculoskeletal system and connective tissue: Secondary | ICD-10-CM | POA: Insufficient documentation

## 2013-03-07 DIAGNOSIS — Z862 Personal history of diseases of the blood and blood-forming organs and certain disorders involving the immune mechanism: Secondary | ICD-10-CM | POA: Insufficient documentation

## 2013-03-07 DIAGNOSIS — Z87898 Personal history of other specified conditions: Secondary | ICD-10-CM | POA: Insufficient documentation

## 2013-03-07 DIAGNOSIS — M545 Low back pain, unspecified: Secondary | ICD-10-CM | POA: Insufficient documentation

## 2013-03-07 DIAGNOSIS — Z87891 Personal history of nicotine dependence: Secondary | ICD-10-CM | POA: Insufficient documentation

## 2013-03-07 DIAGNOSIS — M549 Dorsalgia, unspecified: Secondary | ICD-10-CM

## 2013-03-07 DIAGNOSIS — F411 Generalized anxiety disorder: Secondary | ICD-10-CM | POA: Insufficient documentation

## 2013-03-07 DIAGNOSIS — I10 Essential (primary) hypertension: Secondary | ICD-10-CM | POA: Insufficient documentation

## 2013-03-07 DIAGNOSIS — I252 Old myocardial infarction: Secondary | ICD-10-CM | POA: Insufficient documentation

## 2013-03-07 DIAGNOSIS — Z8679 Personal history of other diseases of the circulatory system: Secondary | ICD-10-CM | POA: Insufficient documentation

## 2013-03-07 DIAGNOSIS — R0602 Shortness of breath: Secondary | ICD-10-CM | POA: Insufficient documentation

## 2013-03-07 DIAGNOSIS — Z9089 Acquired absence of other organs: Secondary | ICD-10-CM | POA: Insufficient documentation

## 2013-03-07 DIAGNOSIS — Z79899 Other long term (current) drug therapy: Secondary | ICD-10-CM | POA: Insufficient documentation

## 2013-03-07 DIAGNOSIS — E119 Type 2 diabetes mellitus without complications: Secondary | ICD-10-CM | POA: Insufficient documentation

## 2013-03-07 DIAGNOSIS — Z90710 Acquired absence of both cervix and uterus: Secondary | ICD-10-CM | POA: Insufficient documentation

## 2013-03-07 DIAGNOSIS — R1084 Generalized abdominal pain: Secondary | ICD-10-CM | POA: Insufficient documentation

## 2013-03-07 DIAGNOSIS — R0789 Other chest pain: Secondary | ICD-10-CM

## 2013-03-07 LAB — URINALYSIS, ROUTINE W REFLEX MICROSCOPIC
Glucose, UA: 250 mg/dL — AB
Ketones, ur: NEGATIVE mg/dL
pH: 5 (ref 5.0–8.0)

## 2013-03-07 LAB — URINE MICROSCOPIC-ADD ON

## 2013-03-07 LAB — BASIC METABOLIC PANEL
CO2: 24 mEq/L (ref 19–32)
Chloride: 97 mEq/L (ref 96–112)
Creatinine, Ser: 5.85 mg/dL — ABNORMAL HIGH (ref 0.50–1.10)
Glucose, Bld: 107 mg/dL — ABNORMAL HIGH (ref 70–99)

## 2013-03-07 LAB — POCT I-STAT TROPONIN I: Troponin i, poc: 0.04 ng/mL (ref 0.00–0.08)

## 2013-03-07 MED ORDER — SODIUM CHLORIDE 0.9 % IV SOLN
Freq: Once | INTRAVENOUS | Status: AC
  Administered 2013-03-07: 09:00:00 via INTRAVENOUS

## 2013-03-07 MED ORDER — HYDROMORPHONE HCL PF 1 MG/ML IJ SOLN
1.0000 mg | Freq: Once | INTRAMUSCULAR | Status: AC
  Administered 2013-03-07: 1 mg via INTRAVENOUS
  Filled 2013-03-07: qty 1

## 2013-03-07 MED ORDER — TECHNETIUM TC 99M DIETHYLENETRIAME-PENTAACETIC ACID
46.6000 | Freq: Once | INTRAVENOUS | Status: AC | PRN
  Administered 2013-03-07: 46.6 via INTRAVENOUS

## 2013-03-07 MED ORDER — OXYCODONE-ACETAMINOPHEN 5-325 MG PO TABS: 1.0000 | ORAL_TABLET | Freq: Three times a day (TID) | ORAL | Status: DC | PRN

## 2013-03-07 MED ORDER — TECHNETIUM TO 99M ALBUMIN AGGREGATED
5.8000 | Freq: Once | INTRAVENOUS | Status: AC | PRN
  Administered 2013-03-07: 6 via INTRAVENOUS

## 2013-03-07 MED ORDER — HEPARIN SOD (PORK) LOCK FLUSH 100 UNIT/ML IV SOLN
500.0000 [IU] | Freq: Once | INTRAVENOUS | Status: AC
  Administered 2013-03-07: 500 [IU] via INTRAVENOUS
  Filled 2013-03-07: qty 5

## 2013-03-07 NOTE — ED Provider Notes (Signed)
History     CSN: [id number]  Arrival date & time 03/07/13  7829   First MD Initiated Contact with Patient 03/07/13 [phone]      Chief Complaint  Patient presents with  . Chest Pain    Left  . Back Pain    (Consider location/radiation/quality/duration/timing/severity/associated sxs/prior treatment) HPI  Patient is a 72 yo F PMHx significant for active large cell lymphoma, anginal pain, HTN, DM presenting to ED with central chest pain and left sided flank pain. Pt states her pain began Wednesday after discharge from ED for abdominal pain and was at its worst this morning around 6-7AM. Patient has been taking home pain medications that she was discharged with and those have not been helping. She has had associated SOB when the chest becomes severe 10/10 pain. Associated nausea w/o vomiting. Pt is not currently being treated for her cancer, she did not respond well to her first chemotherapy at the end of March and her oncologist has talked about starting radiation therapy, but that has not been started yet. Patient is due for a PET scan at the end of May or early June. Denies fevers, chills, urinary symptoms, leg swelling, diarrhea.    Past Medical History  Diagnosis Date  . Diabetes mellitus without complication   . Hypertension   . Anginal pain   . Headache   . Anxiety     h/o anxiety attacks  . Myocardial infarction 2011  . Cancer     lymphoma  . Arthritis     Left hip  . Anemia of renal disease 09/17/2012  . Bleeding     Past Surgical History  Procedure Laterality Date  . Abdominal hysterectomy      30 yrs. ago  . Appendectomy  2009  . Tonsillectomy      age 46  . Cardiac catheterization  2011  . Rotator cuff repair      Right arm  . Esophagogastroduodenoscopy (egd) with propofol  10/06/2012    Procedure: ESOPHAGOGASTRODUODENOSCOPY (EGD) WITH PROPOFOL;  Surgeon: Willis Modena, MD;  Location: [facility];  Service: Endoscopy;  Laterality: N/A;  . Colonoscopy with  propofol  10/06/2012    Procedure: COLONOSCOPY WITH PROPOFOL;  Surgeon: Willis Modena, MD;  Location: [facility];  Service: Endoscopy;  Laterality: N/A;  . Colonoscopy with propofol N/A 01/14/2013    Procedure: COLONOSCOPY WITH PROPOFOL;  Surgeon: Willis Modena, MD;  Location: [facility];  Service: Endoscopy;  Laterality: N/A;  . Portacath placement Right   . Portacath placement Right 01/22/2013    No family history on file.  History  Substance Use Topics  . Smoking status: Former Smoker -- 0.25 packs/day for 15 years  . Smokeless tobacco: Never Used  . Alcohol Use: No    OB History   Grav Para Term Preterm Abortions TAB SAB Ect Mult Living                  Review of Systems  Constitutional: Negative for fever and chills.  HENT: Negative for neck pain.   Eyes: Negative for visual disturbance.  Respiratory: Positive for chest tightness and shortness of breath.   Cardiovascular: Positive for chest pain. Negative for palpitations.  Gastrointestinal: Positive for nausea and abdominal pain. Negative for vomiting.  Genitourinary: Positive for flank pain. Negative for dysuria and difficulty urinating.  Musculoskeletal: Positive for back pain.  Skin: Negative.   Neurological: Negative for light-headedness and headaches.    Allergies  Advicor; Crestor; Lipitor; Lovastatin; Morphine and related; Niaspan;  Nsaids; Pravachol; Sulfa antibiotics; Vytorin; Welchol; Zetia; and Zocor  Home Medications   Current Outpatient Rx  Name  Route  Sig  Dispense  Refill  . cholecalciferol (VITAMIN D) 1000 UNITS tablet   Oral   Take 2,000 Units by mouth daily.           Marland Kitchen diltiazem (CARDIZEM) 60 MG tablet   Oral   Take 60 mg by mouth 4 (four) times daily.         Marland Kitchen docusate sodium 100 MG CAPS   Oral   Take 200 mg by mouth 3 (three) times daily as needed for constipation.   60 capsule   2   . esomeprazole (NEXIUM) 40 MG capsule   Oral   Take 40 mg by mouth daily.           Marland Kitchen  lidocaine-prilocaine (EMLA) cream   Topical   Apply 1 application topically as needed (for port-a-cath access).         . metoprolol (LOPRESSOR) 50 MG tablet   Oral   Take 50 mg by mouth 2 (two) times daily.         . ondansetron (ZOFRAN-ODT) 8 MG disintegrating tablet   Oral   Take 8 mg by mouth every 8 (eight) hours as needed for nausea.          Marland Kitchen oxyCODONE-acetaminophen (PERCOCET/ROXICET) 5-325 MG per tablet   Oral   Take 1-2 tablets by mouth every 8 (eight) hours as needed for pain.   17 tablet   0   . prochlorperazine (COMPAZINE) 10 MG tablet   Oral   Take 10 mg by mouth every 8 (eight) hours as needed (for nausea/vomiting.).          Marland Kitchen traMADol (ULTRAM) 50 MG tablet   Oral   Take 50-100 mg by mouth every 6 (six) hours as needed for pain.          Marland Kitchen oxyCODONE-acetaminophen (PERCOCET/ROXICET) 5-325 MG per tablet   Oral   Take 1-2 tablets by mouth every 8 (eight) hours as needed for pain.   15 tablet   0     BP 171/69  Pulse 74  Temp(Src) 98.9 F (37.2 C) (Oral)  Resp 18  SpO2 93%  Physical Exam  Constitutional: She is oriented to person, place, and time. She appears well-developed and well-nourished. No distress.  HENT:  Head: Normocephalic and atraumatic.  Mouth/Throat: Oropharynx is clear and moist. No oropharyngeal exudate.  Eyes: Conjunctivae and EOM are normal. Pupils are equal, round, and reactive to light.  Neck: Normal range of motion. Neck supple.  Cardiovascular: Normal rate, regular rhythm, normal heart sounds and intact distal pulses.   Pulmonary/Chest: Effort normal and breath sounds normal. No respiratory distress.  Abdominal: Soft. Bowel sounds are normal. She exhibits no distension. There is generalized tenderness. There is no rebound and no guarding.  Musculoskeletal: She exhibits no edema.  Neurological: She is alert and oriented to person, place, and time. No cranial nerve deficit.  Skin: Skin is warm and dry. No rash noted. She  is not diaphoretic.    ED Course  Procedures (including critical care time)   Date: 03/07/2013  Rate: 67  Rhythm: sinus rhythm  QRS Axis: normal  Intervals: QT prolonged  ST/T Wave abnormalities: normal  Conduction Disutrbances:right bundle branch block  Narrative Interpretation:   Old EKG Reviewed: unchanged    Labs Reviewed  BASIC METABOLIC PANEL - Abnormal; Notable for the following:    Potassium  3.0 (*)    Glucose, Bld 107 (*)    BUN 39 (*)    Creatinine, Ser 5.85 (*)    GFR calc non Af Amer 7 (*)    GFR calc Af Amer 8 (*)    All other components within normal limits  URINALYSIS, ROUTINE W REFLEX MICROSCOPIC - Abnormal; Notable for the following:    APPearance CLOUDY (*)    Glucose, UA 250 (*)    Hgb urine dipstick TRACE (*)    Protein, ur 100 (*)    Leukocytes, UA SMALL (*)    All other components within normal limits  URINE CULTURE  URINE MICROSCOPIC-ADD ON  POCT I-STAT TROPONIN I   Dg Chest 2 View  03/07/2013  *RADIOLOGY REPORT*  Clinical Data: Shortness of breath and left-sided chest pain. History of lymphoma.  CHEST - 2 VIEW  Comparison: 03/05/2013  Findings: Stable positioning of Port-A-Cath.  Lungs show low volumes with bibasilar atelectasis.  No focal infiltrate, pulmonary edema or pleural fluid is identified.  Heart size and mediastinal contours are within normal limits.  Visualized bony structures are unremarkable.  IMPRESSION: No active disease.  Low lung volumes with bibasilar atelectasis.   Original Report Authenticated By: Irish Lack, M.D.    Nm Pulmonary Perf And Vent  03/07/2013  *RADIOLOGY REPORT*  Clinical Data: Chest pain. Shortness of breath.  Non-Hodgkins lymphoma.  NM PULMONARY VENTILATION AND PERFUSION SCAN  Radiopharmaceutical: CURIE MAA TECHNETIUM TO 37M ALBUMIN AGGREGATED ; 46.6 mCi technetium DTPA inhaled  Comparison: Multiple exams, including 03/07/2013  Findings: Date reduction and lower lobe activity on both perfusion and  ventilation images ascribed to soft tissues of the chest wall. Minimal subsegmental notching of the left lung on the left anterior oblique view, likely corresponding to the major fissure region.  No worrisome perfusion defect identified.  IMPRESSION:  1. Very low probability (less than 10% risk) of acute pulmonary embolus.   Original Report Authenticated By: Gaylyn Rong, M.D.      1. Chest pain, atypical   2. Back pain       MDM  Patient is to be discharged with recommendation to follow up with her oncologist in regards to today's hospital visit. Chest pain is not likely of cardiac or pulmonary etiology d/t presentation, V/Q scan negative, VSS, no tracheal deviation, no JVD or new murmur, RRR, breath sounds equal bilaterally, EKG without acute abnormalities, negative troponin, and negative CXR. Pt has been advised to return to the ED is CP becomes exertional, associated with diaphoresis or nausea, radiates to left jaw/arm, worsens or becomes concerning in any way. Patient with back pain.  No neurological deficits and normal neuro exam.  Patient can walk but states is painful.  No loss of bowel or bladder control.  No concern for cauda equina.  No fever, night sweats, weight loss, h/o cancer, IVDU.  RICE protocol and pain medicine indicated and discussed with patient. Pain managed in ED. Pt appears reliable for follow up and is agreeable to discharge. Case has been discussed with and seen by Dr. Judd Lien who agrees with the above plan to discharge. Patient is stable at time of discharge          Jeannetta Ellis, PA-C 03/07/13 1628

## 2013-03-07 NOTE — ED Notes (Signed)
Pt has history of renal problems, Left flank pain this morning

## 2013-03-07 NOTE — ED Notes (Signed)
Placed patient on bedpan to urinate, patient did not have to go at this time.

## 2013-03-07 NOTE — ED Notes (Signed)
Patient transported to Nuc Med 

## 2013-03-08 LAB — URINE CULTURE: Colony Count: NO GROWTH

## 2013-03-08 NOTE — ED Provider Notes (Signed)
Medical screening examination/treatment/procedure(s) were conducted as a shared visit with non-physician practitioner(s) and myself.  I personally evaluated the patient during the encounter.  Patient with history of large cell lymphoma, DM, HTN.  Presents with complaints of pain in the left flank and left side of chest for the past several days.  She reports shortness of breath and the pain is worse with inspiration.  She denies fever, chills, or cough.    On exam, the patient is afebrile and the vitals are stable.  The heart is regular rate and rhythm and lungs are clear.  The abdomen is benign.  There is ttp in the anterior and left chest that reproduces her symptoms.  The extremities are without edema.    Workup reveals ekg with right bundle branch block with no acute changes.  The troponin is negative.  Doubt cardiac etiology.  She also underwent a V/Q scan to rule out pe as her creatinine was not conducive to ct dye.  The workup is negative and the symptoms seem musculoskeletal in nature.  Will treat with pain meds, rest.  Return prn.  Geoffery Lyons, MD 03/08/13 [phone]

## 2013-03-15 ENCOUNTER — Encounter: Payer: Self-pay | Admitting: Hematology & Oncology

## 2013-03-16 ENCOUNTER — Other Ambulatory Visit: Payer: Self-pay | Admitting: Hematology & Oncology

## 2013-03-16 DIAGNOSIS — C8330 Diffuse large B-cell lymphoma, unspecified site: Secondary | ICD-10-CM

## 2013-03-17 ENCOUNTER — Ambulatory Visit (HOSPITAL_BASED_OUTPATIENT_CLINIC_OR_DEPARTMENT_OTHER): Payer: Medicare Other | Admitting: Hematology & Oncology

## 2013-03-17 ENCOUNTER — Telehealth: Payer: Self-pay | Admitting: *Deleted

## 2013-03-17 ENCOUNTER — Other Ambulatory Visit (HOSPITAL_BASED_OUTPATIENT_CLINIC_OR_DEPARTMENT_OTHER): Payer: Medicare Other | Admitting: Lab

## 2013-03-17 ENCOUNTER — Ambulatory Visit (HOSPITAL_BASED_OUTPATIENT_CLINIC_OR_DEPARTMENT_OTHER): Payer: Medicare Other

## 2013-03-17 VITALS — BP 164/55 | HR 64 | Temp 98.0°F | Resp 16 | Ht 62.0 in | Wt 153.0 lb

## 2013-03-17 DIAGNOSIS — N189 Chronic kidney disease, unspecified: Secondary | ICD-10-CM

## 2013-03-17 DIAGNOSIS — E119 Type 2 diabetes mellitus without complications: Secondary | ICD-10-CM

## 2013-03-17 DIAGNOSIS — C8589 Other specified types of non-Hodgkin lymphoma, extranodal and solid organ sites: Secondary | ICD-10-CM

## 2013-03-17 DIAGNOSIS — D631 Anemia in chronic kidney disease: Secondary | ICD-10-CM

## 2013-03-17 DIAGNOSIS — C8330 Diffuse large B-cell lymphoma, unspecified site: Secondary | ICD-10-CM

## 2013-03-17 LAB — CBC WITH DIFFERENTIAL (CANCER CENTER ONLY)
BASO%: 0.3 % (ref 0.0–2.0)
EOS%: 4.6 % (ref 0.0–7.0)
HCT: 31.3 % — ABNORMAL LOW (ref 34.8–46.6)
LYMPH#: 4.2 10*3/uL — ABNORMAL HIGH (ref 0.9–3.3)
LYMPH%: 30.1 % (ref 14.0–48.0)
MCH: 28.5 pg (ref 26.0–34.0)
MCHC: 32.9 g/dL (ref 32.0–36.0)
MONO%: 10 % (ref 0.0–13.0)
NEUT%: 55 % (ref 39.6–80.0)
RDW: 20 % — ABNORMAL HIGH (ref 11.1–15.7)

## 2013-03-17 LAB — CMP (CANCER CENTER ONLY)
ALT(SGPT): 5 U/L — ABNORMAL LOW (ref 10–47)
AST: 9 U/L — ABNORMAL LOW (ref 11–38)
Alkaline Phosphatase: 79 U/L (ref 26–84)
BUN, Bld: 49 mg/dL — ABNORMAL HIGH (ref 7–22)
Chloride: 97 mEq/L — ABNORMAL LOW (ref 98–108)
Creat: 7 mg/dl (ref 0.6–1.2)
Total Bilirubin: 0.5 mg/dl (ref 0.20–1.60)

## 2013-03-17 MED ORDER — SODIUM CHLORIDE 0.9 % IV SOLN
500.0000 mL | INTRAVENOUS | Status: DC
  Administered 2013-03-17: 14:00:00 via INTRAVENOUS

## 2013-03-17 MED ORDER — SODIUM CHLORIDE 0.9 % IJ SOLN
10.0000 mL | INTRAMUSCULAR | Status: DC | PRN
  Administered 2013-03-17: 10 mL via INTRAVENOUS
  Filled 2013-03-17: qty 10

## 2013-03-17 MED ORDER — DEXAMETHASONE SODIUM PHOSPHATE 10 MG/ML IJ SOLN
10.0000 mg | Freq: Once | INTRAMUSCULAR | Status: AC
  Administered 2013-03-17: 10 mg via INTRAVENOUS

## 2013-03-17 MED ORDER — ONDANSETRON 8 MG/50ML IVPB (CHCC)
8.0000 mg | Freq: Once | INTRAVENOUS | Status: AC
  Administered 2013-03-17: 8 mg via INTRAVENOUS

## 2013-03-17 MED ORDER — HEPARIN SOD (PORK) LOCK FLUSH 100 UNIT/ML IV SOLN
500.0000 [IU] | Freq: Once | INTRAVENOUS | Status: AC
  Administered 2013-03-17: 500 [IU] via INTRAVENOUS
  Filled 2013-03-17: qty 5

## 2013-03-17 NOTE — Progress Notes (Signed)
CC:   [name], M.D. [facility]  DIAGNOSES: 1. Recurrent diffuse large cell non-Hodgkin's lymphoma. 2. Acute/chronic renal failure. 3. Diabetes.  CURRENT THERAPY:  Supportive care.  INTERIM HISTORY:  Ms. Jezek comes in for a visit.  Unfortunately, she is not doing too well at home.  She is having some issues with confusion.  She is not eating all that well.  She just feels very tired.  She apparently had been taking quite a bit of Percocet.  She has been to the emergency room with abdominal pain.  So far, tests have all been negative for any obvious etiology.  She is due for a PET scan in 2 days to try to sort out what is going on with respect to her lymphoma.  She had one cycle of salvage chemotherapy with reduced dose R-ICE.  She tolerated this incredibly poorly.  She ended up in the hospital for close to a month.  Her kidney functions deteriorated.  She required temporary dialysis.  Again, her appetite is not that good.  She just has no taste for food. She has had no diarrhea.  In fact, she has not had a bowel movement for about 4 days.  She has had no fever.  There have been no rashes on her skin.  The nodule above the right eye appears to be increasing in size.  It is not painful.  PHYSICAL EXAMINATION:  General:  This is an elderly white female in no obvious distress.  She does appear to be a little bit lethargic.  Vital signs:  Show a temperature of 98, pulse 64, respiratory rate 16, blood pressure 164/55.  Weight is 153.  Head and neck:  Shows a normocephalic, atraumatic skull.  There are no ocular or oral lesions.  She does have a fullness in the right supraorbital ridge.  Her extraocular muscles are intact.  Pupils react appropriately.  There is no adenopathy in the neck.  Lungs:  Clear bilaterally.  Cardiac:  Regular rate and rhythm with a normal S1, S2.  She does have an occasional extra beat.  She has a 1/6 systolic ejection murmur.   Abdomen:  Soft.  She has good bowel sounds.  There is no guarding or rebound tenderness.  There is no palpable hepatosplenomegaly.  Extremities:  Show no clubbing, cyanosis or edema.  Neurological:  Shows no focal neurological deficits.  Skin: Shows some dry skin.  She has no petechiae or ecchymoses.  Neurological: Shows no focal neurological deficits.  LABORATORY STUDIES:  White cell count 14, hemoglobin 10.3, hematocrit 31.3, platelet count 309.  Sodium 140, potassium 4.2, BUN 49, creatinine 7.  Glucose 137.  Calcium 9.5 with an albumin of 2.9.  IMPRESSION:  Ms. Guimont is a 72 year old white female with relapsed diffuse large cell non-Hodgkin's lymphoma.  Relapse has been about 2-1/2 to 3 years.  Again, she tried salvage chemotherapy which she tolerated incredibly poorly.  She ended up hospitalized.  She had multiple organ issues.  I worry that her kidneys are becoming more of an issue.  We will rehydrate her today.  We will see how this does for her.  Calcium, when corrected, is on the high side.  With IV fluids this should be able to help.  I think the PET scan will be very key in determining our future plans for her.  I want her back tomorrow for another round of IV fluids.  We will check her lab work also.    ______________________________  Josph Macho, M.D. PRE/MEDQ  D:  03/17/2013  T:  03/17/2013  Job:  [id number]

## 2013-03-17 NOTE — Progress Notes (Signed)
This office note has been dictated.

## 2013-03-17 NOTE — Patient Instructions (Addendum)
Chronic Renal Insufficiency Chronic renal insufficiency (also called kidney failure) occurs when there is kidney damage done. The damage prevents the kidneys from working like they should.  The kidneys do many important things. They:  Filter waste out of the blood.  Regulate the amount of water and various salts in the blood stream.  Produce chemicals that:  Prompt the bone marrow to make red blood cells.  Regulate blood pressure.  Keep calcium in balance throughout the bones and the body. When the kidneys are damaged, they can no longer filter waste products out of the blood. These substances build up in the blood, causing illness.  CAUSES   Diabetes.  High blood pressure.  Glomerular diseases: Conditions that damage the tiny blood vessels (glomeruli) within the kidneys, such as:  Membranous nephropathy.  IgA nephropathy.  Focal segmental glomerulosclerosis.  Poisons (such as overdoses or misuse of acetaminophen or NSAIDS, or exposure to other toxic substances).  Kidney injuries.  Kidney cancer or cancer that spreads to the kidney.  Medications such as NSAIDs. These problems are rare.  Kidney stones.  Alport disease.  Polycystic kidneys. SYMPTOMS  Most people do not notice symptoms of kidney failure until their kidney function drops below about 30-40% of normal. Symptoms can include:  Weakness.  Tiredness.  Frequent urination.  Intense need to urinate.  Excess bruising.  Low urine production.  Blood in the urine.  Pain in the kidney area.  Feeling sick to your stomach (nausea).  Vomiting.  Unusual bleeding.  Numbness in hands and feet.  Swelling in legs, arms and face.  Confusion. DIAGNOSIS  Your caregiver will look for signs of kidney failure. Tests to diagnose kidney failure may include:  Urine tests: May reveal the presence of blood, protein or sugar.  Blood tests: May show low red blood cell count (anemia) or high levels of waste  products (BUN and creatinine) that are normally filtered out of the bloodstream by the kidneys.  Imaging tests  These are tests that create pictures of the organs inside the abdomen, such as the kidneys. They may reveal masses growing in the kidneys or blockages to the flow of urine. Possible imaging tests may include:  Ultrasound.  CT scan.  MRI.  Intravenous pyelogram or IVP. This is a test that involves injecting dye into the bloodstream and then taking a series of x-rays of the kidneys. This allows the kidneys and other parts of the urinary system to be viewed more clearly.  Kidney biopsy  A small sample of kidney is removed using a special needle. The sample is examined for abnormalities under a microscope. TREATMENT  Chronic kidney failure cannot usually be cured. The various symptoms are treated, and measures are taken to avoid further kidney damage. Treatment for mild to moderate kidney failure may include:  Medication for high blood pressure.  Good control of diabetes.  Medication and diet change to improve anemia.  A low-sodium, low-potassium, low-protein and/or low-cholesterol diet.  Limiting the quantity of liquids in the diet. Treatment for more severe kidney failure may require:  Dialysis  Mechanical methods of filtering the blood.  Kidney transplant  An operation that removes the diseased kidney and replaces it with a donated kidney. HOME CARE INSTRUCTIONS   Take medication as told by your caregiver.  Quit smoking if you are a smoker. Talk to your caregiver about a smoking cessation program.  Follow your prescribed diet.  If you are prescribed vitamins, take them as told. SEEK IMMEDIATE MEDICAL CARE IF:  You start to produce less urine.  You notice blood in your urine.  You have increased pain.  You have increased weakness, fatigue or confusion.  You notice new swelling.  You develop a fever.  You feel that you are having side effects of medicines  prescribed. Document Released: 07/24/2008 Document Revised: 01/07/2012 Document Reviewed: 11/06/2010 [facility] Patient Information 2013 [place], [place]. Dehydration, Adult Dehydration means your body does not have as much fluid as it needs. Your kidneys, brain, and heart will not work properly without the right amount of fluids and salt.  HOME CARE  Ask your doctor how to replace body fluid losses (rehydrate).  Drink enough fluids to keep your pee (urine) clear or pale yellow.  Drink small amounts of fluids often if you feel sick to your stomach (nauseous) or throw up (vomit).  Eat like you normally do.  Avoid:  Foods or drinks high in sugar.  Bubbly (carbonated) drinks.  Juice.  Very hot or cold fluids.  Drinks with caffeine.  Fatty, greasy foods.  Alcohol.  Tobacco.  Eating too much.  Gelatin desserts.  Wash your hands to avoid spreading germs (bacteria, viruses).  Only take medicine as told by your doctor.  Keep all doctor visits as told. GET HELP RIGHT AWAY IF:   You cannot drink something without throwing up.  You get worse even with treatment.  Your vomit has blood in it or looks greenish.  Your poop (stool) has blood in it or looks black and tarry.  You have not peed in 6 to 8 hours.  You pee a small amount of very dark pee.  You have a fever.  You pass out (faint).  You have belly (abdominal) pain that gets worse or stays in one spot (localizes).  You have a rash, stiff neck, or bad headache.  You get easily annoyed, sleepy, or are hard to wake up.  You feel weak, dizzy, or very thirsty. MAKE SURE YOU:   Understand these instructions.  Will watch your condition.  Will get help right away if you are not doing well or get worse. Document Released: 08/11/2009 Document Revised: 01/07/2012 Document Reviewed: 06/04/2011 [facility] Patient Information 2013 [place], [place].

## 2013-03-18 ENCOUNTER — Encounter: Payer: Self-pay | Admitting: Hematology & Oncology

## 2013-03-18 ENCOUNTER — Ambulatory Visit (HOSPITAL_BASED_OUTPATIENT_CLINIC_OR_DEPARTMENT_OTHER): Payer: Medicare Other

## 2013-03-18 ENCOUNTER — Encounter: Payer: Self-pay | Admitting: Cardiology

## 2013-03-18 ENCOUNTER — Other Ambulatory Visit (HOSPITAL_BASED_OUTPATIENT_CLINIC_OR_DEPARTMENT_OTHER): Payer: Medicare Other | Admitting: Lab

## 2013-03-18 ENCOUNTER — Encounter (HOSPITAL_COMMUNITY)
Admission: RE | Admit: 2013-03-18 | Discharge: 2013-03-18 | Disposition: A | Discharge status: Home / Self Care | Payer: Medicare Other | Source: Ambulatory Visit | Attending: Hematology & Oncology | Admitting: Hematology & Oncology

## 2013-03-18 VITALS — BP 143/66 | HR 62 | Temp 97.5°F | Resp 18

## 2013-03-18 DIAGNOSIS — C8330 Diffuse large B-cell lymphoma, unspecified site: Secondary | ICD-10-CM

## 2013-03-18 DIAGNOSIS — E860 Dehydration: Secondary | ICD-10-CM

## 2013-03-18 DIAGNOSIS — N179 Acute kidney failure, unspecified: Secondary | ICD-10-CM

## 2013-03-18 DIAGNOSIS — C8589 Other specified types of non-Hodgkin lymphoma, extranodal and solid organ sites: Secondary | ICD-10-CM

## 2013-03-18 DIAGNOSIS — D631 Anemia in chronic kidney disease: Secondary | ICD-10-CM

## 2013-03-18 DIAGNOSIS — N189 Chronic kidney disease, unspecified: Secondary | ICD-10-CM

## 2013-03-18 LAB — CMP (CANCER CENTER ONLY)
ALT(SGPT): 7 U/L — ABNORMAL LOW (ref 10–47)
Alkaline Phosphatase: 71 U/L (ref 26–84)
CO2: 25 mEq/L (ref 18–33)
Creat: 6.7 mg/dl (ref 0.6–1.2)
Glucose, Bld: 106 mg/dL (ref 73–118)
Total Bilirubin: 0.5 mg/dl (ref 0.20–1.60)

## 2013-03-18 LAB — CBC WITH DIFFERENTIAL (CANCER CENTER ONLY)
BASO%: 0 % (ref 0.0–2.0)
HCT: 27.4 % — ABNORMAL LOW (ref 34.8–46.6)
LYMPH%: 26.8 % (ref 14.0–48.0)
MCH: 28.7 pg (ref 26.0–34.0)
MCV: 86 fL (ref 81–101)
MONO#: 1.1 10*3/uL — ABNORMAL HIGH (ref 0.1–0.9)
MONO%: 7.6 % (ref 0.0–13.0)
NEUT%: 65.6 % (ref 39.6–80.0)
RDW: 19.7 % — ABNORMAL HIGH (ref 11.1–15.7)
WBC: 13.8 10*3/uL — ABNORMAL HIGH (ref 3.9–10.0)

## 2013-03-18 MED ORDER — SODIUM CHLORIDE 0.9 % IV SOLN: Freq: Once | INTRAVENOUS | Status: DC

## 2013-03-18 MED ORDER — SODIUM CHLORIDE 0.9 % IV SOLN
INTRAVENOUS | Status: AC
  Administered 2013-03-18: 12:00:00 via INTRAVENOUS

## 2013-03-18 NOTE — Patient Instructions (Signed)
Dehydration, Adult Dehydration is when you lose more fluids from the body than you take in. Vital organs like the kidneys, brain, and heart cannot function without a proper amount of fluids and salt. Any loss of fluids from the body can cause dehydration.  CAUSES   Vomiting.  Diarrhea.  Excessive sweating.  Excessive urine output.  Fever. SYMPTOMS  Mild dehydration  Thirst.  Dry lips.  Slightly dry mouth. Moderate dehydration  Very dry mouth.  Sunken eyes.  Skin does not bounce back quickly when lightly pinched and released.  Dark urine and decreased urine production.  Decreased tear production.  Headache. Severe dehydration  Very dry mouth.  Extreme thirst.  Rapid, weak pulse (more than 100 beats per minute at rest).  Cold hands and feet.  Not able to sweat in spite of heat and temperature.  Rapid breathing.  Blue lips.  Confusion and lethargy.  Difficulty being awakened.  Minimal urine production.  No tears. DIAGNOSIS  Your caregiver will diagnose dehydration based on your symptoms and your exam. Blood and urine tests will help confirm the diagnosis. The diagnostic evaluation should also identify the cause of dehydration. TREATMENT  Treatment of mild or moderate dehydration can often be done at home by increasing the amount of fluids that you drink. It is best to drink small amounts of fluid more often. Drinking too much at one time can make vomiting worse. Refer to the home care instructions below. Severe dehydration needs to be treated at the hospital where you will probably be given intravenous (IV) fluids that contain water and electrolytes. HOME CARE INSTRUCTIONS   Ask your caregiver about specific rehydration instructions.  Drink enough fluids to keep your urine clear or pale yellow.  Drink small amounts frequently if you have nausea and vomiting.  Eat as you normally do.  Avoid:  Foods or drinks high in sugar.  Carbonated  drinks.  Juice.  Extremely hot or cold fluids.  Drinks with caffeine.  Fatty, greasy foods.  Alcohol.  Tobacco.  Overeating.  Gelatin desserts.  Wash your hands well to avoid spreading bacteria and viruses.  Only take over-the-counter or prescription medicines for pain, discomfort, or fever as directed by your caregiver.  Ask your caregiver if you should continue all prescribed and over-the-counter medicines.  Keep all follow-up appointments with your caregiver. SEEK MEDICAL CARE IF:  You have abdominal pain and it increases or stays in one area (localizes).  You have a rash, stiff neck, or severe headache.  You are irritable, sleepy, or difficult to awaken.  You are weak, dizzy, or extremely thirsty. SEEK IMMEDIATE MEDICAL CARE IF:   You are unable to keep fluids down or you get worse despite treatment.  You have frequent episodes of vomiting or diarrhea.  You have blood or green matter (bile) in your vomit.  You have blood in your stool or your stool looks black and tarry.  You have not urinated in 6 to 8 hours, or you have only urinated a small amount of very dark urine.  You have a fever.  You faint. MAKE SURE YOU:   Understand these instructions.  Will watch your condition.  Will get help right away if you are not doing well or get worse. Document Released: 10/15/2005 Document Revised: 01/07/2012 Document Reviewed: 06/04/2011 [employer] Patient Information 2014 [employer].  

## 2013-03-18 NOTE — Progress Notes (Signed)
Patient ID: [name], female   DOB: 22-Apr-1941, 72 y.o.   MRN: [id number] Kenneth, Lax  Date of visit:  03/18/2013 DOB:  1941/03/08    Age:  72 yrs. Medical record number:  [id number]     Account number:  [id number] Primary Care Provider: Shaune Pollack ____________________________ CURRENT DIAGNOSES  1. Arrhythmia-Atrial Fibrillation  2. Hyperlipidemia  3. Hypertension,Essential (Benign)  4. CAD,Native  5. GERD  6. Diabetes Mellitus-NIDD  7. Personal History Of Other Lymphatic And Hematopoietic Neoplasms  8. Long-term (current) use of anticoagulants  9. End Stage Renal Disease  10. Obesity(BMI30-40)  11. Chemo ____________________________ ALLERGIES  Bactrim  morphine ____________________________ MEDICATIONS  1. multivitamin Tablet, 1 p.o. daily  2. Nexium 40 mg Capsule, Delayed Release(E.C.), 1 p.o. daily  3. Astelin 137 mcg Aerosol, Spray, PRN  4. Vitamin D 1,000 unit Tablet, 2 p.o. daily  5. cetirizine 10 mg tablet, 1 p.o. daily  6. metoprolol tartrate 50 mg tablet, BID  7. ondansetron HCl 8 mg tablet, PRN  8. clonidine 0.1 mg tablet, BID ____________________________ CHIEF COMPLAINTS  Followup of Arrhythmia-Atrial Fibrillation  Followup of CAD,Native ____________________________ HISTORY OF PRESENT ILLNESS  Patient seen for cardiac followup. She has had an eventful course since she was previously here with a relapse of her lymphoma and has been treated with salvage chemotherapy to include Rituxin. Her she developed acute renal failure and had transient dialysis when she was in the hospital just a couple of months ago. 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 She has not had recurrence of atrial fibrillation since she has been out of the hospital but evidently had some atrial fibrillation when she was on dialysis. She is quite fatigued and has had significant anorexia.  She was discharged home 4 times a day diltiazem and felt that she had a lot of tremor and anorexia that improved after she ran out of it a couple of days ago. She states that she may be needing to have repeat dialysis in the future. She has been taken off of the Pradaxa as well as aspirin. ____________________________ PAST HISTORY  Past Medical Illnesses:  hypertension, DM-non-insulin dependent, hyperlipidemia, obesity, GERD, anxiety, lymphoma-non-Hodgkins, chronic kidney disease Stage 3;  Cardiovascular Illnesses:  atrial fibrillation, CAD;  Surgical Procedures:  appendectomy, hysterectomy, oophorectomy, lymph node bx, port-a-cath, shoulder surgery-rt;  Cardiology Procedures-Invasive:  cardiac cath (left) June 2012;  Cardiology Procedures-Noninvasive:  echocardiogram June 2012;  Cardiac Cath Results:  normal Left main, 20 % stenosis proximal LAD, 40% stenosis mid CFX, scattered irregularities RCA;  LVEF of 60% documented via echocardiogram on 01/21/2013,   ____________________________ CARDIO-PULMONARY TEST DATES EKG Date:  03/18/2013;   Cardiac Cath Date:  04/27/2011;  Echocardiography Date: 01/21/2013;  Chest Xray Date: 05/01/2011;   ____________________________ SOCIAL HISTORY Alcohol Use:  no alcohol use;  Smoking:  used to smoke but quit 1983 Prior to 1980;  Diet:  regular diet;  Lifestyle:  married;  Exercise:  no regular exercise;  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 Cardiovascular:  please review HPI Abdominal: abdominal pain and anorexia Musculoskeletal:  arthritis of the right shoulder Neurological:  memory deficit, vertigo, dizziness  ____________________________ PHYSICAL  EXAMINATION VITAL SIGNS   Blood Pressure:  138/70 Sitting, Right arm, regular cuff  , 126/64 Standing, Right arm and regular cuff   Pulse:  70/min. Weight:  156.00 lbs. Height:  62"BMI: 28  Constitutional:  pleasant white female, in no acute distress Skin:  warm and dry to touch, no apparent skin lesions, or masses noted. Head:  hair loss from chemo Neck:  supple, without massess. No JVD, thyromegaly or carotid bruits. Carotid upstroke normal. Chest:  normal symmetry, clear to auscultation and percussion. Cardiac:  regular rhythm, normal S1 and S2, No S3 or S4, no murmurs, gallops or rubs detected. Peripheral Pulses:  the femoral,dorsalis pedis, and posterior tibial pulses are full and equal bilaterally with no bruits auscultated. Extremities & Back:  limitation of range of motion shoulder, no edema present Neurological:  no gross motor or sensory deficits noted, affect appropriate, oriented x3. ____________________________ MOST RECENT LIPID PANEL 06/25/12  CHOL TOTL 174 mg/dl, LDL 78 calc, HDL 32 mg/dl, TRIGLYCER 098 mg/dl and CHOL/HDL 5.5 (Calc) ____________________________ IMPRESSIONS/PLAN  1. Atrial fibrillation currently in sinus rhythm on EKG 2. 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 History of mild coronary artery disease 4. Hypertension currently controlled 5. Chronic renal failure with creatinine of 7 most recently and may require dialysis.  Recommendations:  She has really gone downhill and has recurrent lymphoma. She is currently off of anticoagulation which is not unreasonable considering that she is in sinus rhythm and has a lot of complications with chemotherapy and is now close to requiring dialysis. I will see her in followup in 6 months. Her EKG shows normal sinus rhythm with a right bundle branch block. At the present time, I would like her to stay off of the diltiazem. ____________________________ TODAYS ORDERS  1. 12 Lead EKG: Today  2. Return  Visit: 6 months                       ____________________________ Cardiology Physician:  Darden Palmer MD [facility]

## 2013-03-19 ENCOUNTER — Other Ambulatory Visit: Payer: Medicare Other | Admitting: Lab

## 2013-03-19 ENCOUNTER — Other Ambulatory Visit: Payer: Self-pay

## 2013-03-19 ENCOUNTER — Emergency Department (HOSPITAL_COMMUNITY)
Admission: EM | Admit: 2013-03-19 | Discharge: 2013-03-19 | Disposition: A | Discharge status: Home / Self Care | Payer: Medicare Other | Attending: Emergency Medicine | Admitting: Emergency Medicine

## 2013-03-19 ENCOUNTER — Ambulatory Visit: Payer: Medicare Other

## 2013-03-19 ENCOUNTER — Encounter (HOSPITAL_COMMUNITY)
Admission: RE | Admit: 2013-03-19 | Discharge: 2013-03-19 | Disposition: A | Discharge status: Home / Self Care | Payer: Medicare Other | Source: Ambulatory Visit | Attending: Medical | Admitting: Medical

## 2013-03-19 ENCOUNTER — Other Ambulatory Visit: Payer: Self-pay | Admitting: Hematology & Oncology

## 2013-03-19 ENCOUNTER — Emergency Department (HOSPITAL_COMMUNITY): Payer: Medicare Other

## 2013-03-19 DIAGNOSIS — I252 Old myocardial infarction: Secondary | ICD-10-CM | POA: Insufficient documentation

## 2013-03-19 DIAGNOSIS — I160 Hypertensive urgency: Secondary | ICD-10-CM

## 2013-03-19 DIAGNOSIS — Z8669 Personal history of other diseases of the nervous system and sense organs: Secondary | ICD-10-CM | POA: Insufficient documentation

## 2013-03-19 DIAGNOSIS — E1169 Type 2 diabetes mellitus with other specified complication: Secondary | ICD-10-CM | POA: Insufficient documentation

## 2013-03-19 DIAGNOSIS — Z79899 Other long term (current) drug therapy: Secondary | ICD-10-CM | POA: Insufficient documentation

## 2013-03-19 DIAGNOSIS — C8589 Other specified types of non-Hodgkin lymphoma, extranodal and solid organ sites: Secondary | ICD-10-CM | POA: Insufficient documentation

## 2013-03-19 DIAGNOSIS — Z8659 Personal history of other mental and behavioral disorders: Secondary | ICD-10-CM | POA: Insufficient documentation

## 2013-03-19 DIAGNOSIS — R0789 Other chest pain: Secondary | ICD-10-CM | POA: Insufficient documentation

## 2013-03-19 DIAGNOSIS — C8330 Diffuse large B-cell lymphoma, unspecified site: Secondary | ICD-10-CM

## 2013-03-19 DIAGNOSIS — I129 Hypertensive chronic kidney disease with stage 1 through stage 4 chronic kidney disease, or unspecified chronic kidney disease: Secondary | ICD-10-CM | POA: Insufficient documentation

## 2013-03-19 DIAGNOSIS — Z8679 Personal history of other diseases of the circulatory system: Secondary | ICD-10-CM | POA: Insufficient documentation

## 2013-03-19 DIAGNOSIS — R011 Cardiac murmur, unspecified: Secondary | ICD-10-CM | POA: Insufficient documentation

## 2013-03-19 DIAGNOSIS — Z862 Personal history of diseases of the blood and blood-forming organs and certain disorders involving the immune mechanism: Secondary | ICD-10-CM | POA: Insufficient documentation

## 2013-03-19 DIAGNOSIS — C801 Malignant (primary) neoplasm, unspecified: Secondary | ICD-10-CM

## 2013-03-19 DIAGNOSIS — N189 Chronic kidney disease, unspecified: Secondary | ICD-10-CM | POA: Insufficient documentation

## 2013-03-19 DIAGNOSIS — Z8739 Personal history of other diseases of the musculoskeletal system and connective tissue: Secondary | ICD-10-CM | POA: Insufficient documentation

## 2013-03-19 DIAGNOSIS — E162 Hypoglycemia, unspecified: Secondary | ICD-10-CM

## 2013-03-19 DIAGNOSIS — Z9889 Other specified postprocedural states: Secondary | ICD-10-CM | POA: Insufficient documentation

## 2013-03-19 LAB — BASIC METABOLIC PANEL
Chloride: 96 mEq/L (ref 96–112)
GFR calc Af Amer: 7 mL/min — ABNORMAL LOW (ref >90)
Potassium: 3.4 mEq/L — ABNORMAL LOW (ref 3.5–5.1)

## 2013-03-19 LAB — URINALYSIS, ROUTINE W REFLEX MICROSCOPIC
Leukocytes, UA: NEGATIVE
Nitrite: NEGATIVE
Specific Gravity, Urine: 1.011 (ref 1.005–1.030)
pH: 7 (ref 5.0–8.0)

## 2013-03-19 LAB — CBC WITH DIFFERENTIAL/PLATELET
Basophils Absolute: 0 10*3/uL (ref 0.0–0.1)
Basophils Relative: 0 % (ref 0–1)
Hemoglobin: 9 g/dL — ABNORMAL LOW (ref 12.0–15.0)
MCHC: 34.1 g/dL (ref 30.0–36.0)
Neutro Abs: 10.4 10*3/uL — ABNORMAL HIGH (ref 1.7–7.7)
Neutrophils Relative %: 65 % (ref 43–77)
RDW: 20.3 % — ABNORMAL HIGH (ref 11.5–15.5)

## 2013-03-19 LAB — TYPE AND SCREEN
ABO/RH(D): A POS
Antibody Screen: NEGATIVE

## 2013-03-19 LAB — URINE MICROSCOPIC-ADD ON

## 2013-03-19 LAB — GLUCOSE, CAPILLARY
Glucose-Capillary: 30 mg/dL — CL (ref 70–99)
Glucose-Capillary: 58 mg/dL — ABNORMAL LOW (ref 70–99)
Glucose-Capillary: 177 mg/dL — ABNORMAL HIGH (ref 70–99)
Glucose-Capillary: 181 mg/dL — ABNORMAL HIGH (ref 70–99)

## 2013-03-19 LAB — PREPARE RBC (CROSSMATCH)

## 2013-03-19 MED ORDER — ASPIRIN 81 MG PO CHEW
162.0000 mg | CHEWABLE_TABLET | Freq: Once | ORAL | Status: AC
  Administered 2013-03-19: 162 mg via ORAL
  Filled 2013-03-19: qty 2

## 2013-03-19 MED ORDER — GLUCOSE 40 % PO GEL
ORAL | Status: AC
  Filled 2013-03-19: qty 1

## 2013-03-19 MED ORDER — SODIUM CHLORIDE 0.9 % IV BOLUS (SEPSIS)
1000.0000 mL | Freq: Once | INTRAVENOUS | Status: AC
  Administered 2013-03-19: 1000 mL via INTRAVENOUS

## 2013-03-19 MED ORDER — DEXTROSE 50 % IV SOLN
INTRAVENOUS | Status: AC
  Administered 2013-03-19: 50 mL
  Filled 2013-03-19: qty 50

## 2013-03-19 MED ORDER — GLUCOSE 40 % PO GEL
1.0000 | Freq: Once | ORAL | Status: AC
  Administered 2013-03-19: 37.5 g via ORAL

## 2013-03-19 NOTE — ED Notes (Addendum)
Pt was getting PET scan done, reported feeling "like she was drunk and dizzy" this morning. PET staff checked CBG it was less than 50, checked after isotope giving, CBG 30. Rapid response called. Gave the dextrose gel PO. And brought pt to ED. In ED CBG 58, dextrose 50% given. Also c/o chest pain that started estimated 20 min ago.   Any questions call Channing Mutters [phone]

## 2013-03-19 NOTE — Progress Notes (Signed)
No blood transfusion here.  Received word that patient was sent to ED from her PET scan due to low blood sugar and chest pain.

## 2013-03-19 NOTE — ED Notes (Signed)
md at bedside

## 2013-03-19 NOTE — ED Notes (Signed)
Pt AAO x4, denies any itching or back pain. Vitals stable, pt ready for discharge.

## 2013-03-19 NOTE — ED Notes (Signed)
NWG:NFAO<ZH> Expected date:<BR> Expected time:<BR> Means of arrival:<BR> Comments:<BR> [name], [name], [birth year]

## 2013-03-19 NOTE — ED Provider Notes (Signed)
History     CSN: [id number]  Arrival date & time 03/19/13  1057   First MD Initiated Contact with Patient 03/19/13 1103      Chief Complaint  Patient presents with  . cancer pt, hypoglycemia, sent from radiology   . Chest Pain    (Consider location/radiation/quality/duration/timing/severity/associated sxs/prior treatment) HPI Comments: Patient is a 72 yo F PMHx significant for active large cell lymphoma, anginal pain, HTN, DM presenting to ED with central chest pain and low blood sugar. Pt is off of her diabetes meds. States that she was npo, and came in for a PET scan, she started feeling jittery, and then her blood sugar dropped. Pt's blood sugar was as low as in the 30s. Pt states that she also experience some central chest pain, pressure lie during this episode, which has since resolved. She was seen for chest pain recently and discharge. She has hx of moderately obstructive CAD. Pt had a negative v/q scan for PE evaluation lately, and was taken off of pradaxa that she was taking for afib.   The history is provided by the patient.    Past Medical History  Diagnosis Date  . Diabetes mellitus without complication   . Hypertension   . Anginal pain   . Headache   . Anxiety     h/o anxiety attacks  . Myocardial infarction 2011  . Cancer     lymphoma  . Arthritis     Left hip  . Anemia of renal disease 09/17/2012  . Bleeding     Past Surgical History  Procedure Laterality Date  . Abdominal hysterectomy      30 yrs. ago  . Appendectomy  2009  . Tonsillectomy      age 34  . Cardiac catheterization  2011  . Rotator cuff repair      Right arm  . Esophagogastroduodenoscopy (egd) with propofol  10/06/2012    Procedure: ESOPHAGOGASTRODUODENOSCOPY (EGD) WITH PROPOFOL;  Surgeon: Willis Modena, MD;  Location: [facility];  Service: Endoscopy;  Laterality: N/A;  . Colonoscopy with propofol  10/06/2012    Procedure: COLONOSCOPY WITH PROPOFOL;  Surgeon: Willis Modena, MD;   Location: [facility];  Service: Endoscopy;  Laterality: N/A;  . Colonoscopy with propofol N/A 01/14/2013    Procedure: COLONOSCOPY WITH PROPOFOL;  Surgeon: Willis Modena, MD;  Location: [facility];  Service: Endoscopy;  Laterality: N/A;  . Portacath placement Right   . Portacath placement Right 01/22/2013    No family history on file.  History  Substance Use Topics  . Smoking status: Former Smoker -- 0.25 packs/day for 15 years  . Smokeless tobacco: Never Used  . Alcohol Use: No    OB History   Grav Para Term Preterm Abortions TAB SAB Ect Mult Living                  Review of Systems  Constitutional: Negative for activity change.  HENT: Negative for facial swelling and neck pain.   Respiratory: Negative for cough, shortness of breath and wheezing.   Cardiovascular: Positive for chest pain.  Gastrointestinal: Negative for nausea, vomiting, abdominal pain, diarrhea, constipation, blood in stool and abdominal distention.  Genitourinary: Negative for hematuria and difficulty urinating.  Skin: Negative for color change.  Neurological: Negative for speech difficulty.  Hematological: Does not bruise/bleed easily.  Psychiatric/Behavioral: Negative for confusion.    Allergies  Advicor; Crestor; Lipitor; Lovastatin; Morphine and related; Niaspan; Nsaids; Pravachol; Sulfa antibiotics; Vytorin; Welchol; Zetia; and Zocor  Home Medications  Current Outpatient Rx  Name  Route  Sig  Dispense  Refill  . cholecalciferol (VITAMIN D) 1000 UNITS tablet   Oral   Take 2,000 Units by mouth daily.           . cloNIDine (CATAPRES) 0.1 MG tablet   Oral   Take 0.1 mg by mouth every morning.         . diltiazem (CARDIZEM) 60 MG tablet   Oral   Take 60 mg by mouth 4 (four) times daily.         Marland Kitchen docusate sodium 100 MG CAPS   Oral   Take 200 mg by mouth 3 (three) times daily as needed for constipation.   60 capsule   2   . esomeprazole (NEXIUM) 40 MG capsule   Oral   Take  40 mg by mouth daily.           Marland Kitchen lidocaine-prilocaine (EMLA) cream   Topical   Apply 1 application topically as needed (for port-a-cath access).         . metoprolol (LOPRESSOR) 50 MG tablet   Oral   Take 50 mg by mouth 2 (two) times daily.         . ondansetron (ZOFRAN-ODT) 8 MG disintegrating tablet   Oral   Take 8 mg by mouth every 8 (eight) hours as needed for nausea.          Marland Kitchen oxyCODONE-acetaminophen (PERCOCET/ROXICET) 5-325 MG per tablet   Oral   Take 1-2 tablets by mouth every 8 (eight) hours as needed for pain.   15 tablet   0   . prochlorperazine (COMPAZINE) 10 MG tablet   Oral   Take 10 mg by mouth every 8 (eight) hours as needed (for nausea/vomiting.).          Marland Kitchen traMADol (ULTRAM) 50 MG tablet   Oral   Take 50-100 mg by mouth every 6 (six) hours as needed for pain.            BP 184/89  Pulse 56  Resp 18  SpO2 100%  Physical Exam  Constitutional: She is oriented to person, place, and time. She appears well-developed and well-nourished.  HENT:  Head: Normocephalic and atraumatic.  Eyes: EOM are normal. Pupils are equal, round, and reactive to light.  Neck: Neck supple.  Cardiovascular: Normal rate and regular rhythm.   Murmur heard. Pulmonary/Chest: Effort normal. No respiratory distress.  Abdominal: Soft. She exhibits no distension. There is no tenderness. There is no rebound and no guarding.  Musculoskeletal: She exhibits no edema and no tenderness.  Neurological: She is alert and oriented to person, place, and time.  Skin: Skin is warm and dry.    ED Course  Procedures (including critical care time)  Labs Reviewed  GLUCOSE, CAPILLARY - Abnormal; Notable for the following:    Glucose-Capillary 58 (*)    All other components within normal limits  GLUCOSE, CAPILLARY - Abnormal; Notable for the following:    Glucose-Capillary 181 (*)    All other components within normal limits  URINE CULTURE  CBC WITH DIFFERENTIAL  BASIC METABOLIC  PANEL  URINALYSIS, ROUTINE W REFLEX MICROSCOPIC   No results found.   No diagnosis found.    MDM   Date: 03/19/2013  Rate: 56  Rhythm: normal sinus rhythm  QRS Axis: normal  Intervals: normal  ST/T Wave abnormalities: nonspecific ST/T changes  Conduction Disutrbances:none  Narrative Interpretation:   Old EKG Reviewed: unchanged  Differential diagnosis includes: ACS  syndrome CHF exacerbation Pericarditis Pericardial effusion Pneumonia Pleural effusion Pulmonary edema PE Anemia Musculoskeletal pain Insulinoma Medication side effects Liver cirrhosis  Pt comes in with cc of chest pain. Low sugar. Chest pain - low PE suspicion, neg VQ scan recently. Pt has moderate CAD per cath from few years ago. Was just seen by Dr. Donnie Aho yday. I spoke with him today- to see if patient is candidate for admission and provocative testing. He mentions that patient is not a candidate for an invasive procedures - she needs medical management only if she has ACS syndrome (Pt has CKD, cant get dye, and not strong enough for CABG) .  Derwood Kaplan, MD 03/19/13 1354

## 2013-03-19 NOTE — ED Notes (Signed)
[place] from [facility] [id number], he said pt is radioactive till 1215.

## 2013-03-19 NOTE — Telephone Encounter (Signed)
Chart opened in error

## 2013-03-19 NOTE — ED Notes (Signed)
Called nuc med, pt can urinate and staff can collect urine and send to lab.

## 2013-03-20 ENCOUNTER — Telehealth: Payer: Self-pay | Admitting: Hematology & Oncology

## 2013-03-20 LAB — URINE CULTURE

## 2013-03-20 LAB — TYPE AND SCREEN: Antibody Screen: NEGATIVE

## 2013-03-20 NOTE — Telephone Encounter (Signed)
Pt aware of 5-28 PET, MD and to be NPO 6 hrs. 5-29 PET cx

## 2013-03-24 ENCOUNTER — Encounter (HOSPITAL_COMMUNITY): Payer: Medicare Other

## 2013-03-25 ENCOUNTER — Ambulatory Visit (HOSPITAL_BASED_OUTPATIENT_CLINIC_OR_DEPARTMENT_OTHER): Payer: Medicare Other | Admitting: Hematology & Oncology

## 2013-03-25 ENCOUNTER — Ambulatory Visit (HOSPITAL_COMMUNITY)
Admission: RE | Admit: 2013-03-25 | Discharge: 2013-03-25 | Disposition: A | Discharge status: Home / Self Care | Payer: Medicare Other | Source: Ambulatory Visit | Attending: Hematology & Oncology | Admitting: Hematology & Oncology

## 2013-03-25 ENCOUNTER — Other Ambulatory Visit (HOSPITAL_BASED_OUTPATIENT_CLINIC_OR_DEPARTMENT_OTHER): Payer: Medicare Other | Admitting: Lab

## 2013-03-25 ENCOUNTER — Encounter (HOSPITAL_COMMUNITY)
Admit: 2013-03-25 | Discharge: 2013-03-25 | Disposition: A | Discharge status: Home / Self Care | Payer: Medicare Other | Attending: Medical | Admitting: Medical

## 2013-03-25 VITALS — BP 186/65 | HR 50 | Temp 97.9°F | Ht 62.0 in | Wt 150.0 lb

## 2013-03-25 DIAGNOSIS — C8589 Other specified types of non-Hodgkin lymphoma, extranodal and solid organ sites: Secondary | ICD-10-CM | POA: Insufficient documentation

## 2013-03-25 DIAGNOSIS — R221 Localized swelling, mass and lump, neck: Secondary | ICD-10-CM | POA: Insufficient documentation

## 2013-03-25 DIAGNOSIS — C8330 Diffuse large B-cell lymphoma, unspecified site: Secondary | ICD-10-CM

## 2013-03-25 DIAGNOSIS — I6529 Occlusion and stenosis of unspecified carotid artery: Secondary | ICD-10-CM | POA: Insufficient documentation

## 2013-03-25 DIAGNOSIS — R220 Localized swelling, mass and lump, head: Secondary | ICD-10-CM | POA: Insufficient documentation

## 2013-03-25 DIAGNOSIS — N179 Acute kidney failure, unspecified: Secondary | ICD-10-CM

## 2013-03-25 DIAGNOSIS — I658 Occlusion and stenosis of other precerebral arteries: Secondary | ICD-10-CM | POA: Insufficient documentation

## 2013-03-25 LAB — CMP (CANCER CENTER ONLY)
ALT(SGPT): 12 U/L (ref 10–47)
Alkaline Phosphatase: 72 U/L (ref 26–84)
Creat: 5.1 mg/dl (ref 0.6–1.2)
Sodium: 140 mEq/L (ref 128–145)
Total Bilirubin: 0.6 mg/dl (ref 0.20–1.60)
Total Protein: 6.9 g/dL (ref 6.4–8.1)

## 2013-03-25 LAB — CBC WITH DIFFERENTIAL (CANCER CENTER ONLY)
BASO%: 0.2 % (ref 0.0–2.0)
EOS%: 3.7 % (ref 0.0–7.0)
Eosinophils Absolute: 0.3 10*3/uL (ref 0.0–0.5)
LYMPH%: 35.8 % (ref 14.0–48.0)
MCH: 28.8 pg (ref 26.0–34.0)
MCHC: 33.3 g/dL (ref 32.0–36.0)
MONO%: 12.1 % (ref 0.0–13.0)
NEUT#: 4.5 10*3/uL (ref 1.5–6.5)
Platelets: 272 10*3/uL (ref 145–400)
RBC: 3.99 10*6/uL (ref 3.70–5.32)
RDW: 19.7 % — ABNORMAL HIGH (ref 11.1–15.7)

## 2013-03-25 LAB — CHCC SATELLITE - SMEAR

## 2013-03-25 MED ORDER — FLUDEOXYGLUCOSE F - 18 (FDG) INJECTION
18.9000 | Freq: Once | INTRAVENOUS | Status: AC | PRN
  Administered 2013-03-25: 18.9 via INTRAVENOUS

## 2013-03-25 NOTE — Progress Notes (Signed)
This office note has been dictated.

## 2013-03-26 ENCOUNTER — Encounter (HOSPITAL_COMMUNITY): Payer: Medicare Other

## 2013-03-27 NOTE — Progress Notes (Signed)
CC:   [name], M.D. [facility] Fax [phone]  DIAGNOSES: 1. Recurrent diffuse large cell non-Hodgkin's lymphoma. 2. Acute renal failure secondary to chemotherapy.  CURRENT THERAPY:  Observation.  INTERVAL HISTORY:  Ms. Mcgough comes in for follow-up.  She has been having some issues since we last saw her.  She actually was going for a PET scan last week and became profoundly hypoglycemic and this is quite unusual for her.  We actually did get a PET scan on her today.  Surprisingly enough, the PET scan showed that she had resolution of her abdominal lymphoma; this, I am very pleased with.  The 1 problem that we have however, is that she has progressive swelling of the right supraorbital ridge.  We have known about this in the past. We gave her chemotherapy back in April, this swelling had improved quite nicely.  She is having nausea.  I am not sure as to why this has been persistent for her; it may be part of her renal failure.  She has had no real abdominal pain right now.  Her memory is not that good.  She does have some short-term memory loss.  She really had an incredibly tough time with chemotherapy.  I was just surprised that she did have such a tough time with chemotherapy.  She was hospitalized for I think over 3 weeks because of complications from the chemotherapy.  However, the chemotherapy worked incredibly well. Again, she had a resolution of her activity within the abdomen and pelvis.  PHYSICAL EXAMINATION:  General:  This is a somewhat chronically ill- appearing white female in no obvious distress.  Vital signs: Temperature 97.9, pulse 50, respiratory rate 16, blood pressure 186/65, weight is 150.  Head/Neck:  Shows  normocephalic, atraumatic skull.  She does have swelling about the right supraorbital ridge; this is quite firm, it is slightly tender to palpation, it is not erythematous.  She has good extraocular muscle movement.  Pupils react  appropriately. There are no intraoral lesions.  She has no adenopathy in her neck. Lungs:  Clear bilaterally.  She has no rales, wheezes or rhonchi. Cardiac:  Regular rate and rhythm with a normal S1 and S2.  There are no murmurs, rubs or bruits.  Abdomen:  Soft, with good bowel sounds.  There is no palpable abdominal mass.  There is no fluid wave.  There is no palpable hepatosplenomegaly.  Extremities:  Show no clubbing, cyanosis or edema.  Neurologic:  Shows no focal neurological deficits.  Skin: Shows some slightly dry skin.  LABORATORY STUDIES:  White cell count 9.2, hemoglobin 11.5, hematocrit 34.5, platelet count 272.  Sodium 140, potassium 3.2, BUN 42, creatinine 5.1.  Calcium 9.0 with an albumin of 3.2.  IMPRESSION: 1. Ms. Glauser is a nice 72 year old white female with relapsed large     cell non-Hodgkin's lymphoma.  She responded to salvage therapy.     She could only tolerate 1 cycle of chemotherapy, R-ICE (Rituximab,     ifosfamide, carboplatin, etoposide).  I even gave her a dose-     reduced amount. 2. Again, this right supraorbital swelling is a little concerning.  I     suspect that this is lymphoma.  The PET scan that we did only went     up to her neck. 3. I will speak with Radiation Oncology.  I think this area would be     amenable to radiation.  One would think that since it responded  well to chemotherapy, that radiation would also help. 4. I want to see Ms.  Viti back in another couple of weeks or so. 5. I want to make sure that we continue to follow her laboratory work,     so that we can make adjustments as necessary. 6. Ms. Searson was with [place] for about an hour today.    ______________________________ Josph Macho, M.D. PRE/MEDQ  D:  03/25/2013  T:  03/26/2013  Job:  [id number]

## 2013-03-31 ENCOUNTER — Encounter: Payer: Self-pay | Admitting: *Deleted

## 2013-03-31 NOTE — Progress Notes (Signed)
Lymphoma Location(s) / Histology: 12/05/09 bone marrow  Biopsy- hypercellular marrow w/B cell lymphoproliferative process consistent w/ NHL, B cell, 12/14/09  right cervical lymph node- NHL, large B cell,  01/14/13 small intestine- high grade NHL  Patient presented 11/2009 with symptoms of: weakness, admitted to hospital   Biopsies of small intestine , ileocecal mass(if applicable) revealed: high grade NHL, B cell, 01/14/13  Past/Anticipated interventions by medical oncology, if any: 2011 8 cycles R-CHOP chemotherapy, 01/23/13 received RICE chemotherapy in hospital  Weight changes, if any, over the past 6 months: loss of 23 lbs  Recurrent fevers, or drenching night sweats, if any: none  SAFETY ISSUES:  Prior radiation? no  Pacemaker/ICD? no  Possible current pregnancy? no  Is the patient on methotrexate? 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 Pt denies pain but states she wakes w/HA every day, relieved w/Tylenol today. Pt states appetite improving, is fatigued. She states she noticed a hard lump above her right eye 2-3 weeks ago, is not painful but has begun to interfere w/her peripheral vision. She states she had this lump when hospitalized in March and it "went away after receiving chemo in hospital."

## 2013-04-01 ENCOUNTER — Ambulatory Visit
Admission: RE | Admit: 2013-04-01 | Discharge: 2013-04-01 | Disposition: A | Discharge status: Home / Self Care | Payer: Medicare Other | Source: Ambulatory Visit | Attending: Radiation Oncology | Admitting: Radiation Oncology

## 2013-04-01 ENCOUNTER — Encounter: Payer: Self-pay | Admitting: *Deleted

## 2013-04-01 ENCOUNTER — Encounter: Payer: Self-pay | Admitting: Radiation Oncology

## 2013-04-01 VITALS — BP 192/77 | HR 52 | Temp 98.1°F | Resp 20 | Wt 151.7 lb

## 2013-04-01 DIAGNOSIS — I6529 Occlusion and stenosis of unspecified carotid artery: Secondary | ICD-10-CM | POA: Insufficient documentation

## 2013-04-01 DIAGNOSIS — F40240 Claustrophobia: Secondary | ICD-10-CM | POA: Insufficient documentation

## 2013-04-01 DIAGNOSIS — Z79899 Other long term (current) drug therapy: Secondary | ICD-10-CM | POA: Insufficient documentation

## 2013-04-01 DIAGNOSIS — R51 Headache: Secondary | ICD-10-CM | POA: Insufficient documentation

## 2013-04-01 DIAGNOSIS — R5381 Other malaise: Secondary | ICD-10-CM | POA: Insufficient documentation

## 2013-04-01 DIAGNOSIS — R220 Localized swelling, mass and lump, head: Secondary | ICD-10-CM | POA: Insufficient documentation

## 2013-04-01 DIAGNOSIS — C8330 Diffuse large B-cell lymphoma, unspecified site: Secondary | ICD-10-CM

## 2013-04-01 DIAGNOSIS — I252 Old myocardial infarction: Secondary | ICD-10-CM | POA: Insufficient documentation

## 2013-04-01 DIAGNOSIS — R5383 Other fatigue: Secondary | ICD-10-CM | POA: Insufficient documentation

## 2013-04-01 DIAGNOSIS — R221 Localized swelling, mass and lump, neck: Secondary | ICD-10-CM | POA: Insufficient documentation

## 2013-04-01 DIAGNOSIS — I10 Essential (primary) hypertension: Secondary | ICD-10-CM | POA: Insufficient documentation

## 2013-04-01 DIAGNOSIS — R609 Edema, unspecified: Secondary | ICD-10-CM | POA: Insufficient documentation

## 2013-04-01 DIAGNOSIS — C8589 Other specified types of non-Hodgkin lymphoma, extranodal and solid organ sites: Secondary | ICD-10-CM | POA: Insufficient documentation

## 2013-04-01 DIAGNOSIS — E119 Type 2 diabetes mellitus without complications: Secondary | ICD-10-CM | POA: Insufficient documentation

## 2013-04-01 DIAGNOSIS — I658 Occlusion and stenosis of other precerebral arteries: Secondary | ICD-10-CM | POA: Insufficient documentation

## 2013-04-01 HISTORY — DX: Panic disorder (episodic paroxysmal anxiety): F41.0

## 2013-04-01 HISTORY — DX: Claustrophobia: F40.240

## 2013-04-01 NOTE — Progress Notes (Signed)
Radiation Oncology         [phone] ________________________________  Initial outpatient Consultation  Name: [name] MRN: [id number]  Date: 04/01/2013  DOB: 11-Jan-1941  WU:JWJXB,JYNWG RUTH, MD  Josph Macho, MD   REFERRING PHYSICIAN: Josph Macho, MD  DIAGNOSIS: The encounter diagnosis was Large cell (diffuse) non-Hodgkin's lymphoma.  HISTORY OF PRESENT ILLNESS::[name] is a 72 y.o. female who is seen out of the courtesy of Dr. Arlan Organ for an opinion concerning radiation therapy as part of management of patient's non-Hodgkin's lymphoma.  The patient initially presented with abdominal disease. She was initially treated with chemotherapy but then relapsed. She was treated with salvage chemotherapy consisting of rituximab ifosfamide carboplatin and etoposide. Patient was only able to tolerate one cycle this additional chemotherapy. According to the husband the patient was noted to have a right supraorbital swelling which initially responded to her chemotherapy. More recently however this area as enlarged and become more significant. This is starting to cause visual problems for the patient. She's also started having some headaches. In light of these issues radiation therapy is been consulted for consideration of palliative treatment.   PREVIOUS RADIATION THERAPY: No  PAST MEDICAL HISTORY:  has a past medical history of Diabetes mellitus without complication; Hypertension; Anginal pain; Headache(784.0); Anxiety; Myocardial infarction (2011); Arthritis; Anemia of renal disease (09/17/2012); Bleeding; Blood transfusion without reported diagnosis; Cancer; Acute renal failure; Claustrophobia; and Panic attacks.    PAST SURGICAL HISTORY: Past Surgical History  Procedure Laterality Date  . Abdominal hysterectomy      30 yrs. ago  . Appendectomy  2009  . Tonsillectomy      age 27  . Cardiac catheterization  2011  . Rotator cuff repair      Right arm  .  Esophagogastroduodenoscopy (egd) with propofol  10/06/2012    Procedure: ESOPHAGOGASTRODUODENOSCOPY (EGD) WITH PROPOFOL;  Surgeon: Willis Modena, MD;  Location: [facility];  Service: Endoscopy;  Laterality: N/A;  . Colonoscopy with propofol  10/06/2012    Procedure: COLONOSCOPY WITH PROPOFOL;  Surgeon: Willis Modena, MD;  Location: [facility];  Service: Endoscopy;  Laterality: N/A;  . Colonoscopy with propofol N/A 01/14/2013    Procedure: COLONOSCOPY WITH PROPOFOL;  Surgeon: Willis Modena, MD;  Location: [facility];  Service: Endoscopy;  Laterality: N/A;  . Portacath placement Right   . Portacath placement Right 01/22/2013    FAMILY HISTORY: family history includes Alzheimer's disease in her sister and Cancer in her brother, mother, and sister.  SOCIAL HISTORY:  reports that she quit smoking about 49 years ago. She has never used smokeless tobacco. She reports that she does not drink alcohol or use illicit drugs.  ALLERGIES: Advicor; Crestor; Lipitor; Lovastatin; Morphine and related; Niaspan; Nsaids; Pravachol; Sulfa antibiotics; Vytorin; Welchol; Zetia; and Zocor  MEDICATIONS:  Current Outpatient Prescriptions  Medication Sig Dispense Refill  . cholecalciferol (VITAMIN D) 1000 UNITS tablet Take 2,000 Units by mouth daily.        . cloNIDine (CATAPRES) 0.1 MG tablet Take 0.1 mg by mouth 2 (two) times daily.       Marland Kitchen docusate sodium 100 MG CAPS Take 200 mg by mouth 3 (three) times daily as needed for constipation.  60 capsule  2  . esomeprazole (NEXIUM) 40 MG capsule Take 40 mg by mouth daily.        Marland Kitchen lidocaine-prilocaine (EMLA) cream Apply 1 application topically as needed (for port-a-cath access).      . LORazepam (ATIVAN) 1 MG tablet Take  1 mg by mouth every 6 (six) hours as needed for anxiety.      . metoprolol (LOPRESSOR) 50 MG tablet Take 50 mg by mouth 2 (two) times daily.      . ondansetron (ZOFRAN-ODT) 8 MG disintegrating tablet Take 8 mg by mouth every 8 (eight) hours as  needed for nausea.       Marland Kitchen oxyCODONE-acetaminophen (PERCOCET/ROXICET) 5-325 MG per tablet Take 1 tablet by mouth every 8 (eight) hours as needed.       . prochlorperazine (COMPAZINE) 10 MG tablet Take 10 mg by mouth every 8 (eight) hours as needed.       . traMADol (ULTRAM) 50 MG tablet Take 50-100 mg by mouth every 6 (six) hours as needed for pain.       Marland Kitchen diltiazem (CARDIZEM) 60 MG tablet        No current facility-administered medications for this encounter.    REVIEW OF SYSTEMS:  A 15 point review of systems is documented in the electronic medical record. This was obtained by the nursing staff. However, I reviewed this with the patient to discuss relevant findings and make appropriate changes.  She has noticed some discomfort in the right supraorbital area. This does affect her peripheral vision on this side. She is also noticed some blurring of her vision out of the right eye. she denies any double vision.   She does have some chronic fatigue. She also admits to some memory problems since her diagnosis. She denies any abdominal pain or breathing problems.  She does feel this mass is causing some headaches.   PHYSICAL EXAM:  weight is 151 lb 11.2 oz (68.811 kg). Her oral temperature is 98.1 F (36.7 C). Her blood pressure is 192/77 and her pulse is 52. Her respiration is 20.   BP 192/77  Pulse 52  Temp(Src) 98.1 F (36.7 C) (Oral)  Resp 20  Wt 151 lb 11.2 oz (68.811 kg)  BMI 27.74 kg/m2  General Appearance:    Alert, cooperative, no distress, appears stated age, accompanied by her husband on evaluation today   Head:    Normocephalic, and the right supraorbital area the patient has a firm mass which almost feels the the consistency of bone. This measures approximately 4 x 3 half centimeters in size. This is fixed to the underlying bony anatomy. Patient does have some mild right orbital edema associated with this mass.   Eyes:    PERRL, conjunctiva/corneas clear, EOM's intact          Nose:   Nares normal, septum midline, mucosa normal, no drainage    or sinus tenderness  Throat:   Lips, mucosa, and tongue normal; gums normal, dentures in place   Neck:   Supple, symmetrical, trachea midline, no adenopathy;    thyroid:  no enlargement/tenderness/nodules; no carotid   bruit or JVD,  biopsy site from original diagnosis is noted in the right posterior neck   Back:     Symmetric, no curvature, ROM normal, no CVA tenderness  Lungs:     Clear to auscultation bilaterally, respirations unlabored  Chest Wall:    No tenderness or deformity   Heart:    Regular rate and rhythm, S1 and S2 normal,       Abdomen:     Soft, non-tender, bowel sounds active all four quadrants,    no masses, no organomegaly        Extremities:   Extremities normal, atraumatic, no cyanosis some edema noted in the ankle  and foot areas   Pulses:   2+ and symmetric all extremities  Skin:   Skin color, texture, turgor normal, no rashes or lesions  Lymph nodes:   Cervical, supraclavicular, and axillary nodes normal  Neurologic:   CNII-XII intact, normal strength, sensation and reflexes    throughout    LABORATORY DATA:  Lab Results  Component Value Date   WBC 9.2 03/25/2013   HGB 11.5* 03/25/2013   HCT 34.5* 03/25/2013   MCV 87 03/25/2013   PLT 272 03/25/2013   Lab Results  Component Value Date   NA 140 03/25/2013   K 3.2* 03/25/2013   CL 99 03/25/2013   CO2 28 03/25/2013   Lab Results  Component Value Date   ALT 10 03/05/2013   AST 17 03/25/2013   ALKPHOS 72 03/25/2013   BILITOT 0.60 03/25/2013     RADIOGRAPHY: Dg Chest 2 View  03/07/2013   *RADIOLOGY REPORT*  Clinical Data: Shortness of breath and left-sided chest pain. History of lymphoma.  CHEST - 2 VIEW  Comparison: 03/05/2013  Findings: Stable positioning of Port-A-Cath.  Lungs show low volumes with bibasilar atelectasis.  No focal infiltrate, pulmonary edema or pleural fluid is identified.  Heart size and mediastinal contours are within normal  limits.  Visualized bony structures are unremarkable.  IMPRESSION: No active disease.  Low lung volumes with bibasilar atelectasis.   Original Report Authenticated By: Irish Lack, M.D.   Nm Pulmonary Perf And Vent  03/07/2013   *RADIOLOGY REPORT*  Clinical Data: Chest pain. Shortness of breath.  Non-Hodgkins lymphoma.  NM PULMONARY VENTILATION AND PERFUSION SCAN  Radiopharmaceutical: CURIE MAA TECHNETIUM TO 61M ALBUMIN AGGREGATED ; 46.6 mCi technetium DTPA inhaled  Comparison: Multiple exams, including 03/07/2013  Findings: Date reduction and lower lobe activity on both perfusion and ventilation images ascribed to soft tissues of the chest wall. Minimal subsegmental notching of the left lung on the left anterior oblique view, likely corresponding to the major fissure region.  No worrisome perfusion defect identified.  IMPRESSION:  1. Very low probability (less than 10% risk) of acute pulmonary embolus.   Original Report Authenticated By: Gaylyn Rong, M.D.   Nm Pet Image Restag (ps) Skull Base To Thigh  03/25/2013   **ADDENDUM** CREATED: 03/25/2013 16:35:33  The patient is noted to have a hypermetabolic right periorbital lesion with SUV max = 6 similar to the previous exam.  This would be compatible with a lesion related to lymphoma and should be apparent on clinical inspection.  **END ADDENDUM** SIGNED BY: Minerva Areola A. Molli Posey, M.D.  03/25/2013   *RADIOLOGY REPORT*  Clinical Data:  Subsequent treatment strategy for non-Hodgkin's lymphoma base to.  NUCLEAR MEDICINE PET WHOLE BODY  Fasting Blood Glucose:  150  Technique:  18.9 mCi F-18 FDG was injected intravenously. CT data was obtained and used for attenuation correction and anatomic localization only.  (This was not acquired as a diagnostic CT examination.) Additional exam technical data entered on technologist worksheet.  Comparison:  12/26/2012  Findings:  Head/Neck:   No hypermetabolic lymph nodes in the neck.  Chest:   No hypermetabolic  mediastinal or hilar nodes.  No suspicious pulmonary nodules on the CT scan.  Small left pleural effusion is new in the interval and there is associated left base dependent collapse / consolidation.  Abdomen/Pelvis:  A new 4.8 x 6.9 cm cystic lesion occupies the lateral spleen and extends the capsule.  This lesion is photopenic on the PET images.  5 mm calcified stone identified in  the gallbladder.  Enlargement left adrenal gland is stable and shows no hypermetabolism.  Left renal cysts are noted in the upper pole.  There is a marked interval response to therapy.  The small bowel and cecal wall thickening seen previously has resolved.  The right mesenteric lymphadenopathy has improved substantially.  On the current study, the hypermetabolic lymph nodes seen previously are no longer evident although there are some small areas of soft tissue attenuation within the mesentery.  One of these seen on image 176 of series 2 measures 1.6 x 1.3 cm.  These regions show mild low-level F D G uptake with SUV max = 4.  Skeleton:  No focal hypermetabolic activity to suggest skeletal metastasis.  Extremities:  No hypermetabolic activity to suggest metastasis.  IMPRESSION: Marked interval response to therapy.  The small bowel and cecal wall thickening seen on the previous study is resolved.  No evidence for hypermetabolism associate with the bowel loops above background soft tissue/bowel uptake the hypermetabolic lymph nodes seen in the right abdominal mesentery have improved substantially. There are some ill-defined amorphous areas of soft tissue attenuation within the mesentery today and they show low level F D G uptake with SUV max = 4.  7 cm cystic lesion in the lateral spleen is photopenic on PET imaging.  This is new in the interval and may represent an evolving splenic infarct or splenic cyst/pseudocyst.  New small left pleural effusion with overlying the left lower lobe collapse / consolidation. There is some low-level F D G  uptake in the left posterior lung base and pleural region which is in the infectious/ inflammatory range.  Areas on follow-up imaging recommended.  Original Report Authenticated By: Kennith Center, M.D.   Dg Chest Port 1 View  03/19/2013   *RADIOLOGY REPORT*  Clinical Data: Chest pain  PORTABLE CHEST - 1 VIEW  Comparison: 03/07/2013  Findings: Right IJ power Port-A-Cath terminates over the distal superior vena cava.  Stable cardiomediastinal silhouette.  There is a focal opacity at the left lung base, with obscuration of the left hemidiaphragm.  No visible pleural effusion. Negative for pneumothorax.  The right lung is clear.  No acute osseous abnormality identified. Chronic foreshortening of the distal right clavicle may be postsurgical.  IMPRESSION:   Left basilar opacity.  This could reflect atelectasis or airspace disease.   Original Report Authenticated By: Britta Mccreedy, M.D.   Dg Abd Acute W/chest  03/05/2013   *RADIOLOGY REPORT*  Clinical Data: Abdominal pain  ACUTE ABDOMEN SERIES (ABDOMEN 2 VIEW & CHEST 1 VIEW)  Comparison: Chest radiograph 02/03/2013  Findings: Power port on the right with tip in distal SVC.  Normal cardiac silhouette.  Lungs are clear.  There is mild atelectasis at the left lung base.  No pneumothorax.  Left lateral decubitus view demonstrates no intraperitoneal free air.  No dilated loops of large or small bowel.  There is gas the rectum.  IMPRESSION:  1.  Left basilar atelectasis.  Otherwise no acute cardiopulmonary findings. 2.  No evidence of intraperitoneal free air or bowel obstruction.   Original Report Authenticated By: Genevive Bi, M.D.   Ct Maxillofacial Wo Cm  03/25/2013   *RADIOLOGY REPORT*  Clinical Data: Non-Hodgkins lymphoma.  Right supraorbital mass.  CT MAXILLOFACIAL WITHOUT CONTRAST  Technique:  Multidetector CT imaging of the maxillofacial structures was performed. Multiplanar CT image reconstructions were also generated.  Comparison: PET scan of the same date.   CT head of CT face with contrast 01/21/2013.  Findings: A  right supraorbital lateral soft tissue mass is stable is slightly larger than on the prior study.  It now measures 29 x 11 mm maximally.  There is no underlying osseous erosion.  Lesion may be contiguous with the right lacrimal gland.  The left lacrimal gland is within normal limits.  Globes and orbits are otherwise normal.  Limited imaging of the brain is unremarkable.  Previously noted right cervical lymph nodes have significantly decreased in size.  No new cervical adenopathy is evident. The paranasal sinuses are clear.  Atherosclerotic calcifications present within the cavernous carotid arteries bilaterally.  The paranasal sinuses and mastoid air cells are clear.  Degenerative changes are again noted in the upper cervical facet joints.  IMPRESSION:  1.  Stable to slight increase in size of a right supraorbital and lateral soft tissue mass remains concerning for lymphoma.  This was positive on the PET scan of the same day. 2.  Decreased conspicuity of cervical lymph nodes without evidence for residual or recurrent disease in the neck. 3.  Atherosclerosis.   Original Report Authenticated By: Marin Roberts, M.D.      IMPRESSION: Relapsed large cell non-Hodgkin's lymphoma. Patient is symptomatic from her mass in the right supra orbital area. She would be a good candidate for palliative radiation treatments directed to this area. I would anticipate approximately 3 weeks of radiation therapy directed at this region.  PLAN: Simulation and planning later this week.   I spent 60 minutes minutes face to face with the patient and more than 50% of that time was spent in counseling and/or coordination of care.   ------------------------------------------------  -----------------------------------  Billie Lade, PhD, MD

## 2013-04-01 NOTE — Progress Notes (Signed)
Please see the Nurse Progress Note in the MD Initial Consult Encounter for this patient. 

## 2013-04-02 ENCOUNTER — Encounter: Payer: Self-pay | Admitting: Radiation Oncology

## 2013-04-02 ENCOUNTER — Ambulatory Visit
Admission: RE | Admit: 2013-04-02 | Discharge: 2013-04-02 | Disposition: A | Discharge status: Home / Self Care | Payer: Medicare Other | Source: Ambulatory Visit | Attending: Radiation Oncology | Admitting: Radiation Oncology

## 2013-04-02 DIAGNOSIS — C8589 Other specified types of non-Hodgkin lymphoma, extranodal and solid organ sites: Secondary | ICD-10-CM | POA: Insufficient documentation

## 2013-04-02 DIAGNOSIS — H02849 Edema of unspecified eye, unspecified eyelid: Secondary | ICD-10-CM | POA: Insufficient documentation

## 2013-04-02 DIAGNOSIS — H5789 Other specified disorders of eye and adnexa: Secondary | ICD-10-CM | POA: Insufficient documentation

## 2013-04-02 DIAGNOSIS — Z510 Encounter for antineoplastic radiation therapy: Secondary | ICD-10-CM | POA: Insufficient documentation

## 2013-04-02 DIAGNOSIS — H579 Unspecified disorder of eye and adnexa: Secondary | ICD-10-CM | POA: Insufficient documentation

## 2013-04-02 DIAGNOSIS — C8330 Diffuse large B-cell lymphoma, unspecified site: Secondary | ICD-10-CM

## 2013-04-02 NOTE — Progress Notes (Signed)
Met with patient to discuss RO billing.  Patient had no concerns today.  Had checked [facility] website:  Patient has $1357.16 left in OOP expenses.

## 2013-04-06 NOTE — Progress Notes (Signed)
  Radiation Oncology         [phone] ________________________________  Name: HENA EWALT MRN: [id number]  Date: 04/02/2013  DOB: 1941/06/07  SIMULATION AND TREATMENT PLANNING NOTE  DIAGNOSIS:  Large cell (diffuse) non-Hodgkin's lymphoma   NARRATIVE:  The patient was brought to the CT Simulation planning suite.  Identity was confirmed.  All relevant records and images related to the planned course of therapy were reviewed.  The patient freely provided informed written consent to proceed with treatment after reviewing the details related to the planned course of therapy. The consent form was witnessed and verified by the simulation staff.  Then, the patient was set-up in a stable reproducible  supine position for radiation therapy.  CT images were obtained.  Surface markings were placed.  The CT images were loaded into the planning software.  Then the target and avoidance structures were contoured.  Treatment planning then occurred.  The radiation prescription was entered and confirmed.  Then, I designed and supervised the construction of a total of 1 medically necessary complex treatment devices.  I have requested : 3D Simulation  I have requested a DVH of the following structures: lens, optic nerve,optic chiasm.  I have ordered:dose calc.  PLAN:  The patient will receive 36 Gy in 20 fractions.  ________________________________  -----------------------------------  Billie Lade, PhD, MD

## 2013-04-09 ENCOUNTER — Ambulatory Visit: Payer: Medicare Other

## 2013-04-09 ENCOUNTER — Ambulatory Visit (HOSPITAL_BASED_OUTPATIENT_CLINIC_OR_DEPARTMENT_OTHER): Payer: Medicare Other | Admitting: Medical

## 2013-04-09 ENCOUNTER — Ambulatory Visit
Admission: RE | Admit: 2013-04-09 | Discharge: 2013-04-09 | Disposition: A | Discharge status: Home / Self Care | Payer: Medicare Other | Source: Ambulatory Visit | Attending: Radiation Oncology | Admitting: Radiation Oncology

## 2013-04-09 ENCOUNTER — Other Ambulatory Visit (HOSPITAL_BASED_OUTPATIENT_CLINIC_OR_DEPARTMENT_OTHER): Payer: Medicare Other | Admitting: Lab

## 2013-04-09 VITALS — BP 182/60 | HR 48 | Temp 97.5°F | Resp 16 | Ht 62.0 in | Wt 149.0 lb

## 2013-04-09 DIAGNOSIS — C8589 Other specified types of non-Hodgkin lymphoma, extranodal and solid organ sites: Secondary | ICD-10-CM

## 2013-04-09 DIAGNOSIS — C8330 Diffuse large B-cell lymphoma, unspecified site: Secondary | ICD-10-CM

## 2013-04-09 LAB — CMP (CANCER CENTER ONLY)
ALT(SGPT): 10 U/L (ref 10–47)
BUN, Bld: 40 mg/dL — ABNORMAL HIGH (ref 7–22)
CO2: 26 mEq/L (ref 18–33)
Calcium: 9 mg/dL (ref 8.0–10.3)
Chloride: 100 mEq/L (ref 98–108)
Creat: 5.3 mg/dl (ref 0.6–1.2)
Total Bilirubin: 0.6 mg/dl (ref 0.20–1.60)

## 2013-04-09 LAB — CBC WITH DIFFERENTIAL (CANCER CENTER ONLY)
BASO#: 0 10*3/uL (ref 0.0–0.2)
BASO%: 0.1 % (ref 0.0–2.0)
EOS%: 2 % (ref 0.0–7.0)
HCT: 30.6 % — ABNORMAL LOW (ref 34.8–46.6)
HGB: 10.3 g/dL — ABNORMAL LOW (ref 11.6–15.9)
LYMPH#: 4.7 10*3/uL — ABNORMAL HIGH (ref 0.9–3.3)
LYMPH%: 45.8 % (ref 14.0–48.0)
MCHC: 33.7 g/dL (ref 32.0–36.0)
MCV: 88 fL (ref 81–101)
MONO#: 0.9 10*3/uL (ref 0.1–0.9)
NEUT%: 43.1 % (ref 39.6–80.0)
RDW: 20 % — ABNORMAL HIGH (ref 11.1–15.7)

## 2013-04-09 NOTE — Progress Notes (Signed)
  Radiation Oncology         [phone] ________________________________  Name: [name] MRN: [id number]  Date: 04/09/2013  DOB: [birth year]  Simulation Verification Note  Status: outpatient  NARRATIVE: The patient was brought to the treatment unit and placed in the planned treatment position. The clinical setup was verified. Then port films were obtained and uploaded to the radiation oncology medical record software.  The treatment beams were carefully compared against the planned radiation fields. The position location and shape of the radiation fields was reviewed. They targeted volume of tissue appears to be appropriately covered by the radiation beams. Organs at risk appear to be excluded as planned.  Based on my personal review, I approved the simulation verification. The patient's treatment will proceed as planned.  -----------------------------------  Billie Lade, PhD, MD

## 2013-04-09 NOTE — Progress Notes (Signed)
DIAGNOSES: 1. Recurrent diffuse large cell non-Hodgkin's lymphoma. 2. Acute renal failure secondary to chemotherapy.  CURRENT THERAPY: Palliative Radiation to the right supra orbital area.  INTERVAL HISTORY:  [name] presents today for an office followup overall she reports that she's doing relatively well.  She starts her palliative radiation therapy to her right supraorbital area of her eye.  We did get a PET scan on her.  Surprisingly enough PET scan showed she had resolution of her abdominal lymphoma.  We are very pleased with this.  Again, she only had one cycle of chemotherapy with R.-ICE. Ufortunately, she had extremely poor tolerance to chemotherapy.  She still continues to have an elevated creatinine.  She's not reporting any abdominal pain.  She does have some intermittent nausea.  She is having some issues with constipation.  She is only taking a stool softener. I advised her to use a laxative such as MiraLax.  He denies fevers, chills or night sweats.  He denies any palpable adenopathy.  He denies any obvious or abnormal bleeding.  She denies any visual changes or rashes.  She does have some minimal lower extremity edema.  She does report some intermittent headaches, but utilizes Tylenol.  Her biggest issue right now is or orbital swelling that is limiting her vision.  Hopefully she will have the results from the radiation therapy.  Review of Systems: Constitutional:Negative for malaise/fatigue, fever, chills, weight loss, diaphoresis, activity change, appetite change, and unexpected weight change.  HEENT: Negative for double vision, blurred vision, visual loss, ear pain, tinnitus, congestion, rhinorrhea, epistaxis sore throat or sinus disease, oral pain/lesion, tongue soreness Respiratory: Negative for cough, chest tightness, shortness of breath, wheezing and stridor.  Cardiovascular: Negative for chest pain, palpitations, leg swelling, orthopnea, PND, DOE or  claudication Gastrointestinal: Negative for nausea, vomiting, abdominal pain, diarrhea, constipation, blood in stool, melena, hematochezia, abdominal distention, anal bleeding, rectal pain, anorexia and hematemesis.  Genitourinary: Negative for dysuria, frequency, hematuria,  Musculoskeletal: Negative for myalgias, back pain, joint swelling, arthralgias and gait problem.  Skin: Negative for rash, color change, pallor and wound.  Neurological:. Negative for dizziness/light-headedness, tremors, seizures, syncope, facial asymmetry, speech difficulty, weakness, numbness, headaches and paresthesias.  Hematological: Negative for adenopathy. Does not bruise/bleed easily.  Psychiatric/Behavioral:  Negative for depression, no loss of interest in normal activity or change in sleep pattern.   Physical Exam: This is a pleasant 72 year old well-developed well-nourished white female in no obvious distress Vitals: Temperature 97.5 degrees pulse 48 respirations 16 blood pressure 160 weight 149 pounds HEENT reveals a normocephalic, atraumatic skull, no scleral icterus, no oral lesions  Neck is supple without any cervical or supraclavicular adenopathy.  Lungs are clear to auscultation bilaterally. There are no wheezes, rales or rhonci Cardiac is regular rate and rhythm with a normal S1 and S2. There are no murmurs, rubs, or bruits.  Abdomen is soft with good bowel sounds, there is no palpable mass. There is no palpable hepatosplenomegaly. There is no palpable fluid wave.  Musculoskeletal no tenderness of the spine, ribs, or hips.  Extremities there are no clubbing, cyanosis, or edema.  Skin no petechia, purpura or ecchymosis Neurologic is nonfocal.  Laboratory Data: White count 10.3 hemoglobin 10.3 hematocrit 30.6 platelets 229,000  Current Outpatient Prescriptions on File Prior to Visit  Medication Sig Dispense Refill  . cholecalciferol (VITAMIN D) 1000 UNITS tablet Take 2,000 Units by mouth daily.         . cloNIDine (CATAPRES) 0.1 MG tablet Take 0.1 mg by mouth  2 (two) times daily.       Marland Kitchen diltiazem (CARDIZEM) 60 MG tablet       . docusate sodium 100 MG CAPS Take 200 mg by mouth 3 (three) times daily as needed for constipation.  60 capsule  2  . esomeprazole (NEXIUM) 40 MG capsule Take 40 mg by mouth daily.        Marland Kitchen lidocaine-prilocaine (EMLA) cream Apply 1 application topically as needed (for port-a-cath access).      . LORazepam (ATIVAN) 1 MG tablet Take 1 mg by mouth every 6 (six) hours as needed for anxiety.      . metoprolol (LOPRESSOR) 50 MG tablet Take 50 mg by mouth 2 (two) times daily.      . ondansetron (ZOFRAN-ODT) 8 MG disintegrating tablet Take 8 mg by mouth every 8 (eight) hours as needed for nausea.       Marland Kitchen oxyCODONE-acetaminophen (PERCOCET/ROXICET) 5-325 MG per tablet Take 1 tablet by mouth every 8 (eight) hours as needed.       . prochlorperazine (COMPAZINE) 10 MG tablet Take 10 mg by mouth every 8 (eight) hours as needed.       . traMADol (ULTRAM) 50 MG tablet Take 50-100 mg by mouth every 6 (six) hours as needed for pain.        No current facility-administered medications on file prior to visit.   Assessment/Plan: This is a pleasant 72 year old white female with the following issues:  #1.  Relapsed large cell non-Hodgkin's lymphoma.  She responded to salvage therapy.  She did only tolerate one cycle of chemotherapy, R.-ICE (rituximab, ifosfamide, carboplatin, etoposide).  This was also given dose reduce the amount.  Unfortunately, secondary to her poor tolerance we will are unable to continue with any chemotherapy treatment.  #2.  Right supraorbital swelling.  This is most likely lymphoma.  She is currently receiving palliative radiation therapy which will begin today.  #3.  Chronic renal failure.  She is following nephrologist.  #4.  Followup.  We will continue to follow up with [name] every few weeks to keep a close on her lab work.  We will see her back at that  time but before then should there be questions or concerns.

## 2013-04-10 ENCOUNTER — Ambulatory Visit
Admission: RE | Admit: 2013-04-10 | Discharge: 2013-04-10 | Disposition: A | Discharge status: Home / Self Care | Payer: Medicare Other | Source: Ambulatory Visit | Attending: Radiation Oncology | Admitting: Radiation Oncology

## 2013-04-13 ENCOUNTER — Ambulatory Visit
Admission: RE | Admit: 2013-04-13 | Discharge: 2013-04-13 | Disposition: A | Discharge status: Home / Self Care | Payer: Medicare Other | Source: Ambulatory Visit | Attending: Radiation Oncology | Admitting: Radiation Oncology

## 2013-04-14 ENCOUNTER — Emergency Department (HOSPITAL_COMMUNITY)
Admission: EM | Admit: 2013-04-14 | Discharge: 2013-04-15 | Disposition: A | Discharge status: Home / Self Care | Payer: Medicare Other | Attending: Emergency Medicine | Admitting: Emergency Medicine

## 2013-04-14 ENCOUNTER — Other Ambulatory Visit: Payer: Self-pay

## 2013-04-14 ENCOUNTER — Ambulatory Visit
Admission: RE | Admit: 2013-04-14 | Discharge: 2013-04-14 | Disposition: A | Discharge status: Home / Self Care | Payer: Medicare Other | Source: Ambulatory Visit | Attending: Radiation Oncology | Admitting: Radiation Oncology

## 2013-04-14 ENCOUNTER — Emergency Department (HOSPITAL_COMMUNITY): Payer: Medicare Other

## 2013-04-14 ENCOUNTER — Encounter: Payer: Self-pay | Admitting: Radiation Oncology

## 2013-04-14 ENCOUNTER — Encounter (HOSPITAL_COMMUNITY): Payer: Self-pay | Admitting: Emergency Medicine

## 2013-04-14 VITALS — BP 197/73 | HR 45 | Resp 18 | Wt 151.4 lb

## 2013-04-14 DIAGNOSIS — Z87891 Personal history of nicotine dependence: Secondary | ICD-10-CM | POA: Insufficient documentation

## 2013-04-14 DIAGNOSIS — R5383 Other fatigue: Secondary | ICD-10-CM | POA: Insufficient documentation

## 2013-04-14 DIAGNOSIS — Z862 Personal history of diseases of the blood and blood-forming organs and certain disorders involving the immune mechanism: Secondary | ICD-10-CM | POA: Insufficient documentation

## 2013-04-14 DIAGNOSIS — R5381 Other malaise: Secondary | ICD-10-CM | POA: Insufficient documentation

## 2013-04-14 DIAGNOSIS — C8589 Other specified types of non-Hodgkin lymphoma, extranodal and solid organ sites: Secondary | ICD-10-CM

## 2013-04-14 DIAGNOSIS — Z8739 Personal history of other diseases of the musculoskeletal system and connective tissue: Secondary | ICD-10-CM | POA: Insufficient documentation

## 2013-04-14 DIAGNOSIS — E119 Type 2 diabetes mellitus without complications: Secondary | ICD-10-CM | POA: Insufficient documentation

## 2013-04-14 DIAGNOSIS — Z8679 Personal history of other diseases of the circulatory system: Secondary | ICD-10-CM | POA: Insufficient documentation

## 2013-04-14 DIAGNOSIS — R51 Headache: Secondary | ICD-10-CM | POA: Insufficient documentation

## 2013-04-14 DIAGNOSIS — I10 Essential (primary) hypertension: Secondary | ICD-10-CM | POA: Insufficient documentation

## 2013-04-14 DIAGNOSIS — I252 Old myocardial infarction: Secondary | ICD-10-CM | POA: Insufficient documentation

## 2013-04-14 DIAGNOSIS — Z8659 Personal history of other mental and behavioral disorders: Secondary | ICD-10-CM | POA: Insufficient documentation

## 2013-04-14 DIAGNOSIS — Z79899 Other long term (current) drug therapy: Secondary | ICD-10-CM | POA: Insufficient documentation

## 2013-04-14 DIAGNOSIS — C8581 Other specified types of non-Hodgkin lymphoma, lymph nodes of head, face, and neck: Secondary | ICD-10-CM | POA: Insufficient documentation

## 2013-04-14 LAB — CBC WITH DIFFERENTIAL/PLATELET
Eosinophils Relative: 3 % (ref 0–5)
HCT: 28.7 % — ABNORMAL LOW (ref 36.0–46.0)
Hemoglobin: 9.8 g/dL — ABNORMAL LOW (ref 12.0–15.0)
Lymphocytes Relative: 51 % — ABNORMAL HIGH (ref 12–46)
Lymphs Abs: 4.8 10*3/uL — ABNORMAL HIGH (ref 0.7–4.0)
MCV: 85.2 fL (ref 78.0–100.0)
Monocytes Absolute: 0.8 10*3/uL (ref 0.1–1.0)
Monocytes Relative: 9 % (ref 3–12)
Neutro Abs: 3.5 10*3/uL (ref 1.7–7.7)
RDW: 20.2 % — ABNORMAL HIGH (ref 11.5–15.5)
WBC: 9.4 10*3/uL (ref 4.0–10.5)

## 2013-04-14 LAB — TROPONIN I: Troponin I: <0.3 ng/mL (ref <0.30)

## 2013-04-14 LAB — COMPREHENSIVE METABOLIC PANEL
AST: 12 U/L (ref 0–37)
BUN: 42 mg/dL — ABNORMAL HIGH (ref 6–23)
CO2: 21 mEq/L (ref 19–32)
Calcium: 8.8 mg/dL (ref 8.4–10.5)
Chloride: 101 mEq/L (ref 96–112)
Creatinine, Ser: 4.99 mg/dL — ABNORMAL HIGH (ref 0.50–1.10)
GFR calc Af Amer: 9 mL/min — ABNORMAL LOW (ref >90)
GFR calc non Af Amer: 8 mL/min — ABNORMAL LOW (ref >90)
Glucose, Bld: 193 mg/dL — ABNORMAL HIGH (ref 70–99)
Total Bilirubin: 0.3 mg/dL (ref 0.3–1.2)

## 2013-04-14 MED ORDER — HYDRALAZINE HCL 20 MG/ML IJ SOLN
10.0000 mg | Freq: Once | INTRAMUSCULAR | Status: AC
  Administered 2013-04-14: 10 mg via INTRAVENOUS
  Filled 2013-04-14 (×2): qty 1

## 2013-04-14 MED ORDER — CLONIDINE HCL 0.1 MG PO TABS
0.1000 mg | ORAL_TABLET | Freq: Once | ORAL | Status: AC
  Administered 2013-04-14: 0.1 mg via ORAL
  Filled 2013-04-14: qty 1

## 2013-04-14 NOTE — ED Notes (Signed)
Patient transported to CT 

## 2013-04-14 NOTE — ED Notes (Addendum)
Presents with worsening headache. Patient states she did complete her treatment today. No visual changes today. No focal neuro deficits. Able to stand at bedside with some difficulty

## 2013-04-14 NOTE — Progress Notes (Signed)
[facility]    Radiation Oncology [address] [place]     Maryln Gottron, M.D. [place], [place] [address]               Billie Lade, M.D., Ph.D. Phone: [phone]      Molli Hazard A. Kathrynn Running, M.D. Fax: [phone]      Radene Gunning, M.D., Ph.D.         Lurline Hare, M.D.         Grayland Jack, M.D Weekly Treatment Management Note  Name: [name]     MRN: [id number]        CSN: [id number] Date: 04/14/2013      DOB: 11-16-1940  CC: Diana Espy, MD         Kevan Ny    Status: Outpatient  Diagnosis: The encounter diagnosis was Other malignant lymphomas, unspecified site, extranodal and solid organ sites.  Current Dose: 7.2 Gy  Current Fraction: 4  Planned Dose: 36 Gy  Narrative: [name] was seen today for weekly treatment management. The chart was checked and CBCT  were reviewed. She has noticed some itching of the right eye as well as some dryness since starting her therapy. I have asked the patient to use sensitive eyes saline drops or artificial tears.  She has already noticed some decrease in swelling in this area as well as better peripheral vision  Advicor; Crestor; Lipitor; Lovastatin; Morphine and related; Niaspan; Nsaids; Pravachol; Sulfa antibiotics; Vytorin; Welchol; Zetia; and Zocor Current Outpatient Prescriptions  Medication Sig Dispense Refill  . cholecalciferol (VITAMIN D) 1000 UNITS tablet Take 2,000 Units by mouth daily.        . cloNIDine (CATAPRES) 0.1 MG tablet Take 0.1 mg by mouth 2 (two) times daily.       Marland Kitchen docusate sodium 100 MG CAPS Take 200 mg by mouth 3 (three) times daily as needed for constipation.  60 capsule  2  . esomeprazole (NEXIUM) 40 MG capsule Take 40 mg by mouth daily.        Marland Kitchen lidocaine-prilocaine (EMLA) cream Apply 1 application topically as needed (for port-a-cath access).      . metoprolol (LOPRESSOR) 50 MG tablet Take 50 mg by mouth 2 (two) times daily.      . ondansetron (ZOFRAN-ODT) 8 MG  disintegrating tablet Take 8 mg by mouth every 8 (eight) hours as needed for nausea.       . prochlorperazine (COMPAZINE) 10 MG tablet Take 10 mg by mouth every 8 (eight) hours as needed.       . traMADol (ULTRAM) 50 MG tablet Take 50-100 mg by mouth every 6 (six) hours as needed for pain.        No current facility-administered medications for this encounter.   Labs:  Lab Results  Component Value Date   WBC 10.3* 04/09/2013   HGB 10.3* 04/09/2013   HCT 30.6* 04/09/2013   MCV 88 04/09/2013   PLT 229 04/09/2013   Lab Results  Component Value Date   CREATININE 5.3* 04/09/2013   BUN 40* 04/09/2013   NA 138 04/09/2013   K 4.1 04/09/2013   CL 100 04/09/2013   CO2 26 04/09/2013   Lab Results  Component Value Date   ALT 10 03/05/2013   AST 15 04/09/2013   PHOS 3.3 02/13/2013   BILITOT 0.60 04/09/2013    Physical Examination:  weight is 151 lb 6.4 oz (68.675 kg). Her blood pressure is 197/73 and her pulse is 45.  Her respiration is 18.    Wt Readings from Last 3 Encounters:  04/14/13 151 lb 6.4 oz (68.675 kg)  04/09/13 149 lb (67.586 kg)  04/01/13 151 lb 11.2 oz (68.811 kg)   The right supraorbital mass is unchanged on exam today.. There is no significant inflammation noted in the right eye.   Assessment:  Patient tolerating treatments well  Plan: Continue treatment per original radiation prescription

## 2013-04-14 NOTE — ED Notes (Signed)
PT. REPORTS ELEVATED BLOOD PRESSURE WHILE RECEIVING RADIATION THERAPY ( RIGHT EYE TUMOR) AT [place] TODAY , HYPERTENSIVE AT TRIAGE , DENIES PAIN OR DISCOMFORT , RESPIRATIONS UNLABORED.

## 2013-04-14 NOTE — Progress Notes (Signed)
Blood pressure elevated x2 weeks despite taking bp medication. Advised patient to follow up with PCP reference elevated bp. Reports right eye is dry and itchy. Edema of right eye is less than before and now she "can see a lot better." Wanted to recommend OTC moisturizing eye drops but, patient has numerous allergies will reference to physician recommendation.

## 2013-04-14 NOTE — ED Provider Notes (Signed)
History     CSN: [id number]  Arrival date & time 04/14/13  2055   First MD Initiated Contact with Patient 04/14/13 2148      Chief Complaint  Patient presents with  . Hypertension    (Consider location/radiation/quality/duration/timing/severity/associated sxs/prior treatment) HPI Comments: Patient presents from home with elevated blood pressure of 250/87. She received radiation today for lymphoma above her right eye. She checked her blood pressure home and found to still be elevated. She's had waxing and waning gradual onset headache for the past several days. She states compliance with her blood pressure medications she states that he cut back as chemotherapy made her have low blood pressure. She denies any vision change, nausea, vomiting, chest pain or shortness of breath. She states compliance with her medications.  The history is provided by the patient.    Past Medical History  Diagnosis Date  . Diabetes mellitus without complication   . Hypertension   . Anginal pain   . Headache(784.0)   . Anxiety     h/o anxiety attacks  . Myocardial infarction 2011  . Arthritis     Left hip  . Anemia of renal disease 09/17/2012  . Bleeding   . Blood transfusion without reported diagnosis     hx of  . Cancer     lymphoma, NHL B cell  . Acute renal failure   . Claustrophobia   . Panic attacks     Past Surgical History  Procedure Laterality Date  . Abdominal hysterectomy      30 yrs. ago  . Appendectomy  2009  . Tonsillectomy      age 57  . Cardiac catheterization  2011  . Rotator cuff repair      Right arm  . Esophagogastroduodenoscopy (egd) with propofol  10/06/2012    Procedure: ESOPHAGOGASTRODUODENOSCOPY (EGD) WITH PROPOFOL;  Surgeon: Willis Modena, MD;  Location: [facility];  Service: Endoscopy;  Laterality: N/A;  . Colonoscopy with propofol  10/06/2012    Procedure: COLONOSCOPY WITH PROPOFOL;  Surgeon: Willis Modena, MD;  Location: [facility];  Service: Endoscopy;   Laterality: N/A;  . Colonoscopy with propofol N/A 01/14/2013    Procedure: COLONOSCOPY WITH PROPOFOL;  Surgeon: Willis Modena, MD;  Location: [facility];  Service: Endoscopy;  Laterality: N/A;  . Portacath placement Right   . Portacath placement Right 01/22/2013    Family History  Problem Relation Age of Onset  . Cancer Mother     throat  . Alzheimer's disease Sister   . Cancer Brother     lymphoma  . Cancer Sister     unknown type    History  Substance Use Topics  . Smoking status: Former Smoker -- 0.25 packs/day for 15 years    Quit date: 04/01/1964  . Smokeless tobacco: Never Used  . Alcohol Use: No    OB History   Grav Para Term Preterm Abortions TAB SAB Ect Mult Living                  Review of Systems  Constitutional: Negative for fever, activity change and appetite change.  HENT: Negative for congestion and rhinorrhea.   Eyes: Negative for visual disturbance.  Respiratory: Negative for chest tightness and shortness of breath.   Cardiovascular: Negative for chest pain.  Gastrointestinal: Negative for nausea, vomiting and abdominal pain.  Genitourinary: Negative for dysuria, vaginal bleeding and vaginal discharge.  Musculoskeletal: Negative for back pain.  Skin: Negative for rash.  Neurological: Positive for weakness and headaches. Negative  for dizziness and light-headedness.  A complete 10 system review of systems was obtained and all systems are negative except as noted in the HPI and PMH.    Allergies  Advicor; Crestor; Lipitor; Lovastatin; Morphine and related; Niaspan; Nsaids; Pravachol; Sulfa antibiotics; Vytorin; Welchol; Zetia; and Zocor  Home Medications   Current Outpatient Rx  Name  Route  Sig  Dispense  Refill  . cholecalciferol (VITAMIN D) 1000 UNITS tablet   Oral   Take 2,000 Units by mouth daily.           . cloNIDine (CATAPRES) 0.1 MG tablet   Oral   Take 0.1 mg by mouth 2 (two) times daily.          Marland Kitchen docusate sodium 100 MG  CAPS   Oral   Take 200 mg by mouth 3 (three) times daily as needed for constipation.   60 capsule   2   . esomeprazole (NEXIUM) 40 MG capsule   Oral   Take 40 mg by mouth daily.           Marland Kitchen lidocaine-prilocaine (EMLA) cream   Topical   Apply 1 application topically as needed (for port-a-cath access).         . metoprolol (LOPRESSOR) 50 MG tablet   Oral   Take 50 mg by mouth 2 (two) times daily.         . ondansetron (ZOFRAN-ODT) 8 MG disintegrating tablet   Oral   Take 8 mg by mouth every 8 (eight) hours as needed for nausea.          . prochlorperazine (COMPAZINE) 10 MG tablet   Oral   Take 10 mg by mouth every 8 (eight) hours as needed (for nausea).          . traMADol (ULTRAM) 50 MG tablet   Oral   Take 50-100 mg by mouth every 6 (six) hours as needed for pain.            BP 201/68  Pulse 57  Temp(Src) 98.5 F (36.9 C) (Oral)  Resp 18  SpO2 99%  Physical Exam  Constitutional: She is oriented to person, place, and time. She appears well-developed and well-nourished. No distress.  HENT:  Head: Normocephalic and atraumatic.  Mouth/Throat: Oropharynx is clear and moist.   Periorbital swelling  Eyes: Conjunctivae and EOM are normal. Pupils are equal, round, and reactive to light.  Neck: Normal range of motion. Neck supple.  Cardiovascular: Normal rate, regular rhythm and normal heart sounds.   No murmur heard. Pulmonary/Chest: Effort normal and breath sounds normal. No respiratory distress.  Abdominal: Soft. There is no tenderness. There is no rebound and no guarding.  Musculoskeletal: Normal range of motion. She exhibits no edema and no tenderness.  Neurological: She is alert and oriented to person, place, and time. No cranial nerve deficit. She exhibits normal muscle tone. Coordination normal.  CN 2-12 intact, no ataxia on finger to nose, no nystagmus, 5/5 strength throughout, no pronator drift, Romberg negative, normal gait.   Skin: Skin is warm.     ED Course  Procedures (including critical care time)  Labs Reviewed  CBC WITH DIFFERENTIAL - Abnormal; Notable for the following:    RBC 3.37 (*)    Hemoglobin 9.8 (*)    HCT 28.7 (*)    RDW 20.2 (*)    Neutrophils Relative % 37 (*)    Lymphocytes Relative 51 (*)    Lymphs Abs 4.8 (*)    All other  components within normal limits  COMPREHENSIVE METABOLIC PANEL - Abnormal; Notable for the following:    Glucose, Bld 193 (*)    BUN 42 (*)    Creatinine, Ser 4.99 (*)    Albumin 3.4 (*)    GFR calc non Af Amer 8 (*)    GFR calc Af Amer 9 (*)    All other components within normal limits  TROPONIN I   Ct Head Wo Contrast  04/14/2013   *RADIOLOGY REPORT*  Clinical Data: Hypertension.  Evaluate for intracranial hemorrhage.  CT HEAD WITHOUT CONTRAST  Technique:  Contiguous axial images were obtained from the base of the skull through the vertex without contrast.  Comparison: Maxillofacial CT 03/25/2013.  Findings: Mild cerebral and cerebellar atrophy.  Multifocal patchy and confluent areas of decreased attenuation throughout the deep and periventricular white matter of the cerebral hemispheres bilaterally.  Ill defined areas of decreased attenuation throughout the basal ganglia bilaterally, favored to reflect old lacunar infarctions, although areas of acute or subacute ischemia in these regions are difficult to entirely exclude.  No evidence of acute intracranial hemorrhage.  No mass, mass effect, hydrocephalus or abnormal intra or extra-axial fluid collections.  Soft tissue mass in the right periorbital region, similar to the prior examination 03/17/2013, concerning for potential lymphoma.  No acute displaced skull fractures are identified.  Visualized paranasal sinuses and mastoids are well pneumatized.  IMPRESSION: 1.  Negative for acute intracranial hemorrhage. 2.  Cerebral and cerebellar atrophy with chronic microvascular ischemic changes in the white matter.  There are ill-defined areas of  decreased attenuation in the basal ganglia bilaterally, which are favored to reflect areas of old lacunar infarctions, however, given the ill-defined appearance, areas of acute/subacute cerebral ischemia in these regions cannot be excluded on today's head CT. This could be better delineated with MRI of the brain if clinically indicated. 3.  Right periorbital soft tissue mass unchanged, suspicious for potential lymphoma.   Original Report Authenticated By: Trudie Reed, M.D.     No diagnosis found.    MDM  Hypertension with gradual onset headache. No thunderclap onset. No recent medication adjustments. 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 Neuro exam is nonfocal.   No hemorrhage on CT. Creatinine actually improved from baseline.  BP improved to 177/56 with clonidine and hydralazline.  Headache resolved.  Patient anxious to go home. States she will call Dr. Kevan Ny in the morning to adjust her medications. Explained need for BP management and risks of damage to brains, kidneys, eyes.  BP 177/56  Pulse 58  Temp(Src) 98.5 F (36.9 C) (Oral)  Resp 16  SpO2 99%    Date: 04/15/2013  Rate: 72  Rhythm: normal sinus rhythm  QRS Axis: normal  Intervals: normal  ST/T Wave abnormalities: nonspecific ST/T changes  Conduction Disutrbances:right bundle branch block  Narrative Interpretation: inferior lateral ST depressions, unchanged  Old EKG Reviewed: unchanged    Glynn Octave, MD 04/15/13 0020

## 2013-04-15 ENCOUNTER — Ambulatory Visit
Admission: RE | Admit: 2013-04-15 | Discharge: 2013-04-15 | Disposition: A | Discharge status: Home / Self Care | Payer: Medicare Other | Source: Ambulatory Visit | Attending: Radiation Oncology | Admitting: Radiation Oncology

## 2013-04-15 NOTE — ED Notes (Signed)
Patient was very unhappy about the wait time.  Explained to her about the HTN.. Dr. Manus Gunning in to see. Patient and she agreed to have the hydralazine,

## 2013-04-16 ENCOUNTER — Ambulatory Visit
Admission: RE | Admit: 2013-04-16 | Discharge: 2013-04-16 | Disposition: A | Discharge status: Home / Self Care | Payer: Medicare Other | Source: Ambulatory Visit | Attending: Radiation Oncology | Admitting: Radiation Oncology

## 2013-04-17 ENCOUNTER — Ambulatory Visit
Admission: RE | Admit: 2013-04-17 | Discharge: 2013-04-17 | Disposition: A | Discharge status: Home / Self Care | Payer: Medicare Other | Source: Ambulatory Visit | Attending: Radiation Oncology | Admitting: Radiation Oncology

## 2013-04-20 ENCOUNTER — Ambulatory Visit
Admission: RE | Admit: 2013-04-20 | Discharge: 2013-04-20 | Disposition: A | Discharge status: Home / Self Care | Payer: Medicare Other | Source: Ambulatory Visit | Attending: Radiation Oncology | Admitting: Radiation Oncology

## 2013-04-20 ENCOUNTER — Other Ambulatory Visit: Payer: Self-pay | Admitting: Nephrology

## 2013-04-20 DIAGNOSIS — R7989 Other specified abnormal findings of blood chemistry: Secondary | ICD-10-CM

## 2013-04-21 ENCOUNTER — Ambulatory Visit
Admission: RE | Admit: 2013-04-21 | Discharge: 2013-04-21 | Disposition: A | Discharge status: Home / Self Care | Payer: Medicare Other | Source: Ambulatory Visit | Attending: Radiation Oncology | Admitting: Radiation Oncology

## 2013-04-21 DIAGNOSIS — C8589 Other specified types of non-Hodgkin lymphoma, extranodal and solid organ sites: Secondary | ICD-10-CM

## 2013-04-21 NOTE — Progress Notes (Signed)
Post sim ed completed w/pt and husband. Gave pt "Radiation and You" booklet w/all pertinent information marked and discussed, re: fatigue, skin irritation/care, pain, nutrition. Pt verbalized understanding. Pt denies pain, loss of appetite, vision problems or vision changes. She is fatigued, reports "a new knot" in front of her right ear x 1 week. This area is small and slightly raised, not red or mottled. She states it is not painful, tender. She states her right jaw is tender but feels it may be due to tight face mask she wears during tx.

## 2013-04-21 NOTE — Progress Notes (Signed)
[facility]    Radiation Oncology [address] [place]     Maryln Gottron, M.D. [place], [place] [address]               Billie Lade, M.D., Ph.D. Phone: [phone]      Molli Hazard A. Kathrynn Running, M.D. Fax: [phone]      Radene Gunning, M.D., Ph.D.         Lurline Hare, M.D.         Grayland Jack, M.D Weekly Treatment Management Note  Name: [name]     MRN: [id number]        CSN: [id number] Date: 04/21/2013      DOB: [birth year]  CC: [name], MD         Kevan Ny    Status: Outpatient  Diagnosis: The encounter diagnosis was Other malignant lymphomas, unspecified site, extranodal and solid organ sites.  Current Dose: 16.2 Gy  Current Fraction: 9  Planned Dose: 36 Gy  Narrative: [name] was seen today for weekly treatment management. The chart was checked and CBCT  were reviewed. She is tolerating her treatments well this week. She has been using saline eyedrops which have helped with her dryness.  Her peripheral vision on the right eye continues to improve.  Advicor; Crestor; Lipitor; Lovastatin; Morphine and related; Niaspan; Nsaids; Pravachol; Sulfa antibiotics; Vytorin; Welchol; Zetia; and Zocor Current Outpatient Prescriptions  Medication Sig Dispense Refill  . amLODipine (NORVASC) 2.5 MG tablet Take 2.5 mg by mouth daily.      . cholecalciferol (VITAMIN D) 1000 UNITS tablet Take 2,000 Units by mouth daily.        . cloNIDine (CATAPRES) 0.1 MG tablet Take 0.1 mg by mouth 2 (two) times daily.       Marland Kitchen docusate sodium 100 MG CAPS Take 200 mg by mouth 3 (three) times daily as needed for constipation.  60 capsule  2  . esomeprazole (NEXIUM) 40 MG capsule Take 40 mg by mouth daily.        Marland Kitchen lidocaine-prilocaine (EMLA) cream Apply 1 application topically as needed (for port-a-cath access).      . metoprolol (LOPRESSOR) 50 MG tablet Take 50 mg by mouth 2 (two) times daily.      . ondansetron (ZOFRAN-ODT) 8 MG disintegrating tablet Take 8 mg  by mouth every 8 (eight) hours as needed for nausea.       . prochlorperazine (COMPAZINE) 10 MG tablet Take 10 mg by mouth every 8 (eight) hours as needed (for nausea).       . traMADol (ULTRAM) 50 MG tablet Take 50-100 mg by mouth every 6 (six) hours as needed for pain.        No current facility-administered medications for this encounter.   Labs:  Lab Results  Component Value Date   WBC 9.4 04/14/2013   HGB 9.8* 04/14/2013   HCT 28.7* 04/14/2013   MCV 85.2 04/14/2013   PLT 243 04/14/2013   Lab Results  Component Value Date   CREATININE 4.99* 04/14/2013   BUN 42* 04/14/2013   NA 137 04/14/2013   K 3.6 04/14/2013   CL 101 04/14/2013   CO2 21 04/14/2013   Lab Results  Component Value Date   ALT 5 04/14/2013   AST 12 04/14/2013   PHOS 3.3 02/13/2013   BILITOT 0.3 04/14/2013    Physical Examination:  vitals were not taken for this visit.   Wt Readings from Last 3 Encounters:  04/14/13 151  lb 6.4 oz (68.675 kg)  04/09/13 149 lb (67.586 kg)  04/01/13 151 lb 11.2 oz (68.811 kg)    The right supraorbital mass appears to be smaller on exam today. Lungs - Normal respiratory effort, chest expands symmetrically. Lungs are clear to auscultation, no crackles or wheezes.  Heart has regular rhythm and rate  Abdomen is soft and non tender with normal bowel sounds  Assessment:  Patient tolerating treatments well  Plan: Continue treatment per original radiation prescription

## 2013-04-22 ENCOUNTER — Other Ambulatory Visit (HOSPITAL_BASED_OUTPATIENT_CLINIC_OR_DEPARTMENT_OTHER): Payer: Medicare Other | Admitting: Lab

## 2013-04-22 ENCOUNTER — Ambulatory Visit
Admission: RE | Admit: 2013-04-22 | Discharge: 2013-04-22 | Disposition: A | Discharge status: Home / Self Care | Payer: Medicare Other | Source: Ambulatory Visit | Attending: Radiation Oncology | Admitting: Radiation Oncology

## 2013-04-22 ENCOUNTER — Ambulatory Visit (HOSPITAL_BASED_OUTPATIENT_CLINIC_OR_DEPARTMENT_OTHER): Payer: Medicare Other | Admitting: Hematology & Oncology

## 2013-04-22 VITALS — BP 176/69 | HR 47 | Temp 97.4°F | Resp 16 | Ht 64.0 in | Wt 151.0 lb

## 2013-04-22 DIAGNOSIS — C8330 Diffuse large B-cell lymphoma, unspecified site: Secondary | ICD-10-CM

## 2013-04-22 DIAGNOSIS — C8589 Other specified types of non-Hodgkin lymphoma, extranodal and solid organ sites: Secondary | ICD-10-CM

## 2013-04-22 LAB — CBC WITH DIFFERENTIAL (CANCER CENTER ONLY)
BASO%: 0.3 % (ref 0.0–2.0)
LYMPH%: 61.2 % — ABNORMAL HIGH (ref 14.0–48.0)
MCV: 93 fL (ref 81–101)
MONO#: 0.7 10*3/uL (ref 0.1–0.9)
MONO%: 10.4 % (ref 0.0–13.0)
NEUT#: 1.8 10*3/uL (ref 1.5–6.5)
Platelets: 231 10*3/uL (ref 145–400)
RBC: 3.01 10*6/uL — ABNORMAL LOW (ref 3.70–5.32)
RDW: 19.1 % — ABNORMAL HIGH (ref 11.1–15.7)
WBC: 6.9 10*3/uL (ref 3.9–10.0)

## 2013-04-22 LAB — COMPREHENSIVE METABOLIC PANEL
ALT: <8 U/L (ref 0–35)
Albumin: 3.6 g/dL (ref 3.5–5.2)
Alkaline Phosphatase: 61 U/L (ref 39–117)
CO2: 23 mEq/L (ref 19–32)
Calcium: 8.9 mg/dL (ref 8.4–10.5)
Potassium: 4.8 mEq/L (ref 3.5–5.3)
Sodium: 138 mEq/L (ref 135–145)
Total Bilirubin: 0.4 mg/dL (ref 0.3–1.2)
Total Protein: 6 g/dL (ref 6.0–8.3)

## 2013-04-22 NOTE — Progress Notes (Signed)
This office note has been dictated.

## 2013-04-23 ENCOUNTER — Ambulatory Visit: Payer: Medicare Other

## 2013-04-23 ENCOUNTER — Telehealth (HOSPITAL_COMMUNITY): Payer: Self-pay | Admitting: Radiology

## 2013-04-23 ENCOUNTER — Other Ambulatory Visit: Payer: Medicare Other

## 2013-04-23 NOTE — Progress Notes (Signed)
CC:   [name], M.D. [facility]  DIAGNOSES: 1. Recurrent diffuse large cell non-Hodgkin's lymphoma. 2. 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  CURRENT THERAPY:  The patient halfway through radiation therapy for right supraorbital recurrence.  INTERIM HISTORY:  [name] comes in for followup.  She is doing much better.  Every time I see her she seems to be getting stronger. She is tolerating radiation pretty well.  She is halfway through.  She has already noticed decrease in the right supraorbital mass.  She, unfortunately how now noted a nodule just anterior to the right year.  This measures about 3 or 4 mm.  It is not tender or mobile.  We will have to watch this.  Her appetite is doing better.  She is having some swelling in her legs. She is seeing Nephrology.  She said that they are changing her medications around.  She said she had to go to the hospital recently as her blood pressure was 250/90.  She is still making urine.  She is having no problems with her bowels.  She is having no rashes.  There is no swallowing difficulties.  There is no chest wall pain.  She is having no double vision or blurred vision.  PHYSICAL EXAMINATION:  General:  This is a well-developed, well- nourished white female in no obvious distress.  Vital signs: Temperature of 97.4, pulse 47, respiratory rate 16, blood pressure 176/69.  Weight is 151.  Head and neck:  Normocephalic, atraumatic skull.  There are no ocular or oral lesions.  There are no palpable cervical or supraclavicular lymph nodes.  Lungs:  Clear to percussion and auscultation bilaterally.  Cardiac:  Regular rate and rhythm with a normal S1 and S2.  There are no murmurs, rubs or bruits.  Abdomen:  Soft with good bowel sounds.  There is no palpable abdominal mass.  There is no palpable hepatosplenomegaly.  Back:  No tenderness over the spine, ribs, or hips.  Extremities:  Shows 1+ edema in her legs.  Skin:   Shows no rashes.  Neurological:  Shows no focal neurological deficits.  LABORATORY STUDIES:  White cell count is 6.9, hemoglobin 9.3, hematocrit 27.9, platelet count 231.  MCV is 93.  IMPRESSION:  [name] is a very charming, 72 year old white female with recurrent large cell non-Hodgkin lymphoma.  We tried to treat this with chemotherapy.  She tolerated this incredibly poorly.  We gave her 1 dose of RICE.  She was hospitalized.  She went into renal failure.  She basically was hospitalized for almost about a month.  She now is improving.  The lymphoma in her abdomen basically resolved with chemotherapy.  She is getting palliative radiation to the right supraorbital region.  This is improving. We are going to have to watch this right preauricular nodule. I want to see her back in another month. I believe that the anemia is from the renal insufficiency.  However, with her blood pressure, I cannot give her Aranesp or Procrit as this would really exaggerate her blood pressure.    ______________________________ Josph Macho, M.D. PRE/MEDQ  D:  04/22/2013  T:  04/23/2013  Job:  [id number]

## 2013-04-23 NOTE — Telephone Encounter (Signed)
Received call from patient wanting to cancel Radiation Therapy appointment today.  She has been sick all night and does not feel well and would not like to come today.  She hopes to feel better tomorrow and make her scheduled appointment for Friday June 27.  Patient informed that she called Interventional Radiology and not Radiation Oncology.  I let patient know that I would inform Rad Onc of her desire to cancel appointment for today, and I gave her their extension for the future.

## 2013-04-24 ENCOUNTER — Telehealth: Payer: Self-pay | Admitting: Hematology & Oncology

## 2013-04-24 ENCOUNTER — Ambulatory Visit
Admission: RE | Admit: 2013-04-24 | Discharge: 2013-04-24 | Disposition: A | Discharge status: Home / Self Care | Payer: Medicare Other | Source: Ambulatory Visit | Attending: Radiation Oncology | Admitting: Radiation Oncology

## 2013-04-24 ENCOUNTER — Other Ambulatory Visit: Payer: Medicare Other

## 2013-04-24 NOTE — Telephone Encounter (Signed)
Pt aware of 7-25 appointment

## 2013-04-27 ENCOUNTER — Ambulatory Visit
Admission: RE | Admit: 2013-04-27 | Discharge: 2013-04-27 | Disposition: A | Discharge status: Home / Self Care | Payer: Medicare Other | Source: Ambulatory Visit | Attending: Radiation Oncology | Admitting: Radiation Oncology

## 2013-04-27 ENCOUNTER — Ambulatory Visit
Admission: RE | Admit: 2013-04-27 | Discharge: 2013-04-27 | Disposition: A | Discharge status: Home / Self Care | Payer: Medicare Other | Source: Ambulatory Visit | Attending: Nephrology | Admitting: Nephrology

## 2013-04-27 DIAGNOSIS — R7989 Other specified abnormal findings of blood chemistry: Secondary | ICD-10-CM

## 2013-04-27 DIAGNOSIS — C8330 Diffuse large B-cell lymphoma, unspecified site: Secondary | ICD-10-CM

## 2013-04-27 NOTE — Progress Notes (Signed)
[facility]    Radiation Oncology [address] [place]     Maryln Gottron, M.D. [place], [place] [address]               Billie Lade, M.D., Ph.D. Phone: [phone]      Molli Hazard A. Kathrynn Running, M.D. Fax: [phone]      Radene Gunning, M.D., Ph.D.         Lurline Hare, M.D.         Grayland Jack, M.D Weekly Treatment Management Note  Name: [name]     MRN: [id number]        CSN: [id number] Date: 04/27/2013      DOB: [birth year]  CC: Diana Espy, MD         Kevan Ny    Status: Outpatient  Diagnosis: The encounter diagnosis was Large cell (diffuse) non-Hodgkin's lymphoma.  Current Dose: 21.6 Gy  Current Fraction: 12  Planned Dose: 36 Gy  Narrative: [name] was seen today for weekly treatment management. The chart was checked and CBCT  were reviewed. She continues to tolerate the treatments well. She denies any significant itching or discomfort in the orbital area.   Advicor; Crestor; Lipitor; Lovastatin; Morphine and related; Niaspan; Nsaids; Pravachol; Sulfa antibiotics; Vytorin; Welchol; Zetia; and Zocor Current Outpatient Prescriptions  Medication Sig Dispense Refill  . amLODipine (NORVASC) 2.5 MG tablet Take 2.5 mg by mouth daily.      . cholecalciferol (VITAMIN D) 1000 UNITS tablet Take 2,000 Units by mouth daily.        . cloNIDine (CATAPRES) 0.1 MG tablet Take 0.1 mg by mouth 2 (two) times daily.       Marland Kitchen docusate sodium 100 MG CAPS Take 200 mg by mouth 3 (three) times daily as needed for constipation.  60 capsule  2  . esomeprazole (NEXIUM) 40 MG capsule Take 40 mg by mouth daily.        Marland Kitchen lidocaine-prilocaine (EMLA) cream Apply 1 application topically as needed (for port-a-cath access).      . metoprolol (LOPRESSOR) 50 MG tablet Take 50 mg by mouth 2 (two) times daily.      . ondansetron (ZOFRAN-ODT) 8 MG disintegrating tablet Take 8 mg by mouth every 8 (eight) hours as needed for nausea.       . prochlorperazine (COMPAZINE) 10 MG  tablet Take 10 mg by mouth every 8 (eight) hours as needed (for nausea).       . traMADol (ULTRAM) 50 MG tablet Take 50-100 mg by mouth every 6 (six) hours as needed for pain.        No current facility-administered medications for this encounter.   Labs:  Lab Results  Component Value Date   WBC 6.9 04/22/2013   HGB 9.3* 04/22/2013   HCT 27.9* 04/22/2013   MCV 93 04/22/2013   PLT 231 04/22/2013   Lab Results  Component Value Date   CREATININE 5.10* 04/22/2013   BUN 40* 04/22/2013   NA 138 04/22/2013   K 4.8 04/22/2013   CL 103 04/22/2013   CO2 23 04/22/2013   Lab Results  Component Value Date   ALT <8 04/22/2013   AST 7 04/22/2013   PHOS 3.3 02/13/2013   BILITOT 0.4 04/22/2013    Physical Examination:  vitals were not taken for this visit.   Wt Readings from Last 3 Encounters:  04/22/13 151 lb (68.493 kg)  04/14/13 151 lb 6.4 oz (68.675 kg)  04/09/13 149 lb (67.586 kg)  The right supraorbital mass has decreased in size over the past week, as well as the periorbital edema    Assessment:   Patient tolerating treatments well. The right supraorbital mass is responding well to treatment.  Plan: Continue treatment per original radiation prescription

## 2013-04-28 ENCOUNTER — Ambulatory Visit
Admission: RE | Admit: 2013-04-28 | Discharge: 2013-04-28 | Disposition: A | Discharge status: Home / Self Care | Payer: Medicare Other | Source: Ambulatory Visit | Attending: Radiation Oncology | Admitting: Radiation Oncology

## 2013-04-29 ENCOUNTER — Ambulatory Visit
Admission: RE | Admit: 2013-04-29 | Discharge: 2013-04-29 | Disposition: A | Discharge status: Home / Self Care | Payer: Medicare Other | Source: Ambulatory Visit | Attending: Radiation Oncology | Admitting: Radiation Oncology

## 2013-04-29 ENCOUNTER — Ambulatory Visit: Payer: Medicare Other

## 2013-04-30 ENCOUNTER — Ambulatory Visit
Admission: RE | Admit: 2013-04-30 | Discharge: 2013-04-30 | Disposition: A | Discharge status: Home / Self Care | Payer: Medicare Other | Source: Ambulatory Visit | Attending: Radiation Oncology | Admitting: Radiation Oncology

## 2013-05-04 ENCOUNTER — Ambulatory Visit
Admission: RE | Admit: 2013-05-04 | Discharge: 2013-05-04 | Disposition: A | Discharge status: Home / Self Care | Payer: Medicare Other | Source: Ambulatory Visit | Attending: Radiation Oncology | Admitting: Radiation Oncology

## 2013-05-05 ENCOUNTER — Ambulatory Visit
Admission: RE | Admit: 2013-05-05 | Discharge: 2013-05-05 | Disposition: A | Discharge status: Home / Self Care | Payer: Medicare Other | Source: Ambulatory Visit | Attending: Radiation Oncology | Admitting: Radiation Oncology

## 2013-05-05 ENCOUNTER — Encounter: Payer: Self-pay | Admitting: Radiation Oncology

## 2013-05-05 VITALS — BP 159/64 | HR 53 | Temp 97.9°F | Resp 20 | Wt 149.0 lb

## 2013-05-05 DIAGNOSIS — C8589 Other specified types of non-Hodgkin lymphoma, extranodal and solid organ sites: Secondary | ICD-10-CM

## 2013-05-05 NOTE — Progress Notes (Signed)
Pt denies pain, loss of appetite. She c/o "eyes itching". She states she tried Clear Eyes w/no relief. She has also tried eyes drops for itchy eyes w/little relief.

## 2013-05-05 NOTE — Progress Notes (Signed)
[facility]    Radiation Oncology [address] [place]     Maryln Gottron, M.D. [place], [place] [address]               Billie Lade, M.D., Ph.D. Phone: [phone]      Molli Hazard A. Kathrynn Running, M.D. Fax: [phone]      Radene Gunning, M.D., Ph.D.         Lurline Hare, M.D.         Grayland Jack, M.D Weekly Treatment Management Note  Name: [name]     MRN: [id number]        CSN: [id number] Date: 05/05/2013      DOB: September 06, 1941  CC: Diana Espy, MD         Kevan Ny    Status: Outpatient  Diagnosis: The encounter diagnosis was Other malignant lymphomas, unspecified site, extranodal and solid organ sites.  Current Dose: 30.6 Gy  Current Fraction: 17  Planned Dose: 36 Gy  Narrative: [name] was seen today for weekly treatment management. The chart was checked and CBCT  were reviewed. She is tolerating treatments well. She has however noticed itching in both eyes. She did not obtain sensitive eyes saline solution.  I recommended she purchase and use this in both eyes.  Advicor; Crestor; Lipitor; Lovastatin; Morphine and related; Niaspan; Nsaids; Pravachol; Sulfa antibiotics; Vytorin; Welchol; Zetia; and Zocor Current Outpatient Prescriptions  Medication Sig Dispense Refill  . amLODipine (NORVASC) 2.5 MG tablet Take 2.5 mg by mouth daily.      . cholecalciferol (VITAMIN D) 1000 UNITS tablet Take 2,000 Units by mouth daily.        . cloNIDine (CATAPRES) 0.1 MG tablet Take 0.1 mg by mouth 2 (two) times daily.       Marland Kitchen docusate sodium 100 MG CAPS Take 200 mg by mouth 3 (three) times daily as needed for constipation.  60 capsule  2  . esomeprazole (NEXIUM) 40 MG capsule Take 40 mg by mouth daily.        Marland Kitchen lidocaine-prilocaine (EMLA) cream Apply 1 application topically as needed (for port-a-cath access).      . metoprolol (LOPRESSOR) 50 MG tablet Take 50 mg by mouth 2 (two) times daily.      . ondansetron (ZOFRAN-ODT) 8 MG disintegrating tablet Take 8  mg by mouth every 8 (eight) hours as needed for nausea.       . Polyvinyl Alcohol-Povidone (CLEAR EYES ALL SEASONS OP) Apply to eye.      . prochlorperazine (COMPAZINE) 10 MG tablet Take 10 mg by mouth every 8 (eight) hours as needed (for nausea).       . traMADol (ULTRAM) 50 MG tablet Take 50-100 mg by mouth every 6 (six) hours as needed for pain.        No current facility-administered medications for this encounter.   Labs:  Lab Results  Component Value Date   WBC 6.9 04/22/2013   HGB 9.3* 04/22/2013   HCT 27.9* 04/22/2013   MCV 93 04/22/2013   PLT 231 04/22/2013   Lab Results  Component Value Date   CREATININE 5.10* 04/22/2013   BUN 40* 04/22/2013   NA 138 04/22/2013   K 4.8 04/22/2013   CL 103 04/22/2013   CO2 23 04/22/2013   Lab Results  Component Value Date   ALT <8 04/22/2013   AST 7 04/22/2013   PHOS 3.3 02/13/2013   BILITOT 0.4 04/22/2013    Physical Examination:  weight  is 149 lb (67.586 kg). Her temperature is 97.9 F (36.6 C). Her blood pressure is 159/64 and her pulse is 53. Her respiration is 20.    Wt Readings from Last 3 Encounters:  05/05/13 149 lb (67.586 kg)  04/22/13 151 lb (68.493 kg)  04/14/13 151 lb 6.4 oz (68.675 kg)    The right supraorbital mass has continued to flatten out. There is minimal swelling noted in the region. Both conjunctiva are clear Lungs - Normal respiratory effort, chest expands symmetrically. Lungs are clear to auscultation, no crackles or wheezes.  Heart has regular rhythm and rate  Abdomen is soft and non tender with normal bowel sounds  Assessment:  Patient tolerating treatments well  Plan: Continue treatment per original radiation prescription

## 2013-05-06 ENCOUNTER — Ambulatory Visit
Admission: RE | Admit: 2013-05-06 | Discharge: 2013-05-06 | Disposition: A | Discharge status: Home / Self Care | Payer: Medicare Other | Source: Ambulatory Visit | Attending: Radiation Oncology | Admitting: Radiation Oncology

## 2013-05-07 ENCOUNTER — Ambulatory Visit: Payer: Medicare Other

## 2013-05-07 ENCOUNTER — Ambulatory Visit
Admission: RE | Admit: 2013-05-07 | Discharge: 2013-05-07 | Disposition: A | Discharge status: Home / Self Care | Payer: Medicare Other | Source: Ambulatory Visit | Attending: Radiation Oncology | Admitting: Radiation Oncology

## 2013-05-08 ENCOUNTER — Ambulatory Visit
Admission: RE | Admit: 2013-05-08 | Discharge: 2013-05-08 | Disposition: A | Discharge status: Home / Self Care | Payer: Medicare Other | Source: Ambulatory Visit | Attending: Radiation Oncology | Admitting: Radiation Oncology

## 2013-05-22 ENCOUNTER — Other Ambulatory Visit (HOSPITAL_BASED_OUTPATIENT_CLINIC_OR_DEPARTMENT_OTHER): Payer: Medicare Other | Admitting: Lab

## 2013-05-22 ENCOUNTER — Ambulatory Visit (HOSPITAL_BASED_OUTPATIENT_CLINIC_OR_DEPARTMENT_OTHER): Payer: Medicare Other | Admitting: Hematology & Oncology

## 2013-05-22 ENCOUNTER — Ambulatory Visit (HOSPITAL_BASED_OUTPATIENT_CLINIC_OR_DEPARTMENT_OTHER)
Admission: RE | Admit: 2013-05-22 | Discharge: 2013-05-22 | Disposition: A | Discharge status: Home / Self Care | Payer: Medicare Other | Source: Ambulatory Visit | Attending: Hematology & Oncology | Admitting: Hematology & Oncology

## 2013-05-22 VITALS — BP 163/52 | HR 43 | Temp 97.6°F | Resp 16 | Ht 64.0 in | Wt 147.0 lb

## 2013-05-22 DIAGNOSIS — G319 Degenerative disease of nervous system, unspecified: Secondary | ICD-10-CM | POA: Insufficient documentation

## 2013-05-22 DIAGNOSIS — C8330 Diffuse large B-cell lymphoma, unspecified site: Secondary | ICD-10-CM

## 2013-05-22 DIAGNOSIS — C8589 Other specified types of non-Hodgkin lymphoma, extranodal and solid organ sites: Secondary | ICD-10-CM

## 2013-05-22 DIAGNOSIS — I6789 Other cerebrovascular disease: Secondary | ICD-10-CM | POA: Insufficient documentation

## 2013-05-22 DIAGNOSIS — R221 Localized swelling, mass and lump, neck: Secondary | ICD-10-CM | POA: Insufficient documentation

## 2013-05-22 DIAGNOSIS — Z87898 Personal history of other specified conditions: Secondary | ICD-10-CM | POA: Insufficient documentation

## 2013-05-22 DIAGNOSIS — R220 Localized swelling, mass and lump, head: Secondary | ICD-10-CM | POA: Insufficient documentation

## 2013-05-22 LAB — CBC WITH DIFFERENTIAL (CANCER CENTER ONLY)
BASO#: 0 10*3/uL (ref 0.0–0.2)
Eosinophils Absolute: 0.1 10*3/uL (ref 0.0–0.5)
HGB: 9 g/dL — ABNORMAL LOW (ref 11.6–15.9)
LYMPH#: 3.3 10*3/uL (ref 0.9–3.3)
MONO%: 9.1 % (ref 0.0–13.0)
NEUT#: 1.5 10*3/uL (ref 1.5–6.5)
Platelets: 232 10*3/uL (ref 145–400)
RBC: 2.79 10*6/uL — ABNORMAL LOW (ref 3.70–5.32)
WBC: 5.4 10*3/uL (ref 3.9–10.0)

## 2013-05-22 LAB — CMP (CANCER CENTER ONLY)
ALT(SGPT): 9 U/L — ABNORMAL LOW (ref 10–47)
Alkaline Phosphatase: 47 U/L (ref 26–84)
CO2: 26 mEq/L (ref 18–33)
Creat: 5.9 mg/dl (ref 0.6–1.2)
Glucose, Bld: 132 mg/dL — ABNORMAL HIGH (ref 73–118)
Sodium: 139 mEq/L (ref 128–145)
Total Bilirubin: 0.6 mg/dl (ref 0.20–1.60)
Total Protein: 6.4 g/dL (ref 6.4–8.1)

## 2013-05-22 LAB — RETICULOCYTES (CHCC)
ABS Retic: 23.3 10*3/uL (ref 19.0–186.0)
RBC.: 2.91 MIL/uL — ABNORMAL LOW (ref 3.87–5.11)
Retic Ct Pct: 0.8 % (ref 0.4–2.3)

## 2013-05-22 LAB — IRON AND TIBC CHCC
%SAT: 95 % — ABNORMAL HIGH (ref 21–57)
Iron: 166 ug/dL — ABNORMAL HIGH (ref 41–142)
TIBC: 175 ug/dL — ABNORMAL LOW (ref 236–444)

## 2013-05-22 LAB — FERRITIN CHCC: Ferritin: 1426 ng/ml — ABNORMAL HIGH (ref 9–269)

## 2013-05-22 NOTE — Progress Notes (Signed)
This office note has been dictated.

## 2013-05-23 NOTE — Progress Notes (Signed)
CC:   [name], M.D. Cecille Aver, M.D.  DIAGNOSES: 1. Recurrent diffuse large cell non-Hodgkin's lymphoma. 2. Renal insufficiency. 3. Insulin-dependent diabetes. 4. 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  CURRENT THERAPY:  Patient recently completed radiation therapy to a right supraorbital recurrence.  INTERIM HISTORY:  Ms. [name] comes in for followup.  She probably looks a little bit worse then when I saw her last.  She now has a mass in the right parotid area.  We did go ahead and do a CT scan on her face today. This unfortunately did show a mass in the right parotid region measuring 3 x 1 cm.  I suspect that this is a recurrence again.  She is not eating that much.  She has intermittent abdominal pain.  She feels tired all the time.  I suspect a lot of her symptoms are reflective of her renal insufficiency.  She is nauseated.  She is not sure when she goes back to see the nephrologist.  She is having no diarrhea.  There is no melena or bright red blood per rectum.  She denies any leg swelling.  There have been no rashes.  Overall, her performance status is ECOG 2.  PHYSICAL EXAM:  General:  This is a fairly well developed, well- nourished white female in no obvious distress.  Vital Signs:  Show a temperature of 97.6, pulse 43, respiratory rate 16, blood pressure 163/52.  Weight is 147.  Head and Neck:  Show a normocephalic, atraumatic skull.  There are no ocular or oral lesions.  She does have a firm swelling in the right parotid region.  This measures about 4 x 2 cm.  It is slightly tender.  It is not erythematous.  There are no intraoral lesions.  There is no adenopathy on the neck.  Lungs:  Clear bilaterally.  Cardiac:  Regular rate and rhythm with a normal S1, S2. There are no murmurs, rubs, or bruits.  Abdomen:  Soft.  She has slightly decreased bowel sounds.  There is no fluid wave.  There is no palpable abdominal mass.  There is no palpable  hepatosplenomegaly. Back:  Shows no tenderness over the spine, ribs, or hips.  Extremities: Show no clubbing, cyanosis, or edema.  LABORATORY STUDIES:  White cell count is 5.4.  Hemoglobin 9, hematocrit 26.6, platelet count 232.  Ferritin is 1400 with an iron saturation of 95%.  BUN is 45.  Creatinine 5.9.  Calcium 9.2 with an albumin of 3.4.  IMPRESSION:  [name] is a 72 year old white female with recurrent large cell lymphoma.  We tried to give her chemotherapy with R-ICE. Even with a dosage reduction, she did very poorly.  She was in the hospital for almost a month.  She developed renal failure.  She just had a miserable time.  We finished radiation therapy to a supraorbital recurrence.  This has helped.  Now it looks like she has another recurrence in the right parotid region.  I do not think that we need a biopsy.  I do not see how a biopsy is really going to add to what we know clinically.  I talked to her and her husband.  I told them that this is going to be a recurrent issue for her.  I left a message for Dr. Trina Ao office.  He will call me on Monday.  I want see [name] back in about 3 weeks' time.  I just cannot think of any chemotherapy program that she would  be able to tolerate that we could utilize.  I spent a good 45 minutes or so with [name] and her husband.    ______________________________ Josph Macho, M.D. PRE/MEDQ  D:  05/22/2013  T:  05/23/2013  Job:  [id number]

## 2013-05-26 ENCOUNTER — Encounter: Payer: Self-pay | Admitting: Radiation Oncology

## 2013-05-26 DIAGNOSIS — Z923 Personal history of irradiation: Secondary | ICD-10-CM | POA: Insufficient documentation

## 2013-05-26 NOTE — Progress Notes (Addendum)
Lymphoma Location(s) / Histology: 12/05/09 bone marrow Biopsy- hypercellular marrow w/B cell lymphoproliferative process consistent w/ NHL, B cell, 12/14/09 right cervical lymph node- NHL, large B cell, 01/14/13 small intestine- high grade NHL  Patient presented 11/2009 with symptoms of: weakness, admitted to hospital   Biopsies of small intestine , ileocecal mass(if applicable) revealed: high grade NHL, B cell, 01/14/13   Past/Anticipated interventions by medical oncology, if any: 2011 8 cycles R-CHOP chemotherapy, 01/23/13 received RICE chemotherapy in hospital.  No further chemo planned at this time. Weight changes, if any, over the past 6 months: loss of 23 lbs  Recurrent fevers, or drenching night sweats, if any: none   SAFETY ISSUES:  Prior radiation? Yes, completed radiation treatment on 05/11/13 to right supraorbital area Pacemaker/ICD? no  Possible current pregnancy? no  Is the patient on methotrexate? No  Current Complaints / other details: CT scan 05/22/13 : soft tissue density in right parotid region. Pt denies pain today but states her right cheek/jaw "is sore". She has pain, soreness in right cheek/neck/ear when she sleeps and lies on her right side. Pt denies difficulty eating, swallowing, earache, drainage from right ear, nausea. She states her appetite is improving; she is fatigued.  Pt denies pain, vision changes, tearing of right eye since recent radiation treatment to this area. She states she "needs her glasses changed".

## 2013-05-27 ENCOUNTER — Encounter: Payer: Self-pay | Admitting: Radiation Oncology

## 2013-05-27 ENCOUNTER — Ambulatory Visit
Admission: RE | Admit: 2013-05-27 | Discharge: 2013-05-27 | Disposition: A | Discharge status: Home / Self Care | Payer: Medicare Other | Source: Ambulatory Visit | Attending: Radiation Oncology | Admitting: Radiation Oncology

## 2013-05-27 ENCOUNTER — Ambulatory Visit: Payer: Medicare Other

## 2013-05-27 VITALS — BP 178/69 | HR 45 | Temp 98.2°F | Resp 20 | Wt 148.0 lb

## 2013-05-27 DIAGNOSIS — C8589 Other specified types of non-Hodgkin lymphoma, extranodal and solid organ sites: Secondary | ICD-10-CM

## 2013-05-27 DIAGNOSIS — C8330 Diffuse large B-cell lymphoma, unspecified site: Secondary | ICD-10-CM

## 2013-05-27 DIAGNOSIS — C9000 Multiple myeloma not having achieved remission: Secondary | ICD-10-CM | POA: Insufficient documentation

## 2013-05-27 NOTE — Addendum Note (Signed)
Encounter addended by: Glennie Hawk, RN on: 05/27/2013  9:41 AM<BR>     Documentation filed: Charges VN

## 2013-05-27 NOTE — Progress Notes (Signed)
Please see the Nurse Progress Note in the MD Initial Consult Encounter for this patient. 

## 2013-05-27 NOTE — Progress Notes (Signed)
Radiation Oncology         ([phone]) [phone] ________________________________  Name: [name] MRN: [id number]  Date: 05/27/2013  DOB: 05/10/1941  Reevaluation note  CC: [name], MD  Josph Macho, MD  Diagnosis:   Recurrent lymphoma  Interval Since Last Radiation:  3  weeks  Narrative:  The patient returns today for further evaluation. The patient recently completed treatments to the right supraorbital area for recurrent lymphoma to this area. She did have good response to treatment with improvement in her vision. The mass essentially disappeared. Soon after the patient completed her radiation  she developed swelling in the right parotid area. This prompted a CT scan which was performed on July 25.  A 3 x 1 cm soft tissue density was noted in the region of the right parotid glands concerning for mass or neoplasm. With these new findings the patient is now seen for consideration for additional treatment. This parotid mass is causing pain for the patient and making it difficult for her to sleep at night.  She denies any hearing problems or chewing difficulties                           ALLERGIES:  is allergic to advicor; crestor; lipitor; lovastatin; morphine and related; niaspan; nsaids; pravachol; sulfa antibiotics; vytorin; welchol; zetia; and zocor.  Meds: Current Outpatient Prescriptions  Medication Sig Dispense Refill  . amLODipine (NORVASC) 2.5 MG tablet Take 2.5 mg by mouth daily.      . cholecalciferol (VITAMIN D) 1000 UNITS tablet Take 2,000 Units by mouth daily.        . cloNIDine (CATAPRES) 0.1 MG tablet Take 0.1 mg by mouth 2 (two) times daily.       Marland Kitchen docusate sodium 100 MG CAPS Take 200 mg by mouth 3 (three) times daily as needed for constipation.  60 capsule  2  . esomeprazole (NEXIUM) 40 MG capsule Take 40 mg by mouth daily.        Marland Kitchen lidocaine-prilocaine (EMLA) cream Apply 1 application topically as needed (for port-a-cath access).      . metoprolol  (LOPRESSOR) 50 MG tablet Take 50 mg by mouth 2 (two) times daily.      . ondansetron (ZOFRAN-ODT) 8 MG disintegrating tablet Take 8 mg by mouth every 8 (eight) hours as needed for nausea.       . polyethylene glycol powder (MIRALAX) powder Take 17 g by mouth as needed. Pt will start taking daily.      . Polyvinyl Alcohol-Povidone (CLEAR EYES ALL SEASONS OP) Apply to eye.      . prochlorperazine (COMPAZINE) 10 MG tablet Take 10 mg by mouth every 8 (eight) hours as needed (for nausea).       . traMADol (ULTRAM) 50 MG tablet Take 50-100 mg by mouth every 6 (six) hours as needed for pain.        No current facility-administered medications for this encounter.    Physical Findings: The patient is in no acute distress. Patient is alert and oriented.  weight is 148 lb (67.132 kg). Her oral temperature is 98.2 F (36.8 C). Her blood pressure is 178/69 and her pulse is 45. Her respiration is 20. .  The right supraorbital mass is essentially resolved. There is some hyperpigmentation changes noted. A large mass is noted in the right preauricular area extending into the right upper neck. This measures 7 x 4 cm in size. There is no  other adenopathy or mass appreciated in the head and neck area. The supraclavicular and axillary areas are free of adenopathy. The lungs are clear to auscultation. The heart has a regular rhythm and rate.  Lab Findings: Lab Results  Component Value Date   WBC 5.4 05/22/2013   HGB 9.0* 05/22/2013   HCT 26.6* 05/22/2013   MCV 95 05/22/2013   PLT 232 05/22/2013      Radiographic Findings: Ct Head Wo Contrast  05/22/2013   *RADIOLOGY REPORT*  Clinical Data: Right parotid swelling, history of lymphoma  CT HEAD WITHOUT CONTRAST  Technique:  Contiguous axial images were obtained from the base of the skull through the vertex without contrast.  Comparison: CT scan of April 14, 2013.  Findings: Bony calvarium appears intact.  No mass effect or midline shift is noted.  Mild diffuse  cortical atrophy is noted. Ventricular size is within normal limits.  Mild chronic ischemic white matter disease is noted.  There is no evidence of intracranial mass lesion, hemorrhage or acute infarction.  However, 3 x 1 cm soft tissue density is seen in the region of the right parotid gland concerning for mass or neoplasm. Only the superior portion of the right parotid region is included in the field of view, and therefore the entire mass is not visualized. Right periorbital soft tissue abnormality noted on prior exam is significantly smaller currently.  IMPRESSION: Mild diffuse cortical atrophy.  Mild chronic ischemic white matter disease.  No acute intracranial abnormality seen. Right periorbital soft tissue density noted on prior exam is significantly smaller currently.  However, abnormal soft tissue density is seen in the visualized portion of the right parotid region concerning for neoplasm or malignancy; further evaluation with MRI is recommended.   Original Report Authenticated By: Lupita Raider.,  M.D.   US Renal  04/27/2013   *RADIOLOGY REPORT*  Clinical Data: Chronic kidney disease, stage III. Evaluate for obstruction.  RENAL/URINARY TRACT ULTRASOUND COMPLETE  Comparison:  Renal ultrasound 02/03/2013.  Findings:  Right Kidney:  Diffusely echogenic renal parenchyma, indicative of medical renal disease.  8.7 cm in length.  No hydronephrosis. Multiple small anechoic lesions with increased through transmission, largest of which measures approximately 1.6 x 1.4 x 1.7 cm.  Left Kidney:  Diffusely echogenic renal parenchyma indicative of medical renal disease.  9.0 cm in length. No hydronephrosis. Multiple anechoic lesions with associated increase through transmission, largest of which measures 1.7 x 1.3 x 0.3 cm.  Bladder:  Completely decompressed.  IMPRESSION: 1.  No evidence of hydronephrosis. 2.  Multiple small cysts in the kidneys bilaterally, as above. 3.  Echogenic renal parenchyma bilaterally,  compatible with medical renal disease.   Original Report Authenticated By: Trudie Reed, M.D.    Impression:  Progressive lymphoma in the right preauricular and upper neck region. The patient would be a good candidate for palliative radiation therapy directed at this mass. She appears to have had a good response to her radiation therapy directed at her right supraorbital mass.  Plan:  Simulation and planning at 11:00 today.   The patient will be treated with IMRT to limit potential overlap with her prior radiation therapy directed at the right orbital region. I anticipate approximately 3 weeks of radiation therapy as part of her management. The patient will also be set up for a PET scan for restaging purposes.  _____________________________________  -----------------------------------  Billie Lade, PhD, MD

## 2013-05-27 NOTE — Progress Notes (Signed)
  Radiation Oncology         ([phone]) [phone] ________________________________  Name: [name] MRN: [id number]  Date: 05/27/2013  DOB: 1941-06-15  End of Treatment Note  Diagnosis:   Recurrent lymphoma     Indication for treatment:  Periorbital mass with visual difficulties and pain       Radiation treatment dates:    June 12 through July 20  Site/dose:   Right supraorbital area, 36 gray and 20 fractions  Beams/energy:   3-D conformal treatment  Narrative: The patient tolerated radiation treatment relatively well.   She did have some fatigue with her treatments.  Her tumor mass did respond well to treatment . Her field of vision also improved.  Plan: The patient has completed radiation treatment. The patient will return to radiation oncology clinic for routine followup in one month. I advised them to call or return sooner if they have any questions or concerns related to their recovery or treatment.  -----------------------------------  Billie Lade, PhD, MD

## 2013-05-27 NOTE — Progress Notes (Signed)
  Radiation Oncology         [phone] ________________________________  Name: [name] MRN: [id number]  Date: 05/27/2013  DOB: 1941/07/11  SIMULATION AND TREATMENT PLANNING NOTE  DIAGNOSIS:  Recurrent lymphoma  NARRATIVE:  The patient was brought to the CT Simulation planning suite.  Identity was confirmed.  All relevant records and images related to the planned course of therapy were reviewed.  The patient freely provided informed written consent to proceed with treatment after reviewing the details related to the planned course of therapy. The consent form was witnessed and verified by the simulation staff.  Then, the patient was set-up in a stable reproducible  supine position for radiation therapy.  CT images were obtained.  Surface markings were placed.  The CT images were loaded into the planning software.  Then the target and avoidance structures were contoured.  Treatment planning then occurred.  The radiation prescription was entered and confirmed.  Then, I designed and supervised the construction of a total of 1 medically necessary complex treatment devices.  I have requested : Intensity Modulated Radiotherapy (IMRT) is medically necessary for this case for the following reason:  Previous treatment to this area..  I have ordered:dose calc.  PLAN:  The patient will receive 35 Gy in 14 fractions.  ________________________________  Special treatment procedure note  The patient has recently received radiation therapy to the right periorbital area. Additional time was taken in reviewing the patient's previous radiation therapy as it relates to her current set up. Given the increased potential for toxicities as well as the necessity for close monitoring of the patient and additional beam modification,  this constitutes a special treatment procedure. -----------------------------------  Billie Lade, PhD, MD

## 2013-05-28 ENCOUNTER — Encounter (HOSPITAL_COMMUNITY)
Admission: RE | Admit: 2013-05-28 | Discharge: 2013-05-28 | Disposition: A | Discharge status: Home / Self Care | Payer: Medicare Other | Source: Ambulatory Visit | Attending: Radiation Oncology | Admitting: Radiation Oncology

## 2013-05-28 ENCOUNTER — Ambulatory Visit: Payer: Medicare Other

## 2013-05-28 DIAGNOSIS — C8589 Other specified types of non-Hodgkin lymphoma, extranodal and solid organ sites: Secondary | ICD-10-CM

## 2013-05-28 MED ORDER — FLUDEOXYGLUCOSE F - 18 (FDG) INJECTION
19.3000 | Freq: Once | INTRAVENOUS | Status: AC | PRN
  Administered 2013-05-28: 19.3 via INTRAVENOUS

## 2013-05-29 ENCOUNTER — Ambulatory Visit: Payer: Medicare Other

## 2013-06-01 ENCOUNTER — Ambulatory Visit: Payer: Medicare Other

## 2013-06-02 ENCOUNTER — Ambulatory Visit: Payer: Medicare Other

## 2013-06-03 ENCOUNTER — Other Ambulatory Visit: Payer: Self-pay

## 2013-06-03 ENCOUNTER — Ambulatory Visit
Admission: RE | Admit: 2013-06-03 | Discharge: 2013-06-03 | Disposition: A | Discharge status: Home / Self Care | Payer: Medicare Other | Source: Ambulatory Visit | Attending: Radiation Oncology | Admitting: Radiation Oncology

## 2013-06-03 ENCOUNTER — Ambulatory Visit: Payer: Medicare Other

## 2013-06-04 ENCOUNTER — Ambulatory Visit
Admission: RE | Admit: 2013-06-04 | Discharge: 2013-06-04 | Disposition: A | Discharge status: Home / Self Care | Payer: Medicare Other | Source: Ambulatory Visit | Attending: Radiation Oncology | Admitting: Radiation Oncology

## 2013-06-04 ENCOUNTER — Ambulatory Visit: Payer: Medicare Other

## 2013-06-04 DIAGNOSIS — C8330 Diffuse large B-cell lymphoma, unspecified site: Secondary | ICD-10-CM

## 2013-06-04 MED ORDER — BIAFINE EX EMUL
Freq: Two times a day (BID) | CUTANEOUS | Status: DC
  Administered 2013-06-04: 17:00:00 via TOPICAL

## 2013-06-04 NOTE — Progress Notes (Signed)
[name] here for post sim education.  She was given the Radiation Therapy and You book and discussed the potential side effects of radiation including fatigue, hair loss, mouth changes and throat changes.  She was also given biafine cream and advised her to apply it to the treatment area twice a day with the first at least 4 hours before treatment and the second at bedtime.  She was advised to contact nursing with any questions or concerns.

## 2013-06-05 ENCOUNTER — Ambulatory Visit
Admission: RE | Admit: 2013-06-05 | Discharge: 2013-06-05 | Disposition: A | Discharge status: Home / Self Care | Payer: Medicare Other | Source: Ambulatory Visit | Attending: Radiation Oncology | Admitting: Radiation Oncology

## 2013-06-05 ENCOUNTER — Ambulatory Visit: Payer: Medicare Other

## 2013-06-05 NOTE — Addendum Note (Signed)
Encounter addended by: Bernell List on: 06/05/2013 12:09 PM<BR>     Documentation filed: Charges VN

## 2013-06-08 ENCOUNTER — Ambulatory Visit
Admission: RE | Admit: 2013-06-08 | Discharge: 2013-06-08 | Disposition: A | Discharge status: Home / Self Care | Payer: Medicare Other | Source: Ambulatory Visit | Attending: Radiation Oncology | Admitting: Radiation Oncology

## 2013-06-08 ENCOUNTER — Ambulatory Visit: Payer: Medicare Other

## 2013-06-09 ENCOUNTER — Ambulatory Visit: Payer: Medicare Other

## 2013-06-09 ENCOUNTER — Ambulatory Visit
Admission: RE | Admit: 2013-06-09 | Discharge: 2013-06-09 | Disposition: A | Discharge status: Home / Self Care | Payer: Medicare Other | Source: Ambulatory Visit | Attending: Radiation Oncology | Admitting: Radiation Oncology

## 2013-06-09 VITALS — BP 215/74 | HR 53 | Temp 98.6°F | Ht 64.0 in | Wt 146.5 lb

## 2013-06-09 DIAGNOSIS — C8589 Other specified types of non-Hodgkin lymphoma, extranodal and solid organ sites: Secondary | ICD-10-CM

## 2013-06-09 NOTE — Progress Notes (Signed)
[facility]    Radiation Oncology [address] [place]     Maryln Gottron, M.D. [place], [place] [address]               Billie Lade, M.D., Ph.D. Phone: [phone]      Molli Hazard A. Kathrynn Running, M.D. Fax: [phone]      Radene Gunning, M.D., Ph.D.         Lurline Hare, M.D.         Grayland Jack, M.D Weekly Treatment Management Note  Name: NYOKA ALCOSER     MRN: [id number]        CSN: [id number] Date: 06/09/2013      DOB: Aug 11, 1941  CC: [name], MD         Kevan Ny    Status: Outpatient  Diagnosis: The encounter diagnosis was Other malignant lymphomas, unspecified site, extranodal and solid organ sites.  Current Dose: 12.5 Gy  Current Fraction: 5  Planned Dose: 35 Gy  Narrative: [name] was seen today for weekly treatment management. The chart was checked and CBCT  were reviewed. She is tolerating the treatments well at this time. She denies any soreness within her right mouth area.  Advicor; Crestor; Lipitor; Lovastatin; Morphine and related; Niaspan; Nsaids; Pravachol; Sulfa antibiotics; Vytorin; Welchol; Zetia; and Zocor Current Outpatient Prescriptions  Medication Sig Dispense Refill  . amLODipine (NORVASC) 2.5 MG tablet Take 2.5 mg by mouth daily.      . cholecalciferol (VITAMIN D) 1000 UNITS tablet Take 2,000 Units by mouth daily.        . cloNIDine (CATAPRES) 0.1 MG tablet Take 0.1 mg by mouth 2 (two) times daily.       Marland Kitchen docusate sodium 100 MG CAPS Take 200 mg by mouth 3 (three) times daily as needed for constipation.  60 capsule  2  . esomeprazole (NEXIUM) 40 MG capsule Take 40 mg by mouth daily.        Marland Kitchen lidocaine-prilocaine (EMLA) cream Apply 1 application topically as needed (for port-a-cath access).      . metoprolol (LOPRESSOR) 50 MG tablet Take 50 mg by mouth 2 (two) times daily.      . ondansetron (ZOFRAN-ODT) 8 MG disintegrating tablet Take 8 mg by mouth every 8 (eight) hours as needed for nausea.       . polyethylene  glycol powder (MIRALAX) powder Take 17 g by mouth as needed. Pt will start taking daily.      . Polyvinyl Alcohol-Povidone (CLEAR EYES ALL SEASONS OP) Apply to eye.      . prochlorperazine (COMPAZINE) 10 MG tablet Take 10 mg by mouth every 8 (eight) hours as needed (for nausea).       . traMADol (ULTRAM) 50 MG tablet Take 50-100 mg by mouth every 6 (six) hours as needed for pain.        No current facility-administered medications for this encounter.   Labs:  Lab Results  Component Value Date   WBC 5.4 05/22/2013   HGB 9.0* 05/22/2013   HCT 26.6* 05/22/2013   MCV 95 05/22/2013   PLT 232 05/22/2013   Lab Results  Component Value Date   CREATININE 5.9* 05/22/2013   BUN 45* 05/22/2013   NA 139 05/22/2013   K 4.6 05/22/2013   CL 105 05/22/2013   CO2 26 05/22/2013   Lab Results  Component Value Date   ALT 9* 05/22/2013   AST 14 05/22/2013   PHOS 3.3 02/13/2013   BILITOT  0.60 05/22/2013    Physical Examination:  height is 5\' 4"  (1.626 m) and weight is 146 lb 8 oz (66.452 kg). Her temperature is 98.6 F (37 C). Her blood pressure is 215/74 and her pulse is 53.    Wt Readings from Last 3 Encounters:  06/09/13 146 lb 8 oz (66.452 kg)  05/27/13 148 lb (67.132 kg)  05/22/13 147 lb (66.679 kg)    The right parotid mass seems to be somewhat softer and smaller on exam today. There is some erythema along the skin of the treatment area. Lungs - Normal respiratory effort, chest expands symmetrically. Lungs are clear to auscultation, no crackles or wheezes.  Heart has regular rhythm and rate  Abdomen is soft and non tender with normal bowel sounds  Assessment:  Patient tolerating treatments well  Plan: Continue treatment per original radiation prescription

## 2013-06-09 NOTE — Progress Notes (Signed)
[name] here for weekly under treat visit.  She has had 5 fractions to her parotid.  She does have discomfort in her abdomen that she is rating at a 4/10.  Her blood pressure today is 215/74.  She said she is upset over news from her kidney doctor.  She denies a dry mouth and ringing her ears.  She does have fatigue.  She also reports some trouble swallowing and getting food to go down.  The skin on the right side of her face is pink.  She is using radiaplex gel twice a day.

## 2013-06-10 ENCOUNTER — Ambulatory Visit
Admission: RE | Admit: 2013-06-10 | Discharge: 2013-06-10 | Disposition: A | Discharge status: Home / Self Care | Payer: Medicare Other | Source: Ambulatory Visit | Attending: Radiation Oncology | Admitting: Radiation Oncology

## 2013-06-10 ENCOUNTER — Ambulatory Visit: Payer: Medicare Other

## 2013-06-11 ENCOUNTER — Ambulatory Visit: Payer: Medicare Other

## 2013-06-11 ENCOUNTER — Encounter: Payer: Self-pay | Admitting: Hematology & Oncology

## 2013-06-11 ENCOUNTER — Ambulatory Visit: Payer: Medicare Other | Admitting: Radiation Oncology

## 2013-06-12 ENCOUNTER — Ambulatory Visit: Payer: Medicare Other

## 2013-06-12 ENCOUNTER — Other Ambulatory Visit (HOSPITAL_BASED_OUTPATIENT_CLINIC_OR_DEPARTMENT_OTHER): Payer: Medicare Other | Admitting: Lab

## 2013-06-12 ENCOUNTER — Ambulatory Visit (HOSPITAL_BASED_OUTPATIENT_CLINIC_OR_DEPARTMENT_OTHER): Payer: Medicare Other

## 2013-06-12 ENCOUNTER — Other Ambulatory Visit: Payer: Self-pay | Admitting: Hematology & Oncology

## 2013-06-12 ENCOUNTER — Ambulatory Visit
Admission: RE | Admit: 2013-06-12 | Discharge: 2013-06-12 | Disposition: A | Discharge status: Home / Self Care | Payer: Medicare Other | Source: Ambulatory Visit | Attending: Radiation Oncology | Admitting: Radiation Oncology

## 2013-06-12 VITALS — BP 158/70 | HR 47 | Temp 97.9°F

## 2013-06-12 DIAGNOSIS — C8589 Other specified types of non-Hodgkin lymphoma, extranodal and solid organ sites: Secondary | ICD-10-CM

## 2013-06-12 DIAGNOSIS — R112 Nausea with vomiting, unspecified: Secondary | ICD-10-CM

## 2013-06-12 DIAGNOSIS — R5381 Other malaise: Secondary | ICD-10-CM

## 2013-06-12 DIAGNOSIS — C8330 Diffuse large B-cell lymphoma, unspecified site: Secondary | ICD-10-CM

## 2013-06-12 DIAGNOSIS — R110 Nausea: Secondary | ICD-10-CM

## 2013-06-12 DIAGNOSIS — R0602 Shortness of breath: Secondary | ICD-10-CM

## 2013-06-12 DIAGNOSIS — N039 Chronic nephritic syndrome with unspecified morphologic changes: Secondary | ICD-10-CM

## 2013-06-12 DIAGNOSIS — N189 Chronic kidney disease, unspecified: Secondary | ICD-10-CM

## 2013-06-12 DIAGNOSIS — N289 Disorder of kidney and ureter, unspecified: Secondary | ICD-10-CM

## 2013-06-12 LAB — CBC WITH DIFFERENTIAL (CANCER CENTER ONLY)
BASO#: 0.1 10*3/uL (ref 0.0–0.2)
EOS%: 3.9 % (ref 0.0–7.0)
HCT: 26.1 % — ABNORMAL LOW (ref 34.8–46.6)
HGB: 8.8 g/dL — ABNORMAL LOW (ref 11.6–15.9)
MCH: 32.6 pg (ref 26.0–34.0)
MCHC: 33.7 g/dL (ref 32.0–36.0)
MONO%: 18.8 % — ABNORMAL HIGH (ref 0.0–13.0)
NEUT%: 20 % — ABNORMAL LOW (ref 39.6–80.0)
RDW: 14.5 % (ref 11.1–15.7)

## 2013-06-12 LAB — CMP (CANCER CENTER ONLY)
AST: 12 U/L (ref 11–38)
Alkaline Phosphatase: 46 U/L (ref 26–84)
BUN, Bld: 49 mg/dL — ABNORMAL HIGH (ref 7–22)
Creat: 6.4 mg/dl (ref 0.6–1.2)
Total Bilirubin: 0.6 mg/dl (ref 0.20–1.60)

## 2013-06-12 MED ORDER — SODIUM CHLORIDE 0.9 % IV SOLN
Freq: Once | INTRAVENOUS | Status: AC
  Administered 2013-06-12: 11:00:00 via INTRAVENOUS

## 2013-06-12 MED ORDER — DARBEPOETIN ALFA-POLYSORBATE 300 MCG/0.6ML IJ SOLN
300.0000 ug | Freq: Once | INTRAMUSCULAR | Status: AC
  Administered 2013-06-12: 300 ug via SUBCUTANEOUS

## 2013-06-12 MED ORDER — SODIUM CHLORIDE 0.9 % IV SOLN: 250.0000 mL | Freq: Once | INTRAVENOUS | Status: DC

## 2013-06-12 NOTE — Patient Instructions (Addendum)
Dehydration, Adult Dehydration is when you lose more fluids from the body than you take in. Vital organs like the kidneys, brain, and heart cannot function without a proper amount of fluids and salt. Any loss of fluids from the body can cause dehydration.  CAUSES   Vomiting.  Diarrhea.  Excessive sweating.  Excessive urine output.  Fever. SYMPTOMS  Mild dehydration  Thirst.  Dry lips.  Slightly dry mouth. Moderate dehydration  Very dry mouth.  Sunken eyes.  Skin does not bounce back quickly when lightly pinched and released.  Dark urine and decreased urine production.  Decreased tear production.  Headache. Severe dehydration  Very dry mouth.  Extreme thirst.  Rapid, weak pulse (more than 100 beats per minute at rest).  Cold hands and feet.  Not able to sweat in spite of heat and temperature.  Rapid breathing.  Blue lips.  Confusion and lethargy.  Difficulty being awakened.  Minimal urine production.  No tears. DIAGNOSIS  Your caregiver will diagnose dehydration based on your symptoms and your exam. Blood and urine tests will help confirm the diagnosis. The diagnostic evaluation should also identify the cause of dehydration. TREATMENT  Treatment of mild or moderate dehydration can often be done at home by increasing the amount of fluids that you drink. It is best to drink small amounts of fluid more often. Drinking too much at one time can make vomiting worse. Refer to the home care instructions below. Severe dehydration needs to be treated at the hospital where you will probably be given intravenous (IV) fluids that contain water and electrolytes. HOME CARE INSTRUCTIONS   Ask your caregiver about specific rehydration instructions.  Drink enough fluids to keep your urine clear or pale yellow.  Drink small amounts frequently if you have nausea and vomiting.  Eat as you normally do.  Avoid:  Foods or drinks high in sugar.  Carbonated  drinks.  Juice.  Extremely hot or cold fluids.  Drinks with caffeine.  Fatty, greasy foods.  Alcohol.  Tobacco.  Overeating.  Gelatin desserts.  Wash your hands well to avoid spreading bacteria and viruses.  Only take over-the-counter or prescription medicines for pain, discomfort, or fever as directed by your caregiver.  Ask your caregiver if you should continue all prescribed and over-the-counter medicines.  Keep all follow-up appointments with your caregiver. SEEK MEDICAL CARE IF:  You have abdominal pain and it increases or stays in one area (localizes).  You have a rash, stiff neck, or severe headache.  You are irritable, sleepy, or difficult to awaken.  You are weak, dizzy, or extremely thirsty. SEEK IMMEDIATE MEDICAL CARE IF:   You are unable to keep fluids down or you get worse despite treatment.  You have frequent episodes of vomiting or diarrhea.  You have blood or green matter (bile) in your vomit.  You have blood in your stool or your stool looks black and tarry.  You have not urinated in 6 to 8 hours, or you have only urinated a small amount of very dark urine.  You have a fever.  You faint. MAKE SURE YOU:   Understand these instructions.  Will watch your condition.  Will get help right away if you are not doing well or get worse. Document Released: 10/15/2005 Document Revised: 01/07/2012 Document Reviewed: 06/04/2011 [facility] Patient Information 2014 [place], [place]. Darbepoetin Alfa injection What is this medicine? DARBEPOETIN ALFA (dar be POE e tin AL fa) helps your body make more red blood cells. It is used to  treat anemia caused by chronic kidney failure and chemotherapy. This medicine may be used for other purposes; ask your health care provider or pharmacist if you have questions. What should I tell my health care provider before I take this medicine? They need to know if you have any of these conditions: -blood clotting disorders or  history of blood clots -cancer patient not on chemotherapy -cystic fibrosis -heart disease, such as angina, heart failure, or a history of a heart attack -hemoglobin level of 12 g/dL or greater -high blood pressure -low levels of folate, iron, or vitamin B12 -seizures -an unusual or allergic reaction to darbepoetin, erythropoietin, albumin, hamster proteins, latex, other medicines, foods, dyes, or preservatives -pregnant or trying to get pregnant -breast-feeding How should I use this medicine? This medicine is for injection into a vein or under the skin. It is usually given by a health care professional in a hospital or clinic setting. If you get this medicine at home, you will be taught how to prepare and give this medicine. Do not shake the solution before you withdraw a dose. Use exactly as directed. Take your medicine at regular intervals. Do not take your medicine more often than directed. It is important that you put your used needles and syringes in a special sharps container. Do not put them in a trash can. If you do not have a sharps container, call your pharmacist or healthcare provider to get one. Talk to your pediatrician regarding the use of this medicine in children. While this medicine may be used in children as young as 1 year for selected conditions, precautions do apply. Overdosage: If you think you have taken too much of this medicine contact a poison control center or emergency room at once. NOTE: This medicine is only for you. Do not share this medicine with others. What if I miss a dose? If you miss a dose, take it as soon as you can. If it is almost time for your next dose, take only that dose. Do not take double or extra doses. What may interact with this medicine? Do not take this medicine with any of the following medications: -epoetin alfa This list may not describe all possible interactions. Give your health care provider a list of all the medicines, herbs,  non-prescription drugs, or dietary supplements you use. Also tell them if you smoke, drink alcohol, or use illegal drugs. Some items may interact with your medicine. What should I watch for while using this medicine? Visit your prescriber or health care professional for regular checks on your progress and for the needed blood tests and blood pressure measurements. It is especially important for the doctor to make sure your hemoglobin level is in the desired range, to limit the risk of potential side effects and to give you the best benefit. Keep all appointments for any recommended tests. Check your blood pressure as directed. Ask your doctor what your blood pressure should be and when you should contact him or her. As your body makes more red blood cells, you may need to take iron, folic acid, or vitamin B supplements. Ask your doctor or health care provider which products are right for you. If you have kidney disease continue dietary restrictions, even though this medication can make you feel better. Talk with your doctor or health care professional about the foods you eat and the vitamins that you take. What side effects may I notice from receiving this medicine? Side effects that you should report to your  doctor or health care professional as soon as possible: -allergic reactions like skin rash, itching or hives, swelling of the face, lips, or tongue -breathing problems -changes in vision -chest pain -confusion, trouble speaking or understanding -feeling faint or lightheaded, falls -high blood pressure -muscle aches or pains -pain, swelling, warmth in the leg -rapid weight gain -severe headaches -sudden numbness or weakness of the face, arm or leg -trouble walking, dizziness, loss of balance or coordination -seizures (convulsions) -swelling of the ankles, feet, hands -unusually weak or tired Side effects that usually do not require medical attention (report to your doctor or health care  professional if they continue or are bothersome): -diarrhea -fever, chills (flu-like symptoms) -headaches -nausea, vomiting -redness, stinging, or swelling at site where injected This list may not describe all possible side effects. Call your doctor for medical advice about side effects. You may report side effects to FDA at 1-800-FDA-1088. Where should I keep my medicine? Keep out of the reach of children. Store in a refrigerator between 2 and 8 degrees C (36 and 46 degrees F). Do not freeze. Do not shake. Throw away any unused portion if using a single-dose vial. Throw away any unused medicine after the expiration date. NOTE: This sheet is a summary. It may not cover all possible information. If you have questions about this medicine, talk to your doctor, pharmacist, or health care provider.  2013, [employer]vier/[employer] Standard. (09/28/2008 10:23:57 AM)

## 2013-06-13 NOTE — Progress Notes (Signed)
DIAGNOSIS: 1. Recurrent diffuse large cell non-Hodgkin lymphoma. 2. 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 3. Progressive renal insufficiency.  CURRENT THERAPY:  Patient receiving radiation therapy for right preauricular recurrence.  INTERIM HISTORY:  [name] came in for an unscheduled visit.  She was in the treatment area.  She was not feeling well.  She had some salad at a restaurant a day or so ago.  She had a lot of vomiting after this.  She is getting radiation therapy to this preauricular recurrence.  She is doing well with this.  She has already noticed a response.  She has not yet started dialysis.  I spoke with Dr. Lowell Guitar of [facility].  He will get her in next week to move the process along.  I think that a lot of her fatigue and nausea and weakness is because of her impending renal failure.  She has not had any chest pain.  She is short of breath.  She really cannot do a whole lot before she gets short of breath.  She has had no diarrhea.  She is constipated.  PHYSICAL EXAMINATION:  Vital Signs:  Today, temperature of 97.9, pulse of 47, respiratory rate 18, blood pressure 158/70.  Lungs:  Clear bilaterally.  There are no wheezes.  Cardiac:  Slow but regular.  She has a 1/6 systolic ejection murmur.  There is no friction rub.  Abdomen: Soft.  There is no tenderness in the abdomen.  There is no palpable hepatosplenomegaly.  Extremities:  Show no clubbing, cyanosis, or edema. Skin:  Shows some dry skin.  There is no ecchymoses.  Neurological:  No focal neurological deficits.  LABORATORIES STUDIES:  White cell count 3.8, hemoglobin 8.8, hematocrit 26.1, platelet count 232.  BUN 49, creatinine 6.4.  Potassium 3.8. Calcium 9.3 with an albumin of 3.3.  IMPRESSION:  [name] is a 72 year old female with recurrent large cell lymphoma.  Again, recurrence is in the right preauricular region. She is getting radiation to this.  She is responding  nicely.  Again, I believe that her major issue is the renal insufficiency with potential uremic issues.  I really believe that dialysis will help her out.  I do not see any contraindications to her having dialysis with radiation.  Again, I spoke with Dr. Providence Lanius of [facility].  He will be getting her in early next week.  We will have Ms. Enrico keep her appointment with me next week.    ______________________________ Josph Macho, M.D. PRE/MEDQ  D:  06/12/2013  T:  06/13/2013  Job:  [id number]

## 2013-06-15 ENCOUNTER — Ambulatory Visit: Payer: Medicare Other

## 2013-06-15 ENCOUNTER — Ambulatory Visit
Admission: RE | Admit: 2013-06-15 | Discharge: 2013-06-15 | Disposition: A | Discharge status: Home / Self Care | Payer: Medicare Other | Source: Ambulatory Visit | Attending: Radiation Oncology | Admitting: Radiation Oncology

## 2013-06-16 ENCOUNTER — Ambulatory Visit: Payer: Medicare Other | Admitting: Hematology & Oncology

## 2013-06-16 ENCOUNTER — Telehealth: Payer: Self-pay | Admitting: Hematology & Oncology

## 2013-06-16 ENCOUNTER — Ambulatory Visit
Admission: RE | Admit: 2013-06-16 | Discharge: 2013-06-16 | Disposition: A | Discharge status: Home / Self Care | Payer: Medicare Other | Source: Ambulatory Visit | Attending: Radiation Oncology | Admitting: Radiation Oncology

## 2013-06-16 ENCOUNTER — Other Ambulatory Visit: Payer: Medicare Other | Admitting: Lab

## 2013-06-16 ENCOUNTER — Encounter: Payer: Self-pay | Admitting: Radiation Oncology

## 2013-06-16 VITALS — BP 152/70 | HR 57 | Temp 98.4°F | Resp 20 | Wt 139.3 lb

## 2013-06-16 DIAGNOSIS — C8589 Other specified types of non-Hodgkin lymphoma, extranodal and solid organ sites: Secondary | ICD-10-CM

## 2013-06-16 NOTE — Telephone Encounter (Signed)
Patient's daughter called and cx 06/16/13 apt and resch for 07/14/13

## 2013-06-16 NOTE — Progress Notes (Signed)
[facility]    Radiation Oncology [address] [place]     Maryln Gottron, M.D. [place], [place] [address]               Billie Lade, M.D., Ph.D. Phone: [phone]      Molli Hazard A. Kathrynn Running, M.D. Fax: [phone]      Radene Gunning, M.D., Ph.D.         Lurline Hare, M.D.         Grayland Jack, M.D Weekly Treatment Management Note  Name: [name]     MRN: [id number]        CSN: [id number] Date: 06/16/2013      DOB: [birth year]  CC: [name], MD         Kevan Ny    Status: Outpatient  Diagnosis: The encounter diagnosis was Other malignant lymphomas, unspecified site, extranodal and solid organ sites.  Current Dose: 22.5 Gy  Current Fraction: 9  Planned Dose: 35 Gy  Narrative: [name] was seen today for weekly treatment management. The chart was checked and CBCT  were reviewed. She is tolerating her treatments well at this time. She denies any itching or discomfort along the right face area. Patient is scheduled for placement of a dialysis catheter later today.  The patient's creatinine late last week was 6.4.  She denies any soreness in her right mouth.  Advicor; Crestor; Lipitor; Lovastatin; Morphine and related; Niaspan; Nsaids; Pravachol; Sulfa antibiotics; Vytorin; Welchol; Zetia; and Zocor Current Outpatient Prescriptions  Medication Sig Dispense Refill  . amLODipine (NORVASC) 2.5 MG tablet Take 2.5 mg by mouth daily.      . cholecalciferol (VITAMIN D) 1000 UNITS tablet Take 2,000 Units by mouth daily.        . cloNIDine (CATAPRES) 0.1 MG tablet Take 0.1 mg by mouth 2 (two) times daily.       Marland Kitchen docusate sodium 100 MG CAPS Take 200 mg by mouth 3 (three) times daily as needed for constipation.  60 capsule  2  . esomeprazole (NEXIUM) 40 MG capsule Take 40 mg by mouth daily.        Marland Kitchen lidocaine-prilocaine (EMLA) cream Apply 1 application topically as needed (for port-a-cath access).      . metoprolol (LOPRESSOR) 50 MG tablet Take 50 mg  by mouth 2 (two) times daily.      . ondansetron (ZOFRAN-ODT) 8 MG disintegrating tablet Take 8 mg by mouth every 8 (eight) hours as needed for nausea.       . polyethylene glycol powder (MIRALAX) powder Take 17 g by mouth as needed. Pt will start taking daily.      . Polyvinyl Alcohol-Povidone (CLEAR EYES ALL SEASONS OP) Apply to eye.      . prochlorperazine (COMPAZINE) 10 MG tablet Take 10 mg by mouth every 8 (eight) hours as needed (for nausea).       . traMADol (ULTRAM) 50 MG tablet Take 50-100 mg by mouth every 6 (six) hours as needed for pain.        No current facility-administered medications for this encounter.   Labs:  Lab Results  Component Value Date   WBC 3.8* 06/12/2013   HGB 8.8* 06/12/2013   HCT 26.1* 06/12/2013   MCV 97 06/12/2013   PLT 232 06/12/2013   Lab Results  Component Value Date   CREATININE 6.4* 06/12/2013   BUN 49* 06/12/2013   NA 137 06/12/2013   K 3.8 06/12/2013   CL 100 06/12/2013  CO2 27 06/12/2013   Lab Results  Component Value Date   ALT 10 06/12/2013   AST 12 06/12/2013   PHOS 3.3 02/13/2013   BILITOT 0.60 06/12/2013    Physical Examination:  weight is 139 lb 4.8 oz (63.186 kg). Her temperature is 98.4 F (36.9 C). Her blood pressure is 152/70 and her pulse is 57. Her respiration is 20 and oxygen saturation is 100%.    Wt Readings from Last 3 Encounters:  06/16/13 139 lb 4.8 oz (63.186 kg)  06/09/13 146 lb 8 oz (66.452 kg)  05/27/13 148 lb (67.132 kg)    The right preauricular mass has flattened out significantly. There is mild erythema in the skin of the radiation portal. Lungs - Normal respiratory effort, chest expands symmetrically. Lungs are clear to auscultation, no crackles or wheezes.  Heart has regular rhythm and rate  Abdomen is soft and non tender with normal bowel sounds  Assessment:  Patient tolerating treatments well  Plan: Continue treatment per original radiation prescription

## 2013-06-16 NOTE — Progress Notes (Signed)
Pt had appointment w/Dr Myna Hidalgo this morning, states she had to cancel due to missing radiation treatment. Advised her she can call this dept and possibly come for radiation at a later time of day if she has another conflict. Will inform linac 1 to also be aware of this issue due to pt's  med onc office located in [facility].  Pt c/o dizziness x 2 days, denies HA, does have occasional nausea, no vomiting. She takes Zofran prn w/good relief. Pt  States she "has a stomach ache today", this occurs off and on. If she has persistent "stomach ache" she takes Zofran. She states she feels she is eating same amount of foods. Pt denies difficulty w/swallowing except w/breads.  Pt states she is having surgery today at 2 pm because "she is in kidney failure". She states she will have to begin dialysis.

## 2013-06-17 ENCOUNTER — Ambulatory Visit: Payer: Medicare Other

## 2013-06-18 ENCOUNTER — Ambulatory Visit
Admission: RE | Admit: 2013-06-18 | Discharge: 2013-06-18 | Disposition: A | Discharge status: Home / Self Care | Payer: Medicare Other | Source: Ambulatory Visit | Attending: Radiation Oncology | Admitting: Radiation Oncology

## 2013-06-19 ENCOUNTER — Ambulatory Visit
Admission: RE | Admit: 2013-06-19 | Discharge: 2013-06-19 | Disposition: A | Discharge status: Home / Self Care | Payer: Medicare Other | Source: Ambulatory Visit | Attending: Radiation Oncology | Admitting: Radiation Oncology

## 2013-06-22 ENCOUNTER — Ambulatory Visit
Admission: RE | Admit: 2013-06-22 | Discharge: 2013-06-22 | Disposition: A | Discharge status: Home / Self Care | Payer: Medicare Other | Source: Ambulatory Visit | Attending: Radiation Oncology | Admitting: Radiation Oncology

## 2013-06-22 ENCOUNTER — Ambulatory Visit: Payer: Medicare Other

## 2013-06-22 IMAGING — CR DG CHEST 2V
2 series · 2 of 2 positions shown · non-contrast
Comparison: 03/05/2013

CLINICAL DATA: Shortness of breath and left-sided chest pain.
History of lymphoma.

CHEST - 2 VIEW

[x chest ap]
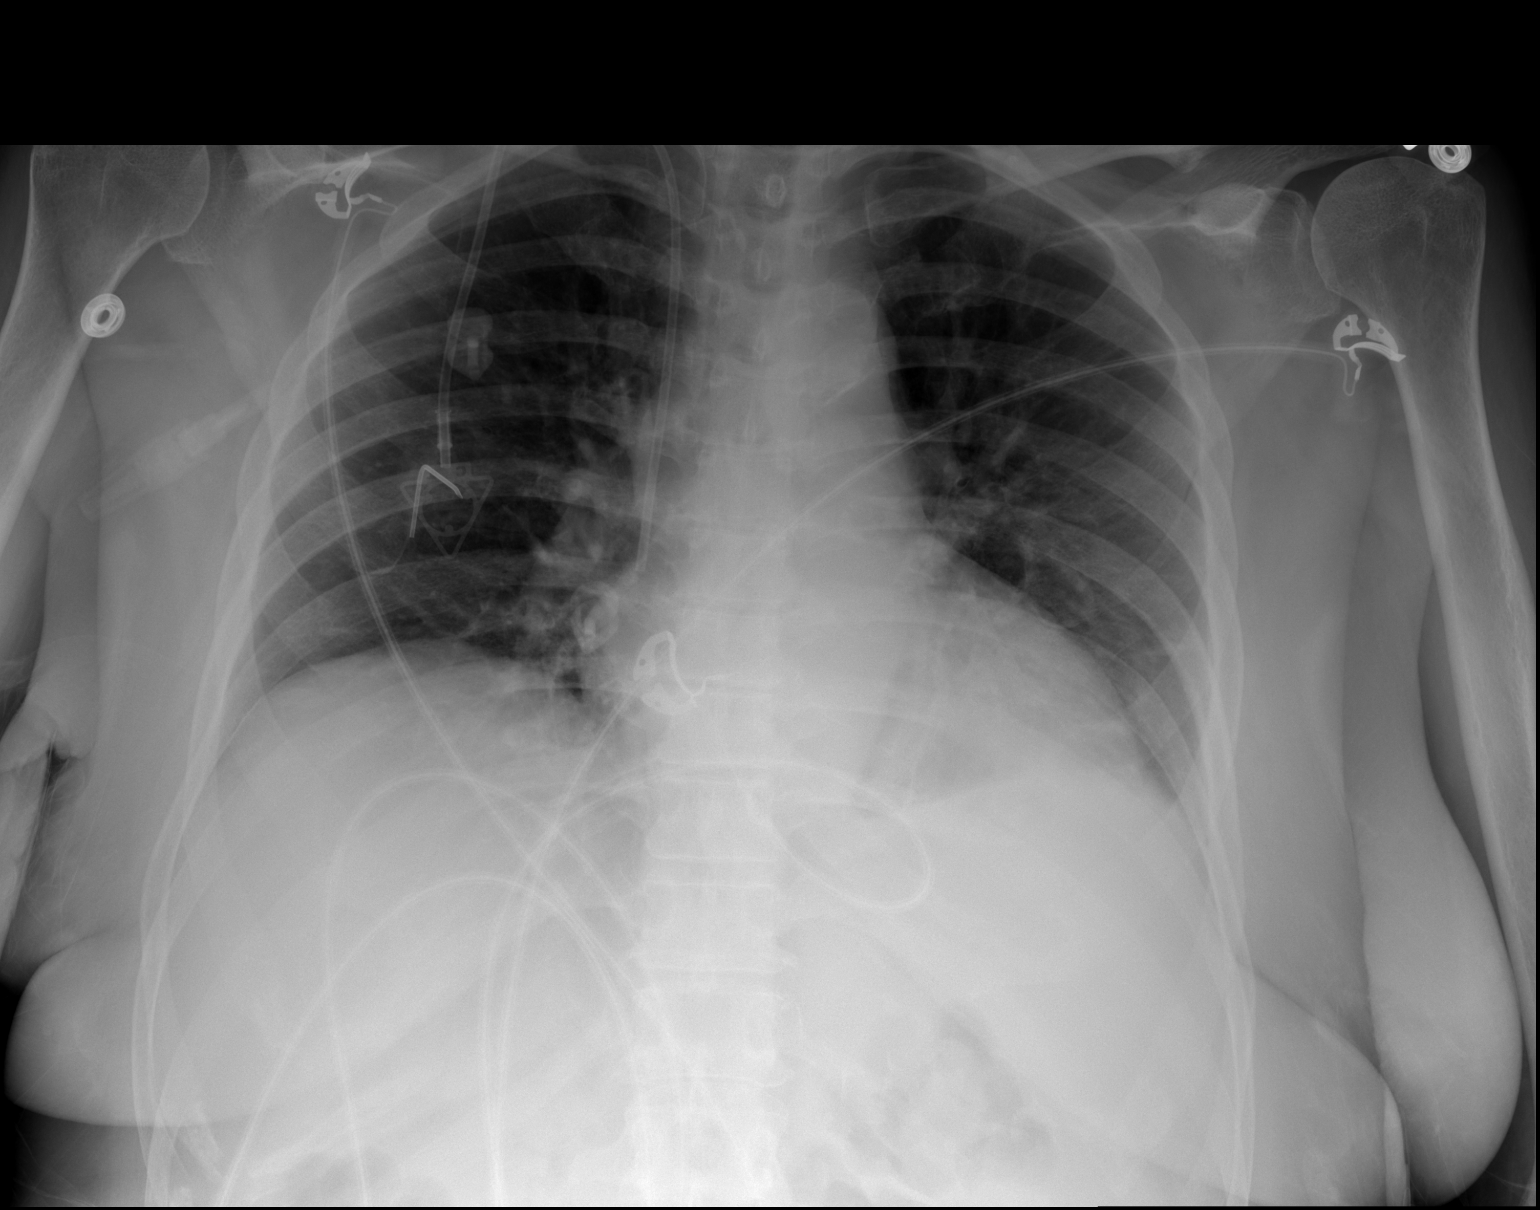

[w chest lat]
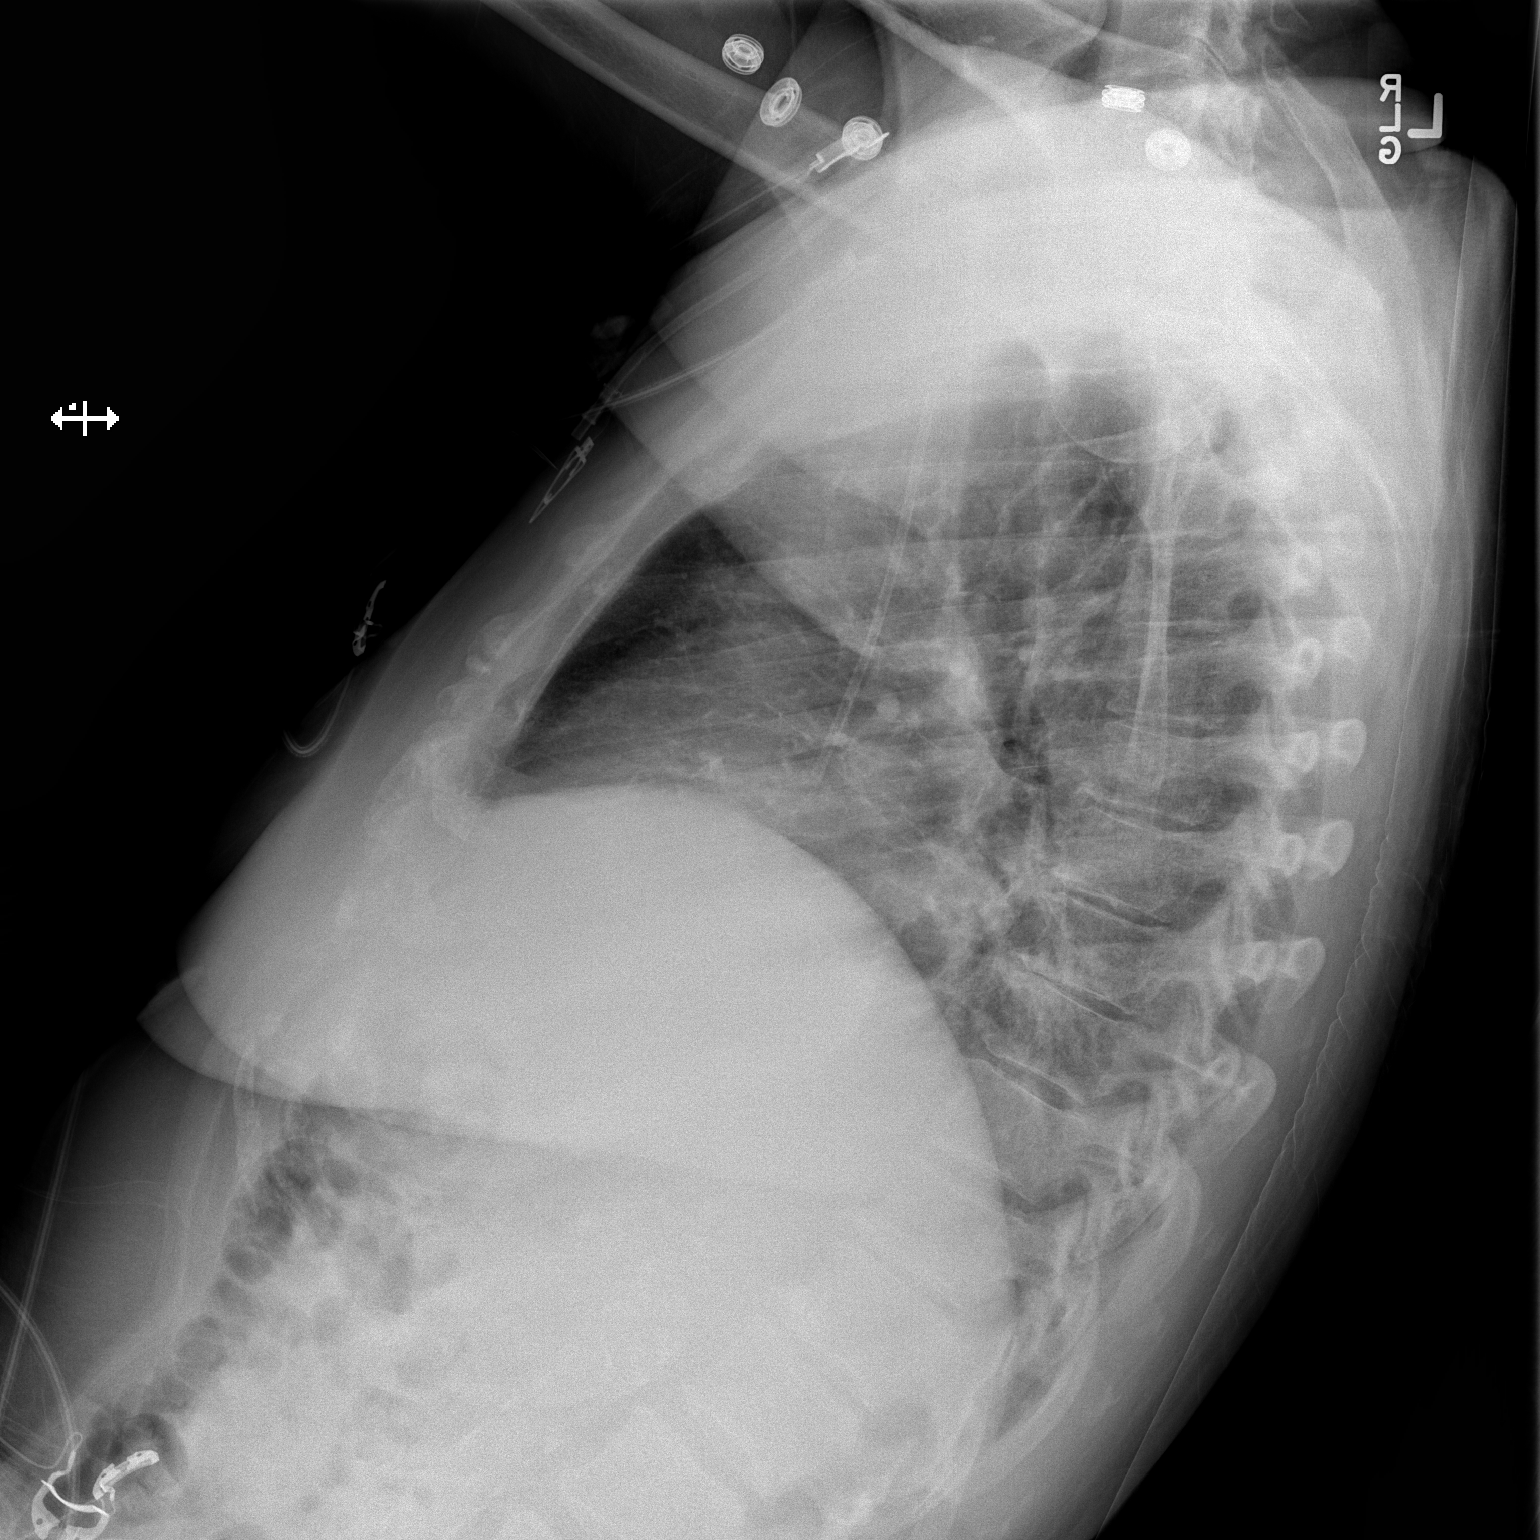

[2 of 2 positions shown; findings below may reference images not displayed]

FINDINGS: Stable positioning of Port-A-Cath.  Lungs show low
volumes with bibasilar atelectasis.  No focal infiltrate, pulmonary
edema or pleural fluid is identified.  Heart size and mediastinal
contours are within normal limits.  Visualized bony structures are
unremarkable.
IMPRESSION: No active disease.  Low lung volumes with bibasilar atelectasis.

## 2013-06-23 ENCOUNTER — Ambulatory Visit
Admission: RE | Admit: 2013-06-23 | Discharge: 2013-06-23 | Disposition: A | Discharge status: Home / Self Care | Payer: Medicare Other | Source: Ambulatory Visit | Attending: Radiation Oncology | Admitting: Radiation Oncology

## 2013-06-23 VITALS — BP 168/79 | HR 65 | Temp 98.4°F | Resp 20 | Wt 136.3 lb

## 2013-06-23 DIAGNOSIS — C8589 Other specified types of non-Hodgkin lymphoma, extranodal and solid organ sites: Secondary | ICD-10-CM

## 2013-06-23 NOTE — Progress Notes (Signed)
Pt states she began dialysis today, will have this procedure every Tues, Thurs, Sat. She states the dialysis port site on her upper left chest "is sore". She denies other pain. Pt is fatigued, has loss of appetite. She states there are certain foods she cannot eat like breads.

## 2013-06-23 NOTE — Progress Notes (Signed)
[facility]    Radiation Oncology [address] [place]     Maryln Gottron, M.D. [place], [place] [address]               Billie Lade, M.D., Ph.D. Phone: [phone]      Molli Hazard A. Kathrynn Running, M.D. Fax: [phone]      Radene Gunning, M.D., Ph.D.         Lurline Hare, M.D.         Grayland Jack, M.D Weekly Treatment Management Note  Name: [name]     MRN: [id number]        CSN: [id number] Date: 06/23/2013      DOB: [birth year]  CC: [name], MD         Kevan Ny    Status: Outpatient  Diagnosis: The encounter diagnosis was Other malignant lymphomas, unspecified site, extranodal and solid organ sites.  Current Dose: 32.5 Gy  Current Fraction: 13  Planned Dose: 35 Gy  Narrative: [name] was seen today for weekly treatment management. The chart was checked and CBCT  were reviewed. She is tolerating her treatments well. She is tired but attributes this to her first dialysis treatment earlier today. She denies any itching or discomfort along the right face area.  Advicor; Crestor; Lipitor; Lovastatin; Morphine and related; Niaspan; Nsaids; Pravachol; Sulfa antibiotics; Vytorin; Welchol; Zetia; and Zocor Current Outpatient Prescriptions  Medication Sig Dispense Refill  . amLODipine (NORVASC) 2.5 MG tablet Take 2.5 mg by mouth daily.      . cholecalciferol (VITAMIN D) 1000 UNITS tablet Take 2,000 Units by mouth daily.        . cloNIDine (CATAPRES) 0.1 MG tablet Take 0.1 mg by mouth 2 (two) times daily.       Marland Kitchen docusate sodium 100 MG CAPS Take 200 mg by mouth 3 (three) times daily as needed for constipation.  60 capsule  2  . esomeprazole (NEXIUM) 40 MG capsule Take 40 mg by mouth daily.        Marland Kitchen lidocaine-prilocaine (EMLA) cream Apply 1 application topically as needed (for port-a-cath access).      . metoprolol (LOPRESSOR) 50 MG tablet Take 50 mg by mouth 2 (two) times daily.      . ondansetron (ZOFRAN-ODT) 8 MG disintegrating tablet Take 8 mg  by mouth every 8 (eight) hours as needed for nausea.       . polyethylene glycol powder (MIRALAX) powder Take 17 g by mouth as needed. Pt will start taking daily.      . Polyvinyl Alcohol-Povidone (CLEAR EYES ALL SEASONS OP) Apply to eye.      . prochlorperazine (COMPAZINE) 10 MG tablet Take 10 mg by mouth every 8 (eight) hours as needed (for nausea).       . traMADol (ULTRAM) 50 MG tablet Take 50-100 mg by mouth every 6 (six) hours as needed for pain.        No current facility-administered medications for this encounter.   Labs:  Lab Results  Component Value Date   WBC 3.8* 06/12/2013   HGB 8.8* 06/12/2013   HCT 26.1* 06/12/2013   MCV 97 06/12/2013   PLT 232 06/12/2013   Lab Results  Component Value Date   CREATININE 6.4* 06/12/2013   BUN 49* 06/12/2013   NA 137 06/12/2013   K 3.8 06/12/2013   CL 100 06/12/2013   CO2 27 06/12/2013   Lab Results  Component Value Date   ALT 10 06/12/2013  AST 12 06/12/2013   PHOS 3.3 02/13/2013   BILITOT 0.60 06/12/2013    Physical Examination:  weight is 136 lb 4.8 oz (61.825 kg). Her temperature is 98.4 F (36.9 C). Her blood pressure is 168/79 and her pulse is 65. Her respiration is 20.    Wt Readings from Last 3 Encounters:  06/23/13 136 lb 4.8 oz (61.825 kg)  06/16/13 139 lb 4.8 oz (63.186 kg)  06/09/13 146 lb 8 oz (66.452 kg)    The right preauricular mass has flattened out completely at this time. No skin breakdown noted Lungs - Normal respiratory effort, chest expands symmetrically. Lungs are clear to auscultation, no crackles or wheezes.  Heart has regular rhythm and rate  Abdomen is soft and non tender with normal bowel sounds  Assessment:  Patient tolerating treatments well  Plan: Continue treatment per original radiation prescription

## 2013-06-24 ENCOUNTER — Ambulatory Visit
Admission: RE | Admit: 2013-06-24 | Discharge: 2013-06-24 | Disposition: A | Discharge status: Home / Self Care | Payer: Medicare Other | Source: Ambulatory Visit | Attending: Radiation Oncology | Admitting: Radiation Oncology

## 2013-06-30 ENCOUNTER — Telehealth: Payer: Self-pay | Admitting: Dietician

## 2013-06-30 ENCOUNTER — Encounter: Payer: Self-pay | Admitting: Radiation Oncology

## 2013-06-30 ENCOUNTER — Other Ambulatory Visit: Payer: Self-pay | Admitting: *Deleted

## 2013-06-30 DIAGNOSIS — Z01810 Encounter for preprocedural cardiovascular examination: Secondary | ICD-10-CM

## 2013-06-30 DIAGNOSIS — N186 End stage renal disease: Secondary | ICD-10-CM

## 2013-06-30 NOTE — Progress Notes (Signed)
  Radiation Oncology         [phone] ________________________________  Name: [name] MRN: [id number]  Date: 06/30/2013  DOB: Feb 16, 1941  End of Treatment Note  Diagnosis:   Recurrent lymphoma     Indication for treatment:  Progressive mass in the right parotid region and discomfort/pain       Radiation treatment dates:   August 6 through August 27  Site/dose:   Right parotid region 35 gray in 14 fractions  Beams/energy:   IMRT, 2 rapid arcs, 6 MV photons  Narrative: The patient tolerated radiation treatment relatively well.   She had some mild fatigue during the course of her treatment. The right parotid mass decreased significantly during her treatment.  Plan: The patient has completed radiation treatment. The patient will return to radiation oncology clinic for routine followup in one month. I advised them to call or return sooner if they have any questions or concerns related to their recovery or treatment.  -----------------------------------  Billie Lade, PhD, MD

## 2013-06-30 NOTE — Telephone Encounter (Signed)
Brief Outpatient Oncology Nutrition Note  Patient has been identified to be at risk on malnutrition screen.  Wt Readings from Last 10 Encounters:  06/23/13 136 lb 4.8 oz (61.825 kg)  06/16/13 139 lb 4.8 oz (63.186 kg)  06/09/13 146 lb 8 oz (66.452 kg)  05/27/13 148 lb (67.132 kg)  05/22/13 147 lb (66.679 kg)  05/05/13 149 lb (67.586 kg)  04/22/13 151 lb (68.493 kg)  04/14/13 151 lb 6.4 oz (68.675 kg)  04/09/13 149 lb (67.586 kg)  04/01/13 151 lb 11.2 oz (68.811 kg)   Patient with recurrent diffuse large cell non-Hodkin's lymphoma.  Renal disease on HD.  Patient just finished with XRT for mass in the right parotid region.    Called and spoke with patient's husband.  Patient is currently in HD.  Husband reports that patient is not eating well and will not drink the Nepro.  Discussed the importance of nutrition.  Husband requested I call back and speak with wife.  Oran Rein, RD, LDN

## 2013-07-01 ENCOUNTER — Telehealth: Payer: Self-pay | Admitting: Dietician

## 2013-07-01 NOTE — Telephone Encounter (Signed)
Brief Nutrition Note  Called and spoke with patient's husband yesterday who requested that I call back and speak with his wife.  Called and spoke with patient today.  Patient describes very poor appetite and nausea.  Unable to tolerate Ensure and Glucerna secondary to worsening nausea.  Tries to eat several times per day but states that she is not eating well.  Reviewed ways to increase calories (within renal guidelines).  Encouraged intake every 2-3 hours.  [facility] and also sees RD at HD center.  Encouraged patient to speak with [place] if she continues to not eat.  Oran Rein, RD, LDN

## 2013-07-04 IMAGING — CR DG CHEST 1V PORT
1 series · 1 of 1 positions shown · non-contrast
Comparison: 03/07/2013

CLINICAL DATA: Chest pain

PORTABLE CHEST - 1 VIEW

[AP]
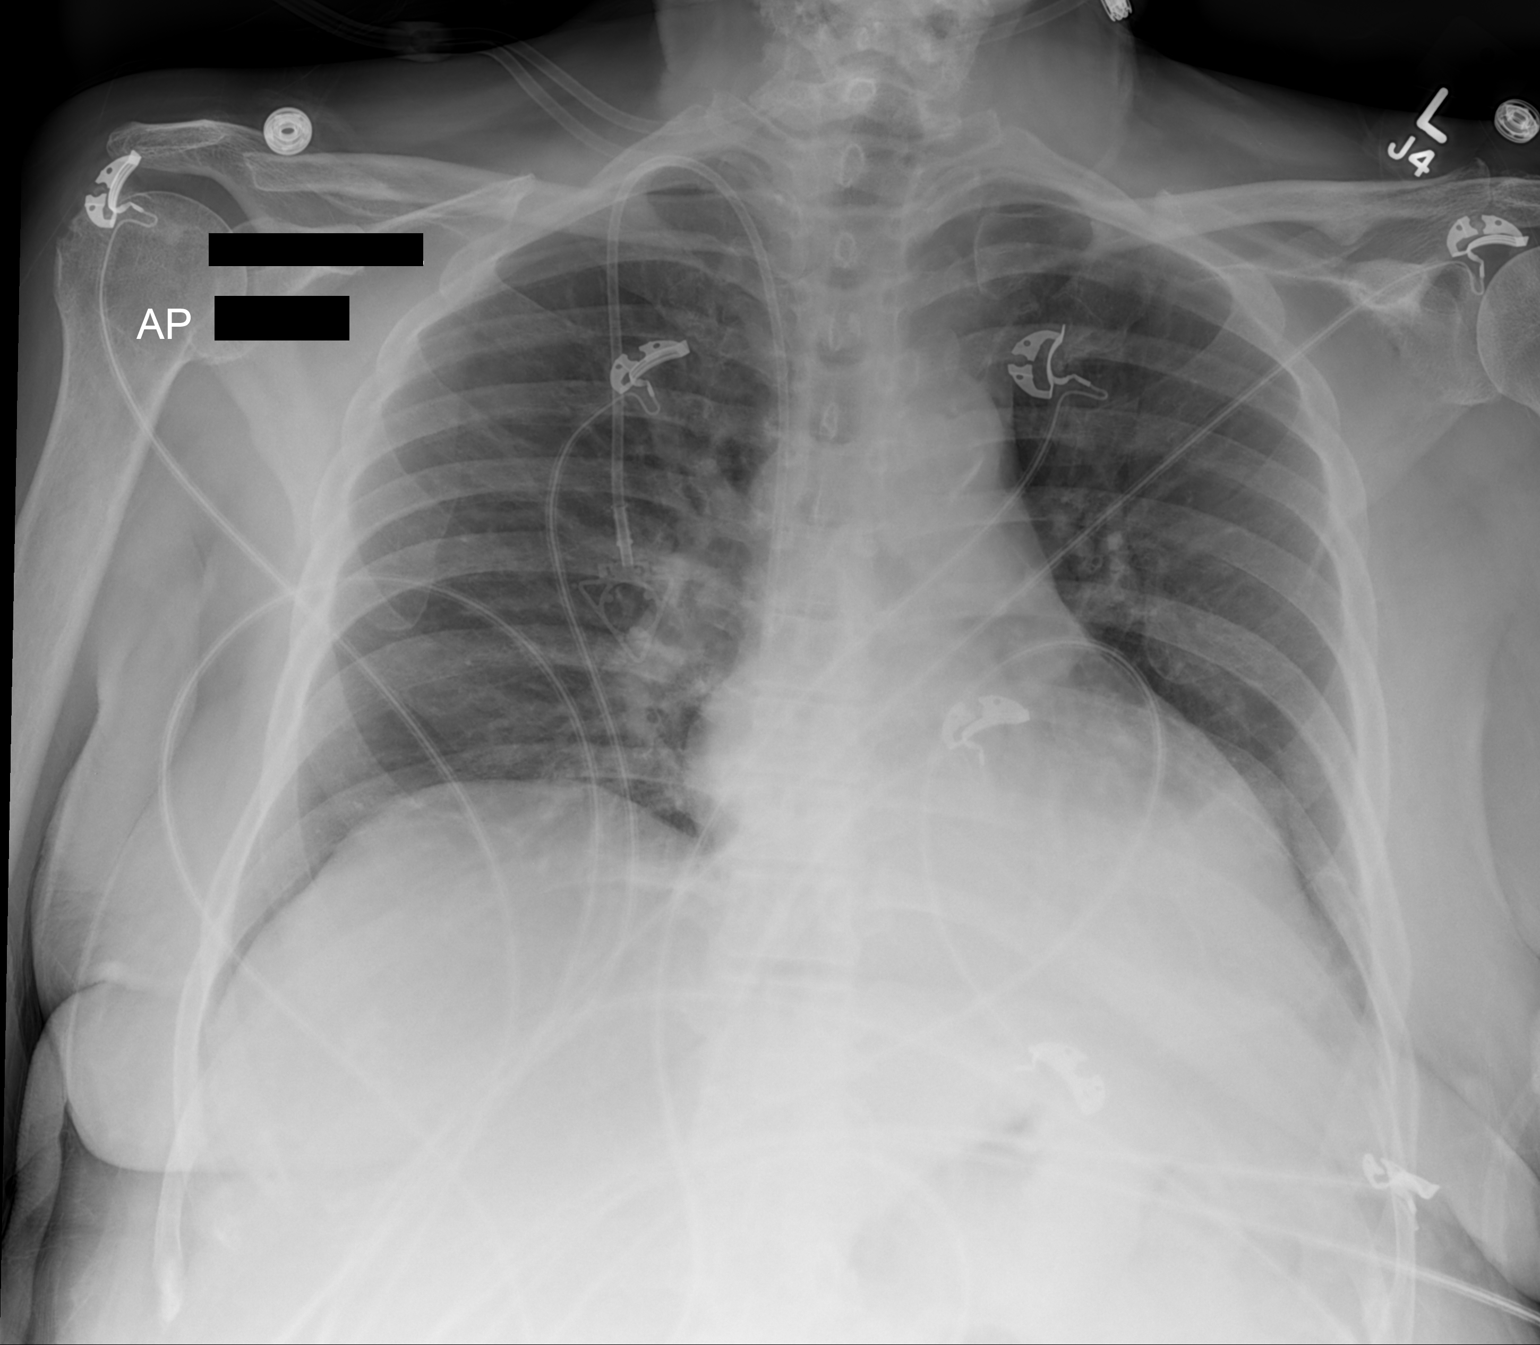

[1 of 1 positions shown; findings below may reference images not displayed]

FINDINGS: Right IJ power Port-A-Cath terminates over the distal
superior vena cava.  Stable cardiomediastinal silhouette.  There is
a focal opacity at the left lung base, with obscuration of the left
hemidiaphragm.  No visible pleural effusion. Negative for
pneumothorax.  The right lung is clear.  No acute osseous
abnormality identified. Chronic foreshortening of the distal right
clavicle may be postsurgical.
IMPRESSION: Left basilar opacity.  This could reflect atelectasis
or airspace disease.

## 2013-07-14 ENCOUNTER — Ambulatory Visit (HOSPITAL_BASED_OUTPATIENT_CLINIC_OR_DEPARTMENT_OTHER): Payer: Medicare Other | Admitting: Hematology & Oncology

## 2013-07-14 ENCOUNTER — Other Ambulatory Visit (HOSPITAL_BASED_OUTPATIENT_CLINIC_OR_DEPARTMENT_OTHER): Payer: Medicare Other | Admitting: Lab

## 2013-07-14 VITALS — BP 160/69 | HR 67 | Temp 98.6°F | Resp 14 | Ht 64.0 in | Wt 131.0 lb

## 2013-07-14 DIAGNOSIS — R64 Cachexia: Secondary | ICD-10-CM

## 2013-07-14 DIAGNOSIS — C8589 Other specified types of non-Hodgkin lymphoma, extranodal and solid organ sites: Secondary | ICD-10-CM

## 2013-07-14 DIAGNOSIS — C8330 Diffuse large B-cell lymphoma, unspecified site: Secondary | ICD-10-CM

## 2013-07-14 DIAGNOSIS — F410 Panic disorder [episodic paroxysmal anxiety] without agoraphobia: Secondary | ICD-10-CM

## 2013-07-14 DIAGNOSIS — D509 Iron deficiency anemia, unspecified: Secondary | ICD-10-CM

## 2013-07-14 LAB — CMP (CANCER CENTER ONLY)
Albumin: 3.4 g/dL (ref 3.3–5.5)
Alkaline Phosphatase: 48 U/L (ref 26–84)
BUN, Bld: 11 mg/dL (ref 7–22)
Calcium: 9.5 mg/dL (ref 8.0–10.3)
Glucose, Bld: 139 mg/dL — ABNORMAL HIGH (ref 73–118)
Potassium: 4.3 mEq/L (ref 3.3–4.7)

## 2013-07-14 LAB — CBC WITH DIFFERENTIAL (CANCER CENTER ONLY)
BASO#: 0 10*3/uL (ref 0.0–0.2)
EOS%: 5.1 % (ref 0.0–7.0)
HCT: 34.8 % (ref 34.8–46.6)
HGB: 11.4 g/dL — ABNORMAL LOW (ref 11.6–15.9)
LYMPH#: 2.6 10*3/uL (ref 0.9–3.3)
MCH: 34.5 pg — ABNORMAL HIGH (ref 26.0–34.0)
MCHC: 32.8 g/dL (ref 32.0–36.0)
MCV: 106 fL — ABNORMAL HIGH (ref 81–101)
MONO%: 13.3 % — ABNORMAL HIGH (ref 0.0–13.0)
NEUT%: 31.1 % — ABNORMAL LOW (ref 39.6–80.0)

## 2013-07-14 MED ORDER — ALPRAZOLAM 0.5 MG PO TABS: ORAL_TABLET | ORAL | Status: DC

## 2013-07-14 MED ORDER — MEGESTROL ACETATE 625 MG/5ML PO SUSP: 625.0000 mg | Freq: Every day | ORAL | Status: DC

## 2013-07-14 NOTE — Progress Notes (Signed)
This office note has been dictated.

## 2013-07-15 ENCOUNTER — Telehealth: Payer: Self-pay | Admitting: Hematology & Oncology

## 2013-07-15 NOTE — Telephone Encounter (Signed)
Pt aware of 9-24 CT and 10-7 MD

## 2013-07-16 ENCOUNTER — Other Ambulatory Visit: Payer: Self-pay

## 2013-07-16 ENCOUNTER — Encounter (HOSPITAL_COMMUNITY): Payer: Self-pay

## 2013-07-16 ENCOUNTER — Inpatient Hospital Stay (HOSPITAL_COMMUNITY)
Admission: EM | Admit: 2013-07-16 | Discharge: 2013-07-18 | DRG: 388 | Disposition: A | Discharge status: Home / Self Care | Payer: Medicare Other | Attending: Internal Medicine | Admitting: Internal Medicine

## 2013-07-16 ENCOUNTER — Encounter: Payer: Self-pay | Admitting: Vascular Surgery

## 2013-07-16 ENCOUNTER — Emergency Department (HOSPITAL_COMMUNITY): Payer: Medicare Other

## 2013-07-16 DIAGNOSIS — C8330 Diffuse large B-cell lymphoma, unspecified site: Secondary | ICD-10-CM

## 2013-07-16 DIAGNOSIS — R112 Nausea with vomiting, unspecified: Secondary | ICD-10-CM

## 2013-07-16 DIAGNOSIS — C801 Malignant (primary) neoplasm, unspecified: Secondary | ICD-10-CM

## 2013-07-16 DIAGNOSIS — Z794 Long term (current) use of insulin: Secondary | ICD-10-CM

## 2013-07-16 DIAGNOSIS — F40240 Claustrophobia: Secondary | ICD-10-CM

## 2013-07-16 DIAGNOSIS — N170 Acute kidney failure with tubular necrosis: Secondary | ICD-10-CM

## 2013-07-16 DIAGNOSIS — N182 Chronic kidney disease, stage 2 (mild): Secondary | ICD-10-CM

## 2013-07-16 DIAGNOSIS — Z923 Personal history of irradiation: Secondary | ICD-10-CM

## 2013-07-16 DIAGNOSIS — N186 End stage renal disease: Secondary | ICD-10-CM

## 2013-07-16 DIAGNOSIS — I4891 Unspecified atrial fibrillation: Secondary | ICD-10-CM

## 2013-07-16 DIAGNOSIS — N179 Acute kidney failure, unspecified: Secondary | ICD-10-CM

## 2013-07-16 DIAGNOSIS — D6489 Other specified anemias: Secondary | ICD-10-CM | POA: Diagnosis present

## 2013-07-16 DIAGNOSIS — D509 Iron deficiency anemia, unspecified: Secondary | ICD-10-CM

## 2013-07-16 DIAGNOSIS — K56609 Unspecified intestinal obstruction, unspecified as to partial versus complete obstruction: Secondary | ICD-10-CM

## 2013-07-16 DIAGNOSIS — D61810 Antineoplastic chemotherapy induced pancytopenia: Secondary | ICD-10-CM

## 2013-07-16 DIAGNOSIS — D631 Anemia in chronic kidney disease: Secondary | ICD-10-CM

## 2013-07-16 DIAGNOSIS — E43 Unspecified severe protein-calorie malnutrition: Secondary | ICD-10-CM

## 2013-07-16 DIAGNOSIS — C8589 Other specified types of non-Hodgkin lymphoma, extranodal and solid organ sites: Secondary | ICD-10-CM

## 2013-07-16 DIAGNOSIS — I120 Hypertensive chronic kidney disease with stage 5 chronic kidney disease or end stage renal disease: Secondary | ICD-10-CM | POA: Diagnosis present

## 2013-07-16 DIAGNOSIS — Z992 Dependence on renal dialysis: Secondary | ICD-10-CM

## 2013-07-16 DIAGNOSIS — K5909 Other constipation: Secondary | ICD-10-CM

## 2013-07-16 DIAGNOSIS — T451X5A Adverse effect of antineoplastic and immunosuppressive drugs, initial encounter: Secondary | ICD-10-CM

## 2013-07-16 DIAGNOSIS — Z87891 Personal history of nicotine dependence: Secondary | ICD-10-CM

## 2013-07-16 DIAGNOSIS — K5903 Drug induced constipation: Secondary | ICD-10-CM

## 2013-07-16 DIAGNOSIS — N189 Chronic kidney disease, unspecified: Secondary | ICD-10-CM

## 2013-07-16 DIAGNOSIS — E1110 Type 2 diabetes mellitus with ketoacidosis without coma: Secondary | ICD-10-CM

## 2013-07-16 DIAGNOSIS — R109 Unspecified abdominal pain: Secondary | ICD-10-CM

## 2013-07-16 DIAGNOSIS — F411 Generalized anxiety disorder: Secondary | ICD-10-CM | POA: Diagnosis present

## 2013-07-16 DIAGNOSIS — I252 Old myocardial infarction: Secondary | ICD-10-CM

## 2013-07-16 DIAGNOSIS — R630 Anorexia: Secondary | ICD-10-CM

## 2013-07-16 DIAGNOSIS — M129 Arthropathy, unspecified: Secondary | ICD-10-CM | POA: Diagnosis present

## 2013-07-16 DIAGNOSIS — E119 Type 2 diabetes mellitus without complications: Secondary | ICD-10-CM | POA: Diagnosis present

## 2013-07-16 DIAGNOSIS — E1010 Type 1 diabetes mellitus with ketoacidosis without coma: Secondary | ICD-10-CM | POA: Diagnosis present

## 2013-07-16 LAB — CBC WITH DIFFERENTIAL/PLATELET
Hemoglobin: 11.5 g/dL — ABNORMAL LOW (ref 12.0–15.0)
Lymphocytes Relative: 38 % (ref 12–46)
Lymphs Abs: 3.4 10*3/uL (ref 0.7–4.0)
MCH: 34.1 pg — ABNORMAL HIGH (ref 26.0–34.0)
Monocytes Relative: 9 % (ref 3–12)
Neutro Abs: 4.7 10*3/uL (ref 1.7–7.7)
Neutrophils Relative %: 52 % (ref 43–77)
RBC: 3.37 MIL/uL — ABNORMAL LOW (ref 3.87–5.11)

## 2013-07-16 LAB — POCT I-STAT, CHEM 8
BUN: 31 mg/dL — ABNORMAL HIGH (ref 6–23)
Chloride: 99 mEq/L (ref 96–112)
Creatinine, Ser: 6.6 mg/dL — ABNORMAL HIGH (ref 0.50–1.10)
Glucose, Bld: 207 mg/dL — ABNORMAL HIGH (ref 70–99)
Hemoglobin: 12.6 g/dL (ref 12.0–15.0)
Potassium: 4.5 mEq/L (ref 3.5–5.1)
Sodium: 134 mEq/L — ABNORMAL LOW (ref 135–145)

## 2013-07-16 LAB — CBC
MCHC: 33.3 g/dL (ref 30.0–36.0)
MCV: 101.3 fL — ABNORMAL HIGH (ref 78.0–100.0)
Platelets: 133 10*3/uL — ABNORMAL LOW (ref 150–400)
RDW: 16.1 % — ABNORMAL HIGH (ref 11.5–15.5)
WBC: 7.3 10*3/uL (ref 4.0–10.5)

## 2013-07-16 LAB — GLUCOSE, CAPILLARY

## 2013-07-16 LAB — COMPREHENSIVE METABOLIC PANEL
Alkaline Phosphatase: 57 U/L (ref 39–117)
BUN: 33 mg/dL — ABNORMAL HIGH (ref 6–23)
CO2: 24 mEq/L (ref 19–32)
Chloride: 88 mEq/L — ABNORMAL LOW (ref 96–112)
GFR calc Af Amer: 7 mL/min — ABNORMAL LOW (ref >90)
Glucose, Bld: 212 mg/dL — ABNORMAL HIGH (ref 70–99)
Potassium: 4.6 mEq/L (ref 3.5–5.1)
Total Bilirubin: 0.3 mg/dL (ref 0.3–1.2)

## 2013-07-16 LAB — LIPASE, BLOOD: Lipase: 88 U/L — ABNORMAL HIGH (ref 11–59)

## 2013-07-16 LAB — CREATININE, SERUM: GFR calc Af Amer: 7 mL/min — ABNORMAL LOW (ref >90)

## 2013-07-16 MED ORDER — LORAZEPAM 2 MG/ML IJ SOLN
0.5000 mg | Freq: Once | INTRAMUSCULAR | Status: AC
  Administered 2013-07-16: 0.5 mg via INTRAMUSCULAR
  Filled 2013-07-16: qty 1

## 2013-07-16 MED ORDER — LIDOCAINE-PRILOCAINE 2.5-2.5 % EX CREA: 1.0000 "application " | TOPICAL_CREAM | CUTANEOUS | Status: DC | PRN

## 2013-07-16 MED ORDER — FENTANYL CITRATE 0.05 MG/ML IJ SOLN
50.0000 ug | Freq: Once | INTRAMUSCULAR | Status: AC
  Administered 2013-07-16: 50 ug via INTRAVENOUS
  Filled 2013-07-16: qty 2

## 2013-07-16 MED ORDER — BIOTENE DRY MOUTH MT LIQD: 15.0000 mL | OROMUCOSAL | Status: DC | PRN

## 2013-07-16 MED ORDER — ONDANSETRON HCL 4 MG/2ML IJ SOLN: 4.0000 mg | Freq: Four times a day (QID) | INTRAMUSCULAR | Status: DC | PRN

## 2013-07-16 MED ORDER — IOHEXOL 300 MG/ML  SOLN
50.0000 mL | Freq: Once | INTRAMUSCULAR | Status: AC | PRN
  Administered 2013-07-16: 50 mL via ORAL

## 2013-07-16 MED ORDER — POLYVINYL ALCOHOL 1.4 % OP SOLN
1.0000 [drp] | OPHTHALMIC | Status: DC | PRN
  Filled 2013-07-16: qty 15

## 2013-07-16 MED ORDER — LIDOCAINE HCL 2 % EX GEL
CUTANEOUS | Status: AC
  Filled 2013-07-16: qty 10

## 2013-07-16 MED ORDER — HEPARIN SODIUM (PORCINE) 5000 UNIT/ML IJ SOLN
5000.0000 [IU] | Freq: Three times a day (TID) | INTRAMUSCULAR | Status: DC
  Administered 2013-07-16 – 2013-07-18 (×6): 5000 [IU] via SUBCUTANEOUS
  Filled 2013-07-16 (×10): qty 1

## 2013-07-16 MED ORDER — CLONIDINE HCL 0.1 MG/24HR TD PTWK
0.1000 mg | MEDICATED_PATCH | TRANSDERMAL | Status: DC
  Administered 2013-07-16: 0.1 mg via TRANSDERMAL
  Filled 2013-07-16: qty 1

## 2013-07-16 MED ORDER — ONDANSETRON HCL 4 MG/2ML IJ SOLN
4.0000 mg | Freq: Once | INTRAMUSCULAR | Status: AC
  Administered 2013-07-16: 4 mg via INTRAVENOUS
  Filled 2013-07-16: qty 2

## 2013-07-16 MED ORDER — PANTOPRAZOLE SODIUM 40 MG PO TBEC
40.0000 mg | DELAYED_RELEASE_TABLET | Freq: Every day | ORAL | Status: DC
  Administered 2013-07-16 – 2013-07-18 (×3): 40 mg via ORAL
  Filled 2013-07-16 (×2): qty 1

## 2013-07-16 MED ORDER — CLONIDINE HCL 0.1 MG PO TABS
0.1000 mg | ORAL_TABLET | Freq: Two times a day (BID) | ORAL | Status: DC
  Administered 2013-07-16: 0.1 mg via ORAL
  Filled 2013-07-16 (×3): qty 1

## 2013-07-16 MED ORDER — BENZOCAINE 20 % MT SOLN
Freq: Four times a day (QID) | OROMUCOSAL | Status: DC | PRN
  Administered 2013-07-16: 1 via OROMUCOSAL
  Filled 2013-07-16: qty 57

## 2013-07-16 MED ORDER — SODIUM CHLORIDE 0.9 % IJ SOLN: 10.0000 mL | Freq: Two times a day (BID) | INTRAMUSCULAR | Status: DC

## 2013-07-16 MED ORDER — SODIUM CHLORIDE 0.9 % IJ SOLN
10.0000 mL | INTRAMUSCULAR | Status: DC | PRN
  Administered 2013-07-17: 10 mL

## 2013-07-16 MED ORDER — POLYVINYL ALCOHOL-POVIDONE 5-6 MG/ML OP SOLN: 1.0000 [drp] | OPHTHALMIC | Status: DC | PRN

## 2013-07-16 MED ORDER — ONDANSETRON HCL 4 MG PO TABS: 4.0000 mg | ORAL_TABLET | Freq: Four times a day (QID) | ORAL | Status: DC | PRN

## 2013-07-16 MED ORDER — BENZOCAINE (TOPICAL) 20 % EX AERO
INHALATION_SPRAY | Freq: Four times a day (QID) | CUTANEOUS | Status: DC | PRN
  Administered 2013-07-16: 13:00:00 via OROMUCOSAL
  Filled 2013-07-16: qty 57

## 2013-07-16 MED ORDER — HYDROMORPHONE HCL PF 1 MG/ML IJ SOLN
0.5000 mg | INTRAMUSCULAR | Status: DC | PRN
  Administered 2013-07-17: 0.5 mg via INTRAVENOUS
  Filled 2013-07-16: qty 1

## 2013-07-16 MED ORDER — DEXTROSE-NACL 5-0.9 % IV SOLN
INTRAVENOUS | Status: DC
  Administered 2013-07-16 – 2013-07-17 (×2): via INTRAVENOUS

## 2013-07-16 MED ORDER — INSULIN ASPART 100 UNIT/ML ~~LOC~~ SOLN
0.0000 [IU] | SUBCUTANEOUS | Status: DC
  Administered 2013-07-17: 17:00:00 2 [IU] via SUBCUTANEOUS

## 2013-07-16 MED ORDER — SODIUM CHLORIDE 0.9 % IV SOLN
1000.0000 mL | Freq: Once | INTRAVENOUS | Status: AC
  Administered 2013-07-16: 1000 mL via INTRAVENOUS

## 2013-07-16 MED ORDER — ALPRAZOLAM 0.5 MG PO TABS
0.5000 mg | ORAL_TABLET | Freq: Three times a day (TID) | ORAL | Status: DC | PRN
  Administered 2013-07-17: 21:00:00 1 mg via ORAL
  Filled 2013-07-16: qty 2

## 2013-07-16 NOTE — Progress Notes (Signed)
Utilization Review Completed.   Lakeithia Rasor, RN, BSN Nurse Case Manager  [phone]  

## 2013-07-16 NOTE — Progress Notes (Signed)
[facility] PROGRESS NOTE  Assessment/Plan:  Nausea and vomiting due to SBO (small bowel obstruction): - NPO, NG tube for continuos suction. - Consult surgery, avoid narcotics and monitor electrolytes. - IV fluids gentle as she is HD pt.  ESRD on hemodialysis - renal has been notified.   Protein-calorie malnutrition, severe - nutrition consult.  Type II or unspecified type diabetes mellitus with ketoacidosis, not stated as uncontrolled - SSI.  Anemia of renal disease - cont Aranesp and  IV iron per renal.   Large cell (diffuse) non-Hodgkin's lymphoma - follow up with onc as an outpatient.   Code Status: full Family Communication: none  Disposition Plan: inpatient   Consultants:  Renal  surgery  Procedures:  CT abd and pelvis  Antibiotics:  None  HPI/Subjective: Abdominal pain resolved. Passing flatus  Objective: Filed Vitals:   07/16/13 0059 07/16/13 0544 07/16/13 0848  BP: 207/83 200/80 151/68  Pulse: 88 93 84  Temp: 98.9 F (37.2 C) 98.4 F (36.9 C) 98.7 F (37.1 C)  TempSrc: Oral Oral Oral  Resp: 18 12 18   Height:   5\' 2"  (1.575 m)  Weight:   64.9 kg (143 lb 1.3 oz)  SpO2: 100% 97% 99%   No intake or output data in the 24 hours ending 07/16/13 1336 Filed Weights   07/16/13 0848  Weight: 64.9 kg (143 lb 1.3 oz)    Exam:  General: Alert, awake, oriented x3, in no acute distress.  HEENT: No bruits, no goiter.  Heart: Regular rate and rhythm, without murmurs, rubs, gallops.  Lungs: Good air movement, bilateral air movement.  Abdomen: Soft, diffuse mild tender, nondistended, positive bowel sounds.     Data Reviewed: Basic Metabolic Panel:  Recent Labs Lab 07/14/13 1112 07/16/13 0130 07/16/13 0227 07/16/13 1030  NA 137 135 134*  --   K 4.3 4.6 4.5  --   CL 92* 88* 99  --   CO2 33 24  --   --   GLUCOSE 139* 212* 207*  --   BUN 11 33* 31*  --   CREATININE 3.4* 6.05* 6.60* 6.28*  CALCIUM 9.5 9.8  --   --    Liver  Function Tests:  Recent Labs Lab 07/14/13 1112 07/16/13 0130  AST 21 25  ALT 19 12  ALKPHOS 48 57  BILITOT 0.60 0.3  PROT 6.6 6.9  ALBUMIN  --  3.7    Recent Labs Lab 07/16/13 0130  LIPASE 88*   No results found for this basename: AMMONIA,  in the last 168 hours CBC:  Recent Labs Lab 07/14/13 1112 07/16/13 0130 07/16/13 0227 07/16/13 1030  WBC 5.1 9.1  --  7.3  NEUTROABS 1.6 4.7  --   --   HGB 11.4* 11.5* 12.6 10.1*  HCT 34.8 33.7* 37.0 30.3*  MCV 106* 100.0  --  101.3*  PLT 88* 134*  --  133*   Cardiac Enzymes: No results found for this basename: CKTOTAL, CKMB, CKMBINDEX, TROPONINI,  in the last 168 hours BNP (last 3 results) No results found for this basename: PROBNP,  in the last 8760 hours CBG:  Recent Labs Lab 07/16/13 0801  GLUCAP 128*    No results found for this or any previous visit (from the past 240 hour(s)).   Studies: Ct Abdomen Pelvis Wo Contrast  07/16/2013   CLINICAL DATA:  Elevated lipase. Diffuse abdominal pain.  EXAM: CT ABDOMEN AND PELVIS WITHOUT CONTRAST  TECHNIQUE: Multidetector CT imaging of the abdomen and pelvis was performed  following the standard protocol without intravenous contrast.  COMPARISON:  The 05/28/2013 PET-CT.  FINDINGS: BODY WALL: Unremarkable.  LOWER CHEST:  Mediastinum: Extensive coronary artery atherosclerosis.  Lungs/pleura: No consolidation.  ABDOMEN/PELVIS:  Liver: Numerous calcifications, probable remote granulomatous  Biliary: Layering gallstones. No acute cholecystitis.  Pancreas: Unremarkable.  Spleen: Thick walled subcapsular collection in the lateral spleen, 3.5 cm, compatible with pseudocyst from remote insult.  Adrenals: Thickening and coarse calcifications in the left adrenal gland, stable.  Kidneys and ureters: No hydronephrosis or stone.  Bladder: Unremarkable.  Bowel: Dilated small bowel with fluid levels, leading to the pelvis were there is abrupt caliber change at the level of the vaginal cuff. No mass seen  in this region on recent PET-CT 05/28/2013. The dilated distal small bowel has mild mesenteric edematous changes. No pneumatosis or perforation. Status post appendectomy. 20 x 8 mm lipoma in the cecum.  Retroperitoneum: No mass or adenopathy.  Peritoneum: No pneumoperitoneum.  Reproductive: Hysterectomy.  Vascular: No acute abnormality.  OSSEOUS: Marked diffuse demineralization. No acute abnormality.  IMPRESSION: 1. High-grade mid small bowel obstruction with transition point in the low pelvis, near the vaginal cuff. 2. Cholelithiasis.   Electronically Signed   By: Tiburcio Pea   On: 07/16/2013 04:39    Scheduled Meds: . cloNIDine  0.1 mg Oral BID  . heparin  5,000 Units Subcutaneous Q8H  . lidocaine      . pantoprazole  40 mg Oral Daily  . sodium chloride  10-40 mL Intracatheter Q12H   Continuous Infusions: . dextrose 5 % and 0.9% NaCl 50 mL/hr at 07/16/13 1017     FELIZ Rosine Beat  [facility] Pager [phone]. If 8PM-8AM, please contact night-coverage at [email or web address], password [facility] 07/16/2013, 1:36 PM  LOS: 0 days

## 2013-07-16 NOTE — Progress Notes (Signed)
INITIAL NUTRITION ASSESSMENT  DOCUMENTATION CODES Per approved criteria  -Severe malnutrition in the context of chronic illness   INTERVENTION: Diet advancement per MD discretion Provide Resource Breeze once daily or Nepro Carb Steady once daily when diet advanced Recommend Nephro-vite daily   NUTRITION DIAGNOSIS: Inadequate oral intake related to poor appetite/nausea as evidenced by 11% wt loss in less than 2 months.   Goal: Pt to meet >/= 90% of their estimated nutrition needs   Monitor:  Diet advancement/PO intake Weight Labs  Reason for Assessment: Malnutrition Screening Tool, score of 2  72 y.o. female  Admitting Dx: SBO (small bowel obstruction)  ASSESSMENT: 72 y.o. female with hx of lymphoma (Dr Myna Hidalgo), ESRD on HD (Tu, [place], Sat), HTN, DM2, s/p radiation thx to right face, presents to the ER with abdominal pain for one day. She also has some nausea, but denied any fever or chills. She has passed flatus and had a small amount of bowel movement today. Evaluation in the ER included an abdominal pelvic CT without constrast showing small bowel obtruction with transition point.  Pt reports that she has had a very poor appetite for about one year- her usual body weight one year ago was 165 lbs and she has been steadily losing weight. Per weight history pt has had 11% wt loss in the past 2 months and 21% wt loss in less than 6 months. Pt reports nausea today but, expresses desire to eat. Pt states that PTA she was eating 2 small meals daily, no use of nutritional supplements. Per chart pt was recently started on Megace to help improve appetite.   Pt meets criteria for Severe MALNUTRITION in the context of Chronic illness as evidenced by 21% wt loss in less than 6 months and energy intake <75% of estimated energy needs for several months.   Height: Ht Readings from Last 1 Encounters:  07/16/13 5\' 2"  (1.575 m)    Weight: Wt Readings from Last 1 Encounters:  07/16/13 143 lb 1.3  oz (64.9 kg)    Ideal Body Weight: 110 lbs  % Ideal Body Weight: 130%  Wt Readings from Last 10 Encounters:  07/16/13 143 lb 1.3 oz (64.9 kg)  07/14/13 131 lb (59.421 kg)  06/23/13 136 lb 4.8 oz (61.825 kg)  06/16/13 139 lb 4.8 oz (63.186 kg)  06/09/13 146 lb 8 oz (66.452 kg)  05/27/13 148 lb (67.132 kg)  05/22/13 147 lb (66.679 kg)  05/05/13 149 lb (67.586 kg)  04/22/13 151 lb (68.493 kg)  04/14/13 151 lb 6.4 oz (68.675 kg)  Suspect weight of 143 lbs on 9/18 inaccurate; pt reports her weight has dropped to 131 lbs.  Usual Body Weight: 165 lbs (April 2014)  % Usual Body Weight: 79%  BMI:  Body mass index is 26.16 kg/(m^2).  Estimated Nutritional Needs: Kcal: 1500-1700 Protein: 70-80 grams Fluid: urine output plus 1000 ml  Skin: intact  Diet Order: NPO  EDUCATION NEEDS: -Education needs addressed  No intake or output data in the 24 hours ending 07/16/13 1230  Last BM: 9/17   Labs:   Recent Labs Lab 07/14/13 1112 07/16/13 0130 07/16/13 0227 07/16/13 1030  NA 137 135 134*  --   K 4.3 4.6 4.5  --   CL 92* 88* 99  --   CO2 33 24  --   --   BUN 11 33* 31*  --   CREATININE 3.4* 6.05* 6.60* 6.28*  CALCIUM 9.5 9.8  --   --   GLUCOSE  139* 212* 207*  --     CBG (last 3)   Recent Labs  07/16/13 0801  GLUCAP 128*    Scheduled Meds: . cloNIDine  0.1 mg Oral BID  . heparin  5,000 Units Subcutaneous Q8H  . lidocaine      . pantoprazole  40 mg Oral Daily  . sodium chloride  10-40 mL Intracatheter Q12H    Continuous Infusions: . dextrose 5 % and 0.9% NaCl 50 mL/hr at 07/16/13 1017    Past Medical History  Diagnosis Date  . Hypertension   . Anginal pain   . Headache(784.0)   . Anxiety     h/o anxiety attacks  . Myocardial infarction 2011  . Arthritis     Left hip  . Anemia of renal disease 09/17/2012  . Bleeding   . Blood transfusion without reported diagnosis     hx of  . Acute renal failure   . Claustrophobia   . Panic attacks   .  Cancer 11/2009, 04/2013    lymphoma, NHL B cell  . 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 Hx of radiation therapy 04/2013    right supraorbital region    Past Surgical History  Procedure Laterality Date  . Abdominal hysterectomy      30 yrs. ago  . Appendectomy  2009  . Tonsillectomy      age 85  . Cardiac catheterization  2011  . Rotator cuff repair      Right arm  . Esophagogastroduodenoscopy (egd) with propofol  10/06/2012    Procedure: ESOPHAGOGASTRODUODENOSCOPY (EGD) WITH PROPOFOL;  Surgeon: Willis Modena, MD;  Location: [facility];  Service: Endoscopy;  Laterality: N/A;  . Colonoscopy with propofol  10/06/2012    Procedure: COLONOSCOPY WITH PROPOFOL;  Surgeon: Willis Modena, MD;  Location: [facility];  Service: Endoscopy;  Laterality: N/A;  . Colonoscopy with propofol N/A 01/14/2013    Procedure: COLONOSCOPY WITH PROPOFOL;  Surgeon: Willis Modena, MD;  Location: [facility];  Service: Endoscopy;  Laterality: N/A;  . Portacath placement Right   . Portacath placement Right 01/22/2013    Ian Malkin RD, LDN Inpatient Clinical Dietitian Pager: [phone] After Hours Pager: [phone]

## 2013-07-16 NOTE — H&P (Signed)
[facility] History and Physical  ROSEMARY MOSSBARGER WUJ:[id number] DOB: May 18, 1941    PCP:   [name], MD   Chief Complaint: abdominal pain, nausea and vomiting.  HPI: [name] is an 72 y.o. female with hx of lymphoma (Dr Myna Hidalgo), ESRD on HD (Tu, [place], Sat), HTN, DM2, s/p radiation thx to right face, presents to the ER with abdominal pain for one day.  She also has some nausea, but denied any fever or chills.  She has passed flatus and had a small amount of bowel movement today.  Evaluation in the ER included an abdominal pelvic CT without constrast showing small bowel obtruction with transition point.  Her lipase was increased to 88. She has normal WBC and is non acidotic.  Hospitalist was asked to admit her for SBO. Because of her dialysis requirement, she will need to be transferred to [facility].  Dr Magnus Ivan was consulted by the EDP, but he was in a trauma and requested surgical consult when she arrives at [facility]. Of note, she was scheduled to have a AV fistula placement later this week.    Rewiew of Systems:  Constitutional: Negative for malaise, fever and chills. No significant weight loss or weight gain Eyes: Negative for eye pain, redness and discharge, diplopia, visual changes, or flashes of light. ENMT: Negative for ear pain, hoarseness, nasal congestion, sinus pressure and sore throat. No headaches; tinnitus, drooling, or problem swallowing. Cardiovascular: Negative for chest pain, palpitations, diaphoresis, dyspnea and peripheral edema. ; No orthopnea, PND Respiratory: Negative for cough, hemoptysis, wheezing and stridor. No pleuritic chestpain. Gastrointestinal: Negative for diarrhea, constipation, melena, blood in stool, hematemesis, jaundice and rectal bleeding.    Genitourinary: Negative for frequency, dysuria, incontinence,flank pain and hematuria; Musculoskeletal: Negative for back pain and neck pain. Negative for swelling and trauma.;  Skin: .  Negative for pruritus, rash, abrasions, bruising and skin lesion.; ulcerations Neuro: Negative for headache, lightheadedness and neck stiffness. Negative for weakness, altered level of consciousness , altered mental status, extremity weakness, burning feet, involuntary movement, seizure and syncope.  Psych: negative for anxiety, depression, insomnia, tearfulness, panic attacks, hallucinations, paranoia, suicidal or homicidal ideation    Past Medical History  Diagnosis Date  . Hypertension   . Anginal pain   . Headache(784.0)   . Anxiety     h/o anxiety attacks  . Myocardial infarction 2011  . Arthritis     Left hip  . Anemia of renal disease 09/17/2012  . Bleeding   . Blood transfusion without reported diagnosis     hx of  . Acute renal failure   . Claustrophobia   . Panic attacks   . Cancer 11/2009, 04/2013    lymphoma, NHL B cell  . 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 Hx of radiation therapy 04/2013    right supraorbital region    Past Surgical History  Procedure Laterality Date  . Abdominal hysterectomy      30 yrs. ago  . Appendectomy  2009  . Tonsillectomy      age 44  . Cardiac catheterization  2011  . Rotator cuff repair      Right arm  . Esophagogastroduodenoscopy (egd) with propofol  10/06/2012    Procedure: ESOPHAGOGASTRODUODENOSCOPY (EGD) WITH PROPOFOL;  Surgeon: Willis Modena, MD;  Location: [facility];  Service: Endoscopy;  Laterality: N/A;  . Colonoscopy with propofol  10/06/2012    Procedure: COLONOSCOPY WITH PROPOFOL;  Surgeon: Willis Modena, MD;  Location: [facility];  Service: Endoscopy;  Laterality: N/A;  . Colonoscopy with propofol N/A 01/14/2013    Procedure: COLONOSCOPY WITH PROPOFOL;  Surgeon: Willis Modena, MD;  Location: [facility];  Service: Endoscopy;  Laterality: N/A;  . Portacath placement Right   . Portacath placement Right 01/22/2013    Medications:  HOME MEDS: Prior to Admission medications    Medication Sig Start Date End Date Taking? Authorizing Provider  ALPRAZolam Prudy Feeler) 0.5 MG tablet Take 0.5-1 mg by mouth 3 (three) times daily as needed for sleep or anxiety.   Yes Historical Provider, MD  cloNIDine (CATAPRES) 0.1 MG tablet Take 0.1 mg by mouth 2 (two) times daily.    Yes Historical Provider, MD  docusate sodium 100 MG CAPS Take 200 mg by mouth 3 (three) times daily as needed for constipation. 01/27/13  Yes Josph Macho, MD  esomeprazole (NEXIUM) 40 MG capsule Take 40 mg by mouth daily.     Yes Historical Provider, MD  lidocaine-prilocaine (EMLA) cream Apply 1 application topically as needed (for port-a-cath access).   Yes Historical Provider, MD  megestrol (MEGACE ES) 625 MG/5ML suspension Take 5 mLs (625 mg total) by mouth daily. 07/14/13  Yes Josph Macho, MD  ondansetron (ZOFRAN-ODT) 8 MG disintegrating tablet Take 8 mg by mouth every 8 (eight) hours as needed for nausea.  01/30/13  Yes Historical Provider, MD  OVER THE COUNTER MEDICATION Take 5 mLs by mouth 2 (two) times daily at 10 AM and 5 PM. creamotion cough syrup   Yes Historical Provider, MD  polyethylene glycol powder (MIRALAX) powder Take 17 g by mouth daily.    Yes Historical Provider, MD  Polyvinyl Alcohol-Povidone (CLEAR EYES ALL SEASONS OP) Apply 1 drop to eye daily as needed. For itchy & dry eyes   Yes Historical Provider, MD  traMADol (ULTRAM) 50 MG tablet Take 50-100 mg by mouth every 6 (six) hours as needed for pain.    Yes Historical Provider, MD  prochlorperazine (COMPAZINE) 10 MG tablet Take 10 mg by mouth every 8 (eight) hours as needed (for nausea).  01/27/13   Historical Provider, MD     Allergies:  Allergies  Allergen Reactions  . Advicor [Niacin-Lovastatin Er]     Muscle cramps  . Crestor [Rosuvastatin]     Muscle cramps  . Lipitor [Atorvastatin]     Muscle cramps  . Lovastatin     Muscle cramps  . Morphine And Related Other (See Comments)    "made me crazy"  . Niaspan [Niacin Er]      Muscle cramps  . Nsaids     Gi side effects  . Pravachol [Pravastatin Sodium]     Gi side effects  . Sulfa Antibiotics Other (See Comments)    Can't remember   . Vytorin [Ezetimibe-Simvastatin] Nausea Only  . Welchol [Colesevelam Hcl]     Muscle cramps  . Zetia [Ezetimibe]     Muscle cramps   . Zocor [Simvastatin]     Muscle cramps     Social History:   reports that she quit smoking about 49 years ago. She has never used smokeless tobacco. She reports that she does not drink alcohol or use illicit drugs.  Family History: Family History  Problem Relation Age of Onset  . Cancer Mother     throat  . Alzheimer's disease Sister   . Cancer Brother     lymphoma  . Cancer Sister     unknown type     Physical Exam: Filed Vitals:  07/16/13 0059 07/16/13 0544  BP: 207/83 200/80  Pulse: 88 93  Temp: 98.9 F (37.2 C) 98.4 F (36.9 C)  TempSrc: Oral Oral  Resp: 18 12  SpO2: 100% 97%   Blood pressure 200/80, pulse 93, temperature 98.4 F (36.9 C), temperature source Oral, resp. rate 12, SpO2 97.00%.  GEN:  Pleasant  patient lying in the stretcher in no acute distress; cooperative with exam. PSYCH:  alert and oriented x4; does not appear anxious or depressed; affect is appropriate. HEENT: Mucous membranes pink and anicteric; PERRLA; EOM intact; no cervical lymphadenopathy nor thyromegaly or carotid bruit; no JVD; There were no stridor. Neck is very supple. Breasts:: Not examined CHEST WALL: No tenderness.  She has a portacath and a dialysis catheter on her chest wall. CHEST: Normal respiration, clear to auscultation bilaterally.  HEART: Regular rate and rhythm.  There are no murmur, rub, or gallops.   BACK: No kyphosis or scoliosis; no CVA tenderness ABDOMEN: soft and only slightly tender. no masses, no organomegaly, normal abdominal bowel sounds; no pannus; no intertriginous candida. There is no rebound and no distention. Rectal Exam: Not done EXTREMITIES: No bone or  joint deformity; age-appropriate arthropathy of the hands and knees; no edema; no ulcerations.  There is no calf tenderness. Genitalia: not examined PULSES: 2+ and symmetric SKIN: Normal hydration no rash or ulceration CNS: Cranial nerves 2-12 grossly intact no focal lateralizing neurologic deficit.  Speech is fluent; uvula elevated with phonation, facial symmetry and tongue midline. DTR are normal bilaterally, cerebella exam is intact, barbinski is negative and strengths are equaled bilaterally.  No sensory loss.   Labs on Admission:  Basic Metabolic Panel:  Recent Labs Lab 07/14/13 1112 07/16/13 0130 07/16/13 0227  NA 137 135 134*  K 4.3 4.6 4.5  CL 92* 88* 99  CO2 33 24  --   GLUCOSE 139* 212* 207*  BUN 11 33* 31*  CREATININE 3.4* 6.05* 6.60*  CALCIUM 9.5 9.8  --    Liver Function Tests:  Recent Labs Lab 07/14/13 1112 07/16/13 0130  AST 21 25  ALT 19 12  ALKPHOS 48 57  BILITOT 0.60 0.3  PROT 6.6 6.9  ALBUMIN  --  3.7    Recent Labs Lab 07/16/13 0130  LIPASE 88*   No results found for this basename: AMMONIA,  in the last 168 hours CBC:  Recent Labs Lab 07/14/13 1112 07/16/13 0130 07/16/13 0227  WBC 5.1 9.1  --   NEUTROABS 1.6 4.7  --   HGB 11.4* 11.5* 12.6  HCT 34.8 33.7* 37.0  MCV 106* 100.0  --   PLT 88* 134*  --    Cardiac Enzymes: No results found for this basename: CKTOTAL, CKMB, CKMBINDEX, TROPONINI,  in the last 168 hours  CBG: No results found for this basename: GLUCAP,  in the last 168 hours   Radiological Exams on Admission: Ct Abdomen Pelvis Wo Contrast  07/16/2013   CLINICAL DATA:  Elevated lipase. Diffuse abdominal pain.  EXAM: CT ABDOMEN AND PELVIS WITHOUT CONTRAST  TECHNIQUE: Multidetector CT imaging of the abdomen and pelvis was performed following the standard protocol without intravenous contrast.  COMPARISON:  The 05/28/2013 PET-CT.  FINDINGS: BODY WALL: Unremarkable.  LOWER CHEST:  Mediastinum: Extensive coronary artery  atherosclerosis.  Lungs/pleura: No consolidation.  ABDOMEN/PELVIS:  Liver: Numerous calcifications, probable remote granulomatous  Biliary: Layering gallstones. No acute cholecystitis.  Pancreas: Unremarkable.  Spleen: Thick walled subcapsular collection in the lateral spleen, 3.5 cm, compatible with pseudocyst from remote insult.  Adrenals: Thickening and coarse calcifications in the left adrenal gland, stable.  Kidneys and ureters: No hydronephrosis or stone.  Bladder: Unremarkable.  Bowel: Dilated small bowel with fluid levels, leading to the pelvis were there is abrupt caliber change at the level of the vaginal cuff. No mass seen in this region on recent PET-CT 05/28/2013. The dilated distal small bowel has mild mesenteric edematous changes. No pneumatosis or perforation. Status post appendectomy. 20 x 8 mm lipoma in the cecum.  Retroperitoneum: No mass or adenopathy.  Peritoneum: No pneumoperitoneum.  Reproductive: Hysterectomy.  Vascular: No acute abnormality.  OSSEOUS: Marked diffuse demineralization. No acute abnormality.  IMPRESSION: 1. High-grade mid small bowel obstruction with transition point in the low pelvis, near the vaginal cuff. 2. Cholelithiasis.   Electronically Signed   By: Tiburcio Pea   On: 07/16/2013 04:39    Assessment/Plan Present on Admission:  . Large cell (diffuse) non-Hodgkin's lymphoma . Nausea and vomiting . Type II or unspecified type diabetes mellitus with ketoacidosis, not stated as uncontrolled . Anemia of renal disease SBO ESRD on HD.  PLAN:  She will be admitted for SBO, and will be transferred to [facility] for dialysis today as she has ESRD on HD.  I have left a message for dialysis on the quick consult line.  Surgery was not able to consult, so please consult surgery once she arrives at [facility].  In the interim, will give NG tube, IVF, and made NPO.  Hopefully, her SBO will resolve without surgery.  For her DM, will use SSI sensitive, and give D5 as she is  NPO.  She is stable, full code, and will be admitted to [facility] service.  Thank you for allowing me to participate in the care of your nice patient.  Other plans as per orders.  Code Status: FULL Unk Lightning, MD. [facility] Pager [phone] 7pm to 7am.  07/16/2013, 6:31 AM

## 2013-07-16 NOTE — Consult Note (Signed)
I have seen and examined this patient and agree with the plan of care. Patient has partial small bowel obstruction.  Evaluation for management of ESRD. Vitals are stable with no distress and well controlled volume status. Evaluation of out patient records indicates that she receives dialysis TTS at [facility]. She is dialyzed for 4 hrs on a 180 non-reuse dialyzer with a 2.25 calcium and 2 potassium dialysate and linear sodium 138.She receives erythropoietin and iron to keep his Hb above 10 and IV Vitamin D and phosphate binders to keep his Bone mineral goals at [facility]. She has a catheter with no problems during usage. We shall proceed with in hospital scheduled dialysis  07/16/2013, 8:26 PM

## 2013-07-16 NOTE — ED Notes (Signed)
Per EMS: Pr c/o of generalized ab pain starting 3 hours ago. Pt reports n/vx1. Hx of lymphoma and dialysis pt. Due for dialysis in the morning.

## 2013-07-16 NOTE — Consult Note (Signed)
[name] May 24, 1941  [id number].   Primary oncologist: Dr. Christin Bach Requesting MD: Dr. Marinda Elk Chief Complaint/Reason for Consult: PSBO HPI: This is 72 yo white female with non-Hodgkins B cell lymphoma that actually just recurred above her right eye 3 days ago.  She saw Dr. Myna Hidalgo who plans for a PET scan in the near future to re-evaluate her cancer status.    Yesterday, around 1300, she developed upper and lower abdominal pain with nausea and vomiting.  She has continued to pass flatus and had a small BM yesterday.  She presented to the [facility] for further evaluation and had a CT scan that revealed a high-grade PSBO with transition point in the deep pelvis.  She was admitted by the medicine service and we have been asked to see her.  Review of Systems: Please see HPI, otherwise all other systems have been reviewed and are negative.  Family History  Problem Relation Age of Onset  . Cancer Mother     throat  . Alzheimer's disease Sister   . Cancer Brother     lymphoma  . Cancer Sister     unknown type    Past Medical History  Diagnosis Date  . Hypertension   . Anginal pain   . Headache(784.0)   . Anxiety     h/o anxiety attacks  . Myocardial infarction 2011  . Arthritis     Left hip  . Anemia of renal disease 09/17/2012  . Bleeding   . Blood transfusion without reported diagnosis     hx of  . Acute renal failure   . Claustrophobia   . Panic attacks   . Cancer 11/2009, 04/2013    lymphoma, NHL B cell  . 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 Hx of radiation therapy 04/2013    right supraorbital region    Past Surgical History  Procedure Laterality Date  . Abdominal hysterectomy      30 yrs. ago  . Appendectomy  2009  . Tonsillectomy      age 11  . Cardiac catheterization  2011  . Rotator cuff repair      Right arm  . Esophagogastroduodenoscopy (egd) with propofol  10/06/2012    Procedure: ESOPHAGOGASTRODUODENOSCOPY  (EGD) WITH PROPOFOL;  Surgeon: Willis Modena, MD;  Location: [facility];  Service: Endoscopy;  Laterality: N/A;  . Colonoscopy with propofol  10/06/2012    Procedure: COLONOSCOPY WITH PROPOFOL;  Surgeon: Willis Modena, MD;  Location: [facility];  Service: Endoscopy;  Laterality: N/A;  . Colonoscopy with propofol N/A 01/14/2013    Procedure: COLONOSCOPY WITH PROPOFOL;  Surgeon: Willis Modena, MD;  Location: [facility];  Service: Endoscopy;  Laterality: N/A;  . Portacath placement Right   . Portacath placement Right 01/22/2013    Social History:  reports that she quit smoking about 49 years ago. She has never used smokeless tobacco. She reports that she does not drink alcohol or use illicit drugs.  Allergies:  Allergies  Allergen Reactions  . Advicor [Niacin-Lovastatin Er]     Muscle cramps  . Crestor [Rosuvastatin]     Muscle cramps  . Lipitor [Atorvastatin]     Muscle cramps  . Lovastatin     Muscle cramps  . Morphine And Related Other (See Comments)    "made me crazy"  . Niaspan [Niacin Er]     Muscle cramps  . Nsaids     Gi side effects  . Pravachol [Pravastatin Sodium]  Gi side effects  . Sulfa Antibiotics Other (See Comments)    Can't remember   . Vytorin [Ezetimibe-Simvastatin] Nausea Only  . Welchol [Colesevelam Hcl]     Muscle cramps  . Zetia [Ezetimibe]     Muscle cramps   . Zocor [Simvastatin]     Muscle cramps     Medications Prior to Admission  Medication Sig Dispense Refill  . ALPRAZolam (XANAX) 0.5 MG tablet Take 0.5-1 mg by mouth 3 (three) times daily as needed for sleep or anxiety.      . cloNIDine (CATAPRES) 0.1 MG tablet Take 0.1 mg by mouth 2 (two) times daily.       Marland Kitchen docusate sodium 100 MG CAPS Take 200 mg by mouth 3 (three) times daily as needed for constipation.  60 capsule  2  . esomeprazole (NEXIUM) 40 MG capsule Take 40 mg by mouth daily.        Marland Kitchen lidocaine-prilocaine (EMLA) cream Apply 1 application topically as needed (for  port-a-cath access).      . megestrol (MEGACE ES) 625 MG/5ML suspension Take 5 mLs (625 mg total) by mouth daily.  150 mL  4  . ondansetron (ZOFRAN-ODT) 8 MG disintegrating tablet Take 8 mg by mouth every 8 (eight) hours as needed for nausea.       Marland Kitchen OVER THE COUNTER MEDICATION Take 5 mLs by mouth 2 (two) times daily at 10 AM and 5 PM. creamotion cough syrup      . polyethylene glycol powder (MIRALAX) powder Take 17 g by mouth daily.       . Polyvinyl Alcohol-Povidone (CLEAR EYES ALL SEASONS OP) Apply 1 drop to eye daily as needed. For itchy & dry eyes      . traMADol (ULTRAM) 50 MG tablet Take 50-100 mg by mouth every 6 (six) hours as needed for pain.       Marland Kitchen prochlorperazine (COMPAZINE) 10 MG tablet Take 10 mg by mouth every 8 (eight) hours as needed (for nausea).         Blood pressure 151/68, pulse 84, temperature 98.7 F (37.1 C), temperature source Oral, resp. rate 18, height 5\' 2"  (1.575 m), weight 143 lb 1.3 oz (64.9 kg), SpO2 99.00%. Physical Exam: General: pleasant, WD, WN white female who is laying in bed in NAD HEENT: head is normocephalic, atraumatic.  Sclera are noninjected.  Ptosis of her right eye with a disconjugate gaze.  There is fullness above her right eye c/w with lymphoma recurrence. Left eye is normal.  Ears and nose without any masses or lesions.  Mouth is pink and moist Heart: regular, rate, and rhythm.  Normal s1,s2. No obvious murmurs, gallops, or rubs noted.  Palpable radial and pedal pulses bilaterally.  She has a perm cath in her chest for HD Lungs: CTAB, no wheezes, rhonchi, or rales noted.  Respiratory effort nonlabored Abd: soft, minimal tenderness, ND, +BS, no masses, hernias, or organomegaly, pfannenstiel scar noted  MS: all 4 extremities are symmetrical with no cyanosis, clubbing, or edema. Skin: warm and dry with no masses, lesions, or rashes Psych: A&Ox3 with an appropriate affect.    Results for orders placed during the hospital encounter of 07/16/13  (from the past 48 hour(s))  CBC WITH DIFFERENTIAL     Status: Abnormal   Collection Time    07/16/13  1:30 AM      Result Value Range   WBC 9.1  4.0 - 10.5 K/uL   RBC 3.37 (*) 3.87 - 5.11 MIL/uL  Hemoglobin 11.5 (*) 12.0 - 15.0 g/dL   HCT 40.9 (*) 81.1 - 91.4 %   MCV 100.0  78.0 - 100.0 fL   MCH 34.1 (*) 26.0 - 34.0 pg   MCHC 34.1  30.0 - 36.0 g/dL   RDW 78.2 (*) 95.6 - 21.3 %   Platelets 134 (*) 150 - 400 K/uL   Neutrophils Relative % 52  43 - 77 %   Neutro Abs 4.7  1.7 - 7.7 K/uL   Lymphocytes Relative 38  12 - 46 %   Lymphs Abs 3.4  0.7 - 4.0 K/uL   Monocytes Relative 9  3 - 12 %   Monocytes Absolute 0.8  0.1 - 1.0 K/uL   Eosinophils Relative 2  0 - 5 %   Eosinophils Absolute 0.1  0.0 - 0.7 K/uL   Basophils Relative 0  0 - 1 %   Basophils Absolute 0.0  0.0 - 0.1 K/uL  COMPREHENSIVE METABOLIC PANEL     Status: Abnormal   Collection Time    07/16/13  1:30 AM      Result Value Range   Sodium 135  135 - 145 mEq/L   Potassium 4.6  3.5 - 5.1 mEq/L   Chloride 88 (*) 96 - 112 mEq/L   CO2 24  19 - 32 mEq/L   Glucose, Bld 212 (*) 70 - 99 mg/dL   BUN 33 (*) 6 - 23 mg/dL   Creatinine, Ser 0.86 (*) 0.50 - 1.10 mg/dL   Calcium 9.8  8.4 - 57.8 mg/dL   Total Protein 6.9  6.0 - 8.3 g/dL   Albumin 3.7  3.5 - 5.2 g/dL   AST 25  0 - 37 U/L   ALT 12  0 - 35 U/L   Alkaline Phosphatase 57  39 - 117 U/L   Total Bilirubin 0.3  0.3 - 1.2 mg/dL   GFR calc non Af Amer 6 (*) >90 mL/min   GFR calc Af Amer 7 (*) >90 mL/min   Comment: (NOTE)     The eGFR has been calculated using the CKD EPI equation.     This calculation has not been validated in all clinical situations.     eGFR's persistently <90 mL/min signify possible Chronic Kidney     Disease.  LIPASE, BLOOD     Status: Abnormal   Collection Time    07/16/13  1:30 AM      Result Value Range   Lipase 88 (*) 11 - 59 U/L  POCT I-STAT, CHEM 8     Status: Abnormal   Collection Time    07/16/13  2:27 AM      Result Value Range   Sodium  134 (*) 135 - 145 mEq/L   Potassium 4.5  3.5 - 5.1 mEq/L   Chloride 99  96 - 112 mEq/L   BUN 31 (*) 6 - 23 mg/dL   Creatinine, Ser 4.69 (*) 0.50 - 1.10 mg/dL   Glucose, Bld 629 (*) 70 - 99 mg/dL   Calcium, Ion 5.28 (*) 1.13 - 1.30 mmol/L   TCO2 26  0 - 100 mmol/L   Hemoglobin 12.6  12.0 - 15.0 g/dL   HCT 41.3  24.4 - 01.0 %  GLUCOSE, CAPILLARY     Status: Abnormal   Collection Time    07/16/13  8:01 AM      Result Value Range   Glucose-Capillary 128 (*) 70 - 99 mg/dL   Comment 1 Notify RN    CBC  Status: Abnormal   Collection Time    07/16/13 10:30 AM      Result Value Range   WBC 7.3  4.0 - 10.5 K/uL   RBC 2.99 (*) 3.87 - 5.11 MIL/uL   Hemoglobin 10.1 (*) 12.0 - 15.0 g/dL   HCT 96.0 (*) 45.4 - 09.8 %   MCV 101.3 (*) 78.0 - 100.0 fL   MCH 33.8  26.0 - 34.0 pg   MCHC 33.3  30.0 - 36.0 g/dL   RDW 11.9 (*) 14.7 - 82.9 %   Platelets 133 (*) 150 - 400 K/uL  CREATININE, SERUM     Status: Abnormal   Collection Time    07/16/13 10:30 AM      Result Value Range   Creatinine, Ser 6.28 (*) 0.50 - 1.10 mg/dL   GFR calc non Af Amer 6 (*) >90 mL/min   GFR calc Af Amer 7 (*) >90 mL/min   Comment: (NOTE)     The eGFR has been calculated using the CKD EPI equation.     This calculation has not been validated in all clinical situations.     eGFR's persistently <90 mL/min signify possible Chronic Kidney     Disease.   Ct Abdomen Pelvis Wo Contrast  07/16/2013   CLINICAL DATA:  Elevated lipase. Diffuse abdominal pain.  EXAM: CT ABDOMEN AND PELVIS WITHOUT CONTRAST  TECHNIQUE: Multidetector CT imaging of the abdomen and pelvis was performed following the standard protocol without intravenous contrast.  COMPARISON:  The 05/28/2013 PET-CT.  FINDINGS: BODY WALL: Unremarkable.  LOWER CHEST:  Mediastinum: Extensive coronary artery atherosclerosis.  Lungs/pleura: No consolidation.  ABDOMEN/PELVIS:  Liver: Numerous calcifications, probable remote granulomatous  Biliary: Layering gallstones. No acute  cholecystitis.  Pancreas: Unremarkable.  Spleen: Thick walled subcapsular collection in the lateral spleen, 3.5 cm, compatible with pseudocyst from remote insult.  Adrenals: Thickening and coarse calcifications in the left adrenal gland, stable.  Kidneys and ureters: No hydronephrosis or stone.  Bladder: Unremarkable.  Bowel: Dilated small bowel with fluid levels, leading to the pelvis were there is abrupt caliber change at the level of the vaginal cuff. No mass seen in this region on recent PET-CT 05/28/2013. The dilated distal small bowel has mild mesenteric edematous changes. No pneumatosis or perforation. Status post appendectomy. 20 x 8 mm lipoma in the cecum.  Retroperitoneum: No mass or adenopathy.  Peritoneum: No pneumoperitoneum.  Reproductive: Hysterectomy.  Vascular: No acute abnormality.  OSSEOUS: Marked diffuse demineralization. No acute abnormality.  IMPRESSION: 1. High-grade mid small bowel obstruction with transition point in the low pelvis, near the vaginal cuff. 2. Cholelithiasis.   Electronically Signed   By: Tiburcio Pea   On: 07/16/2013 04:39       Assessment/Plan 1. High grade PSBO 2. HTN 3. ESRD, HD 4. NHL, B cell 5. H/o MI 6. DM  Plan: 1. She is still passing flatus.  This appears to be a partial obstruction.  Agree with NGT placement and conservative management.  Hopefully, the patient will resolved without any further intervention; however, if she does not, she may require an operation.  I have discussed this with her.  Her NGT needs to be advanced about 10cm, which I have relayed to the RN.  We will obtain abdominal films in the morning and follow along.  Justeen Hehr E 07/16/2013, 2:47 PM Pager: [phone]

## 2013-07-16 NOTE — Consult Note (Signed)
[facility] Renal Consultation Note  Indication for Consultation:  Management of ESRD/hemodialysis; anemia, hypertension/volume and secondary hyperparathyroidism  HPI: [name] is a 72 y.o. female with ESRD on dialysis on TTS at the [facility] and recurrent diffuse large cell non-Hodgkin's lymphoma (followed by Dr. Myna Hidalgo) who presented to the ED early this morning after sudden onset of abdominal pain, nausea, and vomiting.  Although she continued to pass flatus and reported a small bowel movement yesterday, a CT of the abdomen indicated a high-grade mid small bowel obstruction with transition point in the deep pelvis.  Her obstruction was believed to be partial by Surgery which recommended NG tube placement and conservative management for now.  She recently completed radiation therapy for right preauricular recurrence of large cell non-Hodgkin's lymphoma in August and had follow-up with Dr. Myna Hidalgo on 9/16, at which time she was found to have new right supraorbital swelling.  She was previously followed by Dr. Lowell Guitar for renal failure and began dialysis only recently.  She continued to have weakness and poor appetite and started Megace per Dr. Myna Hidalgo on 9/16.  Currently she has some discomfort with the NG tube, but denies any abdominal pain or nausea.      Dialysis Orders:  TTS @ [facility] 4 hrs   59 kg    2K/2.25Ca    400/800    Heparin 1900 U    L IJ catheter    Hectorol 0     Epogen 7400 U     Venofer 0   Past Medical History  Diagnosis Date  . Hypertension   . Anginal pain   . Headache(784.0)   . Anxiety     h/o anxiety attacks  . Myocardial infarction 2011  . Arthritis     Left hip  . Anemia of renal disease 09/17/2012  . Bleeding   . Blood transfusion without reported diagnosis     hx of  . Acute renal failure   . Claustrophobia   . Panic attacks   . Cancer 11/2009, 04/2013    lymphoma, NHL B cell  . 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 Hx of radiation therapy 04/2013    right supraorbital region   Past Surgical History  Procedure Laterality Date  . Abdominal hysterectomy      30 yrs. ago  . Appendectomy  2009  . Tonsillectomy      age 30  . Cardiac catheterization  2011  . Rotator cuff repair      Right arm  . Esophagogastroduodenoscopy (egd) with propofol  10/06/2012    Procedure: ESOPHAGOGASTRODUODENOSCOPY (EGD) WITH PROPOFOL;  Surgeon: Willis Modena, MD;  Location: [facility];  Service: Endoscopy;  Laterality: N/A;  . Colonoscopy with propofol  10/06/2012    Procedure: COLONOSCOPY WITH PROPOFOL;  Surgeon: Willis Modena, MD;  Location: [facility];  Service: Endoscopy;  Laterality: N/A;  . Colonoscopy with propofol N/A 01/14/2013    Procedure: COLONOSCOPY WITH PROPOFOL;  Surgeon: Willis Modena, MD;  Location: [facility];  Service: Endoscopy;  Laterality: N/A;  . Portacath placement Right   . Portacath placement Right 01/22/2013   Family History  Problem Relation Age of Onset  . Cancer Mother     throat  . Alzheimer's disease Sister   . Cancer Brother     lymphoma  . Cancer Sister     unknown type   Social History She quit smoking cigarettes over 35 years ago and denies any history  of alcohol or illicit drug use.  She previously worked as a Financial trader and currently lives with her husband.  Allergies  Allergen Reactions  . Advicor [Niacin-Lovastatin Er]     Muscle cramps  . Crestor [Rosuvastatin]     Muscle cramps  . Lipitor [Atorvastatin]     Muscle cramps  . Lovastatin     Muscle cramps  . Morphine And Related Other (See Comments)    "made me crazy"  . Niaspan [Niacin Er]     Muscle cramps  . Nsaids     Gi side effects  . Pravachol [Pravastatin Sodium]     Gi side effects  . Sulfa Antibiotics Other (See Comments)    Can't remember   . Vytorin [Ezetimibe-Simvastatin] Nausea Only  . Welchol [Colesevelam Hcl]     Muscle cramps  . Zetia [Ezetimibe]      Muscle cramps   . Zocor [Simvastatin]     Muscle cramps    Prior to Admission medications   Medication Sig Start Date End Date Taking? Authorizing Provider  ALPRAZolam Prudy Feeler) 0.5 MG tablet Take 0.5-1 mg by mouth 3 (three) times daily as needed for sleep or anxiety.   Yes Historical Provider, MD  cloNIDine (CATAPRES) 0.1 MG tablet Take 0.1 mg by mouth 2 (two) times daily.    Yes Historical Provider, MD  docusate sodium 100 MG CAPS Take 200 mg by mouth 3 (three) times daily as needed for constipation. 01/27/13  Yes Josph Macho, MD  esomeprazole (NEXIUM) 40 MG capsule Take 40 mg by mouth daily.     Yes Historical Provider, MD  lidocaine-prilocaine (EMLA) cream Apply 1 application topically as needed (for port-a-cath access).   Yes Historical Provider, MD  megestrol (MEGACE ES) 625 MG/5ML suspension Take 5 mLs (625 mg total) by mouth daily. 07/14/13  Yes Josph Macho, MD  ondansetron (ZOFRAN-ODT) 8 MG disintegrating tablet Take 8 mg by mouth every 8 (eight) hours as needed for nausea.  01/30/13  Yes Historical Provider, MD  OVER THE COUNTER MEDICATION Take 5 mLs by mouth 2 (two) times daily at 10 AM and 5 PM. creamotion cough syrup   Yes Historical Provider, MD  polyethylene glycol powder (MIRALAX) powder Take 17 g by mouth daily.    Yes Historical Provider, MD  Polyvinyl Alcohol-Povidone (CLEAR EYES ALL SEASONS OP) Apply 1 drop to eye daily as needed. For itchy & dry eyes   Yes Historical Provider, MD  traMADol (ULTRAM) 50 MG tablet Take 50-100 mg by mouth every 6 (six) hours as needed for pain.    Yes Historical Provider, MD  prochlorperazine (COMPAZINE) 10 MG tablet Take 10 mg by mouth every 8 (eight) hours as needed (for nausea).  01/27/13   Historical Provider, MD   Labs:  Results for orders placed during the hospital encounter of 07/16/13 (from the past 48 hour(s))  CBC WITH DIFFERENTIAL     Status: Abnormal   Collection Time    07/16/13  1:30 AM      Result Value Range   WBC 9.1   4.0 - 10.5 K/uL   RBC 3.37 (*) 3.87 - 5.11 MIL/uL   Hemoglobin 11.5 (*) 12.0 - 15.0 g/dL   HCT 16.1 (*) 09.6 - 04.5 %   MCV 100.0  78.0 - 100.0 fL   MCH 34.1 (*) 26.0 - 34.0 pg   MCHC 34.1  30.0 - 36.0 g/dL   RDW 40.9 (*) 81.1 - 91.4 %   Platelets 134 (*)  150 - 400 K/uL   Neutrophils Relative % 52  43 - 77 %   Neutro Abs 4.7  1.7 - 7.7 K/uL   Lymphocytes Relative 38  12 - 46 %   Lymphs Abs 3.4  0.7 - 4.0 K/uL   Monocytes Relative 9  3 - 12 %   Monocytes Absolute 0.8  0.1 - 1.0 K/uL   Eosinophils Relative 2  0 - 5 %   Eosinophils Absolute 0.1  0.0 - 0.7 K/uL   Basophils Relative 0  0 - 1 %   Basophils Absolute 0.0  0.0 - 0.1 K/uL  COMPREHENSIVE METABOLIC PANEL     Status: Abnormal   Collection Time    07/16/13  1:30 AM      Result Value Range   Sodium 135  135 - 145 mEq/L   Potassium 4.6  3.5 - 5.1 mEq/L   Chloride 88 (*) 96 - 112 mEq/L   CO2 24  19 - 32 mEq/L   Glucose, Bld 212 (*) 70 - 99 mg/dL   BUN 33 (*) 6 - 23 mg/dL   Creatinine, Ser 4.09 (*) 0.50 - 1.10 mg/dL   Calcium 9.8  8.4 - 81.1 mg/dL   Total Protein 6.9  6.0 - 8.3 g/dL   Albumin 3.7  3.5 - 5.2 g/dL   AST 25  0 - 37 U/L   ALT 12  0 - 35 U/L   Alkaline Phosphatase 57  39 - 117 U/L   Total Bilirubin 0.3  0.3 - 1.2 mg/dL   GFR calc non Af Amer 6 (*) >90 mL/min   GFR calc Af Amer 7 (*) >90 mL/min   Comment: (NOTE)     The eGFR has been calculated using the CKD EPI equation.     This calculation has not been validated in all clinical situations.     eGFR's persistently <90 mL/min signify possible Chronic Kidney     Disease.  LIPASE, BLOOD     Status: Abnormal   Collection Time    07/16/13  1:30 AM      Result Value Range   Lipase 88 (*) 11 - 59 U/L  POCT I-STAT, CHEM 8     Status: Abnormal   Collection Time    07/16/13  2:27 AM      Result Value Range   Sodium 134 (*) 135 - 145 mEq/L   Potassium 4.5  3.5 - 5.1 mEq/L   Chloride 99  96 - 112 mEq/L   BUN 31 (*) 6 - 23 mg/dL   Creatinine, Ser 9.14 (*) 0.50  - 1.10 mg/dL   Glucose, Bld 782 (*) 70 - 99 mg/dL   Calcium, Ion 9.56 (*) 1.13 - 1.30 mmol/L   TCO2 26  0 - 100 mmol/L   Hemoglobin 12.6  12.0 - 15.0 g/dL   HCT 21.3  08.6 - 57.8 %  GLUCOSE, CAPILLARY     Status: Abnormal   Collection Time    07/16/13  8:01 AM      Result Value Range   Glucose-Capillary 128 (*) 70 - 99 mg/dL   Comment 1 Notify RN    CBC     Status: Abnormal   Collection Time    07/16/13 10:30 AM      Result Value Range   WBC 7.3  4.0 - 10.5 K/uL   RBC 2.99 (*) 3.87 - 5.11 MIL/uL   Hemoglobin 10.1 (*) 12.0 - 15.0 g/dL   HCT 46.9 (*) 62.9 -  46.0 %   MCV 101.3 (*) 78.0 - 100.0 fL   MCH 33.8  26.0 - 34.0 pg   MCHC 33.3  30.0 - 36.0 g/dL   RDW 16.1 (*) 09.6 - 04.5 %   Platelets 133 (*) 150 - 400 K/uL  CREATININE, SERUM     Status: Abnormal   Collection Time    07/16/13 10:30 AM      Result Value Range   Creatinine, Ser 6.28 (*) 0.50 - 1.10 mg/dL   GFR calc non Af Amer 6 (*) >90 mL/min   GFR calc Af Amer 7 (*) >90 mL/min   Comment: (NOTE)     The eGFR has been calculated using the CKD EPI equation.     This calculation has not been validated in all clinical situations.     eGFR's persistently <90 mL/min signify possible Chronic Kidney     Disease.   Constitutional: positive for fatigue, negative for chills, fevers and sweats Ears, nose, mouth, throat, and face: negative for earaches, hoarseness, nasal congestion and sore throat Respiratory: negative for cough, dyspnea on exertion, hemoptysis and sputum Cardiovascular: negative for chest pain, chest pressure/discomfort, dyspnea, orthopnea and palpitations Gastrointestinal: positive for abdominal pain, nausea, recent constipation Genitourinary:negative, oliguric Musculoskeletal:negative for arthralgias, back pain, myalgias and neck pain Neurological: positive for weakness, negative for dizziness, gait problems, headaches and speech problems  Physical Exam: Filed Vitals:   07/16/13 1504  BP: 192/85  Pulse:  93  Temp: 98.6 F (37 C)  Resp: 18     General appearance: alert, cooperative and no distress Head: Normocephalic, atraumatic, right supraorbital swelling, NG tube Neck: no adenopathy, no carotid bruit, no JVD and supple, symmetrical, trachea midline Resp: clear to auscultation bilaterally Cardio: regular rate and rhythm with Gr I/VI systolic murmur, no rub GI: soft; bowel sounds normal; mild diffuse tenderness Extremities: extremities normal, atraumatic, no cyanosis or edema Neurologic: Grossly normal Dialysis Access: L IJ catheter   Assessment/Plan: 1. High-grade partial small bowel obstruction - per CT, N & V resolved; NGT with conservative mgt per Surgery, additional CT tomorrow. 2. Large cell non-Hodgkin's lymphoma - s/p chemo, s/p recent radiation for R preauricular recurrence, now with R supraorbital swelling; CT pending per Dr. Myna Hidalgo. 3. ESRD - HD on TTS @ [facility], recent start; K 4.5.  HD tomorrow AM. 4. Hypertension/volume - BP 192/85 on outpatient Amlodipine 10 mg qhs & Metoprolol 50 mg bid, on Clonidine here; wt 64.9 kg with EDW 59, but no signs or symptoms of fluid overload. 5. Anemia - Hgb 10.1 on outpatient Epogen 7400 U, last T-sat 37% with ferritin 1410.  Check CBC pre-HD and give Aranesp as needed. 6. Metabolic bone disease - Last Ca 9.2, P 7.8, iPTH 223; no Hectorol or binders.  Start binder with PO intake. 7. Nutrition - Last Alb 3.9, but still with poor appetite, started Megace per Dr. Myna Hidalgo 2 days ago; currently with NGT. 8. Dialysis access - using L IJ catheter, office appt tomorrow with [facility] for new access evaluation; contacted [facility] to see in hospital.  Pressley Barsky 07/16/2013, 4:11 PM   Attending Nephrologist: Elvis Coil, MD

## 2013-07-16 NOTE — Consult Note (Signed)
Agree with above 

## 2013-07-16 NOTE — ED Provider Notes (Signed)
CSN: [id number]     Arrival date & time 07/16/13  0054 History   First MD Initiated Contact with Patient 07/16/13 0112     Chief Complaint  Patient presents with  . Abdominal Pain  . Nausea  . Emesis   (Consider location/radiation/quality/duration/timing/severity/associated sxs/prior Treatment) HPI History provided by the patient. Onset tonight at home of sharp diffuse abdominal pain with associated nausea and vomiting. Patient has a history of lymphoma and this also recently started dialysis. She scheduled for dialysis in the morning and that her last dialysis on Saturday, 3 days ago as scheduled. No fevers. No chest pain or shortness of breath. No blood in emesis. No bilious emesis. Patient has had symptoms like this in the past, she is not certain of the cause, but believes that it may have been related to her lymphoma. Symptoms moderate to severe, constant since onset, without known alleviating factors.  Past Medical History  Diagnosis Date  . Hypertension   . Anginal pain   . Headache(784.0)   . Anxiety     h/o anxiety attacks  . Myocardial infarction 2011  . Arthritis     Left hip  . Anemia of renal disease 09/17/2012  . Bleeding   . Blood transfusion without reported diagnosis     hx of  . Acute renal failure   . Claustrophobia   . Panic attacks   . Cancer 11/2009, 04/2013    lymphoma, NHL B cell  . 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 Hx of radiation therapy 04/2013    right supraorbital region   Past Surgical History  Procedure Laterality Date  . Abdominal hysterectomy      30 yrs. ago  . Appendectomy  2009  . Tonsillectomy      age 74  . Cardiac catheterization  2011  . Rotator cuff repair      Right arm  . Esophagogastroduodenoscopy (egd) with propofol  10/06/2012    Procedure: ESOPHAGOGASTRODUODENOSCOPY (EGD) WITH PROPOFOL;  Surgeon: Willis Modena, MD;  Location: [facility];  Service: Endoscopy;  Laterality: N/A;  . Colonoscopy  with propofol  10/06/2012    Procedure: COLONOSCOPY WITH PROPOFOL;  Surgeon: Willis Modena, MD;  Location: [facility];  Service: Endoscopy;  Laterality: N/A;  . Colonoscopy with propofol N/A 01/14/2013    Procedure: COLONOSCOPY WITH PROPOFOL;  Surgeon: Willis Modena, MD;  Location: [facility];  Service: Endoscopy;  Laterality: N/A;  . Portacath placement Right   . Portacath placement Right 01/22/2013   Family History  Problem Relation Age of Onset  . Cancer Mother     throat  . Alzheimer's disease Sister   . Cancer Brother     lymphoma  . Cancer Sister     unknown type   History  Substance Use Topics  . Smoking status: Former Smoker -- 0.25 packs/day for 15 years    Quit date: 04/01/1964  . Smokeless tobacco: Never Used  . Alcohol Use: No   OB History   Grav Para Term Preterm Abortions TAB SAB Ect Mult Living                 Review of Systems  Constitutional: Negative for fever and chills.  HENT: Negative for neck pain and neck stiffness.   Eyes: Negative for visual disturbance.  Respiratory: Negative for shortness of breath.   Cardiovascular: Negative for chest pain.  Gastrointestinal: Positive for nausea, vomiting and abdominal pain. Negative for diarrhea.  Genitourinary: Negative for dysuria.  Musculoskeletal: Negative for back pain.  Skin: Negative for rash.  Neurological: Negative for headaches.  All other systems reviewed and are negative.    Allergies  Advicor; Crestor; Lipitor; Lovastatin; Morphine and related; Niaspan; Nsaids; Pravachol; Sulfa antibiotics; Vytorin; Welchol; Zetia; and Zocor  Home Medications   Current Outpatient Rx  Name  Route  Sig  Dispense  Refill  . ALPRAZolam (XANAX) 0.5 MG tablet      Take 1-2 pills, IF NEEDED, for anxiety   90 tablet   0   . cloNIDine (CATAPRES) 0.1 MG tablet   Oral   Take 0.1 mg by mouth 2 (two) times daily.          Marland Kitchen docusate sodium 100 MG CAPS   Oral   Take 200 mg by mouth 3 (three) times daily  as needed for constipation.   60 capsule   2   . esomeprazole (NEXIUM) 40 MG capsule   Oral   Take 40 mg by mouth daily.           Marland Kitchen lidocaine-prilocaine (EMLA) cream   Topical   Apply 1 application topically as needed (for port-a-cath access).         . megestrol (MEGACE ES) 625 MG/5ML suspension   Oral   Take 5 mLs (625 mg total) by mouth daily.   150 mL   4   . ondansetron (ZOFRAN-ODT) 8 MG disintegrating tablet   Oral   Take 8 mg by mouth every 8 (eight) hours as needed for nausea.          . polyethylene glycol powder (MIRALAX) powder   Oral   Take 17 g by mouth as needed. Pt will start taking daily.         . Polyvinyl Alcohol-Povidone (CLEAR EYES ALL SEASONS OP)   Ophthalmic   Apply to eye.         . prochlorperazine (COMPAZINE) 10 MG tablet   Oral   Take 10 mg by mouth every 8 (eight) hours as needed (for nausea).          . traMADol (ULTRAM) 50 MG tablet   Oral   Take 50-100 mg by mouth every 6 (six) hours as needed for pain.           BP 207/83  Pulse 88  Temp(Src) 98.9 F (37.2 C) (Oral)  Resp 18  SpO2 100% Physical Exam  Constitutional: She is oriented to person, place, and time. She appears well-developed and well-nourished.  HENT:  Head: Normocephalic and atraumatic.  Mildly dry mucous membranes  Eyes: EOM are normal. Pupils are equal, round, and reactive to light.  Neck: Neck supple.  Cardiovascular: Normal rate, regular rhythm and intact distal pulses.   Pulmonary/Chest: Effort normal and breath sounds normal. No respiratory distress.  Abdominal: Soft. She exhibits no distension and no mass.  Diffuse tenderness without guarding or rebound  Musculoskeletal: Normal range of motion.  Neurological: She is alert and oriented to person, place, and time.  Skin: Skin is warm and dry.    ED Course  Procedures (including critical care time) Labs Review Labs Reviewed  CBC WITH DIFFERENTIAL - Abnormal; Notable for the following:     RBC 3.37 (*)    Hemoglobin 11.5 (*)    HCT 33.7 (*)    MCH 34.1 (*)    RDW 15.7 (*)    Platelets 134 (*)    All other components within normal limits  COMPREHENSIVE METABOLIC PANEL - Abnormal; Notable for  the following:    Chloride 88 (*)    Glucose, Bld 212 (*)    BUN 33 (*)    Creatinine, Ser 6.05 (*)    GFR calc non Af Amer 6 (*)    GFR calc Af Amer 7 (*)    All other components within normal limits  LIPASE, BLOOD - Abnormal; Notable for the following:    Lipase 88 (*)    All other components within normal limits  POCT I-STAT, CHEM 8 - Abnormal; Notable for the following:    Sodium 134 (*)    BUN 31 (*)    Creatinine, Ser 6.60 (*)    Glucose, Bld 207 (*)    Calcium, Ion 1.01 (*)    All other components within normal limits   Imaging Review Ct Abdomen Pelvis Wo Contrast  07/16/2013   CLINICAL DATA:  Elevated lipase. Diffuse abdominal pain.  EXAM: CT ABDOMEN AND PELVIS WITHOUT CONTRAST  TECHNIQUE: Multidetector CT imaging of the abdomen and pelvis was performed following the standard protocol without intravenous contrast.  COMPARISON:  The 05/28/2013 PET-CT.  FINDINGS: BODY WALL: Unremarkable.  LOWER CHEST:  Mediastinum: Extensive coronary artery atherosclerosis.  Lungs/pleura: No consolidation.  ABDOMEN/PELVIS:  Liver: Numerous calcifications, probable remote granulomatous  Biliary: Layering gallstones. No acute cholecystitis.  Pancreas: Unremarkable.  Spleen: Thick walled subcapsular collection in the lateral spleen, 3.5 cm, compatible with pseudocyst from remote insult.  Adrenals: Thickening and coarse calcifications in the left adrenal gland, stable.  Kidneys and ureters: No hydronephrosis or stone.  Bladder: Unremarkable.  Bowel: Dilated small bowel with fluid levels, leading to the pelvis were there is abrupt caliber change at the level of the vaginal cuff. No mass seen in this region on recent PET-CT 05/28/2013. The dilated distal small bowel has mild mesenteric edematous changes.  No pneumatosis or perforation. Status post appendectomy. 20 x 8 mm lipoma in the cecum.  Retroperitoneum: No mass or adenopathy.  Peritoneum: No pneumoperitoneum.  Reproductive: Hysterectomy.  Vascular: No acute abnormality.  OSSEOUS: Marked diffuse demineralization. No acute abnormality.  IMPRESSION: 1. High-grade mid small bowel obstruction with transition point in the low pelvis, near the vaginal cuff. 2. Cholelithiasis.   Electronically Signed   By: Tiburcio Pea   On: 07/16/2013 04:39     Date: 07/16/2013  Rate: 83  Rhythm: normal sinus rhythm  QRS Axis: normal  Intervals: normal  ST/T Wave abnormalities: nonspecific ST changes  Conduction Disutrbances:nonspecific intraventricular conduction delay  Narrative Interpretation:   Old EKG Reviewed: old ST depressions  IV fentanyl/ zofran CT reviewed with PT and NGT ordered  4:50 AM d/w [facility] Dr Maisie Fus - will plan transfer to [facility] given dialysis PT 5:03 AM d/w Dr Conley Rolls, agrees PT needs to be transferred to [facility]  5:08 AM d/w [facility] Dr Magnus Ivan, recs MED admit and consult when patient is at [facility] MDM  Dx: SBO Labs, CT scan, screening ECG IV narcotics NGT MED admit     Sunnie Nielsen, MD 07/16/13 [phone]

## 2013-07-16 NOTE — ED Notes (Signed)
Per EDP, Dierdre Highman, MD. EKG can be done when pt. Receives pain meds. Pt. Agitated and has severe abdominal pain.

## 2013-07-16 NOTE — Progress Notes (Signed)
Was notified by Barnetta Chapel PA that pt's NGT needed to be advanced 10 cm.  Advanced pt's NGT and placement checked.  Will continue to monitor.

## 2013-07-17 ENCOUNTER — Inpatient Hospital Stay (HOSPITAL_COMMUNITY): Payer: Medicare Other

## 2013-07-17 ENCOUNTER — Ambulatory Visit: Payer: Medicare Other | Admitting: Vascular Surgery

## 2013-07-17 DIAGNOSIS — C8589 Other specified types of non-Hodgkin lymphoma, extranodal and solid organ sites: Secondary | ICD-10-CM

## 2013-07-17 DIAGNOSIS — N19 Unspecified kidney failure: Secondary | ICD-10-CM

## 2013-07-17 DIAGNOSIS — N179 Acute kidney failure, unspecified: Secondary | ICD-10-CM

## 2013-07-17 LAB — GLUCOSE, CAPILLARY
Glucose-Capillary: 93 mg/dL (ref 70–99)
Glucose-Capillary: 109 mg/dL — ABNORMAL HIGH (ref 70–99)
Glucose-Capillary: 107 mg/dL — ABNORMAL HIGH (ref 70–99)

## 2013-07-17 LAB — RENAL FUNCTION PANEL
Albumin: 2.9 g/dL — ABNORMAL LOW (ref 3.5–5.2)
BUN: 43 mg/dL — ABNORMAL HIGH (ref 6–23)
Calcium: 9.1 mg/dL (ref 8.4–10.5)
Creatinine, Ser: 7.7 mg/dL — ABNORMAL HIGH (ref 0.50–1.10)
Glucose, Bld: 86 mg/dL (ref 70–99)
Phosphorus: 5.8 mg/dL — ABNORMAL HIGH (ref 2.3–4.6)
Potassium: 4.3 mEq/L (ref 3.5–5.1)

## 2013-07-17 LAB — CBC
HCT: 29.3 % — ABNORMAL LOW (ref 36.0–46.0)
Hemoglobin: 9.8 g/dL — ABNORMAL LOW (ref 12.0–15.0)
MCH: 33.9 pg (ref 26.0–34.0)
MCHC: 33.4 g/dL (ref 30.0–36.0)
RDW: 15.8 % — ABNORMAL HIGH (ref 11.5–15.5)

## 2013-07-17 LAB — HEPATITIS B SURFACE ANTIGEN: Hepatitis B Surface Ag: NEGATIVE

## 2013-07-17 MED ORDER — SODIUM CHLORIDE 0.9 % IJ SOLN
10.0000 mL | INTRAMUSCULAR | Status: DC | PRN
  Administered 2013-07-17 – 2013-07-18 (×2): 10 mL

## 2013-07-17 MED ORDER — PENTAFLUOROPROP-TETRAFLUOROETH EX AERO: 1.0000 "application " | INHALATION_SPRAY | CUTANEOUS | Status: DC | PRN

## 2013-07-17 MED ORDER — SODIUM CHLORIDE 0.9 % IV SOLN: 100.0000 mL | INTRAVENOUS | Status: DC | PRN

## 2013-07-17 MED ORDER — METOPROLOL TARTRATE 50 MG PO TABS
50.0000 mg | ORAL_TABLET | Freq: Two times a day (BID) | ORAL | Status: DC
  Administered 2013-07-17 – 2013-07-18 (×2): 50 mg via ORAL
  Filled 2013-07-17 (×3): qty 1

## 2013-07-17 MED ORDER — ALTEPLASE 2 MG IJ SOLR
2.0000 mg | Freq: Once | INTRAMUSCULAR | Status: DC | PRN
  Filled 2013-07-17: qty 2

## 2013-07-17 MED ORDER — PROCHLORPERAZINE MALEATE 10 MG PO TABS
10.0000 mg | ORAL_TABLET | Freq: Three times a day (TID) | ORAL | Status: DC | PRN
  Filled 2013-07-17: qty 1

## 2013-07-17 MED ORDER — METOPROLOL TARTRATE 1 MG/ML IV SOLN: 2.5000 mg | Freq: Four times a day (QID) | INTRAVENOUS | Status: DC | PRN

## 2013-07-17 MED ORDER — LIDOCAINE HCL (PF) 1 % IJ SOLN: 5.0000 mL | INTRAMUSCULAR | Status: DC | PRN

## 2013-07-17 MED ORDER — DARBEPOETIN ALFA-POLYSORBATE 60 MCG/0.3ML IJ SOLN
60.0000 ug | INTRAMUSCULAR | Status: DC
  Administered 2013-07-18: 60 ug via INTRAVENOUS
  Filled 2013-07-17: qty 0.3

## 2013-07-17 MED ORDER — NEPRO/CARBSTEADY PO LIQD: 237.0000 mL | ORAL | Status: DC | PRN

## 2013-07-17 MED ORDER — LIDOCAINE-PRILOCAINE 2.5-2.5 % EX CREA: 1.0000 "application " | TOPICAL_CREAM | CUTANEOUS | Status: DC | PRN

## 2013-07-17 MED ORDER — HEPARIN SODIUM (PORCINE) 1000 UNIT/ML DIALYSIS: 1000.0000 [IU] | INTRAMUSCULAR | Status: DC | PRN

## 2013-07-17 MED ORDER — CLONIDINE HCL 0.1 MG PO TABS
0.1000 mg | ORAL_TABLET | Freq: Two times a day (BID) | ORAL | Status: DC
  Administered 2013-07-17 – 2013-07-18 (×2): 0.1 mg via ORAL
  Filled 2013-07-17 (×3): qty 1

## 2013-07-17 MED ORDER — CLONIDINE HCL 0.1 MG PO TABS: 0.1000 mg | ORAL_TABLET | Freq: Two times a day (BID) | ORAL | Status: DC

## 2013-07-17 MED ORDER — HYDRALAZINE HCL 20 MG/ML IJ SOLN
5.0000 mg | Freq: Four times a day (QID) | INTRAMUSCULAR | Status: DC | PRN
  Filled 2013-07-17: qty 0.25

## 2013-07-17 NOTE — Progress Notes (Signed)
Patient ID: [name], female   DOB: Sep 22, 1941, 72 y.o.   MRN: [id number]    Subjective: Pt feels well today.  Still passing flatus  Objective: Vital signs in last 24 hours: Temp:  [98.2 F (36.8 C)-98.6 F (37 C)] 98.2 F (36.8 C) (09/19 0645) Pulse Rate:  [69-125] 111 (09/19 1030) Resp:  [18-20] 20 (09/19 0645) BP: (133-206)/(71-106) 146/86 mmHg (09/19 1030) SpO2:  [98 %-100 %] 98 % (09/19 0645) Weight:  [134 lb 14.4 oz (61.19 kg)-137 lb 2 oz (62.2 kg)] 137 lb 2 oz (62.2 kg) (09/19 0645) Last BM Date: 07/16/13  Intake/Output from previous day: 09/18 0701 - 09/19 0700 In: 1135.8 [I.V.:1035.8; NG/GT:100] Out: 151 [Urine:150; Stool:1] Intake/Output this shift:    PE: Abd: soft, NT, ND, +BS, NGT with no output right now in HD, none recorded on I&Os  Lab Results:   Recent Labs  07/16/13 1030 07/17/13 0500  WBC 7.3 5.9  HGB 10.1* 9.8*  HCT 30.3* 29.3*  PLT 133* 145*   BMET  Recent Labs  07/16/13 0130 07/16/13 0227 07/16/13 1030 07/17/13 0500  NA 135 134*  --  138  K 4.6 4.5  --  4.3  CL 88* 99  --  95*  CO2 24  --   --  24  GLUCOSE 212* 207*  --  86  BUN 33* 31*  --  43*  CREATININE 6.05* 6.60* 6.28* 7.70*  CALCIUM 9.8  --   --  9.1   PT/INR No results found for this basename: LABPROT, INR,  in the last 72 hours CMP     Component Value Date/Time   NA 138 07/17/2013 0500   NA 137 07/14/2013 1112   K 4.3 07/17/2013 0500   K 4.3 07/14/2013 1112   CL 95* 07/17/2013 0500   CL 92* 07/14/2013 1112   CO2 24 07/17/2013 0500   CO2 33 07/14/2013 1112   GLUCOSE 86 07/17/2013 0500   GLUCOSE 139* 07/14/2013 1112   BUN 43* 07/17/2013 0500   BUN 11 07/14/2013 1112   CREATININE 7.70* 07/17/2013 0500   CREATININE 3.4* 07/14/2013 1112   CALCIUM 9.1 07/17/2013 0500   CALCIUM 9.5 07/14/2013 1112   PROT 6.9 07/16/2013 0130   PROT 6.6 07/14/2013 1112   ALBUMIN 2.9* 07/17/2013 0500   AST 25 07/16/2013 0130   AST 21 07/14/2013 1112   ALT 12 07/16/2013 0130   ALT 19 07/14/2013  1112   ALKPHOS 57 07/16/2013 0130   ALKPHOS 48 07/14/2013 1112   BILITOT 0.3 07/16/2013 0130   BILITOT 0.60 07/14/2013 1112   GFRNONAA 5* 07/17/2013 0500   GFRAA 5* 07/17/2013 0500   Lipase     Component Value Date/Time   LIPASE 88* 07/16/2013 0130       Studies/Results: Ct Abdomen Pelvis Wo Contrast  07/16/2013   CLINICAL DATA:  Elevated lipase. Diffuse abdominal pain.  EXAM: CT ABDOMEN AND PELVIS WITHOUT CONTRAST  TECHNIQUE: Multidetector CT imaging of the abdomen and pelvis was performed following the standard protocol without intravenous contrast.  COMPARISON:  The 05/28/2013 PET-CT.  FINDINGS: BODY WALL: Unremarkable.  LOWER CHEST:  Mediastinum: Extensive coronary artery atherosclerosis.  Lungs/pleura: No consolidation.  ABDOMEN/PELVIS:  Liver: Numerous calcifications, probable remote granulomatous  Biliary: Layering gallstones. No acute cholecystitis.  Pancreas: Unremarkable.  Spleen: Thick walled subcapsular collection in the lateral spleen, 3.5 cm, compatible with pseudocyst from remote insult.  Adrenals: Thickening and coarse calcifications in the left adrenal gland, stable.  Kidneys and ureters: No  hydronephrosis or stone.  Bladder: Unremarkable.  Bowel: Dilated small bowel with fluid levels, leading to the pelvis were there is abrupt caliber change at the level of the vaginal cuff. No mass seen in this region on recent PET-CT 05/28/2013. The dilated distal small bowel has mild mesenteric edematous changes. No pneumatosis or perforation. Status post appendectomy. 20 x 8 mm lipoma in the cecum.  Retroperitoneum: No mass or adenopathy.  Peritoneum: No pneumoperitoneum.  Reproductive: Hysterectomy.  Vascular: No acute abnormality.  OSSEOUS: Marked diffuse demineralization. No acute abnormality.  IMPRESSION: 1. High-grade mid small bowel obstruction with transition point in the low pelvis, near the vaginal cuff. 2. Cholelithiasis.   Electronically Signed   By: Tiburcio Pea   On: 07/16/2013 04:39    Dg Abd Portable 2v  07/17/2013   CLINICAL DATA:  Small bowel obstruction.  EXAM: PORTABLE ABDOMEN - 2 VIEW  COMPARISON:  CT of the abdomen and pelvis on 07/16/2013.  FINDINGS: A nasogastric tube has been placed and extends into the stomach. There is complete resolution of small bowel dilatation with no radiographic evidence obstruction identified currently. Ingested oral contrast at the time of CT is now identified in the colon which is decompressed. No free air is identified.  IMPRESSION: Complete resolution of small bowel obstruction. Oral contrast transit noted into the colon.   Electronically Signed   By: Irish Lack   On: 07/17/2013 07:54    Anti-infectives: Anti-infectives   None       Assessment/Plan  1. PSBO 2. ESRD, HD 3. NHL, B cell  Plan: 1. Clinically and radiographically patient's PSBO seems to be resolved.  Will dc NGT today and allow clear liquids.  Will follow.   LOS: 1 day    Dannelle Rhymes E 07/17/2013, 10:44 AM Pager: [phone]

## 2013-07-17 NOTE — Progress Notes (Signed)
07/17/13 Pt.is A/Ox4, is on room air with a NG tube going at low intermittent suction. Drainage is light brown but transparent in color and is thick. She uses the bedpan. She had c/o headache pain and prn pain medication was given. Pt.BP also checked and was elevated. Mid level provider with [facility] was called and notified at 0635 new orders written. Pt.also went to hemodialysis this am around [phone].

## 2013-07-17 NOTE — Progress Notes (Signed)
[facility] PROGRESS NOTE  Assessment/Plan:  Nausea and vomiting due to SBO (small bowel obstruction): - D/C NG tube, clear liq diet. - electrolytes stable.  ESRD on hemodialysis - renal has been notified. - HD in am.   Protein-calorie malnutrition, severe - nutrition consult.  Type II or unspecified type diabetes mellitus with ketoacidosis, not stated as uncontrolled - SSI.  Anemia of renal disease - cont Aranesp and  IV iron per renal.   Large cell (diffuse) non-Hodgkin's lymphoma - follow up with onc as an outpatient.   Code Status: full Family Communication: none  Disposition Plan: inpatient   Consultants:  Renal  surgery  Procedures:  CT abd and pelvis  Antibiotics:  None  HPI/Subjective: Abdominal pain resolved. Having BM.  Objective: Filed Vitals:   07/17/13 1000 07/17/13 1030 07/17/13 1105 07/17/13 1115  BP: 162/81 146/86 144/81 168/69  Pulse: 118 111 112 102  Temp:   98 F (36.7 C)   TempSrc:   Oral   Resp:   20   Height:      Weight:    60.5 kg (133 lb 6.1 oz)  SpO2:    99%    Intake/Output Summary (Last 24 hours) at 07/17/13 1221 Last data filed at 07/17/13 1115  Gross per 24 hour  Intake 1135.83 ml  Output   2001 ml  Net -865.17 ml   Filed Weights   07/17/13 0440 07/17/13 0645 07/17/13 1115  Weight: 61.19 kg (134 lb 14.4 oz) 62.2 kg (137 lb 2 oz) 60.5 kg (133 lb 6.1 oz)    Exam:  General: Alert, awake, oriented x3, in no acute distress.  HEENT: No bruits, no goiter.  Heart: Regular rate and rhythm, without murmurs, rubs, gallops.  Lungs: Good air movement, bilateral air movement.  Abdomen: Soft, diffuse no tenderness, nondistended, positive bowel sounds.     Data Reviewed: Basic Metabolic Panel:  Recent Labs Lab 07/14/13 1112 07/16/13 0130 07/16/13 0227 07/16/13 1030 07/17/13 0500  NA 137 135 134*  --  138  K 4.3 4.6 4.5  --  4.3  CL 92* 88* 99  --  95*  CO2 33 24  --   --  24  GLUCOSE 139* 212* 207*   --  86  BUN 11 33* 31*  --  43*  CREATININE 3.4* 6.05* 6.60* 6.28* 7.70*  CALCIUM 9.5 9.8  --   --  9.1  PHOS  --   --   --   --  5.8*   Liver Function Tests:  Recent Labs Lab 07/14/13 1112 07/16/13 0130 07/17/13 0500  AST 21 25  --   ALT 19 12  --   ALKPHOS 48 57  --   BILITOT 0.60 0.3  --   PROT 6.6 6.9  --   ALBUMIN  --  3.7 2.9*    Recent Labs Lab 07/16/13 0130  LIPASE 88*   No results found for this basename: AMMONIA,  in the last 168 hours CBC:  Recent Labs Lab 07/14/13 1112 07/16/13 0130 07/16/13 0227 07/16/13 1030 07/17/13 0500  WBC 5.1 9.1  --  7.3 5.9  NEUTROABS 1.6 4.7  --   --   --   HGB 11.4* 11.5* 12.6 10.1* 9.8*  HCT 34.8 33.7* 37.0 30.3* 29.3*  MCV 106* 100.0  --  101.3* 101.4*  PLT 88* 134*  --  133* 145*   Cardiac Enzymes: No results found for this basename: CKTOTAL, CKMB, CKMBINDEX, TROPONINI,  in the last 168 hours  BNP (last 3 results) No results found for this basename: PROBNP,  in the last 8760 hours CBG:  Recent Labs Lab 07/16/13 0801 07/16/13 2040 07/17/13 0104 07/17/13 0443 07/17/13 1144  GLUCAP 128* 105* 93 89 109*    No results found for this or any previous visit (from the past 240 hour(s)).   Studies: Ct Abdomen Pelvis Wo Contrast  07/16/2013   CLINICAL DATA:  Elevated lipase. Diffuse abdominal pain.  EXAM: CT ABDOMEN AND PELVIS WITHOUT CONTRAST  TECHNIQUE: Multidetector CT imaging of the abdomen and pelvis was performed following the standard protocol without intravenous contrast.  COMPARISON:  The 05/28/2013 PET-CT.  FINDINGS: BODY WALL: Unremarkable.  LOWER CHEST:  Mediastinum: Extensive coronary artery atherosclerosis.  Lungs/pleura: No consolidation.  ABDOMEN/PELVIS:  Liver: Numerous calcifications, probable remote granulomatous  Biliary: Layering gallstones. No acute cholecystitis.  Pancreas: Unremarkable.  Spleen: Thick walled subcapsular collection in the lateral spleen, 3.5 cm, compatible with pseudocyst from remote  insult.  Adrenals: Thickening and coarse calcifications in the left adrenal gland, stable.  Kidneys and ureters: No hydronephrosis or stone.  Bladder: Unremarkable.  Bowel: Dilated small bowel with fluid levels, leading to the pelvis were there is abrupt caliber change at the level of the vaginal cuff. No mass seen in this region on recent PET-CT 05/28/2013. The dilated distal small bowel has mild mesenteric edematous changes. No pneumatosis or perforation. Status post appendectomy. 20 x 8 mm lipoma in the cecum.  Retroperitoneum: No mass or adenopathy.  Peritoneum: No pneumoperitoneum.  Reproductive: Hysterectomy.  Vascular: No acute abnormality.  OSSEOUS: Marked diffuse demineralization. No acute abnormality.  IMPRESSION: 1. High-grade mid small bowel obstruction with transition point in the low pelvis, near the vaginal cuff. 2. Cholelithiasis.   Electronically Signed   By: Tiburcio Pea   On: 07/16/2013 04:39   Dg Abd Portable 2v  07/17/2013   CLINICAL DATA:  Small bowel obstruction.  EXAM: PORTABLE ABDOMEN - 2 VIEW  COMPARISON:  CT of the abdomen and pelvis on 07/16/2013.  FINDINGS: A nasogastric tube has been placed and extends into the stomach. There is complete resolution of small bowel dilatation with no radiographic evidence obstruction identified currently. Ingested oral contrast at the time of CT is now identified in the colon which is decompressed. No free air is identified.  IMPRESSION: Complete resolution of small bowel obstruction. Oral contrast transit noted into the colon.   Electronically Signed   By: Irish Lack   On: 07/17/2013 07:54    Scheduled Meds: . cloNIDine  0.1 mg Oral BID  . [START ON 07/18/2013] darbepoetin (ARANESP) injection - DIALYSIS  60 mcg Intravenous Q Sat-HD  . heparin  5,000 Units Subcutaneous Q8H  . insulin aspart  0-9 Units Subcutaneous Q4H  . metoprolol tartrate  50 mg Oral BID  . pantoprazole  40 mg Oral Daily   Continuous Infusions: . dextrose 5 % and  0.9% NaCl 50 mL/hr at 07/17/13 0416     Marinda Elk  [facility] Pager [phone]. If 8PM-8AM, please contact night-coverage at [email or web address], password [email or web address] 07/17/2013, 12:21 PM  LOS: 1 day

## 2013-07-17 NOTE — Progress Notes (Signed)
VASCULAR LAB PRELIMINARY  PRELIMINARY  PRELIMINARY  PRELIMINARY  Right  Upper Extremity Vein Map    Cephalic  Segment Diameter Depth Comment  1. Axilla 0.104 mm 19 mm   2. Mid upper arm 1.75 mm 17.5 mm   3. Above AC 1.92 mm 9.2 mm   4. In AC 3.82 mm mm   5. Below AC 3.24 mm mm   6. Mid forearm 3.32 mm mm Branch   7. Wrist 2.7 mm mm    mm mm    mm mm    mm mm    Basilic  Segment Diameter Depth Comment  1. Axilla 6.77 mm 14 mm   2. Mid upper arm 5.32 mm 12 mm Large communicating branch to brachial measures 4.12 mm  3. Above AC 4.37 mm 9.9 mm  Branch measures 3.48. mm  I followed this branch into the forearm.  Distal measurements from here.  4. In AC 2.19 mm 13 mm   5. Below AC 2.34 mm 8 mm   6. Mid forearm 2.14 mm 13 mm   7. Wrist mm mm    mm mm    mm mm    mm mm    Left Upper Extremity Vein Map    Cephalic  Segment Diameter Depth Comment  1. Axilla 3.33 mm  mm   2. Mid upper arm 3.83 mm mm   3. Above AC 3.28 mm mm   4. In AC 3.33 mm mm   5. Below AC 1.99 mm mm Branch meaures 1.9 mm  6. Mid forearm 2.25 mm mm   7. Wrist 3.08 mm mm    mm mm    mm mm    mm mm    Basilic  Segment Diameter Depth Comment  1. Axilla 5.85 mm 12 mm   2. Mid upper arm 4.92 mm 15 mm   3. Above AC 4.93 mm 13.6 mm   4. In AC 2.14 mm mm Multiple branching from here distally  5. Below AC 2.32 mm mm   6. Mid forearm 1.12mm mm   7. Wrist mm mm    mm mm    mm mm    mm mm     [name], RVT 07/17/2013, 3:32 PM

## 2013-07-17 NOTE — Progress Notes (Signed)
Agree with above 

## 2013-07-17 NOTE — Progress Notes (Signed)
[facility] [place]  Will be by to see this patient once her medical status stabilizes.  The pSBO may require surgery, so access intervention is not indicated at this time as she has a [facility] TDC.  Leonides Sake, MD [facility] [place] Office: [phone] Pager: [phone]  07/17/2013, 7:29 AM

## 2013-07-17 NOTE — Progress Notes (Addendum)
Subjective:  No complaints now of abdominal pain or distension NG in place In HD now via [facility]  Objective Vital signs in last 24 hours: Filed Vitals:   07/17/13 0705 07/17/13 0730 07/17/13 0800 07/17/13 0830  BP: 191/86 193/89 175/89 145/83  Pulse: 69 77 81 117  Temp:      TempSrc:      Resp:      Height:      Weight:      SpO2:       Weight change:   Intake/Output Summary (Last 24 hours) at 07/17/13 0916 Last data filed at 07/17/13 0700  Gross per 24 hour  Intake 1135.83 ml  Output    151 ml  Net 984.83 ml   Physical Exam:  Blood pressure 145/83, pulse 117, temperature 98.2 F (36.8 C), temperature source Oral, resp. rate 20, height 5\' 2"  (1.575 m), weight 62.2 kg (137 lb 2 oz), SpO2 98.00%. Lungs grossly clear Tachy 110-115 sinus tachy on tele Lungs clear Abdomen with some mild left lower quadrant tenderness with deep palpation Extremities without edema TDC at 400 BFR  Weight trending (EDW 59) 07/17/13 0645 62.2 kg (pre HD)   Recent Labs Lab 07/14/13 1112 07/16/13 0130 07/16/13 0227 07/16/13 1030 07/17/13 0500  NA 137 135 134*  --  138  K 4.3 4.6 4.5  --  4.3  CL 92* 88* 99  --  95*  CO2 33 24  --   --  24  GLUCOSE 139* 212* 207*  --  86  BUN 11 33* 31*  --  43*  CREATININE 3.4* 6.05* 6.60* 6.28* 7.70*  ALB 3.4  --   --   --   --   CALCIUM 9.5 9.8  --   --  9.1  PHOS  --   --   --   --  5.8*   Liver Function Tests:  Recent Labs Lab 07/14/13 1112 07/16/13 0130 07/17/13 0500  AST 21 25  --   ALT 19 12  --   ALKPHOS 48 57  --   BILITOT 0.60 0.3  --   PROT 6.6 6.9  --   ALBUMIN  --  3.7 2.9*    Recent Labs Lab 07/16/13 0130  LIPASE 88*    Recent Labs Lab 07/14/13 1112 07/16/13 0130 07/16/13 0227 07/16/13 1030 07/17/13 0500  WBC 5.1 9.1  --  7.3 5.9  NEUTROABS 1.6 4.7  --   --   --   HGB 11.4* 11.5* 12.6 10.1* 9.8*  HCT 34.8 33.7* 37.0 30.3* 29.3*  MCV 106* 100.0  --  101.3* 101.4*  PLT 88* 134*  --  133* 145*    Recent  Labs Lab 07/16/13 0801 07/16/13 2040 07/17/13 0104 07/17/13 0443  GLUCAP 128* 105* 93 89   Studies/Results: Ct Abdomen Pelvis Wo Contrast  07/16/2013   CLINICAL DATA:  Elevated lipase. Diffuse abdominal pain.  EXAM: CT ABDOMEN AND PELVIS WITHOUT CONTRAST  TECHNIQUE: Multidetector CT imaging of the abdomen and pelvis was performed following the standard protocol without intravenous contrast.  COMPARISON:  The 05/28/2013 PET-CT.  FINDINGS: BODY WALL: Unremarkable.  LOWER CHEST:  Mediastinum: Extensive coronary artery atherosclerosis.  Lungs/pleura: No consolidation.  ABDOMEN/PELVIS:  Liver: Numerous calcifications, probable remote granulomatous  Biliary: Layering gallstones. No acute cholecystitis.  Pancreas: Unremarkable.  Spleen: Thick walled subcapsular collection in the lateral spleen, 3.5 cm, compatible with pseudocyst from remote insult.  Adrenals: Thickening and coarse calcifications in the left adrenal gland, stable.  Kidneys and ureters: No hydronephrosis or stone.  Bladder: Unremarkable.  Bowel: Dilated small bowel with fluid levels, leading to the pelvis were there is abrupt caliber change at the level of the vaginal cuff. No mass seen in this region on recent PET-CT 05/28/2013. The dilated distal small bowel has mild mesenteric edematous changes. No pneumatosis or perforation. Status post appendectomy. 20 x 8 mm lipoma in the cecum.  Retroperitoneum: No mass or adenopathy.  Peritoneum: No pneumoperitoneum.  Reproductive: Hysterectomy.  Vascular: No acute abnormality.  OSSEOUS: Marked diffuse demineralization. No acute abnormality.  IMPRESSION: 1. High-grade mid small bowel obstruction with transition point in the low pelvis, near the vaginal cuff. 2. Cholelithiasis.   Electronically Signed   By: Tiburcio Pea   On: 07/16/2013 04:39   Dg Abd Portable 2v  07/17/2013   CLINICAL DATA:  Small bowel obstruction.  EXAM: PORTABLE ABDOMEN - 2 VIEW  COMPARISON:  CT of the abdomen and pelvis on  07/16/2013.  FINDINGS: A nasogastric tube has been placed and extends into the stomach. There is complete resolution of small bowel dilatation with no radiographic evidence obstruction identified currently. Ingested oral contrast at the time of CT is now identified in the colon which is decompressed. No free air is identified.  IMPRESSION: Complete resolution of small bowel obstruction. Oral contrast transit noted into the colon.   Electronically Signed   By: Irish Lack   On: 07/17/2013 07:54   Medications: . dextrose 5 % and 0.9% NaCl 50 mL/hr at 07/17/13 0416   . cloNIDine  0.1 mg Transdermal Weekly  . heparin  5,000 Units Subcutaneous Q8H  . insulin aspart  0-9 Units Subcutaneous Q4H  . pantoprazole  40 mg Oral Daily  . sodium chloride  10-40 mL Intracatheter Q12H   Dialysis Orders: TTS @ [facility]  4 hrs 59 kg 2K/2.25Ca 400/800 Heparin 1900 U L IJ catheter  Hectorol 0 Epogen 7400 U Venofer 0    I  have reviewed scheduled and prn medications.  Assessment/Plan:  1. High-grade partial small bowel obstruction - per CT, N & V resolved; NGT with conservative mgt per Surgery, additional CT today. 2. Large cell non-Hodgkin's lymphoma - s/p chemo, s/p recent radiation for R preauricular recurrence, now with R supraorbital swelling; CT pending per Dr. Myna Hidalgo. 3. ESRD - HD on TTS @ [facility], recent start; K 4.5. HD today off schedule, another short treatment tomorrow to get back on schedule.. 4. Hypertension/volume - BP 192/85 on outpatient Amlodipine 10 mg qhs & Metoprolol 50 mg bid, on Clonidine here; wt 64.9 kg with EDW 59, but no signs or symptoms of fluid overload. EDW seems too low to me. Quite tachy but possible d/t being off her BB.  Will use prn lopressor 2.5 Q6H prn SBP >160 and surgery has indicated they will be pulling NG so will resume home BP medications 5. Anemia - Hgb 10.1 on outpatient Epogen 7400 U, last T-sat 37% with ferritin 1410. Check CBC pre-HD and give Aranesp as needed. (Restart  tomorrow with Saturday HD) 6. Metabolic bone disease - Last Ca 9.2, P 7.8, iPTH 223; no Hectorol or binders. Start binder when PO intake. 7. Nutrition - Last Alb 3.9, but still with poor appetite, started Megace per Dr. Myna Hidalgo 2 days ago; currently with NGT. 8. Dialysis access - using L IJ catheter, office appt tomorrow with [facility] for new access evaluation; contacted [facility] to see in hospital. They will see "once her medical status stabilizes" (per Dr. Nicky Pugh note)  Diana Bal, MD [facility] [phone] pager 07/17/2013, 9:16 AM

## 2013-07-17 NOTE — Procedures (Signed)
I have personally attended this patient's dialysis session.  Due to heart rate 120 and no evidence of volume excess on exam have lowered goal to 2 liters.  TDC 400 BFR.    [name] B

## 2013-07-18 ENCOUNTER — Inpatient Hospital Stay (HOSPITAL_COMMUNITY): Payer: Medicare Other

## 2013-07-18 DIAGNOSIS — E1110 Type 2 diabetes mellitus with ketoacidosis without coma: Secondary | ICD-10-CM

## 2013-07-18 LAB — CBC
Hemoglobin: 8.8 g/dL — ABNORMAL LOW (ref 12.0–15.0)
MCH: 34.6 pg — ABNORMAL HIGH (ref 26.0–34.0)
MCHC: 33 g/dL (ref 30.0–36.0)
Platelets: 113 10*3/uL — ABNORMAL LOW (ref 150–400)
RBC: 2.54 MIL/uL — ABNORMAL LOW (ref 3.87–5.11)
WBC: 4.4 10*3/uL (ref 4.0–10.5)

## 2013-07-18 LAB — RENAL FUNCTION PANEL
CO2: 31 mEq/L (ref 19–32)
Calcium: 8.2 mg/dL — ABNORMAL LOW (ref 8.4–10.5)
GFR calc Af Amer: 11 mL/min — ABNORMAL LOW (ref >90)
GFR calc non Af Amer: 9 mL/min — ABNORMAL LOW (ref >90)
Glucose, Bld: 114 mg/dL — ABNORMAL HIGH (ref 70–99)
Phosphorus: 3.5 mg/dL (ref 2.3–4.6)
Potassium: 4.1 mEq/L (ref 3.5–5.1)
Sodium: 137 mEq/L (ref 135–145)

## 2013-07-18 LAB — GLUCOSE, CAPILLARY
Glucose-Capillary: 93 mg/dL (ref 70–99)
Glucose-Capillary: 144 mg/dL — ABNORMAL HIGH (ref 70–99)

## 2013-07-18 MED ORDER — DARBEPOETIN ALFA-POLYSORBATE 60 MCG/0.3ML IJ SOLN
INTRAMUSCULAR | Status: AC
  Filled 2013-07-18: qty 0.3

## 2013-07-18 MED ORDER — HEPARIN SODIUM (PORCINE) 1000 UNIT/ML IJ SOLN
1100.0000 [IU] | Freq: Once | INTRAMUSCULAR | Status: AC
  Administered 2013-07-18: 1100 [IU] via INTRAVENOUS

## 2013-07-18 MED ORDER — LANTHANUM CARBONATE 500 MG PO CHEW
1000.0000 mg | CHEWABLE_TABLET | Freq: Three times a day (TID) | ORAL | Status: DC
  Filled 2013-07-18 (×2): qty 2

## 2013-07-18 NOTE — Discharge Summary (Signed)
Physician Discharge Summary  [name]:[id number] DOB: 07/18/1941 DOA: 07/16/2013  PCP: [name], MD  Admit date: 07/16/2013 Discharge date: 07/18/2013  Time spent: 35 minutes  Recommendations for Outpatient Follow-up:  1. Follow up with Dr. Twanna Hy as an outpatient. 2. Need to get an Open MRI as pt refused to get a closed MRI at [facility].  Discharge Diagnoses:  Principal Problem:   SBO (small bowel obstruction) Active Problems:   Large cell (diffuse) non-Hodgkin's lymphoma   Anemia of renal disease   Nausea and vomiting   Type II or unspecified type diabetes mellitus with ketoacidosis, not stated as uncontrolled   Constipation due to pain medication   Hx of radiation therapy   ESRD on hemodialysis   Protein-calorie malnutrition, severe   Discharge Condition: stable  Diet recommendation: Renal   Filed Weights   07/17/13 0645 07/17/13 1115 07/18/13 0623  Weight: 62.2 kg (137 lb 2 oz) 60.5 kg (133 lb 6.1 oz) 59.8 kg (131 lb 13.4 oz)    History of present illness:  72 y.o. female with hx of lymphoma (Dr Myna Hidalgo), ESRD on HD (Tu, [place], Sat), HTN, DM2, s/p radiation thx to right face, presents to the ER with abdominal pain for one day. She also has some nausea, but denied any fever or chills. She has passed flatus and had a small amount of bowel movement today. Evaluation in the ER included an abdominal pelvic CT without constrast showing small bowel obtruction with transition point. Her lipase was increased to 88. She has normal WBC and is non acidotic. Hospitalist was asked to admit her for SBO. Because of her dialysis requirement, she will need to be transferred to [facility]. Dr Magnus Ivan was consulted by the EDP, but he was in a trauma and requested surgical consult when she arrives at [facility]. Of note, she was scheduled to have a AV fistula placement later this week.    Hospital Course:  Nausea and vomiting due to SBO (small bowel obstruction):  - NG tube placed  and treated conservatively. Surgery consulted - by the next day pt was passing gas and having BM. - D/C NG tube, started clear liq diet and tolerate it.  - electrolytes stable.   ESRD on hemodialysis  - renal has been notified.  - HD in am.   Protein-calorie malnutrition, severe  - nutrition consult.   Type II or unspecified type diabetes mellitus with ketoacidosis, not stated as uncontrolled  - SSI.   Anemia of renal disease  - cont Aranesp and IV iron per renal.   Large cell (diffuse) non-Hodgkin's lymphoma  - follow up with onc as an outpatient. - Oncology consulted rec. CT head showed: asymmetric soft tissue swelling within the right  supraorbital region, d/w her the need to get MRI she refused as she could not tolerate it. She wanted it to get an open MRI as an outpatient.   Procedures:  CT head 9.19.2014   Consultations:  renla  Oncology  Discharge Exam: Filed Vitals:   07/18/13 0623  BP: 127/67  Pulse: 66  Temp: 97.7 F (36.5 C)  Resp: 20    General: A&O x3 Cardiovascular: RRR Respiratory: good air movement CTA B/l  Discharge Instructions      Discharge Orders   Future Appointments Provider Department Dept Phone   07/22/2013 12:00 PM [facility]l-Ct 2 [facility]AG[facility] [phone]   Patient to arrive 15 minutes prior to exam.   07/27/2013 2:20 PM Billie Lade, MD  [facility] RADIATION ONCOLOGY [phone]   08/04/2013 9:30 AM Rachael Fee [facility] [place] [phone]   08/04/2013 9:45 AM Josph Macho, MD [facility] [place] [phone]   Future Orders Complete By Expires   Diet - low sodium heart healthy  As directed    Increase activity slowly  As directed        Medication List         ALPRAZolam 0.5 MG tablet  Commonly known as:  XANAX  Take 0.5-1 mg by mouth 3 (three) times daily as needed for sleep or anxiety.     CLEAR EYES ALL SEASONS OP  Apply 1  drop to eye daily as needed. For itchy & dry eyes     cloNIDine 0.1 MG tablet  Commonly known as:  CATAPRES  Take 0.1 mg by mouth 2 (two) times daily.     DSS 100 MG Caps  Take 200 mg by mouth 3 (three) times daily as needed for constipation.     esomeprazole 40 MG capsule  Commonly known as:  NEXIUM  Take 40 mg by mouth daily.     lidocaine-prilocaine cream  Commonly known as:  EMLA  Apply 1 application topically as needed (for port-a-cath access).     megestrol 625 MG/5ML suspension  Commonly known as:  MEGACE ES  Take 5 mLs (625 mg total) by mouth daily.     metoprolol 50 MG tablet  Commonly known as:  LOPRESSOR  Take 50 mg by mouth 2 (two) times daily.     MIRALAX powder  Generic drug:  polyethylene glycol powder  Take 17 g by mouth daily.     ondansetron 8 MG disintegrating tablet  Commonly known as:  ZOFRAN-ODT  Take 8 mg by mouth every 8 (eight) hours as needed for nausea.     OVER THE COUNTER MEDICATION  Take 5 mLs by mouth 2 (two) times daily at 10 AM and 5 PM. creamotion cough syrup     prochlorperazine 10 MG tablet  Commonly known as:  COMPAZINE  Take 10 mg by mouth every 8 (eight) hours as needed (for nausea).     traMADol 50 MG tablet  Commonly known as:  ULTRAM  Take 50-100 mg by mouth every 6 (six) hours as needed for pain.       Allergies  Allergen Reactions  . Advicor [Niacin-Lovastatin Er]     Muscle cramps  . Crestor [Rosuvastatin]     Muscle cramps  . Lipitor [Atorvastatin]     Muscle cramps  . Lovastatin     Muscle cramps  . Morphine And Related Other (See Comments)    "made me crazy"  . Niaspan [Niacin Er]     Muscle cramps  . Nsaids     Gi side effects  . Pravachol [Pravastatin Sodium]     Gi side effects  . Sulfa Antibiotics Other (See Comments)    Can't remember   . Vytorin [Ezetimibe-Simvastatin] Nausea Only  . Welchol [Colesevelam Hcl]     Muscle cramps  . Zetia [Ezetimibe]     Muscle cramps   . Zocor [Simvastatin]      Muscle cramps    Follow-up Information   Follow up with Josph Macho, MD In 1 week. (hospital follow up)    Specialty:  Oncology   Contact information:   [address] [place] [address] [phone]  The results of significant diagnostics from this hospitalization (including imaging, microbiology, ancillary and laboratory) are listed below for reference.    Significant Diagnostic Studies: Ct Abdomen Pelvis Wo Contrast  07/16/2013   CLINICAL DATA:  Elevated lipase. Diffuse abdominal pain.  EXAM: CT ABDOMEN AND PELVIS WITHOUT CONTRAST  TECHNIQUE: Multidetector CT imaging of the abdomen and pelvis was performed following the standard protocol without intravenous contrast.  COMPARISON:  The 05/28/2013 PET-CT.  FINDINGS: BODY WALL: Unremarkable.  LOWER CHEST:  Mediastinum: Extensive coronary artery atherosclerosis.  Lungs/pleura: No consolidation.  ABDOMEN/PELVIS:  Liver: Numerous calcifications, probable remote granulomatous  Biliary: Layering gallstones. No acute cholecystitis.  Pancreas: Unremarkable.  Spleen: Thick walled subcapsular collection in the lateral spleen, 3.5 cm, compatible with pseudocyst from remote insult.  Adrenals: Thickening and coarse calcifications in the left adrenal gland, stable.  Kidneys and ureters: No hydronephrosis or stone.  Bladder: Unremarkable.  Bowel: Dilated small bowel with fluid levels, leading to the pelvis were there is abrupt caliber change at the level of the vaginal cuff. No mass seen in this region on recent PET-CT 05/28/2013. The dilated distal small bowel has mild mesenteric edematous changes. No pneumatosis or perforation. Status post appendectomy. 20 x 8 mm lipoma in the cecum.  Retroperitoneum: No mass or adenopathy.  Peritoneum: No pneumoperitoneum.  Reproductive: Hysterectomy.  Vascular: No acute abnormality.  OSSEOUS: Marked diffuse demineralization. No acute abnormality.  IMPRESSION: 1. High-grade mid small bowel  obstruction with transition point in the low pelvis, near the vaginal cuff. 2. Cholelithiasis.   Electronically Signed   By: Tiburcio Pea   On: 07/16/2013 04:39   Ct Head Wo Contrast  07/18/2013   *RADIOLOGY REPORT*  Clinical Data: Recurrent swelling about right eye, question of recurrent non-Hodgkin's lymphoma.  CT HEAD WITHOUT CONTRAST  Technique:  Contiguous axial images were obtained from the base of the skull through the vertex without contrast.  Comparison: Prior CT from 05/22/2013  Findings: There is subtle asymmetric soft tissue swelling within the right forehead just above the right eye, which measures approximately 1.7 x 0.5 cm (series 2, image 6). This finding is new as compared to the prior CT from 05/22/2013. This soft tissue abuts the outer table of the supraorbital rim. The underlying osseous cortex is intact with normal appearance.  The right orbit and right lacrimal gland are normal and symmetric with the contralateral left globe and lacrimal gland.  No intracranial extension is identified.  In the brain, mild diffuse cortical atrophy with chronic microvascular ischemic changes are again seen, not significantly changed.  No acute ischemic hemorrhage or infarct identified.  No midline shift or mass lesion.  No extra-axial fluid collection. Gray-white matter differentiation is well maintained.  Mastoid air cells and paranasal sinuses are clear.  IMPRESSION:  1.  Subtle asymmetric soft tissue swelling within the right supraorbital region as above.  This finding is of uncertain clinical significance, and further evaluation with MRI may be helpful for further characterization.  In addition, histopathologic correlation could be performed to evaluate for possible recurrent non-Hodgkins lymphoma.  2.  Stable appearance of age-related atrophy and chronic microvascular ischemic disease.  No acute intracranial process.   Original Report Authenticated By: Rise Mu, M.D.   Dg Abd Portable  2v  07/17/2013   CLINICAL DATA:  Small bowel obstruction.  EXAM: PORTABLE ABDOMEN - 2 VIEW  COMPARISON:  CT of the abdomen and pelvis on 07/16/2013.  FINDINGS: A nasogastric tube has been placed and extends into the stomach. There is  complete resolution of small bowel dilatation with no radiographic evidence obstruction identified currently. Ingested oral contrast at the time of CT is now identified in the colon which is decompressed. No free air is identified.  IMPRESSION: Complete resolution of small bowel obstruction. Oral contrast transit noted into the colon.   Electronically Signed   By: Irish Lack   On: 07/17/2013 07:54    Microbiology: No results found for this or any previous visit (from the past 240 hour(s)).   Labs: Basic Metabolic Panel:  Recent Labs Lab 07/14/13 1112 07/16/13 0130 07/16/13 0227 07/16/13 1030 07/17/13 0500  NA 137 135 134*  --  138  K 4.3 4.6 4.5  --  4.3  CL 92* 88* 99  --  95*  CO2 33 24  --   --  24  GLUCOSE 139* 212* 207*  --  86  BUN 11 33* 31*  --  43*  CREATININE 3.4* 6.05* 6.60* 6.28* 7.70*  CALCIUM 9.5 9.8  --   --  9.1  PHOS  --   --   --   --  5.8*   Liver Function Tests:  Recent Labs Lab 07/14/13 1112 07/16/13 0130 07/17/13 0500  AST 21 25  --   ALT 19 12  --   ALKPHOS 48 57  --   BILITOT 0.60 0.3  --   PROT 6.6 6.9  --   ALBUMIN  --  3.7 2.9*    Recent Labs Lab 07/16/13 0130  LIPASE 88*   No results found for this basename: AMMONIA,  in the last 168 hours CBC:  Recent Labs Lab 07/14/13 1112 07/16/13 0130 07/16/13 0227 07/16/13 1030 07/17/13 0500  WBC 5.1 9.1  --  7.3 5.9  NEUTROABS 1.6 4.7  --   --   --   HGB 11.4* 11.5* 12.6 10.1* 9.8*  HCT 34.8 33.7* 37.0 30.3* 29.3*  MCV 106* 100.0  --  101.3* 101.4*  PLT 88* 134*  --  133* 145*   Cardiac Enzymes: No results found for this basename: CKTOTAL, CKMB, CKMBINDEX, TROPONINI,  in the last 168 hours BNP: BNP (last 3 results) No results found for this  basename: PROBNP,  in the last 8760 hours CBG:  Recent Labs Lab 07/17/13 1144 07/17/13 1611 07/17/13 2120 07/18/13 0031 07/18/13 0529  GLUCAP 109* 154* 107* 85 93     Signed:  FELIZ ORTIZ, ABRAHAM  [facility] 07/18/2013, 8:18 AM

## 2013-07-18 NOTE — Progress Notes (Signed)
Subjective: Continues to pass gas.  Having full liquids for breakfast.  Objective: Vital signs in last 24 hours: Temp:  [97.7 F (36.5 C)-98.6 F (37 C)] 97.7 F (36.5 C) (09/20 0623) Pulse Rate:  [66-125] 66 (09/20 0623) Resp:  [18-20] 20 (09/20 0623) BP: (102-175)/(56-92) 127/67 mmHg (09/20 0623) SpO2:  [98 %-99 %] 98 % (09/19 2058) Weight:  [131 lb 13.4 oz (59.8 kg)-133 lb 6.1 oz (60.5 kg)] 131 lb 13.4 oz (59.8 kg) (09/20 0623) Last BM Date: 07/16/13  Intake/Output from previous day: 09/19 0701 - 09/20 0700 In: 360 [P.O.:360] Out: 2150 [Urine:100; Emesis/NG output:200] Intake/Output this shift:    PE: General- In NAD Abdomen-soft, nontender, nondistended  Lab Results:   Recent Labs  07/16/13 1030 07/17/13 0500  WBC 7.3 5.9  HGB 10.1* 9.8*  HCT 30.3* 29.3*  PLT 133* 145*   BMET  Recent Labs  07/16/13 0130 07/16/13 0227 07/16/13 1030 07/17/13 0500  NA 135 134*  --  138  K 4.6 4.5  --  4.3  CL 88* 99  --  95*  CO2 24  --   --  24  GLUCOSE 212* 207*  --  86  BUN 33* 31*  --  43*  CREATININE 6.05* 6.60* 6.28* 7.70*  CALCIUM 9.8  --   --  9.1   PT/INR No results found for this basename: LABPROT, INR,  in the last 72 hours Comprehensive Metabolic Panel:    Component Value Date/Time   NA 138 07/17/2013 0500   NA 137 07/14/2013 1112   K 4.3 07/17/2013 0500   K 4.3 07/14/2013 1112   CL 95* 07/17/2013 0500   CL 92* 07/14/2013 1112   CO2 24 07/17/2013 0500   CO2 33 07/14/2013 1112   BUN 43* 07/17/2013 0500   BUN 11 07/14/2013 1112   CREATININE 7.70* 07/17/2013 0500   CREATININE 3.4* 07/14/2013 1112   GLUCOSE 86 07/17/2013 0500   GLUCOSE 139* 07/14/2013 1112   CALCIUM 9.1 07/17/2013 0500   CALCIUM 9.5 07/14/2013 1112   AST 25 07/16/2013 0130   AST 21 07/14/2013 1112   ALT 12 07/16/2013 0130   ALT 19 07/14/2013 1112   ALKPHOS 57 07/16/2013 0130   ALKPHOS 48 07/14/2013 1112   BILITOT 0.3 07/16/2013 0130   BILITOT 0.60 07/14/2013 1112   PROT 6.9 07/16/2013 0130   PROT 6.6 07/14/2013 1112   ALBUMIN 2.9* 07/17/2013 0500     Studies/Results: Ct Head Wo Contrast  07/18/2013   *RADIOLOGY REPORT*  Clinical Data: Recurrent swelling about right eye, question of recurrent non-Hodgkin's lymphoma.  CT HEAD WITHOUT CONTRAST  Technique:  Contiguous axial images were obtained from the base of the skull through the vertex without contrast.  Comparison: Prior CT from 05/22/2013  Findings: There is subtle asymmetric soft tissue swelling within the right forehead just above the right eye, which measures approximately 1.7 x 0.5 cm (series 2, image 6). This finding is new as compared to the prior CT from 05/22/2013. This soft tissue abuts the outer table of the supraorbital rim. The underlying osseous cortex is intact with normal appearance.  The right orbit and right lacrimal gland are normal and symmetric with the contralateral left globe and lacrimal gland.  No intracranial extension is identified.  In the brain, mild diffuse cortical atrophy with chronic microvascular ischemic changes are again seen, not significantly changed.  No acute ischemic hemorrhage or infarct identified.  No midline shift or mass lesion.  No extra-axial fluid collection. Gray-white  matter differentiation is well maintained.  Mastoid air cells and paranasal sinuses are clear.  IMPRESSION:  1.  Subtle asymmetric soft tissue swelling within the right supraorbital region as above.  This finding is of uncertain clinical significance, and further evaluation with MRI may be helpful for further characterization.  In addition, histopathologic correlation could be performed to evaluate for possible recurrent non-Hodgkins lymphoma.  2.  Stable appearance of age-related atrophy and chronic microvascular ischemic disease.  No acute intracranial process.   Original Report Authenticated By: Rise Mu, M.D.   Dg Abd Portable 2v  07/17/2013   CLINICAL DATA:  Small bowel obstruction.  EXAM: PORTABLE ABDOMEN - 2 VIEW   COMPARISON:  CT of the abdomen and pelvis on 07/16/2013.  FINDINGS: A nasogastric tube has been placed and extends into the stomach. There is complete resolution of small bowel dilatation with no radiographic evidence obstruction identified currently. Ingested oral contrast at the time of CT is now identified in the colon which is decompressed. No free air is identified.  IMPRESSION: Complete resolution of small bowel obstruction. Oral contrast transit noted into the colon.   Electronically Signed   By: Irish Lack   On: 07/17/2013 07:54    Anti-infectives: Anti-infectives   None      Assessment Principal Problem:   SBO (small bowel obstruction)-resolving. Active Problems:     LOS: 2 days   Plan: Advance to soft diet.  Please call us if SBO sxs recur.   [name] J 07/18/2013

## 2013-07-18 NOTE — Consult Note (Signed)
NAMEMarland Kitchen  LIYAT, FAULKENBERRY NO.:  [id number]  MEDICAL RECORD NO.:  [id number]  LOCATION:  4E06C                        FACILITY:  [facility]  PHYSICIAN:  Josph Macho, M.D.  DATE OF BIRTH:  08-17-1941  DATE OF CONSULTATION:  07/17/2013 DATE OF DISCHARGE:                                CONSULTATION   REFERRING PHYSICIAN:  Marinda Elk, M.D.  REASON FOR CONSULTATION: 1. Bowel obstruction. 2. Recurrent large cell non-Hodgkin lymphoma. 3. Renal failure on hemodialysis.  HISTORY OF PRESENT ILLNESS:  Ms. Heuberger who is well known to me.  She is a very nice 72 year old white female.  She has history of recurrent non-Hodgkin lymphoma.  She was last seen by me a few days before admission.  She has gotten radiation therapy for her recurrence.  Her recurrence has been up in the face.  Her main problem has been renal failure.  She finally got on to dialysis.  She has been doing okay with dialysis.  She was seen, again, back on the 16th.  She was noted to have a new swelling over right eye. We were to get her followed up with another CT scan and possible radiation therapy.  She was admitted with onset of abdominal pain.  She was found have a bowel obstruction.  A CT scan done of the abdomen and pelvis on admission showed dilated bowel filled with fluid levels.  There is high- grade mid small bowel obstruction.  The transition point was in the low pelvis.  She had some gallstones.  There is no evidence of obvious lymphoma.  Her lab when she came in showed a white cell count 9.1, hemoglobin 11.5, hematocrit 33.7, platelet count was 134.  BUN was 33, creatinine 6.05. Glucose was 212.  She had NG tube placed.  Her bowel obstruction resolved.  She is now back on liquids.  There is still some abdominal discomfort.  She has had no obvious fever.  There has been no obvious bleeding. We were asked to see her because of her history of lymphoma.  PAST MEDICAL HISTORY:   Remarkable for; 1. Recurrent non-Hodgkin lymphoma. 2. Hypertension. 3. Diabetes. 4. Acute renal failure on hemodialysis. 5. Coronary artery disease. 6. History of abdominal hysterectomy. 7. History of appendectomy.  ALLERGIES: 1. Numerous to statin drugs. 2. Morphine. 3. NSAIDs. 4. Sulfa antibiotics. 5. Zetia. 6. Niacin.  MEDICATIONS:  Her admission medications are documented in the record.  SOCIAL HISTORY:  Negative for tobacco use.  There is no alcohol use. She has no obvious occupational exposures.  FAMILY HISTORY:  Noncontributory.  There is history of lymphoma in a brother.  Mother had neck cancer.  REVIEW OF SYSTEMS:  As stated in history of present illness.  Again no additional findings are noted.  PHYSICAL EXAMINATION:  GENERAL:  This is a fairly well-developed, well- nourished, white female, in no obvious distress. VITAL SIGNS:  Temperature of 97.7, pulse 66, respiratory rate 20, blood pressure 127/67. HEAD AND NECK EXAM:  Normocephalic, atraumatic skull.  There are no ocular or oral lesions.  There are no palpable cervical or supraclavicular lymph nodes.  Does show slight swelling in the right supraorbital region.  This is slightly  tender. LUNGS:  Clear bilaterally. CARDIAC:  Regular rate and rhythm with a normal S1 and S2.  There are no murmurs, rubs, or bruits. ABDOMEN:  Soft.  She has good bowel sounds.  There is no fluid wave. There is no palpable hepatosplenomegaly.  She has some slight tenderness to palpation in the epigastric region. BACK: No tenderness of the spine, ribs, or hips. EXTREMITIES:  No clubbing, cyanosis, or edema.  LABORATORY:  White cell count 5.9, hemoglobin 9.8, hematocrit 29.3, platelet count 145.  Albumin is 2.9.  Calcium 9.1.  CT of the head does show a new soft tissue swelling right above the right eye.  This measures 1.7 x 0.5 cm.  This is a new finding.  There is soft tissue but the outer table of the supraorbital rim.   The underlying Hodgkin cortex is intact.  No obvious intracranial lesions are noted.  IMPRESSION:  Ms. Sinning is a 72 year old female with history of recurrent non-Hodgkin lymphoma.  Her first recurrence was treated with systemic chemotherapy which she tolerated very very poorly.  She was hospitalized for about 3 or 4 weeks because of side effects.  She subsequently developed renal failure.  Her second and third recurrences have been treated with radiation therapy as they appeared to have been local recurrences.  It looks like she has another recurrence.  I am not sure we really need to pursue biopsies or other scans.  I am not sure if radiation therapy could be given.  I will have to check with Radiation Oncology about this.  Ms. Ucci actually looks better than I thought.  She has NG tube out. She had clear liquids and wants to have more solid food.  Hopefully, she will be able to go home over the weekend.  I will follow up with her in the office as scheduled.  I do not see anything special that needs to be done from a oncological point of view with respect to her bowel obstruction.  This was transient.  One has to suspect that this might have been from adhesions from past abdominal surgery.  Thanks so much for taking good care of Ms. Albright.  I do very much appreciate this.     Josph Macho, M.D.     PRE/MEDQ  D:  07/18/2013  T:  07/18/2013  Job:  [id number]

## 2013-07-18 NOTE — Progress Notes (Signed)
The patient is A&Ox4, but states that she is forgetful at times.  The patient stated that she was anxious at the beginning of the night shift and wanted something to help her sleep.  She slept throughout most of the night with 1 mg of Xanax.  She did not have any complaints of N/V, but stated that her abdomen was "still a little tender".  Her bowel sounds were hypoactive, but present.  The CT of her head was completed overnight.

## 2013-07-18 NOTE — Progress Notes (Signed)
Subjective:  "waiting for Hemodialysis" tolerated diet with BMs this am Noted plans for dc later today Objective Vital signs in last 24 hours: Filed Vitals:   07/17/13 2058 07/17/13 2114 07/18/13 0623 07/18/13 1008  BP: 102/56 112/56 127/67 114/68  Pulse: 88  66   Temp: 98.2 F (36.8 C)  97.7 F (36.5 C)   TempSrc: Oral  Oral   Resp: 20  20   Height:      Weight:   59.8 kg (131 lb 13.4 oz)   SpO2: 98%      Weight change: -4.4 kg (-9 lb 11.2 oz)  Intake/Output Summary (Last 24 hours) at 07/18/13 1045 Last data filed at 07/17/13 2121  Gross per 24 hour  Intake    360 ml  Output   2150 ml  Net  -1790 ml   Labs: Basic Metabolic Panel:  Recent Labs Lab 07/14/13 1112  07/16/13 0130 07/16/13 0227 07/16/13 1030 07/17/13 0500  NA 137  --  135 134*  --  138  K 4.3  --  4.6 4.5  --  4.3  CL 92*  --  88* 99  --  95*  CO2 33  --  24  --   --  24  GLUCOSE 139*  --  212* 207*  --  86  BUN 11  --  33* 31*  --  43*  CREATININE 3.4*  < > 6.05* 6.60* 6.28* 7.70*  CALCIUM 9.5  --  9.8  --   --  9.1  ALB 3.4  --   --   --   --   --   PHOS  --   --   --   --   --  5.8*  < > = values in this interval not displayed. Liver Function Tests:  Recent Labs Lab 07/14/13 1112 07/16/13 0130 07/17/13 0500  AST 21 25  --   ALT 19 12  --   ALKPHOS 48 57  --   BILITOT 0.60 0.3  --   PROT 6.6 6.9  --   ALBUMIN  --  3.7 2.9*    Recent Labs Lab 07/16/13 0130  LIPASE 88*   No results found for this basename: AMMONIA,  in the last 168 hours CBC:  Recent Labs Lab 07/14/13 1112  07/16/13 0130 07/16/13 0227 07/16/13 1030 07/17/13 0500  WBC 5.1  --  9.1  --  7.3 5.9  NEUTROABS 1.6  --  4.7  --   --   --   HGB 11.4*  < > 11.5* 12.6 10.1* 9.8*  HCT 34.8  < > 33.7* 37.0 30.3* 29.3*  MCV 106*  --  100.0  --  101.3* 101.4*  PLT 88*  --  134*  --  133* 145*  < > = values in this interval not displayed. CBG:  Recent Labs Lab 07/17/13 1144 07/17/13 1611 07/17/13 2120 07/18/13 0031  07/18/13 0529  GLUCAP 109* 154* 107* 85 93    Iron Studies: No results found for this basename: IRON, TIBC, TRANSFERRIN, FERRITIN,  in the last 72 hours Studies/Results: Ct Head Wo Contrast  07/18/2013   *RADIOLOGY REPORT*  Clinical Data: Recurrent swelling about right eye, question of recurrent non-Hodgkin's lymphoma.  CT HEAD WITHOUT CONTRAST  Technique:  Contiguous axial images were obtained from the base of the skull through the vertex without contrast.  Comparison: Prior CT from 05/22/2013  Findings: There is subtle asymmetric soft tissue swelling within the right forehead just above  the right eye, which measures approximately 1.7 x 0.5 cm (series 2, image 6). This finding is new as compared to the prior CT from 05/22/2013. This soft tissue abuts the outer table of the supraorbital rim. The underlying osseous cortex is intact with normal appearance.  The right orbit and right lacrimal gland are normal and symmetric with the contralateral left globe and lacrimal gland.  No intracranial extension is identified.  In the brain, mild diffuse cortical atrophy with chronic microvascular ischemic changes are again seen, not significantly changed.  No acute ischemic hemorrhage or infarct identified.  No midline shift or mass lesion.  No extra-axial fluid collection. Gray-white matter differentiation is well maintained.  Mastoid air cells and paranasal sinuses are clear.  IMPRESSION:  1.  Subtle asymmetric soft tissue swelling within the right supraorbital region as above.  This finding is of uncertain clinical significance, and further evaluation with MRI may be helpful for further characterization.  In addition, histopathologic correlation could be performed to evaluate for possible recurrent non-Hodgkins lymphoma.  2.  Stable appearance of age-related atrophy and chronic microvascular ischemic disease.  No acute intracranial process.   Original Report Authenticated By: Rise Mu, M.D.   Dg Abd  Portable 2v  07/17/2013   CLINICAL DATA:  Small bowel obstruction.  EXAM: PORTABLE ABDOMEN - 2 VIEW  COMPARISON:  CT of the abdomen and pelvis on 07/16/2013.  FINDINGS: A nasogastric tube has been placed and extends into the stomach. There is complete resolution of small bowel dilatation with no radiographic evidence obstruction identified currently. Ingested oral contrast at the time of CT is now identified in the colon which is decompressed. No free air is identified.  IMPRESSION: Complete resolution of small bowel obstruction. Oral contrast transit noted into the colon.   Electronically Signed   By: Irish Lack   On: 07/17/2013 07:54   Medications: . dextrose 5 % and 0.9% NaCl Stopped (07/17/13 1309)   . cloNIDine  0.1 mg Oral BID  . darbepoetin (ARANESP) injection - DIALYSIS  60 mcg Intravenous Q Sat-HD  . heparin  5,000 Units Subcutaneous Q8H  . insulin aspart  0-9 Units Subcutaneous Q4H  . metoprolol tartrate  50 mg Oral BID  . pantoprazole  40 mg Oral Daily    Physical Exam: General: Alert, NAD , appropriate Heart: RRR, no rub or murmur Lungs: CTA bilatwerally Abdomen: BS + = normal , soft, nontendetr Extremities: Dialysis Access: trace bipedal edema,/   L IJ Perm Cath   Dialysis Orders: TTS @ [facility]  4 hrs 59 kg 2K/2.25Ca 400/800 Heparin 1900 U L IJ catheter  Hectorol 0 Epogen 7400 U Venofer 0   Problem/Plan:  1. High-grade partial small bowel obstruction - Resolved and noted plans for dc 2. 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 Myna Hidalgo saw this am with  Fu in his ov as he noted 3. ESRD - HD on TTS @ [facility], recent start;HD today to keep on Schedule. I dw Dr. Lowell Guitar yesterday and now he does not think renal Bx needed as she is not regaining any function since Initially followed in March 2014 4. Hypertension/volume - BP 114/68 on  Metoprolol 50 mg bid and  Clonidine .1mg  bid; wt 60.5 kg after hd yesterday and EDW 59 Trace pedal edma. Fu wts and bp today , may need slightly  Higher edw and taper meds down  5.  Anemia - Hgb 10.1 > 9.8 on outpatient Epogen 7400 U, last T-sat 37% with ferritin 1410. Check CBC pre-HD and give Aranesp  On hd today 6. Metabolic bone disease -  Ca 9.1, P 5.8, iPTH 223; no Hectorol or binders. Start binder .. 7. Nutrition - Alb 29, she tells me now having good appetite, started Megace per Dr. Myna Hidalgo as outpt this week.. 8. Dialysis access - using L IJ catheter, Vein mapping done yesterday , I will contact [facility] for apt wed in their ov .    Lenny Pastel, PA-C [facility] Beeper [phone] 07/18/2013,10:45 AM  LOS: 2 days  I have seen and examined this patient and agree with plan as outline in the above note.  For dialysis, then discharge today.  Dr. Lowell Guitar relooked at situation and has decided renal biopsy not likely to be helpful at this point.  We will arrange for access appt at [facility] offices.Camille Bal B,MD 07/18/2013 11:18 AM

## 2013-07-18 NOTE — Consult Note (Signed)
#   [id number] is consult note.  Pete E.

## 2013-07-21 ENCOUNTER — Telehealth: Payer: Self-pay | Admitting: Vascular Surgery

## 2013-07-21 ENCOUNTER — Other Ambulatory Visit (HOSPITAL_COMMUNITY): Payer: Medicare Other

## 2013-07-21 NOTE — Telephone Encounter (Addendum)
Message copied by Fredrich Birks on Tue Jul 21, 2013 11:46 AM ------      Message from: Melene Plan      Created: Tue Jul 21, 2013 10:12 AM       Renal wants this pt seen possible Wed so we can do surgery Friday or Monday. She saw [facility] & had vein mapping in the hospital. She dialyzes at [facility] st on T,Th,S      Thanks      Darel Hong      ----- Message -----         From: Fransisco Hertz, MD         Sent: 07/21/2013   9:33 AM           To: Melene Plan, RN            Needs to be seen in clinic.        ----- Message -----         From: Melene Plan, RN         Sent: 07/21/2013   9:28 AM           To: Fransisco Hertz, MD            Renal would like [place] to schedule an AVF or AVG as soon as possible. They got her vein mapped before she left the hospital this weekend. Do I need to schedule an office visit this week or can you look at the vein map & tell me what to schedule?       Thanks      Darel Hong             ------  Scheduled to see [facility] on 07/22/13 @ 915- unable to reach pt at home. I called [facility] to ask them to relay message to Ms Krotz today at her dialysis time. Charge nurse will call me when patient arrives to their office, dpm

## 2013-07-22 ENCOUNTER — Encounter (HOSPITAL_COMMUNITY): Payer: Self-pay | Admitting: Pharmacy Technician

## 2013-07-22 ENCOUNTER — Encounter (HOSPITAL_COMMUNITY): Payer: Self-pay | Admitting: *Deleted

## 2013-07-22 ENCOUNTER — Encounter: Payer: Self-pay | Admitting: Vascular Surgery

## 2013-07-22 ENCOUNTER — Other Ambulatory Visit: Payer: Self-pay

## 2013-07-22 ENCOUNTER — Ambulatory Visit (INDEPENDENT_AMBULATORY_CARE_PROVIDER_SITE_OTHER): Payer: Medicare Other | Admitting: Vascular Surgery

## 2013-07-22 ENCOUNTER — Ambulatory Visit (HOSPITAL_COMMUNITY)
Admission: RE | Admit: 2013-07-22 | Discharge: 2013-07-22 | Disposition: A | Discharge status: Home / Self Care | Payer: Medicare Other | Source: Ambulatory Visit | Attending: Hematology & Oncology | Admitting: Hematology & Oncology

## 2013-07-22 VITALS — BP 154/80 | HR 71 | Ht 62.0 in | Wt 138.0 lb

## 2013-07-22 DIAGNOSIS — R64 Cachexia: Secondary | ICD-10-CM

## 2013-07-22 DIAGNOSIS — H059 Unspecified disorder of orbit: Secondary | ICD-10-CM | POA: Insufficient documentation

## 2013-07-22 DIAGNOSIS — D509 Iron deficiency anemia, unspecified: Secondary | ICD-10-CM

## 2013-07-22 DIAGNOSIS — N186 End stage renal disease: Secondary | ICD-10-CM

## 2013-07-22 DIAGNOSIS — C8589 Other specified types of non-Hodgkin lymphoma, extranodal and solid organ sites: Secondary | ICD-10-CM | POA: Insufficient documentation

## 2013-07-22 DIAGNOSIS — C8330 Diffuse large B-cell lymphoma, unspecified site: Secondary | ICD-10-CM

## 2013-07-22 DIAGNOSIS — R609 Edema, unspecified: Secondary | ICD-10-CM | POA: Insufficient documentation

## 2013-07-22 DIAGNOSIS — F410 Panic disorder [episodic paroxysmal anxiety] without agoraphobia: Secondary | ICD-10-CM

## 2013-07-22 NOTE — Progress Notes (Signed)
[facility] [place]  Patient name: [name] MRN: [id number] DOB: 1941-07-09 Sex: female  REASON FOR VISIT: evaluate for hemodialysis access.  HPI: [name] is a 72 y.o. female who was recently in the hospital with a bowel obstruction. She has a left IJ tunneled dialysis catheter that was placed at the outpatient center. We were asked to evaluate her for permanent access. She is right-handed. She has recovered from her bowel obstruction. She does have some chronic fatigue. He's had anorexia but this is improving. She has some dyspnea on exertion.  Past Medical History  Diagnosis Date  . Hypertension   . Anginal pain   . Headache(784.0)   . Anxiety     h/o anxiety attacks  . Myocardial infarction 2011  . Arthritis     Left hip  . Anemia of renal disease 09/17/2012  . Bleeding   . Blood transfusion without reported diagnosis     hx of  . Acute renal failure   . Claustrophobia   . Panic attacks   . Cancer 11/2009, 04/2013    lymphoma, NHL B cell  . 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 Hx of radiation therapy 04/2013    right supraorbital region   Family History  Problem Relation Age of Onset  . Cancer Mother     throat  . Alzheimer's disease Sister   . Cancer Brother     lymphoma  . Cancer Sister     unknown type   SOCIAL HISTORY: History  Substance Use Topics  . Smoking status: Former Smoker -- 0.25 packs/day for 15 years    Quit date: 04/01/1964  . Smokeless tobacco: Never Used  . Alcohol Use: No   Allergies  Allergen Reactions  . Advicor [Niacin-Lovastatin Er]     Muscle cramps  . Crestor [Rosuvastatin]     Muscle cramps  . Lipitor [Atorvastatin]     Muscle cramps  . Lovastatin     Muscle cramps  . Morphine And Related Other (See Comments)    "made me crazy"  . Niaspan [Niacin Er]     Muscle cramps  . Nsaids     Gi side effects  . Pravachol [Pravastatin Sodium]     Gi side effects  .  Sulfa Antibiotics Other (See Comments)    Can't remember   . Vytorin [Ezetimibe-Simvastatin] Nausea Only  . Welchol [Colesevelam Hcl]     Muscle cramps  . Zetia [Ezetimibe]     Muscle cramps   . Zocor [Simvastatin]     Muscle cramps    Current Outpatient Prescriptions  Medication Sig Dispense Refill  . ALPRAZolam (XANAX) 0.5 MG tablet Take 0.5-1 mg by mouth 3 (three) times daily as needed for sleep or anxiety.      . cloNIDine (CATAPRES) 0.1 MG tablet Take 0.1 mg by mouth 2 (two) times daily.       Marland Kitchen docusate sodium 100 MG CAPS Take 200 mg by mouth 3 (three) times daily as needed for constipation.  60 capsule  2  . esomeprazole (NEXIUM) 40 MG capsule Take 40 mg by mouth daily.        Marland Kitchen lidocaine-prilocaine (EMLA) cream Apply 1 application topically as needed (for port-a-cath access).      . megestrol (MEGACE ES) 625 MG/5ML suspension Take 5 mLs (625 mg total) by mouth daily.  150 mL  4  . metoprolol (LOPRESSOR) 50 MG tablet Take 50 mg by mouth  2 (two) times daily.      . ondansetron (ZOFRAN-ODT) 8 MG disintegrating tablet Take 8 mg by mouth every 8 (eight) hours as needed for nausea.       Marland Kitchen OVER THE COUNTER MEDICATION Take 5 mLs by mouth 2 (two) times daily at 10 AM and 5 PM. creamotion cough syrup      . polyethylene glycol powder (MIRALAX) powder Take 17 g by mouth daily.       . Polyvinyl Alcohol-Povidone (CLEAR EYES ALL SEASONS OP) Apply 1 drop to eye daily as needed. For itchy & dry eyes      . prochlorperazine (COMPAZINE) 10 MG tablet Take 10 mg by mouth every 8 (eight) hours as needed (for nausea).       . traMADol (ULTRAM) 50 MG tablet Take 50-100 mg by mouth every 6 (six) hours as needed for pain.        No current facility-administered medications for this visit.   REVIEW OF SYSTEMS: Arly.Keller ] denotes positive finding; [  ] denotes negative finding  CARDIOVASCULAR:  [ ]  chest pain   [ ]  chest pressure   [ ]  palpitations   [ ]  orthopnea   Arly.Keller ] dyspnea on exertion   [ ]   claudication   [ ]  rest pain   [ ]  DVT   [ ]  phlebitis PULMONARY:   [ ]  productive cough   [ ]  asthma   [ ]  wheezing NEUROLOGIC:   Arly.Keller ] weakness  [ ]  paresthesias  [ ]  aphasia  [ ]  amaurosis  [ ]  dizziness HEMATOLOGIC:   [ ]  bleeding problems   [ ]  clotting disorders MUSCULOSKELETAL:  [ ]  joint pain   [ ]  joint swelling [ ]  leg swelling GASTROINTESTINAL: [ ]   blood in stool  [ ]   hematemesis GENITOURINARY:  [ ]   dysuria  [ ]   hematuria PSYCHIATRIC:  [ ]  history of major depression INTEGUMENTARY:  [ ]  rashes  [ ]  ulcers CONSTITUTIONAL:  [ ]  fever   [ ]  chills  PHYSICAL EXAM: Filed Vitals:   07/22/13 0925  BP: 154/80  Pulse: 71  Height: 5\' 2"  (1.575 m)  Weight: 138 lb (62.596 kg)  SpO2: 100%   Body mass index is 25.23 kg/(m^2). GENERAL: The patient is a well-nourished female, in no acute distress. The vital signs are documented above. CARDIOVASCULAR: There is a regular rate and rhythm. She has palpable brachial and radial pulses bilaterally. PULMONARY: There is good air exchange bilaterally without wheezing or rales. ABDOMEN: Soft and non-tender with normal pitched bowel sounds.  MUSCULOSKELETAL: There are no major deformities or cyanosis. NEUROLOGIC: No focal weakness or paresthesias are detected. SKIN: There are no ulcers or rashes noted. PSYCHIATRIC: The patient has a normal affect.  DATA:  I have reviewed her vein mapping. The forearm cephalic vein on the right is fairly small. Basilic vein on the right appears reasonable.  On the left side, the basilic vein ranges from 2.14 cm-4.9 cm in the upper arm. The cephalic vein in the left arm ranges from 1.9 cm-3.8 cm.  MEDICAL ISSUES: This patient presents for new access. She appears to be a good candidate for a left upper arm brachiocephalic fistula. If this were not possible that possibly a basilic vein transposition. I have explained the indications for placement of an AV fistula or AV graft. I've explained that if at all possible  we will place an AV fistula.  I have reviewed the risks of placement of  an AV fistula including but not limited to: failure of the fistula to mature, need for subsequent interventions, and thrombosis. In addition I have reviewed the potential complications of placement of an AV graft. These risks include, but are not limited to, graft thrombosis, graft infection, wound healing problems, bleeding, arm swelling, and steal syndrome. All the patient's questions were answered and they are agreeable to proceed with surgery. Her surgery is scheduled for 07/24/2013.  Prophet Renwick S [facility] [place] Beeper: [phone]

## 2013-07-23 ENCOUNTER — Ambulatory Visit: Payer: Medicare Other

## 2013-07-23 ENCOUNTER — Ambulatory Visit: Payer: Medicare Other | Admitting: Radiation Oncology

## 2013-07-23 MED ORDER — DEXTROSE 5 % IV SOLN
1.5000 g | INTRAVENOUS | Status: AC
  Administered 2013-07-24: 1.5 g via INTRAVENOUS
  Filled 2013-07-23: qty 1.5

## 2013-07-23 MED ORDER — SODIUM CHLORIDE 0.9 % IV SOLN
INTRAVENOUS | Status: DC
  Administered 2013-07-24 (×2): via INTRAVENOUS

## 2013-07-24 ENCOUNTER — Encounter (HOSPITAL_COMMUNITY): Payer: Self-pay | Admitting: Anesthesiology

## 2013-07-24 ENCOUNTER — Ambulatory Visit (HOSPITAL_COMMUNITY)
Admission: RE | Admit: 2013-07-24 | Discharge: 2013-07-24 | Disposition: A | Discharge status: Home / Self Care | Payer: Medicare Other | Source: Ambulatory Visit | Attending: Vascular Surgery | Admitting: Vascular Surgery

## 2013-07-24 ENCOUNTER — Encounter (HOSPITAL_COMMUNITY): Payer: Self-pay | Admitting: *Deleted

## 2013-07-24 ENCOUNTER — Encounter (HOSPITAL_COMMUNITY)
Admission: RE | Disposition: A | Discharge status: Home / Self Care | Payer: Self-pay | Source: Ambulatory Visit | Attending: Vascular Surgery

## 2013-07-24 ENCOUNTER — Telehealth: Payer: Self-pay | Admitting: Vascular Surgery

## 2013-07-24 ENCOUNTER — Ambulatory Visit (HOSPITAL_COMMUNITY): Payer: Medicare Other | Admitting: Anesthesiology

## 2013-07-24 DIAGNOSIS — N186 End stage renal disease: Secondary | ICD-10-CM | POA: Insufficient documentation

## 2013-07-24 DIAGNOSIS — N185 Chronic kidney disease, stage 5: Secondary | ICD-10-CM

## 2013-07-24 DIAGNOSIS — E119 Type 2 diabetes mellitus without complications: Secondary | ICD-10-CM | POA: Insufficient documentation

## 2013-07-24 DIAGNOSIS — Z992 Dependence on renal dialysis: Secondary | ICD-10-CM | POA: Insufficient documentation

## 2013-07-24 DIAGNOSIS — I120 Hypertensive chronic kidney disease with stage 5 chronic kidney disease or end stage renal disease: Secondary | ICD-10-CM | POA: Insufficient documentation

## 2013-07-24 DIAGNOSIS — N182 Chronic kidney disease, stage 2 (mild): Secondary | ICD-10-CM

## 2013-07-24 HISTORY — PX: AV FISTULA PLACEMENT: SHX1204

## 2013-07-24 LAB — POCT I-STAT 4, (NA,K, GLUC, HGB,HCT)
HCT: 35 % — ABNORMAL LOW (ref 36.0–46.0)
Hemoglobin: 11.9 g/dL — ABNORMAL LOW (ref 12.0–15.0)
Sodium: 136 mEq/L (ref 135–145)

## 2013-07-24 LAB — GLUCOSE, CAPILLARY
Glucose-Capillary: 104 mg/dL — ABNORMAL HIGH (ref 70–99)
Glucose-Capillary: 100 mg/dL — ABNORMAL HIGH (ref 70–99)

## 2013-07-24 SURGERY — ARTERIOVENOUS (AV) FISTULA CREATION
Anesthesia: General | Site: Arm Lower | Laterality: Left | Wound class: Clean

## 2013-07-24 MED ORDER — 0.9 % SODIUM CHLORIDE (POUR BTL) OPTIME
TOPICAL | Status: DC | PRN
  Administered 2013-07-24: 1000 mL

## 2013-07-24 MED ORDER — ONDANSETRON HCL 4 MG/2ML IJ SOLN
INTRAMUSCULAR | Status: DC | PRN
  Administered 2013-07-24: 4 mg via INTRAVENOUS

## 2013-07-24 MED ORDER — OXYCODONE-ACETAMINOPHEN 5-325 MG PO TABS: 1.0000 | ORAL_TABLET | ORAL | Status: DC | PRN

## 2013-07-24 MED ORDER — PROMETHAZINE HCL 25 MG/ML IJ SOLN: 6.2500 mg | INTRAMUSCULAR | Status: DC | PRN

## 2013-07-24 MED ORDER — PROTAMINE SULFATE 10 MG/ML IV SOLN
INTRAVENOUS | Status: DC | PRN
  Administered 2013-07-24 (×3): 10 mg via INTRAVENOUS

## 2013-07-24 MED ORDER — FENTANYL CITRATE 0.05 MG/ML IJ SOLN
INTRAMUSCULAR | Status: DC | PRN
  Administered 2013-07-24: 50 ug via INTRAVENOUS
  Administered 2013-07-24: 25 ug via INTRAVENOUS
  Administered 2013-07-24: 75 ug via INTRAVENOUS

## 2013-07-24 MED ORDER — PHENYLEPHRINE HCL 10 MG/ML IJ SOLN
INTRAMUSCULAR | Status: DC | PRN
  Administered 2013-07-24: 80 ug via INTRAVENOUS

## 2013-07-24 MED ORDER — PROPOFOL 10 MG/ML IV BOLUS
INTRAVENOUS | Status: DC | PRN
  Administered 2013-07-24: 120 mg via INTRAVENOUS

## 2013-07-24 MED ORDER — LIDOCAINE HCL (CARDIAC) 10 MG/ML IV SOLN
INTRAVENOUS | Status: DC | PRN
  Administered 2013-07-24: 70 mg via INTRAVENOUS

## 2013-07-24 MED ORDER — HEPARIN SODIUM (PORCINE) 1000 UNIT/ML IJ SOLN
INTRAMUSCULAR | Status: DC | PRN
  Administered 2013-07-24: 5000 [IU] via INTRAVENOUS

## 2013-07-24 MED ORDER — MIDAZOLAM HCL 5 MG/5ML IJ SOLN
INTRAMUSCULAR | Status: DC | PRN
  Administered 2013-07-24: 1 mg via INTRAVENOUS

## 2013-07-24 MED ORDER — THROMBIN 20000 UNITS EX SOLR
CUTANEOUS | Status: AC
  Filled 2013-07-24: qty 20000

## 2013-07-24 MED ORDER — HYDROMORPHONE HCL PF 1 MG/ML IJ SOLN
0.2500 mg | INTRAMUSCULAR | Status: DC | PRN
  Administered 2013-07-24: 0.25 mg via INTRAVENOUS
  Administered 2013-07-24 (×2): 0.5 mg via INTRAVENOUS

## 2013-07-24 MED ORDER — LIDOCAINE HCL (PF) 1 % IJ SOLN
INTRAMUSCULAR | Status: AC
  Filled 2013-07-24: qty 30

## 2013-07-24 MED ORDER — SODIUM CHLORIDE 0.9 % IR SOLN
Status: DC | PRN
  Administered 2013-07-24: 07:00:00

## 2013-07-24 MED ORDER — HYDROMORPHONE HCL PF 1 MG/ML IJ SOLN
INTRAMUSCULAR | Status: AC
  Filled 2013-07-24: qty 1

## 2013-07-24 SURGICAL SUPPLY — 43 items
ADH SKN CLS APL DERMABOND .7 (GAUZE/BANDAGES/DRESSINGS) ×2
ARMBAND PINK RESTRICT EXTREMIT (MISCELLANEOUS) ×4 IMPLANT
CANISTER SUCTION 2500CC (MISCELLANEOUS) ×2 IMPLANT
CLIP TI MEDIUM 6 (CLIP) ×2 IMPLANT
CLIP TI WIDE RED SMALL 6 (CLIP) ×3 IMPLANT
COVER PROBE W GEL 5X96 (DRAPES) IMPLANT
COVER SURGICAL LIGHT HANDLE (MISCELLANEOUS) ×2 IMPLANT
DECANTER SPIKE VIAL GLASS SM (MISCELLANEOUS) ×2 IMPLANT
DERMABOND ADVANCED (GAUZE/BANDAGES/DRESSINGS) ×2
DERMABOND ADVANCED .7 DNX12 (GAUZE/BANDAGES/DRESSINGS) ×1 IMPLANT
DRAIN PENROSE 1/2X12 LTX STRL (WOUND CARE) IMPLANT
ELECT REM PT RETURN 9FT ADLT (ELECTROSURGICAL) ×2
ELECTRODE REM PT RTRN 9FT ADLT (ELECTROSURGICAL) ×1 IMPLANT
GLOVE BIO SURGEON STRL SZ7.5 (GLOVE) ×3 IMPLANT
GLOVE BIOGEL PI IND STRL 6.5 (GLOVE) IMPLANT
GLOVE BIOGEL PI IND STRL 7.0 (GLOVE) IMPLANT
GLOVE BIOGEL PI IND STRL 8 (GLOVE) ×1 IMPLANT
GLOVE BIOGEL PI INDICATOR 6.5 (GLOVE) ×1
GLOVE BIOGEL PI INDICATOR 7.0 (GLOVE) ×2
GLOVE BIOGEL PI INDICATOR 8 (GLOVE) ×1
GLOVE ECLIPSE 6.5 STRL STRAW (GLOVE) ×2 IMPLANT
GLOVE SS BIOGEL STRL SZ 6.5 (GLOVE) IMPLANT
GLOVE SUPERSENSE BIOGEL SZ 6.5 (GLOVE) ×1
GOWN STRL NON-REIN LRG LVL3 (GOWN DISPOSABLE) ×6 IMPLANT
KIT BASIN OR (CUSTOM PROCEDURE TRAY) ×2 IMPLANT
KIT ROOM TURNOVER OR (KITS) ×2 IMPLANT
NDL HYPO 25GX1X1/2 BEV (NEEDLE) ×1 IMPLANT
NEEDLE HYPO 25GX1X1/2 BEV (NEEDLE) ×2 IMPLANT
NS IRRIG 1000ML POUR BTL (IV SOLUTION) ×2 IMPLANT
PACK CV ACCESS (CUSTOM PROCEDURE TRAY) ×2 IMPLANT
PAD ARMBOARD 7.5X6 YLW CONV (MISCELLANEOUS) ×4 IMPLANT
SPONGE GAUZE 4X4 12PLY (GAUZE/BANDAGES/DRESSINGS) ×2 IMPLANT
SPONGE SURGIFOAM ABS GEL 100 (HEMOSTASIS) IMPLANT
SUT PROLENE 6 0 BV (SUTURE) ×2 IMPLANT
SUT SILK 3 0 (SUTURE) ×2
SUT SILK 3-0 18XBRD TIE 12 (SUTURE) IMPLANT
SUT VIC AB 3-0 SH 27 (SUTURE) ×4
SUT VIC AB 3-0 SH 27X BRD (SUTURE) ×1 IMPLANT
SUT VICRYL 4-0 PS2 18IN ABS (SUTURE) ×3 IMPLANT
TOWEL OR 17X24 6PK STRL BLUE (TOWEL DISPOSABLE) ×2 IMPLANT
TOWEL OR 17X26 10 PK STRL BLUE (TOWEL DISPOSABLE) ×2 IMPLANT
UNDERPAD 30X30 INCONTINENT (UNDERPADS AND DIAPERS) ×2 IMPLANT
WATER STERILE IRR 1000ML POUR (IV SOLUTION) ×2 IMPLANT

## 2013-07-24 NOTE — Transfer of Care (Signed)
Immediate Anesthesia Transfer of Care Note  Patient: [name]  Procedure(s) Performed: Procedure(s): ARTERIOVENOUS (AV) FISTULA CREATION- LEFT  (Left)  Patient Location: PACU  Anesthesia Type:General  Level of Consciousness: awake, alert , oriented and patient cooperative  Airway & Oxygen Therapy: Patient Spontanous Breathing and Patient connected to nasal cannula oxygen  Post-op Assessment: Report given to PACU RN, Post -op Vital signs reviewed and stable and Patient moving all extremities  Post vital signs: Reviewed and stable  Complications: No apparent anesthesia complications

## 2013-07-24 NOTE — Preoperative (Signed)
Beta Blockers   Reason not to administer Beta Blockers:Not Applicable 

## 2013-07-24 NOTE — Op Note (Signed)
NAME: [name]   MRN: [id number] DOB: Jan 12, 1941    DATE OF OPERATION: 07/24/2013  PREOP DIAGNOSIS: stage V chronic kidney disease  POSTOP DIAGNOSIS: same  PROCEDURE: left brachial cephalic AV fistula  SURGEON: Di Kindle. Edilia Bo, MD, FACS  ASSIST: Della Goo PA  ANESTHESIA: Gen.   EBL: minimal  INDICATIONS: [name] is a 72 y.o. female who presents for new access. She has a functioning tunneled dialysis catheter.  FINDINGS: the brachial artery took a 3 mm dilator. The upper arm cephalic vein was approximately 4 mm. I explored the forearm cephalic vein which was not adequate.  TECHNIQUE: The patient was brought to the operating room and received a general anesthetic. The left upper extremity was prepped and draped in usual sterile fashion.  An oblique incision was made at the wrist and here the cephalic vein was dissected free. It was very small and I did not think that it was adequate for placement of a right radiocephalic AV fistula. Therefore a separate transverse incision was made just above the antecubital level where the upper arm cephalic vein was dissected free. It was ligated distally. It irrigated up nicely with heparinized saline. The brachial artery was dissected free beneath the fascia. Patient was heparinized. The brachial artery was clamped proximally and distally and a longitudinal arteriotomy was made. The vein was mobilized over and sewn end-to-side to the artery using continuous 6-0 Prolene suture. At the completion was a good thrill in the fistula. By duplex, the vein was entrapped somewhat by fascia and therefore elected to free up the vein using 1 counterincision in the mid upper arm. I was able to divide the overlying fascia to help the vein distended nicely. Some small branches were ligated between 3-0 silk ties. Hemostasis was obtained and the incisions. The wounds were irrigated with saline. The heparin was partially reversed with protamine. Each  of the wound was closed with deep layer of 3-0 Vicryl the skin closed with 4-0 Vicryl. Dermabond was applied. The patient tolerated the procedure well and was transferred to the recovery room in stable condition. All needle and sponge counts were correct.  Waverly Ferrari, MD, FACS [facility]  DATE OF DICTATION:   07/24/2013

## 2013-07-24 NOTE — H&P (View-Only) (Signed)
[facility] [place]  Patient name: [name] MRN: [id number] DOB: 08/07/1941 Sex: female  REASON FOR VISIT: evaluate for hemodialysis access.  HPI: [name] is a 72 y.o. female who was recently in the hospital with a bowel obstruction. She has a left IJ tunneled dialysis catheter that was placed at the outpatient center. We were asked to evaluate her for permanent access. She is right-handed. She has recovered from her bowel obstruction. She does have some chronic fatigue. He's had anorexia but this is improving. She has some dyspnea on exertion.  Past Medical History  Diagnosis Date  . Hypertension   . Anginal pain   . Headache(784.0)   . Anxiety     h/o anxiety attacks  . Myocardial infarction 2011  . Arthritis     Left hip  . Anemia of renal disease 09/17/2012  . Bleeding   . Blood transfusion without reported diagnosis     hx of  . Acute renal failure   . Claustrophobia   . Panic attacks   . Cancer 11/2009, 04/2013    lymphoma, NHL B cell  . 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 Hx of radiation therapy 04/2013    right supraorbital region   Family History  Problem Relation Age of Onset  . Cancer Mother     throat  . Alzheimer's disease Sister   . Cancer Brother     lymphoma  . Cancer Sister     unknown type   SOCIAL HISTORY: History  Substance Use Topics  . Smoking status: Former Smoker -- 0.25 packs/day for 15 years    Quit date: 04/01/1964  . Smokeless tobacco: Never Used  . Alcohol Use: No   Allergies  Allergen Reactions  . Advicor [Niacin-Lovastatin Er]     Muscle cramps  . Crestor [Rosuvastatin]     Muscle cramps  . Lipitor [Atorvastatin]     Muscle cramps  . Lovastatin     Muscle cramps  . Morphine And Related Other (See Comments)    "made me crazy"  . Niaspan [Niacin Er]     Muscle cramps  . Nsaids     Gi side effects  . Pravachol [Pravastatin Sodium]     Gi side effects  .  Sulfa Antibiotics Other (See Comments)    Can't remember   . Vytorin [Ezetimibe-Simvastatin] Nausea Only  . Welchol [Colesevelam Hcl]     Muscle cramps  . Zetia [Ezetimibe]     Muscle cramps   . Zocor [Simvastatin]     Muscle cramps    Current Outpatient Prescriptions  Medication Sig Dispense Refill  . ALPRAZolam (XANAX) 0.5 MG tablet Take 0.5-1 mg by mouth 3 (three) times daily as needed for sleep or anxiety.      . cloNIDine (CATAPRES) 0.1 MG tablet Take 0.1 mg by mouth 2 (two) times daily.       . docusate sodium 100 MG CAPS Take 200 mg by mouth 3 (three) times daily as needed for constipation.  60 capsule  2  . esomeprazole (NEXIUM) 40 MG capsule Take 40 mg by mouth daily.        . lidocaine-prilocaine (EMLA) cream Apply 1 application topically as needed (for port-a-cath access).      . megestrol (MEGACE ES) 625 MG/5ML suspension Take 5 mLs (625 mg total) by mouth daily.  150 mL  4  . metoprolol (LOPRESSOR) 50 MG tablet Take 50 mg by mouth   2 (two) times daily.      . ondansetron (ZOFRAN-ODT) 8 MG disintegrating tablet Take 8 mg by mouth every 8 (eight) hours as needed for nausea.       . OVER THE COUNTER MEDICATION Take 5 mLs by mouth 2 (two) times daily at 10 AM and 5 PM. creamotion cough syrup      . polyethylene glycol powder (MIRALAX) powder Take 17 g by mouth daily.       . Polyvinyl Alcohol-Povidone (CLEAR EYES ALL SEASONS OP) Apply 1 drop to eye daily as needed. For itchy & dry eyes      . prochlorperazine (COMPAZINE) 10 MG tablet Take 10 mg by mouth every 8 (eight) hours as needed (for nausea).       . traMADol (ULTRAM) 50 MG tablet Take 50-100 mg by mouth every 6 (six) hours as needed for pain.        No current facility-administered medications for this visit.   REVIEW OF SYSTEMS: [X ] denotes positive finding; [  ] denotes negative finding  CARDIOVASCULAR:  [ ] chest pain   [ ] chest pressure   [ ] palpitations   [ ] orthopnea   [X ] dyspnea on exertion   [ ]  claudication   [ ] rest pain   [ ] DVT   [ ] phlebitis PULMONARY:   [ ] productive cough   [ ] asthma   [ ] wheezing NEUROLOGIC:   [X ] weakness  [ ] paresthesias  [ ] aphasia  [ ] amaurosis  [ ] dizziness HEMATOLOGIC:   [ ] bleeding problems   [ ] clotting disorders MUSCULOSKELETAL:  [ ] joint pain   [ ] joint swelling [ ] leg swelling GASTROINTESTINAL: [ ]  blood in stool  [ ]  hematemesis GENITOURINARY:  [ ]  dysuria  [ ]  hematuria PSYCHIATRIC:  [ ] history of major depression INTEGUMENTARY:  [ ] rashes  [ ] ulcers CONSTITUTIONAL:  [ ] fever   [ ] chills  PHYSICAL EXAM: Filed Vitals:   07/22/13 0925  BP: 154/80  Pulse: 71  Height: 5' 2" (1.575 m)  Weight: 138 lb (62.596 kg)  SpO2: 100%   Body mass index is 25.23 kg/(m^2). GENERAL: The patient is a well-nourished female, in no acute distress. The vital signs are documented above. CARDIOVASCULAR: There is a regular rate and rhythm. She has palpable brachial and radial pulses bilaterally. PULMONARY: There is good air exchange bilaterally without wheezing or rales. ABDOMEN: Soft and non-tender with normal pitched bowel sounds.  MUSCULOSKELETAL: There are no major deformities or cyanosis. NEUROLOGIC: No focal weakness or paresthesias are detected. SKIN: There are no ulcers or rashes noted. PSYCHIATRIC: The patient has a normal affect.  DATA:  I have reviewed her vein mapping. The forearm cephalic vein on the right is fairly small. Basilic vein on the right appears reasonable.  On the left side, the basilic vein ranges from 2.14 cm-4.9 cm in the upper arm. The cephalic vein in the left arm ranges from 1.9 cm-3.8 cm.  MEDICAL ISSUES: This patient presents for new access. She appears to be a good candidate for a left upper arm brachiocephalic fistula. If this were not possible that possibly a basilic vein transposition. I have explained the indications for placement of an AV fistula or AV graft. I've explained that if at all possible  we will place an AV fistula.  I have reviewed the risks of placement of   an AV fistula including but not limited to: failure of the fistula to mature, need for subsequent interventions, and thrombosis. In addition I have reviewed the potential complications of placement of an AV graft. These risks include, but are not limited to, graft thrombosis, graft infection, wound healing problems, bleeding, arm swelling, and steal syndrome. All the patient's questions were answered and they are agreeable to proceed with surgery. Her surgery is scheduled for 07/24/2013.  Daana Petrasek S [facility] [place] [facility]: [phone]    

## 2013-07-24 NOTE — Anesthesia Postprocedure Evaluation (Signed)
Anesthesia Post Note  Patient: [name]  Procedure(s) Performed: Procedure(s) (LRB): ARTERIOVENOUS (AV) FISTULA CREATION- LEFT  (Left)  Anesthesia type: general  Patient location: PACU  Post pain: Pain level controlled  Post assessment: Patient's Cardiovascular Status Stable  Last Vitals:  Filed Vitals:   07/24/13 1100  BP: 126/53  Pulse:   Temp: 37.1 C  Resp:     Post vital signs: Reviewed and stable  Level of consciousness: sedated  Complications: No apparent anesthesia complications

## 2013-07-24 NOTE — Anesthesia Preprocedure Evaluation (Addendum)
Anesthesia Evaluation  Patient identified by MRN, date of birth, ID band Patient awake    Reviewed: Allergy & Precautions, H&P , NPO status , Patient's Chart, lab work & pertinent test results  Airway Mallampati: II TM Distance: >3 FB Neck ROM: Full    Dental  (+) Edentulous Upper, Edentulous Lower and Dental Advisory Given   Pulmonary neg pulmonary ROS, former smoker,    Pulmonary exam normal       Cardiovascular hypertension, Pt. on medications + angina + Past MI + dysrhythmias Atrial Fibrillation     Neuro/Psych  Headaches, PSYCHIATRIC DISORDERS Anxiety negative neurological ROS     GI/Hepatic negative GI ROS, Neg liver ROS,   Endo/Other  diabetes, Type 1  Renal/GU CRF and ESRFRenal disease     Musculoskeletal   Abdominal   Peds  Hematology   Anesthesia Other Findings   Reproductive/Obstetrics                          Anesthesia Physical Anesthesia Plan  ASA: III  Anesthesia Plan: General   Post-op Pain Management:    Induction: Intravenous  Airway Management Planned: LMA  Additional Equipment:   Intra-op Plan:   Post-operative Plan: Extubation in OR  Informed Consent: I have reviewed the patients History and Physical, chart, labs and discussed the procedure including the risks, benefits and alternatives for the proposed anesthesia with the patient or authorized representative who has indicated his/her understanding and acceptance.   Dental advisory given  Plan Discussed with: CRNA, Anesthesiologist and Surgeon  Anesthesia Plan Comments:        Anesthesia Quick Evaluation

## 2013-07-24 NOTE — Interval H&P Note (Signed)
History and Physical Interval Note:  07/24/2013 7:34 AM  [name]  has presented today for surgery, with the diagnosis of End Stage Renal Diseae  The various methods of treatment have been discussed with the patient and family. After consideration of risks, benefits and other options for treatment, the patient has consented to  Procedure(s): ARTERIOVENOUS (AV) FISTULA CREATION- LEFT  (Left) as a surgical intervention .  The patient's history has been reviewed, patient examined, no change in status, stable for surgery.  I have reviewed the patient's chart and labs.  Questions were answered to the patient's satisfaction.     [name]

## 2013-07-24 NOTE — Telephone Encounter (Addendum)
Message copied by Fredrich Birks on Fri Jul 24, 2013 10:52 AM ------      Message from: Melene Plan      Created: Fri Jul 24, 2013 10:03 AM      Regarding: FW: charge and f/u                   ----- Message -----         From: Chuck Hint, MD         Sent: 07/24/2013   9:42 AM           To: Reuel Derby, Melene Plan, RN, #      Subject: charge and f/u                                           PROCEDURE: left brachial cephalic AV fistula            SURGEON: Di Kindle. Edilia Bo, MD, FACS            ASSIST: Della Goo PA            She will need a follow up visit in 6 weeks to check on the maturation of her fistula. She will need a duplex at that time. Thank you. CD ------  Left message for patient, dpm

## 2013-07-27 ENCOUNTER — Other Ambulatory Visit: Payer: Self-pay | Admitting: *Deleted

## 2013-07-27 ENCOUNTER — Ambulatory Visit: Payer: Medicare Other | Admitting: Radiation Oncology

## 2013-07-27 DIAGNOSIS — N186 End stage renal disease: Secondary | ICD-10-CM

## 2013-07-27 DIAGNOSIS — Z4931 Encounter for adequacy testing for hemodialysis: Secondary | ICD-10-CM

## 2013-07-28 ENCOUNTER — Encounter (HOSPITAL_COMMUNITY): Payer: Self-pay | Admitting: Vascular Surgery

## 2013-07-28 NOTE — Progress Notes (Addendum)
Lymphoma Location(s) / Histology:  Swelling over right eye - recurrent large cell non-Hodgkin lymphoma   Patient presented to Dr. Gustavo Lah office on 07/17/2013 months ago with symptoms of: swelling over her right eye.  Biopsies have not been done.  Past/Anticipated interventions by medical oncology, if any: none  Weight changes, if any, over the past 6 months: Patient says her weight goes up and down.  Recurrent fevers, or drenching night sweats, if any: no  SAFETY ISSUES:  Prior radiation? Yes - right parotid region - 35 gray 8/6-27/2014. Right supraorbital 36 gray 6/12-7/20/2014  Pacemaker/ICD? no  Possible current pregnancy? no  Is the patient on methotrexate? no  Current Complaints / other details:  [name] here in a wheelchair for consult.  She denies pain.  She is currently having dialysis on Tuesday, Thursday and Saturdays.  She has a swollen area over her right eye that appeared over a month ago.  It is not painful but is effecting her vision in her right eye.  She states that she does not have any energy and is having jerking movements in her arms and legs especially with activity.

## 2013-07-28 NOTE — Progress Notes (Signed)
CC:   [name], M.D. Mindi Slicker. Lowell Guitar, M.D.  DIAGNOSES: 1. Recurrent diffuse large cell non-Hodgkin lymphoma. 2. Renal failure, on dialysis. 3. 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  CURRENT THERAPY:  Observation.  INTERIM HISTORY:  [name] comes in for followup.  She now is on dialysis.  She has had 10 treatments so far.  She still feels very tired.  She has fatigue.  She has noticed a progressive swelling above her right eye again.  This was treated previously with radiation because of recurrence in that area.  She is having some difficulty with vision with the right eye.  She had radiation therapy to the right preauricular area.  This resolved the recurrence in that area.  She has had no cough.  Her appetite is down.  I am going to put her on some Megace.  Hopefully, insurance will cover this.  She is very nervous and anxious.  She is not sleeping well.  As such, I gave her a prescription for Xanax to take.  Hopefully, this will help her out.  Her overall performance status is ECOG 2-3.  She has had no nausea or vomiting.  She has had no diarrhea.  Her constipation has been a long-term problem for her.  She has had no rash.  Her skin is on the dry side.  PHYSICAL EXAMINATION:  General:  This is a well-developed, well- nourished white female in no obvious distress.  Vital signs: Temperature of 98.6, pulse 67, respiratory rate 14, blood pressure 160/69.  Weight is 131 pounds.  Head and neck:  Normocephalic, atraumatic skull.  There are no ocular or oral lesions.  She does have a swelling above the right eye along the supraorbital ridge.  This is slightly tender to palpation.  Her pupils react appropriately.  She has good extraocular muscle movement.  There are no oral lesions.  Neck: Supple with no adenopathy.  Lungs:  Clear bilaterally.  There may be some slight decrease at the bases.  Cardiac:  Regular rate and rhythm with a normal S1 and S2.  She does  have an occasional extra beat.  She has a 1/6 systolic ejection murmur.  Abdomen:  Soft.  She has good bowel sounds.  There is no fluid wave.  There is no palpable hepatosplenomegaly.  Extremities:  No clubbing, cyanosis or edema.  She has 4/5 strength in her legs.  She has decent joint mobility.  Skin: Shows some dry skin.  There are no rashes.  Neurologic:  No focal neurological deficits.  LABORATORY STUDIES:  White cell count is 5.1, hemoglobin 11.4, hematocrit 34.8, platelet count 88,000.  Sodium 137, potassium 4.3, BUN 11, creatinine 3.4.  Total protein 6.6 with an albumin of 3.4.  IMPRESSION:  [name] is a 72 year old white female with recurrent non-Hodgkin lymphoma.  Again, I suspect that she may have another recurrence.  This one is going to be very difficult to treat.  She has already had radiation to this area above the right eye.  I do not know if she can take any more radiation.  I will call to see if she can.  I just wish she would feel better.  I thought dialysis would make her feel better.  I have noted that her platelet count is down.  We are going to have to be very cautious with this.  When I looked at her blood smear, I did not see anything that was suspicious with her blood.  I just have  a sense that [name] is just going to decline gradually. I really thought that we could help her with the dialysis.  I want to see her back in another couple of weeks or so.    ______________________________ Josph Macho, M.D. PRE/MEDQ  D:  07/14/2013  T:  07/28/2013  Job:  [id number]

## 2013-07-29 ENCOUNTER — Ambulatory Visit
Admission: RE | Admit: 2013-07-29 | Discharge: 2013-07-29 | Disposition: A | Discharge status: Home / Self Care | Payer: Medicare Other | Source: Ambulatory Visit | Attending: Radiation Oncology | Admitting: Radiation Oncology

## 2013-07-29 ENCOUNTER — Other Ambulatory Visit: Payer: Self-pay | Admitting: Radiation Oncology

## 2013-07-29 ENCOUNTER — Encounter: Payer: Self-pay | Admitting: Radiation Oncology

## 2013-07-29 VITALS — BP 134/73 | HR 110 | Temp 98.9°F | Ht 62.0 in | Wt 133.3 lb

## 2013-07-29 DIAGNOSIS — Z923 Personal history of irradiation: Secondary | ICD-10-CM | POA: Insufficient documentation

## 2013-07-29 DIAGNOSIS — C8330 Diffuse large B-cell lymphoma, unspecified site: Secondary | ICD-10-CM

## 2013-07-29 DIAGNOSIS — C8589 Other specified types of non-Hodgkin lymphoma, extranodal and solid organ sites: Secondary | ICD-10-CM

## 2013-07-29 NOTE — Progress Notes (Signed)
Please see the Nurse Progress Note in the MD Initial Consult Encounter for this patient. 

## 2013-07-29 NOTE — Progress Notes (Signed)
Radiation Oncology         [phone] ________________________________  Name: [name] MRN: [id number]  Date: 07/29/2013  DOB: 1941-01-28  Reevaluation note  CC: Diana Espy, MD  Diana Macho, MD  Diagnosis:   Recurrent lymphoma  Interval Since Last Radiation:  One month for right parotid region treatment and 2 and half months for treatments to the right periorbital region.  Narrative:  [name] is seen at the courtesy of Dr. Myna [name] for consideration for additional radiation therapy. The patient approximately a month ago completed radiation treatments to the right parotid region for a mass in this area related to her lymphoma. She had a good response to this treatment as well as her prior treatments directed at the right periorbital region. Approximately 3-4 weeks ago the patient started noticing some swelling in the right eyebrow area. This progressed rapidly. Patient was seen by Dr. Myna [name] and a CT scan of the head was performed which showed a soft tissue swelling in the right supraorbital region which measured approximately 1.7 x 0.5 cm. In light of the clinical exam and the significant x-ray findings,  radiation therapy is been consulted for additional treatment.  This new mass is interfering with the patient's vision. She describes blurred vision but no double vision.  She denies any headaches.  She has started on dialysis.                            ALLERGIES:  is allergic to advicor; crestor; lipitor; lovastatin; morphine and related; niaspan; nsaids; pravachol; sulfa antibiotics; vytorin; welchol; zetia; and zocor.  Meds: Current Outpatient Prescriptions  Medication Sig Dispense Refill  . ALPRAZolam (XANAX) 0.5 MG tablet Take 0.5-1 mg by mouth 3 (three) times daily as needed for sleep or anxiety.      Marland Kitchen esomeprazole (NEXIUM) 40 MG capsule Take 40 mg by mouth daily.        Marland Kitchen lidocaine-prilocaine (EMLA) cream Apply 1 application topically as needed (for  port-a-cath access).      . megestrol (MEGACE ES) 625 MG/5ML suspension Take 5 mLs (625 mg total) by mouth daily.  150 mL  4  . ondansetron (ZOFRAN-ODT) 8 MG disintegrating tablet Take 8 mg by mouth every 8 (eight) hours as needed for nausea.       Marland Kitchen OVER THE COUNTER MEDICATION Take 5 mLs by mouth 2 (two) times daily at 10 AM and 5 PM. creamotion cough syrup      . oxyCODONE-acetaminophen (ROXICET) 5-325 MG per tablet Take 1-2 tablets by mouth every 4 (four) hours as needed for pain.  20 tablet  0  . Polyvinyl Alcohol-Povidone (CLEAR EYES ALL SEASONS OP) Apply 1 drop to eye daily as needed (for dry, itchy eyes). For itchy & dry eyes      . BAYER BREEZE 2 TEST DISK       . docusate sodium 100 MG CAPS Take 200 mg by mouth 3 (three) times daily as needed for constipation.  60 capsule  2  . polyethylene glycol powder (MIRALAX) powder Take 17 g by mouth daily.       . prochlorperazine (COMPAZINE) 10 MG tablet Take 10 mg by mouth every 8 (eight) hours as needed (for nausea).       . traMADol (ULTRAM) 50 MG tablet Take 50-100 mg by mouth every 6 (six) hours as needed for pain.        No current facility-administered medications for  this encounter.    Physical Findings: The patient is in no acute distress. Patient is alert and oriented.  height is 5\' 2"  (1.575 m) and weight is 133 lb 4.8 oz (60.464 kg). Her temperature is 98.9 F (37.2 C). Her blood pressure is 134/73 and her pulse is 110. Her oxygen saturation is 100%. .  In the right supraorbital region there is a hard palpable mass which appears fixed to the underlying bone.  this is estimated to be approximately 3 x 2 and half centimeters. This mass causing ptosis of the right eyelid and interferes with the patient's vision. Her extraocular eye movements are intact. The right parotid area shows no evidence of recurrence.  Lab Findings: Lab Results  Component Value Date   WBC 4.4 07/18/2013   HGB 11.9* 07/24/2013   HCT 35.0* 07/24/2013   MCV 105.1*  07/18/2013   PLT 113* 07/18/2013    @LASTCHEM @  Radiographic Findings: Ct Abdomen Pelvis Wo Contrast  07/16/2013   CLINICAL DATA:  Elevated lipase. Diffuse abdominal pain.  EXAM: CT ABDOMEN AND PELVIS WITHOUT CONTRAST  TECHNIQUE: Multidetector CT imaging of the abdomen and pelvis was performed following the standard protocol without intravenous contrast.  COMPARISON:  The 05/28/2013 PET-CT.  FINDINGS: BODY WALL: Unremarkable.  LOWER CHEST:  Mediastinum: Extensive coronary artery atherosclerosis.  Lungs/pleura: No consolidation.  ABDOMEN/PELVIS:  Liver: Numerous calcifications, probable remote granulomatous  Biliary: Layering gallstones. No acute cholecystitis.  Pancreas: Unremarkable.  Spleen: Thick walled subcapsular collection in the lateral spleen, 3.5 cm, compatible with pseudocyst from remote insult.  Adrenals: Thickening and coarse calcifications in the left adrenal gland, stable.  Kidneys and ureters: No hydronephrosis or stone.  Bladder: Unremarkable.  Bowel: Dilated small bowel with fluid levels, leading to the pelvis were there is abrupt caliber change at the level of the vaginal cuff. No mass seen in this region on recent PET-CT 05/28/2013. The dilated distal small bowel has mild mesenteric edematous changes. No pneumatosis or perforation. Status post appendectomy. 20 x 8 mm lipoma in the cecum.  Retroperitoneum: No mass or adenopathy.  Peritoneum: No pneumoperitoneum.  Reproductive: Hysterectomy.  Vascular: No acute abnormality.  OSSEOUS: Marked diffuse demineralization. No acute abnormality.  IMPRESSION: 1. High-grade mid small bowel obstruction with transition point in the low pelvis, near the vaginal cuff. 2. Cholelithiasis.   Electronically Signed   By: Tiburcio Pea   On: 07/16/2013 04:39   Ct Head Wo Contrast  07/22/2013   CLINICAL DATA:  Recurrent right supraorbital mass. Non Hodgkin lymphoma.  EXAM: CT HEAD WITHOUT CONTRAST  TECHNIQUE: Contiguous axial images were obtained from the  base of the skull through the vertex without intravenous contrast.  COMPARISON:  CT head 07/18/2013  FINDINGS: Soft tissue mass right upper eyelid has progressed and now measures 5 x 20 mm. Dedicated imaging of the orbits not performed today.  Generalized atrophy. Chronic microvascular ischemic changes are stable. Negative for intracranial mass or edema. No acute infarct or hemorrhage. Negative for skull lesion.  IMPRESSION: Soft tissue swelling of the right dilated has progressed over the last 4 days suggesting rapid growth of tumor.  No acute intracranial abnormality.   Electronically Signed   By: Marlan Palau M.D.   On: 07/22/2013 13:08   Ct Head Wo Contrast  07/18/2013   *RADIOLOGY REPORT*  Clinical Data: Recurrent swelling about right eye, question of recurrent non-Hodgkin's lymphoma.  CT HEAD WITHOUT CONTRAST  Technique:  Contiguous axial images were obtained from the base of the skull through the  vertex without contrast.  Comparison: Prior CT from 05/22/2013  Findings: There is subtle asymmetric soft tissue swelling within the right forehead just above the right eye, which measures approximately 1.7 x 0.5 cm (series 2, image 6). This finding is new as compared to the prior CT from 05/22/2013. This soft tissue abuts the outer table of the supraorbital rim. The underlying osseous cortex is intact with normal appearance.  The right orbit and right lacrimal gland are normal and symmetric with the contralateral left globe and lacrimal gland.  No intracranial extension is identified.  In the brain, mild diffuse cortical atrophy with chronic microvascular ischemic changes are again seen, not significantly changed.  No acute ischemic hemorrhage or infarct identified.  No midline shift or mass lesion.  No extra-axial fluid collection. Gray-white matter differentiation is well maintained.  Mastoid air cells and paranasal sinuses are clear.  IMPRESSION:  1.  Subtle asymmetric soft tissue swelling within the right  supraorbital region as above.  This finding is of uncertain clinical significance, and further evaluation with MRI may be helpful for further characterization.  In addition, histopathologic correlation could be performed to evaluate for possible recurrent non-Hodgkins lymphoma.  2.  Stable appearance of age-related atrophy and chronic microvascular ischemic disease.  No acute intracranial process.   Original Report Authenticated By: Rise Mu, M.D.   Dg Abd Portable 2v  07/17/2013   CLINICAL DATA:  Small bowel obstruction.  EXAM: PORTABLE ABDOMEN - 2 VIEW  COMPARISON:  CT of the abdomen and pelvis on 07/16/2013.  FINDINGS: A nasogastric tube has been placed and extends into the stomach. There is complete resolution of small bowel dilatation with no radiographic evidence obstruction identified currently. Ingested oral contrast at the time of CT is now identified in the colon which is decompressed. No free air is identified.  IMPRESSION: Complete resolution of small bowel obstruction. Oral contrast transit noted into the colon.   Electronically Signed   By: Irish Lack   On: 07/17/2013 07:54    Impression:  Recurrent lymphoma. The patient has recurred in her previous area of treatment which completed approximately 2 and half months ago. Patient is not a candidate for additional chemotherapy and surgery would not be an option for management. She would be a candidate for retreatment with radiation therapy. The patient understands the risk for cataract formation, blindness, dry eye and potential skull damage. Given the limited other options the patient is willing to accept this risk.  Plan:  Simulation and planning tomorrow. The patient will start early next week as this tumor seems to be growing rapidly compared to her CT scan only 10 days ago.  _____________________________________ -----------------------------------  Billie Lade, PhD, MD

## 2013-07-30 ENCOUNTER — Ambulatory Visit
Admission: RE | Admit: 2013-07-30 | Discharge: 2013-07-30 | Disposition: A | Discharge status: Home / Self Care | Payer: Medicare Other | Source: Ambulatory Visit | Attending: Radiation Oncology | Admitting: Radiation Oncology

## 2013-07-30 DIAGNOSIS — C8589 Other specified types of non-Hodgkin lymphoma, extranodal and solid organ sites: Secondary | ICD-10-CM

## 2013-07-30 DIAGNOSIS — Z79899 Other long term (current) drug therapy: Secondary | ICD-10-CM | POA: Insufficient documentation

## 2013-07-30 DIAGNOSIS — Z510 Encounter for antineoplastic radiation therapy: Secondary | ICD-10-CM | POA: Insufficient documentation

## 2013-07-30 DIAGNOSIS — R61 Generalized hyperhidrosis: Secondary | ICD-10-CM | POA: Insufficient documentation

## 2013-07-30 NOTE — Progress Notes (Signed)
  Radiation Oncology         [phone] ________________________________  Name: MERCEDE ROLLO MRN: [id number]  Date: 07/30/2013  DOB: Dec 02, 1940  SIMULATION AND TREATMENT PLANNING NOTE  DIAGNOSIS:  Recurrent lymphoma  NARRATIVE:  The patient was brought to the CT Simulation planning suite.  Identity was confirmed.  All relevant records and images related to the planned course of therapy were reviewed.  The patient freely provided informed written consent to proceed with treatment after reviewing the details related to the planned course of therapy. The consent form was witnessed and verified by the simulation staff.  Then, the patient was set-up in a stable reproducible  supine position for radiation therapy.  CT images were obtained.  Surface markings were placed.  The CT images were loaded into the planning software.  Then the target and avoidance structures were contoured.  Treatment planning then occurred.  The radiation prescription was entered and confirmed.  Then, I designed and supervised the construction of a total of 1 medically necessary complex treatment devices.  I have requested : Intensity Modulated Radiotherapy (IMRT) is medically necessary for this case for the following reason:  Previous treatment to this area..  I have ordered:dose calc.  PLAN:  The patient will receive 25 Gy in 10 fractions.  ________________________________  -----------------------------------  Billie Lade, PhD, MD

## 2013-08-04 ENCOUNTER — Ambulatory Visit (HOSPITAL_BASED_OUTPATIENT_CLINIC_OR_DEPARTMENT_OTHER): Payer: Medicare Other | Admitting: Hematology & Oncology

## 2013-08-04 ENCOUNTER — Ambulatory Visit (HOSPITAL_BASED_OUTPATIENT_CLINIC_OR_DEPARTMENT_OTHER): Payer: Medicare Other | Admitting: Lab

## 2013-08-04 VITALS — BP 162/75 | HR 92 | Temp 97.2°F | Resp 16 | Ht 62.0 in | Wt 133.0 lb

## 2013-08-04 DIAGNOSIS — D509 Iron deficiency anemia, unspecified: Secondary | ICD-10-CM

## 2013-08-04 DIAGNOSIS — N186 End stage renal disease: Secondary | ICD-10-CM

## 2013-08-04 DIAGNOSIS — R64 Cachexia: Secondary | ICD-10-CM

## 2013-08-04 DIAGNOSIS — D631 Anemia in chronic kidney disease: Secondary | ICD-10-CM

## 2013-08-04 DIAGNOSIS — F410 Panic disorder [episodic paroxysmal anxiety] without agoraphobia: Secondary | ICD-10-CM

## 2013-08-04 DIAGNOSIS — C8589 Other specified types of non-Hodgkin lymphoma, extranodal and solid organ sites: Secondary | ICD-10-CM

## 2013-08-04 DIAGNOSIS — C8330 Diffuse large B-cell lymphoma, unspecified site: Secondary | ICD-10-CM

## 2013-08-04 DIAGNOSIS — Z992 Dependence on renal dialysis: Secondary | ICD-10-CM

## 2013-08-04 LAB — CMP (CANCER CENTER ONLY)
AST: 21 U/L (ref 11–38)
Albumin: 3.1 g/dL — ABNORMAL LOW (ref 3.3–5.5)
Alkaline Phosphatase: 56 U/L (ref 26–84)
BUN, Bld: 37 mg/dL — ABNORMAL HIGH (ref 7–22)
Potassium: 4.9 mEq/L — ABNORMAL HIGH (ref 3.3–4.7)
Sodium: 134 mEq/L (ref 128–145)
Total Protein: 6.4 g/dL (ref 6.4–8.1)

## 2013-08-04 LAB — CBC WITH DIFFERENTIAL (CANCER CENTER ONLY)
BASO#: 0.1 10*3/uL (ref 0.0–0.2)
BASO%: 0.8 % (ref 0.0–2.0)
EOS%: 10.5 % — ABNORMAL HIGH (ref 0.0–7.0)
HCT: 30.9 % — ABNORMAL LOW (ref 34.8–46.6)
HGB: 10 g/dL — ABNORMAL LOW (ref 11.6–15.9)
LYMPH#: 2.2 10*3/uL (ref 0.9–3.3)
LYMPH%: 33.6 % (ref 14.0–48.0)
MCHC: 32.4 g/dL (ref 32.0–36.0)
MCV: 106 fL — ABNORMAL HIGH (ref 81–101)
MONO#: 0.9 10*3/uL (ref 0.1–0.9)
NEUT#: 2.7 10*3/uL (ref 1.5–6.5)
NEUT%: 40.8 % (ref 39.6–80.0)
WBC: 6.5 10*3/uL (ref 3.9–10.0)

## 2013-08-04 NOTE — Progress Notes (Signed)
     [facility] at [facility] [address] [place], [place].  [address] [phone] (fax)    Your Providers PCP: [name], MD,  [phone]) Referring Provider: [name], MD,  [phone])    DIAGNOSES:  Problem List Items Addressed This Visit     Oncology   Large cell (diffuse) non-Hodgkin's lymphoma - Primary     Other   Anemia of renal disease (Chronic)   ESRD on hemodialysis       CURRENT THERAPY: Patient to start radiation therapy to the right orbital ridge recurrence. #2 dialysis for chronic renal failure.  INTERIM HISTORY:   PHYSICAL EXAMINATION:   General: see note Vital signs:   Filed Vitals:   08/04/13 0936  BP: 162/75  Pulse: 92  Temp: 97.2 F (36.2 C)  TempSrc: Oral  Resp: 16  Height: 5\' 2"  (1.575 m)  Weight: 133 lb (60.328 kg)    Head and neck: see note Lungs: see note Cardiac: see note.  Abdomen: seenote  Extremities: see note Skin: see note Neurological: see note  LABORATORY STUDIES: CBC    Component Value Date/Time   WBC 6.5 08/04/2013 0917   WBC 4.4 07/18/2013 1450   RBC 2.54* 07/18/2013 1450   RBC 2.91* 05/22/2013 1203   HGB 10.0* 08/04/2013 0917   HGB 11.9* 07/24/2013 0556   HCT 30.9* 08/04/2013 0917   HCT 35.0* 07/24/2013 0556   PLT 181 08/04/2013 0917   PLT 113* 07/18/2013 1450   MCV 106* 08/04/2013 0917   MCV 105.1* 07/18/2013 1450   MCH 34.4* 08/04/2013 0917   MCH 34.6* 07/18/2013 1450   MCHC 32.4 08/04/2013 0917   MCHC 33.0 07/18/2013 1450   RDW 15.2 08/04/2013 0917   RDW 15.7* 07/18/2013 1450   LYMPHSABS 2.2 08/04/2013 0917   LYMPHSABS 3.4 07/16/2013 0130   MONOABS 0.8 07/16/2013 0130   EOSABS 0.7* 08/04/2013 0917   EOSABS 0.1 07/16/2013 0130   BASOSABS 0.1 08/04/2013 0917   BASOSABS 0.0 07/16/2013 0130     IMPRESSION: [name] is a 72 y.o. @GENDER @ with a history of NHL.  PLAN: We will plan to get her back to see [place] in another month. If there is any problem in  between visits, she can certainly come back and see [place].   ______________________________  Josph Macho, M.D.  08/04/2013 10:11 AM

## 2013-08-05 ENCOUNTER — Ambulatory Visit
Admission: RE | Admit: 2013-08-05 | Discharge: 2013-08-05 | Disposition: A | Discharge status: Home / Self Care | Payer: Medicare Other | Source: Ambulatory Visit | Attending: Radiation Oncology | Admitting: Radiation Oncology

## 2013-08-05 DIAGNOSIS — C8589 Other specified types of non-Hodgkin lymphoma, extranodal and solid organ sites: Secondary | ICD-10-CM

## 2013-08-05 LAB — IRON AND TIBC
%SAT: 27 % (ref 20–55)
UIBC: 131 ug/dL (ref 125–400)

## 2013-08-05 LAB — FERRITIN: Ferritin: 1893 ng/mL — ABNORMAL HIGH (ref 10–291)

## 2013-08-05 NOTE — Progress Notes (Signed)
  Radiation Oncology         [phone] ________________________________  Name: [name] MRN: [id number]  Date: 08/05/2013  DOB: [birth year]  Simulation Verification Note  Status: outpatient  NARRATIVE: The patient was brought to the treatment unit and placed in the planned treatment position. The clinical setup was verified. Then port films were obtained and uploaded to the radiation oncology medical record software.  The treatment beams were carefully compared against the planned radiation fields. The position location and shape of the radiation fields was reviewed. They targeted volume of tissue appears to be appropriately covered by the radiation beams. Organs at risk appear to be excluded as planned.  Based on my personal review, I approved the simulation verification. The patient's treatment will proceed as planned.  -----------------------------------  Billie Lade, PhD, MD

## 2013-08-05 NOTE — Progress Notes (Signed)
   Department of Radiation Oncology  Phone:  [phone] Fax:        [phone]  Simple treatment device note   Today the patient had developed a 1/2 cm custom bolus. This will be placed over the right forehead and face area to ensure adequate dose to the superficial aspects of the treatment area.  -----------------------------------  Billie Lade, PhD, MD

## 2013-08-05 NOTE — Addendum Note (Signed)
Encounter addended by: Billie Lade, MD on: 08/05/2013  7:30 PM<BR>     Documentation filed: Notes Section

## 2013-08-06 ENCOUNTER — Ambulatory Visit
Admission: RE | Admit: 2013-08-06 | Discharge: 2013-08-06 | Disposition: A | Discharge status: Home / Self Care | Payer: Medicare Other | Source: Ambulatory Visit | Attending: Radiation Oncology | Admitting: Radiation Oncology

## 2013-08-07 ENCOUNTER — Ambulatory Visit
Admission: RE | Admit: 2013-08-07 | Discharge: 2013-08-07 | Disposition: A | Discharge status: Home / Self Care | Payer: Medicare Other | Source: Ambulatory Visit | Attending: Radiation Oncology | Admitting: Radiation Oncology

## 2013-08-07 LAB — RETICULOCYTES (CHCC)
ABS Retic: 59.4 10*3/uL (ref 19.0–186.0)
RBC.: 2.97 MIL/uL — ABNORMAL LOW (ref 3.87–5.11)
Retic Ct Pct: 2 % (ref 0.4–2.3)

## 2013-08-07 LAB — LACTATE DEHYDROGENASE: LDH: 276 U/L — ABNORMAL HIGH (ref 94–250)

## 2013-08-10 ENCOUNTER — Ambulatory Visit
Admission: RE | Admit: 2013-08-10 | Discharge: 2013-08-10 | Disposition: A | Discharge status: Home / Self Care | Payer: Medicare Other | Source: Ambulatory Visit | Attending: Radiation Oncology | Admitting: Radiation Oncology

## 2013-08-11 ENCOUNTER — Ambulatory Visit
Admission: RE | Admit: 2013-08-11 | Discharge: 2013-08-11 | Disposition: A | Discharge status: Home / Self Care | Payer: Medicare Other | Source: Ambulatory Visit | Attending: Radiation Oncology | Admitting: Radiation Oncology

## 2013-08-11 ENCOUNTER — Encounter: Payer: Self-pay | Admitting: Radiation Oncology

## 2013-08-11 VITALS — BP 159/82 | HR 89 | Temp 98.3°F | Resp 20 | Wt 136.0 lb

## 2013-08-11 DIAGNOSIS — C8589 Other specified types of non-Hodgkin lymphoma, extranodal and solid organ sites: Secondary | ICD-10-CM

## 2013-08-11 MED ORDER — OXYCODONE-ACETAMINOPHEN 5-325 MG PO TABS: 1.0000 | ORAL_TABLET | ORAL | Status: DC | PRN

## 2013-08-11 NOTE — Progress Notes (Addendum)
Weekly rad tx right orbita, 5/10 completed, slight swelling over eye,"swelling has gone down a lot,I can see out my eye" no c/o pain, at present, is having night sweats stated nightly, using baby oil before treatment between eye brows, need refill on Roxicet 5/325mg , ran out, not taking  tramadol, nausea in the mornings, "Not too bad today", only 2 swallows coffe ,no breakfast yet,will eat when she gets home, in w/c, very weak stated 9:04 AM, "im eating better ,has gained  3 lb weight with the megace since last week, ate home made lasagna last night,

## 2013-08-11 NOTE — Progress Notes (Signed)
[facility]    Radiation Oncology [address] [place]     Maryln Gottron, M.D. [place], [place] [address]               Billie Lade, M.D., Ph.D. Phone: [phone]      Molli Hazard A. Kathrynn Running, M.D. Fax: [phone]      Radene Gunning, M.D., Ph.D.         Lurline Hare, M.D.         Grayland Jack, M.D Weekly Treatment Management Note  Name: [name]     MRN: [id number]        CSN: [id number] Date: 08/11/2013      DOB: 07/16/1941  CC: Diana Espy, MD         Kevan Ny    Status: Outpatient  Diagnosis: The encounter diagnosis was Other malignant lymphomas, unspecified site, extranodal and solid organ sites.  Current Dose: 12.5 Gy  Current Fraction: 5 of 10  Planned Dose: 25 Gy  Narrative: [name] was seen today for weekly treatment management. The chart was checked and CBCT  were reviewed. She is tolerating the treatments well at this time. She has actually noticed tumor shrinkage with improvement in her vision.  She has noticed some soreness along the right jaw area. She is also noticed night sweats which may be a worrisome sign.Rolm Bookbinder; Crestor; Lipitor; Lovastatin; Morphine and related; Niaspan; Nsaids; Pravachol; Sulfa antibiotics; Vytorin; Welchol; Zetia; and Zocor  Current Outpatient Prescriptions  Medication Sig Dispense Refill  . ALPRAZolam (XANAX) 0.5 MG tablet Take 0.5-1 mg by mouth 3 (three) times daily as needed for sleep or anxiety.      Marland Kitchen esomeprazole (NEXIUM) 40 MG capsule Take 40 mg by mouth daily.        Marland Kitchen lidocaine-prilocaine (EMLA) cream Apply 1 application topically as needed (for port-a-cath access).      . megestrol (MEGACE ES) 625 MG/5ML suspension Take 5 mLs (625 mg total) by mouth daily.  150 mL  4  . OVER THE COUNTER MEDICATION Take 5 mLs by mouth 2 (two) times daily at 10 AM and 5 PM. creamotion cough syrup      . oxyCODONE-acetaminophen (ROXICET) 5-325 MG per tablet Take 1-2 tablets by mouth every 4 (four) hours  as needed for pain.  50 tablet  0  . Polyvinyl Alcohol-Povidone (CLEAR EYES ALL SEASONS OP) Apply 1 drop to eye daily as needed (for dry, itchy eyes). For itchy & dry eyes      . docusate sodium 100 MG CAPS Take 200 mg by mouth 3 (three) times daily as needed for constipation.  60 capsule  2  . ondansetron (ZOFRAN-ODT) 8 MG disintegrating tablet Take 8 mg by mouth every 8 (eight) hours as needed for nausea.       . polyethylene glycol powder (MIRALAX) powder Take 17 g by mouth daily.       . traMADol (ULTRAM) 50 MG tablet Take 50-100 mg by mouth every 6 (six) hours as needed for pain.        No current facility-administered medications for this encounter.   Labs:  Lab Results  Component Value Date   WBC 6.5 08/04/2013   HGB 10.0* 08/04/2013   HCT 30.9* 08/04/2013   MCV 106* 08/04/2013   PLT 181 08/04/2013   Lab Results  Component Value Date   CREATININE 6.7* 08/04/2013   BUN 37* 08/04/2013   NA 134 08/04/2013   K 4.9* 08/04/2013  CL 87* 08/04/2013   CO2 28 08/04/2013   Lab Results  Component Value Date   ALT 14 08/04/2013   AST 21 08/04/2013   PHOS 3.5 07/18/2013   BILITOT 0.40 08/04/2013    Physical Examination:  weight is 136 lb (61.689 kg). Her oral temperature is 98.3 F (36.8 C). Her blood pressure is 159/82 and her pulse is 89. Her respiration is 20.    Wt Readings from Last 3 Encounters:  08/11/13 136 lb (61.689 kg)  08/04/13 133 lb (60.328 kg)  07/24/13 130 lb 4.7 oz (59.1 kg)   The right supraorbital mass has decreased in size and softer on exam.  There is no palpable mass along the right preauricular area at this time.  she has some tenderness with palpation along the area of the right upper mandible  Assessment:  Patient tolerating treatments well  Plan: Continue treatment per original radiation prescription

## 2013-08-12 ENCOUNTER — Other Ambulatory Visit: Payer: Self-pay | Admitting: *Deleted

## 2013-08-12 ENCOUNTER — Ambulatory Visit
Admission: RE | Admit: 2013-08-12 | Discharge: 2013-08-12 | Disposition: A | Discharge status: Home / Self Care | Payer: Medicare Other | Source: Ambulatory Visit | Attending: Radiation Oncology | Admitting: Radiation Oncology

## 2013-08-12 MED ORDER — ONDANSETRON 8 MG PO TBDP: 8.0000 mg | ORAL_TABLET | Freq: Three times a day (TID) | ORAL | Status: DC | PRN

## 2013-08-13 ENCOUNTER — Ambulatory Visit: Payer: Medicare Other

## 2013-08-14 ENCOUNTER — Ambulatory Visit
Admission: RE | Admit: 2013-08-14 | Discharge: 2013-08-14 | Disposition: A | Discharge status: Home / Self Care | Payer: Medicare Other | Source: Ambulatory Visit | Attending: Radiation Oncology | Admitting: Radiation Oncology

## 2013-08-17 ENCOUNTER — Ambulatory Visit
Admission: RE | Admit: 2013-08-17 | Discharge: 2013-08-17 | Disposition: A | Discharge status: Home / Self Care | Payer: Medicare Other | Source: Ambulatory Visit | Attending: Radiation Oncology | Admitting: Radiation Oncology

## 2013-08-18 ENCOUNTER — Ambulatory Visit: Payer: Medicare Other

## 2013-08-18 ENCOUNTER — Ambulatory Visit
Admission: RE | Admit: 2013-08-18 | Discharge: 2013-08-18 | Disposition: A | Discharge status: Home / Self Care | Payer: Medicare Other | Source: Ambulatory Visit | Attending: Radiation Oncology | Admitting: Radiation Oncology

## 2013-08-18 VITALS — BP 147/58 | HR 74 | Temp 97.9°F | Ht 62.0 in | Wt 140.8 lb

## 2013-08-18 DIAGNOSIS — C8589 Other specified types of non-Hodgkin lymphoma, extranodal and solid organ sites: Secondary | ICD-10-CM

## 2013-08-18 NOTE — Progress Notes (Signed)
[name] here for weekly under treat visit.  She has had 9 fractions to her right orbit.  She is having pain in her left arm that she says can get to an 8/10 from where she has a fistula for dialysis.  She takes a oxycodone/acetaminiophen which helps with the pain.  The swelling in her right eye has improved and she reports that she is able to see now.  She says her right eye is itchy.  She is using the clear eye drops with help.  The skin is intact above her right eye.

## 2013-08-18 NOTE — Progress Notes (Signed)
[facility]    Radiation Oncology [address] [place]     Maryln Gottron, M.D. [place], [place] [address]               Billie Lade, M.D., Ph.D. Phone: [phone]      Molli Hazard A. Kathrynn Running, M.D. Fax: [phone]      Radene Gunning, M.D., Ph.D.         Lurline Hare, M.D.         Grayland Jack, M.D Weekly Treatment Management Note  Name: [name]     MRN: [id number]        CSN: [id number] Date: 08/18/2013      DOB: [birth year]  CC: Diana Espy, MD         Kevan Ny    Status: Outpatient  Diagnosis: The encounter diagnosis was Other malignant lymphomas, unspecified site, extranodal and solid organ sites.  Current Dose: 22.5 Gy  Current Fraction: 9  Planned Dose: 25 Gy  Narrative: [name] was seen today for weekly treatment management. The chart was checked and CBCT  were reviewed. She is tolerating her treatments well. She has noticed continued improvement in her swelling and vision out of her right eye. She does have some itching along both eyes but with Claritin this is been helped. Also recommend she try  Sensitive eyes  Saline solution in addition.  Advicor; Crestor; Lipitor; Lovastatin; Morphine and related; Niaspan; Nsaids; Pravachol; Sulfa antibiotics; Vytorin; Welchol; Zetia; and Zocor Current Outpatient Prescriptions  Medication Sig Dispense Refill  . ALPRAZolam (XANAX) 0.5 MG tablet Take 0.5-1 mg by mouth 3 (three) times daily as needed for sleep or anxiety.      Marland Kitchen esomeprazole (NEXIUM) 40 MG capsule Take 40 mg by mouth daily.        Marland Kitchen lidocaine-prilocaine (EMLA) cream Apply 1 application topically as needed (for port-a-cath access).      Marland Kitchen loratadine (CLARITIN) 10 MG tablet Take 10 mg by mouth daily.      . ondansetron (ZOFRAN-ODT) 8 MG disintegrating tablet Take 1 tablet (8 mg total) by mouth every 8 (eight) hours as needed for nausea.  20 tablet  1  . OVER THE COUNTER MEDICATION Take 5 mLs by mouth 2 (two) times daily at 10 AM  and 5 PM. creamotion cough syrup      . oxyCODONE-acetaminophen (ROXICET) 5-325 MG per tablet Take 1-2 tablets by mouth every 4 (four) hours as needed for pain.  50 tablet  0  . Polyvinyl Alcohol-Povidone (CLEAR EYES ALL SEASONS OP) Apply 1 drop to eye daily as needed (for dry, itchy eyes). For itchy & dry eyes      . traMADol (ULTRAM) 50 MG tablet Take 50-100 mg by mouth every 6 (six) hours as needed for pain.       Marland Kitchen docusate sodium 100 MG CAPS Take 200 mg by mouth 3 (three) times daily as needed for constipation.  60 capsule  2  . megestrol (MEGACE ES) 625 MG/5ML suspension Take 5 mLs (625 mg total) by mouth daily.  150 mL  4  . polyethylene glycol powder (MIRALAX) powder Take 17 g by mouth daily.        No current facility-administered medications for this encounter.   Labs:  Lab Results  Component Value Date   WBC 6.5 08/04/2013   HGB 10.0* 08/04/2013   HCT 30.9* 08/04/2013   MCV 106* 08/04/2013   PLT 181 08/04/2013   Lab Results  Component  Value Date   CREATININE 6.7* 08/04/2013   BUN 37* 08/04/2013   NA 134 08/04/2013   K 4.9* 08/04/2013   CL 87* 08/04/2013   CO2 28 08/04/2013   Lab Results  Component Value Date   ALT 14 08/04/2013   AST 21 08/04/2013   PHOS 3.5 07/18/2013   BILITOT 0.40 08/04/2013    Physical Examination:  height is 5\' 2"  (1.575 m) and weight is 140 lb 12.8 oz (63.866 kg). Her temperature is 97.9 F (36.6 C). Her blood pressure is 147/58 and her pulse is 74.    Wt Readings from Last 3 Encounters:  08/18/13 140 lb 12.8 oz (63.866 kg)  08/11/13 136 lb (61.689 kg)  08/04/13 133 lb (60.328 kg)    The right supraorbital mass has flattened out nicely. Minimal skin reaction is noted. Lungs - Normal respiratory effort, chest expands symmetrically. Lungs are clear to auscultation, no crackles or wheezes.  Heart has regular rhythm and rate  Abdomen is soft and non tender with normal bowel sounds  Assessment:  Patient tolerating treatments well  Plan: Continue  treatment per original radiation prescription

## 2013-08-19 ENCOUNTER — Ambulatory Visit: Payer: Medicare Other

## 2013-08-19 ENCOUNTER — Ambulatory Visit
Admission: RE | Admit: 2013-08-19 | Discharge: 2013-08-19 | Disposition: A | Discharge status: Home / Self Care | Payer: Medicare Other | Source: Ambulatory Visit | Attending: Radiation Oncology | Admitting: Radiation Oncology

## 2013-08-20 ENCOUNTER — Ambulatory Visit: Payer: Medicare Other

## 2013-08-21 ENCOUNTER — Ambulatory Visit: Payer: Medicare Other

## 2013-08-24 ENCOUNTER — Ambulatory Visit: Payer: Medicare Other

## 2013-08-26 NOTE — Progress Notes (Signed)
[facility] Psychosocial Distress Screening Clinical Social Work  Clinical Social Work was referred by distress screening protocol.  The patient scored a 7 on the Psychosocial Distress Thermometer which indicates moderate distress. Clinical Social Worker Intern telephoned to assess for distress and other psychosocial needs. Patient stated that "everything was fine".  [facility] confirmed that we were available should a need arise.   Clinical Social Worker follow up needed: no  If yes, follow up plan:   [name] [facility] Clinical Social Work Intern [employer] [facility] [phone]

## 2013-08-27 ENCOUNTER — Telehealth: Payer: Self-pay | Admitting: Oncology

## 2013-08-27 NOTE — Telephone Encounter (Addendum)
[name] called and said she has a new "bump" on the right side of her face below her eye.  She said it showed up about 2 weeks ago.  She is wanting to know if she should come in sooner for follow up.  Will notify Dr. Roselind Messier and call her back.  Per Dr. Roselind Messier, she can be seen today or Monday.  Called [name] back and she will make an appointment for Monday.  Transferred her to the scheduler.

## 2013-08-28 ENCOUNTER — Encounter: Payer: Self-pay | Admitting: Oncology

## 2013-08-29 ENCOUNTER — Encounter: Payer: Self-pay | Admitting: Radiation Oncology

## 2013-08-29 NOTE — Progress Notes (Signed)
   Department of Radiation Oncology  Phone:  [phone] Fax:        [phone]  Special treatment procedure note - performed on 07/30/2013   On 07/30/2013 the patient underwent CT simulation for planning for her radiation therapy directed at the right supraorbital area. The patient has had prior radiation therapy to this area as well as immediately inferior to this area of recurrence. Given the additional time in reviewing the patient's prior radiation fields as it relates to her current set up and the risk for increased toxicities with retreatment, this constitutes a special treatment procedure.  -----------------------------------  Billie Lade, PhD, MD

## 2013-08-29 NOTE — Progress Notes (Signed)
  Radiation Oncology         [phone] ________________________________  Name: CONNI KNIGHTON MRN: [id number]  Date: 08/29/2013  DOB: 1941-02-28  End of Treatment Note  Diagnosis:   Recurrent lymphoma     Indication for treatment:  Recurrent mass in the right supraorbital area and visual difficulties       Radiation treatment dates:   08/05/2013 through 08/19/2013  Site/dose:   Right supraorbital area 25 Gy in 10 fractions  Beams/energy:   IMRT- VMAT,  6 MV photons  Narrative: The patient tolerated radiation treatment relatively well.   The patient's tumor mass responded well to her radiation therapy. Her visual difficulties also improved.  Plan: The patient has completed radiation treatment. The patient will return to radiation oncology clinic for routine followup in one month. I advised them to call or return sooner if they have any questions or concerns related to their recovery or treatment.  -----------------------------------  Billie Lade, PhD, MD

## 2013-08-31 ENCOUNTER — Encounter: Payer: Self-pay | Admitting: Radiation Oncology

## 2013-08-31 ENCOUNTER — Ambulatory Visit
Admission: RE | Admit: 2013-08-31 | Discharge: 2013-08-31 | Disposition: A | Discharge status: Home / Self Care | Payer: Medicare Other | Source: Ambulatory Visit | Attending: Radiation Oncology | Admitting: Radiation Oncology

## 2013-08-31 VITALS — BP 139/62 | HR 92 | Temp 98.4°F | Wt 131.9 lb

## 2013-08-31 DIAGNOSIS — C8589 Other specified types of non-Hodgkin lymphoma, extranodal and solid organ sites: Secondary | ICD-10-CM

## 2013-08-31 NOTE — Progress Notes (Signed)
Patient here for routine follow up completion of radiation to right supraorbital area.Skin with mild redness and swelling.Continued  Pain/soreness down left side of face and neck relieved with pain medication.

## 2013-08-31 NOTE — Progress Notes (Signed)
Radiation Oncology         [phone] ________________________________  Name: [name] MRN: [id number]  Date: 08/31/2013  DOB: [birth year]  Follow-Up Visit Note and Reevaluation note  CC: [name], MD  [name], MD  Diagnosis:   Recurrent lymphoma  Interval Since Last Radiation:  2  weeks  Narrative:  The patient returns today for followup earlier than her scheduled one-month followup of her most recent treatment. Patient called late last week and then noticed some swelling in her right preauricular area and neck region in requested followup of these issues.  She is having discomfort in both regions. She has had good improvement in her swelling in the right supraorbital area and continues to have good vision out of her right eye. She has noticed a lot of itching in both eyes but no drainage                              ALLERGIES:  is allergic to advicor; crestor; lipitor; lovastatin; morphine and related; niaspan; nsaids; pravachol; sulfa antibiotics; vytorin; welchol; zetia; and zocor.  Meds: Current Outpatient Prescriptions  Medication Sig Dispense Refill  . ALPRAZolam (XANAX) 0.5 MG tablet Take 0.5-1 mg by mouth 3 (three) times daily as needed for sleep or anxiety.      . docusate sodium 100 MG CAPS Take 200 mg by mouth 3 (three) times daily as needed for constipation.  60 capsule  2  . esomeprazole (NEXIUM) 40 MG capsule Take 40 mg by mouth daily.        Marland Kitchen lidocaine-prilocaine (EMLA) cream Apply 1 application topically as needed (for port-a-cath access).      Marland Kitchen loratadine (CLARITIN) 10 MG tablet Take 10 mg by mouth daily.      . ondansetron (ZOFRAN-ODT) 8 MG disintegrating tablet Take 1 tablet (8 mg total) by mouth every 8 (eight) hours as needed for nausea.  20 tablet  1  . OVER THE COUNTER MEDICATION Take 5 mLs by mouth 2 (two) times daily at 10 AM and 5 PM. creamotion cough syrup      . oxyCODONE-acetaminophen (ROXICET) 5-325 MG per tablet Take 1-2  tablets by mouth every 4 (four) hours as needed for pain.  50 tablet  0  . polyethylene glycol powder (MIRALAX) powder Take 17 g by mouth daily.       . Polyvinyl Alcohol-Povidone (CLEAR EYES ALL SEASONS OP) Apply 1 drop to eye daily as needed (for dry, itchy eyes). For itchy & dry eyes      . megestrol (MEGACE ES) 625 MG/5ML suspension Take 5 mLs (625 mg total) by mouth daily.  150 mL  4   No current facility-administered medications for this encounter.    Physical Findings: The patient is in no acute distress. Patient is alert and oriented.  weight is 131 lb 14.4 oz (59.829 kg). Her temperature is 98.4 F (36.9 C). Her blood pressure is 139/62 and her pulse is 92. Her oxygen saturation is 100%. .  The right supraorbital mass has resolved at this time. Patient has now developed a mass in the right preauricular area estimated to be approximately 4 x 4 half centimeters. This is quite firm consistent with her prior lymphoma  presentations.  She also has a large right supraclavicular mass estimated to be approximate 6 x 7 cm fixed to surrounding tissues and also quite indurated. The right clavicular head also appears to be somewhat  swollen.  Lab Findings: Lab Results  Component Value Date   WBC 6.5 08/04/2013   HGB 10.0* 08/04/2013   HCT 30.9* 08/04/2013   MCV 106* 08/04/2013   PLT 181 08/04/2013      Radiographic Findings: No results found.  Impression:  The patient is recovering from the effects of radiation.  She has had a good response to her retreatment in the right supraorbital area but has developed new lymphoma masses within the right preauricular and right supraclavicular region.  Both of these areas are causing discomfort/pain for the patient. The patient would be a candidate for palliative therapy directed to this new areas. I should mention that she has received prior radiation therapy to the right preauricular area but with IMRT she could receive additional therapy.  Plan:   Simulation and planning on November 5 with treatments to begin early next week. Anticipate approximately 10-14 treatments.  _____________________________________  -----------------------------------  Billie Lade, PhD, MD

## 2013-09-02 ENCOUNTER — Ambulatory Visit
Admission: RE | Admit: 2013-09-02 | Discharge: 2013-09-02 | Disposition: A | Discharge status: Home / Self Care | Payer: Medicare Other | Source: Ambulatory Visit | Attending: Radiation Oncology | Admitting: Radiation Oncology

## 2013-09-02 DIAGNOSIS — C8589 Other specified types of non-Hodgkin lymphoma, extranodal and solid organ sites: Secondary | ICD-10-CM | POA: Insufficient documentation

## 2013-09-02 NOTE — Progress Notes (Signed)
  Radiation Oncology         [phone] ________________________________  Name: [name] MRN: [id number]  Date: 09/02/2013  DOB: [birth year]  SIMULATION AND TREATMENT PLANNING NOTE  DIAGNOSIS:  Recurrent lymphoma  NARRATIVE:  The patient was brought to the CT Simulation planning suite.  Identity was confirmed.  All relevant records and images related to the planned course of therapy were reviewed.  The patient freely provided informed written consent to proceed with treatment after reviewing the details related to the planned course of therapy. The consent form was witnessed and verified by the simulation staff.  Then, the patient was set-up in a stable reproducible  supine position for radiation therapy.  CT images were obtained.  Surface markings were placed.  The CT images were loaded into the planning software.  Then the target and avoidance structures were contoured.  Treatment planning then occurred.  The radiation prescription was entered and confirmed.  Then, I designed and supervised the construction of a total of 1 medically necessary complex treatment devices.  I have requested : Intensity Modulated Radiotherapy (IMRT) is medically necessary for this case for the following reason:  Previous treatment to this area..  I have ordered:dose calc.  PLAN:  The patient will receive 25 Gy in 10 fractions directed at the right preauricular area, right neck, right supraclavicular fossa  ________________________________  -----------------------------------  Billie Lade, PhD, MD

## 2013-09-03 ENCOUNTER — Other Ambulatory Visit: Payer: Self-pay

## 2013-09-04 ENCOUNTER — Other Ambulatory Visit (HOSPITAL_BASED_OUTPATIENT_CLINIC_OR_DEPARTMENT_OTHER): Payer: Medicare Other | Admitting: Lab

## 2013-09-04 ENCOUNTER — Ambulatory Visit (HOSPITAL_BASED_OUTPATIENT_CLINIC_OR_DEPARTMENT_OTHER): Payer: Medicare Other | Admitting: Hematology & Oncology

## 2013-09-04 VITALS — BP 128/59 | HR 86 | Temp 98.5°F | Resp 14 | Ht 62.0 in | Wt 131.0 lb

## 2013-09-04 DIAGNOSIS — Z992 Dependence on renal dialysis: Secondary | ICD-10-CM

## 2013-09-04 DIAGNOSIS — N19 Unspecified kidney failure: Secondary | ICD-10-CM

## 2013-09-04 DIAGNOSIS — D631 Anemia in chronic kidney disease: Secondary | ICD-10-CM

## 2013-09-04 DIAGNOSIS — N186 End stage renal disease: Secondary | ICD-10-CM

## 2013-09-04 DIAGNOSIS — C8330 Diffuse large B-cell lymphoma, unspecified site: Secondary | ICD-10-CM

## 2013-09-04 DIAGNOSIS — C8589 Other specified types of non-Hodgkin lymphoma, extranodal and solid organ sites: Secondary | ICD-10-CM

## 2013-09-04 LAB — CBC WITH DIFFERENTIAL (CANCER CENTER ONLY)
Eosinophils Absolute: 0.3 10*3/uL (ref 0.0–0.5)
LYMPH#: 2.5 10*3/uL (ref 0.9–3.3)
LYMPH%: 44 % (ref 14.0–48.0)
MCHC: 31.1 g/dL — ABNORMAL LOW (ref 32.0–36.0)
MCV: 107 fL — ABNORMAL HIGH (ref 81–101)
MONO#: 1 10*3/uL — ABNORMAL HIGH (ref 0.1–0.9)
NEUT#: 1.9 10*3/uL (ref 1.5–6.5)
Platelets: 128 10*3/uL — ABNORMAL LOW (ref 145–400)
RBC: 3.47 10*6/uL — ABNORMAL LOW (ref 3.70–5.32)
WBC: 5.8 10*3/uL (ref 3.9–10.0)

## 2013-09-04 MED ORDER — FENTANYL 12 MCG/HR TD PT72: 12.0000 ug | MEDICATED_PATCH | TRANSDERMAL | Status: DC

## 2013-09-04 NOTE — Progress Notes (Signed)
This office note has been dictated.

## 2013-09-06 NOTE — Progress Notes (Signed)
CC:   [name], M.D. Mindi Slicker. Lowell Guitar, M.D.  DIAGNOSIS:  Rapidly recurrent diffuse large cell lymphoma.  CURRENT THERAPY:  The patient recently completed palliative radiation.  [name] comes in for a visit.  Unfortunately, she seems to be relapsing pretty quickly.  She just completed some radiation therapy to another recurrence on her face.  She now has adenopathy basically on the right side of her face all the way down to her supraclavicular region. There is some slight pain over on the right neck.  She is getting dialysis.  She has renal failure.  Dialysis is pretty tough on her.  She does not have a lot of energy.  I would say that her performance status right now is probably ECOG 2.  She has had no fever.  She has had no bleeding.  Her appetite comes and goes.  She has had no diarrhea or constipation.  There has been no leg swelling.  She has had no rashes.  PHYSICAL EXAMINATION:  General:  This is a somewhat chronically ill appearing white female in no obvious distress.  Vital Signs: Temperature of 98.5, pulse 86, respiratory rate 18, blood pressure 120/59, and weight is 131 pounds.  Head and neck:  Obvious right preauricular swelling.  This extends down the right neck to the right supraclavicular region.  She has a relatively large right supraclavicular mass that is nonmobile.  She does have some slight swelling above the right eye.  Extraocular muscles are intact.  There are no intraoral lesions.  She has no left cervical or supraclavicular lymph nodes.  Lungs:  Clear bilaterally.  Cardiac:  Regular rate and rhythm with a normal S1, S2.  There are no murmurs, rubs, or bruits. Abdomen:  Soft.  She has slight decreased bowel sounds.  There is no fluid wave.  There is no palpable hepatosplenomegaly.  Extremities: Some trace edema in her legs.  She has some slight decreased strength in her legs bilaterally.  She has decent range of motion of her joints. Skin:   Slightly dry.  She has no rashes.  There is no ecchymosis or petechia.  Neurologic:  No focal neurological deficits.  LABORATORY STUDIES:  White cell count 5.8, hemoglobin 11.5, hematocrit 37, platelet count 128.  Sodium 139, potassium 4.1, BUN 20, creatinine 4.19.  Her LFTs were normal.  Her pre-albumin is 28, calcium 9.4 with an albumin of 3.9.  IMPRESSION:  [name] is a very nice 72 year old white female.  She has rapidly recurrent non-Hodgkin lymphoma.  We have been incredibly limited as to how we can help her.  She just has not wanted chemotherapy until now.  She had an incredibly hard time with chemotherapy for her recurrence back in the spring.  She was hospitalized for about a month.  She has developed renal failure.  We have been treating her with radiation.  It is apparent that this lymphoma is now beginning to become more aggressive.  This recurrence is incredibly rapid.  I talked to her at length.  Her husband was with her.  She clearly understands that we are not going to get rid of this lymphoma.  I told her that if she did not want treatment or if treatment did not work, then we will probably be looking at 2 or 3 months.  I told her that even if treatment worked, we might be able to give her 6 months or so.  This is very emotional for [name].  I clearly  understand this.  I really feel bad for her.  She has tried as much as she could try.  She has decided to try chemotherapy.  We have to be careful with her getting dialysis.  I think that we could consider Rituxan/gemcitabine/oxaliplatin.  I talked to our pharmacist.  We are adjusting her doses accordingly.  She has a Port-A-Cath which will really help.  We will see about getting her set up with a PET scan next week to see how extensive this lymphoma recurrence is.  I think the chance of her responding is probably going to be only 30% to 40%.  We can easily tell if she is going to respond by the  right supraclavicular lymphadenopathy.  She got a prescription for a fentanyl patch.  It is 12 mcg.  She will put this on her skin every 3 days.  I spent about an hour or more with [name] and her husband.  I have been with her now for almost 3 years.  She has done incredibly well. Unfortunately, I just do not think that she will make it through next year even if we have a response.  She has a strong faith.  We will definitely need to talk about hospice and palliative care issues in the near future.  I will try to get her back to see me for a second cycle of chemotherapy. Again, we can measure our response by the lymphadenopathy in her right neck.    ______________________________ Josph Macho, M.D. PRE/MEDQ  D:  09/04/2013  T:  09/05/2013  Job:  [id number]

## 2013-09-07 ENCOUNTER — Encounter: Payer: Self-pay | Admitting: Hematology & Oncology

## 2013-09-07 ENCOUNTER — Other Ambulatory Visit: Payer: Medicare Other | Admitting: Lab

## 2013-09-07 ENCOUNTER — Ambulatory Visit (HOSPITAL_BASED_OUTPATIENT_CLINIC_OR_DEPARTMENT_OTHER): Payer: Medicare Other

## 2013-09-07 VITALS — BP 159/76 | HR 81 | Temp 98.6°F | Resp 18

## 2013-09-07 DIAGNOSIS — C8589 Other specified types of non-Hodgkin lymphoma, extranodal and solid organ sites: Secondary | ICD-10-CM

## 2013-09-07 DIAGNOSIS — D631 Anemia in chronic kidney disease: Secondary | ICD-10-CM

## 2013-09-07 DIAGNOSIS — C8330 Diffuse large B-cell lymphoma, unspecified site: Secondary | ICD-10-CM

## 2013-09-07 DIAGNOSIS — C801 Malignant (primary) neoplasm, unspecified: Secondary | ICD-10-CM

## 2013-09-07 DIAGNOSIS — Z5111 Encounter for antineoplastic chemotherapy: Secondary | ICD-10-CM

## 2013-09-07 DIAGNOSIS — Z5112 Encounter for antineoplastic immunotherapy: Secondary | ICD-10-CM

## 2013-09-07 LAB — COMPREHENSIVE METABOLIC PANEL
Albumin: 3.9 g/dL (ref 3.5–5.2)
Alkaline Phosphatase: 48 U/L (ref 39–117)
CO2: 32 mEq/L (ref 19–32)
Calcium: 9.4 mg/dL (ref 8.4–10.5)
Chloride: 94 mEq/L — ABNORMAL LOW (ref 96–112)
Glucose, Bld: 117 mg/dL — ABNORMAL HIGH (ref 70–99)
Potassium: 4.1 mEq/L (ref 3.5–5.3)
Sodium: 139 mEq/L (ref 135–145)
Total Bilirubin: 0.3 mg/dL (ref 0.3–1.2)
Total Protein: 6.3 g/dL (ref 6.0–8.3)

## 2013-09-07 LAB — IRON AND TIBC CHCC
Iron: 47 ug/dL (ref 41–142)
TIBC: 159 ug/dL — ABNORMAL LOW (ref 236–444)
UIBC: 112 ug/dL — ABNORMAL LOW (ref 120–384)

## 2013-09-07 LAB — FERRITIN CHCC: Ferritin: 1027 ng/ml — ABNORMAL HIGH (ref 9–269)

## 2013-09-07 MED ORDER — DIPHENHYDRAMINE HCL 25 MG PO CAPS
50.0000 mg | ORAL_CAPSULE | Freq: Once | ORAL | Status: AC
  Administered 2013-09-07: 50 mg via ORAL

## 2013-09-07 MED ORDER — DEXAMETHASONE SODIUM PHOSPHATE 10 MG/ML IJ SOLN
10.0000 mg | Freq: Once | INTRAMUSCULAR | Status: AC
  Administered 2013-09-07: 10 mg via INTRAVENOUS

## 2013-09-07 MED ORDER — FAMCICLOVIR 125 MG PO TABS: ORAL_TABLET | ORAL | Status: DC

## 2013-09-07 MED ORDER — ONDANSETRON 8 MG PO TBDP: ORAL_TABLET | ORAL | Status: DC

## 2013-09-07 MED ORDER — ACETAMINOPHEN 325 MG PO TABS
650.0000 mg | ORAL_TABLET | Freq: Once | ORAL | Status: AC
  Administered 2013-09-07: 650 mg via ORAL

## 2013-09-07 MED ORDER — HEPARIN SOD (PORK) LOCK FLUSH 100 UNIT/ML IV SOLN
250.0000 [IU] | Freq: Once | INTRAVENOUS | Status: AC | PRN
  Filled 2013-09-07: qty 5

## 2013-09-07 MED ORDER — SODIUM CHLORIDE 0.9 % IJ SOLN
3.0000 mL | INTRAMUSCULAR | Status: DC | PRN
  Filled 2013-09-07: qty 10

## 2013-09-07 MED ORDER — ALTEPLASE 2 MG IJ SOLR
2.0000 mg | Freq: Once | INTRAMUSCULAR | Status: AC | PRN
  Filled 2013-09-07: qty 2

## 2013-09-07 MED ORDER — DEXAMETHASONE 4 MG PO TABS: ORAL_TABLET | ORAL | Status: DC

## 2013-09-07 MED ORDER — SODIUM CHLORIDE 0.9 % IJ SOLN
10.0000 mL | INTRAMUSCULAR | Status: DC | PRN
  Filled 2013-09-07: qty 10

## 2013-09-07 MED ORDER — ACETAMINOPHEN 325 MG PO TABS
ORAL_TABLET | ORAL | Status: AC
  Filled 2013-09-07: qty 2

## 2013-09-07 MED ORDER — DEXTROSE 5 % IV SOLN
Freq: Once | INTRAVENOUS | Status: AC
  Administered 2013-09-07: 09:00:00 via INTRAVENOUS

## 2013-09-07 MED ORDER — FAMCICLOVIR 250 MG PO TABS: ORAL_TABLET | ORAL | Status: DC

## 2013-09-07 MED ORDER — OXALIPLATIN CHEMO INJECTION 100 MG/20ML
60.0000 mg/m2 | Freq: Once | INTRAVENOUS | Status: AC
  Administered 2013-09-07: 95 mg via INTRAVENOUS
  Filled 2013-09-07: qty 19

## 2013-09-07 MED ORDER — SODIUM CHLORIDE 0.9 % IV SOLN
375.0000 mg/m2 | Freq: Once | INTRAVENOUS | Status: AC
  Administered 2013-09-07: 600 mg via INTRAVENOUS
  Filled 2013-09-07: qty 60

## 2013-09-07 MED ORDER — HEPARIN SOD (PORK) LOCK FLUSH 100 UNIT/ML IV SOLN
500.0000 [IU] | Freq: Once | INTRAVENOUS | Status: AC | PRN
  Filled 2013-09-07: qty 5

## 2013-09-07 MED ORDER — DIPHENHYDRAMINE HCL 25 MG PO CAPS
ORAL_CAPSULE | ORAL | Status: AC
  Filled 2013-09-07: qty 2

## 2013-09-07 MED ORDER — ONDANSETRON 8 MG/50ML IVPB (CHCC)
8.0000 mg | Freq: Once | INTRAVENOUS | Status: AC
  Administered 2013-09-07: 8 mg via INTRAVENOUS

## 2013-09-07 MED ORDER — GEMCITABINE HCL CHEMO INJECTION 1 GM/26.3ML
750.0000 mg/m2 | Freq: Once | INTRAVENOUS | Status: AC
  Administered 2013-09-07: 1216 mg via INTRAVENOUS
  Filled 2013-09-07: qty 31.98

## 2013-09-07 MED ORDER — SODIUM CHLORIDE 0.9 % IV SOLN: Freq: Once | INTRAVENOUS | Status: DC

## 2013-09-07 MED ORDER — DEXAMETHASONE SODIUM PHOSPHATE 10 MG/ML IJ SOLN
INTRAMUSCULAR | Status: AC
  Filled 2013-09-07: qty 1

## 2013-09-07 MED ORDER — SODIUM CHLORIDE 0.9 % IV SOLN: 1200.0000 mg | Freq: Once | INTRAVENOUS | Status: DC

## 2013-09-07 NOTE — Patient Instructions (Addendum)
[facility] Discharge Instructions for Patients Receiving Chemotherapy  Today you received the following chemotherapy agents rituxan, oxaliplatin and gemzar  To help prevent nausea and vomiting after your treatment, we encourage you to take your nausea medication:  Zofran 8mg  ODT - take 1 tab twice daily for 3 days after chemo,  then twice daily as needed for nausea or vomiting;  Decadron 4mg  - take 2 tabs twice daily (with a meal) for 3 days after chemo then stop. Resume with each chemo treatment cycle.   Compazine 10 mg - take 1 tab every 8 hours as needed for nausea or vomiting.  To help prevent shingles, please take the following:  Famvir 125 mg - take 1 tab on T-Th-S AFTER dialysis.   Medication to numb your Port:  EMLA Cream - Apply quarter size application to [place] site 1 hour prior to chemotherapy. Do NOT rub in. Cover area with plastic wrap.    If you develop nausea and vomiting that is not controlled by your nausea medication, call the clinic. If it is after clinic hours your family physician or the after hours number for the clinic or go to the Emergency Department.   BELOW ARE SYMPTOMS THAT SHOULD BE REPORTED IMMEDIATELY:  *FEVER GREATER THAN 100.5 F  *CHILLS WITH OR WITHOUT FEVER  NAUSEA AND VOMITING THAT IS NOT CONTROLLED WITH YOUR NAUSEA MEDICATION  *UNUSUAL SHORTNESS OF BREATH  *UNUSUAL BRUISING OR BLEEDING  TENDERNESS IN MOUTH AND THROAT WITH OR WITHOUT PRESENCE OF ULCERS  *URINARY PROBLEMS  *BOWEL PROBLEMS  UNUSUAL RASH Items with * indicate a potential emergency and should be followed up as soon as possible.  One of the nurses will contact you 24 hours after your treatment. Please let the nurse know about any problems that you may have experienced. Feel free to call the clinic you have any questions or concerns. The clinic phone number is [phone].   I have been informed and understand all the instructions given to me. I know to  contact the clinic, my physician, or go to the Emergency Department if any problems should occur. I do not have any questions at this time, but understand that I may call the clinic during office hours   should I have any questions or need assistance in obtaining follow up care.    __________________________________________  _____________  __________ Signature of Patient or Authorized Representative            Date                   Time    __________________________________________ Nurse's Signature

## 2013-09-08 ENCOUNTER — Encounter: Payer: Self-pay | Admitting: Vascular Surgery

## 2013-09-09 ENCOUNTER — Ambulatory Visit (INDEPENDENT_AMBULATORY_CARE_PROVIDER_SITE_OTHER): Payer: Medicare Other | Admitting: Vascular Surgery

## 2013-09-09 ENCOUNTER — Ambulatory Visit (HOSPITAL_COMMUNITY)
Admit: 2013-09-09 | Discharge: 2013-09-09 | Disposition: A | Discharge status: Home / Self Care | Payer: Medicare Other | Source: Ambulatory Visit | Attending: Vascular Surgery | Admitting: Vascular Surgery

## 2013-09-09 ENCOUNTER — Telehealth: Payer: Self-pay | Admitting: Oncology

## 2013-09-09 ENCOUNTER — Encounter: Payer: Self-pay | Admitting: Vascular Surgery

## 2013-09-09 ENCOUNTER — Ambulatory Visit: Payer: Medicare Other | Admitting: Radiation Oncology

## 2013-09-09 VITALS — BP 150/75 | HR 86 | Ht 62.0 in | Wt 132.8 lb

## 2013-09-09 DIAGNOSIS — N186 End stage renal disease: Secondary | ICD-10-CM

## 2013-09-09 DIAGNOSIS — Z4931 Encounter for adequacy testing for hemodialysis: Secondary | ICD-10-CM | POA: Insufficient documentation

## 2013-09-09 NOTE — Assessment & Plan Note (Signed)
Her left upper arm fistula appears to be maturing adequately. I think this should be ready for dialysis in early January of 2015. I will see her as needed.

## 2013-09-09 NOTE — Telephone Encounter (Signed)
Tried to call Connye Burkitt twice yesterday with no answer.  Will try again today.

## 2013-09-09 NOTE — Progress Notes (Signed)
Patient name: [name] MRN: [id number] DOB: 11/01/1940 Sex: female  REASON FOR VISIT: follow up after her left brachiocephalic AV fistula.  HPI: [name] is a 72 y.o. female who had a left brachiocephalic AV fistula placed on 82/95/6213. I explored her forearm cephalic vein but this was not adequate. I therefore placed an upper arm fistula. She has no complaints referable to her fistula. He is dialyzing via a left IJ tunneled dialysis catheter. He has a port on the right side for chemotherapy.  REVIEW OF SYSTEMS: Arly.Keller ] denotes positive finding; [  ] denotes negative finding  CARDIOVASCULAR:  [ ]  chest pain   [ ]  dyspnea on exertion    CONSTITUTIONAL:  [ ]  fever   [ ]  chills  PHYSICAL EXAM: Filed Vitals:   09/09/13 1052  BP: 150/75  Pulse: 86  Height: 5\' 2"  (1.575 m)  Weight: 132 lb 12.8 oz (60.238 kg)  SpO2: 99%   Body mass index is 24.28 kg/(m^2). GENERAL: The patient is a well-nourished female, in no acute distress. The vital signs are documented above. CARDIOVASCULAR: There is a regular rate and rhythm  PULMONARY: There is good air exchange bilaterally without wheezing or rales. Her left upper arm fistula has an excellent bruit and thrill. She has a palpable left radial pulse.  I have independently interpreted her duplex scan of her fistula which shows that the fistula has adequate diameters ranging from 0.56-0.66 cm. No areas of narrowing are noted.  MEDICAL ISSUES:  End stage renal disease Her left upper arm fistula appears to be maturing adequately. I think this should be ready for dialysis in early January of 2015. I will see her as needed.   DICKSON,CHRISTOPHER S [facility]: [phone]

## 2013-09-10 ENCOUNTER — Ambulatory Visit: Payer: Medicare Other

## 2013-09-11 ENCOUNTER — Ambulatory Visit: Payer: Medicare Other

## 2013-09-14 ENCOUNTER — Ambulatory Visit (HOSPITAL_COMMUNITY)
Admission: RE | Admit: 2013-09-14 | Discharge: 2013-09-14 | Disposition: A | Discharge status: Home / Self Care | Payer: Medicare Other | Source: Ambulatory Visit | Attending: Hematology & Oncology | Admitting: Hematology & Oncology

## 2013-09-14 ENCOUNTER — Ambulatory Visit: Payer: Medicare Other

## 2013-09-14 DIAGNOSIS — K7689 Other specified diseases of liver: Secondary | ICD-10-CM | POA: Insufficient documentation

## 2013-09-14 DIAGNOSIS — I2510 Atherosclerotic heart disease of native coronary artery without angina pectoris: Secondary | ICD-10-CM | POA: Insufficient documentation

## 2013-09-14 DIAGNOSIS — C8589 Other specified types of non-Hodgkin lymphoma, extranodal and solid organ sites: Secondary | ICD-10-CM | POA: Insufficient documentation

## 2013-09-14 DIAGNOSIS — R599 Enlarged lymph nodes, unspecified: Secondary | ICD-10-CM | POA: Insufficient documentation

## 2013-09-14 DIAGNOSIS — I700 Atherosclerosis of aorta: Secondary | ICD-10-CM | POA: Insufficient documentation

## 2013-09-14 LAB — GLUCOSE, CAPILLARY: Glucose-Capillary: 107 mg/dL — ABNORMAL HIGH (ref 70–99)

## 2013-09-14 MED ORDER — FLUDEOXYGLUCOSE F - 18 (FDG) INJECTION
19.4000 | Freq: Once | INTRAVENOUS | Status: AC | PRN
  Administered 2013-09-14: 19.4 via INTRAVENOUS

## 2013-09-15 ENCOUNTER — Ambulatory Visit: Payer: Medicare Other

## 2013-09-16 ENCOUNTER — Ambulatory Visit: Payer: Medicare Other

## 2013-09-16 ENCOUNTER — Encounter: Payer: Self-pay | Admitting: Radiation Oncology

## 2013-09-16 NOTE — Progress Notes (Signed)
  Radiation Oncology         [phone] ________________________________  Name: [name] MRN: [id number]  Date: 09/16/2013  DOB: [birth year]  Progress Note  Diagnosis:    Recurrent lymphoma    On November 5 the patient underwent planning for treatments to the right neck for her rapidly recurring lymphoma. After the patient underwent simulation she did meet with Dr. Myna Hidalgo and after careful consideration she is decided to proceed with chemotherapy again. In light of this issue the patient's planned radiation therapy will be held.      Billie Lade, PhD, MD

## 2013-09-17 ENCOUNTER — Ambulatory Visit: Payer: Medicare Other

## 2013-09-18 ENCOUNTER — Ambulatory Visit: Payer: Medicare Other

## 2013-09-21 ENCOUNTER — Other Ambulatory Visit (HOSPITAL_BASED_OUTPATIENT_CLINIC_OR_DEPARTMENT_OTHER): Payer: Medicare Other | Admitting: Lab

## 2013-09-21 ENCOUNTER — Ambulatory Visit (HOSPITAL_BASED_OUTPATIENT_CLINIC_OR_DEPARTMENT_OTHER): Payer: Medicare Other | Admitting: Hematology & Oncology

## 2013-09-21 ENCOUNTER — Ambulatory Visit: Payer: Medicare Other | Admitting: Radiation Oncology

## 2013-09-21 ENCOUNTER — Ambulatory Visit: Payer: Medicare Other

## 2013-09-21 ENCOUNTER — Ambulatory Visit (HOSPITAL_BASED_OUTPATIENT_CLINIC_OR_DEPARTMENT_OTHER): Payer: Medicare Other

## 2013-09-21 ENCOUNTER — Other Ambulatory Visit: Payer: Medicare Other | Admitting: Lab

## 2013-09-21 VITALS — BP 126/57 | HR 57 | Temp 98.4°F | Resp 14 | Ht 62.0 in | Wt 130.0 lb

## 2013-09-21 VITALS — BP 129/77 | HR 76 | Temp 97.5°F | Resp 16

## 2013-09-21 DIAGNOSIS — D631 Anemia in chronic kidney disease: Secondary | ICD-10-CM

## 2013-09-21 DIAGNOSIS — C8589 Other specified types of non-Hodgkin lymphoma, extranodal and solid organ sites: Secondary | ICD-10-CM

## 2013-09-21 DIAGNOSIS — Z5112 Encounter for antineoplastic immunotherapy: Secondary | ICD-10-CM

## 2013-09-21 DIAGNOSIS — C8330 Diffuse large B-cell lymphoma, unspecified site: Secondary | ICD-10-CM

## 2013-09-21 DIAGNOSIS — Z5111 Encounter for antineoplastic chemotherapy: Secondary | ICD-10-CM

## 2013-09-21 DIAGNOSIS — C801 Malignant (primary) neoplasm, unspecified: Secondary | ICD-10-CM

## 2013-09-21 LAB — CBC WITH DIFFERENTIAL (CANCER CENTER ONLY)
EOS%: 4.3 % (ref 0.0–7.0)
Eosinophils Absolute: 0.1 10*3/uL (ref 0.0–0.5)
HGB: 11 g/dL — ABNORMAL LOW (ref 11.6–15.9)
LYMPH#: 1.4 10*3/uL (ref 0.9–3.3)
MCH: 32.6 pg (ref 26.0–34.0)
MCV: 104 fL — ABNORMAL HIGH (ref 81–101)
MONO#: 0.7 10*3/uL (ref 0.1–0.9)
MONO%: 22.9 % — ABNORMAL HIGH (ref 0.0–13.0)
NEUT#: 0.7 10*3/uL — ABNORMAL LOW (ref 1.5–6.5)
NEUT%: 22 % — ABNORMAL LOW (ref 39.6–80.0)
Platelets: 80 10*3/uL — ABNORMAL LOW (ref 145–400)
RBC: 3.37 10*6/uL — ABNORMAL LOW (ref 3.70–5.32)
WBC: 3 10*3/uL — ABNORMAL LOW (ref 3.9–10.0)

## 2013-09-21 LAB — BASIC METABOLIC PANEL - CANCER CENTER ONLY
Chloride: 93 mEq/L — ABNORMAL LOW (ref 98–108)
Creat: 5.7 mg/dl (ref 0.6–1.2)
Glucose, Bld: 132 mg/dL — ABNORMAL HIGH (ref 73–118)
Potassium: 4.7 mEq/L (ref 3.3–4.7)
Sodium: 144 mEq/L (ref 128–145)

## 2013-09-21 MED ORDER — HEPARIN SOD (PORK) LOCK FLUSH 100 UNIT/ML IV SOLN
250.0000 [IU] | Freq: Once | INTRAVENOUS | Status: DC | PRN
  Filled 2013-09-21: qty 5

## 2013-09-21 MED ORDER — ONDANSETRON 8 MG/50ML IVPB (CHCC)
8.0000 mg | Freq: Once | INTRAVENOUS | Status: AC
  Administered 2013-09-21: 8 mg via INTRAVENOUS

## 2013-09-21 MED ORDER — DEXAMETHASONE SODIUM PHOSPHATE 10 MG/ML IJ SOLN
10.0000 mg | Freq: Once | INTRAMUSCULAR | Status: AC
  Administered 2013-09-21: 10 mg via INTRAVENOUS

## 2013-09-21 MED ORDER — SODIUM CHLORIDE 0.9 % IV SOLN
375.0000 mg/m2 | Freq: Once | INTRAVENOUS | Status: AC
  Administered 2013-09-21: 600 mg via INTRAVENOUS
  Filled 2013-09-21: qty 60

## 2013-09-21 MED ORDER — ACETAMINOPHEN 325 MG PO TABS
650.0000 mg | ORAL_TABLET | Freq: Once | ORAL | Status: AC
  Administered 2013-09-21: 650 mg via ORAL

## 2013-09-21 MED ORDER — SODIUM CHLORIDE 0.9 % IV SOLN
Freq: Once | INTRAVENOUS | Status: AC
  Administered 2013-09-21: 09:00:00 via INTRAVENOUS

## 2013-09-21 MED ORDER — HEPARIN SOD (PORK) LOCK FLUSH 100 UNIT/ML IV SOLN
500.0000 [IU] | Freq: Once | INTRAVENOUS | Status: DC | PRN
  Filled 2013-09-21: qty 5

## 2013-09-21 MED ORDER — SODIUM CHLORIDE 0.9 % IJ SOLN
3.0000 mL | INTRAMUSCULAR | Status: DC | PRN
  Filled 2013-09-21: qty 10

## 2013-09-21 MED ORDER — SODIUM CHLORIDE 0.9 % IV SOLN
750.0000 mg/m2 | Freq: Once | INTRAVENOUS | Status: AC
  Administered 2013-09-21: 1216 mg via INTRAVENOUS
  Filled 2013-09-21: qty 31.98

## 2013-09-21 MED ORDER — ACETAMINOPHEN 325 MG PO TABS
ORAL_TABLET | ORAL | Status: AC
  Filled 2013-09-21: qty 2

## 2013-09-21 MED ORDER — OXALIPLATIN CHEMO INJECTION 100 MG/20ML
60.0000 mg/m2 | Freq: Once | INTRAVENOUS | Status: AC
  Administered 2013-09-21: 95 mg via INTRAVENOUS
  Filled 2013-09-21: qty 19

## 2013-09-21 MED ORDER — DEXAMETHASONE SODIUM PHOSPHATE 10 MG/ML IJ SOLN
INTRAMUSCULAR | Status: AC
  Filled 2013-09-21: qty 1

## 2013-09-21 MED ORDER — DIPHENHYDRAMINE HCL 25 MG PO CAPS
50.0000 mg | ORAL_CAPSULE | Freq: Once | ORAL | Status: AC
  Administered 2013-09-21: 50 mg via ORAL

## 2013-09-21 MED ORDER — ALTEPLASE 2 MG IJ SOLR
2.0000 mg | Freq: Once | INTRAMUSCULAR | Status: DC | PRN
  Filled 2013-09-21: qty 2

## 2013-09-21 MED ORDER — DIPHENHYDRAMINE HCL 25 MG PO CAPS
ORAL_CAPSULE | ORAL | Status: AC
  Filled 2013-09-21: qty 2

## 2013-09-21 MED ORDER — DEXTROSE 5 % IV SOLN
Freq: Once | INTRAVENOUS | Status: AC
  Administered 2013-09-21: 13:00:00 via INTRAVENOUS

## 2013-09-21 MED ORDER — SODIUM CHLORIDE 0.9 % IJ SOLN
10.0000 mL | INTRAMUSCULAR | Status: DC | PRN
  Filled 2013-09-21: qty 10

## 2013-09-21 NOTE — Progress Notes (Signed)
This office note has been dictated.

## 2013-09-21 NOTE — Patient Instructions (Signed)
[facility] Discharge Instructions for Patients Receiving Chemotherapy  Today you received the following chemotherapy agents Rituxan, Oxaliplatin, Gemzar  To help prevent nausea and vomiting after your treatment, we encourage you to take your nausea medication    If you develop nausea and vomiting that is not controlled by your nausea medication, call the clinic.   BELOW ARE SYMPTOMS THAT SHOULD BE REPORTED IMMEDIATELY:  *FEVER GREATER THAN 100.5 F  *CHILLS WITH OR WITHOUT FEVER  NAUSEA AND VOMITING THAT IS NOT CONTROLLED WITH YOUR NAUSEA MEDICATION  *UNUSUAL SHORTNESS OF BREATH  *UNUSUAL BRUISING OR BLEEDING  TENDERNESS IN MOUTH AND THROAT WITH OR WITHOUT PRESENCE OF ULCERS  *URINARY PROBLEMS  *BOWEL PROBLEMS  UNUSUAL RASH Items with * indicate a potential emergency and should be followed up as soon as possible.  Feel free to call the clinic you have any questions or concerns. The clinic phone number is [phone].

## 2013-09-22 ENCOUNTER — Ambulatory Visit: Payer: Medicare Other

## 2013-09-28 ENCOUNTER — Other Ambulatory Visit: Payer: Self-pay | Admitting: Hematology & Oncology

## 2013-09-28 ENCOUNTER — Ambulatory Visit: Payer: Medicare Other | Admitting: Radiation Oncology

## 2013-09-29 ENCOUNTER — Ambulatory Visit: Payer: Medicare Other

## 2013-10-01 ENCOUNTER — Emergency Department (HOSPITAL_COMMUNITY)
Admission: EM | Admit: 2013-10-01 | Discharge: 2013-10-02 | Disposition: A | Discharge status: Home / Self Care | Payer: Medicare Other | Attending: Emergency Medicine | Admitting: Emergency Medicine

## 2013-10-01 ENCOUNTER — Emergency Department (HOSPITAL_COMMUNITY): Payer: Medicare Other

## 2013-10-01 ENCOUNTER — Encounter (HOSPITAL_COMMUNITY): Payer: Self-pay | Admitting: Emergency Medicine

## 2013-10-01 DIAGNOSIS — I252 Old myocardial infarction: Secondary | ICD-10-CM | POA: Insufficient documentation

## 2013-10-01 DIAGNOSIS — E119 Type 2 diabetes mellitus without complications: Secondary | ICD-10-CM | POA: Insufficient documentation

## 2013-10-01 DIAGNOSIS — C8589 Other specified types of non-Hodgkin lymphoma, extranodal and solid organ sites: Secondary | ICD-10-CM | POA: Insufficient documentation

## 2013-10-01 DIAGNOSIS — Z95818 Presence of other cardiac implants and grafts: Secondary | ICD-10-CM | POA: Insufficient documentation

## 2013-10-01 DIAGNOSIS — Z9089 Acquired absence of other organs: Secondary | ICD-10-CM | POA: Insufficient documentation

## 2013-10-01 DIAGNOSIS — N186 End stage renal disease: Secondary | ICD-10-CM | POA: Insufficient documentation

## 2013-10-01 DIAGNOSIS — R109 Unspecified abdominal pain: Secondary | ICD-10-CM

## 2013-10-01 DIAGNOSIS — Z8659 Personal history of other mental and behavioral disorders: Secondary | ICD-10-CM | POA: Insufficient documentation

## 2013-10-01 DIAGNOSIS — IMO0002 Reserved for concepts with insufficient information to code with codable children: Secondary | ICD-10-CM | POA: Insufficient documentation

## 2013-10-01 DIAGNOSIS — Z992 Dependence on renal dialysis: Secondary | ICD-10-CM | POA: Insufficient documentation

## 2013-10-01 DIAGNOSIS — Z90710 Acquired absence of both cervix and uterus: Secondary | ICD-10-CM | POA: Insufficient documentation

## 2013-10-01 DIAGNOSIS — R110 Nausea: Secondary | ICD-10-CM | POA: Insufficient documentation

## 2013-10-01 DIAGNOSIS — Z79899 Other long term (current) drug therapy: Secondary | ICD-10-CM | POA: Insufficient documentation

## 2013-10-01 DIAGNOSIS — I120 Hypertensive chronic kidney disease with stage 5 chronic kidney disease or end stage renal disease: Secondary | ICD-10-CM | POA: Insufficient documentation

## 2013-10-01 DIAGNOSIS — M1610 Unilateral primary osteoarthritis, unspecified hip: Secondary | ICD-10-CM | POA: Insufficient documentation

## 2013-10-01 DIAGNOSIS — D631 Anemia in chronic kidney disease: Secondary | ICD-10-CM | POA: Insufficient documentation

## 2013-10-01 DIAGNOSIS — Z87891 Personal history of nicotine dependence: Secondary | ICD-10-CM | POA: Insufficient documentation

## 2013-10-01 LAB — CBC WITH DIFFERENTIAL/PLATELET
Basophils Absolute: 0 10*3/uL (ref 0.0–0.1)
Basophils Relative: 0 % (ref 0–1)
Eosinophils Absolute: 0 10*3/uL (ref 0.0–0.7)
Eosinophils Relative: 0 % (ref 0–5)
HCT: 34.6 % — ABNORMAL LOW (ref 36.0–46.0)
Hemoglobin: 11.5 g/dL — ABNORMAL LOW (ref 12.0–15.0)
Lymphocytes Relative: 38 % (ref 12–46)
Lymphs Abs: 1.3 10*3/uL (ref 0.7–4.0)
MCH: 33 pg (ref 26.0–34.0)
MCHC: 33.2 g/dL (ref 30.0–36.0)
MCV: 99.4 fL (ref 78.0–100.0)
Monocytes Absolute: 0.8 10*3/uL (ref 0.1–1.0)
Monocytes Relative: 23 % — ABNORMAL HIGH (ref 3–12)
Neutro Abs: 1.3 10*3/uL — ABNORMAL LOW (ref 1.7–7.7)
Neutrophils Relative %: 39 % — ABNORMAL LOW (ref 43–77)
Platelets: 53 10*3/uL — ABNORMAL LOW (ref 150–400)
RBC: 3.48 MIL/uL — ABNORMAL LOW (ref 3.87–5.11)
RDW: 16 % — ABNORMAL HIGH (ref 11.5–15.5)
WBC: 3.4 10*3/uL — ABNORMAL LOW (ref 4.0–10.5)

## 2013-10-01 LAB — COMPREHENSIVE METABOLIC PANEL
ALT: 31 U/L (ref 0–35)
AST: 46 U/L — ABNORMAL HIGH (ref 0–37)
Albumin: 3.7 g/dL (ref 3.5–5.2)
Alkaline Phosphatase: 61 U/L (ref 39–117)
BUN: 6 mg/dL (ref 6–23)
CO2: 31 mEq/L (ref 19–32)
Calcium: 9.3 mg/dL (ref 8.4–10.5)
Chloride: 92 mEq/L — ABNORMAL LOW (ref 96–112)
Creatinine, Ser: 2.44 mg/dL — ABNORMAL HIGH (ref 0.50–1.10)
GFR calc Af Amer: 22 mL/min — ABNORMAL LOW (ref >90)
GFR calc non Af Amer: 19 mL/min — ABNORMAL LOW (ref >90)
Glucose, Bld: 170 mg/dL — ABNORMAL HIGH (ref 70–99)
Potassium: 3.6 mEq/L (ref 3.5–5.1)
Sodium: 137 mEq/L (ref 135–145)
Total Bilirubin: 0.4 mg/dL (ref 0.3–1.2)
Total Protein: 7 g/dL (ref 6.0–8.3)

## 2013-10-01 LAB — LIPASE, BLOOD: Lipase: 59 U/L (ref 11–59)

## 2013-10-01 MED ORDER — FENTANYL CITRATE 0.05 MG/ML IJ SOLN
100.0000 ug | Freq: Once | INTRAMUSCULAR | Status: AC
  Administered 2013-10-01: 100 ug via INTRAVENOUS
  Filled 2013-10-01: qty 2

## 2013-10-01 MED ORDER — IOHEXOL 300 MG/ML  SOLN
50.0000 mL | Freq: Once | INTRAMUSCULAR | Status: AC | PRN
  Administered 2013-10-01: 50 mL via ORAL

## 2013-10-01 MED ORDER — ONDANSETRON HCL 4 MG/2ML IJ SOLN
4.0000 mg | Freq: Once | INTRAMUSCULAR | Status: AC
  Administered 2013-10-01: 4 mg via INTRAVENOUS
  Filled 2013-10-01: qty 2

## 2013-10-01 NOTE — ED Notes (Signed)
Pt un able to void at this time. Pt stated that she only goes 2x a day the patient is a dialysis patient. rn notifited and physician notifited

## 2013-10-01 NOTE — ED Notes (Signed)
Patient is alert and oriented x3.  She is complaining of lower abdominal pain that started this afternoon after she Returned home from dialysis.  Currently she rates her pain 10 of 10 with nausea.  She has had one incidence of this  Type of pain that resulted in a SBO a few months ago where she was admitted and a NG tube was placed.

## 2013-10-01 NOTE — ED Provider Notes (Signed)
CSN: [id number]     Arrival date & time 10/01/13  1830 History   First MD Initiated Contact with Patient 10/01/13 2010     Chief Complaint  Patient presents with  . Abdominal Pain   (Consider location/radiation/quality/duration/timing/severity/associated sxs/prior Treatment) HPI Patient presents emergency room with lower abdominal pain, that started this afternoon after dialysis.  Patient, states, that she does have some nausea, but no vomiting.  She, states she's had a previous history of small bowel obstruction.  Patient is currently taking chemotherapy for lymphoma.  Patient denies chest pain, shortness breath, headache, blurred vision, weakness, numbness, dizziness, dysuria, diarrhea, or fever.  Patient denies take any medications prior to arrival Past Medical History  Diagnosis Date  . Hypertension   . Anginal pain   . Headache(784.0)   . Anxiety     h/o anxiety attacks  . Myocardial infarction 2011  . Arthritis     Left hip  . Anemia of renal disease 09/17/2012  . Bleeding   . Blood transfusion without reported diagnosis     hx of  . Claustrophobia   . Panic attacks   . Cancer 11/2009, 04/2013    lymphoma, NHL B cell  . Hx of radiation therapy 04/2013    right supraorbital region  . Dysrhythmia     Atrial fibrillation  . Diabetes mellitus without complication     not on medication  . Acute renal failure     Tu,Th,Sat   [employer]  . History of radiation therapy 07/2013    25 gy to right supraorbital mass   Past Surgical History  Procedure Laterality Date  . Abdominal hysterectomy      30 yrs. ago  . Appendectomy  2009  . Tonsillectomy      age 21  . Cardiac catheterization  2011  . Rotator cuff repair      Right arm  . Esophagogastroduodenoscopy (egd) with propofol  10/06/2012    Procedure: ESOPHAGOGASTRODUODENOSCOPY (EGD) WITH PROPOFOL;  Surgeon: Willis Modena, MD;  Location: [facility];  Service: Endoscopy;  Laterality: N/A;  . Colonoscopy with propofol   10/06/2012    Procedure: COLONOSCOPY WITH PROPOFOL;  Surgeon: Willis Modena, MD;  Location: [facility];  Service: Endoscopy;  Laterality: N/A;  . Colonoscopy with propofol N/A 01/14/2013    Procedure: COLONOSCOPY WITH PROPOFOL;  Surgeon: Willis Modena, MD;  Location: [facility];  Service: Endoscopy;  Laterality: N/A;  . Portacath placement Right   . Portacath placement Right 01/22/2013  . Av fistula placement Left 07/24/2013    Procedure: ARTERIOVENOUS (AV) FISTULA CREATION- LEFT ;  Surgeon: Chuck Hint, MD;  Location: [facility] OR;  Service: Vascular;  Laterality: Left;   Family History  Problem Relation Age of Onset  . Cancer Mother     throat  . Alzheimer's disease Sister   . Cancer Brother     lymphoma  . Cancer Sister     unknown type   History  Substance Use Topics  . Smoking status: Former Smoker -- 0.25 packs/day for 15 years    Quit date: 04/01/1964  . Smokeless tobacco: Never Used  . Alcohol Use: No   OB History   Grav Para Term Preterm Abortions TAB SAB Ect Mult Living                 Review of Systems All other systems negative except as documented in the HPI. All pertinent positives and negatives as reviewed in the HPI. Allergies  Advicor; Crestor; Lipitor; Lovastatin;  Morphine and related; Niaspan; Nsaids; Pravachol; Sulfa antibiotics; Vytorin; Welchol; Zetia; and Zocor  Home Medications   Current Outpatient Rx  Name  Route  Sig  Dispense  Refill  . dexamethasone (DECADRON) 4 MG tablet      Take 2 tabs daily with a meal for 3 days starting the day after chemo.   24 tablet   2   . docusate sodium 100 MG CAPS   Oral   Take 200 mg by mouth 3 (three) times daily as needed for constipation.   60 capsule   2   . esomeprazole (NEXIUM) 40 MG capsule   Oral   Take 40 mg by mouth daily.           . famciclovir (FAMVIR) 125 MG tablet      Take 1 tablet on T-Th-Sat AFTER dialysis   30 tablet   2   . fentaNYL (DURAGESIC) 12 MCG/HR    Transdermal   Place 1 patch (12.5 mcg total) onto the skin every 3 (three) days.   10 patch   0   . lidocaine-prilocaine (EMLA) cream   Topical   Apply 1 application topically as needed (for port-a-cath access).         Marland Kitchen loratadine (CLARITIN) 10 MG tablet   Oral   Take 10 mg by mouth daily.         . multivitamin (RENA-VIT) TABS tablet   Oral   Take 1 tablet by mouth daily.         . ondansetron (ZOFRAN-ODT) 8 MG disintegrating tablet      Take 1 tab twice daily starting the day after chemo x 3 days then twice daily as needed for nausea   20 tablet   1   . OVER THE COUNTER MEDICATION   Oral   Take 5 mLs by mouth 2 (two) times daily at 10 AM and 5 PM. creamotion cough syrup         . oxyCODONE-acetaminophen (ROXICET) 5-325 MG per tablet   Oral   Take 1-2 tablets by mouth every 4 (four) hours as needed for pain.   50 tablet   0   . RENVELA 800 MG tablet   Oral   Take 800 mg by mouth 3 (three) times daily with meals.           BP 175/70  Pulse 100  Temp(Src) 99 F (37.2 C) (Oral)  Resp 21  SpO2 100% Physical Exam  Nursing note and vitals reviewed. Constitutional: She appears well-developed and well-nourished. No distress.  HENT:  Head: Normocephalic and atraumatic.  Eyes: Pupils are equal, round, and reactive to light.  Neck: Normal range of motion. Neck supple.  Cardiovascular: Normal rate, regular rhythm and normal heart sounds.   Pulmonary/Chest: Effort normal and breath sounds normal.  Abdominal: Soft. Normal appearance and bowel sounds are normal. She exhibits no distension. There is tenderness. There is no guarding.      ED Course  Procedures (including critical care time) Labs Review Labs Reviewed  CBC WITH DIFFERENTIAL - Abnormal; Notable for the following:    WBC 3.4 (*)    RBC 3.48 (*)    Hemoglobin 11.5 (*)    HCT 34.6 (*)    RDW 16.0 (*)    Platelets 53 (*)    Neutrophils Relative % 39 (*)    Monocytes Relative 23 (*)     Neutro Abs 1.3 (*)    All other components within normal limits  COMPREHENSIVE  METABOLIC PANEL - Abnormal; Notable for the following:    Chloride 92 (*)    Glucose, Bld 170 (*)    Creatinine, Ser 2.44 (*)    AST 46 (*)    GFR calc non Af Amer 19 (*)    GFR calc Af Amer 22 (*)    All other components within normal limits  LIPASE, BLOOD  URINALYSIS, ROUTINE W REFLEX MICROSCOPIC   Imaging Review Dg Abd Acute W/chest  10/01/2013   CLINICAL DATA:  History lymphoma, hypertension and weakness  EXAM: ACUTE ABDOMEN SERIES (ABDOMEN 2 VIEW & CHEST 1 VIEW)  COMPARISON:  PET-CT 09/14/2013  FINDINGS: There is no evidence of dilated bowel loops or free intraperitoneal air. No radiopaque calculi or other significant radiographic abnormality is seen. Heart size and mediastinal contours are within normal limits. Both lungs are clear. Right IJ port catheter remains in good position. The tip of the catheter projects over the superior cavoatrial junction. There is a left IJ approach tunneled hemodialysis catheter. Catheter tip projects over the upper right atrium. The atherosclerotic calcifications noted in the transverse aorta. Lower lumbar degenerative disc disease. Scattered atherosclerotic vascular calcifications.  IMPRESSION: Negative abdominal radiographs.  No acute cardiopulmonary disease.   Electronically Signed   By: Malachy Moan M.D.   On: 10/01/2013 21:09    Patient's CT scan shows nonspecific inflammatory type changes of the small bowel patient's pain is dramatically improved.  She is feeling better patient is advised of this could be an evolving process and that she will need to return to the emergency department for any worsening in her condition.  Carlyle Dolly, PA-C 10/02/13 [phone]

## 2013-10-01 NOTE — ED Notes (Signed)
Bed: WA12 Expected date:  Expected time:  Means of arrival:  Comments: Hold for Triage 2 

## 2013-10-02 MED ORDER — FENTANYL CITRATE 0.05 MG/ML IJ SOLN: 100.0000 ug | Freq: Once | INTRAMUSCULAR | Status: DC

## 2013-10-02 MED ORDER — IOHEXOL 300 MG/ML  SOLN
100.0000 mL | Freq: Once | INTRAMUSCULAR | Status: AC | PRN
  Administered 2013-10-02: 100 mL via INTRAVENOUS

## 2013-10-03 NOTE — Progress Notes (Signed)
CC:   [name], M.D.  DIAGNOSIS:  Recurrent diffuse large cell non-Hodgkin lymphoma.  CURRENT THERAPY:  Status post 1 cycle of salvage therapy with Rituxan/gemcitabine/oxaliplatin.  INTERIM HISTORY:  [name] comes in for followup.  She is responding already to chemotherapy.  We are using the R-GemOx regimen.  She is on dialysis right now.  This regimen we could use and not have dialysis bother it too much.  She has already noticed that she has had nice progression of her right supraclavicular lymph nodes.  We did go ahead and get a PET scan on her.  Shockingly enough, the PET scan still only shows disease above the chest.  There is still no disease within the hilum and mediastinal nodes.  She has no disease below the diaphragm.  She tolerated first cycle of chemotherapy quite nicely.  She had very little, if any, nausea and vomiting.  There were no mouth sores.  She had no bleeding.  She had no change in bowel or bladder habits.  She is doing dialysis 3 days a week.  She has done well with dialysis.  She has had no fever.  PHYSICAL EXAMINATION:  General:  This is a well-developed, well- nourished, white female in no obvious distress.  Vital Signs: Temperature of 98.4, pulse 57, respiratory rate 18, blood pressure 126/57.  Weight is 130 pounds.  Head and Neck:  Shows a normocephalic, atraumatic skull.  There is still a slight fullness in the right preauricular region.  She has nicely improved right supraclavicular lymph nodes.  There is still a right supraclavicular lymph node.  It measures maybe about 1 cm.  It is mobile.  Lungs:  Clear bilaterally. Cardiac:  Regular rate and rhythm with a normal S1 and S2.  There are no murmurs, rubs, or bruits.  Abdomen:  Soft.  She has good bowel sounds. There is no fluid wave.  There is no palpable abdominal mass.  There is no palpable hepatosplenomegaly.  Extremities:  Show no clubbing, cyanosis or edema.  She has decent range  motion of her joints. Neurological:  Shows no focal neurological deficits.  Skin:  No rash, ecchymosis, or petechia.  LABORATORY STUDIES:  White cell count is 3, hemoglobin 11, hematocrit 35.1, platelet count was 80,000.  Sodium 144, potassium 4.7, BUN 23, creatinine 5.7.  IMPRESSION:  [name] is a very charming 72 year old white female with recurrent diffuse large cell lymphoma.  She is responding to this treatment.  We will go ahead and plan for a second cycle of chemotherapy.  I think as long as we see a response of the lymphadenopathy in the neck, we do not have to put her through any kind of scan.  We will continue to adjust her chemotherapy dose if necessary.  Her counts are a little bit on low side today, but her monocytes are on the up side so I think her bone marrow is recovering.  We will go ahead and plan to get her back to see [place] in another 2 weeks for treatment.  I will plan to see her back myself in 1 month.    ______________________________ Josph Macho, M.D. PRE/MEDQ  D:  09/21/2013  T:  10/02/2013  Job:  [id number]

## 2013-10-04 NOTE — ED Provider Notes (Signed)
Medical screening examination/treatment/procedure(s) were performed by non-physician practitioner and as supervising physician I was immediately available for consultation/collaboration.  EKG Interpretation   None        Bryton Romagnoli R. Zakaree Mcclenahan, MD 10/04/13 1043 

## 2013-10-05 ENCOUNTER — Ambulatory Visit: Payer: Medicare Other | Admitting: Hematology & Oncology

## 2013-10-05 ENCOUNTER — Other Ambulatory Visit (HOSPITAL_BASED_OUTPATIENT_CLINIC_OR_DEPARTMENT_OTHER): Payer: Medicare Other | Admitting: Lab

## 2013-10-05 ENCOUNTER — Ambulatory Visit (HOSPITAL_BASED_OUTPATIENT_CLINIC_OR_DEPARTMENT_OTHER): Payer: Medicare Other

## 2013-10-05 ENCOUNTER — Other Ambulatory Visit: Payer: Self-pay | Admitting: *Deleted

## 2013-10-05 VITALS — BP 139/51 | HR 80 | Temp 98.6°F | Resp 16

## 2013-10-05 DIAGNOSIS — C8589 Other specified types of non-Hodgkin lymphoma, extranodal and solid organ sites: Secondary | ICD-10-CM

## 2013-10-05 DIAGNOSIS — C8330 Diffuse large B-cell lymphoma, unspecified site: Secondary | ICD-10-CM

## 2013-10-05 DIAGNOSIS — C801 Malignant (primary) neoplasm, unspecified: Secondary | ICD-10-CM

## 2013-10-05 DIAGNOSIS — Z5111 Encounter for antineoplastic chemotherapy: Secondary | ICD-10-CM

## 2013-10-05 LAB — CMP (CANCER CENTER ONLY)
ALT(SGPT): 24 U/L (ref 10–47)
Albumin: 3.3 g/dL (ref 3.3–5.5)
CO2: 35 mEq/L — ABNORMAL HIGH (ref 18–33)
Chloride: 92 mEq/L — ABNORMAL LOW (ref 98–108)
Glucose, Bld: 131 mg/dL — ABNORMAL HIGH (ref 73–118)
Potassium: 3.5 mEq/L (ref 3.3–4.7)
Sodium: 139 mEq/L (ref 128–145)
Total Bilirubin: 0.5 mg/dl (ref 0.20–1.60)
Total Protein: 6.4 g/dL (ref 6.4–8.1)

## 2013-10-05 LAB — CBC WITH DIFFERENTIAL (CANCER CENTER ONLY)
BASO%: 2.3 % — ABNORMAL HIGH (ref 0.0–2.0)
EOS%: 2 % (ref 0.0–7.0)
Eosinophils Absolute: 0.1 10*3/uL (ref 0.0–0.5)
LYMPH#: 1.3 10*3/uL (ref 0.9–3.3)
LYMPH%: 35.8 % (ref 14.0–48.0)
MCHC: 31 g/dL — ABNORMAL LOW (ref 32.0–36.0)
MONO#: 0.9 10*3/uL (ref 0.1–0.9)
NEUT#: 1.2 10*3/uL — ABNORMAL LOW (ref 1.5–6.5)
Platelets: 101 10*3/uL — ABNORMAL LOW (ref 145–400)
RBC: 3.08 10*6/uL — ABNORMAL LOW (ref 3.70–5.32)
RDW: 16.4 % — ABNORMAL HIGH (ref 11.1–15.7)
WBC: 3.6 10*3/uL — ABNORMAL LOW (ref 3.9–10.0)

## 2013-10-05 LAB — TECHNOLOGIST REVIEW CHCC SATELLITE

## 2013-10-05 MED ORDER — ONDANSETRON 8 MG/50ML IVPB (CHCC)
8.0000 mg | Freq: Once | INTRAVENOUS | Status: AC
  Administered 2013-10-05: 8 mg via INTRAVENOUS

## 2013-10-05 MED ORDER — DEXAMETHASONE SODIUM PHOSPHATE 10 MG/ML IJ SOLN
INTRAMUSCULAR | Status: AC
  Filled 2013-10-05: qty 1

## 2013-10-05 MED ORDER — HEPARIN SOD (PORK) LOCK FLUSH 100 UNIT/ML IV SOLN
500.0000 [IU] | Freq: Once | INTRAVENOUS | Status: AC | PRN
  Administered 2013-10-05: 500 [IU]
  Filled 2013-10-05: qty 5

## 2013-10-05 MED ORDER — DEXAMETHASONE SODIUM PHOSPHATE 10 MG/ML IJ SOLN
10.0000 mg | Freq: Once | INTRAMUSCULAR | Status: AC
  Administered 2013-10-05: 10 mg via INTRAVENOUS

## 2013-10-05 MED ORDER — SODIUM CHLORIDE 0.9 % IV SOLN
750.0000 mg/m2 | Freq: Once | INTRAVENOUS | Status: AC
  Administered 2013-10-05: 1216 mg via INTRAVENOUS
  Filled 2013-10-05: qty 31.98

## 2013-10-05 MED ORDER — SODIUM CHLORIDE 0.9 % IV SOLN
375.0000 mg/m2 | Freq: Once | INTRAVENOUS | Status: AC
  Administered 2013-10-05: 600 mg via INTRAVENOUS
  Filled 2013-10-05: qty 60

## 2013-10-05 MED ORDER — SODIUM CHLORIDE 0.9 % IJ SOLN
10.0000 mL | INTRAMUSCULAR | Status: DC | PRN
  Administered 2013-10-05: 10 mL
  Filled 2013-10-05: qty 10

## 2013-10-05 MED ORDER — ACETAMINOPHEN 325 MG PO TABS
ORAL_TABLET | ORAL | Status: AC
  Filled 2013-10-05: qty 2

## 2013-10-05 MED ORDER — DEXTROSE 5 % IV SOLN
Freq: Once | INTRAVENOUS | Status: AC
  Administered 2013-10-05: 14:00:00 via INTRAVENOUS

## 2013-10-05 MED ORDER — FENTANYL 12 MCG/HR TD PT72: 12.0000 ug | MEDICATED_PATCH | TRANSDERMAL | Status: DC

## 2013-10-05 MED ORDER — DIPHENHYDRAMINE HCL 25 MG PO CAPS
ORAL_CAPSULE | ORAL | Status: AC
  Filled 2013-10-05: qty 2

## 2013-10-05 MED ORDER — OXALIPLATIN CHEMO INJECTION 100 MG/20ML
60.0000 mg/m2 | Freq: Once | INTRAVENOUS | Status: AC
  Administered 2013-10-05: 95 mg via INTRAVENOUS
  Filled 2013-10-05: qty 19

## 2013-10-05 MED ORDER — DIPHENHYDRAMINE HCL 25 MG PO CAPS
50.0000 mg | ORAL_CAPSULE | Freq: Once | ORAL | Status: AC
  Administered 2013-10-05: 50 mg via ORAL

## 2013-10-05 MED ORDER — ACETAMINOPHEN 325 MG PO TABS
650.0000 mg | ORAL_TABLET | Freq: Once | ORAL | Status: AC
  Administered 2013-10-05: 650 mg via ORAL

## 2013-10-05 NOTE — Progress Notes (Signed)
CC:   [name], M.D. Mindi Slicker. Lowell Guitar, M.D.  DIAGNOSES: 1. Recurrent diffuse large cell non-Hodgkin lymphoma. 2. Renal failure--on dialysis. 3. 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  CURRENT THERAPY:  Observation.  INTERIM HISTORY:  Ms. Cisse comes in for followup. She now is on dialysis.  She has had 10 treatments so far.  She still feels very tired.  She has fatigue.  She has noticed progressive swelling above her right eye again.  This was treated previously with radiation because of recurrence in that area.  She is having some difficulty with vision with the right eye.  She had radiation therapy to the right pre-auricular area.  This resolved the recurrence in that area.  She has had no cough.  Appetite is down.  We are going to put on some Megace.  Hopefully the insurance will cover this.  She is very nervous and anxious.  She is not sleeping well.  As such, I gave her a prescription for Xanax to take.  Hopefully, this will help her out.  Her overall performance status is ECOG 2-3.  She has had no nausea or vomiting.  She has had no diarrhea.  The constipation has been a long-term problem for her.  She has had no rash. Her skin is on the dry side.  PHYSICAL EXAMINATION:  General:  This is a well-developed, well- nourished, white female in no obvious distress.  Vital signs: Temperature of 98.6, pulse 67, respiratory rate 14, blood pressure 160/69, weight is 131 pounds.  Head and neck:  Normocephalic, atraumatic skull.  There are no ocular or oral lesions.  She does have a swelling above the right eye along the supraorbital ridge.  This is slightly tender to palpation.  Her pupils react appropriately.  She has good extraocular muscle movement.  There are no oral lesions.  Neck:  Supple with no adenopathy.  Lungs:  Clear bilaterally; may be some slight decrease at the bases.  Cardiac:  Regular rate and rhythm with a normal S1 and S2.  She may have an  occasional extra beat.  She has a 1/6 systolic ejection murmur.  Abdomen:  Soft.  She has good bowel sounds. There is no fluid wave.  There is no palpable hepatosplenomegaly. Extremities:  Show no clubbing, cyanosis or edema.  She has 4/5 strength in her legs.  She has decent joint mobility.  Skin:  Shows some dry skin.  There are no rashes.  Neurological:  Shows no focal neurological deficits.  LABORATORY STUDIES:  White cell count is 5.1, hemoglobin 11.4, hematocrit 34.8, platelet count 88,000.  Sodium 137, potassium 4.3, BUN 11, creatinine 3.4.  Total protein 6.6 with an albumin of 3.4.  IMPRESSION:  [name] is a 72 year old white female with recurrent non-Hodgkin lymphoma.  Again, I suspect that she may have another recurrence.  This one is going to be very difficult to treat.  She has already had radiation to this area above the right eye.  I do not know if she can take any more radiation.  I will call to see if she can.  I just wish she would feel better.  I thought dialysis will make her feel better.  I have noted that her platelet count is down.  We are going to have to be very cautious with this.  I looked at her blood smear, I did not see anything that was suspicious with her blood.  I just have a sense that Ms. Dresden is  just going to decline gradually. I really thought that we could help her with the dialysis.  I want to see her back in another couple of weeks or so.    ______________________________ Josph Macho, M.D. PRE/MEDQ  D:  07/14/2013  T:  10/04/2013  Job:  [id number]

## 2013-10-05 NOTE — Patient Instructions (Signed)
[facility] Discharge Instructions for Patients Receiving Chemotherapy  Today you received the following chemotherapy agents Oxaliplatin, Gemzar, Rituxan  To help prevent nausea and vomiting after your treatment, we encourage you to take your nausea medication    If you develop nausea and vomiting that is not controlled by your nausea medication, call the clinic.   BELOW ARE SYMPTOMS THAT SHOULD BE REPORTED IMMEDIATELY:  *FEVER GREATER THAN 100.5 F  *CHILLS WITH OR WITHOUT FEVER  NAUSEA AND VOMITING THAT IS NOT CONTROLLED WITH YOUR NAUSEA MEDICATION  *UNUSUAL SHORTNESS OF BREATH  *UNUSUAL BRUISING OR BLEEDING  TENDERNESS IN MOUTH AND THROAT WITH OR WITHOUT PRESENCE OF ULCERS  *URINARY PROBLEMS  *BOWEL PROBLEMS  UNUSUAL RASH Items with * indicate a potential emergency and should be followed up as soon as possible.  Feel free to call the clinic you have any questions or concerns. The clinic phone number is [phone].

## 2013-10-06 ENCOUNTER — Telehealth: Payer: Self-pay | Admitting: Oncology

## 2013-10-06 ENCOUNTER — Telehealth: Payer: Self-pay | Admitting: Hematology & Oncology

## 2013-10-06 NOTE — Telephone Encounter (Signed)
[name] called and was asking for a refill on her oxyCODONE-acetaminophen (ROXICET) 5-325 MG per tablet.  She last had it filled on 08/11/13.  Will forward to Dr. Roselind Messier.

## 2013-10-06 NOTE — Telephone Encounter (Signed)
CT was precerted [id number]. Pt and Dr. Myna Hidalgo aware CT will be at 2 pm 10-07-13

## 2013-10-07 ENCOUNTER — Ambulatory Visit (HOSPITAL_BASED_OUTPATIENT_CLINIC_OR_DEPARTMENT_OTHER)
Admission: RE | Admit: 2013-10-07 | Discharge: 2013-10-07 | Disposition: A | Discharge status: Home / Self Care | Payer: Medicare Other | Source: Ambulatory Visit | Attending: Hematology & Oncology | Admitting: Hematology & Oncology

## 2013-10-07 ENCOUNTER — Other Ambulatory Visit: Payer: Self-pay | Admitting: Radiation Oncology

## 2013-10-07 DIAGNOSIS — R220 Localized swelling, mass and lump, head: Secondary | ICD-10-CM | POA: Insufficient documentation

## 2013-10-07 DIAGNOSIS — C8330 Diffuse large B-cell lymphoma, unspecified site: Secondary | ICD-10-CM

## 2013-10-07 MED ORDER — OXYCODONE-ACETAMINOPHEN 5-325 MG PO TABS: 1.0000 | ORAL_TABLET | ORAL | Status: DC | PRN

## 2013-10-09 NOTE — Telephone Encounter (Signed)
error 

## 2013-10-13 ENCOUNTER — Telehealth: Payer: Self-pay | Admitting: Oncology

## 2013-10-13 NOTE — Telephone Encounter (Signed)
Called [name] to see if she would like to come in on Friday for a reconsult with Dr. Roselind Messier.  Per Leida Lauth, Dr. Myna Hidalgo thinks the areas have gone down and is going to continue chemotherapy.  He does not think she needs radiation.

## 2013-10-19 ENCOUNTER — Other Ambulatory Visit (HOSPITAL_BASED_OUTPATIENT_CLINIC_OR_DEPARTMENT_OTHER): Payer: Medicare Other | Admitting: Lab

## 2013-10-19 ENCOUNTER — Ambulatory Visit: Payer: Medicare Other | Admitting: Hematology & Oncology

## 2013-10-19 ENCOUNTER — Ambulatory Visit (HOSPITAL_BASED_OUTPATIENT_CLINIC_OR_DEPARTMENT_OTHER): Payer: Medicare Other

## 2013-10-19 VITALS — BP 144/55 | HR 85 | Temp 98.2°F | Resp 18

## 2013-10-19 DIAGNOSIS — C8589 Other specified types of non-Hodgkin lymphoma, extranodal and solid organ sites: Secondary | ICD-10-CM

## 2013-10-19 DIAGNOSIS — C8330 Diffuse large B-cell lymphoma, unspecified site: Secondary | ICD-10-CM

## 2013-10-19 DIAGNOSIS — Z5111 Encounter for antineoplastic chemotherapy: Secondary | ICD-10-CM

## 2013-10-19 DIAGNOSIS — Z5112 Encounter for antineoplastic immunotherapy: Secondary | ICD-10-CM

## 2013-10-19 DIAGNOSIS — C801 Malignant (primary) neoplasm, unspecified: Secondary | ICD-10-CM

## 2013-10-19 LAB — CBC WITH DIFFERENTIAL (CANCER CENTER ONLY)
BASO%: 1.8 % (ref 0.0–2.0)
EOS%: 1.8 % (ref 0.0–7.0)
HCT: 32.3 % — ABNORMAL LOW (ref 34.8–46.6)
LYMPH#: 1.6 10*3/uL (ref 0.9–3.3)
LYMPH%: 42 % (ref 14.0–48.0)
MCHC: 31 g/dL — ABNORMAL LOW (ref 32.0–36.0)
MCV: 106 fL — ABNORMAL HIGH (ref 81–101)
MONO#: 1.1 10*3/uL — ABNORMAL HIGH (ref 0.1–0.9)
NEUT%: 25.1 % — ABNORMAL LOW (ref 39.6–80.0)
Platelets: 106 10*3/uL — ABNORMAL LOW (ref 145–400)
RDW: 18.1 % — ABNORMAL HIGH (ref 11.1–15.7)

## 2013-10-19 LAB — CMP (CANCER CENTER ONLY)
AST: 27 U/L (ref 11–38)
Alkaline Phosphatase: 62 U/L (ref 26–84)
BUN, Bld: 17 mg/dL (ref 7–22)
Calcium: 9.6 mg/dL (ref 8.0–10.3)
Creat: 5.2 mg/dl (ref 0.6–1.2)

## 2013-10-19 LAB — LACTATE DEHYDROGENASE: LDH: 298 U/L — ABNORMAL HIGH (ref 94–250)

## 2013-10-19 MED ORDER — DEXTROSE 5 % IV SOLN
Freq: Once | INTRAVENOUS | Status: AC
  Administered 2013-10-19: 13:00:00 via INTRAVENOUS

## 2013-10-19 MED ORDER — SODIUM CHLORIDE 0.9 % IV SOLN
375.0000 mg/m2 | Freq: Once | INTRAVENOUS | Status: AC
  Administered 2013-10-19: 600 mg via INTRAVENOUS
  Filled 2013-10-19: qty 60

## 2013-10-19 MED ORDER — SODIUM CHLORIDE 0.9 % IV SOLN
Freq: Once | INTRAVENOUS | Status: AC
  Administered 2013-10-19: 09:00:00 via INTRAVENOUS

## 2013-10-19 MED ORDER — DEXTROSE 5 % IV SOLN
60.0000 mg/m2 | Freq: Once | INTRAVENOUS | Status: AC
  Administered 2013-10-19: 95 mg via INTRAVENOUS
  Filled 2013-10-19: qty 19

## 2013-10-19 MED ORDER — ONDANSETRON 8 MG/50ML IVPB (CHCC)
8.0000 mg | Freq: Once | INTRAVENOUS | Status: AC
  Administered 2013-10-19: 8 mg via INTRAVENOUS

## 2013-10-19 MED ORDER — DIPHENHYDRAMINE HCL 25 MG PO CAPS
ORAL_CAPSULE | ORAL | Status: AC
  Filled 2013-10-19: qty 2

## 2013-10-19 MED ORDER — DIPHENHYDRAMINE HCL 25 MG PO CAPS
50.0000 mg | ORAL_CAPSULE | Freq: Once | ORAL | Status: AC
  Administered 2013-10-19: 50 mg via ORAL

## 2013-10-19 MED ORDER — SODIUM CHLORIDE 0.9 % IJ SOLN
10.0000 mL | INTRAMUSCULAR | Status: DC | PRN
  Administered 2013-10-19: 10 mL
  Filled 2013-10-19: qty 10

## 2013-10-19 MED ORDER — DEXAMETHASONE SODIUM PHOSPHATE 10 MG/ML IJ SOLN
10.0000 mg | Freq: Once | INTRAMUSCULAR | Status: AC
  Administered 2013-10-19: 10 mg via INTRAVENOUS

## 2013-10-19 MED ORDER — ACETAMINOPHEN 325 MG PO TABS
ORAL_TABLET | ORAL | Status: AC
  Filled 2013-10-19: qty 2

## 2013-10-19 MED ORDER — SODIUM CHLORIDE 0.9 % IV SOLN
750.0000 mg/m2 | Freq: Once | INTRAVENOUS | Status: AC
  Administered 2013-10-19: 1216 mg via INTRAVENOUS
  Filled 2013-10-19: qty 31.98

## 2013-10-19 MED ORDER — DEXAMETHASONE SODIUM PHOSPHATE 10 MG/ML IJ SOLN
INTRAMUSCULAR | Status: AC
  Filled 2013-10-19: qty 1

## 2013-10-19 MED ORDER — HEPARIN SOD (PORK) LOCK FLUSH 100 UNIT/ML IV SOLN
500.0000 [IU] | Freq: Once | INTRAVENOUS | Status: AC | PRN
  Administered 2013-10-19: 500 [IU]
  Filled 2013-10-19: qty 5

## 2013-10-19 MED ORDER — ACETAMINOPHEN 325 MG PO TABS
650.0000 mg | ORAL_TABLET | Freq: Once | ORAL | Status: AC
  Administered 2013-10-19: 650 mg via ORAL

## 2013-10-19 NOTE — Patient Instructions (Signed)
[facility] Discharge Instructions for Patients Receiving Chemotherapy  Today you received the following chemotherapy agents Oxaliplatin, Gemzar, Rituxan  To help prevent nausea and vomiting after your treatment, we encourage you to take your nausea medication    If you develop nausea and vomiting that is not controlled by your nausea medication, call the clinic.   BELOW ARE SYMPTOMS THAT SHOULD BE REPORTED IMMEDIATELY:  *FEVER GREATER THAN 100.5 F  *CHILLS WITH OR WITHOUT FEVER  NAUSEA AND VOMITING THAT IS NOT CONTROLLED WITH YOUR NAUSEA MEDICATION  *UNUSUAL SHORTNESS OF BREATH  *UNUSUAL BRUISING OR BLEEDING  TENDERNESS IN MOUTH AND THROAT WITH OR WITHOUT PRESENCE OF ULCERS  *URINARY PROBLEMS  *BOWEL PROBLEMS  UNUSUAL RASH Items with * indicate a potential emergency and should be followed up as soon as possible.  Feel free to call the clinic you have any questions or concerns. The clinic phone number is [phone].    

## 2013-10-20 NOTE — Progress Notes (Signed)
DIAGNOSIS:  Recurrent diffuse large cell non-Hodgkin lymphoma.  CURRENT THERAPY:  The patient is status post 3 cycles of gemcitabine/oxaliplatin/Rituxan.  INTERIM HISTORY:  [name] comes in for followup.  She is in for 4 cycles of chemotherapy.  She actually looks the best that I have seen looking in a long time.  The lymphadenopathy in the right supraclavicular region has pretty much resolved.  She has intermittent swelling in the right preparotid region.  Of note, this is secondary to blocked salivary gland duct.  We had gotten a CT scan on her previously.  This was on 12/10.  This did show enlarged right parotid mass measuring 3.6 x 2.4 cm.  The swelling previously had resolved on its own.  It now seems to have come back a little bit.  She is doing dialysis.  She is tolerating dialysis fairly well.  She has gained a little weight.  Her appetite is doing little bit better.  Overall, I would say that her performance status currently is ECOG 1-2.  PHYSICAL EXAMINATION:  General:  This is a fairly well-developed, well- nourished white female, in no obvious distress.  Vital Signs: Temperature of 97.9, pulse 84, respiratory rate 18, blood pressure 143/61, weight is 132.  Head and Neck:  Mild swelling in the right preparotid region.  This is slightly firm.  It is nontender.  She has good extraocular muscle movement.  There is no ptosis of the right eye. There is no right supraorbital swelling.  She has no mucositis.  I cannot detect any adenopathy in her neck.  Lungs:  Clear.  Cardiac: Regular rate and rhythm with a normal S1 and S2.  There are no murmurs, rubs, or bruits.  Abdomen:  Soft.  She has good bowel sounds.  There is no palpable abdominal mass.  There is no palpable hepatosplenomegaly. Extremities:  Show no clubbing, cyanosis, or edema.  She has decent strength in her extremities.  Skin:  Slight dry.  She has no ecchymosis or petechia.  Neurological:  No focal  neurological deficits.  LABORATORY STUDIES:  White cell count is 3.8, hemoglobin 10, hematocrit 32.3, platelet count 106.  BUN is 17, creatinine 5.2.  Calcium is 9.6.  IMPRESSION AND PLAN:  [name] is a very nice 72 year old white female with relapsed diffuse large cell lymphoma.  She is on salvage therapy.  To me, it seems to be responding.  Again, this right parotid swelling hopefully is more of a blocked salivary gland duct than actual tumor/lymphoma growth.  We will go ahead and plan for a followup PET scan.  This I think will certainly give [place] a good idea as to how well we are doing.  I think if this area in the right parotid region is hypermetabolic, then we might be able to consider radiation therapy to this region.  I know she has had some radiation there previously.  She may be able to have additional radiation.  I will see her back in when her scan is done in a week or so.  I will have her come back to see [place] in another 2-3 weeks.  I think if we see that she has continued response, then we would continue her on this protocol.  She is tolerating it very well.  Her quality of life is better and her performance status seems be little bit improved.    ______________________________ Josph Macho, M.D. PRE/MEDQ  D:  10/19/2013  T:  10/20/2013  Job:  [id number]

## 2013-10-22 ENCOUNTER — Other Ambulatory Visit: Payer: Self-pay | Admitting: Hematology & Oncology

## 2013-11-02 ENCOUNTER — Ambulatory Visit (HOSPITAL_COMMUNITY): Admission: RE | Admit: 2013-11-02 | Payer: Medicare Other | Source: Ambulatory Visit

## 2013-11-03 ENCOUNTER — Observation Stay (HOSPITAL_COMMUNITY)
Admission: EM | Admit: 2013-11-03 | Discharge: 2013-11-04 | Disposition: A | Discharge status: Home / Self Care | Payer: Medicare Other | Attending: Internal Medicine | Admitting: Internal Medicine

## 2013-11-03 ENCOUNTER — Encounter (HOSPITAL_COMMUNITY): Payer: Self-pay | Admitting: Emergency Medicine

## 2013-11-03 ENCOUNTER — Emergency Department (HOSPITAL_COMMUNITY): Payer: Medicare Other

## 2013-11-03 DIAGNOSIS — Z95818 Presence of other cardiac implants and grafts: Secondary | ICD-10-CM | POA: Insufficient documentation

## 2013-11-03 DIAGNOSIS — R5381 Other malaise: Secondary | ICD-10-CM | POA: Insufficient documentation

## 2013-11-03 DIAGNOSIS — N189 Chronic kidney disease, unspecified: Secondary | ICD-10-CM

## 2013-11-03 DIAGNOSIS — Z923 Personal history of irradiation: Secondary | ICD-10-CM | POA: Insufficient documentation

## 2013-11-03 DIAGNOSIS — Z992 Dependence on renal dialysis: Secondary | ICD-10-CM | POA: Insufficient documentation

## 2013-11-03 DIAGNOSIS — I120 Hypertensive chronic kidney disease with stage 5 chronic kidney disease or end stage renal disease: Secondary | ICD-10-CM | POA: Insufficient documentation

## 2013-11-03 DIAGNOSIS — I119 Hypertensive heart disease without heart failure: Secondary | ICD-10-CM | POA: Insufficient documentation

## 2013-11-03 DIAGNOSIS — Z888 Allergy status to other drugs, medicaments and biological substances status: Secondary | ICD-10-CM | POA: Insufficient documentation

## 2013-11-03 DIAGNOSIS — N186 End stage renal disease: Secondary | ICD-10-CM

## 2013-11-03 DIAGNOSIS — C8330 Diffuse large B-cell lymphoma, unspecified site: Secondary | ICD-10-CM

## 2013-11-03 DIAGNOSIS — R5383 Other fatigue: Secondary | ICD-10-CM

## 2013-11-03 DIAGNOSIS — C8589 Other specified types of non-Hodgkin lymphoma, extranodal and solid organ sites: Secondary | ICD-10-CM | POA: Insufficient documentation

## 2013-11-03 DIAGNOSIS — E1110 Type 2 diabetes mellitus with ketoacidosis without coma: Secondary | ICD-10-CM | POA: Insufficient documentation

## 2013-11-03 DIAGNOSIS — D631 Anemia in chronic kidney disease: Secondary | ICD-10-CM | POA: Insufficient documentation

## 2013-11-03 DIAGNOSIS — I4891 Unspecified atrial fibrillation: Secondary | ICD-10-CM | POA: Insufficient documentation

## 2013-11-03 DIAGNOSIS — Z87891 Personal history of nicotine dependence: Secondary | ICD-10-CM | POA: Insufficient documentation

## 2013-11-03 DIAGNOSIS — N039 Chronic nephritic syndrome with unspecified morphologic changes: Secondary | ICD-10-CM

## 2013-11-03 DIAGNOSIS — E119 Type 2 diabetes mellitus without complications: Secondary | ICD-10-CM | POA: Diagnosis present

## 2013-11-03 DIAGNOSIS — F40298 Other specified phobia: Secondary | ICD-10-CM | POA: Insufficient documentation

## 2013-11-03 DIAGNOSIS — I2510 Atherosclerotic heart disease of native coronary artery without angina pectoris: Principal | ICD-10-CM | POA: Insufficient documentation

## 2013-11-03 DIAGNOSIS — I480 Paroxysmal atrial fibrillation: Secondary | ICD-10-CM | POA: Insufficient documentation

## 2013-11-03 DIAGNOSIS — F410 Panic disorder [episodic paroxysmal anxiety] without agoraphobia: Secondary | ICD-10-CM | POA: Insufficient documentation

## 2013-11-03 DIAGNOSIS — R002 Palpitations: Secondary | ICD-10-CM | POA: Insufficient documentation

## 2013-11-03 HISTORY — DX: Pure hypercholesterolemia, unspecified: E78.00

## 2013-11-03 HISTORY — DX: Dependence on renal dialysis: Z99.2

## 2013-11-03 HISTORY — DX: End stage renal disease: N18.6

## 2013-11-03 HISTORY — DX: Atherosclerotic heart disease of native coronary artery without angina pectoris: I25.10

## 2013-11-03 HISTORY — DX: Diffuse large B-cell lymphoma, unspecified site: C83.30

## 2013-11-03 HISTORY — DX: Type 2 diabetes mellitus without complications: E11.9

## 2013-11-03 HISTORY — DX: Unspecified atrial fibrillation: I48.91

## 2013-11-03 LAB — TROPONIN I: Troponin I: <0.3 ng/mL (ref <0.30)

## 2013-11-03 LAB — POCT I-STAT, CHEM 8
BUN: 5 mg/dL — ABNORMAL LOW (ref 6–23)
CREATININE: 2.3 mg/dL — AB (ref 0.50–1.10)
Calcium, Ion: 1.03 mmol/L — ABNORMAL LOW (ref 1.13–1.30)
Chloride: 96 mEq/L (ref 96–112)
Glucose, Bld: 187 mg/dL — ABNORMAL HIGH (ref 70–99)
HCT: 29 % — ABNORMAL LOW (ref 36.0–46.0)
HEMOGLOBIN: 9.9 g/dL — AB (ref 12.0–15.0)
POTASSIUM: 3 meq/L — AB (ref 3.7–5.3)
SODIUM: 138 meq/L (ref 137–147)
TCO2: 31 mmol/L (ref 0–100)

## 2013-11-03 LAB — HEPATIC FUNCTION PANEL
ALBUMIN: 2.5 g/dL — AB (ref 3.5–5.2)
ALK PHOS: 63 U/L (ref 39–117)
ALT: 10 U/L (ref 0–35)
AST: 19 U/L (ref 0–37)
BILIRUBIN TOTAL: 0.3 mg/dL (ref 0.3–1.2)
Bilirubin, Direct: <0.2 mg/dL (ref 0.0–0.3)
TOTAL PROTEIN: 5.7 g/dL — AB (ref 6.0–8.3)

## 2013-11-03 LAB — CBC WITH DIFFERENTIAL/PLATELET
BASOS ABS: 0 10*3/uL (ref 0.0–0.1)
Basophils Relative: 0 % (ref 0–1)
EOS PCT: 1 % (ref 0–5)
Eosinophils Absolute: 0.1 10*3/uL (ref 0.0–0.7)
HCT: 27.2 % — ABNORMAL LOW (ref 36.0–46.0)
Hemoglobin: 8.7 g/dL — ABNORMAL LOW (ref 12.0–15.0)
LYMPHS ABS: 1.2 10*3/uL (ref 0.7–4.0)
Lymphocytes Relative: 29 % (ref 12–46)
MCH: 32.6 pg (ref 26.0–34.0)
MCHC: 32 g/dL (ref 30.0–36.0)
MCV: 101.9 fL — AB (ref 78.0–100.0)
Monocytes Absolute: 1.1 10*3/uL — ABNORMAL HIGH (ref 0.1–1.0)
Monocytes Relative: 26 % — ABNORMAL HIGH (ref 3–12)
Neutro Abs: 1.8 10*3/uL (ref 1.7–7.7)
Neutrophils Relative %: 43 % (ref 43–77)
Platelets: 192 10*3/uL (ref 150–400)
RBC: 2.67 MIL/uL — AB (ref 3.87–5.11)
RDW: 18.9 % — ABNORMAL HIGH (ref 11.5–15.5)
WBC: 4.2 10*3/uL (ref 4.0–10.5)

## 2013-11-03 LAB — CBC
HEMATOCRIT: 23.3 % — AB (ref 36.0–46.0)
HEMOGLOBIN: 7.4 g/dL — AB (ref 12.0–15.0)
MCH: 32.7 pg (ref 26.0–34.0)
MCHC: 31.8 g/dL (ref 30.0–36.0)
MCV: 103.1 fL — ABNORMAL HIGH (ref 78.0–100.0)
Platelets: 170 10*3/uL (ref 150–400)
RBC: 2.26 MIL/uL — ABNORMAL LOW (ref 3.87–5.11)
RDW: 19.1 % — ABNORMAL HIGH (ref 11.5–15.5)
WBC: 3.7 10*3/uL — AB (ref 4.0–10.5)

## 2013-11-03 LAB — MAGNESIUM: Magnesium: 1.9 mg/dL (ref 1.5–2.5)

## 2013-11-03 LAB — POCT I-STAT TROPONIN I: Troponin i, poc: 0.04 ng/mL (ref 0.00–0.08)

## 2013-11-03 LAB — PRO B NATRIURETIC PEPTIDE: PRO B NATRI PEPTIDE: 6465 pg/mL — AB (ref 0–125)

## 2013-11-03 MED ORDER — ALPRAZOLAM 0.5 MG PO TABS: 0.5000 mg | ORAL_TABLET | Freq: Every evening | ORAL | Status: DC | PRN

## 2013-11-03 MED ORDER — PANTOPRAZOLE SODIUM 40 MG PO TBEC
40.0000 mg | DELAYED_RELEASE_TABLET | Freq: Two times a day (BID) | ORAL | Status: DC
Start: 2013-11-04 — End: 2013-11-04
  Administered 2013-11-04: 40 mg via ORAL
  Filled 2013-11-03 (×2): qty 1

## 2013-11-03 MED ORDER — ACETAMINOPHEN 650 MG RE SUPP: 650.0000 mg | Freq: Four times a day (QID) | RECTAL | Status: DC | PRN

## 2013-11-03 MED ORDER — METOPROLOL TARTRATE 25 MG PO TABS
25.0000 mg | ORAL_TABLET | Freq: Two times a day (BID) | ORAL | Status: DC
  Administered 2013-11-03 – 2013-11-04 (×2): 25 mg via ORAL
  Filled 2013-11-03 (×3): qty 1

## 2013-11-03 MED ORDER — LORATADINE 10 MG PO TABS
10.0000 mg | ORAL_TABLET | Freq: Every day | ORAL | Status: DC | PRN
Start: 2013-11-03 — End: 2013-11-04
  Filled 2013-11-03: qty 1

## 2013-11-03 MED ORDER — ONDANSETRON HCL 4 MG/2ML IJ SOLN: 4.0000 mg | Freq: Four times a day (QID) | INTRAMUSCULAR | Status: DC | PRN

## 2013-11-03 MED ORDER — SODIUM CHLORIDE 0.9 % IJ SOLN: 3.0000 mL | Freq: Two times a day (BID) | INTRAMUSCULAR | Status: DC

## 2013-11-03 MED ORDER — DILTIAZEM LOAD VIA INFUSION
10.0000 mg | Freq: Once | INTRAVENOUS | Status: AC
  Administered 2013-11-03: 10 mg via INTRAVENOUS
  Filled 2013-11-03: qty 10

## 2013-11-03 MED ORDER — FENTANYL 12 MCG/HR TD PT72
12.0000 ug | MEDICATED_PATCH | TRANSDERMAL | Status: DC
  Administered 2013-11-03: 12.5 ug via TRANSDERMAL
  Filled 2013-11-03: qty 1

## 2013-11-03 MED ORDER — WARFARIN SODIUM 5 MG PO TABS
5.0000 mg | ORAL_TABLET | ORAL | Status: AC
  Administered 2013-11-04: 5 mg via ORAL
  Filled 2013-11-03: qty 1

## 2013-11-03 MED ORDER — SEVELAMER CARBONATE 800 MG PO TABS
1600.0000 mg | ORAL_TABLET | Freq: Three times a day (TID) | ORAL | Status: DC
  Administered 2013-11-04 (×3): 1600 mg via ORAL
  Filled 2013-11-03 (×4): qty 2

## 2013-11-03 MED ORDER — VALACYCLOVIR HCL 500 MG PO TABS
1000.0000 mg | ORAL_TABLET | Freq: Every day | ORAL | Status: DC
  Filled 2013-11-03: qty 2

## 2013-11-03 MED ORDER — ONDANSETRON HCL 4 MG PO TABS: 4.0000 mg | ORAL_TABLET | Freq: Four times a day (QID) | ORAL | Status: DC | PRN

## 2013-11-03 MED ORDER — WARFARIN - PHARMACIST DOSING INPATIENT: Freq: Every day | Status: DC

## 2013-11-03 MED ORDER — RENA-VITE PO TABS
1.0000 | ORAL_TABLET | Freq: Every day | ORAL | Status: DC
  Administered 2013-11-03: 1 via ORAL
  Filled 2013-11-03 (×2): qty 1

## 2013-11-03 MED ORDER — ACETAMINOPHEN 325 MG PO TABS: 650.0000 mg | ORAL_TABLET | Freq: Four times a day (QID) | ORAL | Status: DC | PRN

## 2013-11-03 MED ORDER — DILTIAZEM HCL 100 MG IV SOLR
5.0000 mg/h | INTRAVENOUS | Status: DC
  Administered 2013-11-03 – 2013-11-04 (×2): 10 mg/h via INTRAVENOUS
  Filled 2013-11-03 (×2): qty 100

## 2013-11-03 MED ORDER — LEVALBUTEROL HCL 0.63 MG/3ML IN NEBU: 0.6300 mg | INHALATION_SOLUTION | Freq: Four times a day (QID) | RESPIRATORY_TRACT | Status: DC | PRN

## 2013-11-03 MED ORDER — OXYCODONE-ACETAMINOPHEN 5-325 MG PO TABS: 1.0000 | ORAL_TABLET | ORAL | Status: DC | PRN

## 2013-11-03 MED ORDER — ADENOSINE 6 MG/2ML IV SOLN
12.0000 mg | Freq: Once | INTRAVENOUS | Status: AC
  Administered 2013-11-03: 12 mg via INTRAVENOUS

## 2013-11-03 MED ORDER — PANTOPRAZOLE SODIUM 40 MG PO TBEC
40.0000 mg | DELAYED_RELEASE_TABLET | Freq: Every day | ORAL | Status: DC
  Administered 2013-11-03: 40 mg via ORAL

## 2013-11-03 MED ORDER — WARFARIN VIDEO: Freq: Once | Status: DC

## 2013-11-03 MED ORDER — DILTIAZEM HCL 25 MG/5ML IV SOLN: 10.0000 mg | Freq: Once | INTRAVENOUS | Status: DC

## 2013-11-03 MED ORDER — ADENOSINE 6 MG/2ML IV SOLN
6.0000 mg | Freq: Once | INTRAVENOUS | Status: AC
  Administered 2013-11-03: 6 mg via INTRAVENOUS

## 2013-11-03 MED ORDER — TRAMADOL HCL 50 MG PO TABS: 50.0000 mg | ORAL_TABLET | Freq: Four times a day (QID) | ORAL | Status: DC | PRN

## 2013-11-03 MED ORDER — ADENOSINE 6 MG/2ML IV SOLN
INTRAVENOUS | Status: AC
  Filled 2013-11-03: qty 6

## 2013-11-03 MED ORDER — ENOXAPARIN SODIUM 30 MG/0.3ML ~~LOC~~ SOLN
30.0000 mg | SUBCUTANEOUS | Status: DC
  Administered 2013-11-03: 30 mg via SUBCUTANEOUS
  Filled 2013-11-03 (×2): qty 0.3

## 2013-11-03 MED ORDER — COUMADIN BOOK
Freq: Once | Status: DC
  Filled 2013-11-03: qty 1

## 2013-11-03 MED ORDER — DARBEPOETIN ALFA-POLYSORBATE 150 MCG/0.3ML IJ SOLN
150.0000 ug | INTRAMUSCULAR | Status: DC
Start: 2013-11-05 — End: 2013-11-04

## 2013-11-03 MED ORDER — SEVELAMER CARBONATE 800 MG PO TABS
800.0000 mg | ORAL_TABLET | Freq: Three times a day (TID) | ORAL | Status: DC
  Filled 2013-11-03: qty 1

## 2013-11-03 NOTE — ED Provider Notes (Signed)
CSN: [id number]     Arrival date & time 11/03/13  1619 History   First MD Initiated Contact with Patient 11/03/13 1639     Chief Complaint  Patient presents with  . Hypertension  . Tachycardia   HPI  [name] is a 73 y.o. female with hx of lymphoma (Dr Marin Olp), ESRD on HD (Tu, [place], Sat), HTN, DM2, s/p radiation thx to right face who presents with cc tachycardia. Has been in her normal state of health over the last several days other than a mild cough. Today attended dialysis and completed all but 30 minutes of treatment. It was at that time she began to feel weak and felt her heart racing. She hasn't had symptoms like this before. She denies any chest pain or shortness of breath.   Past Medical History  Diagnosis Date  . Hypertension   . Anxiety     h/o anxiety attacks  . Anemia of renal disease   . Claustrophobia   . Panic attacks   . Type 2 diabetes mellitus     not on medication  . 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 CAD (coronary artery disease)   . Large cell (diffuse) non-Hodgkin's lymphoma   . Atrial fibrillation    Past Surgical History  Procedure Laterality Date  . Abdominal hysterectomy      30 yrs. ago  . Appendectomy  2009  . Tonsillectomy      age 65  . Cardiac catheterization  2011  . Rotator cuff repair      Right arm  . Esophagogastroduodenoscopy (egd) with propofol  10/06/2012    Procedure: ESOPHAGOGASTRODUODENOSCOPY (EGD) WITH PROPOFOL;  Surgeon: Arta Silence, MD;  Location: [facility];  Service: Endoscopy;  Laterality: N/A;  . Colonoscopy with propofol  10/06/2012    Procedure: COLONOSCOPY WITH PROPOFOL;  Surgeon: Arta Silence, MD;  Location: [facility];  Service: Endoscopy;  Laterality: N/A;  . Colonoscopy with propofol N/A 01/14/2013    Procedure: COLONOSCOPY WITH PROPOFOL;  Surgeon: Arta Silence, MD;  Location: [facility];  Service: Endoscopy;  Laterality: N/A;  . Portacath placement Right   . Portacath  placement Right 01/22/2013  . Av fistula placement Left 07/24/2013    Procedure: ARTERIOVENOUS (AV) FISTULA CREATION- LEFT ;  Surgeon: Angelia Mould, MD;  Location: [facility] OR;  Service: Vascular;  Laterality: Left;   Family History  Problem Relation Age of Onset  . Cancer Mother     throat  . Alzheimer's disease Sister   . Cancer Brother     lymphoma  . Cancer Sister     unknown type   History  Substance Use Topics  . Smoking status: Former Smoker -- 0.25 packs/day for 15 years    Quit date: 04/01/1964  . Smokeless tobacco: Never Used  . Alcohol Use: No   OB History   Grav Para Term Preterm Abortions TAB SAB Ect Mult Living                 Review of Systems  Constitutional: Negative for fever and chills.  Respiratory: Negative for shortness of breath.   Cardiovascular: Positive for palpitations. Negative for chest pain and leg swelling.  Gastrointestinal: Negative for nausea, vomiting and abdominal pain.  Genitourinary: Negative for dysuria and frequency.  Neurological: Positive for weakness.  All other systems reviewed and are negative.   Allergies  Advicor; Crestor; Lipitor; Lovastatin; Morphine and related; Niaspan; Nsaids; Pravachol; Sulfa antibiotics; Vytorin;  Welchol; Zetia; and Zocor  Home Medications   No current outpatient prescriptions on file. BP 161/62  Pulse 96  Temp(Src) 98.2 F (36.8 C) (Oral)  Resp 20  Ht 5\' 2"  (1.575 m)  Wt 123 lb 14.4 oz (56.2 kg)  BMI 22.66 kg/m2  SpO2 100% Physical Exam  Nursing note and vitals reviewed. Constitutional: She is oriented to person, place, and time. She appears well-developed and well-nourished. No distress.  HENT:  Head: Normocephalic and atraumatic.  Eyes: Conjunctivae are normal. Pupils are equal, round, and reactive to light.  Neck: Normal range of motion. Neck supple.  Cardiovascular: Regular rhythm.  Tachycardia present.  Exam reveals no gallop and no friction rub.   No murmur  heard. Pulmonary/Chest: Effort normal and breath sounds normal.  Abdominal: Soft. She exhibits no distension. There is no tenderness.  Musculoskeletal: Normal range of motion. She exhibits no edema and no tenderness.  Neurological: She is alert and oriented to person, place, and time. She has normal strength and normal reflexes. No cranial nerve deficit or sensory deficit.  Skin: Skin is warm and dry.  Psychiatric: She has a normal mood and affect.   ED Course  Procedures (including critical care time) Labs Review Labs Reviewed  CBC WITH DIFFERENTIAL - Abnormal; Notable for the following:    RBC 2.67 (*)    Hemoglobin 8.7 (*)    HCT 27.2 (*)    MCV 101.9 (*)    RDW 18.9 (*)    Monocytes Relative 26 (*)    Monocytes Absolute 1.1 (*)    All other components within normal limits  PRO B NATRIURETIC PEPTIDE - Abnormal; Notable for the following:    Pro B Natriuretic peptide (BNP) 6465.0 (*)    All other components within normal limits  CBC - Abnormal; Notable for the following:    WBC 3.7 (*)    RBC 2.26 (*)    Hemoglobin 7.4 (*)    HCT 23.3 (*)    MCV 103.1 (*)    RDW 19.1 (*)    All other components within normal limits  HEPATIC FUNCTION PANEL - Abnormal; Notable for the following:    Total Protein 5.7 (*)    Albumin 2.5 (*)    All other components within normal limits  POCT I-STAT, CHEM 8 - Abnormal; Notable for the following:    Potassium 3.0 (*)    BUN 5 (*)    Creatinine, Ser 2.30 (*)    Glucose, Bld 187 (*)    Calcium, Ion 1.03 (*)    Hemoglobin 9.9 (*)    HCT 29.0 (*)    All other components within normal limits  MAGNESIUM  TROPONIN I  TSH  HEMOGLOBIN A1C  TROPONIN I  TROPONIN I  CBC  COMPREHENSIVE METABOLIC PANEL  PROTIME-INR  POCT I-STAT TROPONIN I   Imaging Review Dg Chest Port 1 View  11/03/2013   CLINICAL DATA:  Tachycardia and chest pain.  EXAM: PORTABLE CHEST - 1 VIEW  COMPARISON:  10/01/2013  FINDINGS: Port-A-Cath and dialysis catheter show stable  positioning. External pacing pad overlies the chest. The lungs show no evidence of edema, infiltrate, pleural effusion or pneumothorax. Cardiac and mediastinal contours are stable and within normal limits.  IMPRESSION: No active disease.   Electronically Signed   By: Aletta Edouard M.D.   On: 11/03/2013 17:46    EKG Interpretation    Date/Time:  Tuesday November 03 2013 16:31:45 EST Ventricular Rate:  147 PR Interval:  57 QRS Duration:  118 QT Interval:  339 QTC Calculation: 530 R Axis:   92 Text Interpretation:  Supraventricular tachycardia RBBB and LPFB ST depression, probably rate related Confirmed by Christy Gentles  MD, DONALD [phone]) on 11/03/2013 4:39:47 PM           MDM   1. CAD (coronary artery disease)    Here with tachycardia and palpitations. Denies chest pain. EKG with narrow complex tachycardia. HR of 150. Appeared regular on initial EKG. Otherwise HDS. 6 mg of adenosine followed by 12 mg of adenosine failed to return the patient to NSR. When rhythm temporarily slowed there was no visible p-waves. Possibly a-fib with RVR. The patient was loaded with 10 mg of diltiazem followed by diltiazem gtt.   Converted to sinus rhythm after initiated gtt. CXR without acute abnormality. Doubt PE given no chest pain, SOB or hypoxia. Labs notable for mild hypokalemia and anemia of 8.7. No active bleeding or s/s of bleeding. Cardiology was consulted and the patient was admitted to the cardiology service in HDS condition.    Donita Brooks, MD 11/04/13 [phone]

## 2013-11-03 NOTE — ED Notes (Signed)
Dr. Wickline back at the bedside.  

## 2013-11-03 NOTE — Consult Note (Addendum)
Cardiology Consult Note  Admit date: 11/03/2013 Name: [name] 73 y.o.  female DOB:  [birth year] MRN:  [id number]  Today's date:  11/03/2013  Referring Physician:    [facility]  Reason for Consultation:   Atrial fibrillation  IMPRESSIONS: 1. Supraventricular tachycardia either SVT or atrial flutter with 2 to one block 2. History of atrial fibrillation with rapid ventricular response in the past 3. Hypertensive heart disease 4. History of mild coronary artery disease 5. Relapsed non-Hodgkin's lymphoma currently undergoing salvage chemotherapy 6. Type 2 diabetes 7. End stage renal disease on hemodialysis  RECOMMENDATION: 1. She is now back in sinus rhythm. I would add beta blockers since this appeared to be an isolated episode and she has not had arrhythmias in a while. 2. Recheck echocardiogram since she has been undergoing systemic chemotherapy. Rituxan has been associated with cardiac arrhythmias also. 3. Consider long-term anticoagulation with warfarin since she is on hemodialysis 4. If she has recurrent arrhythmias consider the use of amiodarone  HISTORY: This 74 year-old female was seen by me in 2011 when she had atrial fibrillation and was diagnosed with lymphoma. She had been treated with CHOP and was placed on Pradaxa at the time. She remained free of atrial fibrillation and presented with ST depression and chest pain and had an urgent catheterization was found to have mild to moderate coronary disease prominent involving the circumflex with mild disease in the LAD and minimal right coronary artery disease. Her last ejection fraction in March of this year was normal. She has been taken off of Pradaxa and has had not had much in the way of recurrence of atrial arrhythmias.  She developed relapsed lymphoma and has been undergoing salvage chemotherapy with rituxan, gemcitabine and oxalipatin with 3 cycles being given now. She is also developed end-stage renal  disease and has been on hemodialysis since earlier in the year. She also developed recurrence of the lymphoma above her orbit and received radiation therapy evidently twice. She evidently has recurrence in her lymph nodes and her parotid gland on the right.  She dialyzes on Tuesday Thursday and Saturday and was in dialysis today and developed a rapid supraventricular rhythm and was brought here. It did not respond to adenosine and she was placed on treatment with intravenous diltiazem and has converted to sinus rhythm and feels much better now. She develop shortness of breath and chest pain at the time she was having rapid rhythm. She did not feel well when she went to hemodialysis is feeling better now. She does not normally have any PND orthopnea and does not have claudication.  Past Medical History  Diagnosis Date  . Hypertension   . Anxiety     h/o anxiety attacks  . Anemia of renal disease   . Claustrophobia   . Panic attacks   . Type 2 diabetes mellitus     not on medication  . 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 CAD (coronary artery disease)   . Large cell (diffuse) non-Hodgkin's lymphoma   . Atrial fibrillation       Past Surgical History  Procedure Laterality Date  . Abdominal hysterectomy      30 yrs. ago  . Appendectomy  2009  . Tonsillectomy      age 70  . Cardiac catheterization  2011  . Rotator cuff repair      Right arm  . Esophagogastroduodenoscopy (egd) with propofol  10/06/2012  Procedure: ESOPHAGOGASTRODUODENOSCOPY (EGD) WITH PROPOFOL;  Surgeon: Arta Silence, MD;  Location: [facility];  Service: Endoscopy;  Laterality: N/A;  . Colonoscopy with propofol  10/06/2012    Procedure: COLONOSCOPY WITH PROPOFOL;  Surgeon: Arta Silence, MD;  Location: [facility];  Service: Endoscopy;  Laterality: N/A;  . Colonoscopy with propofol N/A 01/14/2013    Procedure: COLONOSCOPY WITH PROPOFOL;  Surgeon: Arta Silence, MD;  Location: [facility];  Service: Endoscopy;  Laterality: N/A;  . Portacath placement Right   . Portacath placement Right 01/22/2013  . Av fistula placement Left 07/24/2013    Procedure: ARTERIOVENOUS (AV) FISTULA CREATION- LEFT ;  Surgeon: Angelia Mould, MD;  Location: [facility];  Service: Vascular;  Laterality: Left;     Allergies:  is allergic to advicor; crestor; lipitor; lovastatin; morphine and related; niaspan; nsaids; pravachol; sulfa antibiotics; vytorin; welchol; zetia; and zocor.   Medications: Prior to Admission medications   Medication Sig Start Date End Date Taking? Authorizing Provider  ALPRAZolam Duanne Moron) 0.5 MG tablet Take 0.5 mg by mouth. 1-2 tabs by mouth every 8 hours as needed for anxiety. 07/14/13  Yes Historical Provider, MD  dexamethasone (DECADRON) 4 MG tablet Take 2 tabs daily with a meal for 3 days starting the day after chemo. 09/07/13  Yes Volanda Napoleon, MD  docusate sodium 100 MG CAPS Take 200 mg by mouth 3 (three) times daily as needed for constipation. 01/27/13  Yes Volanda Napoleon, MD  esomeprazole (NEXIUM) 40 MG capsule Take 40 mg by mouth daily.     Yes Historical Provider, MD  famciclovir [facility]) 125 MG tablet Take 1 tablet on T-Th-Sat AFTER dialysis 09/07/13  Yes Volanda Napoleon, MD  fentaNYL (DURAGESIC - DOSED MCG/HR) 12 MCG/HR Place 1 patch (12.5 mcg total) onto the skin every 3 (three) days. 10/05/13  Yes Volanda Napoleon, MD  lidocaine-prilocaine (EMLA) cream Apply 1 application topically as needed (for port-a-cath access).   Yes Historical Provider, MD  loratadine (CLARITIN) 10 MG tablet Take 10 mg by mouth daily as needed for allergies.    Yes Historical Provider, MD  multivitamin (RENA-VIT) TABS tablet Take 1 tablet by mouth daily.   Yes Historical Provider, MD  ondansetron (ZOFRAN-ODT) 8 MG disintegrating tablet Take 1 tab twice daily starting the day after chemo x 3 days then twice daily as needed for nausea 09/07/13  Yes Volanda Napoleon, MD  OVER THE COUNTER  MEDICATION Take 5 mLs by mouth 2 (two) times daily at 10 AM and 5 PM. creamotion cough syrup   Yes Historical Provider, MD  oxyCODONE-acetaminophen (PERCOCET/ROXICET) 5-325 MG per tablet Take 1-2 tablets by mouth every 4 (four) hours as needed for moderate pain. 10/07/13  Yes Blair Promise, MD  RENVELA 800 MG tablet Take 800 mg by mouth 3 (three) times daily with meals.  09/17/13  Yes Historical Provider, MD  traMADol (ULTRAM) 50 MG tablet Take 50 mg by mouth every 6 (six) hours as needed for moderate pain.   Yes Historical Provider, MD    Family History: Family Status  Relation Status Death Age  . Mother Deceased     from fall  . Father Deceased     MI  . Sister Alive   . Brother Deceased   . Sister Deceased     Social History:   reports that she quit smoking about 49 years ago. She has never used smokeless tobacco. She reports that she does not drink alcohol or use illicit drugs.  History   Social History Narrative  . No narrative on file    Review of Systems: Significant malaise and fatigue. Facial swelling on the right. She has occasional nausea and diminished appetite. Other than as noted above the remainder of the review of systems is unremarkable.  Physical Exam: BP 139/54  Pulse 98  Resp 12  Ht 5\' 2"  (1.575 m)  Wt 59.875 kg (132 lb)  BMI 24.14 kg/m2  SpO2 99%  General appearance: Pleasant elderly appearing female who is currently in no acute distress Head: Normocephalic, without obvious abnormality, atraumatic, swelling of the right parotid gland and right face. Eyes: conjunctivae/corneas clear. PERRL, EOM's intact. Fundi not examined  Neck: no adenopathy, no carotid bruit, no JVD and supple, symmetrical, trachea midline Lungs: clear to auscultation bilaterally Heart: regular rate and rhythm, S1, S2 normal, no murmur, click, rub or gallop, possible transmitted murmur from fistula noted in left upper chest Abdomen: soft, non-tender; bowel sounds normal; no  masses,  no organomegaly Pelvic: deferred Extremities: Dialysis fistula is present in the left arm. She has a tunneled Port-A-Cath and also has a tunneled dialysis catheter present. She has erythema and some ecchymoses noted in her left upper arm. Pulses: 2+ and symmetric Skin: Skin color, texture, turgor normal. No rashes or lesions Neurologic: Grossly normal   Labs: CBC  Recent Labs  11/03/13 1655 11/03/13 1731  WBC 4.2  --   RBC 2.67*  --   HGB 8.7* 9.9*  HCT 27.2* 29.0*  PLT 192  --   MCV 101.9*  --   MCH 32.6  --   MCHC 32.0  --   RDW 18.9*  --   LYMPHSABS 1.2  --   MONOABS 1.1*  --   EOSABS 0.1  --   BASOSABS 0.0  --    CMP   Recent Labs  11/03/13 1731  NA 138  K 3.0*  CL 96  GLUCOSE 187*  BUN 5*  CREATININE 2.30*   BNP (last 3 results)  Recent Labs  11/03/13 1655  PROBNP 6465.0*   Cardiac Panel (last 3 results) Troponin (Point of Care Test)  Recent Labs  11/03/13 1729  TROPIPOC 0.04     Radiology: No acute disease  EKG: Initial EKG shows a supraventricular tachycardia with right bundle branch block, subsequent EKG shows normal sinus rhythm.  Signed:  Kerry Hough MD [facility]   Cardiology Consultant  11/03/2013, 6:56 PM

## 2013-11-03 NOTE — Progress Notes (Signed)
ANTICOAGULATION CONSULT NOTE - Initial Consult  Pharmacy Consult:  Coumadin Indication: atrial fibrillation  Allergies  Allergen Reactions  . Advicor [Niacin-Lovastatin Er]     Muscle cramps  . Crestor [Rosuvastatin]     Muscle cramps  . Lipitor [Atorvastatin]     Muscle cramps  . Lovastatin     Muscle cramps  . Morphine And Related Other (See Comments)    "made me crazy"  . Niaspan [Niacin Er]     Muscle cramps  . Nsaids     Gi side effects  . Pravachol [Pravastatin Sodium]     Gi side effects  . Sulfa Antibiotics Other (See Comments)    Can't remember   . Vytorin [Ezetimibe-Simvastatin] Nausea Only  . Welchol [Colesevelam Hcl]     Muscle cramps  . Zetia [Ezetimibe]     Muscle cramps   . Zocor [Simvastatin]     Muscle cramps     Patient Measurements: Height: 5\' 2"  (157.5 cm) Weight: 132 lb (59.875 kg) IBW/kg (Calculated) : 50.1  Vital Signs: BP: 138/50 mmHg (01/06 1900) Pulse Rate: 90 (01/06 1900)  Labs:  Recent Labs  11/03/13 1655 11/03/13 1731  HGB 8.7* 9.9*  HCT 27.2* 29.0*  PLT 192  --   CREATININE  --  2.30*    Estimated Creatinine Clearance: 17.7 ml/min (by C-G formula based on Cr of 2.3).   Medical History: Past Medical History  Diagnosis Date  . Hypertension   . Anxiety     h/o anxiety attacks  . Anemia of renal disease   . Claustrophobia   . Panic attacks   . Type 2 diabetes mellitus     not on medication  . 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 CAD (coronary artery disease)   . Large cell (diffuse) non-Hodgkin's lymphoma   . Atrial fibrillation        Assessment: 7 YOF with history of Afib previously on Pradaxa and was taken off, now to start Coumadin therapy.  No INR but expect it to be at baseline as Coumadin is new to patient.  No bleeding reported.   Goal of Therapy:  INR 2 - 3 Monitor platelets by anticoagulation protocol: Yes    Plan:  - Coumadin 5mg  PO today - Lovenox 30mg  SQ  Q24H until INR therapeutic - Daily PT / INR - Coumadin book / video - F/U KCL supplementation    Raelynn Corron D. Mina Marble, PharmD, BCPS Pager:  [phone] 11/03/2013, 7:44 PM

## 2013-11-03 NOTE — Consult Note (Signed)
Reason for Consult:ESRD, Anemia, Afib, HPTH Referring Physician: Dr. Dina Rich is an 73 y.o. female.  HPI: 73 yr female on HD x 4 mon at [facility].  Today at HD after experienced Afib with RVR.  Now has slowed and is reg after Adenosine and Cardiazem.  Hx Large cell lymphoma of head, neck and lung.  S/p radiation x3 and now getting chemo (actually due tomorrow).  Hx transient Afib in Aug. No recent CP, but indigestion, not SOB , no PND or orthop and edema that has at start of HD several months ago is almost gone.  Little energy and poor appetite. Recent cough with minimal phlegm.  Regrowth of mass in R parotid. Constitutional: as above. N after chemo , tol HD well usually.  appetite marginall Eyes: wears glasses Ears, nose, mouth, throat, and face: dry mouth and occ sores after radiation Respiratory: as above Cardiovascular: as above, constip also Gastrointestinal: see above Genitourinary:negative Integument/breast: skin dry Hematologic/lymphatic: last HB 9.6, 11400 epo Musculoskeletal:L hip pain Neurological: negative Allergic/Immunologic: see list   Dialyzes at [facility] on TTS since Sept 2014. Primary Nephrologist Talicia Sui. EDW 59kg. HD Bath 2K, 2.25 Ca, Dialyzer 180NR, Heparin 5000Units pre . Access Cath, recent infilt and swelling L arm.  Past Medical History  Diagnosis Date  . Hypertension   . Anxiety     h/o anxiety attacks  . Anemia of renal disease   . Claustrophobia   . Panic attacks   . Type 2 diabetes mellitus     not on medication  . 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 CAD (coronary artery disease)   . Large cell (diffuse) non-Hodgkin's lymphoma   . Atrial fibrillation     Past Surgical History  Procedure Laterality Date  . Abdominal hysterectomy      30 yrs. ago  . Appendectomy  2009  . Tonsillectomy      age 66  . Cardiac catheterization  2011  . Rotator cuff repair      Right arm  . Esophagogastroduodenoscopy  (egd) with propofol  10/06/2012    Procedure: ESOPHAGOGASTRODUODENOSCOPY (EGD) WITH PROPOFOL;  Surgeon: Arta Silence, MD;  Location: [facility];  Service: Endoscopy;  Laterality: N/A;  . Colonoscopy with propofol  10/06/2012    Procedure: COLONOSCOPY WITH PROPOFOL;  Surgeon: Arta Silence, MD;  Location: [facility];  Service: Endoscopy;  Laterality: N/A;  . Colonoscopy with propofol N/A 01/14/2013    Procedure: COLONOSCOPY WITH PROPOFOL;  Surgeon: Arta Silence, MD;  Location: [facility];  Service: Endoscopy;  Laterality: N/A;  . Portacath placement Right   . Portacath placement Right 01/22/2013  . Av fistula placement Left 07/24/2013    Procedure: ARTERIOVENOUS (AV) FISTULA CREATION- LEFT ;  Surgeon: Angelia Mould, MD;  Location: [facility] OR;  Service: Vascular;  Laterality: Left;    Family History  Problem Relation Age of Onset  . Cancer Mother     throat  . Alzheimer's disease Sister   . Cancer Brother     lymphoma  . Cancer Sister     unknown type    Social History:  reports that she quit smoking about 49 years ago. She has never used smokeless tobacco. She reports that she does not drink alcohol or use illicit drugs.  Allergies:  Allergies  Allergen Reactions  . Advicor [Niacin-Lovastatin Er]     Muscle cramps  . Crestor [Rosuvastatin]     Muscle cramps  . Lipitor [  Atorvastatin]     Muscle cramps  . Lovastatin     Muscle cramps  . Morphine And Related Other (See Comments)    "made me crazy"  . Niaspan [Niacin Er]     Muscle cramps  . Nsaids     Gi side effects  . Pravachol [Pravastatin Sodium]     Gi side effects  . Sulfa Antibiotics Other (See Comments)    Can't remember   . Vytorin [Ezetimibe-Simvastatin] Nausea Only  . Welchol [Colesevelam Hcl]     Muscle cramps  . Zetia [Ezetimibe]     Muscle cramps   . Zocor [Simvastatin]     Muscle cramps     Medications:  I have reviewed the patient's current medications. Prior to Admission:   Prescriptions prior to admission  Medication Sig Dispense Refill  . ALPRAZolam (XANAX) 0.5 MG tablet Take 0.5 mg by mouth. 1-2 tabs by mouth every 8 hours as needed for anxiety.      Marland Kitchen dexamethasone (DECADRON) 4 MG tablet Take 2 tabs daily with a meal for 3 days starting the day after chemo.  24 tablet  2  . docusate sodium 100 MG CAPS Take 200 mg by mouth 3 (three) times daily as needed for constipation.  60 capsule  2  . esomeprazole (NEXIUM) 40 MG capsule Take 40 mg by mouth daily.        . famciclovir (FAMVIR) 125 MG tablet Take 1 tablet on T-Th-Sat AFTER dialysis  30 tablet  2  . fentaNYL (DURAGESIC - DOSED MCG/HR) 12 MCG/HR Place 1 patch (12.5 mcg total) onto the skin every 3 (three) days.  10 patch  0  . lidocaine-prilocaine (EMLA) cream Apply 1 application topically as needed (for port-a-cath access).      Marland Kitchen loratadine (CLARITIN) 10 MG tablet Take 10 mg by mouth daily as needed for allergies.       . multivitamin (RENA-VIT) TABS tablet Take 1 tablet by mouth daily.      . ondansetron (ZOFRAN-ODT) 8 MG disintegrating tablet Take 1 tab twice daily starting the day after chemo x 3 days then twice daily as needed for nausea  20 tablet  1  . OVER THE COUNTER MEDICATION Take 5 mLs by mouth 2 (two) times daily at 10 AM and 5 PM. creamotion cough syrup      . oxyCODONE-acetaminophen (PERCOCET/ROXICET) 5-325 MG per tablet Take 1-2 tablets by mouth every 4 (four) hours as needed for moderate pain.      Marland Kitchen RENVELA 800 MG tablet Take 800 mg by mouth 3 (three) times daily with meals.       . traMADol (ULTRAM) 50 MG tablet Take 50 mg by mouth every 6 (six) hours as needed for moderate pain.      , Hectorol 0, . Epogen 11400 U, .   Results for orders placed during the hospital encounter of 11/03/13 (from the past 48 hour(s))  CBC WITH DIFFERENTIAL     Status: Abnormal   Collection Time    11/03/13  4:55 PM      Result Value Range   WBC 4.2  4.0 - 10.5 K/uL   RBC 2.67 (*) 3.87 - 5.11 MIL/uL    Hemoglobin 8.7 (*) 12.0 - 15.0 g/dL   HCT 27.2 (*) 36.0 - 46.0 %   MCV 101.9 (*) 78.0 - 100.0 fL   MCH 32.6  26.0 - 34.0 pg   MCHC 32.0  30.0 - 36.0 g/dL   RDW 18.9 (*) 11.5 -  15.5 %   Platelets 192  150 - 400 K/uL   Neutrophils Relative % 43  43 - 77 %   Neutro Abs 1.8  1.7 - 7.7 K/uL   Lymphocytes Relative 29  12 - 46 %   Lymphs Abs 1.2  0.7 - 4.0 K/uL   Monocytes Relative 26 (*) 3 - 12 %   Monocytes Absolute 1.1 (*) 0.1 - 1.0 K/uL   Eosinophils Relative 1  0 - 5 %   Eosinophils Absolute 0.1  0.0 - 0.7 K/uL   Basophils Relative 0  0 - 1 %   Basophils Absolute 0.0  0.0 - 0.1 K/uL  PRO B NATRIURETIC PEPTIDE     Status: Abnormal   Collection Time    11/03/13  4:55 PM      Result Value Range   Pro B Natriuretic peptide (BNP) 6465.0 (*) 0 - 125 pg/mL  POCT I-STAT TROPONIN I     Status: None   Collection Time    11/03/13  5:29 PM      Result Value Range   Troponin i, poc 0.04  0.00 - 0.08 ng/mL   Comment 3            Comment: Due to the release kinetics of cTnI,     a negative result within the first hours     of the onset of symptoms does not rule out     myocardial infarction with certainty.     If myocardial infarction is still suspected,     repeat the test at appropriate intervals.  POCT I-STAT, CHEM 8     Status: Abnormal   Collection Time    11/03/13  5:31 PM      Result Value Range   Sodium 138  137 - 147 mEq/L   Potassium 3.0 (*) 3.7 - 5.3 mEq/L   Chloride 96  96 - 112 mEq/L   BUN 5 (*) 6 - 23 mg/dL   Creatinine, Ser 2.30 (*) 0.50 - 1.10 mg/dL   Glucose, Bld 187 (*) 70 - 99 mg/dL   Calcium, Ion 1.03 (*) 1.13 - 1.30 mmol/L   TCO2 31  0 - 100 mmol/L   Hemoglobin 9.9 (*) 12.0 - 15.0 g/dL   HCT 29.0 (*) 36.0 - 46.0 %    Dg Chest Port 1 View  11/03/2013   CLINICAL DATA:  Tachycardia and chest pain.  EXAM: PORTABLE CHEST - 1 VIEW  COMPARISON:  10/01/2013  FINDINGS: Port-A-Cath and dialysis catheter show stable positioning. External pacing pad overlies the chest. The  lungs show no evidence of edema, infiltrate, pleural effusion or pneumothorax. Cardiac and mediastinal contours are stable and within normal limits.  IMPRESSION: No active disease.   Electronically Signed   By: Aletta Edouard M.D.   On: 11/03/2013 17:46    ROS Blood pressure 161/62, pulse 96, temperature 98.2 F (36.8 C), temperature source Oral, resp. rate 20, height 5\' 2"  (1.575 m), weight 59.875 kg (132 lb), SpO2 100.00%. Physical Exam Physical Examination: General appearance - pale , anxious, well nourished Mental status - alert, oriented to person, place, and time, anxious Eyes - pupils equal and reactive, extraocular eye movements intact, funduscopic exam normal, discs flat and sharp Mouth - erythematous Neck - adenopathy noted, PCL, mass firm in R parotid 8 cm dia 2-3 cm deep Lymphatics - posterior cervical nodes Chest - good bs, occ rhonchi, wheeze Heart - S1 and S2 normal, systolic murmur ID7/8 at apex Abdomen - soft,  nontender, liver down 4 cm Extremities - 1+ edema, 1+ DP L arm swollen and bruises UA but entire arm swollen, ? SCV occlusion.Catheter on that side.  AVF patent. Skin - pale, darker over parotid, radrx ports  Assessment/Plan: 1 Afib needs rate control. Coumadin is a significant risk and risk/benefit ratio in ESRD is not good.  Full education of this to patient needs to be done before subjecting to this.  2 ESRD: will do HD on Wed.  Needs shuntogram to eval for Stenosis  3 Hypertension: control vol 4. Anemia of ESRD: use epo, Fe ok 5. Metabolic Bone Disease: Use low dose Vit D 6 Afib stable, suspect tx for Lymphoma plays a role 7 Lymphoma chemotx P HD, epo, vit D meds,   Tiegan Jambor L 11/03/2013, 9:49 PM

## 2013-11-03 NOTE — ED Notes (Signed)
Report given to 3W RN

## 2013-11-03 NOTE — ED Notes (Signed)
10mg  bolus of cardizem given from bag.

## 2013-11-03 NOTE — ED Notes (Signed)
Pt to department via EMS- pt reports that she was at dialysis and became tachycardiac and hypertensive. Denies any pain at this time. Bp-156/78 Hr-160-210 Cbg-144. No hx of the same.

## 2013-11-03 NOTE — ED Notes (Signed)
Attempted adenosine, pt HR noted at 160.

## 2013-11-03 NOTE — ED Notes (Signed)
Zoll pads placed to patient. Adenosine orders placed.

## 2013-11-03 NOTE — ED Provider Notes (Signed)
Patient seen/examined in the Emergency Department in conjunction with Resident Physician Provider Bringol Patient reports palpitations Exam : awake/alert, tachycardia noted Plan: suspect SVT.  Adenosine given but no improvement, will place on diltiazem    Sharyon Cable, MD 11/03/13 1719

## 2013-11-03 NOTE — ED Notes (Signed)
Cardiology at the bedside.

## 2013-11-03 NOTE — H&P (Addendum)
[facility] History and Physical  [name]:[id number] DOB: 06/11/1941 DOA: 11/03/2013  Referring physician:  PCP: [name], MD   Chief Complaint: *Atrial fibrillation with rapid ventricular response  HPI:  73 year old female with a history of non-Hodgkin's lymphoma, status post 3 cycles of gemcitabine/oxaliplatin/Rituxan, due for next chemotherapy tomorrow,  Presents  to the ER with a heart rate of 144, in SVT/atrial flutter that started during dialysis today. She received adenosine in the ER, and was subsequently started on Cardizem drip and converted to normal sinus rhythm. She was symptomatic with chest pain and shortness of breath, and had to stop dialysis. She had 30 minutes left to complete her session. She has otherwise been feeling well with no intercurrent illness. Family states that patient stays lightheaded because of poor oral intake, more so because of nausea related to the chemotherapy, and dialysis. She has had no recent falls .   Patient has a history of atrial fibrillation several years ago and used to be on Pradaxa, which was discontinued because of resolution of atrial fibrillation. She had cardiac catheterization in 2011. Followed closely by Dr. [name].    Review of Systems: negative for the following  Constitutional: As history of present illness  HEENT: Denies photophobia, eye pain, redness, hearing loss, ear pain, congestion, sore throat, rhinorrhea, sneezing, mouth sores, trouble swallowing, neck pain, neck stiffness and tinnitus.  Respiratory: As in history of present illness Cardiovascular: Denies chest pain, palpitations and leg swelling.  Gastrointestinal: Denies nausea, vomiting, abdominal pain, diarrhea, constipation, blood in stool and abdominal distention.  Genitourinary: Denies dysuria, urgency, frequency, hematuria, flank pain and difficulty urinating.  Musculoskeletal: Denies myalgias, back pain, joint swelling, arthralgias and  gait problem.  Skin: Denies pallor, rash and wound.  Neurological: Denies dizziness, seizures, syncope, weakness, light-headedness, numbness and headaches.  Hematological: Denies adenopathy. Easy bruising, personal or family bleeding history  Psychiatric/Behavioral: Denies suicidal ideation, mood changes, confusion, nervousness, sleep disturbance and agitation        Past Medical History  Diagnosis Date  . Hypertension   . Anxiety     h/o anxiety attacks  . Anemia of renal disease   . Claustrophobia   . Panic attacks   . Type 2 diabetes mellitus     not on medication  . 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 CAD (coronary artery disease)   . Large cell (diffuse) non-Hodgkin's lymphoma   . Atrial fibrillation      Past Surgical History  Procedure Laterality Date  . Abdominal hysterectomy      30 yrs. ago  . Appendectomy  2009  . Tonsillectomy      age 9  . Cardiac catheterization  2011  . Rotator cuff repair      Right arm  . Esophagogastroduodenoscopy (egd) with propofol  10/06/2012    Procedure: ESOPHAGOGASTRODUODENOSCOPY (EGD) WITH PROPOFOL;  Surgeon: Arta Silence, MD;  Location: [facility];  Service: Endoscopy;  Laterality: N/A;  . Colonoscopy with propofol  10/06/2012    Procedure: COLONOSCOPY WITH PROPOFOL;  Surgeon: Arta Silence, MD;  Location: [facility];  Service: Endoscopy;  Laterality: N/A;  . Colonoscopy with propofol N/A 01/14/2013    Procedure: COLONOSCOPY WITH PROPOFOL;  Surgeon: Arta Silence, MD;  Location: [facility];  Service: Endoscopy;  Laterality: N/A;  . Portacath placement Right   . Portacath placement Right 01/22/2013  . Av fistula placement Left 07/24/2013    Procedure: ARTERIOVENOUS (AV) FISTULA CREATION- LEFT ;  Surgeon: Angelia Mould, MD;  Location: [facility] OR;  Service: Vascular;  Laterality: Left;      Social History:  reports that she quit smoking about 49 years ago. She has never used  smokeless tobacco. She reports that she does not drink alcohol or use illicit drugs.    Allergies  Allergen Reactions  . Advicor [Niacin-Lovastatin Er]     Muscle cramps  . Crestor [Rosuvastatin]     Muscle cramps  . Lipitor [Atorvastatin]     Muscle cramps  . Lovastatin     Muscle cramps  . Morphine And Related Other (See Comments)    "made me crazy"  . Niaspan [Niacin Er]     Muscle cramps  . Nsaids     Gi side effects  . Pravachol [Pravastatin Sodium]     Gi side effects  . Sulfa Antibiotics Other (See Comments)    Can't remember   . Vytorin [Ezetimibe-Simvastatin] Nausea Only  . Welchol [Colesevelam Hcl]     Muscle cramps  . Zetia [Ezetimibe]     Muscle cramps   . Zocor [Simvastatin]     Muscle cramps     Family History  Problem Relation Age of Onset  . Cancer Mother     throat  . Alzheimer's disease Sister   . Cancer Brother     lymphoma  . Cancer Sister     unknown type     Prior to Admission medications   Medication Sig Start Date End Date Taking? Authorizing Provider  ALPRAZolam Duanne Moron) 0.5 MG tablet Take 0.5 mg by mouth. 1-2 tabs by mouth every 8 hours as needed for anxiety. 07/14/13  Yes Historical Provider, MD  dexamethasone (DECADRON) 4 MG tablet Take 2 tabs daily with a meal for 3 days starting the day after chemo. 09/07/13  Yes Volanda Napoleon, MD  docusate sodium 100 MG CAPS Take 200 mg by mouth 3 (three) times daily as needed for constipation. 01/27/13  Yes Volanda Napoleon, MD  esomeprazole (NEXIUM) 40 MG capsule Take 40 mg by mouth daily.     Yes Historical Provider, MD  famciclovir [facility]) 125 MG tablet Take 1 tablet on T-Th-Sat AFTER dialysis 09/07/13  Yes Volanda Napoleon, MD  fentaNYL (DURAGESIC - DOSED MCG/HR) 12 MCG/HR Place 1 patch (12.5 mcg total) onto the skin every 3 (three) days. 10/05/13  Yes Volanda Napoleon, MD  lidocaine-prilocaine (EMLA) cream Apply 1 application topically as needed (for port-a-cath access).   Yes Historical Provider,  MD  loratadine (CLARITIN) 10 MG tablet Take 10 mg by mouth daily as needed for allergies.    Yes Historical Provider, MD  multivitamin (RENA-VIT) TABS tablet Take 1 tablet by mouth daily.   Yes Historical Provider, MD  ondansetron (ZOFRAN-ODT) 8 MG disintegrating tablet Take 1 tab twice daily starting the day after chemo x 3 days then twice daily as needed for nausea 09/07/13  Yes Volanda Napoleon, MD  OVER THE COUNTER MEDICATION Take 5 mLs by mouth 2 (two) times daily at 10 AM and 5 PM. creamotion cough syrup   Yes Historical Provider, MD  oxyCODONE-acetaminophen (PERCOCET/ROXICET) 5-325 MG per tablet Take 1-2 tablets by mouth every 4 (four) hours as needed for moderate pain. 10/07/13  Yes Blair Promise, MD  RENVELA 800 MG tablet Take 800 mg by mouth 3 (three) times daily with meals.  09/17/13  Yes Historical Provider, MD  traMADol (ULTRAM) 50 MG tablet Take 50 mg by mouth every 6 (six) hours  as needed for moderate pain.   Yes Historical Provider, MD     Physical Exam: Filed Vitals:   11/03/13 1715 11/03/13 1745 11/03/13 1815 11/03/13 1900  BP: 135/77 135/63 139/54 138/50  Pulse:  143 98 90  Resp: 24 12 12 15   Height:      Weight:      SpO2:  100% 99% 100%     Constitutional: Vital signs reviewed. Patient is a well-developed and well-nourished in no acute distress and cooperative with exam. Alert and oriented x3.  Head: Normocephalic and atraumatic  Ear: TM normal bilaterally  Mouth: no erythema or exudates, MMM  Eyes: PERRL, EOMI, conjunctivae normal, No scleral icterus.  Neck: Supple, Trachea midline normal ROM, No JVD, mass, thyromegaly, or carotid bruit present.  Cardiovascular: RRR, S1 normal, S2 normal, no MRG, pulses symmetric and intact bilaterally  Pulmonary/Chest: CTAB, no wheezes, rales, or rhonchi  Abdominal: Soft. Non-tender, non-distended, bowel sounds are normal, no masses, organomegaly, or guarding present.  GU: no CVA tenderness Musculoskeletal: No joint deformities,  erythema, or stiffness, ROM full and no nontender Ext: no edema and no cyanosis, pulses palpable bilaterally (DP and PT)  Hematology: no cervical, inginal, or axillary adenopathy.  Neurological: A&O x3, Strenght is normal and symmetric bilaterally, cranial nerve II-XII are grossly intact, no focal motor deficit, sensory intact to light touch bilaterally.  Skin: Warm, dry and intact. No rash, cyanosis, or clubbing.  Psychiatric: Normal mood and affect. speech and behavior is normal. Judgment and thought content normal. Cognition and memory are normal.       Labs on Admission:    Basic Metabolic Panel:  Recent Labs Lab 11/03/13 1731  NA 138  K 3.0*  CL 96  GLUCOSE 187*  BUN 5*  CREATININE 2.30*   Liver Function Tests: No results found for this basename: AST, ALT, ALKPHOS, BILITOT, PROT, ALBUMIN,  in the last 168 hours No results found for this basename: LIPASE, AMYLASE,  in the last 168 hours No results found for this basename: AMMONIA,  in the last 168 hours CBC:  Recent Labs Lab 11/03/13 1655 11/03/13 1731  WBC 4.2  --   NEUTROABS 1.8  --   HGB 8.7* 9.9*  HCT 27.2* 29.0*  MCV 101.9*  --   PLT 192  --    Cardiac Enzymes: No results found for this basename: CKTOTAL, CKMB, CKMBINDEX, TROPONINI,  in the last 168 hours  BNP (last 3 results)  Recent Labs  11/03/13 1655  PROBNP 6465.0*      CBG: No results found for this basename: GLUCAP,  in the last 168 hours  Radiological Exams on Admission: Dg Chest Port 1 View  11/03/2013   CLINICAL DATA:  Tachycardia and chest pain.  EXAM: PORTABLE CHEST - 1 VIEW  COMPARISON:  10/01/2013  FINDINGS: Port-A-Cath and dialysis catheter show stable positioning. External pacing pad overlies the chest. The lungs show no evidence of edema, infiltrate, pleural effusion or pneumothorax. Cardiac and mediastinal contours are stable and within normal limits.  IMPRESSION: No active disease.   Electronically Signed   By: Aletta Edouard  M.D.   On: 11/03/2013 17:46    EKG: Independently reviewed. Initial EKG shows a supraventricular tachycardia with right bundle branch block, subsequent EKG shows normal sinus rhythm   Assessment/Plan Principal Problem:   Atrial fibrillation Active Problems:   Large cell (diffuse) non-Hodgkin's lymphoma   Type II or unspecified type diabetes mellitus with ketoacidosis, not stated as uncontrolled   ESRD on hemodialysis  Atrial fibrillation with RVR    SVT Responded well to adenosine and Cardizem drip Started on metoprolol 25 mg by mouth twice a day by cards  Titrate off Cardizem drip Started on anticoagulation by cardiology Will receive Coumadin tonight Cycle cardiac enzymes 2-D echo in the morning TSH    non-Hodgkin's lymphoma currently undergoing salvage chemotherapy Due for chemotherapy tomorrow Will notify Dr. Charlesetta Garibaldi Fentanyl patch for pain control    End-stage renal disease Hemodialysis Tuesday Thursday Saturday we'll notify nephrology May get discharged, with no need for inpatient dialysis tomorrow   Type 2 diabetes Last hemoglobin A1c was 7/12   6.6 Recheck A1c     Code Status:   full Family Communication: bedside Disposition Plan: Observation   Time spent: 70 mins   [facility] Pager [phone]  If 7PM-7AM, please contact night-coverage [email or web address] Password [facility] 11/03/2013, 7:39 PM

## 2013-11-04 ENCOUNTER — Encounter (HOSPITAL_COMMUNITY): Payer: Self-pay | Admitting: General Practice

## 2013-11-04 ENCOUNTER — Ambulatory Visit: Payer: Medicare Other

## 2013-11-04 ENCOUNTER — Other Ambulatory Visit: Payer: Self-pay | Admitting: Hematology & Oncology

## 2013-11-04 ENCOUNTER — Other Ambulatory Visit: Payer: Medicare Other | Admitting: Lab

## 2013-11-04 ENCOUNTER — Telehealth: Payer: Self-pay | Admitting: Hematology & Oncology

## 2013-11-04 ENCOUNTER — Ambulatory Visit: Payer: Medicare Other | Admitting: Hematology & Oncology

## 2013-11-04 DIAGNOSIS — C8589 Other specified types of non-Hodgkin lymphoma, extranodal and solid organ sites: Secondary | ICD-10-CM

## 2013-11-04 DIAGNOSIS — Z992 Dependence on renal dialysis: Secondary | ICD-10-CM

## 2013-11-04 DIAGNOSIS — D631 Anemia in chronic kidney disease: Secondary | ICD-10-CM

## 2013-11-04 DIAGNOSIS — E1110 Type 2 diabetes mellitus with ketoacidosis without coma: Secondary | ICD-10-CM

## 2013-11-04 DIAGNOSIS — N186 End stage renal disease: Secondary | ICD-10-CM

## 2013-11-04 DIAGNOSIS — I4891 Unspecified atrial fibrillation: Secondary | ICD-10-CM

## 2013-11-04 DIAGNOSIS — N039 Chronic nephritic syndrome with unspecified morphologic changes: Secondary | ICD-10-CM

## 2013-11-04 LAB — HEMOGLOBIN A1C
Hgb A1c MFr Bld: 5.9 % — ABNORMAL HIGH (ref <5.7)
Mean Plasma Glucose: 123 mg/dL — ABNORMAL HIGH (ref <117)

## 2013-11-04 LAB — COMPREHENSIVE METABOLIC PANEL
ALBUMIN: 2.5 g/dL — AB (ref 3.5–5.2)
ALT: 9 U/L (ref 0–35)
AST: 20 U/L (ref 0–37)
Alkaline Phosphatase: 62 U/L (ref 39–117)
BUN: 9 mg/dL (ref 6–23)
CALCIUM: 8.3 mg/dL — AB (ref 8.4–10.5)
CO2: 30 mEq/L (ref 19–32)
CREATININE: 3.43 mg/dL — AB (ref 0.50–1.10)
Chloride: 97 mEq/L (ref 96–112)
GFR calc Af Amer: 14 mL/min — ABNORMAL LOW (ref >90)
GFR, EST NON AFRICAN AMERICAN: 12 mL/min — AB (ref >90)
Glucose, Bld: 109 mg/dL — ABNORMAL HIGH (ref 70–99)
Potassium: 3.9 mEq/L (ref 3.7–5.3)
Sodium: 140 mEq/L (ref 137–147)
Total Bilirubin: 0.3 mg/dL (ref 0.3–1.2)
Total Protein: 5.6 g/dL — ABNORMAL LOW (ref 6.0–8.3)

## 2013-11-04 LAB — PROTIME-INR
INR: 1.05 (ref 0.00–1.49)
Prothrombin Time: 13.5 seconds (ref 11.6–15.2)

## 2013-11-04 LAB — CBC
HCT: 22.6 % — ABNORMAL LOW (ref 36.0–46.0)
Hemoglobin: 7.3 g/dL — ABNORMAL LOW (ref 12.0–15.0)
MCH: 33.3 pg (ref 26.0–34.0)
MCHC: 32.3 g/dL (ref 30.0–36.0)
MCV: 103.2 fL — ABNORMAL HIGH (ref 78.0–100.0)
Platelets: 168 10*3/uL (ref 150–400)
RBC: 2.19 MIL/uL — ABNORMAL LOW (ref 3.87–5.11)
RDW: 19.5 % — AB (ref 11.5–15.5)
WBC: 4 10*3/uL (ref 4.0–10.5)

## 2013-11-04 LAB — PREPARE RBC (CROSSMATCH)

## 2013-11-04 LAB — TROPONIN I: Troponin I: <0.3 ng/mL (ref <0.30)

## 2013-11-04 LAB — TSH: TSH: 0.774 u[IU]/mL (ref 0.350–4.500)

## 2013-11-04 MED ORDER — METOPROLOL TARTRATE 25 MG PO TABS: 25.0000 mg | ORAL_TABLET | Freq: Two times a day (BID) | ORAL | Status: DC

## 2013-11-04 MED ORDER — HEPARIN SOD (PORK) LOCK FLUSH 100 UNIT/ML IV SOLN
500.0000 [IU] | INTRAVENOUS | Status: AC | PRN
  Administered 2013-11-04: 500 [IU]

## 2013-11-04 NOTE — Progress Notes (Addendum)
error 

## 2013-11-04 NOTE — Progress Notes (Signed)
Subjective:  Feels well Maintaining NSR No plans for anticoagulation as risk outweighs benefit Plans for a unit of blood to be given prior to D/C today noted Not able to get fistulagram scheduled for today in time for discharge (will schedule as outpt procedure)  Objective:    Vital signs in last 24 hours: Filed Vitals:   11/03/13 2143 11/04/13 0517 11/04/13 1021 11/04/13 1043  BP: 161/62 118/49 118/49   Pulse: 96 73 77 73  Temp: 98.2 F (36.8 C) 98.3 F (36.8 C)    TempSrc: Oral     Resp: 20 18    Height: 5\' 2"  (1.575 m)     Weight: 56.2 kg (123 lb 14.4 oz)     SpO2: 100% 100%  98%   Weight change:  No intake or output data in the 24 hours ending 11/04/13 1231  Physical Exam:  Blood pressure 118/49, pulse 73, temperature 98.3 F (36.8 C), temperature source Oral, resp. rate 18, height 5\' 2"  (1.575 m), weight 56.2 kg (123 lb 14.4 oz), SpO2 98.00%. Pale app WF, alert, oriented Lungs clear Mass in right parotid area firm not tender S1S2 No S3 2/6 murmur lsb->apex Right sided portacath in place Abd soft NT Trace-1+ edema  Left arm edematous AVF with +bruit/thrill; Left sided TDC in place Labs:   Recent Labs Lab 11/03/13 1731 11/04/13 0540  NA 138 140  K 3.0* 3.9  CL 96 97  CO2  --  30  GLUCOSE 187* 109*  BUN 5* 9  CREATININE 2.30* 3.43*  CALCIUM  --  8.3*     Recent Labs Lab 11/03/13 2255 11/04/13 0540  AST 19 20  ALT 10 9  ALKPHOS 63 62  BILITOT 0.3 0.3  PROT 5.7* 5.6*  ALBUMIN 2.5* 2.5*   No results found for this basename: LIPASE, AMYLASE,  in the last 168 hours No results found for this basename: AMMONIA,  in the last 168 hours   Recent Labs Lab 11/03/13 1655 11/03/13 1731 11/03/13 2251 11/04/13 0540  WBC 4.2  --  3.7* 4.0  NEUTROABS 1.8  --   --   --   HGB 8.7* 9.9* 7.4* 7.3*  HCT 27.2* 29.0* 23.3* 22.6*  MCV 101.9*  --  103.1* 103.2*  PLT 192  --  170 168    Recent Labs Lab 11/03/13 2255 11/04/13 0540  TROPONINI <0.30 <0.30     Studies/Results: Dg Chest Port 1 View  11/03/2013   CLINICAL DATA:  Tachycardia and chest pain.  EXAM: PORTABLE CHEST - 1 VIEW  COMPARISON:  10/01/2013  FINDINGS: Port-A-Cath and dialysis catheter show stable positioning. External pacing pad overlies the chest. The lungs show no evidence of edema, infiltrate, pleural effusion or pneumothorax. Cardiac and mediastinal contours are stable and within normal limits.  IMPRESSION: No active disease.   Electronically Signed   By: Aletta Edouard M.D.   On: 11/03/2013 17:46      . coumadin book   Does not apply Once  . [START ON 11/05/2013] darbepoetin (ARANESP) injection - DIALYSIS  150 mcg Intravenous Q Thu-HD  . enoxaparin (LOVENOX) injection  30 mg Subcutaneous Q24H  . fentaNYL  12.5 mcg Transdermal Q72H  . metoprolol tartrate  25 mg Oral BID  . multivitamin  1 tablet Oral QHS  . pantoprazole  40 mg Oral BID  . sevelamer carbonate  1,600 mg Oral TID WC  . sodium chloride  3 mL Intravenous Q12H     I  have reviewed scheduled  and prn medications.  ASSESSMENT/RECOMMENDATIONS   1. Afib-->NSR and maintaining.  No plans to start anticoagulation due to risk outweighing benefit. Cardizem d/c; Starting metoprolol; echo pending from today 2. ESRD: TTS HD. No urgent need for additional Rx today (missed about 30' yeseterday) . HD at [facility] tomorrow on schedule; will schedule f'gram at [facility] (arm edema; AVF infiltration) 3. Hypertension: control vol  4. Anemia of ESRD: use epo, Fe ok; Hb down around a gram; to get 1 unit prior to discharge  5. Metabolic Bone Disease: Use low dose Vit D  6. Lymphoma - missed her chemo today; will be rescheduled    Jamal Maes, MD [facility] [phone] Pager 11/04/2013, 12:31 PM

## 2013-11-04 NOTE — Discharge Instructions (Addendum)
Dialysis Diet °A dialysis diet is a special diet when you start peritoneal dialysis or hemodialysis. Foods must be chosen carefully because different foods produce different wastes in your blood. If you are on dialysis treatment, that means your kidneys have stopped working properly on their own. This means the kidneys are removing very little or no wastes from your blood. Dialysis can perform the function of a healthy kidney and filter wastes from your body. However, between dialysis sessions, wastes build up in your blood and can make you sick. You can reduce the amount of wastes that build up in your blood by watching what you eat and drink. A good meal plan can improve your dialysis and your health. Your dietitian or renal dietitian can help you plan meals.  °FLUIDS °In between dialysis sessions, your kidneys may be able to remove some fluid or none at all. Fluid can build up and cause swelling and weight gain. The extra fluid affects your blood pressure and can make your heart work harder. Overloading your body with fluid could lead to serious heart trouble. Every person on dialysis has a different amount of fluid that they can have each day. Talk to your dietitian about how much fluid you can have each day and write that down.  °Foods that add to your fluid intake include: °· Foods that contain liquid at room temperature, such as soup, gelatin dessert, and ice cream. °· Fruits and vegetables, such as melons, grapes, apples, oranges, tomatoes, lettuce, and celery. °Your dietitian will be able to give you other tips for managing your thirst. °POTASSIUM °Potassium is a mineral found in many foods, especially milk, fruits, and vegetables. It affects how steadily your heart beats. Potassium levels can rise between dialysis sessions and can affect your heartbeat and may even cause death.  °Avoid foods high in potassium. If you do eat high-potassium foods, eat smaller portions. For example, eat half a pear instead of  a whole pear. Eat only very small portions of oranges and melons. You can remove some of the potassium from potatoes and other vegetables by peeling them and then soaking them in a large amount of water for several hours. Drain and rinse them before cooking. Talk to a dietitian about foods you can eat instead of high-potassium foods. °High-potassium foods and drinks include: °· Apricots. °· Brussels sprouts. °· Dates. °· Lima beans. °· Oranges. °· Prune juice. °· Spinach. °· Avocados. °· Milk. °· Figs. °· Melons. °· Peanuts. °· Prunes. °· Tomatoes. °· Bananas. °· Cantaloupe. °· Kiwi fruit. °· Nectarines. °· Asparagus spears. °· Raisins. °· Winter squash. °· Beets. °· Clams. °· Orange juice. °· Potatoes. °· Sardines. °· Yogurt. °PHOSPHORUS °Phosphorus is a mineral found in many foods. If you have too much phosphorus in your blood, your bones lose calcium. Losing calcium will make your bones weak and more likely to break. Also, too much phosphorus may make your skin itch. Food high in phosphorus include milk, cheese, dried beans, peas, colas, nuts, and peanut butter. Avoid eating too much of these foods. Usually, people on dialysis are limited to ½ cup of milk per day. Talk to a dietitian about foods you can eat instead of high-phosphorus foods. °PROTEIN °You may be encouraged to eat as much "high-quality protein" as you can. High-quality proteins come from meat, fish, poultry, and eggs. Choose low-fat (lean) meats that are also low in phosphorus. If you are a vegetarian, ask your dietitian about other ways to get your protein. Low-fat milk   much "high-quality protein" as you can. High-quality proteins come from meat, fish, poultry, and eggs. Choose low-fat (lean) meats that are also low in phosphorus. If you are a vegetarian, ask your dietitian about other ways to get your protein. Low-fat milk is a good source of protein, but milk is high in phosphorus and potassium and adds to your fluid intake. Talk to a dietitian to see if milk fits into your food plan.  SODIUM  Sodium makes you thirsty, and if you are on dialysis, you must restrict how much fluid you drink. Therefore, try to eat fresh foods that are naturally low in sodium. Stay away from canned foods and frozen  dinners. Look for products labeled "low sodium." Do not use salt substitutes because they contain potassium. Talk to your dietitian about foods and spice blends without sodium or potassium.   CALORIES  Calories come from food and provide energy for your body. Your dietitian may recommend adding or cutting down on the calories you eat depending on if you need to lose or gain weight. Talk to a dietitian about foods you can eat to either gain or lose weight.  VITAMINS AND MINERALS  Your caregiver may prescribe a vitamin and mineral supplement. Vitamins and minerals may be missing from your diet because you have to avoid so many foods. Talk to your dietitian or kidney doctor before choosing a supplement. Many vitamin or mineral supplements sold on the store shelf may be harmful to you.  Document Released: 07/12/2004 Document Revised: 04/15/2012 Document Reviewed: 12/25/2011  [employer]® Patient Information ©2014 [employer].

## 2013-11-04 NOTE — Discharge Summary (Signed)
Physician Discharge Summary  [name] DZH:[id number] DOB: 07-Mar-1941 DOA: 11/03/2013  PCP: Diana Smolder, MD  Admit date: 11/03/2013 Discharge date: 11/04/2013  Time spent: greater than 30 min  Recommendations for Outpatient Follow-up:  1. Monitor CBC 2. Monitor heart rate, rhythm  Discharge Diagnoses:  Principal Problem:   Atrial fibrillation with RVR Active Problems:   Large cell (diffuse) non-Hodgkin's lymphoma   Anemia of renal disease   Type II or unspecified type diabetes mellitus with ketoacidosis, not stated as uncontrolled   ESRD on hemodialysis   Discharge Condition: *stable  Filed Weights   11/03/13 1631 11/03/13 2143  Weight: 59.875 kg (132 lb) 56.2 kg (123 lb 14.4 oz)    History of present illness:  73 year old female with a history of non-Hodgkin's lymphoma, status post 3 cycles of gemcitabine/oxaliplatin/Rituxan, due for next chemotherapy tomorrow, Presents to the ER with a heart rate of 144, in SVT/atrial flutter that started during dialysis today. She received adenosine in the ER, and was subsequently started on Cardizem drip and converted to normal sinus rhythm. She was symptomatic with chest pain and shortness of breath, and had to stop dialysis. She had 30 minutes left to complete her session. She has otherwise been feeling well with no intercurrent illness.  Family states that patient stays lightheaded because of poor oral intake, more so because of nausea related to the chemotherapy, and dialysis. She has had no recent falls .  Patient has a history of atrial fibrillation several years ago and used to be on Pradaxa, which was discontinued because of resolution of atrial fibrillation.  She had cardiac catheterization in 2011. Followed closely by Dr. [name].   Hospital Course:   Admitted to hospitalists, with cardiology and nephrology consulting.  Patient had no return of tachycardia.  Beta blocker will be continued.  Patient was anemic and weak, without  signs of bleeding.  Transfused 1 unti PRBC. With ESRD and lymphoma requiring chemo, anticoagulation felt to be too high risk Procedures:    Consultations:  Cardiology  nephrology  Discharge Exam: Filed Vitals:   11/04/13 0517  BP: 118/49  Pulse: 73  Temp: 98.3 F (36.8 C)  Resp: 18    General: comfortable Cardiovascular: RRR Respiratory: CTA  Discharge Instructions  Discharge Orders   Future Appointments Provider Department Dept Phone   11/04/2013 12:45 PM Diana Napoleon, MD [place] [phone]   11/11/2013 8:00 AM [facility] [phone]   Pt should arrive15 minutes prior to scheduled appt time. Please inform patient that exam will take a minimum of 1 1/2 hours. Patient to be NPO 6 hours prior to exam  and should not take any insulin the day of exam.   11/16/2013 7:45 AM [place] [phone]   11/16/2013 8:00 AM Diana Napoleon, MD [place] [phone]   11/16/2013 8:30 AM [facility] [place] [facility] [place] [phone]   Future Orders Complete By Expires   Diet - low sodium heart healthy  As directed    Increase activity slowly  As directed        Medication List         ALPRAZolam 0.5 MG tablet  Commonly known as:  XANAX  Take 0.5 mg by mouth. 1-2 tabs by mouth every 8 hours as needed for anxiety.     dexamethasone 4 MG tablet  Commonly  known as:  DECADRON  Take 2 tabs daily with a meal for 3 days starting the day after chemo.     DSS 100 MG Caps  Take 200 mg by mouth 3 (three) times daily as needed for constipation.     esomeprazole 40 MG capsule  Commonly known as:  NEXIUM  Take 40 mg by mouth daily.     famciclovir 125 MG tablet  Commonly known as:  FAMVIR  Take 1 tablet on T-Th-Sat AFTER dialysis     fentaNYL 12 MCG/HR  Commonly known as:  DURAGESIC - dosed mcg/hr   Place 1 patch (12.5 mcg total) onto the skin every 3 (three) days.     lidocaine-prilocaine cream  Commonly known as:  EMLA  Apply 1 application topically as needed (for port-a-cath access).     loratadine 10 MG tablet  Commonly known as:  CLARITIN  Take 10 mg by mouth daily as needed for allergies.     metoprolol tartrate 25 MG tablet  Commonly known as:  LOPRESSOR  Take 1 tablet (25 mg total) by mouth 2 (two) times daily.     multivitamin Tabs tablet  Take 1 tablet by mouth daily.     ondansetron 8 MG disintegrating tablet  Commonly known as:  ZOFRAN-ODT  Take 1 tab twice daily starting the day after chemo x 3 days then twice daily as needed for nausea     OVER THE COUNTER MEDICATION  Take 5 mLs by mouth 2 (two) times daily at 10 AM and 5 PM. creamotion cough syrup     oxyCODONE-acetaminophen 5-325 MG per tablet  Commonly known as:  PERCOCET/ROXICET  Take 1-2 tablets by mouth every 4 (four) hours as needed for moderate pain.     RENVELA 800 MG tablet  Generic drug:  sevelamer carbonate  Take 800 mg by mouth 3 (three) times daily with meals.     traMADol 50 MG tablet  Commonly known as:  ULTRAM  Take 50 mg by mouth every 6 (six) hours as needed for moderate pain.       Allergies  Allergen Reactions  . Advicor [Niacin-Lovastatin Er]     Muscle cramps  . Crestor [Rosuvastatin]     Muscle cramps  . Lipitor [Atorvastatin]     Muscle cramps  . Lovastatin     Muscle cramps  . Morphine And Related Other (See Comments)    "made me crazy"  . Niaspan [Niacin Er]     Muscle cramps  . Nsaids     Gi side effects  . Pravachol [Pravastatin Sodium]     Gi side effects  . Sulfa Antibiotics Other (See Comments)    Can't remember   . Vytorin [Ezetimibe-Simvastatin] Nausea Only  . Welchol [Colesevelam Hcl]     Muscle cramps  . Zetia [Ezetimibe]     Muscle cramps   . Zocor [Simvastatin]     Muscle cramps       The results of significant diagnostics from this  hospitalization (including imaging, microbiology, ancillary and laboratory) are listed below for reference.    Significant Diagnostic Studies: Dg Chest Port 1 View  11/03/2013   CLINICAL DATA:  Tachycardia and chest pain.  EXAM: PORTABLE CHEST - 1 VIEW  COMPARISON:  10/01/2013  FINDINGS: Port-A-Cath and dialysis catheter show stable positioning. External pacing pad overlies the chest. The lungs show no evidence of edema, infiltrate, pleural effusion or pneumothorax. Cardiac and mediastinal contours are stable and within normal limits.  IMPRESSION: No  active disease.   Electronically Signed   By: Aletta Edouard M.D.   On: 11/03/2013 17:46   Ct Maxillofacial Wo Cm  10/07/2013   CLINICAL DATA:  Facial swelling.  EXAM: CT MAXILLOFACIAL WITHOUT CONTRAST  TECHNIQUE: Multidetector CT imaging of the maxillofacial structures was performed. Multiplanar CT image reconstructions were also generated. A small metallic BB was placed on the right temple in order to reliably differentiate right from left.  COMPARISON:  09/14/2013 PET-CT.  FINDINGS: Enlarging right parotid mass now measuring 3.6 x 2.4 cm.  The paranasal sinuses and visualized mastoid air cells are clear. The middle ear cavities are clear. The globes are intact.  The visualized portion of the brain is unremarkable.  IMPRESSION: Enlarging right parotid mass.   Electronically Signed   By: Kalman Jewels M.D.   On: 10/07/2013 17:18    Microbiology: No results found for this or any previous visit (from the past 240 hour(s)).   Labs: Basic Metabolic Panel:  Recent Labs Lab 11/03/13 1731 11/03/13 2255 11/04/13 0540  NA 138  --  140  K 3.0*  --  3.9  CL 96  --  97  CO2  --   --  30  GLUCOSE 187*  --  109*  BUN 5*  --  9  CREATININE 2.30*  --  3.43*  CALCIUM  --   --  8.3*  MG  --  1.9  --    Liver Function Tests:  Recent Labs Lab 11/03/13 2255 11/04/13 0540  AST 19 20  ALT 10 9  ALKPHOS 63 62  BILITOT 0.3 0.3  PROT 5.7* 5.6*   ALBUMIN 2.5* 2.5*   No results found for this basename: LIPASE, AMYLASE,  in the last 168 hours No results found for this basename: AMMONIA,  in the last 168 hours CBC:  Recent Labs Lab 11/03/13 1655 11/03/13 1731 11/03/13 2251 11/04/13 0540  WBC 4.2  --  3.7* 4.0  NEUTROABS 1.8  --   --   --   HGB 8.7* 9.9* 7.4* 7.3*  HCT 27.2* 29.0* 23.3* 22.6*  MCV 101.9*  --  103.1* 103.2*  PLT 192  --  170 168   Cardiac Enzymes:  Recent Labs Lab 11/03/13 2255 11/04/13 0540  TROPONINI <0.30 <0.30   BNP: BNP (last 3 results)  Recent Labs  11/03/13 1655  PROBNP 6465.0*   CBG: No results found for this basename: GLUCAP,  in the last 168 hours   ekg Supraventricular tachycardia RBBB and LPFB ST depression, probably rate related  Signed:  Hermena Swint L  [facility] 11/04/2013, 10:17 AM

## 2013-11-04 NOTE — Progress Notes (Signed)
  Echocardiogram 2D Echocardiogram has been performed.  [name] 11/04/2013, 10:33 AM

## 2013-11-04 NOTE — Progress Notes (Signed)
Chart reviewed. Patient examined. Patient would like to go home today. Discussed with Drs. [name] and [place]. Remains in sinus rhythm. Will discontinue Coumadin as risk outweighs benefit. Discontinue Cardizem drip. Metoprolol started by Dr. [name]. Transfuse 1 unit of packed red blood cells. For dialysis tomorrow. Shuntogram as an outpatient per Dr. Lorrene Reid. Home after transfusion.

## 2013-11-04 NOTE — Progress Notes (Signed)
[facility]  Telephone:([phone]) [place] NOTE  Events since 1/6 noted. Heart rate now controlled, from Afib with RVR to NSR after adenosine and Cardizem drip on admission, now on Lopressor 25 mg bid for rate and rhythm, and coumadin last night per pharmacy. 2 D echo pending. Marland Kitchen No chest pain. Denies shortness of breath. No GI complaints.  Wants to go home    MEDICATIONS:  . coumadin book   Does not apply Once  . [START ON 11/05/2013] darbepoetin (ARANESP) injection - DIALYSIS  150 mcg Intravenous Q Thu-HD  . enoxaparin (LOVENOX) injection  30 mg Subcutaneous Q24H  . fentaNYL  12.5 mcg Transdermal Q72H  . metoprolol tartrate  25 mg Oral BID  . multivitamin  1 tablet Oral QHS  . pantoprazole  40 mg Oral BID  . sevelamer carbonate  1,600 mg Oral TID WC  . sodium chloride  3 mL Intravenous Q12H  . valACYclovir  1,000 mg Oral Daily  . warfarin   Does not apply Once  . Warfarin - Pharmacist Dosing Inpatient   Does not apply q1800   acetaminophen, acetaminophen, ALPRAZolam, levalbuterol, loratadine, ondansetron (ZOFRAN) IV, ondansetron, oxyCODONE-acetaminophen, traMADol  ALLERGIES:   Allergies  Allergen Reactions  . Advicor [Niacin-Lovastatin Er]     Muscle cramps  . Crestor [Rosuvastatin]     Muscle cramps  . Lipitor [Atorvastatin]     Muscle cramps  . Lovastatin     Muscle cramps  . Morphine And Related Other (See Comments)    "made me crazy"  . Niaspan [Niacin Er]     Muscle cramps  . Nsaids     Gi side effects  . Pravachol [Pravastatin Sodium]     Gi side effects  . Sulfa Antibiotics Other (See Comments)    Can't remember   . Vytorin [Ezetimibe-Simvastatin] Nausea Only  . Welchol [Colesevelam Hcl]     Muscle cramps  . Zetia [Ezetimibe]     Muscle cramps   . Zocor [Simvastatin]     Muscle cramps      PHYSICAL EXAMINATION:   Filed Vitals:   11/04/13 0517  BP: 118/49  Pulse: 73  Temp: 98.3 F (36.8 C)  Resp: 18   Filed  Weights   11/03/13 1631 11/03/13 2143  Weight: 132 lb (59.875 kg) 123 lb 14.4 oz (56.70 kg)    73 year old  in no acute distress A. and O. x3 General well-developed and well-nourished  HEENT: Normocephalic, atraumatic, PERRLA. Oral cavity without thrush or lesions. Neck supple. no thyromegaly, right parotid area with large  palpable mass of about 8-9 cm with radiation changes at the region.  Lungs clear bilaterally . No wheezing,minimal  Rhonchi, no  rales. Cardiac: regular rate and [id number] systolic  murmur , rubs or gallops Abdomen soft nontender , bowel sounds x4. No HSM Extremities no clubbing cyanosis , 1 + edema bilaterally. No bruising or petechial rash Neuro: non focal  LABORATORY/RADIOLOGY DATA:   Recent Labs Lab 11/03/13 1655 11/03/13 1731 11/03/13 2251 11/04/13 0540  WBC 4.2  --  3.7* 4.0  HGB 8.7* 9.9* 7.4* 7.3*  HCT 27.2* 29.0* 23.3* 22.6*  PLT 192  --  170 168  MCV 101.9*  --  103.1* 103.2*  MCH 32.6  --  32.7 33.3  MCHC 32.0  --  31.8 32.3  RDW 18.9*  --  19.1* 19.5*  LYMPHSABS 1.2  --   --   --   MONOABS 1.1*  --   --   --  EOSABS 0.1  --   --   --   BASOSABS 0.0  --   --   --     CMP    Recent Labs Lab 11/03/13 1731 11/03/13 2255 11/04/13 0540  NA 138  --  140  K 3.0*  --  3.9  CL 96  --  97  CO2  --   --  30  GLUCOSE 187*  --  109*  BUN 5*  --  9  CREATININE 2.30*  --  3.43*  CALCIUM  --   --  8.3*  MG  --  1.9  --   AST  --  19 20  ALT  --  10 9  ALKPHOS  --  63 62  BILITOT  --  0.3 0.3        Component Value Date/Time   BILITOT 0.3 11/04/2013 0540   BILITOT 0.50 10/19/2013 0806   BILIDIR <0.2 11/03/2013 2255   IBILI NOT CALCULATED 11/03/2013 2255      Recent Labs Lab 11/04/13 0540  INR 1.05      Urinalysis    Component Value Date/Time   COLORURINE YELLOW 03/19/2013 1320   APPEARANCEUR CLEAR 03/19/2013 1320   LABSPEC 1.011 03/19/2013 1320   PHURINE 7.0 03/19/2013 1320   GLUCOSEU 250* 03/19/2013 1320   HGBUR SMALL*  03/19/2013 1320   BILIRUBINUR NEGATIVE 03/19/2013 1320   KETONESUR NEGATIVE 03/19/2013 1320   PROTEINUR 100* 03/19/2013 1320   UROBILINOGEN 0.2 03/19/2013 1320   NITRITE NEGATIVE 03/19/2013 1320   LEUKOCYTESUR NEGATIVE 03/19/2013 1320     Liver Function Tests:  Recent Labs Lab 11/03/13 2255 11/04/13 0540  AST 19 20  ALT 10 9  ALKPHOS 63 62  BILITOT 0.3 0.3  PROT 5.7* 5.6*  ALBUMIN 2.5* 2.5*    Cardiac Enzymes:  Recent Labs Lab 11/03/13 2255 11/04/13 0540  TROPONINI <0.30 <0.30    Radiology Studies:  Dg Chest Port 1 View  11/03/2013   CLINICAL DATA:  Tachycardia and chest pain.  EXAM: PORTABLE CHEST - 1 VIEW  COMPARISON:  10/01/2013  FINDINGS: Port-A-Cath and dialysis catheter show stable positioning. External pacing pad overlies the chest. The lungs show no evidence of edema, infiltrate, pleural effusion or pneumothorax. Cardiac and mediastinal contours are stable and within normal limits.  IMPRESSION: No active disease.   Electronically Signed   By: Aletta Edouard M.D.   On: 11/03/2013 17:46   Ct Maxillofacial Wo Cm  10/07/2013   CLINICAL DATA:  Facial swelling.  EXAM: CT MAXILLOFACIAL WITHOUT CONTRAST  TECHNIQUE: Multidetector CT imaging of the maxillofacial structures was performed. Multiplanar CT image reconstructions were also generated. A small metallic BB was placed on the right temple in order to reliably differentiate right from left.  COMPARISON:  09/14/2013 PET-CT.  FINDINGS: Enlarging right parotid mass now measuring 3.6 x 2.4 cm.  The paranasal sinuses and visualized mastoid air cells are clear. The middle ear cavities are clear. The globes are intact.  The visualized portion of the brain is unremarkable.  IMPRESSION: Enlarging right parotid mass.   Electronically Signed   By: Kalman Jewels M.D.   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 The patient is status post 3 cycles of gemcitabine/oxaliplatin/Rituxan.  She was due for cycle 4 today, which is to be placed on hold at this time due to hospitalization. A  PET scan is to be arranged next week to determine response to chemotherapy. If the area in the right parotid region is hypermetabolic, then radiation therapy to this region is to be considered since the patient already had radiation to the same area. 2 Atrial Fibrillation, now converted to NSR. On Lopressor for rate and rhythm. Appreciate admitting team and Pharmacy involvement. 2 D echo pending.  On Coumadin 5mg  PO per pharmacy. Lovenox 30 mg sq q 24 hrs given until INR is therapeutic for goal 2-3, unles otherwise indicated . 3. ESRD, on HD Last attempt on 1/6, aborted after patient developed cardiac symptoms. Plans as Nephrology 4. Anemia, on Epo per Renal. Monitor closely, No obvious bleeding, Hb dropped from 8.7  to 7.3. She is scheduled  to receive 2 Units of blood with HD tomorrow, 11/05/13 per renal.   Sharene Butters E, PA-C 11/04/2013, 8:10 AM

## 2013-11-04 NOTE — Telephone Encounter (Signed)
Pt in hospital per MD pt aware of 1-14 tx, 1-21 PET to be NPO 6 hrs and 1-28 tx appointments

## 2013-11-04 NOTE — Progress Notes (Signed)
Subjective:  She has had no recurrence of atrial fibrillation or flutter overnight. She is received some beta blockers and also is on intravenous diltiazem now. Dr.Deterding's note appreciated. She wants to go home. No shortness of breath or chest pain.  Objective:  Vital Signs in the last 24 hours: BP 118/49  Pulse 73  Temp(Src) 98.3 F (36.8 C) (Oral)  Resp 18  Ht 5\' 2"  (1.575 m)  Wt 56.2 kg (123 lb 14.4 oz)  BMI 22.66 kg/m2  SpO2 100%  Physical Exam: Pleasant female in no acute distress Lungs:  Clear Cardiac:  Regular rhythm, normal S1 and S2, no S3 Abdomen:  Soft, nontender, no masses Extremities:  No edema present  Intake/Output from previous day:    Weight Filed Weights   11/03/13 1631 11/03/13 2143  Weight: 59.875 kg (132 lb) 56.2 kg (123 lb 14.4 oz)    Lab Results: Basic Metabolic Panel:  Recent Labs  11/03/13 1731 11/04/13 0540  NA 138 140  K 3.0* 3.9  CL 96 97  CO2  --  30  GLUCOSE 187* 109*  BUN 5* 9  CREATININE 2.30* 3.43*   CBC:  Recent Labs  11/03/13 1655  11/03/13 2251 11/04/13 0540  WBC 4.2  --  3.7* 4.0  NEUTROABS 1.8  --   --   --   HGB 8.7*  < > 7.4* 7.3*  HCT 27.2*  < > 23.3* 22.6*  MCV 101.9*  --  103.1* 103.2*  PLT 192  --  170 168  < > = values in this interval not displayed.  Cardiac Enzymes:  Recent Labs  11/03/13 2255 11/04/13 0540  TROPONINI <0.30 <0.30    Telemetry: Normal sinus rhythm, no recurrence of atrial fibrillation  Assessment/Plan:  1. Paroxysmal atrial flutter/fibrillation that has resolved. She has not had this in some time. She is currently receiving salvage chemotherapy and one of the agents that she is on has been associated with atrial arrhythmias.  Anticoagulation may be problematic because she has renal failure and also is receiving chemotherapy. The risks of this need to be carefully assessed for potential benefits for stroke prevention and would suggest consultation with the nephrologist as  well as her regular oncologist.  I would stop her diltiazem now and have her ambulate. Check echocardiogram. If she remains stable I think she can go home today on beta blocker therapy and I will see her in followup in one to 2 weeks.      Kerry Hough  MD [facility]  11/04/2013, 9:21 AM

## 2013-11-04 NOTE — ED Provider Notes (Signed)
I have personally seen and examined the patient.  I have discussed the plan of care with the resident.  I have reviewed the documentation on PMH/FH/Soc. History.  I have reviewed the documentation of the resident and agree.  I have reviewed and agree with the ECG interpretation(s) documented by the resident.  CRITICAL CARE Performed by: Sharyon Cable Total critical care time: 35 Critical care time was exclusive of separately billable procedures and treating other patients. Critical care was necessary to treat or prevent imminent or life-threatening deterioration. Critical care was time spent personally by me on the following activities: development of treatment plan with patient and/or surrogate as well as nursing, discussions with consultants, evaluation of patient's response to treatment, examination of patient, obtaining history from patient or surrogate, ordering and performing treatments and interventions, ordering and review of laboratory studies, ordering and review of radiographic studies, pulse oximetry and re-evaluation of patient's condition.  Pt given adenosine without successful cardioversion.  She was given cardizem (she was checked on multiple times, she was improving) and her HR improved.  I spoke to on call cardiologist dr [name] who requested that she be admitted to medical service.  PT stabilized in the ER   Sharyon Cable, MD 11/04/13 [phone]

## 2013-11-04 NOTE — Evaluation (Addendum)
Physical Therapy Evaluation Patient Details Name: [name] MRN: [id number] DOB: 21-Jun-1941 Today's Date: 11/04/2013 Time: 1829-9371 PT Time Calculation (min): 33 min  PT Assessment / Plan / Recommendation History of Present Illness  73 y.o. female admitted to [facility] on 11/03/13 from hemodialysis due to heart rate of 144, in SVT/atrial flutter.  Of note, she has signficant PMHx of non-Hodgkin's lymphoma, status post 3 cycles of gemcitabine/oxaliplatin/Rituxan, ESRD, anemia of chronic disease, HTN, CAD, and DM2.    Clinical Impression  Pt is progressing well with mobility.  HR stable during activity.  Pt is generally weak, but this is likely a combination of chronic anemia, chemo, and ESRD requiring dialysis.  HEP give for leg strengthening.  We discussed HHPT, but both agree that she is doing well at this point and does not need it at this time.  She agreed to do her leg exercises regularly.   PT to follow acutely for deficits listed below.     PT Assessment  Patient needs continued PT services    Follow Up Recommendations  No PT follow up;Supervision - Intermittent    Does the patient have the potential to tolerate intense rehabilitation     NA  Barriers to Discharge   None      Equipment Recommendations  None recommended by PT    Recommendations for Other Services   None  Frequency Min 3X/week    Precautions / Restrictions   None  Pertinent Vitals/Pain See vitals flow sheet.      Mobility  Bed Mobility  Overal bed mobility Needs Assistance  Bed Mobility Supine to Sit  Supine to sit Modified independent (Device/Increase time);HOB elevated (with railing)  General bed mobility comments using railing for leverage   Transfers  Overall transfer level Modified independent  Equipment used Rolling walker (2 wheeled)  General transfer comment verbal cues for safe hand placement, reliance on upper extremity assist during transitions.     Ambulation/Gait Ambulation Distance  (Feet): 120 Feet Gait velocity: decreased General Gait Details: pt reports legs quivering by the end of the walk indicating limited endurace.  She report using the seat on her rollator frequently.      Exercises General Exercises - Lower Extremity Long Arc Quad: AROM;Both;10 reps;Seated Hip ABduction/ADduction: AROM;Both;10 reps;Seated (adduction only against pillow for resistance. ) Hip Flexion/Marching: AROM;Both;10 reps;Seated Toe Raises: AROM;Both;10 reps;Seated Heel Raises: AROM;Both;10 reps;Seated Other Exercises Other Exercises: HEP sheet given of exercises listed above with instructions on frequency and progression.     PT Diagnosis: Difficulty walking;Abnormality of gait;Generalized weakness  PT Problem List: Decreased strength;Decreased activity tolerance;Decreased balance;Decreased mobility PT Treatment Interventions: DME instruction;Gait training;Stair training;Therapeutic activities;Functional mobility training;Therapeutic exercise;Balance training;Neuromuscular re-education;Patient/family education     PT Goals(Current goals can be found in the care plan section) Acute Rehab PT Goals Patient Stated Goal: to go home today PT Goal Formulation: With patient Time For Goal Achievement: 11/18/13 Potential to Achieve Goals: Good  Visit Information  Last PT Received On: 11/04/13 Assistance Needed: +1 Reason Eval/Treat Not Completed: Patient at procedure or test/unavailable History of Present Illness: 73 y.o. female admitted to [facility] on 11/03/13 from hemodialysis due to heart rate of 144, in SVT/atrial flutter.  Of note, she has signficant PMHx of non-Hodgkin's lymphoma, status post 3 cycles of gemcitabine/oxaliplatin/Rituxan, ESRD, anemia of chronic disease, HTN, CAD, and DM2.         Prior [place] expects to be discharged to:: Private residence Living Arrangements: Spouse/significant other Available Help at Discharge:  Family;Available 24  hours/day (supervision as husband does not get around so well ) Type of Home: House Home Access: Stairs to enter [employer] of Steps: 1 Entrance Stairs-Rails: None Home Layout: One level Home Equipment: Walker - 4 wheels Prior Function Level of Independence: Independent with assistive device(s) Comments: ambulates with rollator due to generalized weakness from the CA tx.   Communication Communication: No difficulties    Cognition  Cognition Arousal/Alertness: Awake/alert Behavior During Therapy: WFL for tasks assessed/performed Overall Cognitive Status: Within Functional Limits for tasks assessed Memory: Decreased short-term memory (pt self reports this, daughter does her bills)    Extremity/Trunk Assessment Upper Extremity Assessment Upper Extremity Assessment: Overall WFL for tasks assessed (h/o R RTC repair) Lower Extremity Assessment Lower Extremity Assessment: Generalized weakness Cervical / Trunk Assessment Cervical / Trunk Assessment: Normal      End of Session PT - End of Session Equipment Utilized During Treatment: Gait belt Activity Tolerance: Patient limited by fatigue Patient left: in chair;with call bell/phone within reach  GP Functional Assessment Tool Used: assist level Functional Limitation: Mobility: Walking and moving around Mobility: Walking and Moving Around Current Status ([id number]): At least 1 percent but less than 20 percent impaired, limited or restricted Mobility: Walking and Moving Around Goal Status [phone]): 0 percent impaired, limited or restricted   Malesha Suliman B. [place], [place], DPT [phone]   11/04/2013, 11:21 AM

## 2013-11-04 NOTE — Progress Notes (Signed)
PT Cancellation Note  Patient Details Name: [name] MRN: [id number] DOB: 11/18/1940   Cancelled Treatment:    Reason Eval/Treat Not Completed: Patient at procedure or test/unavailable.  Echo in room, PT to check back later this AM.    Thanks,   Wells Guiles B. [place], [place], DPT [phone]   11/04/2013, 10:04 AM

## 2013-11-05 LAB — TYPE AND SCREEN
ABO/RH(D): A POS
Antibody Screen: NEGATIVE
Unit division: 0

## 2013-11-11 ENCOUNTER — Ambulatory Visit (HOSPITAL_BASED_OUTPATIENT_CLINIC_OR_DEPARTMENT_OTHER): Payer: Medicare Other

## 2013-11-11 ENCOUNTER — Other Ambulatory Visit: Payer: Self-pay | Admitting: Hematology & Oncology

## 2013-11-11 ENCOUNTER — Ambulatory Visit (HOSPITAL_BASED_OUTPATIENT_CLINIC_OR_DEPARTMENT_OTHER): Payer: Medicare Other | Admitting: Hematology & Oncology

## 2013-11-11 ENCOUNTER — Encounter: Payer: Self-pay | Admitting: Hematology & Oncology

## 2013-11-11 ENCOUNTER — Other Ambulatory Visit (HOSPITAL_BASED_OUTPATIENT_CLINIC_OR_DEPARTMENT_OTHER): Payer: Medicare Other | Admitting: Lab

## 2013-11-11 ENCOUNTER — Encounter (HOSPITAL_COMMUNITY): Payer: Medicare Other

## 2013-11-11 VITALS — BP 137/60 | HR 74 | Temp 98.2°F | Resp 16

## 2013-11-11 VITALS — BP 132/54 | HR 64 | Temp 98.2°F | Resp 14 | Ht 63.0 in | Wt 129.0 lb

## 2013-11-11 DIAGNOSIS — C8589 Other specified types of non-Hodgkin lymphoma, extranodal and solid organ sites: Secondary | ICD-10-CM

## 2013-11-11 DIAGNOSIS — I4891 Unspecified atrial fibrillation: Secondary | ICD-10-CM

## 2013-11-11 DIAGNOSIS — Z5111 Encounter for antineoplastic chemotherapy: Secondary | ICD-10-CM

## 2013-11-11 DIAGNOSIS — C8330 Diffuse large B-cell lymphoma, unspecified site: Secondary | ICD-10-CM

## 2013-11-11 DIAGNOSIS — Z5112 Encounter for antineoplastic immunotherapy: Secondary | ICD-10-CM

## 2013-11-11 DIAGNOSIS — N19 Unspecified kidney failure: Secondary | ICD-10-CM

## 2013-11-11 DIAGNOSIS — Z992 Dependence on renal dialysis: Secondary | ICD-10-CM

## 2013-11-11 LAB — LACTATE DEHYDROGENASE: LDH: 227 U/L (ref 94–250)

## 2013-11-11 LAB — CBC WITH DIFFERENTIAL (CANCER CENTER ONLY)
BASO#: 0 10*3/uL (ref 0.0–0.2)
BASO%: 0.8 % (ref 0.0–2.0)
EOS%: 2.6 % (ref 0.0–7.0)
Eosinophils Absolute: 0.1 10*3/uL (ref 0.0–0.5)
HCT: 33.3 % — ABNORMAL LOW (ref 34.8–46.6)
HGB: 10.3 g/dL — ABNORMAL LOW (ref 11.6–15.9)
LYMPH#: 1.9 10*3/uL (ref 0.9–3.3)
LYMPH%: 50.4 % — AB (ref 14.0–48.0)
MCH: 31.3 pg (ref 26.0–34.0)
MCHC: 30.9 g/dL — ABNORMAL LOW (ref 32.0–36.0)
MCV: 101 fL (ref 81–101)
MONO#: 1.2 10*3/uL — AB (ref 0.1–0.9)
MONO%: 32.7 % — ABNORMAL HIGH (ref 0.0–13.0)
NEUT#: 0.5 10*3/uL — CL (ref 1.5–6.5)
NEUT%: 13.5 % — ABNORMAL LOW (ref 39.6–80.0)
PLATELETS: 175 10*3/uL (ref 145–400)
RBC: 3.29 10*6/uL — AB (ref 3.70–5.32)
RDW: 18.4 % — ABNORMAL HIGH (ref 11.1–15.7)
WBC: 3.8 10*3/uL — AB (ref 3.9–10.0)

## 2013-11-11 LAB — CMP (CANCER CENTER ONLY)
ALT(SGPT): 16 U/L (ref 10–47)
AST: 20 U/L (ref 11–38)
Albumin: 3 g/dL — ABNORMAL LOW (ref 3.3–5.5)
Alkaline Phosphatase: 72 U/L (ref 26–84)
BUN, Bld: 17 mg/dL (ref 7–22)
CALCIUM: 8.7 mg/dL (ref 8.0–10.3)
CHLORIDE: 93 meq/L — AB (ref 98–108)
CO2: 35 mEq/L — ABNORMAL HIGH (ref 18–33)
Creat: 3.7 mg/dl (ref 0.6–1.2)
Glucose, Bld: 115 mg/dL (ref 73–118)
POTASSIUM: 4.4 meq/L (ref 3.3–4.7)
SODIUM: 141 meq/L (ref 128–145)
TOTAL PROTEIN: 6.7 g/dL (ref 6.4–8.1)
Total Bilirubin: 0.4 mg/dl (ref 0.20–1.60)

## 2013-11-11 LAB — PREALBUMIN: Prealbumin: 27.1 mg/dL (ref 17.0–34.0)

## 2013-11-11 MED ORDER — KETOROLAC TROMETHAMINE 15 MG/ML IJ SOLN
INTRAMUSCULAR | Status: AC
  Filled 2013-11-11: qty 2

## 2013-11-11 MED ORDER — SODIUM CHLORIDE 0.9 % IV SOLN
375.0000 mg/m2 | Freq: Once | INTRAVENOUS | Status: AC
  Administered 2013-11-11: 600 mg via INTRAVENOUS
  Filled 2013-11-11: qty 60

## 2013-11-11 MED ORDER — ALTEPLASE 2 MG IJ SOLR
2.0000 mg | Freq: Once | INTRAMUSCULAR | Status: DC | PRN
  Filled 2013-11-11: qty 2

## 2013-11-11 MED ORDER — DIPHENHYDRAMINE HCL 25 MG PO CAPS
50.0000 mg | ORAL_CAPSULE | Freq: Once | ORAL | Status: AC
  Administered 2013-11-11: 50 mg via ORAL

## 2013-11-11 MED ORDER — DIPHENHYDRAMINE HCL 25 MG PO CAPS
ORAL_CAPSULE | ORAL | Status: AC
  Filled 2013-11-11: qty 2

## 2013-11-11 MED ORDER — ACETAMINOPHEN 325 MG PO TABS
650.0000 mg | ORAL_TABLET | Freq: Once | ORAL | Status: AC
  Administered 2013-11-11: 650 mg via ORAL

## 2013-11-11 MED ORDER — SODIUM CHLORIDE 0.9 % IJ SOLN
10.0000 mL | INTRAMUSCULAR | Status: DC | PRN
  Administered 2013-11-11: 10 mL
  Filled 2013-11-11: qty 10

## 2013-11-11 MED ORDER — CIPROFLOXACIN HCL 250 MG PO TABS: ORAL_TABLET | ORAL | Status: DC

## 2013-11-11 MED ORDER — SODIUM CHLORIDE 0.9 % IJ SOLN
3.0000 mL | INTRAMUSCULAR | Status: DC | PRN
  Filled 2013-11-11: qty 10

## 2013-11-11 MED ORDER — DEXTROSE 5 % IV SOLN
Freq: Once | INTRAVENOUS | Status: AC
  Administered 2013-11-11: 11:00:00 via INTRAVENOUS

## 2013-11-11 MED ORDER — SODIUM CHLORIDE 0.9 % IV SOLN: 750.0000 mg/m2 | Freq: Once | INTRAVENOUS | Status: DC

## 2013-11-11 MED ORDER — KETOROLAC TROMETHAMINE 30 MG/ML IJ SOLN
30.0000 mg | Freq: Once | INTRAMUSCULAR | Status: AC
  Administered 2013-11-11: 30 mg via INTRAVENOUS
  Filled 2013-11-11: qty 1

## 2013-11-11 MED ORDER — ONDANSETRON 8 MG/50ML IVPB (CHCC)
8.0000 mg | Freq: Once | INTRAVENOUS | Status: AC
  Administered 2013-11-11: 8 mg via INTRAVENOUS

## 2013-11-11 MED ORDER — OXALIPLATIN CHEMO INJECTION 100 MG/20ML
60.0000 mg/m2 | Freq: Once | INTRAVENOUS | Status: AC
  Administered 2013-11-11: 95 mg via INTRAVENOUS
  Filled 2013-11-11: qty 19

## 2013-11-11 MED ORDER — ACETAMINOPHEN 325 MG PO TABS
ORAL_TABLET | ORAL | Status: AC
  Filled 2013-11-11: qty 2

## 2013-11-11 MED ORDER — DEXAMETHASONE SODIUM PHOSPHATE 10 MG/ML IJ SOLN
10.0000 mg | Freq: Once | INTRAMUSCULAR | Status: AC
  Administered 2013-11-11: 10 mg via INTRAVENOUS

## 2013-11-11 MED ORDER — HEPARIN SOD (PORK) LOCK FLUSH 100 UNIT/ML IV SOLN
250.0000 [IU] | Freq: Once | INTRAVENOUS | Status: DC | PRN
  Filled 2013-11-11: qty 5

## 2013-11-11 MED ORDER — SODIUM CHLORIDE 0.9 % IV SOLN
750.0000 mg/m2 | Freq: Once | INTRAVENOUS | Status: AC
  Administered 2013-11-11: 1216 mg via INTRAVENOUS
  Filled 2013-11-11: qty 31.98

## 2013-11-11 MED ORDER — HEPARIN SOD (PORK) LOCK FLUSH 100 UNIT/ML IV SOLN
500.0000 [IU] | Freq: Once | INTRAVENOUS | Status: AC | PRN
  Administered 2013-11-11: 500 [IU]
  Filled 2013-11-11: qty 5

## 2013-11-11 MED ORDER — SODIUM CHLORIDE 0.9 % IV SOLN
INTRAVENOUS | Status: DC
  Administered 2013-11-11: 10:00:00 via INTRAVENOUS

## 2013-11-11 MED ORDER — DEXAMETHASONE SODIUM PHOSPHATE 10 MG/ML IJ SOLN
INTRAMUSCULAR | Status: AC
  Filled 2013-11-11: qty 1

## 2013-11-11 NOTE — Progress Notes (Signed)
This office note has been dictated.

## 2013-11-11 NOTE — Addendum Note (Signed)
Addended by: Burney Gauze R on: 11/11/2013 02:36 PM   Modules accepted: Orders

## 2013-11-12 ENCOUNTER — Ambulatory Visit (HOSPITAL_BASED_OUTPATIENT_CLINIC_OR_DEPARTMENT_OTHER): Payer: Medicare Other

## 2013-11-12 ENCOUNTER — Ambulatory Visit: Payer: Medicare Other

## 2013-11-12 VITALS — BP 128/47 | HR 62 | Temp 98.4°F

## 2013-11-12 DIAGNOSIS — D631 Anemia in chronic kidney disease: Secondary | ICD-10-CM

## 2013-11-12 DIAGNOSIS — C8589 Other specified types of non-Hodgkin lymphoma, extranodal and solid organ sites: Secondary | ICD-10-CM

## 2013-11-12 DIAGNOSIS — N189 Chronic kidney disease, unspecified: Principal | ICD-10-CM

## 2013-11-12 DIAGNOSIS — C8330 Diffuse large B-cell lymphoma, unspecified site: Secondary | ICD-10-CM

## 2013-11-12 DIAGNOSIS — Z5189 Encounter for other specified aftercare: Secondary | ICD-10-CM

## 2013-11-12 MED ORDER — PEGFILGRASTIM INJECTION 6 MG/0.6ML
6.0000 mg | Freq: Once | SUBCUTANEOUS | Status: AC
  Administered 2013-11-12: 6 mg via SUBCUTANEOUS
  Filled 2013-11-12: qty 0.6

## 2013-11-12 NOTE — Patient Instructions (Signed)

## 2013-11-12 NOTE — Progress Notes (Signed)
DIAGNOSES: 1. Recurrent diffuse large cell non-Hodgkin lymphoma. 2. Recent paroxysmal atrial fibrillation. 3. Renal failure-on hemodialysis.  CURRENT THERAPY: 1. The patient is status post 4 cycles of Rituxan-     gemcitabine/oxaliplatin. 2. Hemodialysis 3 days a week.  INTERIM HISTORY:  [name] comes in for followup.  She unfortunately, was hospitalized a week or so ago.  She was on dialysis.  She developed a recent atrial fibrillation.  She was admitted over to [facility].  She was placed on antiarrhythmics now.  I think she was given metoprolol.  It was decided not to put her on anticoagulation, because of her other health issues and chemotherapy.  She feels a little better today.  She does have occasional intermittent swelling in the right pre- auricular region.  I am sure this is probably a lymph node, that is involved with lymphoma.  This is does tend to "come and go".  She has had swallowing difficulties.  She has had no nausea or vomiting. There has been no abdominal pain.  She has had intermittent leg swelling.  She has had no rashes.  PHYSICAL EXAMINATION:  General:  This is a fairly well-developed, well- nourished white female, in no obvious distress.  Vital Signs: Temperature of 98.2, pulse 64, respiratory rate 14, blood pressure 132/54, weight is 129 pounds.  Head and Neck:  Normocephalic, atraumatic skull.  There are no ocular or oral lesions.  There are no palpable cervical or supraclavicular lymph nodes.  Lungs:  Clear bilaterally. Cardiac:  Regular rate and rhythm with a normal S1, S2.  She has a 1/6 systolic ejection murmur.  Abdomen:  Soft.  She has good bowel sounds. There is no fluid wave.  There is no palpable abdominal mass.  There is no palpable hepatosplenomegaly.  Back:  No tenderness over the spine, ribs, or hips.  Extremities: Does show some trace edema in the lower legs.  Skin:  Shows no rashes, ecchymoses, or petechia.  LABORATORY  STUDIES:  White cell count is 3.8, hemoglobin 10.3, hematocrit 33.3, platelet count 175.  BUN is 17, creatinine 3.7.  Potassium 4.4.  Albumin is 3.0.  IMPRESSION:  [name] is a very nice 74 year old white female.  She has recurrent large cell lymphoma.  This is her second recurrence.  We really could try to treat her first recurrence with fairly aggressive chemotherapy, which she tolerated incredibly poorly.  She is on dialysis.  She seems to be doing fairly well with this.  I think the real key now is what her next PET scan will show.  We will need to get one set up in a couple weeks.  We will we will go ahead and treat her today.  This certainly would be reasonable as she seems to be doing fairly well.  I will plan to see back in another couple of weeks.  Again, we will get the PET scan before we see her.    ______________________________ Volanda Napoleon, M.D. PRE/MEDQ  D:  11/11/2013  T:  11/12/2013  Job:  [id number]

## 2013-11-16 ENCOUNTER — Ambulatory Visit: Payer: Medicare Other

## 2013-11-16 ENCOUNTER — Other Ambulatory Visit: Payer: Medicare Other | Admitting: Lab

## 2013-11-16 ENCOUNTER — Ambulatory Visit: Payer: Medicare Other | Admitting: Hematology & Oncology

## 2013-11-18 ENCOUNTER — Encounter (HOSPITAL_COMMUNITY)
Admission: RE | Admit: 2013-11-18 | Discharge: 2013-11-18 | Disposition: A | Discharge status: Home / Self Care | Payer: Medicare Other | Source: Ambulatory Visit | Attending: Hematology & Oncology | Admitting: Hematology & Oncology

## 2013-11-18 DIAGNOSIS — C8330 Diffuse large B-cell lymphoma, unspecified site: Secondary | ICD-10-CM

## 2013-11-18 DIAGNOSIS — C8589 Other specified types of non-Hodgkin lymphoma, extranodal and solid organ sites: Secondary | ICD-10-CM | POA: Insufficient documentation

## 2013-11-18 LAB — GLUCOSE, CAPILLARY: Glucose-Capillary: 117 mg/dL — ABNORMAL HIGH (ref 70–99)

## 2013-11-18 MED ORDER — FLUDEOXYGLUCOSE F - 18 (FDG) INJECTION
18.1000 | Freq: Once | INTRAVENOUS | Status: AC | PRN
Start: 2013-11-18 — End: 2013-11-18
  Administered 2013-11-18: 18.1 via INTRAVENOUS

## 2013-11-19 ENCOUNTER — Encounter: Payer: Self-pay | Admitting: Nurse Practitioner

## 2013-11-25 ENCOUNTER — Other Ambulatory Visit (HOSPITAL_BASED_OUTPATIENT_CLINIC_OR_DEPARTMENT_OTHER): Payer: Medicare Other | Admitting: Lab

## 2013-11-25 ENCOUNTER — Encounter: Payer: Self-pay | Admitting: Hematology & Oncology

## 2013-11-25 ENCOUNTER — Ambulatory Visit (HOSPITAL_BASED_OUTPATIENT_CLINIC_OR_DEPARTMENT_OTHER): Payer: Medicare Other

## 2013-11-25 ENCOUNTER — Ambulatory Visit (HOSPITAL_BASED_OUTPATIENT_CLINIC_OR_DEPARTMENT_OTHER): Payer: Medicare Other | Admitting: Hematology & Oncology

## 2013-11-25 VITALS — BP 149/58 | HR 77 | Temp 98.6°F | Resp 14 | Ht 65.0 in | Wt 125.0 lb

## 2013-11-25 VITALS — BP 152/62 | HR 72 | Temp 97.9°F | Resp 20

## 2013-11-25 DIAGNOSIS — C8589 Other specified types of non-Hodgkin lymphoma, extranodal and solid organ sites: Secondary | ICD-10-CM

## 2013-11-25 DIAGNOSIS — C8330 Diffuse large B-cell lymphoma, unspecified site: Secondary | ICD-10-CM

## 2013-11-25 DIAGNOSIS — Z5112 Encounter for antineoplastic immunotherapy: Secondary | ICD-10-CM

## 2013-11-25 DIAGNOSIS — N186 End stage renal disease: Secondary | ICD-10-CM

## 2013-11-25 DIAGNOSIS — Z992 Dependence on renal dialysis: Secondary | ICD-10-CM

## 2013-11-25 DIAGNOSIS — N189 Chronic kidney disease, unspecified: Secondary | ICD-10-CM

## 2013-11-25 DIAGNOSIS — D631 Anemia in chronic kidney disease: Secondary | ICD-10-CM

## 2013-11-25 DIAGNOSIS — Z5111 Encounter for antineoplastic chemotherapy: Secondary | ICD-10-CM

## 2013-11-25 LAB — CMP (CANCER CENTER ONLY)
ALK PHOS: 116 U/L — AB (ref 26–84)
ALT: 15 U/L (ref 10–47)
AST: 15 U/L (ref 11–38)
Albumin: 3.2 g/dL — ABNORMAL LOW (ref 3.3–5.5)
BUN, Bld: 15 mg/dL (ref 7–22)
CO2: 34 meq/L — AB (ref 18–33)
CREATININE: 3.7 mg/dL — AB (ref 0.6–1.2)
Calcium: 9.2 mg/dL (ref 8.0–10.3)
Chloride: 91 mEq/L — ABNORMAL LOW (ref 98–108)
Glucose, Bld: 117 mg/dL (ref 73–118)
Potassium: 4.5 mEq/L (ref 3.3–4.7)
SODIUM: 140 meq/L (ref 128–145)
Total Bilirubin: 0.6 mg/dl (ref 0.20–1.60)
Total Protein: 6.8 g/dL (ref 6.4–8.1)

## 2013-11-25 LAB — CBC WITH DIFFERENTIAL (CANCER CENTER ONLY)
BASO#: 0.1 10*3/uL (ref 0.0–0.2)
BASO%: 0.5 % (ref 0.0–2.0)
EOS%: 1.8 % (ref 0.0–7.0)
Eosinophils Absolute: 0.2 10*3/uL (ref 0.0–0.5)
HCT: 33.2 % — ABNORMAL LOW (ref 34.8–46.6)
HGB: 10 g/dL — ABNORMAL LOW (ref 11.6–15.9)
LYMPH#: 1.9 10*3/uL (ref 0.9–3.3)
LYMPH%: 16.9 % (ref 14.0–48.0)
MCH: 32.1 pg (ref 26.0–34.0)
MCHC: 30.1 g/dL — AB (ref 32.0–36.0)
MCV: 106 fL — ABNORMAL HIGH (ref 81–101)
MONO#: 1.5 10*3/uL — ABNORMAL HIGH (ref 0.1–0.9)
MONO%: 13.1 % — AB (ref 0.0–13.0)
NEUT%: 67.7 % (ref 39.6–80.0)
NEUTROS ABS: 7.8 10*3/uL — AB (ref 1.5–6.5)
Platelets: 93 10*3/uL — ABNORMAL LOW (ref 145–400)
RBC: 3.12 10*6/uL — ABNORMAL LOW (ref 3.70–5.32)
RDW: 22.4 % — AB (ref 11.1–15.7)
WBC: 11.5 10*3/uL — ABNORMAL HIGH (ref 3.9–10.0)

## 2013-11-25 LAB — PREALBUMIN: PREALBUMIN: 34.3 mg/dL — AB (ref 17.0–34.0)

## 2013-11-25 LAB — LACTATE DEHYDROGENASE: LDH: 188 U/L (ref 94–250)

## 2013-11-25 MED ORDER — SODIUM CHLORIDE 0.9 % IV SOLN
Freq: Once | INTRAVENOUS | Status: AC
  Administered 2013-11-25: 10:00:00 via INTRAVENOUS

## 2013-11-25 MED ORDER — SODIUM CHLORIDE 0.9 % IV SOLN
750.0000 mg/m2 | Freq: Once | INTRAVENOUS | Status: AC
  Administered 2013-11-25: 1216 mg via INTRAVENOUS
  Filled 2013-11-25: qty 31.98

## 2013-11-25 MED ORDER — SODIUM CHLORIDE 0.9 % IV SOLN
375.0000 mg/m2 | Freq: Once | INTRAVENOUS | Status: AC
  Administered 2013-11-25: 600 mg via INTRAVENOUS
  Filled 2013-11-25: qty 60

## 2013-11-25 MED ORDER — ONDANSETRON 8 MG/50ML IVPB (CHCC)
8.0000 mg | Freq: Once | INTRAVENOUS | Status: AC
  Administered 2013-11-25: 8 mg via INTRAVENOUS

## 2013-11-25 MED ORDER — HEPARIN SOD (PORK) LOCK FLUSH 100 UNIT/ML IV SOLN
500.0000 [IU] | Freq: Once | INTRAVENOUS | Status: AC | PRN
  Administered 2013-11-25: 500 [IU]
  Filled 2013-11-25: qty 5

## 2013-11-25 MED ORDER — DEXTROSE 5 % IV SOLN
60.0000 mg/m2 | Freq: Once | INTRAVENOUS | Status: AC
  Administered 2013-11-25: 95 mg via INTRAVENOUS
  Filled 2013-11-25: qty 19

## 2013-11-25 MED ORDER — DEXAMETHASONE SODIUM PHOSPHATE 10 MG/ML IJ SOLN
INTRAMUSCULAR | Status: AC
  Filled 2013-11-25: qty 1

## 2013-11-25 MED ORDER — SODIUM CHLORIDE 0.9 % IJ SOLN
10.0000 mL | INTRAMUSCULAR | Status: DC | PRN
  Administered 2013-11-25: 10 mL
  Filled 2013-11-25: qty 10

## 2013-11-25 MED ORDER — ACETAMINOPHEN 325 MG PO TABS
650.0000 mg | ORAL_TABLET | Freq: Once | ORAL | Status: AC
  Administered 2013-11-25: 650 mg via ORAL

## 2013-11-25 MED ORDER — DEXAMETHASONE SODIUM PHOSPHATE 10 MG/ML IJ SOLN
10.0000 mg | Freq: Once | INTRAMUSCULAR | Status: AC
  Administered 2013-11-25: 10 mg via INTRAVENOUS

## 2013-11-25 MED ORDER — DEXTROSE 5 % IV SOLN
Freq: Once | INTRAVENOUS | Status: AC
  Administered 2013-11-25: 11:00:00 via INTRAVENOUS

## 2013-11-25 MED ORDER — DIPHENHYDRAMINE HCL 25 MG PO CAPS
50.0000 mg | ORAL_CAPSULE | Freq: Once | ORAL | Status: AC
  Administered 2013-11-25: 50 mg via ORAL

## 2013-11-25 NOTE — Progress Notes (Signed)
This office note has been dictated.

## 2013-11-25 NOTE — Patient Instructions (Signed)
[facility] Discharge Instructions for Patients Receiving Chemotherapy  Today you received the following chemotherapy agents Oxaliplatin, Gemzar, Rituxan  To help prevent nausea and vomiting after your treatment, we encourage you to take your nausea medication    If you develop nausea and vomiting that is not controlled by your nausea medication, call the clinic.   BELOW ARE SYMPTOMS THAT SHOULD BE REPORTED IMMEDIATELY:  *FEVER GREATER THAN 100.5 F  *CHILLS WITH OR WITHOUT FEVER  NAUSEA AND VOMITING THAT IS NOT CONTROLLED WITH YOUR NAUSEA MEDICATION  *UNUSUAL SHORTNESS OF BREATH  *UNUSUAL BRUISING OR BLEEDING  TENDERNESS IN MOUTH AND THROAT WITH OR WITHOUT PRESENCE OF ULCERS  *URINARY PROBLEMS  *BOWEL PROBLEMS  UNUSUAL RASH Items with * indicate a potential emergency and should be followed up as soon as possible.  Feel free to call the clinic you have any questions or concerns. The clinic phone number is [phone].

## 2013-11-27 NOTE — Progress Notes (Signed)
CC:   Frann Rider, M.D. Darrold Span. Florene Glen, M.D.  DIAGNOSES: 1. Recurrent diffuse large cell non-Hodgkin lymphoma. 2. Paroxysmal atrial ablation. 3. Chronic renal failure, on hemodialysis.  CURRENT THERAPY: 1. The patient is status post 5 cycles of     Rituxan/gemcitabine/oxaliplatin. 2. Hemodialysis 3 times a week.  INTERIM HISTORY:  Ms. Riggins comes in for followup.  She is doing okay. She does feel somewhat fatigued and tired.  She is getting dialysis 3 times a week.  Has taken off quite a bit of weight on her.  Her weight is down to 4 pounds since we last saw her.  I think that she probably needs to __________ a little bit more.  I think this may help with some of the dizziness that she has.  She has had no real nausea or vomiting.  She has actually tolerated the chemotherapy pretty well.  We did go ahead and do a followup PET scan on her.  I think, the PET scan showed no evidence of progressive lymphoma.  Currently, the swollen right preauricular region has no longer been swelling.  She has had no mouth problems.  She has had no dysphagia or odynophagia. She has had no cough.  There has been no diarrhea.  She still has some __________ production.  Overall, her performance status is ECOG 1-2.  PHYSICAL EXAMINATION:  General:  This is a slightly elderly appearing white female in no obvious distress.  Vital Signs:  Temperature of 98.6, pulse 77, respiratory rate 14, blood pressure 149/58, weight is 125 pounds.  Head and Neck:  Normocephalic and atraumatic skull.  There are no ocular or oral lesions.  There are no palpable cervical or supraclavicular lymph nodes.  Lungs:  Clear bilaterally.  Cardiac: Regular rate and rhythm with normal S1 and S2.  There are no murmurs, rubs, or bruits.  Abdomen:  Soft.  She has good bowel sounds.  There is no fluid wave.  There is no palpable abdominal mass.  There is no palpable hepatosplenomegaly.  Back:  No tenderness over the spine,  ribs, or hips.  Extremities:  No clubbing, cyanosis, or edema.  She has some age-related osteoarthritic changes in her joints.  She has 4+/5 muscle strength in her upper and lower extremities.  Skin:  Some dry skin.  She has no ecchymoses.  Neurological:  No focal neurological deficits.  LABORATORY STUDIES:  White cell count is 11.5, hemoglobin 10, hematocrit 33.2, and platelet count 93,000.  BUN 15 and creatinine 3.7.  IMPRESSION:  Ms. Pennings is a very charming 73 year old white female. She has recurrent diffuse large cell lymphoma.  So far, she really has responded much better than I would have ever thought.  Her PET scan continues to show that her disease is under very good control.  She has tolerated chemotherapy quite nicely.  As such, I think that as long as she continues to tolerate chemotherapy, we should continue with.  We will go ahead and plan for another 2-week followup.  Ms. Neuhaus apparently certainly understands that at some point the lymphoma will "breakthrough," and we will then have to figure out what we can be done, if anything.    ______________________________ Volanda Napoleon, M.D. PRE/MEDQ  D:  11/25/2013  T:  11/26/2013  Job:  [id number]

## 2013-12-07 ENCOUNTER — Other Ambulatory Visit (HOSPITAL_BASED_OUTPATIENT_CLINIC_OR_DEPARTMENT_OTHER): Payer: Medicare Other | Admitting: Lab

## 2013-12-07 ENCOUNTER — Ambulatory Visit (HOSPITAL_BASED_OUTPATIENT_CLINIC_OR_DEPARTMENT_OTHER): Payer: Medicare Other | Admitting: Hematology & Oncology

## 2013-12-07 ENCOUNTER — Ambulatory Visit: Payer: Medicare Other

## 2013-12-07 ENCOUNTER — Encounter: Payer: Self-pay | Admitting: Hematology & Oncology

## 2013-12-07 VITALS — BP 138/70 | HR 59 | Temp 97.2°F | Resp 16 | Ht 64.0 in | Wt 129.0 lb

## 2013-12-07 DIAGNOSIS — C8330 Diffuse large B-cell lymphoma, unspecified site: Secondary | ICD-10-CM

## 2013-12-07 DIAGNOSIS — Z992 Dependence on renal dialysis: Secondary | ICD-10-CM

## 2013-12-07 DIAGNOSIS — C8589 Other specified types of non-Hodgkin lymphoma, extranodal and solid organ sites: Secondary | ICD-10-CM

## 2013-12-07 DIAGNOSIS — N189 Chronic kidney disease, unspecified: Secondary | ICD-10-CM

## 2013-12-07 DIAGNOSIS — I4891 Unspecified atrial fibrillation: Secondary | ICD-10-CM

## 2013-12-07 LAB — LACTATE DEHYDROGENASE: LDH: 213 U/L (ref 94–250)

## 2013-12-07 LAB — CBC WITH DIFFERENTIAL (CANCER CENTER ONLY)
BASO#: 0.1 10*3/uL (ref 0.0–0.2)
BASO%: 1.1 % (ref 0.0–2.0)
EOS ABS: 0.2 10*3/uL (ref 0.0–0.5)
EOS%: 2.8 % (ref 0.0–7.0)
HEMATOCRIT: 27.5 % — AB (ref 34.8–46.6)
HGB: 8.5 g/dL — ABNORMAL LOW (ref 11.6–15.9)
LYMPH#: 1.4 10*3/uL (ref 0.9–3.3)
LYMPH%: 26.2 % (ref 14.0–48.0)
MCH: 33.1 pg (ref 26.0–34.0)
MCHC: 30.9 g/dL — ABNORMAL LOW (ref 32.0–36.0)
MCV: 107 fL — AB (ref 81–101)
MONO#: 1.1 10*3/uL — ABNORMAL HIGH (ref 0.1–0.9)
MONO%: 19.4 % — AB (ref 0.0–13.0)
NEUT#: 2.7 10*3/uL (ref 1.5–6.5)
NEUT%: 50.5 % (ref 39.6–80.0)
Platelets: 39 10*3/uL — ABNORMAL LOW (ref 145–400)
RBC: 2.57 10*6/uL — AB (ref 3.70–5.32)
RDW: 22.4 % — ABNORMAL HIGH (ref 11.1–15.7)
WBC: 5.4 10*3/uL (ref 3.9–10.0)

## 2013-12-07 LAB — CMP (CANCER CENTER ONLY)
ALT(SGPT): 19 U/L (ref 10–47)
AST: 26 U/L (ref 11–38)
Albumin: 3.2 g/dL — ABNORMAL LOW (ref 3.3–5.5)
Alkaline Phosphatase: 69 U/L (ref 26–84)
BUN: 21 mg/dL (ref 7–22)
CALCIUM: 8.9 mg/dL (ref 8.0–10.3)
CO2: 34 mEq/L — ABNORMAL HIGH (ref 18–33)
CREATININE: 5.3 mg/dL — AB (ref 0.6–1.2)
Chloride: 95 mEq/L — ABNORMAL LOW (ref 98–108)
Glucose, Bld: 121 mg/dL — ABNORMAL HIGH (ref 73–118)
POTASSIUM: 4.5 meq/L (ref 3.3–4.7)
Sodium: 139 mEq/L (ref 128–145)
Total Bilirubin: 0.5 mg/dl (ref 0.20–1.60)
Total Protein: 6.2 g/dL — ABNORMAL LOW (ref 6.4–8.1)

## 2013-12-07 LAB — PREALBUMIN: Prealbumin: 35.1 mg/dL — ABNORMAL HIGH (ref 17.0–34.0)

## 2013-12-07 NOTE — Progress Notes (Signed)
Hematology and Oncology Follow Up Visit  [name] [id number] 1941/05/12 73 y.o. 12/07/2013   Principle Diagnosis:  1. Recurrent diffuse large cell non-Hodgkin lymphoma. 2. Paroxysmal atrial ablation. 3. Chronic renal failure, on hemodialysis.   Current Therapy:   . The patient is status post 6 cycles of     Rituxan/gemcitabine/oxaliplatin. 2. Hemodialysis 3 times a week.     Interim History:  [name] is in for followup. She is doing pretty well. She's had 6 cycles of chemotherapy today. She tolerated these okay. She's not noted any further swelling in the right face. Pressure does have a little bit of a pain in the left hip. She thinks this may be how she slept. She's not fallen.  Her dialysis is doing okay. Not taken off as much fluid on her.  She has had some bruising. There's been no bleeding. PET she's had a good appetite. There's no nausea vomiting. She's had no mouth sores. PET she still making a little urine. I she does not have any issues with her bowels.  Medications: Current outpatient prescriptions:ALPRAZolam (XANAX) 0.5 MG tablet, Take 0.5 mg by mouth at bedtime as needed. , Disp: , Rfl: ;  dexamethasone (DECADRON) 4 MG tablet, Take 2 tabs daily with a meal for 3 days starting the day after chemo., Disp: 24 tablet, Rfl: 2;  docusate sodium 100 MG CAPS, Take 200 mg by mouth 3 (three) times daily as needed for constipation., Disp: 60 capsule, Rfl: 2 esomeprazole (NEXIUM) 40 MG capsule, Take 40 mg by mouth daily. , Disp: , Rfl: ;  famciclovir (FAMVIR) 125 MG tablet, Take 1 tablet on T-Th-Sat AFTER dialysis, Disp: 30 tablet, Rfl: 2;  fentaNYL (DURAGESIC - DOSED MCG/HR) 12 MCG/HR, Place 1 patch (12.5 mcg total) onto the skin every 3 (three) days., Disp: 10 patch, Rfl: 0;  lidocaine-prilocaine (EMLA) cream, Apply 1 application topically as needed (for port-a-cath access)., Disp: , Rfl:  loratadine (CLARITIN) 10 MG tablet, Take 10 mg by mouth daily as needed for allergies.  , Disp: , Rfl: ;  metoprolol tartrate (LOPRESSOR) 25 MG tablet, Take 1 tablet (25 mg total) by mouth 2 (two) times daily., Disp: 60 tablet, Rfl: 0;  ondansetron (ZOFRAN-ODT) 8 MG disintegrating tablet, TAKE 1 TABLET BY MOUTH EVERY 8 HOURS AS NEEDED FOR NAUSEA, Disp: 20 tablet, Rfl: 0 OVER THE COUNTER MEDICATION, Take 5 mLs by mouth 2 (two) times daily at 10 AM and 5 PM. creamotion cough syrup, Disp: , Rfl: ;  oxyCODONE-acetaminophen (PERCOCET/ROXICET) 5-325 MG per tablet, Take 1-2 tablets by mouth every 4 (four) hours as needed for moderate pain., Disp: , Rfl: ;  RENVELA 800 MG tablet, Take 800 mg by mouth 3 (three) times daily with meals. , Disp: , Rfl:  traMADol (ULTRAM) 50 MG tablet, Take 50 mg by mouth every 6 (six) hours as needed for moderate pain., Disp: , Rfl:   Allergies:  Allergies  Allergen Reactions  . Advicor [Niacin-Lovastatin Er]     Muscle cramps  . Crestor [Rosuvastatin]     Muscle cramps  . Lipitor [Atorvastatin]     Muscle cramps  . Lovastatin     Muscle cramps  . Morphine And Related Other (See Comments)    "made me crazy"  . Niaspan [Niacin Er]     Muscle cramps  . Nsaids     Gi side effects  . Pravachol [Pravastatin Sodium]     Gi side effects  . Sulfa Antibiotics Other (See Comments)  Can't remember   . Vytorin [Ezetimibe-Simvastatin] Nausea Only  . Welchol [Colesevelam Hcl]     Muscle cramps  . Zetia [Ezetimibe]     Muscle cramps   . Zocor [Simvastatin]     Muscle cramps     Past Medical History, Surgical history, Social history, and Family History were reviewed and updated.  Review of Systems: As above  Physical Exam:  height is 5\' 4"  (1.626 m) and weight is 129 lb (58.514 kg). Her oral temperature is 97.2 F (36.2 C). Her blood pressure is 138/70 and her pulse is 59. Her respiration is 16.   This is a fairly well-developed well-nourished white female. She is in a wheelchair. She is alert and oriented. Her head and neck exam shows no swelling in  the right pre-auricular region. There is no mucositis in the oral cavity. No adenopathy is noted in the neck. Lungs are clear. Cardiac exam regular rate and rhythm with no murmurs rubs or bruits. Abdomen is soft. She has good bowel sounds. There is no fluid wave. There is no palpable hepatospleno megaly extremities shows no clubbing cyanosis or edema. There may be some slight nonpitting edema of the lower legs. Strength is 4/5 in her legs. Skin exam shows ecchymoses scattered throughout the extremities. Neurological exam no focal neurological deficits.  Lab Results  Component Value Date   WBC 5.4 12/07/2013   HGB 8.5* 12/07/2013   HCT 27.5* 12/07/2013   MCV 107* 12/07/2013   PLT 39 Occ Large platelets present* 12/07/2013     Chemistry      Component Value Date/Time   NA 139 12/07/2013 0901   NA 140 11/04/2013 0540   K 4.5 12/07/2013 0901   K 3.9 11/04/2013 0540   CL 95* 12/07/2013 0901   CL 97 11/04/2013 0540   CO2 34* 12/07/2013 0901   CO2 30 11/04/2013 0540   BUN 21 12/07/2013 0901   BUN 9 11/04/2013 0540   CREATININE 5.3* 12/07/2013 0901   CREATININE 3.43* 11/04/2013 0540      Component Value Date/Time   CALCIUM 8.9 12/07/2013 0901   CALCIUM 8.3* 11/04/2013 0540   ALKPHOS 69 12/07/2013 0901   ALKPHOS 62 11/04/2013 0540   AST 26 12/07/2013 0901   AST 20 11/04/2013 0540   ALT 19 12/07/2013 0901   ALT 9 11/04/2013 0540   BILITOT 0.50 12/07/2013 0901   BILITOT 0.3 11/04/2013 0540         Impression and Plan: [name] is a 73 year old female. She has his second relapse of non-Hodgkin's lymphoma. She's responded actually very nicely to salvage therapy.  And unfortunately, her cast is too low to be treated. We'll have to move her treatments out by one week.  It is possible that we may have to think about giving her a "vacation" from treatment.  We'll plan to get her back next week. I don't need to see her. We will get lab work on her.  I had a long talk with her and her family about the blood counts and that we may  have to think about her not having treatment for little bit if her blood counts do not recover.   Volanda Napoleon, MD 2/9/201510:53 AM

## 2013-12-09 ENCOUNTER — Encounter: Payer: Self-pay | Admitting: Cardiology

## 2013-12-09 NOTE — Progress Notes (Unsigned)
Patient ID: [name], female   DOB: [birth year], 73 y.o.   MRN: [id number]  [name], [name]  Date of visit:  12/09/2013 DOB:  October 21, 1941    Age:  73 yrs. Medical record number:  [id number]     Account number:  [id number] Primary Care Provider: Darcus Austin ____________________________ CURRENT DIAGNOSES  1. Arrhythmia-Atrial Fibrillation  2. Hyperlipidemia  3. Hypertension,Essential (Benign)  4. CAD,Native  5. Diabetes Mellitus-NIDD  6. GERD  7. End Stage Renal Disease  8. Personal History Of Other Lymphatic And Hematopoietic Neoplasms ____________________________ ALLERGIES  Bactrim, Intolerance-unknown  Morphine, Intolerance-unknown ____________________________ MEDICATIONS  1. Nexium 40 mg Capsule, Delayed Release(E.C.), 1 p.o. daily  2. ondansetron HCl 8 mg tablet, PRN  3. dexamethasone 4 mg tablet, Take as directed  4. Rena-Vite 0.8 mg tablet, 1 p.o. daily  5. Dulcolax (bisacodyl) 5 mg tablet,delayed release, 1 p.o. daily  6. famciclovir 250 mg tablet, Take as directed  7. alprazolam 0.5 mg tablet, PRN  8. oxycodone-acetaminophen 5 mg-325 mg tablet, PRN  9. fentanyl 12 mcg/hr transdermal patch, q 3 days  10. metoprolol tartrate 50 mg tablet, 1/2 tab b.i.d. ____________________________ CHIEF COMPLAINTS  Followup of Arrhythmia-Atrial Fibrillation ____________________________ HISTORY OF PRESENT ILLNESS Patient seen for cardiac followup. She was admitted overnight to the hospital when she had an episode of supraventricular tachycardia that was either SVT or atrial flutter with 21 block. It resolved with intravenous diltiazem. She was placed on metoprolol and took it for 30 days but ran out of it a week ago and has not been on any. She continues to have some vertigo and wonders whether she should go back to have exercises to treat the vertigo. She has had no chest pain. She continues to be treated for relapsed lymphoma. She currently is having her chemotherapy held because of low  counts. She denies PND, orthopnea or significant edema. She continues on dialysis. ____________________________ PAST HISTORY  Past Medical Illnesses:  hypertension, DM-non-insulin dependent, hyperlipidemia, obesity, GERD, anxiety, lymphoma-non-Hodgkins, ESRD on hemodialysis;  Cardiovascular Illnesses:  atrial fibrillation, CAD;  Surgical Procedures:  appendectomy, hysterectomy, oophorectomy, lymph node bx, port-a-cath, shoulder surgery-rt;  Cardiology Procedures-Invasive:  cardiac cath (left) June 2012;  Cardiology Procedures-Noninvasive:  echocardiogram June 2012;  Cardiac Cath Results:  normal Left main, 20 % stenosis proximal LAD, 40% stenosis mid CFX, scattered irregularities RCA;  LVEF of 65% documented via echocardiogram on 11/04/2013,   ____________________________ CARDIO-PULMONARY TEST DATES EKG Date:  12/09/2013;   Cardiac Cath Date:  04/27/2011;  Echocardiography Date: 11/04/2013;  Chest Xray Date: 05/01/2011;   ____________________________ FAMILY HISTORY Brother -- Brother dead, Non-Hodgkins lymphoma Father -- Father dead, Myocardial infarction, Diabetes mellitus Mother -- Mother dead, Coronary Artery Disease Sister -- Sister dead, Cancer Sister -- Sister alive with problem, Dementia/Alzheimers ____________________________ SOCIAL HISTORY Alcohol Use:  no alcohol use;  Smoking:  used to smoke but quit 1983 Prior to 1980;  Diet:  regular diet;  Lifestyle:  married;  Exercise:  no regular exercise;  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 Cardiovascular:  please review HPI Abdominal: constipation Musculoskeletal:  arthritis of the right shoulder Neurological:  memory deficit, dizziness  ____________________________ PHYSICAL  EXAMINATION VITAL SIGNS  Blood Pressure:  152/60 Sitting, Right arm, regular cuff  , 150/66 Standing, Right arm and regular cuff   Pulse:  76/min. Weight:  129.50 lbs. Height:  62"BMI: 23  Constitutional:  pleasant white female, in no acute distress Skin:  multiple ecchymosis present Head:  normocephalic, normal hair pattern, no masses or tenderness Neck:  supple, without massess. No JVD, thyromegaly or carotid bruits. Carotid upstroke normal. Chest:  normal symmetry, clear to auscultation Cardiac:  regular rhythm, normal S1 and S2, No S3 or S4, no murmurs, gallops or rubs detected. Peripheral Pulses:  pulses full and equal in all extremities Extremities & Back:  limitation of range of motion shoulder, no edema present, dialysis fistula left arm Neurological:  no gross motor or sensory deficits noted, affect appropriate, oriented x3. ____________________________ MOST RECENT LIPID PANEL 06/25/12  CHOL TOTL 174 mg/dl, LDL 78 calc, HDL 32 mg/dl, TRIGLYCER 305 mg/dl and CHOL/HDL 5.5 (Calc) ____________________________ IMPRESSIONS/PLAN  1. Recurrent atrial flutter/supraventricular tachycardia 2. Relapsed lymphoma 3. End-stage renal disease  Recommendations:  She should go back on metoprolol 25 mg twice daily. I will see her back in followup in 6 months. She is to call if there are recurrent problems.Her EKG shows a right bundle-branch block nonspecific ST changes and sinus rhythm. If she develops recurrent arrhythmias in the future I would place her on low-dose amiodarone. In addition she might need to be considered for systemic anticoagulation if she has recurrent arrhythmias. ____________________________ TODAYS ORDERS  1. 12 Lead EKG: Today  2. Return Visit: 6 months  3. 12 Lead EKG: 6 months                       ____________________________ Cardiology Physician:  Kerry Hough MD [facility]

## 2013-12-11 NOTE — Progress Notes (Signed)
Hematology and Oncology Follow Up Visit  [name] [id number] 1941/04/10 73 y.o. 12/11/2013   Principle Diagnosis:   Recurrent diffuse large cell non-Hodgkin's lymphoma  Chronic renal failure-hemodialysis  Paroxysmal atrial fibrillation  Current Therapy:    Status post 6 cycles of R-GemOx  Hemodialysis 3 days a week     Interim History:  [name] is in for followup. She looks pretty good. She does feel a little tired. She's had no problem fever. There is no bleeding. Her appetite has been okay. Has been a little nausea.  Dialysis does tend to wear her out.  She's had no diarrhea. There's been no leg swelling.  She's had no rashes. There's been no headache. She's had no dysphagia or or odynophagia.  She's not noted any swelling next to the right ear   Medications: Current outpatient prescriptions:ALPRAZolam (XANAX) 0.5 MG tablet, Take 0.5 mg by mouth at bedtime as needed. , Disp: , Rfl: ;  dexamethasone (DECADRON) 4 MG tablet, Take 2 tabs daily with a meal for 3 days starting the day after chemo., Disp: 24 tablet, Rfl: 2;  docusate sodium 100 MG CAPS, Take 200 mg by mouth 3 (three) times daily as needed for constipation., Disp: 60 capsule, Rfl: 2 esomeprazole (NEXIUM) 40 MG capsule, Take 40 mg by mouth daily. , Disp: , Rfl: ;  famciclovir (FAMVIR) 125 MG tablet, Take 1 tablet on T-Th-Sat AFTER dialysis, Disp: 30 tablet, Rfl: 2;  fentaNYL (DURAGESIC - DOSED MCG/HR) 12 MCG/HR, Place 1 patch (12.5 mcg total) onto the skin every 3 (three) days., Disp: 10 patch, Rfl: 0;  lidocaine-prilocaine (EMLA) cream, Apply 1 application topically as needed (for port-a-cath access)., Disp: , Rfl:  loratadine (CLARITIN) 10 MG tablet, Take 10 mg by mouth daily as needed for allergies. , Disp: , Rfl: ;  metoprolol tartrate (LOPRESSOR) 25 MG tablet, Take 1 tablet (25 mg total) by mouth 2 (two) times daily., Disp: 60 tablet, Rfl: 0;  ondansetron (ZOFRAN-ODT) 8 MG disintegrating tablet, TAKE 1  TABLET BY MOUTH EVERY 8 HOURS AS NEEDED FOR NAUSEA, Disp: 20 tablet, Rfl: 0 OVER THE COUNTER MEDICATION, Take 5 mLs by mouth 2 (two) times daily at 10 AM and 5 PM. creamotion cough syrup, Disp: , Rfl: ;  oxyCODONE-acetaminophen (PERCOCET/ROXICET) 5-325 MG per tablet, Take 1-2 tablets by mouth every 4 (four) hours as needed for moderate pain., Disp: , Rfl: ;  RENVELA 800 MG tablet, Take 800 mg by mouth 3 (three) times daily with meals. , Disp: , Rfl:  traMADol (ULTRAM) 50 MG tablet, Take 50 mg by mouth every 6 (six) hours as needed for moderate pain., Disp: , Rfl:   Allergies:  Allergies  Allergen Reactions  . Advicor [Niacin-Lovastatin Er]     Muscle cramps  . Crestor [Rosuvastatin]     Muscle cramps  . Lipitor [Atorvastatin]     Muscle cramps  . Lovastatin     Muscle cramps  . Morphine And Related Other (See Comments)    "made me crazy"  . Niaspan [Niacin Er]     Muscle cramps  . Nsaids     Gi side effects  . Pravachol [Pravastatin Sodium]     Gi side effects  . Sulfa Antibiotics Other (See Comments)    Can't remember   . Vytorin [Ezetimibe-Simvastatin] Nausea Only  . Welchol [Colesevelam Hcl]     Muscle cramps  . Zetia [Ezetimibe]     Muscle cramps   . Zocor [Simvastatin]     Muscle cramps  Past Medical History, Surgical history, Social history, and Family History were reviewed and updated.  Review of Systems: As above  Physical Exam:  height is 5\' 4"  (1.626 m) and weight is 129 lb (58.514 kg). Her oral temperature is 97.2 F (36.2 C). Her blood pressure is 138/70 and her pulse is 59. Her respiration is 16.   Slightly elderly-appearing white female. She is slightly frail. She has no ocular or oral lesions. There is no adenopathy in the neck. Lungs are clear. Cardiac exam regular rate rhythm with no murmurs rubs or bruits. Abdomen is soft. She has no fluid wave. There is no palpable hepato splenomegaly. Extremities shows no edema. She has decent strength. No focal  neurological deficits.  Lab Results  Component Value Date   WBC 5.4 12/07/2013   HGB 8.5* 12/07/2013   HCT 27.5* 12/07/2013   MCV 107* 12/07/2013   PLT 39 Occ Large platelets present* 12/07/2013     Chemistry      Component Value Date/Time   NA 139 12/07/2013 0901   NA 140 11/04/2013 0540   K 4.5 12/07/2013 0901   K 3.9 11/04/2013 0540   CL 95* 12/07/2013 0901   CL 97 11/04/2013 0540   CO2 34* 12/07/2013 0901   CO2 30 11/04/2013 0540   BUN 21 12/07/2013 0901   BUN 9 11/04/2013 0540   CREATININE 5.3* 12/07/2013 0901   CREATININE 3.43* 11/04/2013 0540      Component Value Date/Time   CALCIUM 8.9 12/07/2013 0901   CALCIUM 8.3* 11/04/2013 0540   ALKPHOS 69 12/07/2013 0901   ALKPHOS 62 11/04/2013 0540   AST 26 12/07/2013 0901   AST 20 11/04/2013 0540   ALT 19 12/07/2013 0901   ALT 9 11/04/2013 0540   BILITOT 0.50 12/07/2013 0901   BILITOT 0.3 11/04/2013 0540         Impression and Plan: Ms.. [name] is a 31 a white female. She has recurrent lymphoma. This is her second recurrence. She's responded very nicely to chemotherapy.  Unfortunately, her blood count is just too low for me to treat today.  We will hold her chemotherapy for a week. I do not see that this will be a problem for her.  Her pre-albumin is excellent. It is a 35. As such, I think that she still doing well and responding to treatment.  Will plan to see her back ourselves in another 3 weeks. Again, she'll come in next week for treatment.   Volanda Napoleon, MD 2/13/20159:20 AM

## 2013-12-14 ENCOUNTER — Other Ambulatory Visit (HOSPITAL_BASED_OUTPATIENT_CLINIC_OR_DEPARTMENT_OTHER): Payer: Medicare Other | Admitting: Lab

## 2013-12-14 ENCOUNTER — Ambulatory Visit (HOSPITAL_BASED_OUTPATIENT_CLINIC_OR_DEPARTMENT_OTHER): Payer: Medicare Other

## 2013-12-14 VITALS — BP 166/52 | HR 57 | Temp 98.3°F | Resp 18

## 2013-12-14 DIAGNOSIS — C8330 Diffuse large B-cell lymphoma, unspecified site: Secondary | ICD-10-CM

## 2013-12-14 DIAGNOSIS — C8589 Other specified types of non-Hodgkin lymphoma, extranodal and solid organ sites: Secondary | ICD-10-CM

## 2013-12-14 DIAGNOSIS — Z5112 Encounter for antineoplastic immunotherapy: Secondary | ICD-10-CM

## 2013-12-14 DIAGNOSIS — Z5111 Encounter for antineoplastic chemotherapy: Secondary | ICD-10-CM

## 2013-12-14 LAB — CBC WITH DIFFERENTIAL (CANCER CENTER ONLY)
BASO#: 0.1 10*3/uL (ref 0.0–0.2)
BASO%: 1.1 % (ref 0.0–2.0)
EOS ABS: 0.1 10*3/uL (ref 0.0–0.5)
EOS%: 2 % (ref 0.0–7.0)
HEMATOCRIT: 31.7 % — AB (ref 34.8–46.6)
HEMOGLOBIN: 9.6 g/dL — AB (ref 11.6–15.9)
LYMPH#: 2.2 10*3/uL (ref 0.9–3.3)
LYMPH%: 40.4 % (ref 14.0–48.0)
MCH: 33.2 pg (ref 26.0–34.0)
MCHC: 30.3 g/dL — AB (ref 32.0–36.0)
MCV: 110 fL — ABNORMAL HIGH (ref 81–101)
MONO#: 1.4 10*3/uL — ABNORMAL HIGH (ref 0.1–0.9)
MONO%: 26.5 % — ABNORMAL HIGH (ref 0.0–13.0)
NEUT#: 1.6 10*3/uL (ref 1.5–6.5)
NEUT%: 30 % — ABNORMAL LOW (ref 39.6–80.0)
Platelets: 127 10*3/uL — ABNORMAL LOW (ref 145–400)
RBC: 2.89 10*6/uL — AB (ref 3.70–5.32)
RDW: 22.7 % — ABNORMAL HIGH (ref 11.1–15.7)
WBC: 5.4 10*3/uL (ref 3.9–10.0)

## 2013-12-14 LAB — CMP (CANCER CENTER ONLY)
ALBUMIN: 3.6 g/dL (ref 3.3–5.5)
ALT(SGPT): 16 U/L (ref 10–47)
AST: 17 U/L (ref 11–38)
Alkaline Phosphatase: 72 U/L (ref 26–84)
BUN: 23 mg/dL — AB (ref 7–22)
CHLORIDE: 96 meq/L — AB (ref 98–108)
CO2: 33 meq/L (ref 18–33)
Calcium: 9 mg/dL (ref 8.0–10.3)
Creat: 5 mg/dl (ref 0.6–1.2)
Glucose, Bld: 109 mg/dL (ref 73–118)
POTASSIUM: 4.2 meq/L (ref 3.3–4.7)
Sodium: 137 mEq/L (ref 128–145)
Total Bilirubin: 0.5 mg/dl (ref 0.20–1.60)
Total Protein: 6.2 g/dL — ABNORMAL LOW (ref 6.4–8.1)

## 2013-12-14 MED ORDER — SODIUM CHLORIDE 0.9 % IV SOLN
Freq: Once | INTRAVENOUS | Status: AC
  Administered 2013-12-14: 09:00:00 via INTRAVENOUS

## 2013-12-14 MED ORDER — DEXAMETHASONE SODIUM PHOSPHATE 10 MG/ML IJ SOLN
INTRAMUSCULAR | Status: AC
  Filled 2013-12-14: qty 1

## 2013-12-14 MED ORDER — SODIUM CHLORIDE 0.9 % IV SOLN
375.0000 mg/m2 | Freq: Once | INTRAVENOUS | Status: AC
  Administered 2013-12-14: 600 mg via INTRAVENOUS
  Filled 2013-12-14: qty 60

## 2013-12-14 MED ORDER — ACETAMINOPHEN 325 MG PO TABS
ORAL_TABLET | ORAL | Status: AC
Start: 2013-12-14 — End: 2013-12-14
  Filled 2013-12-14: qty 2

## 2013-12-14 MED ORDER — HEPARIN SOD (PORK) LOCK FLUSH 100 UNIT/ML IV SOLN
500.0000 [IU] | Freq: Once | INTRAVENOUS | Status: AC | PRN
  Administered 2013-12-14: 500 [IU]
  Filled 2013-12-14: qty 5

## 2013-12-14 MED ORDER — DEXAMETHASONE SODIUM PHOSPHATE 10 MG/ML IJ SOLN
10.0000 mg | Freq: Once | INTRAMUSCULAR | Status: AC
  Administered 2013-12-14: 10 mg via INTRAVENOUS

## 2013-12-14 MED ORDER — DIPHENHYDRAMINE HCL 25 MG PO CAPS
50.0000 mg | ORAL_CAPSULE | Freq: Once | ORAL | Status: AC
  Administered 2013-12-14: 50 mg via ORAL

## 2013-12-14 MED ORDER — SODIUM CHLORIDE 0.9 % IV SOLN
750.0000 mg/m2 | Freq: Once | INTRAVENOUS | Status: AC
  Administered 2013-12-14: 1216 mg via INTRAVENOUS
  Filled 2013-12-14: qty 31.98

## 2013-12-14 MED ORDER — ONDANSETRON 8 MG/50ML IVPB (CHCC)
8.0000 mg | Freq: Once | INTRAVENOUS | Status: AC
  Administered 2013-12-14: 8 mg via INTRAVENOUS

## 2013-12-14 MED ORDER — DIPHENHYDRAMINE HCL 25 MG PO CAPS
ORAL_CAPSULE | ORAL | Status: AC
  Filled 2013-12-14: qty 2

## 2013-12-14 MED ORDER — DEXTROSE 5 % IV SOLN
Freq: Once | INTRAVENOUS | Status: AC
  Administered 2013-12-14: 13:00:00 via INTRAVENOUS

## 2013-12-14 MED ORDER — ACETAMINOPHEN 325 MG PO TABS
650.0000 mg | ORAL_TABLET | Freq: Once | ORAL | Status: AC
  Administered 2013-12-14: 650 mg via ORAL

## 2013-12-14 MED ORDER — SODIUM CHLORIDE 0.9 % IJ SOLN
10.0000 mL | INTRAMUSCULAR | Status: DC | PRN
  Administered 2013-12-14: 10 mL
  Filled 2013-12-14: qty 10

## 2013-12-14 MED ORDER — OXALIPLATIN CHEMO INJECTION 100 MG/20ML
60.0000 mg/m2 | Freq: Once | INTRAVENOUS | Status: AC
  Administered 2013-12-14: 95 mg via INTRAVENOUS
  Filled 2013-12-14: qty 19

## 2013-12-14 NOTE — Patient Instructions (Signed)
[facility] Discharge Instructions for Patients Receiving Chemotherapy  Today you received the following chemotherapy agents Oxaliplatin, Gemzar, Rituxan  To help prevent nausea and vomiting after your treatment, we encourage you to take your nausea medication    If you develop nausea and vomiting that is not controlled by your nausea medication, call the clinic.   BELOW ARE SYMPTOMS THAT SHOULD BE REPORTED IMMEDIATELY:  *FEVER GREATER THAN 100.5 F  *CHILLS WITH OR WITHOUT FEVER  NAUSEA AND VOMITING THAT IS NOT CONTROLLED WITH YOUR NAUSEA MEDICATION  *UNUSUAL SHORTNESS OF BREATH  *UNUSUAL BRUISING OR BLEEDING  TENDERNESS IN MOUTH AND THROAT WITH OR WITHOUT PRESENCE OF ULCERS  *URINARY PROBLEMS  *BOWEL PROBLEMS  UNUSUAL RASH Items with * indicate a potential emergency and should be followed up as soon as possible.  Feel free to call the clinic you have any questions or concerns. The clinic phone number is [phone].    

## 2013-12-19 ENCOUNTER — Emergency Department (HOSPITAL_COMMUNITY): Payer: Medicare Other

## 2013-12-19 ENCOUNTER — Inpatient Hospital Stay (HOSPITAL_COMMUNITY)
Admission: EM | Admit: 2013-12-19 | Discharge: 2013-12-21 | DRG: 308 | Disposition: A | Discharge status: Home / Self Care | Payer: Medicare Other | Attending: Family Medicine | Admitting: Family Medicine

## 2013-12-19 ENCOUNTER — Encounter (HOSPITAL_COMMUNITY): Payer: Self-pay | Admitting: Emergency Medicine

## 2013-12-19 DIAGNOSIS — I2510 Atherosclerotic heart disease of native coronary artery without angina pectoris: Secondary | ICD-10-CM

## 2013-12-19 DIAGNOSIS — D631 Anemia in chronic kidney disease: Secondary | ICD-10-CM

## 2013-12-19 DIAGNOSIS — Z923 Personal history of irradiation: Secondary | ICD-10-CM

## 2013-12-19 DIAGNOSIS — I119 Hypertensive heart disease without heart failure: Secondary | ICD-10-CM

## 2013-12-19 DIAGNOSIS — F410 Panic disorder [episodic paroxysmal anxiety] without agoraphobia: Secondary | ICD-10-CM | POA: Diagnosis present

## 2013-12-19 DIAGNOSIS — I4891 Unspecified atrial fibrillation: Principal | ICD-10-CM

## 2013-12-19 DIAGNOSIS — Z87891 Personal history of nicotine dependence: Secondary | ICD-10-CM

## 2013-12-19 DIAGNOSIS — Z8249 Family history of ischemic heart disease and other diseases of the circulatory system: Secondary | ICD-10-CM

## 2013-12-19 DIAGNOSIS — F40240 Claustrophobia: Secondary | ICD-10-CM

## 2013-12-19 DIAGNOSIS — R079 Chest pain, unspecified: Secondary | ICD-10-CM

## 2013-12-19 DIAGNOSIS — Z882 Allergy status to sulfonamides status: Secondary | ICD-10-CM

## 2013-12-19 DIAGNOSIS — Z992 Dependence on renal dialysis: Secondary | ICD-10-CM

## 2013-12-19 DIAGNOSIS — I120 Hypertensive chronic kidney disease with stage 5 chronic kidney disease or end stage renal disease: Secondary | ICD-10-CM | POA: Diagnosis present

## 2013-12-19 DIAGNOSIS — Z888 Allergy status to other drugs, medicaments and biological substances status: Secondary | ICD-10-CM

## 2013-12-19 DIAGNOSIS — Z833 Family history of diabetes mellitus: Secondary | ICD-10-CM

## 2013-12-19 DIAGNOSIS — E119 Type 2 diabetes mellitus without complications: Secondary | ICD-10-CM | POA: Diagnosis present

## 2013-12-19 DIAGNOSIS — M1610 Unilateral primary osteoarthritis, unspecified hip: Secondary | ICD-10-CM | POA: Diagnosis present

## 2013-12-19 DIAGNOSIS — C8330 Diffuse large B-cell lymphoma, unspecified site: Secondary | ICD-10-CM

## 2013-12-19 DIAGNOSIS — D696 Thrombocytopenia, unspecified: Secondary | ICD-10-CM | POA: Diagnosis present

## 2013-12-19 DIAGNOSIS — Z881 Allergy status to other antibiotic agents status: Secondary | ICD-10-CM

## 2013-12-19 DIAGNOSIS — N186 End stage renal disease: Secondary | ICD-10-CM | POA: Diagnosis present

## 2013-12-19 DIAGNOSIS — C8589 Other specified types of non-Hodgkin lymphoma, extranodal and solid organ sites: Secondary | ICD-10-CM | POA: Diagnosis present

## 2013-12-19 DIAGNOSIS — Z807 Family history of other malignant neoplasms of lymphoid, hematopoietic and related tissues: Secondary | ICD-10-CM

## 2013-12-19 DIAGNOSIS — N189 Chronic kidney disease, unspecified: Secondary | ICD-10-CM

## 2013-12-19 DIAGNOSIS — Z885 Allergy status to narcotic agent status: Secondary | ICD-10-CM

## 2013-12-19 DIAGNOSIS — Z820 Family history of epilepsy and other diseases of the nervous system: Secondary | ICD-10-CM

## 2013-12-19 LAB — PROTIME-INR
INR: 0.89 (ref 0.00–1.49)
PROTHROMBIN TIME: 11.9 s (ref 11.6–15.2)

## 2013-12-19 LAB — I-STAT CHEM 8, ED
BUN: 13 mg/dL (ref 6–23)
CALCIUM ION: 0.98 mmol/L — AB (ref 1.13–1.30)
CHLORIDE: 95 meq/L — AB (ref 96–112)
Creatinine, Ser: 2.7 mg/dL — ABNORMAL HIGH (ref 0.50–1.10)
GLUCOSE: 92 mg/dL (ref 70–99)
HEMATOCRIT: 37 % (ref 36.0–46.0)
HEMOGLOBIN: 12.6 g/dL (ref 12.0–15.0)
Potassium: 3.1 mEq/L — ABNORMAL LOW (ref 3.7–5.3)
Sodium: 139 mEq/L (ref 137–147)
TCO2: 31 mmol/L (ref 0–100)

## 2013-12-19 LAB — URINALYSIS, ROUTINE W REFLEX MICROSCOPIC
BILIRUBIN URINE: NEGATIVE
Glucose, UA: 250 mg/dL — AB
KETONES UR: 15 mg/dL — AB
Leukocytes, UA: NEGATIVE
Nitrite: NEGATIVE
PH: 8.5 — AB (ref 5.0–8.0)
Protein, ur: 100 mg/dL — AB
SPECIFIC GRAVITY, URINE: 1.01 (ref 1.005–1.030)
Urobilinogen, UA: 0.2 mg/dL (ref 0.0–1.0)

## 2013-12-19 LAB — URINE MICROSCOPIC-ADD ON

## 2013-12-19 LAB — MRSA PCR SCREENING: MRSA by PCR: NEGATIVE

## 2013-12-19 LAB — I-STAT TROPONIN, ED: Troponin i, poc: 0.05 ng/mL (ref 0.00–0.08)

## 2013-12-19 LAB — CBC
HEMATOCRIT: 34 % — AB (ref 36.0–46.0)
Hemoglobin: 11.1 g/dL — ABNORMAL LOW (ref 12.0–15.0)
MCH: 34.5 pg — ABNORMAL HIGH (ref 26.0–34.0)
MCHC: 32.6 g/dL (ref 30.0–36.0)
MCV: 105.6 fL — ABNORMAL HIGH (ref 78.0–100.0)
PLATELETS: 40 10*3/uL — AB (ref 150–400)
RBC: 3.22 MIL/uL — ABNORMAL LOW (ref 3.87–5.11)
RDW: 21 % — AB (ref 11.5–15.5)
WBC: 3.3 10*3/uL — AB (ref 4.0–10.5)

## 2013-12-19 LAB — GLUCOSE, CAPILLARY: Glucose-Capillary: 125 mg/dL — ABNORMAL HIGH (ref 70–99)

## 2013-12-19 MED ORDER — SODIUM CHLORIDE 0.9 % IV BOLUS (SEPSIS)
500.0000 mL | Freq: Once | INTRAVENOUS | Status: AC
  Administered 2013-12-19: 500 mL via INTRAVENOUS

## 2013-12-19 MED ORDER — METOPROLOL TARTRATE 1 MG/ML IV SOLN
5.0000 mg | Freq: Once | INTRAVENOUS | Status: AC
  Administered 2013-12-19: 5 mg via INTRAVENOUS
  Filled 2013-12-19: qty 5

## 2013-12-19 MED ORDER — SEVELAMER CARBONATE 800 MG PO TABS
800.0000 mg | ORAL_TABLET | Freq: Three times a day (TID) | ORAL | Status: DC
  Administered 2013-12-20 – 2013-12-21 (×4): 800 mg via ORAL
  Filled 2013-12-19 (×7): qty 1

## 2013-12-19 MED ORDER — LORATADINE 10 MG PO TABS
10.0000 mg | ORAL_TABLET | Freq: Every day | ORAL | Status: DC | PRN
  Filled 2013-12-19: qty 1

## 2013-12-19 MED ORDER — ONDANSETRON HCL 4 MG PO TABS: 4.0000 mg | ORAL_TABLET | Freq: Four times a day (QID) | ORAL | Status: DC | PRN

## 2013-12-19 MED ORDER — AMIODARONE HCL 200 MG PO TABS
400.0000 mg | ORAL_TABLET | Freq: Two times a day (BID) | ORAL | Status: DC
  Administered 2013-12-19 – 2013-12-21 (×4): 400 mg via ORAL
  Filled 2013-12-19 (×5): qty 2

## 2013-12-19 MED ORDER — SODIUM CHLORIDE 0.9 % IV SOLN: 250.0000 mL | INTRAVENOUS | Status: DC | PRN

## 2013-12-19 MED ORDER — PANTOPRAZOLE SODIUM 40 MG PO TBEC
80.0000 mg | DELAYED_RELEASE_TABLET | Freq: Every day | ORAL | Status: DC
  Administered 2013-12-20: 80 mg via ORAL
  Filled 2013-12-19: qty 2

## 2013-12-19 MED ORDER — SODIUM CHLORIDE 0.9 % IJ SOLN
3.0000 mL | Freq: Two times a day (BID) | INTRAMUSCULAR | Status: DC
  Administered 2013-12-20: 3 mL via INTRAVENOUS

## 2013-12-19 MED ORDER — HEPARIN (PORCINE) IN NACL 100-0.45 UNIT/ML-% IJ SOLN
650.0000 [IU]/h | INTRAMUSCULAR | Status: DC
Start: 2013-12-19 — End: 2013-12-19

## 2013-12-19 MED ORDER — SODIUM CHLORIDE 0.9 % IJ SOLN
3.0000 mL | INTRAMUSCULAR | Status: DC | PRN
  Administered 2013-12-21: 10 mL via INTRAVENOUS

## 2013-12-19 MED ORDER — FAMCICLOVIR 500 MG PO TABS: 125.0000 mg | ORAL_TABLET | Freq: Once | ORAL | Status: DC

## 2013-12-19 MED ORDER — METOPROLOL TARTRATE 25 MG PO TABS
25.0000 mg | ORAL_TABLET | Freq: Two times a day (BID) | ORAL | Status: DC
  Administered 2013-12-19 – 2013-12-21 (×4): 25 mg via ORAL
  Filled 2013-12-19 (×6): qty 1

## 2013-12-19 MED ORDER — DILTIAZEM HCL 100 MG IV SOLR
5.0000 mg/h | Freq: Once | INTRAVENOUS | Status: DC
  Filled 2013-12-19: qty 100

## 2013-12-19 MED ORDER — DILTIAZEM HCL 25 MG/5ML IV SOLN
15.0000 mg | Freq: Once | INTRAVENOUS | Status: AC
  Administered 2013-12-19: 15 mg via INTRAVENOUS

## 2013-12-19 MED ORDER — VALACYCLOVIR HCL 500 MG PO TABS
250.0000 mg | ORAL_TABLET | ORAL | Status: AC
  Administered 2013-12-19: 250 mg via ORAL
  Filled 2013-12-19: qty 0.5

## 2013-12-19 MED ORDER — ONDANSETRON HCL 4 MG/2ML IJ SOLN: 4.0000 mg | Freq: Four times a day (QID) | INTRAMUSCULAR | Status: DC | PRN

## 2013-12-19 MED ORDER — DILTIAZEM HCL 100 MG IV SOLR
5.0000 mg/h | Freq: Once | INTRAVENOUS | Status: AC
  Administered 2013-12-19: 5 mg/h via INTRAVENOUS

## 2013-12-19 MED ORDER — POTASSIUM CHLORIDE CRYS ER 20 MEQ PO TBCR
20.0000 meq | EXTENDED_RELEASE_TABLET | Freq: Once | ORAL | Status: AC
  Administered 2013-12-19: 20 meq via ORAL
  Filled 2013-12-19: qty 1

## 2013-12-19 MED ORDER — DOCUSATE SODIUM 100 MG PO CAPS
200.0000 mg | ORAL_CAPSULE | Freq: Every day | ORAL | Status: DC | PRN
  Filled 2013-12-19: qty 2

## 2013-12-19 MED ORDER — SODIUM CHLORIDE 0.9 % IJ SOLN
3.0000 mL | Freq: Two times a day (BID) | INTRAMUSCULAR | Status: DC
  Administered 2013-12-20 (×2): 3 mL via INTRAVENOUS

## 2013-12-19 MED ORDER — LIDOCAINE-PRILOCAINE 2.5-2.5 % EX CREA: 1.0000 "application " | TOPICAL_CREAM | CUTANEOUS | Status: DC | PRN

## 2013-12-19 MED ORDER — FENTANYL 12 MCG/HR TD PT72
12.0000 ug | MEDICATED_PATCH | TRANSDERMAL | Status: DC
  Administered 2013-12-19: 12.5 ug via TRANSDERMAL
  Filled 2013-12-19: qty 1

## 2013-12-19 MED ORDER — HEPARIN (PORCINE) IN NACL 100-0.45 UNIT/ML-% IJ SOLN
650.0000 [IU]/h | INTRAMUSCULAR | Status: DC
  Administered 2013-12-20: 650 [IU]/h via INTRAVENOUS
  Filled 2013-12-19 (×2): qty 250

## 2013-12-19 MED ORDER — TEMAZEPAM 7.5 MG PO CAPS
7.5000 mg | ORAL_CAPSULE | Freq: Every evening | ORAL | Status: DC | PRN
  Administered 2013-12-19 – 2013-12-20 (×2): 7.5 mg via ORAL
  Filled 2013-12-19 (×2): qty 1

## 2013-12-19 MED ORDER — OXYCODONE-ACETAMINOPHEN 5-325 MG PO TABS
1.0000 | ORAL_TABLET | ORAL | Status: DC | PRN
  Filled 2013-12-19 (×2): qty 2

## 2013-12-19 NOTE — ED Notes (Addendum)
Per EMS, pt coming from dialysis with sudden onset of CP 3 hours into session. PT HR 145-170 with dialysis and with EMS. Pt received 324 ASA in route. PT currently states 7/10 tightness in center of chest.

## 2013-12-19 NOTE — Progress Notes (Signed)
ANTICOAGULATION CONSULT NOTE - Initial Consult  Pharmacy Consult for Heparin and Coumadin Indication: atrial fibrillation  Allergies  Allergen Reactions  . Advicor [Niacin-Lovastatin Er]     Muscle cramps  . Crestor [Rosuvastatin]     Muscle cramps  . Lipitor [Atorvastatin]     Muscle cramps  . Lovastatin     Muscle cramps  . Morphine And Related Other (See Comments)    "made me crazy"  . Niaspan [Niacin Er]     Muscle cramps  . Nsaids     Gi side effects  . Pravachol [Pravastatin Sodium]     Gi side effects  . Sulfa Antibiotics Other (See Comments)    Can't remember   . Vytorin [Ezetimibe-Simvastatin] Nausea Only  . Welchol [Colesevelam Hcl]     Muscle cramps  . Zetia [Ezetimibe]     Muscle cramps   . Zocor [Simvastatin]     Muscle cramps     Patient Measurements:   Heparin Dosing Weight: 58.5 kg  Vital Signs: BP: 140/67 mmHg (02/21 1645) Pulse Rate: 133 (02/21 1645)  Labs:  Recent Labs  12/19/13 1540 12/19/13 1549  HGB 11.1* 12.6  HCT 34.0* 37.0  PLT PENDING  --   LABPROT 11.9  --   INR 0.89  --   CREATININE  --  2.70*    The CrCl is unknown because both a height and weight (above a minimum accepted value) are required for this calculation.   Medical History: Past Medical History  Diagnosis Date  . Hypertension   . Anxiety     h/o anxiety attacks  . Anemia of renal disease   . Claustrophobia   . Panic attacks   . 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 CAD (coronary artery disease)   . Atrial fibrillation   . High cholesterol     hx  . Type 2 diabetes mellitus     "don't take RX now" (11/04/2013)  . Arthritis     "left hip" (11/04/2013)  . ESRD (end stage renal disease) on dialysis 2014    "[employer] [employer]; TTS" (11/04/2013)  . Large cell (diffuse) non-Hodgkin's lymphoma dx'd ?2011    Medications:  See PTA medication list  Assessment: 73 y/o female with ESRD who presented to the ED after having chest pain  during HD. She was found to be in Afib with RVR. Pharmacy consulted to begin heparin and Coumadin. She has been on anticoagulation in the past but this was stopped possibly for anemia/thrombocytopenia. She is also currently receiving chemo for recurrent non-Hodgkin's lymphoma. Her H/H are normal however she has thrombocytopenia with a platelet count of 40.  I spoke with Dr. Marlou Porch after platelet count resulted at 40K. We decided to hold anticoagulation for now.  Goal of Therapy:  INR 2-3 Heparin level 0.3-0.7 units/ml Monitor platelets by anticoagulation protocol: Yes   Plan:  -Hold anticoagulation due to thrombocytopenia  [facility], [place].D., BCPS Clinical Pharmacist Pager: [phone] 12/19/2013 5:12 PM

## 2013-12-19 NOTE — Progress Notes (Addendum)
ANTICOAGULATION CONSULT NOTE - Follow-up Consult  Pharmacy Consult for Heparin and Coumadin Indication: atrial fibrillation  Allergies  Allergen Reactions  . Advicor [Niacin-Lovastatin Er]     Muscle cramps  . Crestor [Rosuvastatin]     Muscle cramps  . Lipitor [Atorvastatin]     Muscle cramps  . Lovastatin     Muscle cramps  . Morphine And Related Other (See Comments)    "made me crazy"  . Niaspan [Niacin Er]     Muscle cramps  . Nsaids     Gi side effects  . Pravachol [Pravastatin Sodium]     Gi side effects  . Sulfa Antibiotics Other (See Comments)    Can't remember   . Vytorin [Ezetimibe-Simvastatin] Nausea Only  . Welchol [Colesevelam Hcl]     Muscle cramps  . Zetia [Ezetimibe]     Muscle cramps   . Zocor [Simvastatin]     Muscle cramps     Patient Measurements: Height: 5\' 2"  (157.5 cm) Weight: 124 lb 12.5 oz (56.6 kg) IBW/kg (Calculated) : 50.1 Heparin Dosing Weight: 56.6 kg  Vital Signs: Temp: 98.8 F (37.1 C) (02/21 2000) Temp src: Oral (02/21 2000) BP: 145/44 mmHg (02/21 2000) Pulse Rate: 65 (02/21 2000)  Labs:  Recent Labs  12/19/13 1540 12/19/13 1549  HGB 11.1* 12.6  HCT 34.0* 37.0  PLT 40*  --   LABPROT 11.9  --   INR 0.89  --   CREATININE  --  2.70*    Estimated Creatinine Clearance: 15.1 ml/min (by C-G formula based on Cr of 2.7).   Medical History: Past Medical History  Diagnosis Date  . Hypertension   . Anxiety     h/o anxiety attacks  . Anemia of renal disease   . Claustrophobia   . Panic attacks   . 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 CAD (coronary artery disease)   . Atrial fibrillation   . High cholesterol     hx  . Type 2 diabetes mellitus     "don't take RX now" (11/04/2013)  . Arthritis     "left hip" (11/04/2013)  . ESRD (end stage renal disease) on dialysis 2014    "[employer]; TTS" (11/04/2013)  . Large cell (diffuse) non-Hodgkin's lymphoma dx'd ?2011    Medications:  See  PTA medication list  Assessment: 73 y/o female with ESRD who presented to the ED after having chest pain during HD. She was found to be in Afib with RVR. Pharmacy consulted to begin heparin and Coumadin. She has been on anticoagulation in the past but this was stopped possibly for anemia/thrombocytopenia. She is also currently receiving chemo for recurrent non-Hodgkin's lymphoma. Her H/H are normal however she has thrombocytopenia with a platelet count of 40.  Initially pharmacy asked to hold-off on anticoagulation.  Then heparin reordered by Dr. Marin Olp, asked to target low end of the range, with no bolus.  Spoke to Dr. Marin Olp - continue holding off on Coumadin for now.  Asked to start heparin 6 hrs after HD site d/c'd, this was done ~ 9 PM.  Goal of Therapy:  INR 2-3 Heparin level 0.3-0.5 Monitor platelets by anticoagulation protocol: Yes   Plan:  1. Start IV heparin at 650 units/hr at 0300 AM tomorrow. 2. Check heparin level 8 hrs after gtt starts. 3. Daily heparin level and CBC. 4. F/u if any plans for oral anticoagulation.  Uvaldo Rising, BCPS  Clinical Pharmacist Pager (  [phone]  12/19/2013 9:27 PM

## 2013-12-19 NOTE — ED Notes (Signed)
Called pharmacy to send Valtrex. Was told still working on label, will send soon.

## 2013-12-19 NOTE — ED Notes (Signed)
Called pharmacy to confirm ok for pt to receive Valtrex due to pt allergy to Lovastatin. Pharmacy confirmed to override.

## 2013-12-19 NOTE — ED Notes (Signed)
Attempted to call reports to [facility], was told receiving RN would call back.

## 2013-12-19 NOTE — Significant Event (Signed)
Hemodialysis needles dc'd intact from graft left arm Pressure held for  thirty minutes with bleeding controlled. Light pressure bandages applied to site.  Circulation in graft remaining  excellent after procedure completed.

## 2013-12-19 NOTE — ED Notes (Signed)
Paged IV team for dialysis access removal.

## 2013-12-19 NOTE — H&P (Signed)
[facility] History and Physical  [name] LOV:[id number] DOB: 06-Dec-1940 DOA: 12/19/2013  Referring physician: Dr. Judithann Graves PCP: [name], MD  Specialists: [facility] cardiology and oncology  Chief Complaint: Atrial fibrillation with RVR  HPI: [name] is a 73 y.o. female  With history of end-stage renal disease on hemodialysis, DM, non-Hodgkin lymphoma, end-stage renal disease on hemodialysis schedule Tuesday, Thursday, Saturday. She presented to the ED after she was found to have atrial fibrillation with RVR while at dialysis.  Patient has no new complaint and denies any chest pain or shortness of breath, or new neurological deficits. Reportedly this has occurred in the past and has subsided. As such patient was not on anticoagulation.  Was evaluated by the cardiologist who recommended medical admission. Was started on anticoagulation in the ED   Review of Systems:  Constitutional:  No weight loss, night sweats, Fevers, chills, fatigue.  HEENT:  No headaches, Difficulty swallowing,Tooth/dental problems,Sore throat,  No sneezing, itching, ear ache, nasal congestion, post nasal drip,  Cardio-vascular:  No chest pain, Orthopnea, PND, swelling in lower extremities, anasarca, dizziness, palpitations  GI:  No heartburn, indigestion, abdominal pain, nausea, vomiting, diarrhea, change in bowel habits, loss of appetite  Resp:  No shortness of breath with exertion or at rest. No excess mucus, no productive cough, No non-productive cough, No coughing up of blood.No change in color of mucus.No wheezing.No chest wall deformity  Skin:  no rash or lesions.  GU:  no dysuria, change in color of urine, no urgency or frequency. No flank pain.  Musculoskeletal:  No joint pain or swelling. No decreased range of motion. No back pain.  Psych:  No change in mood or affect. No depression or anxiety. No memory loss.   Past Medical History  Diagnosis Date  . Hypertension    . Anxiety     h/o anxiety attacks  . Anemia of renal disease   . Claustrophobia   . Panic attacks   . 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 CAD (coronary artery disease)   . Atrial fibrillation   . High cholesterol     hx  . Type 2 diabetes mellitus     "don't take RX now" (11/04/2013)  . Arthritis     "left hip" (11/04/2013)  . ESRD (end stage renal disease) on dialysis 2014    "[employer]; TTS" (11/04/2013)  . 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 Appendectomy  2009  . Cardiac catheterization  2011  . Shoulder open rotator cuff repair Right ?2013  . Esophagogastroduodenoscopy (egd) with propofol  10/06/2012    Procedure: ESOPHAGOGASTRODUODENOSCOPY (EGD) WITH PROPOFOL;  Surgeon: Arta Silence, MD;  Location: [facility];  Service: Endoscopy;  Laterality: N/A;  . Colonoscopy with propofol  10/06/2012    Procedure: COLONOSCOPY WITH PROPOFOL;  Surgeon: Arta Silence, MD;  Location: [facility];  Service: Endoscopy;  Laterality: N/A;  . Colonoscopy with propofol N/A 01/14/2013    Procedure: COLONOSCOPY WITH PROPOFOL;  Surgeon: Arta Silence, MD;  Location: [facility];  Service: Endoscopy;  Laterality: N/A;  . Portacath placement Right 01/22/2013  . Av fistula placement Left 07/24/2013    Procedure: ARTERIOVENOUS (AV) FISTULA CREATION- LEFT ;  Surgeon: Angelia Mould, MD;  Location: [place];  Service: Vascular;  Laterality: Left;  . Abdominal hysterectomy  1980's  . Tonsillectomy  1954   Social History:  reports that  she quit smoking about 49 years ago. Her smoking use included Cigarettes. She started smoking about 65 years ago. She has a 3.75 pack-year smoking history. She has never used smokeless tobacco. She reports that she does not drink alcohol or use illicit drugs.  Allergies  Allergen Reactions  . Advicor [Niacin-Lovastatin Er]     Muscle cramps  . Crestor  [Rosuvastatin]     Muscle cramps  . Lipitor [Atorvastatin]     Muscle cramps  . Lovastatin     Muscle cramps  . Morphine And Related Other (See Comments)    "made me crazy"  . Niaspan [Niacin Er]     Muscle cramps  . Nsaids     Gi side effects  . Pravachol [Pravastatin Sodium]     Gi side effects  . Sulfa Antibiotics Other (See Comments)    Can't remember   . Vytorin [Ezetimibe-Simvastatin] Nausea Only  . Welchol [Colesevelam Hcl]     Muscle cramps  . Zetia [Ezetimibe]     Muscle cramps   . Zocor [Simvastatin]     Muscle cramps     Family History  Problem Relation Age of Onset  . Cancer Mother     throat  . Alzheimer's disease Sister   . Cancer Brother     lymphoma  . Cancer Sister     unknown type     Prior to Admission medications   Medication Sig Start Date End Date Taking? Authorizing Provider  dexamethasone (DECADRON) 4 MG tablet Take 2 tabs daily with a meal for 3 days starting the day after chemo. 09/07/13  Yes Volanda Napoleon, MD  docusate sodium 100 MG CAPS Take 200 mg by mouth 3 (three) times daily as needed for constipation. 01/27/13  Yes Volanda Napoleon, MD  esomeprazole (NEXIUM) 40 MG capsule Take 40 mg by mouth daily.    Yes Historical Provider, MD  famciclovir [facility]) 125 MG tablet Take 1 tablet on T-Th-Sat AFTER dialysis 09/07/13  Yes Volanda Napoleon, MD  fentaNYL (DURAGESIC - DOSED MCG/HR) 12 MCG/HR Place 1 patch (12.5 mcg total) onto the skin every 3 (three) days. 10/05/13  Yes Volanda Napoleon, MD  lidocaine-prilocaine (EMLA) cream Apply 1 application topically as needed (for port-a-cath access).   Yes Historical Provider, MD  loratadine (CLARITIN) 10 MG tablet Take 10 mg by mouth daily as needed for allergies.    Yes Historical Provider, MD  metoprolol tartrate (LOPRESSOR) 25 MG tablet Take 1 tablet (25 mg total) by mouth 2 (two) times daily. 11/04/13  Yes Delfina Redwood, MD  ondansetron (ZOFRAN-ODT) 8 MG disintegrating tablet TAKE 1 TABLET BY MOUTH  EVERY 8 HOURS AS NEEDED FOR NAUSEA 11/11/13  Yes Volanda Napoleon, MD  oxyCODONE-acetaminophen (PERCOCET/ROXICET) 5-325 MG per tablet Take 1-2 tablets by mouth every 4 (four) hours as needed for moderate pain. 10/07/13  Yes Blair Promise, MD  RENVELA 800 MG tablet Take 800 mg by mouth 3 (three) times daily with meals.  09/17/13  Yes Historical Provider, MD  traMADol (ULTRAM) 50 MG tablet Take 50 mg by mouth every 6 (six) hours as needed for moderate pain.   Yes Historical Provider, MD   Physical Exam: Filed Vitals:   12/19/13 1715  BP: 137/71  Pulse: 131  Resp: 14    BP 137/71  Pulse 131  Resp 14  SpO2 100%  General:  Appears calm and comfortable Eyes: PERRL, normal lids, irises & conjunctiva ENT: grossly normal hearing, lips & tongue  Neck: no LAD, masses or thyromegaly Cardiovascular: Irregularly irregular elevated rate, no m/r/g. No LE edema. Telemetry: Irregularly irregular with RVR Respiratory: CTA bilaterally, no w/r/r. Normal respiratory effort. Abdomen: soft, ntnd Skin: no rash or induration seen on limited exam Musculoskeletal: grossly normal tone BUE/BLE Psychiatric: grossly normal mood and affect, speech fluent and appropriate Neurologic: grossly non-focal.          Labs on Admission:  Basic Metabolic Panel:  Recent Labs Lab 12/14/13 0900 12/19/13 1549  NA 137 139  K 4.2 3.1*  CL 96* 95*  CO2 33  --   GLUCOSE 109 92  BUN 23* 13  CREATININE 5.0* 2.70*  CALCIUM 9.0  --    Liver Function Tests:  Recent Labs Lab 12/14/13 0900  AST 17  ALT 16  ALKPHOS 72  BILITOT 0.50  PROT 6.2*   No results found for this basename: LIPASE, AMYLASE,  in the last 168 hours No results found for this basename: AMMONIA,  in the last 168 hours CBC:  Recent Labs Lab 12/14/13 0900 12/19/13 1540 12/19/13 1549  WBC 5.4 3.3*  --   NEUTROABS 1.6  --   --   HGB 9.6* 11.1* 12.6  HCT 31.7* 34.0* 37.0  MCV 110* 105.6*  --   PLT 127* 40*  --    Cardiac Enzymes: No  results found for this basename: CKTOTAL, CKMB, CKMBINDEX, TROPONINI,  in the last 168 hours  BNP (last 3 results)  Recent Labs  11/03/13 1655  PROBNP 6465.0*   CBG: No results found for this basename: GLUCAP,  in the last 168 hours  Radiological Exams on Admission: Dg Chest Portable 1 View  12/19/2013   CLINICAL DATA:  Chest pain  EXAM: PORTABLE CHEST - 1 VIEW  COMPARISON:  11/03/2013  FINDINGS: Stable right IJ power port catheter and left IJ dialysis catheter positions. Normal heart size and vascularity. Negative for CHF or pneumonia. Chronic central bronchitic changes as before. No effusion or pneumothorax. Trachea is midline. Monitor leads overlie the chest.  IMPRESSION: Stable exam.  New superimposed acute process.   Electronically Signed   By: Daryll Brod M.D.   On: 12/19/2013 16:24    Assessment/Plan Active Problems:   Atrial fibrillation with RVR (principal problem) - Cardiology managing. We'll defer management to cardiology at this time. - Consulted oncologist for recommendations regarding anticoagulation given low platelet count. Currently okay with heparin drip and Coumadin per my discussion with oncologist. - Transfers step down unit  ESRD - Placed voicemail for nephrology so that they can continue to monitor HD. - Reassess BMP next a.m. - renal diet.  Large cell non hodgkin lymphoma - Patient to continue routine followup with oncologist once discharged - Oncology has been contacted - Most likely cause of thrombocytopenia  DM - Home medications reviewed and no hypoglycemic agents on board - Last glucose level 92. Will obtain BMP next am if serum glucose above 120 will plan on placing on SSI  Code Status: Full Family Communication: Discussed with patient and family members Disposition Plan: Per specialist involved making recommendations regarding primary problem  Time spent: > 55 minutes  Velvet Bathe [facility] Pager [phone]

## 2013-12-19 NOTE — ED Provider Notes (Signed)
CSN: [id number]     Arrival date & time 12/19/13  1506 History   First MD Initiated Contact with Patient 12/19/13 1525     Chief Complaint  Patient presents with  . Chest Pain    Patient is a 73 y.o. female presenting with chest pain.  Chest Pain Pain location:  Substernal area Pain quality: pressure   Pain radiates to:  Does not radiate Pain radiates to the back: no   Pain severity:  Mild Onset quality:  Sudden Duration:  1 hour Timing:  Constant Progression:  Unchanged Chronicity:  New Context: at rest   Relieved by:  None tried Worsened by:  Nothing tried Ineffective treatments:  None tried Associated symptoms: palpitations and shortness of breath   Associated symptoms: no abdominal pain, no AICD problem, no altered mental status, no anorexia, no anxiety, no back pain, no claudication, no cough, no diaphoresis, no dizziness, no dysphagia, no fatigue, no fever, no headache, no heartburn, no lower extremity edema, no nausea, no near-syncope, no numbness, no orthopnea, not vomiting and no weakness   Risk factors: hypertension     Past Medical History  Diagnosis Date  . Hypertension   . Anxiety     h/o anxiety attacks  . Anemia of renal disease   . Claustrophobia   . Panic attacks   . 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 CAD (coronary artery disease)   . Atrial fibrillation   . High cholesterol     hx  . Type 2 diabetes mellitus     "don't take RX now" (11/04/2013)  . Arthritis     "left hip" (11/04/2013)  . ESRD (end stage renal disease) on dialysis 2014    "[employer]; TTS" (11/04/2013)  . 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 Appendectomy  2009  . Cardiac catheterization  2011  . Shoulder open rotator cuff repair Right ?2013  . Esophagogastroduodenoscopy (egd) with propofol  10/06/2012    Procedure: ESOPHAGOGASTRODUODENOSCOPY (EGD) WITH PROPOFOL;  Surgeon:  Arta Silence, MD;  Location: [facility];  Service: Endoscopy;  Laterality: N/A;  . Colonoscopy with propofol  10/06/2012    Procedure: COLONOSCOPY WITH PROPOFOL;  Surgeon: Arta Silence, MD;  Location: [facility];  Service: Endoscopy;  Laterality: N/A;  . Colonoscopy with propofol N/A 01/14/2013    Procedure: COLONOSCOPY WITH PROPOFOL;  Surgeon: Arta Silence, MD;  Location: [facility];  Service: Endoscopy;  Laterality: N/A;  . Portacath placement Right 01/22/2013  . Av fistula placement Left 07/24/2013    Procedure: ARTERIOVENOUS (AV) FISTULA CREATION- LEFT ;  Surgeon: Angelia Mould, MD;  Location: [place];  Service: Vascular;  Laterality: Left;  . Abdominal hysterectomy  1980's  . Tonsillectomy  1954   Family History  Problem Relation Age of Onset  . Cancer Mother     throat  . Alzheimer's disease Sister   . Cancer Brother     lymphoma  . Cancer Sister     unknown type   History  Substance Use Topics  . Smoking status: Former Smoker -- 0.25 packs/day for 15 years    Types: Cigarettes    Start date: 11/25/1948    Quit date: 04/01/1964  . Smokeless tobacco: Never Used     Comment: quit 59 years ago  . Alcohol Use: No   OB History   Grav Para Term Preterm Abortions TAB SAB Ect Mult  Living                 Review of Systems  Constitutional: Negative for fever, chills, diaphoresis, activity change, appetite change and fatigue.  HENT: Negative for congestion, ear pain, rhinorrhea and trouble swallowing.   Eyes: Negative for pain.  Respiratory: Positive for shortness of breath. Negative for cough.   Cardiovascular: Positive for chest pain and palpitations. Negative for orthopnea, claudication and near-syncope.  Gastrointestinal: Negative for heartburn, nausea, vomiting, abdominal pain and anorexia.  Genitourinary: Negative for dysuria, difficulty urinating and pelvic pain.  Musculoskeletal: Negative for back pain and neck pain.  Skin: Negative for rash and wound.   Neurological: Negative for dizziness, weakness, numbness and headaches.  Psychiatric/Behavioral: Negative for behavioral problems, confusion and agitation.      Allergies  Advicor; Crestor; Lipitor; Lovastatin; Morphine and related; Niaspan; Nsaids; Pravachol; Sulfa antibiotics; Vytorin; Welchol; Zetia; and Zocor  Home Medications   Current Outpatient Rx  Name  Route  Sig  Dispense  Refill  . dexamethasone (DECADRON) 4 MG tablet      Take 2 tabs daily with a meal for 3 days starting the day after chemo.   24 tablet   2   . docusate sodium 100 MG CAPS   Oral   Take 200 mg by mouth 3 (three) times daily as needed for constipation.   60 capsule   2   . esomeprazole (NEXIUM) 40 MG capsule   Oral   Take 40 mg by mouth daily.          . famciclovir (FAMVIR) 125 MG tablet      Take 1 tablet on T-Th-Sat AFTER dialysis   30 tablet   2   . fentaNYL (DURAGESIC - DOSED MCG/HR) 12 MCG/HR   Transdermal   Place 1 patch (12.5 mcg total) onto the skin every 3 (three) days.   10 patch   0   . lidocaine-prilocaine (EMLA) cream   Topical   Apply 1 application topically as needed (for port-a-cath access).         Marland Kitchen loratadine (CLARITIN) 10 MG tablet   Oral   Take 10 mg by mouth daily as needed for allergies.          . metoprolol tartrate (LOPRESSOR) 25 MG tablet   Oral   Take 1 tablet (25 mg total) by mouth 2 (two) times daily.   60 tablet   0   . ondansetron (ZOFRAN-ODT) 8 MG disintegrating tablet      TAKE 1 TABLET BY MOUTH EVERY 8 HOURS AS NEEDED FOR NAUSEA   20 tablet   0   . oxyCODONE-acetaminophen (PERCOCET/ROXICET) 5-325 MG per tablet   Oral   Take 1-2 tablets by mouth every 4 (four) hours as needed for moderate pain.         Marland Kitchen RENVELA 800 MG tablet   Oral   Take 800 mg by mouth 3 (three) times daily with meals.          . traMADol (ULTRAM) 50 MG tablet   Oral   Take 50 mg by mouth every 6 (six) hours as needed for moderate pain.          BP  133/44  Pulse 64  Resp 14  SpO2 100% Physical Exam  Constitutional: She is oriented to person, place, and time. She appears well-developed and well-nourished. No distress.  HENT:  Head: Normocephalic and atraumatic.  Nose: Nose normal.  Mouth/Throat: Oropharynx is clear and moist.  Eyes: EOM are normal. Pupils are equal, round, and reactive to light.  Neck: Normal range of motion. Neck supple. No tracheal deviation present.  Cardiovascular: Normal heart sounds and intact distal pulses.   Irregularly irregular   Pulmonary/Chest: Effort normal and breath sounds normal. She has no rales.  Abdominal: Soft. Bowel sounds are normal. She exhibits no distension. There is no tenderness. There is no rebound and no guarding.  Musculoskeletal: Normal range of motion. She exhibits no tenderness.  Neurological: She is alert and oriented to person, place, and time.  Skin: Skin is warm and dry. No rash noted.  Psychiatric: She has a normal mood and affect. Her behavior is normal.    ED Course  Procedures (including critical care time) Labs Review Labs Reviewed  CBC - Abnormal; Notable for the following:    WBC 3.3 (*)    RBC 3.22 (*)    Hemoglobin 11.1 (*)    HCT 34.0 (*)    MCV 105.6 (*)    MCH 34.5 (*)    RDW 21.0 (*)    Platelets 40 (*)    All other components within normal limits  I-STAT CHEM 8, ED - Abnormal; Notable for the following:    Potassium 3.1 (*)    Chloride 95 (*)    Creatinine, Ser 2.70 (*)    Calcium, Ion 0.98 (*)    All other components within normal limits  PROTIME-INR  URINALYSIS, ROUTINE W REFLEX MICROSCOPIC  I-STAT TROPOININ, ED  I-STAT TROPOININ, ED   Imaging Review Dg Chest Portable 1 View  12/19/2013   CLINICAL DATA:  Chest pain  EXAM: PORTABLE CHEST - 1 VIEW  COMPARISON:  11/03/2013  FINDINGS: Stable right IJ power port catheter and left IJ dialysis catheter positions. Normal heart size and vascularity. Negative for CHF or pneumonia. Chronic central  bronchitic changes as before. No effusion or pneumothorax. Trachea is midline. Monitor leads overlie the chest.  IMPRESSION: Stable exam.  New superimposed acute process.   Electronically Signed   By: Daryll Brod M.D.   On: 12/19/2013 16:24    EKG Interpretation    Date/Time:  Saturday December 19 2013 18:21:22 EST Ventricular Rate:  62 PR Interval:  195 QRS Duration: 125 QT Interval:  480 QTC Calculation: 487 R Axis:   79 Text Interpretation:  Sinus rhythm Right bundle branch block Nonspecific T abnormalities, lateral leads atrial fibrillation has resolved Confirmed by Winfred Leeds  MD, SAM ([id number]) on 12/19/2013 6:26:29 PM            MDM   Final diagnoses:  Atrial fibrillation with RVR    73 yo F with hx of a fib not anticoagulated presents in Afib RVR. Patient was finishing her dialysis session when she developed chest pressure and SOB. Patient was noted to be tachycardic and was brought to the ED. On arrival tachycardic and irregularly irregular.   4:22 PM spoke with Dr. Purvis Sheffield who plans on evaluating the patient.   5:28 PM Cardiology recommends hospitalist admission given multiple medical problems. Will follow the patient once in patient.   5:42 PM Spoke with Dr. Wendee Beavers who plans on admitting patient. Per Dr. Veneta Penton will be administering 5 mg of metoprolol and amiodarone.   Case dsicussed with my attending Dr. Winfred Leeds.      Ruthell Rummage, MD 12/19/13 [phone]

## 2013-12-19 NOTE — ED Notes (Signed)
MD at bedside. 

## 2013-12-19 NOTE — ED Notes (Signed)
Pt informed of need for urine sample, pt states "I will try."

## 2013-12-19 NOTE — Consult Note (Addendum)
Admit date: 12/19/2013 Referring Physician  Dr. Winfred Leeds Primary Physician [name], MD Primary Cardiologist  Dr. [name] Reason for Consultation  Afib with RVR  HPI: 73 year old patient on hemodialysis, end-stage renal disease, non-flow-limiting CAD, diabetic with hypertension, anxiety who experienced sudden onset of chest pain approximately 3 hours into hemodialysis session with concomitant heart rates in the 140s to 170s showing atrial fibrillation. She was placed on IV diltiazem here in the emergency department. Chest pain was substernal, quite severe initially,  Mild upon arrival to ER. Had some associated shortness of breath as well as sensation of palpitations. No recent fevers.  EKG on 12/19/13 at 3:14 PM shows an irregularly irregular rhythm at 147 beats per minute likely atrial fibrillation with right bundle branch block underlying.  I reviewed note from 12/09/13 from Dr. Thurman Coyer office visit where he reviewed a recent overnight admission for supraventricular tachycardia that was either SVT or atrial flutter with 2 to one block. He resolved with intravenous diltiazem. She was placed on metoprolol and took it for approximately 30 days but ran out prior to his appointment. This was restarted. 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 Husband is present in room.  Currently she is feeling quite comfortable, no further chest discomfort as heart rate has decreased to 125. She admits to missing a day of hemodialysis.    Cardiology Procedures-  Cardiac Cath Results 04/27/11: normal Left main, 20 % stenosis proximal LAD, 40% stenosis mid CFX, scattered irregularities RCA; LVEF of 65% documented via echocardiogram on 11/04/2013,   Echocardiogram 11/04/13: - Left ventricle: The cavity size was normal. Systolic function was normal. The estimated ejection fraction was in the range of 60% to 65%. Wall motion was normal; there were no regional wall motion abnormalities.  Doppler parameters are consistent with a reversible restrictive pattern, indicative of decreased left ventricular diastolic compliance and/or increased left atrial pressure (grade 3 diastolic dysfunction). - Mitral valve: Mildly calcified annulus. Mild regurgitation. - Left atrium: The atrium was moderately dilated. - Right atrium: The atrium was mildly dilated. - Pulmonary arteries: Systolic pressure was mildly increased. PA peak pressure: 42mm Hg (S).    PMH:   Past Medical History  Diagnosis Date  . Hypertension   . Anxiety     h/o anxiety attacks  . Anemia of renal disease   . Claustrophobia   . Panic attacks   . 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 CAD (coronary artery disease)   . Atrial fibrillation   . High cholesterol     hx  . Type 2 diabetes mellitus     "don't take RX now" (11/04/2013)  . Arthritis     "left hip" (11/04/2013)  . ESRD (end stage renal disease) on dialysis 2014    "[employer]; TTS" (11/04/2013)  . Large cell (diffuse) non-Hodgkin's lymphoma dx'd ?2011    PSH:   Past Surgical History  Procedure Laterality Date  . Appendectomy  2009  . Cardiac catheterization  2011  . Shoulder open rotator cuff repair Right ?2013  . Esophagogastroduodenoscopy (egd) with propofol  10/06/2012    Procedure: ESOPHAGOGASTRODUODENOSCOPY (EGD) WITH PROPOFOL;  Surgeon: Arta Silence, MD;  Location: [facility];  Service: Endoscopy;  Laterality: N/A;  . Colonoscopy with propofol  10/06/2012    Procedure: COLONOSCOPY WITH PROPOFOL;  Surgeon: Arta Silence, MD;  Location: [facility];  Service: Endoscopy;  Laterality: N/A;  . Colonoscopy with propofol N/A 01/14/2013  Procedure: COLONOSCOPY WITH PROPOFOL;  Surgeon: Arta Silence, MD;  Location: [facility];  Service: Endoscopy;  Laterality: N/A;  . Portacath placement Right 01/22/2013  . Av fistula placement Left 07/24/2013    Procedure: ARTERIOVENOUS (AV) FISTULA CREATION- LEFT ;   Surgeon: Angelia Mould, MD;  Location: [facility];  Service: Vascular;  Laterality: Left;  . Abdominal hysterectomy  1980's  . Tonsillectomy  1954   Allergies:  Advicor; Crestor; Lipitor; Lovastatin; Morphine and related; Niaspan; Nsaids; Pravachol; Sulfa antibiotics; Vytorin; Welchol; Zetia; and Zocor Prior to Admit Meds:   Prior to Admission medications   Medication Sig Start Date End Date Taking? Authorizing Provider  dexamethasone (DECADRON) 4 MG tablet Take 2 tabs daily with a meal for 3 days starting the day after chemo. 09/07/13  Yes Volanda Napoleon, MD  docusate sodium 100 MG CAPS Take 200 mg by mouth 3 (three) times daily as needed for constipation. 01/27/13  Yes Volanda Napoleon, MD  esomeprazole (NEXIUM) 40 MG capsule Take 40 mg by mouth daily.    Yes Historical Provider, MD  famciclovir [facility]) 125 MG tablet Take 1 tablet on T-Th-Sat AFTER dialysis 09/07/13  Yes Volanda Napoleon, MD  fentaNYL (DURAGESIC - DOSED MCG/HR) 12 MCG/HR Place 1 patch (12.5 mcg total) onto the skin every 3 (three) days. 10/05/13  Yes Volanda Napoleon, MD  lidocaine-prilocaine (EMLA) cream Apply 1 application topically as needed (for port-a-cath access).   Yes Historical Provider, MD  loratadine (CLARITIN) 10 MG tablet Take 10 mg by mouth daily as needed for allergies.    Yes Historical Provider, MD  metoprolol tartrate (LOPRESSOR) 25 MG tablet Take 1 tablet (25 mg total) by mouth 2 (two) times daily. 11/04/13  Yes Delfina Redwood, MD  ondansetron (ZOFRAN-ODT) 8 MG disintegrating tablet TAKE 1 TABLET BY MOUTH EVERY 8 HOURS AS NEEDED FOR NAUSEA 11/11/13  Yes Volanda Napoleon, MD  oxyCODONE-acetaminophen (PERCOCET/ROXICET) 5-325 MG per tablet Take 1-2 tablets by mouth every 4 (four) hours as needed for moderate pain. 10/07/13  Yes Blair Promise, MD  RENVELA 800 MG tablet Take 800 mg by mouth 3 (three) times daily with meals.  09/17/13  Yes Historical Provider, MD  traMADol (ULTRAM) 50 MG tablet Take 50 mg by mouth  every 6 (six) hours as needed for moderate pain.   Yes Historical Provider, MD   Fam HX:    Family History  Problem Relation Age of Onset  . Cancer Mother     throat  . Alzheimer's disease Sister   . Cancer Brother     lymphoma  . Cancer Sister     unknown type  Brother -- Brother dead, Non-Hodgkins lymphoma  Father -- Father dead, Myocardial infarction, Diabetes mellitus  Mother -- Mother dead, Coronary Artery Disease  Sister -- Sister dead, Cancer  Sister -- Sister alive with problem, Dementia/Alzheimers  Social HX:    History   Social History  . Marital Status: Married    Spouse Name: N/A    Number of Children: N/A  . Years of Education: N/A   Occupational History  . Not on file.   Social History Main Topics  . Smoking status: Former Smoker -- 0.25 packs/day for 15 years    Types: Cigarettes    Start date: 11/25/1948    Quit date: 04/01/1964  . Smokeless tobacco: Never Used     Comment: quit 59 years ago  . Alcohol Use: No  . Drug Use: No  . Sexual Activity:  Not Currently   Other Topics Concern  . Not on file   Social History Narrative  . No narrative on file     ROS:  Denies any fevers, chills, orthopnea, PND. Positive previous chest pain, shortness of breath. All 11 ROS were addressed and are negative except what is stated in the HPI   Physical Exam: Blood pressure 141/67, pulse 131, resp. rate 28, SpO2 99.00%.   General: Well developed, well nourished, in no acute distress Head: Eyes PERRLA, No xanthomas.   Normal cephalic and atramatic  Lungs:   Clear bilaterally to auscultation and percussion. Normal respiratory effort. No wheezes, no rales. Heart:  Tachycardic, regular S1 S2 Pulses are 2+ & equal. No murmur, rubs, gallops.  No carotid bruit. No JVD.  No abdominal bruits.  Abdomen: Bowel sounds are positive, abdomen soft and non-tender without masses. No hepatosplenomegaly. Msk:  Back normal. Normal strength and tone for age. Extremities:  No  clubbing, cyanosis or edema.  DP +1, Port-A-Cath scar noted. Minor ecchymosis left forearm noted. Neuro: Alert and oriented X 3, non-focal, MAE x 4 GU: Deferred Rectal: Deferred Psych:  Good affect, responds appropriately      Labs: Lab Results  Component Value Date   WBC 3.3* 12/19/2013   HGB 12.6 12/19/2013   HCT 37.0 12/19/2013   MCV 105.6* 12/19/2013   PLT PENDING 12/19/2013     Recent Labs Lab 12/14/13 0900 12/19/13 1549  NA 137 139  K 4.2 3.1*  CL 96* 95*  CO2 33  --   BUN 23* 13  CREATININE 5.0* 2.70*  CALCIUM 9.0  --   PROT 6.2*  --   BILITOT 0.50  --   ALKPHOS 72  --   ALT 16  --   AST 17  --   GLUCOSE 109 92   No results found for this basename: CKTOTAL, CKMB, TROPONINI,  in the last 72 hours Lab Results  Component Value Date   CHOL  Value: 186        ATP III CLASSIFICATION:  <200     mg/dL   Desirable  200-239  mg/dL   Borderline High  >=240    mg/dL   High 06/28/2007   HDL 29* 06/28/2007   LDLCALC  Value: 126        Total Cholesterol/HDL:CHD Risk Coronary Heart Disease Risk Table                     Men   Women  1/2 Average Risk   3.4   3.3* 06/28/2007   TRIG 156* 06/28/2007   Lab Results  Component Value Date   DDIMER 0.57* 04/27/2011     Radiology:  Dg Chest Portable 1 View  12/19/2013   CLINICAL DATA:  Chest pain  EXAM: PORTABLE CHEST - 1 VIEW  COMPARISON:  11/03/2013  FINDINGS: Stable right IJ power port catheter and left IJ dialysis catheter positions. Normal heart size and vascularity. Negative for CHF or pneumonia. Chronic central bronchitic changes as before. No effusion or pneumothorax. Trachea is midline. Monitor leads overlie the chest.  IMPRESSION: Stable exam.  New superimposed acute process.   Electronically Signed   By: Daryll Brod M.D.   On: 12/19/2013 16:24   Personally viewed.  EKG:  Right branch block, 149, irregularly irregular, likely atrial fibrillation. Telemetry currently demonstrates heart rate of 125 beats per minute, fairly regular.  Personally viewed.   ASSESSMENT/PLAN:   73 year old female with previous history of atrial fibrillation,  nonobstructive CAD, end-stage renal disease on hemodialysis, lymphoma who experienced chest discomfort during periods of rapid ventricular response.  1. Atrial fibrillation-I discussed with her anticoagulation, and she states that she was taken off of this in the past possibly for anemia/thrombocytopenia in the midst of chemotherapy. I will defer ultimate decision once again to Dr. [name] but in the meantime, I will start heparin IV and warfarin per pharmacy. In his last office note he does discuss that she may need to be considered for systemic anticoagulation if she has recurrent arrhythmias. He also notes that if recurrent arrhythmias occur that he would place her on low-dose amiodarone. I think that this is a good idea. I will go ahead and start load with 400 mg by mouth twice a day. Watch for any signs of bradycardia, hypotension especially with underlying right bundle branch block. Now, I will give her 5 mg of IV Lopressor. Continue with IV diltiazem.  2. End-stage renal disease-per primary team. She does admit that she has missed some hemodialysis sessions. Perhaps fluctuation in fluid status has caused atrial stretch and subsequent arrhythmia propagation.  3. Recurrent diffuse large cell non-Hodgkin's lymphoma-Dr. Marin Olp. Platelet counts have been as low as 39,000 on 12/07/13. This will be troublesome with anticoagulation. Most recent platelet count was 127,000 on 12/14/13. Because of this, I will go ahead and start anticoagulation. It would be wise to coordinate with hematology as well.  4. Nonobstructive coronary artery disease-prior cardiac catheterization from 2012 reviewed. With chest pain, would recommend continuing to cycle troponin. It is possible that coronary artery disease has progressed since then. If we are able to control arrhythmia, tachycardia, we will likely be able to control  angina.  We will follow along with hospitalist team. I have discussed with Dr. Winfred Leeds in emergency department.  Candee Furbish, MD  12/19/2013  4:41 PM    Addendum: PLT's 40. Did not start heparin or warfarin. Appreciate pharmacy notification.

## 2013-12-19 NOTE — ED Provider Notes (Signed)
Patient developed anterior chest pressure during dialysis apparently one hour prior to coming here. Was found by EMS to be in atrial fibrillation with rapid ventricular response. History of aspirin en route. Denies shortness of breath no other associated symptoms. Presently is asymptomatic after treatment with Cardizem intravenous bolus and Cardizem intravenous drip while here. On exam no distress heart tachycardic irregularly irregular lungs clear auscultation abdomen nondistended nontender left upper extremity with dialysis fistula with good thrill. All 4 semis neurovascular intact.  Orlie Dakin, MD 12/20/13 [phone]

## 2013-12-19 NOTE — ED Notes (Signed)
Cardiology at bedside.

## 2013-12-20 DIAGNOSIS — I4891 Unspecified atrial fibrillation: Principal | ICD-10-CM

## 2013-12-20 LAB — CBC
HCT: 26.1 % — ABNORMAL LOW (ref 36.0–46.0)
HEMOGLOBIN: 8.5 g/dL — AB (ref 12.0–15.0)
MCH: 34.8 pg — AB (ref 26.0–34.0)
MCHC: 32.6 g/dL (ref 30.0–36.0)
MCV: 107 fL — AB (ref 78.0–100.0)
Platelets: 35 10*3/uL — ABNORMAL LOW (ref 150–400)
RBC: 2.44 MIL/uL — AB (ref 3.87–5.11)
RDW: 21.6 % — ABNORMAL HIGH (ref 11.5–15.5)
WBC: 2.5 10*3/uL — ABNORMAL LOW (ref 4.0–10.5)

## 2013-12-20 LAB — BASIC METABOLIC PANEL
BUN: 21 mg/dL (ref 6–23)
CHLORIDE: 97 meq/L (ref 96–112)
CO2: 31 meq/L (ref 19–32)
CREATININE: 3.82 mg/dL — AB (ref 0.50–1.10)
Calcium: 8.4 mg/dL (ref 8.4–10.5)
GFR calc Af Amer: 13 mL/min — ABNORMAL LOW (ref >90)
GFR calc non Af Amer: 11 mL/min — ABNORMAL LOW (ref >90)
Glucose, Bld: 106 mg/dL — ABNORMAL HIGH (ref 70–99)
Potassium: 4.3 mEq/L (ref 3.7–5.3)
Sodium: 139 mEq/L (ref 137–147)

## 2013-12-20 LAB — GLUCOSE, CAPILLARY
GLUCOSE-CAPILLARY: 107 mg/dL — AB (ref 70–99)
GLUCOSE-CAPILLARY: 141 mg/dL — AB (ref 70–99)
Glucose-Capillary: 82 mg/dL (ref 70–99)
Glucose-Capillary: 128 mg/dL — ABNORMAL HIGH (ref 70–99)

## 2013-12-20 LAB — IRON AND TIBC
IRON: 72 ug/dL (ref 42–135)
Saturation Ratios: 34 % (ref 20–55)
TIBC: 211 ug/dL — ABNORMAL LOW (ref 250–470)
UIBC: 139 ug/dL (ref 125–400)

## 2013-12-20 MED ORDER — SODIUM CHLORIDE 0.9 % IV SOLN: INTRAVENOUS | Status: DC

## 2013-12-20 MED ORDER — FENTANYL 12 MCG/HR TD PT72
12.5000 ug | MEDICATED_PATCH | TRANSDERMAL | Status: DC
  Administered 2013-12-20: 12.5 ug via TRANSDERMAL
  Filled 2013-12-20: qty 1

## 2013-12-20 MED ORDER — DILTIAZEM HCL 100 MG IV SOLR: 5.0000 mg/h | INTRAVENOUS | Status: DC

## 2013-12-20 NOTE — Progress Notes (Signed)
Upon bedside rounds at change of shift this am, Diana Zarsona, RN and [name], noted Fentanyl patch applied by Diana earlier in shift was missing. Patch applied per Diana near porta-cath site. Upon further inspection and clarification with patient and IV therapist, Diana Shire Messenheimer, RN, patch was removed and thrown away mistakenly when IV therapist accessed port. Will notify pharmacy.

## 2013-12-20 NOTE — Progress Notes (Signed)
[name] looks a lot better today. She's in a chair. She is back in normal sinus rhythm. She's on amiodarone and diltiazem.  Her hemoglobin is down to 8.5. She has quite a bit of bleeding from the AV fistula in the left arm.  Cardiology feels that anticoagulation is riskier than that if it. I can understand this. As such, she would not be put on anticoagulation.  Her platelet count is 35. This is no surprise. She chemotherapy 1 week ago so she in is at the nadir.  She's had no cough. There is no shortness of breath.  I am checking iron studies on her. With all of bleeding that she had to issue might be iron deficient. With her renal failure, she has very low erythropoietin level. She may need iron and Aranesp to help with her hemoglobin.  Her vital signs all look good. Heart rate is only 58. Blood pressure 154/78. I find nothing new on her examination. She has a little bit of swelling and ecchymoses in the left upper arm where her AV fistula is.  Her white cell count is also a little on the low side. We will check the white cell differential in the morning.  Hopefully, she will be able to go in and home in the morning.   Pete E.

## 2013-12-20 NOTE — Progress Notes (Addendum)
    Subjective:  Denies CP or dyspnea   Objective:  Filed Vitals:   12/19/13 2300 12/20/13 0000 12/20/13 0357 12/20/13 0741  BP: 135/35  132/43 153/50  Pulse: 54  54 58  Temp:  98.9 F (37.2 C) 98.4 F (36.9 C) 98.6 F (37 C)  TempSrc:  Oral Oral Oral  Resp: 12  18 20   Height:      Weight:      SpO2: 98%  100% 100%    Intake/Output from previous day:  Intake/Output Summary (Last 24 hours) at 12/20/13 0809 Last data filed at 12/20/13 0600  Gross per 24 hour  Intake 128.05 ml  Output      0 ml  Net 128.05 ml    Physical Exam: Physical exam: Well-developed well-nourished in no acute distress.  Skin is warm and dry.  HEENT is normal.  Neck is supple.  Chest is clear to auscultation with normal expansion.  Cardiovascular exam is regular rate and rhythm.  Abdominal exam nontender or distended. No masses palpated. Extremities show no edema. neuro grossly intact    Lab Results: Basic Metabolic Panel:  Recent Labs  12/19/13 1549 12/20/13 0330  NA 139 139  K 3.1* 4.3  CL 95* 97  CO2  --  31  GLUCOSE 92 106*  BUN 13 21  CREATININE 2.70* 3.82*  CALCIUM  --  8.4   CBC:  Recent Labs  12/19/13 1540 12/19/13 1549 12/20/13 0330  WBC 3.3*  --  2.5*  HGB 11.1* 12.6 8.5*  HCT 34.0* 37.0 26.1*  MCV 105.6*  --  107.0*  PLT 40*  --  35*     Assessment/Plan:  73 year old female with previous history of atrial fibrillation, nonobstructive CAD, end-stage renal disease on hemodialysis, lymphoma who experienced chest discomfort during periods of rapid ventricular response.  1. Atrial fibrillation-Patient back in sinus; continue amiodarone load. Patient is receiving chemotherapy for lymphoma and plt markedly decreased (35); I feel risk of anticoagulation outweighs benefit at this point; DC heparin and coumadin; no ASA for now. DC cardizem and continue metoprolol. 2. End-stage renal disease-per primary team.  3. Recurrent diffuse large cell non-Hodgkin's  lymphoma-Dr. Marin Olp.  4. Nonobstructive coronary artery disease-prior cardiac catheterization from 2012 reviewed. Await FU enzymes Patient can be transferred to telemetry from a cardiac standpoint and most likely DCed in AM if FU enzymes negative.   Kirk Ruths 12/20/2013, 8:09 AM

## 2013-12-20 NOTE — ED Provider Notes (Signed)
I have personally seen and examined the patient.  I have discussed the plan of care with the resident.  I have reviewed the documentation on PMH/FH/Soc. History.  I have reviewed the documentation of the resident and agree.  Orlie Dakin, MD 12/20/13 [phone]

## 2013-12-20 NOTE — Progress Notes (Addendum)
[facility] PROGRESS NOTE  [name] JZP:[id number] DOB: April 29, 1941 DOA: 12/19/2013 PCP: [name], MD  Assessment/Plan:  Atrial fibrillation with RVR - In NSR and on Amio - Anticoagulation medication discontinued addendum: no anticoagulation recommended given presumed higher risk of adverse effect on anticoagulants. - Cardiology on board and managing.  Active Problems:   Diabetes mellitus type 2, noninsulin dependent - Diabetic diet for control    ESRD on hemodialysis  - Will defer to nephrologist - Pt is currently on Tuesday, Thursday, Saturday schedule at home.  DVT prophylaxis - SCD's  Code Status: full Family Communication: No family at bedside, discussed directly with patient Disposition Plan: Once cleared for discharge by cardiology   Consultants:  Cardiology  Oncology  Nephrology  Procedures:  None  Antibiotics:  None  HPI/Subjective: No new complaints. Denies any chest discomfort or shortness of breath.  Objective: Filed Vitals:   12/20/13 0741  BP: 153/50  Pulse: 58  Temp: 98.6 F (37 C)  Resp: 20    Intake/Output Summary (Last 24 hours) at 12/20/13 1059 Last data filed at 12/20/13 4801  Gross per 24 hour  Intake 166.83 ml  Output    250 ml  Net -83.17 ml   Filed Weights   12/19/13 1855  Weight: 56.6 kg (124 lb 12.5 oz)    Exam:   General:  Pt in NAD, Alert and awake  Cardiovascular: RRR, no MRG  Respiratory: CTA BL, no wheezes  Abdomen: soft, NT, ND  Musculoskeletal: no cyanosis or clubbing   Data Reviewed: Basic Metabolic Panel:  Recent Labs Lab 12/14/13 0900 12/19/13 1549 12/20/13 0330  NA 137 139 139  K 4.2 3.1* 4.3  CL 96* 95* 97  CO2 33  --  31  GLUCOSE 109 92 106*  BUN 23* 13 21  CREATININE 5.0* 2.70* 3.82*  CALCIUM 9.0  --  8.4   Liver Function Tests:  Recent Labs Lab 12/14/13 0900  AST 17  ALT 16  ALKPHOS 72  BILITOT 0.50  PROT 6.2*   No results found for this basename:  LIPASE, AMYLASE,  in the last 168 hours No results found for this basename: AMMONIA,  in the last 168 hours CBC:  Recent Labs Lab 12/14/13 0900 12/19/13 1540 12/19/13 1549 12/20/13 0330  WBC 5.4 3.3*  --  2.5*  NEUTROABS 1.6  --   --   --   HGB 9.6* 11.1* 12.6 8.5*  HCT 31.7* 34.0* 37.0 26.1*  MCV 110* 105.6*  --  107.0*  PLT 127* 40*  --  35*   Cardiac Enzymes: No results found for this basename: CKTOTAL, CKMB, CKMBINDEX, TROPONINI,  in the last 168 hours BNP (last 3 results)  Recent Labs  11/03/13 1655  PROBNP 6465.0*   CBG:  Recent Labs Lab 12/19/13 2029 12/20/13 0744  GLUCAP 125* 82    Recent Results (from the past 240 hour(s))  MRSA PCR SCREENING     Status: None   Collection Time    12/19/13  7:02 PM      Result Value Ref Range Status   MRSA by PCR NEGATIVE  NEGATIVE Final   Comment:            The GeneXpert MRSA Assay (FDA     approved for NASAL specimens     only), is one component of a     comprehensive MRSA colonization     surveillance program. It is not     intended to diagnose MRSA  infection nor to guide or     monitor treatment for     MRSA infections.     Studies: Dg Chest Portable 1 View  12/19/2013   CLINICAL DATA:  Chest pain  EXAM: PORTABLE CHEST - 1 VIEW  COMPARISON:  11/03/2013  FINDINGS: Stable right IJ power port catheter and left IJ dialysis catheter positions. Normal heart size and vascularity. Negative for CHF or pneumonia. Chronic central bronchitic changes as before. No effusion or pneumothorax. Trachea is midline. Monitor leads overlie the chest.  IMPRESSION: Stable exam.  New superimposed acute process.   Electronically Signed   By: Daryll Brod M.D.   On: 12/19/2013 16:24    Scheduled Meds: . amiodarone  400 mg Oral BID  . fentaNYL  12.5 mcg Transdermal Q72H  . metoprolol tartrate  25 mg Oral BID  . pantoprazole  80 mg Oral Q1200  . sevelamer carbonate  800 mg Oral TID WC  . sodium chloride  3 mL Intravenous Q12H  .  sodium chloride  3 mL Intravenous Q12H   Continuous Infusions: . sodium chloride Stopped (12/20/13 0821)    Time spent: > 35 minutes    Velvet Bathe  [facility] Pager [phone]. If 7PM-7AM, please contact night-coverage at [email or web address], password [email or web address] 12/20/2013, 10:59 AM  LOS: 1 day

## 2013-12-21 ENCOUNTER — Ambulatory Visit: Payer: Medicare Other | Admitting: Hematology & Oncology

## 2013-12-21 ENCOUNTER — Other Ambulatory Visit: Payer: Medicare Other | Admitting: Lab

## 2013-12-21 ENCOUNTER — Ambulatory Visit: Payer: Medicare Other

## 2013-12-21 DIAGNOSIS — N19 Unspecified kidney failure: Secondary | ICD-10-CM

## 2013-12-21 DIAGNOSIS — D709 Neutropenia, unspecified: Secondary | ICD-10-CM

## 2013-12-21 LAB — CBC WITH DIFFERENTIAL/PLATELET
BASOS PCT: 1 % (ref 0–1)
Basophils Absolute: 0 10*3/uL (ref 0.0–0.1)
EOS ABS: 0 10*3/uL (ref 0.0–0.7)
Eosinophils Relative: 2 % (ref 0–5)
HCT: 25.8 % — ABNORMAL LOW (ref 36.0–46.0)
HEMOGLOBIN: 8.3 g/dL — AB (ref 12.0–15.0)
LYMPHS PCT: 76 % — AB (ref 12–46)
Lymphs Abs: 1.5 10*3/uL (ref 0.7–4.0)
MCH: 34 pg (ref 26.0–34.0)
MCHC: 32.2 g/dL (ref 30.0–36.0)
MCV: 105.7 fL — ABNORMAL HIGH (ref 78.0–100.0)
Monocytes Absolute: 0.3 10*3/uL (ref 0.1–1.0)
Monocytes Relative: 13 % — ABNORMAL HIGH (ref 3–12)
NEUTROS ABS: 0.2 10*3/uL — AB (ref 1.7–7.7)
Neutrophils Relative %: 8 % — ABNORMAL LOW (ref 43–77)
Platelets: 25 10*3/uL — CL (ref 150–400)
RBC: 2.44 MIL/uL — ABNORMAL LOW (ref 3.87–5.11)
RDW: 20.6 % — ABNORMAL HIGH (ref 11.5–15.5)
WBC: 2 10*3/uL — ABNORMAL LOW (ref 4.0–10.5)

## 2013-12-21 LAB — GLUCOSE, CAPILLARY: GLUCOSE-CAPILLARY: 89 mg/dL (ref 70–99)

## 2013-12-21 LAB — FERRITIN: FERRITIN: 2730 ng/mL — AB (ref 10–291)

## 2013-12-21 MED ORDER — AMIODARONE HCL 400 MG PO TABS: 400.0000 mg | ORAL_TABLET | Freq: Two times a day (BID) | ORAL | Status: DC

## 2013-12-21 MED ORDER — LEVOFLOXACIN 500 MG PO TABS: 500.0000 mg | ORAL_TABLET | ORAL | Status: DC

## 2013-12-21 MED ORDER — HEPARIN SOD (PORK) LOCK FLUSH 100 UNIT/ML IV SOLN
500.0000 [IU] | INTRAVENOUS | Status: AC | PRN
  Administered 2013-12-21: 500 [IU]

## 2013-12-21 MED ORDER — LEVOFLOXACIN 750 MG PO TABS
750.0000 mg | ORAL_TABLET | Freq: Once | ORAL | Status: AC
  Administered 2013-12-21: 750 mg via ORAL
  Filled 2013-12-21: qty 1

## 2013-12-21 MED ORDER — LEVOFLOXACIN 500 MG PO TABS: 500.0000 mg | ORAL_TABLET | ORAL | Status: AC

## 2013-12-21 MED ORDER — AMIODARONE HCL 200 MG PO TABS: 200.0000 mg | ORAL_TABLET | Freq: Every day | ORAL | Status: DC

## 2013-12-21 MED ORDER — FILGRASTIM 480 MCG/1.6ML IJ SOLN
480.0000 ug | Freq: Once | INTRAMUSCULAR | Status: AC
  Administered 2013-12-21: 480 ug via SUBCUTANEOUS
  Filled 2013-12-21 (×2): qty 1.6

## 2013-12-21 NOTE — Progress Notes (Signed)
The  CBC shows that the WBC are going down.  She is Neutropenic.  I will give her a dose of Neupogen and put her prophylacticly on Levaquin.  Her platelets are down, but no bleeding.  Still in NSR.  She can be discharged from my point of view as being in the hospital will increase her risk of infection.  Lum Keas  Proverbs 21:21

## 2013-12-21 NOTE — Progress Notes (Signed)
CRITICAL VALUE ALERT  Critical value received:  Platelet Count 25  Date of notification:  12/21/2013   Time of notification:  0640  Critical value read back:yes  Nurse who received alert:  Mike Craze   MD notified (1st page):  Re[place]([facility])  Time of first page:  0645  MD notified (2nd page):  Time of second page:  Responding MD:    Time MD responded:

## 2013-12-21 NOTE — Discharge Summary (Signed)
Physician Discharge Summary  [name] ENI:[id number] DOB: [birth year] DOA: 12/19/2013  PCP: [name], MD  Admit date: 12/19/2013 Discharge date: 12/21/2013  Time spent: > 35 minutes  Recommendations for Outpatient Follow-up:  Please be sure to reassess cbc on follow up Patient will be discharged on Amiodarone and Levaquin  Discharge Diagnoses:  Active Problems:   Diabetes mellitus type 2, noninsulin dependent   ESRD on hemodialysis   Atrial fibrillation with RVR   Discharge Condition: stable  Diet recommendation: Low sodium heart healthy/Carb modified  Filed Weights   12/19/13 1855  Weight: 56.6 kg (124 lb 12.5 oz)    History of present illness:  Patient is a 73 y/o with history of end-stage renal disease on hemodialysis, DM, non-Hodgkin lymphoma, end-stage renal disease on hemodialysis schedule Tuesday, Thursday, Saturday. She presented to the ED after she was found to have atrial fibrillation with RVR while at dialysis   Hospital Course:  Atrial fibrillation with RVR  - In NSR and on Amio  - Anticoagulation medication discontinued and  - Cardiology on board and recommended the following: Amiodarone 400 mg po bid for the next 2 weeks then Amiodarone 200 mg po daily Will continue metoprolol Avoid anticoagulation given higher risk of adverse effects secondary to worsening thrombocytopenia and on going pancytopenia  Active Problems:  Diabetes mellitus type 2, noninsulin dependent  - Diabetic diet for control   ESRD on hemodialysis  - Will defer to nephrologist  - Pt is currently on Tuesday, Thursday, Saturday schedule at home.   Large cell non hodgkin lymphoma  - Patient to continue routine followup with oncologist once discharged  - Oncology was on board while patient in house. - Most likely cause of thrombocytopenia   Procedures:  None  Consultations:  Oncology   Cardiology  Discharge Exam: Filed Vitals:   12/21/13 0802  BP: 164/51   Pulse: 60  Temp: 98 F (36.7 C)  Resp: 16    General: Pt in NAD, Alert and awake Cardiovascular: RRR, no MRG Respiratory: CTA BL, no wheezes  Discharge Instructions  Discharge Orders   Future Appointments Provider Department Dept Phone   01/04/2014 8:15 AM Gwendolyn [place] [phone]   01/04/2014 8:30 AM Volanda Napoleon, MD [place] [phone]   01/04/2014 9:00 AM [facility] [place] [phone]   01/18/2014 8:00 AM Gwendolyn [place] [phone]   01/18/2014 8:30 AM Volanda Napoleon, MD [place] [phone]   01/18/2014 9:30 AM [facility] [place] [phone]   Future Orders Complete By Expires   Call MD for:  difficulty breathing, headache or visual disturbances  As directed    Call MD for:  extreme fatigue  As directed    Call MD for:  redness, tenderness, or signs of infection (pain, swelling, redness, odor or green/yellow discharge around incision site)  As directed    Call MD for:  severe uncontrolled pain  As directed    Call MD for:  temperature >100.4  As directed    Diet - low sodium heart healthy  As directed    Discharge instructions  As directed    Comments:     Patient will need a cbc rechecked within the next 3-4 days.   Increase activity slowly  As directed  Medication List         amiodarone 400 MG tablet  Commonly known as:  PACERONE  Take 1 tablet (400 mg total) by mouth 2 (two) times daily.     dexamethasone 4 MG tablet  Commonly known as:  DECADRON  Take 2 tabs daily with a meal for 3 days starting the day after chemo.     DSS 100 MG Caps  Take 200 mg by mouth 3 (three) times daily as needed for constipation.     esomeprazole 40 MG capsule  Commonly known as:  NEXIUM  Take 40 mg by mouth daily.     famciclovir 125 MG tablet   Commonly known as:  FAMVIR  Take 1 tablet on T-Th-Sat AFTER dialysis     fentaNYL 12 MCG/HR  Commonly known as:  DURAGESIC - dosed mcg/hr  Place 1 patch (12.5 mcg total) onto the skin every 3 (three) days.     levofloxacin 500 MG tablet  Commonly known as:  LEVAQUIN  Take 1 tablet (500 mg total) by mouth every other day.  Start taking on:  12/25/2013     lidocaine-prilocaine cream  Commonly known as:  EMLA  Apply 1 application topically as needed (for port-a-cath access).     loratadine 10 MG tablet  Commonly known as:  CLARITIN  Take 10 mg by mouth daily as needed for allergies.     metoprolol tartrate 25 MG tablet  Commonly known as:  LOPRESSOR  Take 1 tablet (25 mg total) by mouth 2 (two) times daily.     ondansetron 8 MG disintegrating tablet  Commonly known as:  ZOFRAN-ODT  TAKE 1 TABLET BY MOUTH EVERY 8 HOURS AS NEEDED FOR NAUSEA     oxyCODONE-acetaminophen 5-325 MG per tablet  Commonly known as:  PERCOCET/ROXICET  Take 1-2 tablets by mouth every 4 (four) hours as needed for moderate pain.     RENVELA 800 MG tablet  Generic drug:  sevelamer carbonate  Take 800 mg by mouth 3 (three) times daily with meals.     traMADol 50 MG tablet  Commonly known as:  ULTRAM  Take 50 mg by mouth every 6 (six) hours as needed for moderate pain.       Allergies  Allergen Reactions  . Advicor [Niacin-Lovastatin Er]     Muscle cramps  . Crestor [Rosuvastatin]     Muscle cramps  . Lipitor [Atorvastatin]     Muscle cramps  . Lovastatin     Muscle cramps  . Morphine And Related Other (See Comments)    "made me crazy"  . Niaspan [Niacin Er]     Muscle cramps  . Nsaids     Gi side effects  . Pravachol [Pravastatin Sodium]     Gi side effects  . Sulfa Antibiotics Other (See Comments)    Can't remember   . Vytorin [Ezetimibe-Simvastatin] Nausea Only  . Welchol [Colesevelam Hcl]     Muscle cramps  . Zetia [Ezetimibe]     Muscle cramps   . Zocor [Simvastatin]     Muscle  cramps       The results of significant diagnostics from this hospitalization (including imaging, microbiology, ancillary and laboratory) are listed below for reference.    Significant Diagnostic Studies: Dg Chest Portable 1 View  12/19/2013   CLINICAL DATA:  Chest pain  EXAM: PORTABLE CHEST - 1 VIEW  COMPARISON:  11/03/2013  FINDINGS: Stable right IJ power port catheter and left IJ dialysis catheter positions. Normal heart  size and vascularity. Negative for CHF or pneumonia. Chronic central bronchitic changes as before. No effusion or pneumothorax. Trachea is midline. Monitor leads overlie the chest.  IMPRESSION: Stable exam.  New superimposed acute process.   Electronically Signed   By: Daryll Brod M.D.   On: 12/19/2013 16:24    Microbiology: Recent Results (from the past 240 hour(s))  MRSA PCR SCREENING     Status: None   Collection Time    12/19/13  7:02 PM      Result Value Ref Range Status   MRSA by PCR NEGATIVE  NEGATIVE Final   Comment:            The GeneXpert MRSA Assay (FDA     approved for NASAL specimens     only), is one component of a     comprehensive MRSA colonization     surveillance program. It is not     intended to diagnose MRSA     infection nor to guide or     monitor treatment for     MRSA infections.     Labs: Basic Metabolic Panel:  Recent Labs Lab 12/14/13 0900 12/19/13 1549 12/20/13 0330  NA 137 139 139  K 4.2 3.1* 4.3  CL 96* 95* 97  CO2 33  --  31  GLUCOSE 109 92 106*  BUN 23* 13 21  CREATININE 5.0* 2.70* 3.82*  CALCIUM 9.0  --  8.4   Liver Function Tests:  Recent Labs Lab 12/14/13 0900  AST 17  ALT 16  ALKPHOS 72  BILITOT 0.50  PROT 6.2*   No results found for this basename: LIPASE, AMYLASE,  in the last 168 hours No results found for this basename: AMMONIA,  in the last 168 hours CBC:  Recent Labs Lab 12/14/13 0900 12/19/13 1540 12/19/13 1549 12/20/13 0330 12/21/13 0415  WBC 5.4 3.3*  --  2.5* 2.0*  NEUTROABS  1.6  --   --   --  0.2*  HGB 9.6* 11.1* 12.6 8.5* 8.3*  HCT 31.7* 34.0* 37.0 26.1* 25.8*  MCV 110* 105.6*  --  107.0* 105.7*  PLT 127* 40*  --  35* 25*   Cardiac Enzymes: No results found for this basename: CKTOTAL, CKMB, CKMBINDEX, TROPONINI,  in the last 168 hours BNP: BNP (last 3 results)  Recent Labs  11/03/13 1655  PROBNP 6465.0*   CBG:  Recent Labs Lab 12/20/13 0744 12/20/13 1154 12/20/13 1643 12/20/13 2109 12/21/13 0802  GLUCAP 82 107* 128* 141* 89     Signed:  Velvet Bathe  [facility] 12/21/2013, 8:55 AM

## 2013-12-21 NOTE — Progress Notes (Signed)
Discharged home accompanied by daughter, discharge instructions and prescription given to pt. Belongings taken home.

## 2013-12-21 NOTE — Progress Notes (Signed)
Utilization Review Completed.Donne Anon T2/23/2015

## 2013-12-22 LAB — PATHOLOGIST SMEAR REVIEW

## 2013-12-31 ENCOUNTER — Other Ambulatory Visit: Payer: Self-pay | Admitting: Hematology & Oncology

## 2014-01-04 ENCOUNTER — Other Ambulatory Visit (HOSPITAL_BASED_OUTPATIENT_CLINIC_OR_DEPARTMENT_OTHER): Payer: Medicare Other | Admitting: Lab

## 2014-01-04 ENCOUNTER — Ambulatory Visit (HOSPITAL_BASED_OUTPATIENT_CLINIC_OR_DEPARTMENT_OTHER): Payer: Medicare Other | Admitting: Hematology & Oncology

## 2014-01-04 ENCOUNTER — Encounter: Payer: Self-pay | Admitting: Hematology & Oncology

## 2014-01-04 ENCOUNTER — Ambulatory Visit (HOSPITAL_BASED_OUTPATIENT_CLINIC_OR_DEPARTMENT_OTHER): Payer: Medicare Other

## 2014-01-04 ENCOUNTER — Other Ambulatory Visit: Payer: Self-pay | Admitting: Nurse Practitioner

## 2014-01-04 VITALS — BP 173/53 | HR 57 | Temp 98.1°F | Resp 14 | Ht 64.0 in | Wt 121.0 lb

## 2014-01-04 DIAGNOSIS — C8330 Diffuse large B-cell lymphoma, unspecified site: Secondary | ICD-10-CM

## 2014-01-04 DIAGNOSIS — C8589 Other specified types of non-Hodgkin lymphoma, extranodal and solid organ sites: Secondary | ICD-10-CM

## 2014-01-04 LAB — CBC WITH DIFFERENTIAL (CANCER CENTER ONLY)
BASO#: 0 10*3/uL (ref 0.0–0.2)
BASO%: 1.2 % (ref 0.0–2.0)
EOS ABS: 0.1 10*3/uL (ref 0.0–0.5)
EOS%: 3.6 % (ref 0.0–7.0)
HCT: 30.2 % — ABNORMAL LOW (ref 34.8–46.6)
HEMOGLOBIN: 9.4 g/dL — AB (ref 11.6–15.9)
LYMPH#: 1.6 10*3/uL (ref 0.9–3.3)
LYMPH%: 47.3 % (ref 14.0–48.0)
MCH: 34.9 pg — ABNORMAL HIGH (ref 26.0–34.0)
MCHC: 31.1 g/dL — ABNORMAL LOW (ref 32.0–36.0)
MCV: 112 fL — AB (ref 81–101)
MONO#: 1 10*3/uL — AB (ref 0.1–0.9)
MONO%: 29 % — ABNORMAL HIGH (ref 0.0–13.0)
NEUT%: 18.9 % — ABNORMAL LOW (ref 39.6–80.0)
NEUTROS ABS: 0.6 10*3/uL — AB (ref 1.5–6.5)
PLATELETS: 85 10*3/uL — AB (ref 145–400)
RBC: 2.69 10*6/uL — ABNORMAL LOW (ref 3.70–5.32)
RDW: 19.6 % — ABNORMAL HIGH (ref 11.1–15.7)
WBC: 3.3 10*3/uL — AB (ref 3.9–10.0)

## 2014-01-04 LAB — CMP (CANCER CENTER ONLY)
ALT(SGPT): 9 U/L — ABNORMAL LOW (ref 10–47)
AST: 16 U/L (ref 11–38)
Albumin: 3.3 g/dL (ref 3.3–5.5)
Alkaline Phosphatase: 64 U/L (ref 26–84)
BUN, Bld: 22 mg/dL (ref 7–22)
CALCIUM: 8.8 mg/dL (ref 8.0–10.3)
CO2: 33 meq/L (ref 18–33)
Chloride: 98 mEq/L (ref 98–108)
Creat: 4.8 mg/dl (ref 0.6–1.2)
Glucose, Bld: 108 mg/dL (ref 73–118)
POTASSIUM: 3.5 meq/L (ref 3.3–4.7)
SODIUM: 139 meq/L (ref 128–145)
TOTAL PROTEIN: 6 g/dL — AB (ref 6.4–8.1)
Total Bilirubin: 0.5 mg/dl (ref 0.20–1.60)

## 2014-01-04 LAB — IRON AND TIBC CHCC
%SAT: 36 % (ref 21–57)
IRON: 71 ug/dL (ref 41–142)
TIBC: 200 ug/dL — ABNORMAL LOW (ref 236–444)
UIBC: 129 ug/dL (ref 120–384)

## 2014-01-04 LAB — FERRITIN CHCC: Ferritin: 1373 ng/ml — ABNORMAL HIGH (ref 9–269)

## 2014-01-04 MED ORDER — SODIUM CHLORIDE 0.9 % IJ SOLN
10.0000 mL | INTRAMUSCULAR | Status: DC | PRN
  Administered 2014-01-04: 10 mL via INTRAVENOUS
  Filled 2014-01-04: qty 10

## 2014-01-04 MED ORDER — HEPARIN SOD (PORK) LOCK FLUSH 100 UNIT/ML IV SOLN
500.0000 [IU] | Freq: Once | INTRAVENOUS | Status: AC
  Administered 2014-01-04: 500 [IU] via INTRAVENOUS
  Filled 2014-01-04: qty 5

## 2014-01-04 NOTE — Progress Notes (Signed)
Per Dr. Marin Olp pt will not receive treatment today

## 2014-01-07 NOTE — Progress Notes (Signed)
Hematology and Oncology Follow Up Visit  [name] [id number] 11-04-1940 73 y.o. 01/07/2014   Principle Diagnosis:   Recurrent diffuse large cell non-Hodgkin's lymphoma  Chronic renal failure on hemodialysis  Paroxysmal atrial fibrillation  Current Therapy:   Status post 7 cycles of chemotherapy with R-gemcitabine/oxaliplatin          terim Historin:  Ms.  [name] is  in for followup. She is doing okay. She still feels a little weak. We held her chemotherapy for the next week or so.  Her dialysis is doing okay. She's had no bleeding. She's had no fever. She's had no nausea vomiting.  She's not noted any swollen glands on the right side of her face.   Medications: Current outpatient prescriptions:amiodarone (PACERONE) 200 MG tablet, Take 200 mg by mouth daily., Disp: , Rfl: ;  dexamethasone (DECADRON) 4 MG tablet, Take 2 tabs daily with a meal for 3 days starting the day after chemo., Disp: 24 tablet, Rfl: 2;  docusate sodium 100 MG CAPS, Take 200 mg by mouth 3 (three) times daily as needed for constipation., Disp: 60 capsule, Rfl: 2 esomeprazole (NEXIUM) 40 MG capsule, Take 40 mg by mouth daily. , Disp: , Rfl: ;  famciclovir (FAMVIR) 125 MG tablet, Take 1 tablet on T-Th-Sat AFTER dialysis, Disp: 30 tablet, Rfl: 2;  fentaNYL (DURAGESIC - DOSED MCG/HR) 12 MCG/HR, Place 1 patch (12.5 mcg total) onto the skin every 3 (three) days., Disp: 10 patch, Rfl: 0;  lidocaine-prilocaine (EMLA) cream, Apply 1 application topically as needed (for port-a-cath access)., Disp: , Rfl:  metoprolol tartrate (LOPRESSOR) 25 MG tablet, Take 1 tablet (25 mg total) by mouth 2 (two) times daily., Disp: 60 tablet, Rfl: 0;  ondansetron (ZOFRAN-ODT) 8 MG disintegrating tablet, DISSOLVE ONE TABLET BY MOUTH EVERY 8 HOURS AS NEEDED FOR NAUSEA., Disp: 20 tablet, Rfl: 0;  RENVELA 800 MG tablet, Take 800 mg by mouth 3 (three) times daily with meals. , Disp: , Rfl:  traMADol (ULTRAM) 50 MG tablet, Take 50 mg by  mouth every 6 (six) hours as needed for moderate pain., Disp: , Rfl: ;  loratadine (CLARITIN) 10 MG tablet, Take 10 mg by mouth daily as needed for allergies. , Disp: , Rfl:   Allergies:  Allergies  Allergen Reactions  . Advicor [Niacin-Lovastatin Er]     Muscle cramps  . Crestor [Rosuvastatin]     Muscle cramps  . Lipitor [Atorvastatin]     Muscle cramps  . Lovastatin     Muscle cramps  . Morphine And Related Other (See Comments)    "made me crazy"  . Niaspan [Niacin Er]     Muscle cramps  . Nsaids     Gi side effects  . Pravachol [Pravastatin Sodium]     Gi side effects  . Sulfa Antibiotics Other (See Comments)    Can't remember   . Vytorin [Ezetimibe-Simvastatin] Nausea Only  . Welchol [Colesevelam Hcl]     Muscle cramps  . Zetia [Ezetimibe]     Muscle cramps   . Zocor [Simvastatin]     Muscle cramps     Past Medical History, Surgical history, Social history, and Family History were reviewed and updated.  Review of Systems:  above  Physical Exam:  height is 5\' 4"  (1.626 m) and weight is 121 lb (54.885 kg). Her oral temperature is 98.1 F (36.7 C). Her blood pressure is 173/53 and her pulse is 57. Her respiration is 14.    no lymph nodes. Lungs clear. Cardiac  exam regular in rhythm. I cannot detect fibrillation. Abdomen soft. Good bowel sounds. No palpable liver or spleen. Neck is supple with no palpable lymph nodes. Face is with no mucositis. Extremities no clubbing cyanosis or edema.   Lab Results  Component Value Date   WBC 3.3* 01/04/2014   HGB 9.4* 01/04/2014   HCT 30.2* 01/04/2014   MCV 112* 01/04/2014   PLT 85* 01/04/2014     Chemistry      Component Value Date/Time   NA 139 01/04/2014 0832   NA 139 12/20/2013 0330   K 3.5 01/04/2014 0832   K 4.3 12/20/2013 0330   CL 98 01/04/2014 0832   CL 97 12/20/2013 0330   CO2 33 01/04/2014 0832   CO2 31 12/20/2013 0330   BUN 22 01/04/2014 0832   BUN 21 12/20/2013 0330   CREATININE 4.8* 01/04/2014 0832   CREATININE 3.82*  12/20/2013 0330      Component Value Date/Time   CALCIUM 8.8 01/04/2014 0832   CALCIUM 8.4 12/20/2013 0330   ALKPHOS 64 01/04/2014 0832   ALKPHOS 62 11/04/2013 0540   AST 16 01/04/2014 0832   AST 20 11/04/2013 0540   ALT 9* 01/04/2014 0832   ALT 9 11/04/2013 0540   BILITOT 0.50 01/04/2014 0832   BILITOT 0.3 11/04/2013 0540         Impression and Plan: [name] is  73 year old female. She is recurrent large cell lymphoma. She's responded to chemotherapy.  With a platelet count at 85, I will hold her chemotherapy.  We will plan to get her back in one more week for treatment.  I don't think she will need any scans probably for another couple months.    Volanda Napoleon, MD 3/12/20158:42 AM

## 2014-01-08 ENCOUNTER — Other Ambulatory Visit: Payer: Self-pay | Admitting: *Deleted

## 2014-01-08 DIAGNOSIS — C801 Malignant (primary) neoplasm, unspecified: Secondary | ICD-10-CM

## 2014-01-08 DIAGNOSIS — C8589 Other specified types of non-Hodgkin lymphoma, extranodal and solid organ sites: Secondary | ICD-10-CM

## 2014-01-08 DIAGNOSIS — C8330 Diffuse large B-cell lymphoma, unspecified site: Secondary | ICD-10-CM

## 2014-01-08 MED ORDER — FAMCICLOVIR 125 MG PO TABS: ORAL_TABLET | ORAL | Status: DC

## 2014-01-18 ENCOUNTER — Other Ambulatory Visit (HOSPITAL_BASED_OUTPATIENT_CLINIC_OR_DEPARTMENT_OTHER): Payer: Medicare Other | Admitting: Lab

## 2014-01-18 ENCOUNTER — Encounter: Payer: Self-pay | Admitting: Hematology & Oncology

## 2014-01-18 ENCOUNTER — Ambulatory Visit: Payer: Medicare Other

## 2014-01-18 ENCOUNTER — Ambulatory Visit (HOSPITAL_BASED_OUTPATIENT_CLINIC_OR_DEPARTMENT_OTHER): Payer: Medicare Other | Admitting: Hematology & Oncology

## 2014-01-18 VITALS — BP 158/45 | HR 52 | Temp 98.6°F | Resp 14 | Ht 64.0 in | Wt 127.0 lb

## 2014-01-18 DIAGNOSIS — C8330 Diffuse large B-cell lymphoma, unspecified site: Secondary | ICD-10-CM

## 2014-01-18 DIAGNOSIS — C8589 Other specified types of non-Hodgkin lymphoma, extranodal and solid organ sites: Secondary | ICD-10-CM

## 2014-01-18 LAB — MANUAL DIFFERENTIAL (CHCC SATELLITE)
ALC: 1.7 10*3/uL (ref 0.6–2.2)
ANC (CHCC HP manual diff): 0.1 10*3/uL — CL (ref 1.5–6.7)
BASO: 3 % — ABNORMAL HIGH (ref 0–2)
Eos: 8 % — ABNORMAL HIGH (ref 0–7)
LYMPH: 67 % — ABNORMAL HIGH (ref 14–48)
MONO: 20 % — ABNORMAL HIGH (ref 0–13)
Metamyelocytes: 1 % — ABNORMAL HIGH (ref 0–0)
PLT EST ~~LOC~~: DECREASED
SEG: 1 % — ABNORMAL LOW (ref 40–75)
nRBC: 2 % — ABNORMAL HIGH (ref 0–0)

## 2014-01-18 LAB — CMP (CANCER CENTER ONLY)
ALT(SGPT): 8 U/L — ABNORMAL LOW (ref 10–47)
AST: 14 U/L (ref 11–38)
Albumin: 3.3 g/dL (ref 3.3–5.5)
Alkaline Phosphatase: 63 U/L (ref 26–84)
BUN, Bld: 26 mg/dL — ABNORMAL HIGH (ref 7–22)
CO2: 35 meq/L — AB (ref 18–33)
Calcium: 8.8 mg/dL (ref 8.0–10.3)
Chloride: 93 mEq/L — ABNORMAL LOW (ref 98–108)
Creat: 5.6 mg/dl (ref 0.6–1.2)
Glucose, Bld: 89 mg/dL (ref 73–118)
Potassium: 3.9 mEq/L (ref 3.3–4.7)
Sodium: 137 mEq/L (ref 128–145)
TOTAL PROTEIN: 6.1 g/dL — AB (ref 6.4–8.1)
Total Bilirubin: 0.5 mg/dl (ref 0.20–1.60)

## 2014-01-18 LAB — CBC WITH DIFFERENTIAL (CANCER CENTER ONLY)
HCT: 36.5 % (ref 34.8–46.6)
HGB: 11.8 g/dL (ref 11.6–15.9)
MCH: 35.3 pg — ABNORMAL HIGH (ref 26.0–34.0)
MCHC: 32.3 g/dL (ref 32.0–36.0)
MCV: 109 fL — ABNORMAL HIGH (ref 81–101)
Platelets: 87 10*3/uL — ABNORMAL LOW (ref 145–400)
RBC: 3.34 10*6/uL — AB (ref 3.70–5.32)
RDW: 17.2 % — ABNORMAL HIGH (ref 11.1–15.7)
WBC: 2.6 10*3/uL — AB (ref 3.9–10.0)

## 2014-01-18 NOTE — Progress Notes (Signed)
Mrs.: [name] comes in for chemotherapy. She's had a 7 cycles of Rituxan/gemcitabine/oxeye platen.  Unfortunately, her blood count is too low today. Her white cell count is 2.6 with a neutrophil count of 0.1. Her blood count is a father.  To do okay on dialysis. She's having no problems with dialysis. These she's having some pain with a fistula site in the left upper arm.  She's had no problems with her atrial fibrillation. She sees be in sinus rhythm today.  She's eating okay. She's had no further swelling on the right of her face. Prevacid no cough. She's having no problems with constipation or diarrhea.  On physical exam, her vital signs are stable. Temperature 98.6. Her weight is a is 127 pounds. Blood pressure 158/45.  Lungs are clear. Cardiac exam regular in rhythm. Abdomen soft. Lymph node exam is negative. Face exam shows no swelling on the face. Extremities no clubbing cyanosis or edema. Skin exam no rashes.  Fourthly, we will have to hold her chemotherapy again today. I will have to make sure that she gets Neulasta with chemotherapy from now on.  It is possible that we're going to have to just hold further chemotherapy depending on her blood counts.  Her last PET scan was done back in January. We'll probably get another one set up for her after her cycle in April.  I spent a good half hour with his code and her family today.  This was dictated with voice recognition. As always, the system makes mistakes. I apologized for these mistakes. They are inadvertent.

## 2014-01-18 NOTE — Progress Notes (Signed)
No chemotherapy today per Dr. Marin Olp.

## 2014-01-25 ENCOUNTER — Ambulatory Visit (HOSPITAL_BASED_OUTPATIENT_CLINIC_OR_DEPARTMENT_OTHER): Payer: Medicare Other

## 2014-01-25 ENCOUNTER — Other Ambulatory Visit (HOSPITAL_BASED_OUTPATIENT_CLINIC_OR_DEPARTMENT_OTHER): Payer: Medicare Other | Admitting: Lab

## 2014-01-25 VITALS — BP 165/56 | HR 56 | Temp 97.0°F | Resp 18 | Wt 127.0 lb

## 2014-01-25 DIAGNOSIS — C8330 Diffuse large B-cell lymphoma, unspecified site: Secondary | ICD-10-CM

## 2014-01-25 DIAGNOSIS — Z5112 Encounter for antineoplastic immunotherapy: Secondary | ICD-10-CM

## 2014-01-25 DIAGNOSIS — Z5111 Encounter for antineoplastic chemotherapy: Secondary | ICD-10-CM

## 2014-01-25 DIAGNOSIS — C8589 Other specified types of non-Hodgkin lymphoma, extranodal and solid organ sites: Secondary | ICD-10-CM

## 2014-01-25 LAB — CBC WITH DIFFERENTIAL (CANCER CENTER ONLY)
BASO#: 0 10*3/uL (ref 0.0–0.2)
BASO%: 0.5 % (ref 0.0–2.0)
EOS ABS: 0.1 10*3/uL (ref 0.0–0.5)
EOS%: 2.6 % (ref 0.0–7.0)
HCT: 37.4 % (ref 34.8–46.6)
HGB: 12.1 g/dL (ref 11.6–15.9)
LYMPH#: 2.1 10*3/uL (ref 0.9–3.3)
LYMPH%: 49.1 % — AB (ref 14.0–48.0)
MCH: 35.3 pg — ABNORMAL HIGH (ref 26.0–34.0)
MCHC: 32.4 g/dL (ref 32.0–36.0)
MCV: 109 fL — ABNORMAL HIGH (ref 81–101)
MONO#: 0.7 10*3/uL (ref 0.1–0.9)
MONO%: 16.8 % — ABNORMAL HIGH (ref 0.0–13.0)
NEUT#: 1.3 10*3/uL — ABNORMAL LOW (ref 1.5–6.5)
NEUT%: 31 % — ABNORMAL LOW (ref 39.6–80.0)
Platelets: 57 10*3/uL — ABNORMAL LOW (ref 145–400)
RBC: 3.43 10*6/uL — ABNORMAL LOW (ref 3.70–5.32)
RDW: 17 % — AB (ref 11.1–15.7)
WBC: 4.3 10*3/uL (ref 3.9–10.0)

## 2014-01-25 MED ORDER — DEXAMETHASONE SODIUM PHOSPHATE 10 MG/ML IJ SOLN
10.0000 mg | Freq: Once | INTRAMUSCULAR | Status: AC
  Administered 2014-01-25: 10 mg via INTRAVENOUS

## 2014-01-25 MED ORDER — DEXAMETHASONE SODIUM PHOSPHATE 10 MG/ML IJ SOLN
INTRAMUSCULAR | Status: AC
  Filled 2014-01-25: qty 1

## 2014-01-25 MED ORDER — DIPHENHYDRAMINE HCL 25 MG PO CAPS
ORAL_CAPSULE | ORAL | Status: AC
  Filled 2014-01-25: qty 2

## 2014-01-25 MED ORDER — SODIUM CHLORIDE 0.9 % IV SOLN
375.0000 mg/m2 | Freq: Once | INTRAVENOUS | Status: AC
  Administered 2014-01-25: 600 mg via INTRAVENOUS
  Filled 2014-01-25: qty 60

## 2014-01-25 MED ORDER — ONDANSETRON 8 MG/50ML IVPB (CHCC)
8.0000 mg | Freq: Once | INTRAVENOUS | Status: AC
  Administered 2014-01-25: 8 mg via INTRAVENOUS

## 2014-01-25 MED ORDER — SODIUM CHLORIDE 0.9 % IV SOLN
600.0000 mg/m2 | Freq: Once | INTRAVENOUS | Status: AC
  Administered 2014-01-25: 950 mg via INTRAVENOUS
  Filled 2014-01-25: qty 25

## 2014-01-25 MED ORDER — ACETAMINOPHEN 325 MG PO TABS
ORAL_TABLET | ORAL | Status: AC
  Filled 2014-01-25: qty 2

## 2014-01-25 MED ORDER — OXALIPLATIN CHEMO INJECTION 100 MG/20ML
50.0000 mg/m2 | Freq: Once | INTRAVENOUS | Status: AC
  Administered 2014-01-25: 80 mg via INTRAVENOUS
  Filled 2014-01-25: qty 16

## 2014-01-25 MED ORDER — DIPHENHYDRAMINE HCL 25 MG PO CAPS
50.0000 mg | ORAL_CAPSULE | Freq: Once | ORAL | Status: AC
  Administered 2014-01-25: 50 mg via ORAL

## 2014-01-25 MED ORDER — DEXTROSE 5 % IV SOLN
Freq: Once | INTRAVENOUS | Status: AC
  Administered 2014-01-25: 09:00:00 via INTRAVENOUS

## 2014-01-25 MED ORDER — ACETAMINOPHEN 325 MG PO TABS
650.0000 mg | ORAL_TABLET | Freq: Once | ORAL | Status: AC
  Administered 2014-01-25: 650 mg via ORAL

## 2014-01-25 NOTE — Patient Instructions (Signed)
Rituximab injection What is this medicine? RITUXIMAB (ri TUX i mab) is a monoclonal antibody. This medicine changes the way the body's immune system works. It is used commonly to treat non-Hodgkin's lymphoma and other conditions. In cancer cells, this drug targets a specific protein within cancer cells and stops the cancer cells from growing. It is also used to treat rhuematoid arthritis (RA). In RA, this medicine slow the inflammatory process and help reduce joint pain and swelling. This medicine is often used with other cancer or arthritis medications. This medicine may be used for other purposes; ask your health care provider or pharmacist if you have questions. COMMON BRAND NAME(S): Rituxan What should I tell my health care provider before I take this medicine? They need to know if you have any of these conditions: -blood disorders -heart disease -history of hepatitis B -infection (especially a virus infection such as chickenpox, cold sores, or herpes) -irregular heartbeat -kidney disease -lung or breathing disease, like asthma -lupus -an unusual or allergic reaction to rituximab, mouse proteins, other medicines, foods, dyes, or preservatives -pregnant or trying to get pregnant -breast-feeding How should I use this medicine? This medicine is for infusion into a vein. It is administered in a hospital or clinic by a specially trained health care professional. A special MedGuide will be given to you by the pharmacist with each prescription and refill. Be sure to read this information carefully each time. Talk to your pediatrician regarding the use of this medicine in children. This medicine is not approved for use in children. Overdosage: If you think you have taken too much of this medicine contact a poison control center or emergency room at once. NOTE: This medicine is only for you. Do not share this medicine with others. What if I miss a dose? It is important not to miss a dose. Call  your doctor or health care professional if you are unable to keep an appointment. What may interact with this medicine? -cisplatin -medicines for blood pressure -some other medicines for arthritis -vaccines This list may not describe all possible interactions. Give your health care provider a list of all the medicines, herbs, non-prescription drugs, or dietary supplements you use. Also tell them if you smoke, drink alcohol, or use illegal drugs. Some items may interact with your medicine. What should I watch for while using this medicine? Report any side effects that you notice during your treatment right away, such as changes in your breathing, fever, chills, dizziness or lightheadedness. These effects are more common with the first dose. Visit your prescriber or health care professional for checks on your progress. You will need to have regular blood work. Report any other side effects. The side effects of this medicine can continue after you finish your treatment. Continue your course of treatment even though you feel ill unless your doctor tells you to stop. Call your doctor or health care professional for advice if you get a fever, chills or sore throat, or other symptoms of a cold or flu. Do not treat yourself. This drug decreases your body's ability to fight infections. Try to avoid being around people who are sick. This medicine may increase your risk to bruise or bleed. Call your doctor or health care professional if you notice any unusual bleeding. Be careful brushing and flossing your teeth or using a toothpick because you may get an infection or bleed more easily. If you have any dental work done, tell your dentist you are receiving this medicine. Avoid taking products   that contain aspirin, acetaminophen, ibuprofen, naproxen, or ketoprofen unless instructed by your doctor. These medicines may hide a fever. Do not become pregnant while taking this medicine. Women should inform their doctor  if they wish to become pregnant or think they might be pregnant. There is a potential for serious side effects to an unborn child. Talk to your health care professional or pharmacist for more information. Do not breast-feed an infant while taking this medicine. What side effects may I notice from receiving this medicine? Side effects that you should report to your doctor or health care professional as soon as possible: -allergic reactions like skin rash, itching or hives, swelling of the face, lips, or tongue -low blood counts - this medicine may decrease the number of white blood cells, red blood cells and platelets. You may be at increased risk for infections and bleeding. -signs of infection - fever or chills, cough, sore throat, pain or difficulty passing urine -signs of decreased platelets or bleeding - bruising, pinpoint red spots on the skin, black, tarry stools, blood in the urine -signs of decreased red blood cells - unusually weak or tired, fainting spells, lightheadedness -breathing problems -confused, not responsive -chest pain -fast, irregular heartbeat -feeling faint or lightheaded, falls -mouth sores -redness, blistering, peeling or loosening of the skin, including inside the mouth -stomach pain -swelling of the ankles, feet, or hands -trouble passing urine or change in the amount of urine Side effects that usually do not require medical attention (report to your doctor or other health care professional if they continue or are bothersome): -anxiety -headache -loss of appetite -muscle aches -nausea -night sweats This list may not describe all possible side effects. Call your doctor for medical advice about side effects. You may report side effects to FDA at 1-800-FDA-1088. Where should I keep my medicine? This drug is given in a hospital or clinic and will not be stored at home. NOTE: This sheet is a summary. It may not cover all possible information. If you have questions  about this medicine, talk to your doctor, pharmacist, or health care provider.  2014, [employer]. (2008-06-14 14:04:59) Oxaliplatin Injection What is this medicine? OXALIPLATIN (ox AL i PLA tin) is a chemotherapy drug. It targets fast dividing cells, like cancer cells, and causes these cells to die. This medicine is used to treat cancers of the colon and rectum, and many other cancers. This medicine may be used for other purposes; ask your health care provider or pharmacist if you have questions. COMMON BRAND NAME(S): Eloxatin What should I tell my health care provider before I take this medicine? They need to know if you have any of these conditions: -kidney disease -an unusual or allergic reaction to oxaliplatin, other chemotherapy, other medicines, foods, dyes, or preservatives -pregnant or trying to get pregnant -breast-feeding How should I use this medicine? This drug is given as an infusion into a vein. It is administered in a hospital or clinic by a specially trained health care professional. Talk to your pediatrician regarding the use of this medicine in children. Special care may be needed. Overdosage: If you think you have taken too much of this medicine contact a poison control center or emergency room at once. NOTE: This medicine is only for you. Do not share this medicine with others. What if I miss a dose? It is important not to miss a dose. Call your doctor or health care professional if you are unable to keep an appointment. What may interact with this  medicine? -medicines to increase blood counts like filgrastim, pegfilgrastim, sargramostim -probenecid -some antibiotics like amikacin, gentamicin, neomycin, polymyxin B, streptomycin, tobramycin -zalcitabine Talk to your doctor or health care professional before taking any of these medicines: -acetaminophen -aspirin -ibuprofen -ketoprofen -naproxen This list may not describe all possible interactions. Give  your health care provider a list of all the medicines, herbs, non-prescription drugs, or dietary supplements you use. Also tell them if you smoke, drink alcohol, or use illegal drugs. Some items may interact with your medicine. What should I watch for while using this medicine? Your condition will be monitored carefully while you are receiving this medicine. You will need important blood work done while you are taking this medicine. This medicine can make you more sensitive to cold. Do not drink cold drinks or use ice. Cover exposed skin before coming in contact with cold temperatures or cold objects. When out in cold weather wear warm clothing and cover your mouth and nose to warm the air that goes into your lungs. Tell your doctor if you get sensitive to the cold. This drug may make you feel generally unwell. This is not uncommon, as chemotherapy can affect healthy cells as well as cancer cells. Report any side effects. Continue your course of treatment even though you feel ill unless your doctor tells you to stop. In some cases, you may be given additional medicines to help with side effects. Follow all directions for their use. Call your doctor or health care professional for advice if you get a fever, chills or sore throat, or other symptoms of a cold or flu. Do not treat yourself. This drug decreases your body's ability to fight infections. Try to avoid being around people who are sick. This medicine may increase your risk to bruise or bleed. Call your doctor or health care professional if you notice any unusual bleeding. Be careful brushing and flossing your teeth or using a toothpick because you may get an infection or bleed more easily. If you have any dental work done, tell your dentist you are receiving this medicine. Avoid taking products that contain aspirin, acetaminophen, ibuprofen, naproxen, or ketoprofen unless instructed by your doctor. These medicines may hide a fever. Do not become  pregnant while taking this medicine. Women should inform their doctor if they wish to become pregnant or think they might be pregnant. There is a potential for serious side effects to an unborn child. Talk to your health care professional or pharmacist for more information. Do not breast-feed an infant while taking this medicine. Call your doctor or health care professional if you get diarrhea. Do not treat yourself. What side effects may I notice from receiving this medicine? Side effects that you should report to your doctor or health care professional as soon as possible: -allergic reactions like skin rash, itching or hives, swelling of the face, lips, or tongue -low blood counts - This drug may decrease the number of white blood cells, red blood cells and platelets. You may be at increased risk for infections and bleeding. -signs of infection - fever or chills, cough, sore throat, pain or difficulty passing urine -signs of decreased platelets or bleeding - bruising, pinpoint red spots on the skin, black, tarry stools, nosebleeds -signs of decreased red blood cells - unusually weak or tired, fainting spells, lightheadedness -breathing problems -chest pain, pressure -cough -diarrhea -jaw tightness -mouth sores -nausea and vomiting -pain, swelling, redness or irritation at the injection site -pain, tingling, numbness in the hands or feet -  problems with balance, talking, walking -redness, blistering, peeling or loosening of the skin, including inside the mouth -trouble passing urine or change in the amount of urine Side effects that usually do not require medical attention (report to your doctor or health care professional if they continue or are bothersome): -changes in vision -constipation -hair loss -loss of appetite -metallic taste in the mouth or changes in taste -stomach pain This list may not describe all possible side effects. Call your doctor for medical advice about side  effects. You may report side effects to FDA at 1-800-FDA-1088. Where should I keep my medicine? This drug is given in a hospital or clinic and will not be stored at home. NOTE: This sheet is a summary. It may not cover all possible information. If you have questions about this medicine, talk to your doctor, pharmacist, or health care provider.  2014, [employer]. (2008-05-11 17:22:47) Gemcitabine injection What is this medicine? GEMCITABINE (jem SIT a been) is a chemotherapy drug. This medicine is used to treat many types of cancer like breast cancer, lung cancer, pancreatic cancer, and ovarian cancer. This medicine may be used for other purposes; ask your health care provider or pharmacist if you have questions. COMMON BRAND NAME(S): Gemzar What should I tell my health care provider before I take this medicine? They need to know if you have any of these conditions: -blood disorders -infection -kidney disease -liver disease -recent or ongoing radiation therapy -an unusual or allergic reaction to gemcitabine, other chemotherapy, other medicines, foods, dyes, or preservatives -pregnant or trying to get pregnant -breast-feeding How should I use this medicine? This drug is given as an infusion into a vein. It is administered in a hospital or clinic by a specially trained health care professional. Talk to your pediatrician regarding the use of this medicine in children. Special care may be needed. Overdosage: If you think you have taken too much of this medicine contact a poison control center or emergency room at once. NOTE: This medicine is only for you. Do not share this medicine with others. What if I miss a dose? It is important not to miss your dose. Call your doctor or health care professional if you are unable to keep an appointment. What may interact with this medicine? -medicines to increase blood counts like filgrastim, pegfilgrastim, sargramostim -some other  chemotherapy drugs like cisplatin -vaccines Talk to your doctor or health care professional before taking any of these medicines: -acetaminophen -aspirin -ibuprofen -ketoprofen -naproxen This list may not describe all possible interactions. Give your health care provider a list of all the medicines, herbs, non-prescription drugs, or dietary supplements you use. Also tell them if you smoke, drink alcohol, or use illegal drugs. Some items may interact with your medicine. What should I watch for while using this medicine? Visit your doctor for checks on your progress. This drug may make you feel generally unwell. This is not uncommon, as chemotherapy can affect healthy cells as well as cancer cells. Report any side effects. Continue your course of treatment even though you feel ill unless your doctor tells you to stop. In some cases, you may be given additional medicines to help with side effects. Follow all directions for their use. Call your doctor or health care professional for advice if you get a fever, chills or sore throat, or other symptoms of a cold or flu. Do not treat yourself. This drug decreases your body's ability to fight infections. Try to avoid being around people who are  sick. This medicine may increase your risk to bruise or bleed. Call your doctor or health care professional if you notice any unusual bleeding. Be careful brushing and flossing your teeth or using a toothpick because you may get an infection or bleed more easily. If you have any dental work done, tell your dentist you are receiving this medicine. Avoid taking products that contain aspirin, acetaminophen, ibuprofen, naproxen, or ketoprofen unless instructed by your doctor. These medicines may hide a fever. Women should inform their doctor if they wish to become pregnant or think they might be pregnant. There is a potential for serious side effects to an unborn child. Talk to your health care professional or pharmacist  for more information. Do not breast-feed an infant while taking this medicine. What side effects may I notice from receiving this medicine? Side effects that you should report to your doctor or health care professional as soon as possible: -allergic reactions like skin rash, itching or hives, swelling of the face, lips, or tongue -low blood counts - this medicine may decrease the number of white blood cells, red blood cells and platelets. You may be at increased risk for infections and bleeding. -signs of infection - fever or chills, cough, sore throat, pain or difficulty passing urine -signs of decreased platelets or bleeding - bruising, pinpoint red spots on the skin, black, tarry stools, blood in the urine -signs of decreased red blood cells - unusually weak or tired, fainting spells, lightheadedness -breathing problems -chest pain -mouth sores -nausea and vomiting -pain, swelling, redness at site where injected -pain, tingling, numbness in the hands or feet -stomach pain -swelling of ankles, feet, hands -unusual bleeding Side effects that usually do not require medical attention (report to your doctor or health care professional if they continue or are bothersome): -constipation -diarrhea -hair loss -loss of appetite -stomach upset This list may not describe all possible side effects. Call your doctor for medical advice about side effects. You may report side effects to FDA at 1-800-FDA-1088. Where should I keep my medicine? This drug is given in a hospital or clinic and will not be stored at home. NOTE: This sheet is a summary. It may not cover all possible information. If you have questions about this medicine, talk to your doctor, pharmacist, or health care provider.  2014, [employer]. (2008-02-24 18:45:54)

## 2014-01-25 NOTE — Progress Notes (Signed)
Patient lab review by M.D. Doctor is ok with patient getting treatment done.

## 2014-02-08 ENCOUNTER — Ambulatory Visit: Payer: Medicare Other | Admitting: Hematology & Oncology

## 2014-02-08 ENCOUNTER — Ambulatory Visit: Payer: Medicare Other

## 2014-02-08 ENCOUNTER — Other Ambulatory Visit: Payer: Medicare Other | Admitting: Lab

## 2014-02-15 ENCOUNTER — Other Ambulatory Visit (HOSPITAL_BASED_OUTPATIENT_CLINIC_OR_DEPARTMENT_OTHER): Payer: Medicare Other | Admitting: Lab

## 2014-02-15 ENCOUNTER — Ambulatory Visit (HOSPITAL_BASED_OUTPATIENT_CLINIC_OR_DEPARTMENT_OTHER): Payer: Medicare Other | Admitting: Hematology & Oncology

## 2014-02-15 ENCOUNTER — Encounter: Payer: Self-pay | Admitting: Hematology & Oncology

## 2014-02-15 ENCOUNTER — Ambulatory Visit (HOSPITAL_BASED_OUTPATIENT_CLINIC_OR_DEPARTMENT_OTHER): Payer: Medicare Other

## 2014-02-15 VITALS — BP 174/60 | HR 52 | Temp 97.9°F | Resp 14 | Ht 65.0 in | Wt 126.0 lb

## 2014-02-15 VITALS — BP 165/67 | HR 58 | Temp 98.1°F | Resp 16

## 2014-02-15 DIAGNOSIS — C8330 Diffuse large B-cell lymphoma, unspecified site: Secondary | ICD-10-CM

## 2014-02-15 DIAGNOSIS — C8589 Other specified types of non-Hodgkin lymphoma, extranodal and solid organ sites: Secondary | ICD-10-CM

## 2014-02-15 DIAGNOSIS — Z5111 Encounter for antineoplastic chemotherapy: Secondary | ICD-10-CM

## 2014-02-15 DIAGNOSIS — I4891 Unspecified atrial fibrillation: Secondary | ICD-10-CM

## 2014-02-15 DIAGNOSIS — N186 End stage renal disease: Secondary | ICD-10-CM

## 2014-02-15 DIAGNOSIS — Z992 Dependence on renal dialysis: Secondary | ICD-10-CM

## 2014-02-15 LAB — CMP (CANCER CENTER ONLY)
ALK PHOS: 66 U/L (ref 26–84)
ALT: 12 U/L (ref 10–47)
AST: 19 U/L (ref 11–38)
Albumin: 3.8 g/dL (ref 3.3–5.5)
BILIRUBIN TOTAL: 0.4 mg/dL (ref 0.20–1.60)
BUN, Bld: 31 mg/dL — ABNORMAL HIGH (ref 7–22)
CO2: 32 mEq/L (ref 18–33)
CREATININE: 6.3 mg/dL — AB (ref 0.6–1.2)
Calcium: 9.2 mg/dL (ref 8.0–10.3)
Chloride: 95 mEq/L — ABNORMAL LOW (ref 98–108)
Glucose, Bld: 102 mg/dL (ref 73–118)
Potassium: 5 mEq/L — ABNORMAL HIGH (ref 3.3–4.7)
Sodium: 140 mEq/L (ref 128–145)
Total Protein: 6.2 g/dL — ABNORMAL LOW (ref 6.4–8.1)

## 2014-02-15 LAB — CBC WITH DIFFERENTIAL (CANCER CENTER ONLY)
BASO#: 0 10*3/uL (ref 0.0–0.2)
BASO%: 1.1 % (ref 0.0–2.0)
EOS%: 3.4 % (ref 0.0–7.0)
Eosinophils Absolute: 0.1 10*3/uL (ref 0.0–0.5)
HEMATOCRIT: 35.1 % (ref 34.8–46.6)
HGB: 11.4 g/dL — ABNORMAL LOW (ref 11.6–15.9)
LYMPH#: 1.9 10*3/uL (ref 0.9–3.3)
LYMPH%: 53.2 % — AB (ref 14.0–48.0)
MCH: 34.5 pg — AB (ref 26.0–34.0)
MCHC: 32.5 g/dL (ref 32.0–36.0)
MCV: 106 fL — AB (ref 81–101)
MONO#: 0.8 10*3/uL (ref 0.1–0.9)
MONO%: 21 % — AB (ref 0.0–13.0)
NEUT#: 0.8 10*3/uL — ABNORMAL LOW (ref 1.5–6.5)
NEUT%: 21.3 % — AB (ref 39.6–80.0)
PLATELETS: 97 10*3/uL — AB (ref 145–400)
RBC: 3.3 10*6/uL — ABNORMAL LOW (ref 3.70–5.32)
RDW: 15.2 % (ref 11.1–15.7)
WBC: 3.6 10*3/uL — ABNORMAL LOW (ref 3.9–10.0)

## 2014-02-15 MED ORDER — ALTEPLASE 2 MG IJ SOLR
2.0000 mg | Freq: Once | INTRAMUSCULAR | Status: DC | PRN
  Filled 2014-02-15: qty 2

## 2014-02-15 MED ORDER — SODIUM CHLORIDE 0.9 % IV SOLN
375.0000 mg/m2 | Freq: Once | INTRAVENOUS | Status: AC
  Administered 2014-02-15: 600 mg via INTRAVENOUS
  Filled 2014-02-15: qty 60

## 2014-02-15 MED ORDER — SODIUM CHLORIDE 0.9 % IJ SOLN
10.0000 mL | INTRAMUSCULAR | Status: DC | PRN
  Filled 2014-02-15: qty 10

## 2014-02-15 MED ORDER — DEXTROSE 5 % IV SOLN
Freq: Once | INTRAVENOUS | Status: AC
  Administered 2014-02-15: 10:00:00 via INTRAVENOUS

## 2014-02-15 MED ORDER — DIPHENHYDRAMINE HCL 25 MG PO CAPS: 50.0000 mg | ORAL_CAPSULE | Freq: Once | ORAL | Status: DC

## 2014-02-15 MED ORDER — HEPARIN SOD (PORK) LOCK FLUSH 100 UNIT/ML IV SOLN
500.0000 [IU] | Freq: Once | INTRAVENOUS | Status: DC | PRN
  Filled 2014-02-15: qty 5

## 2014-02-15 MED ORDER — DEXAMETHASONE SODIUM PHOSPHATE 10 MG/ML IJ SOLN
10.0000 mg | Freq: Once | INTRAMUSCULAR | Status: AC
  Administered 2014-02-15: 10 mg via INTRAVENOUS

## 2014-02-15 MED ORDER — SODIUM CHLORIDE 0.9 % IV SOLN
Freq: Once | INTRAVENOUS | Status: AC
  Administered 2014-02-15: 13:00:00 via INTRAVENOUS

## 2014-02-15 MED ORDER — ACETAMINOPHEN 325 MG PO TABS: 650.0000 mg | ORAL_TABLET | Freq: Once | ORAL | Status: DC

## 2014-02-15 MED ORDER — SODIUM CHLORIDE 0.9 % IV SOLN
600.0000 mg/m2 | Freq: Once | INTRAVENOUS | Status: AC
  Administered 2014-02-15: 950 mg via INTRAVENOUS
  Filled 2014-02-15: qty 24.98

## 2014-02-15 MED ORDER — ONDANSETRON 8 MG/50ML IVPB (CHCC)
8.0000 mg | Freq: Once | INTRAVENOUS | Status: AC
  Administered 2014-02-15: 8 mg via INTRAVENOUS

## 2014-02-15 MED ORDER — HEPARIN SOD (PORK) LOCK FLUSH 100 UNIT/ML IV SOLN
250.0000 [IU] | Freq: Once | INTRAVENOUS | Status: DC | PRN
  Filled 2014-02-15: qty 5

## 2014-02-15 MED ORDER — OXALIPLATIN CHEMO INJECTION 100 MG/20ML
50.0000 mg/m2 | Freq: Once | INTRAVENOUS | Status: AC
  Administered 2014-02-15: 80 mg via INTRAVENOUS
  Filled 2014-02-15: qty 16

## 2014-02-15 MED ORDER — SODIUM CHLORIDE 0.9 % IJ SOLN
3.0000 mL | INTRAMUSCULAR | Status: DC | PRN
  Filled 2014-02-15: qty 10

## 2014-02-15 MED ORDER — SODIUM CHLORIDE 0.9 % IV SOLN: Freq: Once | INTRAVENOUS | Status: AC

## 2014-02-15 MED ORDER — DEXAMETHASONE SODIUM PHOSPHATE 10 MG/ML IJ SOLN
INTRAMUSCULAR | Status: AC
  Filled 2014-02-15: qty 1

## 2014-02-15 NOTE — Progress Notes (Signed)
Hematology and Oncology Follow Up Visit  [name] [id number] 1940-11-21 73 y.o. 02/15/2014   Principle Diagnosis:   Recurrent diffuse large cell non-Hodgkin's lymphoma  Chronic renal failure on hemodialysis  Paroxysmal atrial fibrillation  Current Therapy:   Status post 7 cycles of chemotherapy with R-gemcitabine/oxaliplatin     Interim History:  [name] is back for followup. She is doing fairly well right now. She is eating a little better. Her dialysis is going pretty well. She has her dialysis Tuesday Thursday and Saturday.  She's had no problems with pain. There is no nausea vomiting. She's had no change in bowel or bladder habits. She is still making some urine. She's had no leg swelling. There's been no rashes. She has the fistula in her left upper arm. She has an ecchymoses on the left arm.  She's had no mouth sores. She's had no headache. She's had no positive diarrhea.  Overall, her performance status is ECOG 1-2   Medications: Current outpatient prescriptions:amiodarone (PACERONE) 200 MG tablet, Take 200 mg by mouth daily., Disp: , Rfl: ;  dexamethasone (DECADRON) 4 MG tablet, Take 2 tabs daily with a meal for 3 days starting the day after chemo., Disp: 24 tablet, Rfl: 2;  docusate sodium 100 MG CAPS, Take 200 mg by mouth 3 (three) times daily as needed for constipation., Disp: 60 capsule, Rfl: 2 esomeprazole (NEXIUM) 40 MG capsule, Take 40 mg by mouth daily. , Disp: , Rfl: ;  famciclovir (FAMVIR) 125 MG tablet, Take 1 tablet on T-Th-Sat AFTER dialysis, Disp: 30 tablet, Rfl: 2;  fentaNYL (DURAGESIC - DOSED MCG/HR) 12 MCG/HR, Place 1 patch (12.5 mcg total) onto the skin every 3 (three) days., Disp: 10 patch, Rfl: 0;  lidocaine-prilocaine (EMLA) cream, Apply 1 application topically as needed (for port-a-cath access)., Disp: , Rfl:  loratadine (CLARITIN) 10 MG tablet, Take 10 mg by mouth daily as needed for allergies. , Disp: , Rfl: ;  metoprolol tartrate (LOPRESSOR)  25 MG tablet, Take 1 tablet (25 mg total) by mouth 2 (two) times daily., Disp: 60 tablet, Rfl: 0;  ondansetron (ZOFRAN-ODT) 8 MG disintegrating tablet, DISSOLVE ONE TABLET BY MOUTH EVERY 8 HOURS AS NEEDED FOR NAUSEA., Disp: 20 tablet, Rfl: 0 RENVELA 800 MG tablet, Take 800 mg by mouth 3 (three) times daily with meals. , Disp: , Rfl: ;  traMADol (ULTRAM) 50 MG tablet, Take 50 mg by mouth every 6 (six) hours as needed for moderate pain., Disp: , Rfl:  No current facility-administered medications for this visit. Facility-Administered Medications Ordered in Other Visits: 0.9 %  sodium chloride infusion, , Intravenous, Once, Volanda Napoleon, MD;  0.9 %  sodium chloride infusion, , Intravenous, Once, Volanda Napoleon, MD;  acetaminophen (TYLENOL) tablet 650 mg, 650 mg, Oral, Once, Volanda Napoleon, MD;  alteplase (CATHFLO ACTIVASE) injection 2 mg, 2 mg, Intracatheter, Once PRN, Volanda Napoleon, MD diphenhydrAMINE (BENADRYL) capsule 50 mg, 50 mg, Oral, Once, Volanda Napoleon, MD;  heparin lock flush 100 unit/mL, 500 Units, Intracatheter, Once PRN, Volanda Napoleon, MD;  heparin lock flush 100 unit/mL, 250 Units, Intracatheter, Once PRN, Volanda Napoleon, MD;  sodium chloride 0.9 % injection 10 mL, 10 mL, Intracatheter, PRN, Volanda Napoleon, MD;  sodium chloride 0.9 % injection 3 mL, 3 mL, Intravenous, PRN, Volanda Napoleon, MD  Allergies:  Allergies  Allergen Reactions  . Advicor [Niacin-Lovastatin Er]     Muscle cramps  . Crestor [Rosuvastatin]     Muscle cramps  .  Lipitor [Atorvastatin]     Muscle cramps  . Lovastatin     Muscle cramps  . Morphine And Related Other (See Comments)    "made me crazy"  . Niaspan [Niacin Er]     Muscle cramps  . Nsaids     Gi side effects  . Pravachol [Pravastatin Sodium]     Gi side effects  . Sulfa Antibiotics Other (See Comments)    Can't remember   . Vytorin [Ezetimibe-Simvastatin] Nausea Only  . Welchol [Colesevelam Hcl]     Muscle cramps  . Zetia [Ezetimibe]      Muscle cramps   . Zocor [Simvastatin]     Muscle cramps     Past Medical History, Surgical history, Social history, and Family History were reviewed and updated.  Review of Systems: As above  Physical Exam:  height is 5\' 5"  (1.651 m) and weight is 126 lb (57.153 kg). Her temperature is 97.9 F (36.6 C). Her blood pressure is 174/60 and her pulse is 52. Her respiration is 14.   [name], elderly white female. Head exam shows no adenopathy. No oral lesions. No scleral icterus. Lungs are clear. Cardiac exam regular in rhythm with no murmurs rubs or bruits. Abdomen is soft. She has good bowel sounds. There is no fluid wave. There is no palpable liver or spleen. Back exam no tenderness over the spine ribs or hips. Extremities shows no clubbing cyanosis or edema. She has 4+/5 strength in her legs. She has good range of motion of her joints. Neurological exam shows no focal neurological deficits. Skin exam shows ecchymoses on the left upper arm and to some degree on the left forearm.  Lab Results  Component Value Date   WBC 3.6* 02/15/2014   HGB 11.4* 02/15/2014   HCT 35.1 02/15/2014   MCV 106* 02/15/2014   PLT 97* 02/15/2014     Chemistry      Component Value Date/Time   NA 140 02/15/2014 0822   NA 139 12/20/2013 0330   K 5.0* 02/15/2014 0822   K 4.3 12/20/2013 0330   CL 95* 02/15/2014 0822   CL 97 12/20/2013 0330   CO2 32 02/15/2014 0822   CO2 31 12/20/2013 0330   BUN 31* 02/15/2014 0822   BUN 21 12/20/2013 0330   CREATININE 6.3* 02/15/2014 0822   CREATININE 3.82* 12/20/2013 0330      Component Value Date/Time   CALCIUM 9.2 02/15/2014 0822   CALCIUM 8.4 12/20/2013 0330   ALKPHOS 66 02/15/2014 0822   ALKPHOS 62 11/04/2013 0540   AST 19 02/15/2014 0822   AST 20 11/04/2013 0540   ALT 12 02/15/2014 0822   ALT 9 11/04/2013 0540   BILITOT 0.40 02/15/2014 0822   BILITOT 0.3 11/04/2013 0540         Impression and Plan: [name] is 73 year old white female with recurrent non-Hodgkin's lymphoma. This  is her third recurrence. We actually have been in fairly well with our current regimen.  I think that her blood counts are showing that she is beginning to have some difficulty tolerating the protocol. I have reduced her dose is a little bit. We still have some delays. Regular treatment every 3 weeks.  I forgot to mention that she does get Neulasta.  For now, I will go ahead and get her set up another PET scan. Her last one was back in January.  If the are PET scan looks good, then I will try to give her a break from treatment. Thyroid  think this would help her in continue to have her maintain a decent quality of life.  I will plan to see her back in 3 weeks myself.  Spent a good half are with Ms. Tonner and her family. They're very nice. She really has done a nice job and her family has been with her and has provided a lot of help.   Volanda Napoleon, MD 4/20/20151:57 PM

## 2014-02-15 NOTE — Patient Instructions (Signed)
Oxaliplatin Injection What is this medicine? OXALIPLATIN (ox AL i PLA tin) is a chemotherapy drug. It targets fast dividing cells, like cancer cells, and causes these cells to die. This medicine is used to treat cancers of the colon and rectum, and many other cancers. This medicine may be used for other purposes; ask your health care provider or pharmacist if you have questions. COMMON BRAND NAME(S): Eloxatin What should I tell my health care provider before I take this medicine? They need to know if you have any of these conditions: -kidney disease -an unusual or allergic reaction to oxaliplatin, other chemotherapy, other medicines, foods, dyes, or preservatives -pregnant or trying to get pregnant -breast-feeding How should I use this medicine? This drug is given as an infusion into a vein. It is administered in a hospital or clinic by a specially trained health care professional. Talk to your pediatrician regarding the use of this medicine in children. Special care may be needed. Overdosage: If you think you have taken too much of this medicine contact a poison control center or emergency room at once. NOTE: This medicine is only for you. Do not share this medicine with others. What if I miss a dose? It is important not to miss a dose. Call your doctor or health care professional if you are unable to keep an appointment. What may interact with this medicine? -medicines to increase blood counts like filgrastim, pegfilgrastim, sargramostim -probenecid -some antibiotics like amikacin, gentamicin, neomycin, polymyxin B, streptomycin, tobramycin -zalcitabine Talk to your doctor or health care professional before taking any of these medicines: -acetaminophen -aspirin -ibuprofen -ketoprofen -naproxen This list may not describe all possible interactions. Give your health care provider a list of all the medicines, herbs, non-prescription drugs, or dietary supplements you use. Also tell them if  you smoke, drink alcohol, or use illegal drugs. Some items may interact with your medicine. What should I watch for while using this medicine? Your condition will be monitored carefully while you are receiving this medicine. You will need important blood work done while you are taking this medicine. This medicine can make you more sensitive to cold. Do not drink cold drinks or use ice. Cover exposed skin before coming in contact with cold temperatures or cold objects. When out in cold weather wear warm clothing and cover your mouth and nose to warm the air that goes into your lungs. Tell your doctor if you get sensitive to the cold. This drug may make you feel generally unwell. This is not uncommon, as chemotherapy can affect healthy cells as well as cancer cells. Report any side effects. Continue your course of treatment even though you feel ill unless your doctor tells you to stop. In some cases, you may be given additional medicines to help with side effects. Follow all directions for their use. Call your doctor or health care professional for advice if you get a fever, chills or sore throat, or other symptoms of a cold or flu. Do not treat yourself. This drug decreases your body's ability to fight infections. Try to avoid being around people who are sick. This medicine may increase your risk to bruise or bleed. Call your doctor or health care professional if you notice any unusual bleeding. Be careful brushing and flossing your teeth or using a toothpick because you may get an infection or bleed more easily. If you have any dental work done, tell your dentist you are receiving this medicine. Avoid taking products that contain aspirin, acetaminophen, ibuprofen, naproxen,   or ketoprofen unless instructed by your doctor. These medicines may hide a fever. Do not become pregnant while taking this medicine. Women should inform their doctor if they wish to become pregnant or think they might be pregnant. There  is a potential for serious side effects to an unborn child. Talk to your health care professional or pharmacist for more information. Do not breast-feed an infant while taking this medicine. Call your doctor or health care professional if you get diarrhea. Do not treat yourself. What side effects may I notice from receiving this medicine? Side effects that you should report to your doctor or health care professional as soon as possible: -allergic reactions like skin rash, itching or hives, swelling of the face, lips, or tongue -low blood counts - This drug may decrease the number of white blood cells, red blood cells and platelets. You may be at increased risk for infections and bleeding. -signs of infection - fever or chills, cough, sore throat, pain or difficulty passing urine -signs of decreased platelets or bleeding - bruising, pinpoint red spots on the skin, black, tarry stools, nosebleeds -signs of decreased red blood cells - unusually weak or tired, fainting spells, lightheadedness -breathing problems -chest pain, pressure -cough -diarrhea -jaw tightness -mouth sores -nausea and vomiting -pain, swelling, redness or irritation at the injection site -pain, tingling, numbness in the hands or feet -problems with balance, talking, walking -redness, blistering, peeling or loosening of the skin, including inside the mouth -trouble passing urine or change in the amount of urine Side effects that usually do not require medical attention (report to your doctor or health care professional if they continue or are bothersome): -changes in vision -constipation -hair loss -loss of appetite -metallic taste in the mouth or changes in taste -stomach pain This list may not describe all possible side effects. Call your doctor for medical advice about side effects. You may report side effects to FDA at 1-800-FDA-1088. Where should I keep my medicine? This drug is given in a hospital or clinic and  will not be stored at home. NOTE: This sheet is a summary. It may not cover all possible information. If you have questions about this medicine, talk to your doctor, pharmacist, or health care provider.  2014, Elsevier/Gold Standard. (2008-05-11 17:22:47) Rituximab injection What is this medicine? RITUXIMAB (ri TUX i mab) is a monoclonal antibody. This medicine changes the way the body's immune system works. It is used commonly to treat non-Hodgkin's lymphoma and other conditions. In cancer cells, this drug targets a specific protein within cancer cells and stops the cancer cells from growing. It is also used to treat rhuematoid arthritis (RA). In RA, this medicine slow the inflammatory process and help reduce joint pain and swelling. This medicine is often used with other cancer or arthritis medications. This medicine may be used for other purposes; ask your health care provider or pharmacist if you have questions. COMMON BRAND NAME(S): Rituxan What should I tell my health care provider before I take this medicine? They need to know if you have any of these conditions: -blood disorders -heart disease -history of hepatitis B -infection (especially a virus infection such as chickenpox, cold sores, or herpes) -irregular heartbeat -kidney disease -lung or breathing disease, like asthma -lupus -an unusual or allergic reaction to rituximab, mouse proteins, other medicines, foods, dyes, or preservatives -pregnant or trying to get pregnant -breast-feeding How should I use this medicine? This medicine is for infusion into a vein. It is administered in a hospital  or clinic by a specially trained health care professional. A special MedGuide will be given to you by the pharmacist with each prescription and refill. Be sure to read this information carefully each time. Talk to your pediatrician regarding the use of this medicine in children. This medicine is not approved for use in children. Overdosage:  If you think you have taken too much of this medicine contact a poison control center or emergency room at once. NOTE: This medicine is only for you. Do not share this medicine with others. What if I miss a dose? It is important not to miss a dose. Call your doctor or health care professional if you are unable to keep an appointment. What may interact with this medicine? -cisplatin -medicines for blood pressure -some other medicines for arthritis -vaccines This list may not describe all possible interactions. Give your health care provider a list of all the medicines, herbs, non-prescription drugs, or dietary supplements you use. Also tell them if you smoke, drink alcohol, or use illegal drugs. Some items may interact with your medicine. What should I watch for while using this medicine? Report any side effects that you notice during your treatment right away, such as changes in your breathing, fever, chills, dizziness or lightheadedness. These effects are more common with the first dose. Visit your prescriber or health care professional for checks on your progress. You will need to have regular blood work. Report any other side effects. The side effects of this medicine can continue after you finish your treatment. Continue your course of treatment even though you feel ill unless your doctor tells you to stop. Call your doctor or health care professional for advice if you get a fever, chills or sore throat, or other symptoms of a cold or flu. Do not treat yourself. This drug decreases your body's ability to fight infections. Try to avoid being around people who are sick. This medicine may increase your risk to bruise or bleed. Call your doctor or health care professional if you notice any unusual bleeding. Be careful brushing and flossing your teeth or using a toothpick because you may get an infection or bleed more easily. If you have any dental work done, tell your dentist you are receiving this  medicine. Avoid taking products that contain aspirin, acetaminophen, ibuprofen, naproxen, or ketoprofen unless instructed by your doctor. These medicines may hide a fever. Do not become pregnant while taking this medicine. Women should inform their doctor if they wish to become pregnant or think they might be pregnant. There is a potential for serious side effects to an unborn child. Talk to your health care professional or pharmacist for more information. Do not breast-feed an infant while taking this medicine. What side effects may I notice from receiving this medicine? Side effects that you should report to your doctor or health care professional as soon as possible: -allergic reactions like skin rash, itching or hives, swelling of the face, lips, or tongue -low blood counts - this medicine may decrease the number of white blood cells, red blood cells and platelets. You may be at increased risk for infections and bleeding. -signs of infection - fever or chills, cough, sore throat, pain or difficulty passing urine -signs of decreased platelets or bleeding - bruising, pinpoint red spots on the skin, black, tarry stools, blood in the urine -signs of decreased red blood cells - unusually weak or tired, fainting spells, lightheadedness -breathing problems -confused, not responsive -chest pain -fast, irregular heartbeat -feeling faint  or lightheaded, falls -mouth sores -redness, blistering, peeling or loosening of the skin, including inside the mouth -stomach pain -swelling of the ankles, feet, or hands -trouble passing urine or change in the amount of urine Side effects that usually do not require medical attention (report to your doctor or other health care professional if they continue or are bothersome): -anxiety -headache -loss of appetite -muscle aches -nausea -night sweats This list may not describe all possible side effects. Call your doctor for medical advice about side effects. You  may report side effects to FDA at 1-800-FDA-1088. Where should I keep my medicine? This drug is given in a hospital or clinic and will not be stored at home. NOTE: This sheet is a summary. It may not cover all possible information. If you have questions about this medicine, talk to your doctor, pharmacist, or health care provider.  2014, [employer]/Gold Standard. (2008-06-14 14:04:59) Gemcitabine injection What is this medicine? GEMCITABINE (jem SIT a been) is a chemotherapy drug. This medicine is used to treat many types of cancer like breast cancer, lung cancer, pancreatic cancer, and ovarian cancer. This medicine may be used for other purposes; ask your health care provider or pharmacist if you have questions. COMMON BRAND NAME(S): Gemzar What should I tell my health care provider before I take this medicine? They need to know if you have any of these conditions: -blood disorders -infection -kidney disease -liver disease -recent or ongoing radiation therapy -an unusual or allergic reaction to gemcitabine, other chemotherapy, other medicines, foods, dyes, or preservatives -pregnant or trying to get pregnant -breast-feeding How should I use this medicine? This drug is given as an infusion into a vein. It is administered in a hospital or clinic by a specially trained health care professional. Talk to your pediatrician regarding the use of this medicine in children. Special care may be needed. Overdosage: If you think you have taken too much of this medicine contact a poison control center or emergency room at once. NOTE: This medicine is only for you. Do not share this medicine with others. What if I miss a dose? It is important not to miss your dose. Call your doctor or health care professional if you are unable to keep an appointment. What may interact with this medicine? -medicines to increase blood counts like filgrastim, pegfilgrastim, sargramostim -some other chemotherapy drugs like  cisplatin -vaccines Talk to your doctor or health care professional before taking any of these medicines: -acetaminophen -aspirin -ibuprofen -ketoprofen -naproxen This list may not describe all possible interactions. Give your health care provider a list of all the medicines, herbs, non-prescription drugs, or dietary supplements you use. Also tell them if you smoke, drink alcohol, or use illegal drugs. Some items may interact with your medicine. What should I watch for while using this medicine? Visit your doctor for checks on your progress. This drug may make you feel generally unwell. This is not uncommon, as chemotherapy can affect healthy cells as well as cancer cells. Report any side effects. Continue your course of treatment even though you feel ill unless your doctor tells you to stop. In some cases, you may be given additional medicines to help with side effects. Follow all directions for their use. Call your doctor or health care professional for advice if you get a fever, chills or sore throat, or other symptoms of a cold or flu. Do not treat yourself. This drug decreases your body's ability to fight infections. Try to avoid being around people who are  sick. This medicine may increase your risk to bruise or bleed. Call your doctor or health care professional if you notice any unusual bleeding. Be careful brushing and flossing your teeth or using a toothpick because you may get an infection or bleed more easily. If you have any dental work done, tell your dentist you are receiving this medicine. Avoid taking products that contain aspirin, acetaminophen, ibuprofen, naproxen, or ketoprofen unless instructed by your doctor. These medicines may hide a fever. Women should inform their doctor if they wish to become pregnant or think they might be pregnant. There is a potential for serious side effects to an unborn child. Talk to your health care professional or pharmacist for more information. Do  not breast-feed an infant while taking this medicine. What side effects may I notice from receiving this medicine? Side effects that you should report to your doctor or health care professional as soon as possible: -allergic reactions like skin rash, itching or hives, swelling of the face, lips, or tongue -low blood counts - this medicine may decrease the number of white blood cells, red blood cells and platelets. You may be at increased risk for infections and bleeding. -signs of infection - fever or chills, cough, sore throat, pain or difficulty passing urine -signs of decreased platelets or bleeding - bruising, pinpoint red spots on the skin, black, tarry stools, blood in the urine -signs of decreased red blood cells - unusually weak or tired, fainting spells, lightheadedness -breathing problems -chest pain -mouth sores -nausea and vomiting -pain, swelling, redness at site where injected -pain, tingling, numbness in the hands or feet -stomach pain -swelling of ankles, feet, hands -unusual bleeding Side effects that usually do not require medical attention (report to your doctor or health care professional if they continue or are bothersome): -constipation -diarrhea -hair loss -loss of appetite -stomach upset This list may not describe all possible side effects. Call your doctor for medical advice about side effects. You may report side effects to FDA at 1-800-FDA-1088. Where should I keep my medicine? This drug is given in a hospital or clinic and will not be stored at home. NOTE: This sheet is a summary. It may not cover all possible information. If you have questions about this medicine, talk to your doctor, pharmacist, or health care provider.  2014, [employer]vier/Gold Standard. (2008-02-24 18:45:54)

## 2014-02-16 ENCOUNTER — Ambulatory Visit: Payer: Medicare Other

## 2014-02-17 ENCOUNTER — Ambulatory Visit (HOSPITAL_BASED_OUTPATIENT_CLINIC_OR_DEPARTMENT_OTHER): Payer: Medicare Other

## 2014-02-17 VITALS — BP 152/60 | HR 54 | Temp 98.4°F

## 2014-02-17 DIAGNOSIS — C8330 Diffuse large B-cell lymphoma, unspecified site: Secondary | ICD-10-CM

## 2014-02-17 DIAGNOSIS — N189 Chronic kidney disease, unspecified: Principal | ICD-10-CM

## 2014-02-17 DIAGNOSIS — D631 Anemia in chronic kidney disease: Secondary | ICD-10-CM

## 2014-02-17 DIAGNOSIS — Z5189 Encounter for other specified aftercare: Secondary | ICD-10-CM

## 2014-02-17 DIAGNOSIS — C8589 Other specified types of non-Hodgkin lymphoma, extranodal and solid organ sites: Secondary | ICD-10-CM

## 2014-02-17 MED ORDER — PEGFILGRASTIM INJECTION 6 MG/0.6ML
6.0000 mg | Freq: Once | SUBCUTANEOUS | Status: AC
  Administered 2014-02-17: 6 mg via SUBCUTANEOUS
  Filled 2014-02-17: qty 0.6

## 2014-02-23 ENCOUNTER — Other Ambulatory Visit: Payer: Self-pay | Admitting: Hematology & Oncology

## 2014-03-01 ENCOUNTER — Other Ambulatory Visit: Payer: Medicare Other | Admitting: Lab

## 2014-03-01 ENCOUNTER — Ambulatory Visit: Payer: Medicare Other

## 2014-03-01 ENCOUNTER — Ambulatory Visit: Payer: Medicare Other | Admitting: Hematology & Oncology

## 2014-03-03 ENCOUNTER — Encounter (HOSPITAL_COMMUNITY): Payer: Self-pay

## 2014-03-03 ENCOUNTER — Ambulatory Visit (HOSPITAL_COMMUNITY)
Admission: RE | Admit: 2014-03-03 | Discharge: 2014-03-03 | Disposition: A | Discharge status: Home / Self Care | Payer: Medicare Other | Source: Ambulatory Visit | Attending: Hematology & Oncology | Admitting: Hematology & Oncology

## 2014-03-03 DIAGNOSIS — Z9221 Personal history of antineoplastic chemotherapy: Secondary | ICD-10-CM | POA: Insufficient documentation

## 2014-03-03 DIAGNOSIS — K8020 Calculus of gallbladder without cholecystitis without obstruction: Secondary | ICD-10-CM | POA: Insufficient documentation

## 2014-03-03 DIAGNOSIS — Z90710 Acquired absence of both cervix and uterus: Secondary | ICD-10-CM | POA: Insufficient documentation

## 2014-03-03 DIAGNOSIS — D739 Disease of spleen, unspecified: Secondary | ICD-10-CM | POA: Insufficient documentation

## 2014-03-03 DIAGNOSIS — I2510 Atherosclerotic heart disease of native coronary artery without angina pectoris: Secondary | ICD-10-CM | POA: Insufficient documentation

## 2014-03-03 DIAGNOSIS — I517 Cardiomegaly: Secondary | ICD-10-CM | POA: Insufficient documentation

## 2014-03-03 DIAGNOSIS — C8330 Diffuse large B-cell lymphoma, unspecified site: Secondary | ICD-10-CM

## 2014-03-03 DIAGNOSIS — C8589 Other specified types of non-Hodgkin lymphoma, extranodal and solid organ sites: Secondary | ICD-10-CM | POA: Insufficient documentation

## 2014-03-03 DIAGNOSIS — N289 Disorder of kidney and ureter, unspecified: Secondary | ICD-10-CM | POA: Insufficient documentation

## 2014-03-03 DIAGNOSIS — N269 Renal sclerosis, unspecified: Secondary | ICD-10-CM | POA: Insufficient documentation

## 2014-03-03 LAB — GLUCOSE, CAPILLARY: Glucose-Capillary: 92 mg/dL (ref 70–99)

## 2014-03-03 MED ORDER — FLUDEOXYGLUCOSE F - 18 (FDG) INJECTION
7.9000 | Freq: Once | INTRAVENOUS | Status: AC | PRN
  Administered 2014-03-03: 7.9 via INTRAVENOUS

## 2014-03-08 ENCOUNTER — Ambulatory Visit: Payer: Medicare Other

## 2014-03-08 ENCOUNTER — Ambulatory Visit (HOSPITAL_BASED_OUTPATIENT_CLINIC_OR_DEPARTMENT_OTHER): Payer: Medicare Other | Admitting: Hematology & Oncology

## 2014-03-08 ENCOUNTER — Other Ambulatory Visit (HOSPITAL_BASED_OUTPATIENT_CLINIC_OR_DEPARTMENT_OTHER): Payer: Medicare Other | Admitting: Lab

## 2014-03-08 VITALS — BP 143/58 | HR 52 | Temp 98.0°F | Resp 16 | Ht 65.0 in | Wt 124.0 lb

## 2014-03-08 DIAGNOSIS — C8330 Diffuse large B-cell lymphoma, unspecified site: Secondary | ICD-10-CM

## 2014-03-08 DIAGNOSIS — N186 End stage renal disease: Secondary | ICD-10-CM

## 2014-03-08 DIAGNOSIS — C8589 Other specified types of non-Hodgkin lymphoma, extranodal and solid organ sites: Secondary | ICD-10-CM

## 2014-03-08 LAB — CBC WITH DIFFERENTIAL (CANCER CENTER ONLY)
BASO#: 0.1 10*3/uL (ref 0.0–0.2)
BASO%: 1.2 % (ref 0.0–2.0)
EOS%: 1.1 % (ref 0.0–7.0)
Eosinophils Absolute: 0.1 10*3/uL (ref 0.0–0.5)
HEMATOCRIT: 32.7 % — AB (ref 34.8–46.6)
HGB: 10.2 g/dL — ABNORMAL LOW (ref 11.6–15.9)
LYMPH#: 1.6 10*3/uL (ref 0.9–3.3)
LYMPH%: 18.4 % (ref 14.0–48.0)
MCH: 35.1 pg — AB (ref 26.0–34.0)
MCHC: 31.2 g/dL — AB (ref 32.0–36.0)
MCV: 112 fL — ABNORMAL HIGH (ref 81–101)
MONO#: 1 10*3/uL — ABNORMAL HIGH (ref 0.1–0.9)
MONO%: 11.5 % (ref 0.0–13.0)
NEUT%: 67.8 % (ref 39.6–80.0)
NEUTROS ABS: 5.7 10*3/uL (ref 1.5–6.5)
Platelets: 103 10*3/uL — ABNORMAL LOW (ref 145–400)
RBC: 2.91 10*6/uL — ABNORMAL LOW (ref 3.70–5.32)
RDW: 18.6 % — AB (ref 11.1–15.7)
WBC: 8.4 10*3/uL (ref 3.9–10.0)

## 2014-03-08 LAB — CMP (CANCER CENTER ONLY)
ALT: 10 U/L (ref 10–47)
AST: 26 U/L (ref 11–38)
Albumin: 3.4 g/dL (ref 3.3–5.5)
Alkaline Phosphatase: 95 U/L — ABNORMAL HIGH (ref 26–84)
BUN, Bld: 25 mg/dL — ABNORMAL HIGH (ref 7–22)
CALCIUM: 9.4 mg/dL (ref 8.0–10.3)
CHLORIDE: 95 meq/L — AB (ref 98–108)
CO2: 35 mEq/L — ABNORMAL HIGH (ref 18–33)
Creat: 5.9 mg/dl (ref 0.6–1.2)
Glucose, Bld: 122 mg/dL — ABNORMAL HIGH (ref 73–118)
Potassium: 3.8 mEq/L (ref 3.3–4.7)
Sodium: 140 mEq/L (ref 128–145)
Total Bilirubin: 0.4 mg/dl (ref 0.20–1.60)
Total Protein: 6.1 g/dL — ABNORMAL LOW (ref 6.4–8.1)

## 2014-03-08 MED ORDER — PROCHLORPERAZINE MALEATE 10 MG PO TABS: 5.0000 mg | ORAL_TABLET | Freq: Four times a day (QID) | ORAL | Status: DC | PRN

## 2014-03-08 NOTE — Progress Notes (Signed)
No treatment today per dr. ennever 

## 2014-03-08 NOTE — Progress Notes (Signed)
Hematology and Oncology Follow Up Visit  [name] [id number] 05-27-1941 73 y.o. 03/08/2014   Principle Diagnosis:   Recurrent diffuse large cell non-Hodgkin's lymphoma  Chronic renal failure on hemodialysis  Paroxysmal atrial fibrillation  Current Therapy:    8 Cycles of chemotherapy with Rituxan/gemcitabine/oxaliplatin     Interim History:  [name] is back for followup. She looks pretty good. She has gotten a little more fatigue from chemotherapy. She is also on dialysis 3 times a week. This also tends to fatigue her.  We did go ahead and do a PET scan are. This was done last week. The PET scan did not show any evidence of active lymphoma.  She's had no further heart issues. There's been no atrial fibrillation issues. She's had no bleeding.  Has been okay. She's had no nausea vomiting. Has been no leg swelling.  In her left arm where the AV fistula is, there is not as much bruising.  Medications: Current outpatient prescriptions:amiodarone (PACERONE) 200 MG tablet, Take 200 mg by mouth daily., Disp: , Rfl: ;  dexamethasone (DECADRON) 4 MG tablet, Take 2 tabs daily with a meal for 3 days starting the day after chemo., Disp: 24 tablet, Rfl: 2;  docusate sodium 100 MG CAPS, Take 200 mg by mouth 3 (three) times daily as needed for constipation., Disp: 60 capsule, Rfl: 2 esomeprazole (NEXIUM) 40 MG capsule, Take 40 mg by mouth daily. , Disp: , Rfl: ;  famciclovir (FAMVIR) 125 MG tablet, Take 1 tablet on T-Th-Sat AFTER dialysis, Disp: 30 tablet, Rfl: 2;  fentaNYL (DURAGESIC - DOSED MCG/HR) 12 MCG/HR, Place 1 patch (12.5 mcg total) onto the skin every 3 (three) days., Disp: 10 patch, Rfl: 0;  lidocaine-prilocaine (EMLA) cream, Apply 1 application topically as needed (for port-a-cath access)., Disp: , Rfl:  loratadine (CLARITIN) 10 MG tablet, Take 10 mg by mouth daily as needed for allergies. , Disp: , Rfl: ;  metoprolol tartrate (LOPRESSOR) 25 MG tablet, Take 1 tablet (25 mg  total) by mouth 2 (two) times daily., Disp: 60 tablet, Rfl: 0;  ondansetron (ZOFRAN-ODT) 8 MG disintegrating tablet, DISSOLVE ONE TABLET BY MOUTH EVERY 8 HOURS AS NEEDED FOR NAUSEA., Disp: 20 tablet, Rfl: 0 ondansetron (ZOFRAN-ODT) 8 MG disintegrating tablet, TAKE 1 TABLET BY MOUTH EVERY 8 HOURS AS NEEDED FOR NAUSEA, Disp: 20 tablet, Rfl: 0;  RENVELA 800 MG tablet, Take 800 mg by mouth 3 (three) times daily with meals. , Disp: , Rfl: ;  traMADol (ULTRAM) 50 MG tablet, Take 50 mg by mouth every 6 (six) hours as needed for moderate pain., Disp: , Rfl:  prochlorperazine (COMPAZINE) 10 MG tablet, Take 0.5-1 tablets (5-10 mg total) by mouth every 6 (six) hours as needed for nausea or vomiting., Disp: 60 tablet, Rfl: 3  Allergies:  Allergies  Allergen Reactions  . Advicor [Niacin-Lovastatin Er]     Muscle cramps  . Crestor [Rosuvastatin]     Muscle cramps  . Lipitor [Atorvastatin]     Muscle cramps  . Lovastatin     Muscle cramps  . Morphine And Related Other (See Comments)    "made me crazy"  . Niaspan [Niacin Er]     Muscle cramps  . Nsaids     Gi side effects  . Pravachol [Pravastatin Sodium]     Gi side effects  . Sulfa Antibiotics Other (See Comments)    Can't remember   . Vytorin [Ezetimibe-Simvastatin] Nausea Only  . Welchol [Colesevelam Hcl]     Muscle cramps  .  Zetia [Ezetimibe]     Muscle cramps   . Zocor [Simvastatin]     Muscle cramps     Past Medical History, Surgical history, Social history, and Family History were reviewed and updated.  Review of Systems: As above  Physical Exam:  height is 5\' 5"  (1.651 m) and weight is 124 lb (56.246 kg). Her oral temperature is 98 F (36.7 C). Her blood pressure is 143/58 and her pulse is 52. Her respiration is 16.   Elderly, petite white female. She is alert and oriented. Head and neck show no ocular or oral lesions. There is no adenopathy in in the neck. There is no adenopathy or swelling just anterior to the right ear. Lungs  are clear. Cardiac exam regular in rhythm with no murmurs rubs or bruits. Abdomen is soft. Has good bowel sounds. There is no fluid wave. There is no palpable liver or spleen tip. Back exam no tenderness over the spine ribs or hips. Admission shows the AV fistula in the left upper arm. Some slight swelling is noted. Oropharynx shows no swelling. She is age related arthritic issues. She has good strength in her arms and legs. Skin exam shows scattered ecchymoses. Neurological exam is nonfocal.  Lab Results  Component Value Date   WBC 8.4 03/08/2014   HGB 10.2* 03/08/2014   HCT 32.7* 03/08/2014   MCV 112* 03/08/2014   PLT 103* 03/08/2014     Chemistry      Component Value Date/Time   NA 140 03/08/2014 0835   NA 139 12/20/2013 0330   K 3.8 03/08/2014 0835   K 4.3 12/20/2013 0330   CL 95* 03/08/2014 0835   CL 97 12/20/2013 0330   CO2 35* 03/08/2014 0835   CO2 31 12/20/2013 0330   BUN 25* 03/08/2014 0835   BUN 21 12/20/2013 0330   CREATININE 5.9* 03/08/2014 0835   CREATININE 3.82* 12/20/2013 0330      Component Value Date/Time   CALCIUM 9.4 03/08/2014 0835   CALCIUM 8.4 12/20/2013 0330   ALKPHOS 95* 03/08/2014 0835   ALKPHOS 62 11/04/2013 0540   AST 26 03/08/2014 0835   AST 20 11/04/2013 0540   ALT 10 03/08/2014 0835   ALT 9 11/04/2013 0540   BILITOT 0.40 03/08/2014 0835   BILITOT 0.3 11/04/2013 0540         Impression and Plan: [name] is 73 year old white female with recurrent non-Hodgkin's lymphoma. This is her second recurrence. She cannot tolerate chemotherapy when she had her initial recurrence. However, she got into remission with it. We really to watch her for a while. She has other health issues. She has a chronic renal failure that has required dialysis.  On the so glad that the recent PET scan did not show any active lymphoma right now. I know that will come back. However, I really think that she needs a break from chemotherapy. We have been going pretty much nonstop for over 6 months. She  has a lot going on. I need to make sure that her quality of life is good. I thought that chemotherapy was starting  to impact her in a negative way..  We will go ahead and plan to get her back now in 6 weeks. I think this would be very reasonable.  We do not need any scans on her right now.  Her blood counts are gradually improving which is nice to see.   Volanda Napoleon, MD 5/11/201511:15 AM

## 2014-03-09 ENCOUNTER — Ambulatory Visit: Payer: Medicare Other

## 2014-03-10 ENCOUNTER — Ambulatory Visit: Payer: Medicare Other

## 2014-03-29 ENCOUNTER — Ambulatory Visit: Payer: Medicare Other | Admitting: Hematology & Oncology

## 2014-03-29 ENCOUNTER — Other Ambulatory Visit: Payer: Medicare Other | Admitting: Lab

## 2014-03-29 ENCOUNTER — Ambulatory Visit: Payer: Medicare Other

## 2014-04-16 ENCOUNTER — Other Ambulatory Visit: Payer: Self-pay | Admitting: *Deleted

## 2014-04-16 ENCOUNTER — Encounter: Payer: Self-pay | Admitting: Cardiology

## 2014-04-16 DIAGNOSIS — C8330 Diffuse large B-cell lymphoma, unspecified site: Secondary | ICD-10-CM

## 2014-04-16 NOTE — Progress Notes (Signed)
Patient ID: TIMISHA MONDRY, female   DOB: [birth year], 73 y.o.   MRN: [id number]   Eldena, Dede  Date of visit:  04/16/2014 DOB:  08-16-1941    Age:  73 yrs. Medical record number:  [id number]     Account number:  [id number] Primary Care Provider: Darcus Austin ____________________________ CURRENT DIAGNOSES  1. Arrhythmia-Atrial Fibrillation  2. Amiodarone Treatment  3. Hypertension,Essential (Benign)  4. CAD,Native  5. Diabetes Mellitus-NIDD  6. GERD  7. Hyperlipidemia  8. End Stage Renal Disease  9. Personal History Of Other Lymphatic And Hematopoietic Neoplasms ____________________________ ALLERGIES  Bactrim, Intolerance-unknown  Morphine, Intolerance-unknown ____________________________ MEDICATIONS  1. Nexium 40 mg Capsule, Delayed Release(E.C.), 1 p.o. daily  2. ondansetron HCl 8 mg tablet, PRN  3. dexamethasone 4 mg tablet, Take as directed  4. Rena-Vite 0.8 mg tablet, 1 p.o. daily  5. Dulcolax (bisacodyl) 5 mg tablet,delayed release, 1 p.o. daily  6. famciclovir 250 mg tablet, Take as directed  7. alprazolam 0.5 mg tablet, PRN  8. oxycodone-acetaminophen 5 mg-325 mg tablet, PRN  9. fentanyl 12 mcg/hr transdermal patch, q 3 days  10. metoprolol tartrate 50 mg tablet, 1/2 tab b.i.d.  11. amiodarone 200 mg tablet, 1 p.o. daily  12. Crestor 10 mg tablet, 1 p.o. daily ____________________________ CHIEF COMPLAINTS  Followup of Arrhythmia-Atrial Fibrillation ____________________________ HISTORY OF PRESENT ILLNESS  Patient seen for followup of atrial fibrillation and amiodarone treatment. She is tolerating dialysis well and actually feels better with more energy since she is in sinus rhythm. She hasn't had any clinical recurrence of atrial fibrillation. She denies angina and has no PND, orthopnea or edema. She has occasional episodes where she will become dizzy and unsteady on her feet. She is not really a good candidate for anticoagulation reports that she is not currently  receiving treatment for lymphoma. Her lipids were checked today and she is currently above goal. ____________________________ PAST HISTORY  Past Medical Illnesses:  hypertension, DM-non-insulin dependent, hyperlipidemia, obesity, GERD, anxiety, lymphoma-non-Hodgkins, ESRD on hemodialysis;  Cardiovascular Illnesses:  atrial fibrillation, CAD;  Surgical Procedures:  appendectomy, hysterectomy, oophorectomy, lymph node bx, port-a-cath, shoulder surgery-rt;  Cardiology Procedures-Invasive:  cardiac cath (left) June 2012;  Cardiology Procedures-Noninvasive:  echocardiogram January 2015;  Cardiac Cath Results:  normal Left main, 20 % stenosis proximal LAD, 40% stenosis mid CFX, scattered irregularities RCA;  LVEF of 65% documented via echocardiogram on 11/04/2013,   ____________________________ CARDIO-PULMONARY TEST DATES EKG Date:  04/16/2014;   Cardiac Cath Date:  04/27/2011;  Echocardiography Date: 11/04/2013;  Chest Xray Date: 05/01/2011;   ____________________________ FAMILY HISTORY Brother -- Brother dead, Non-Hodgkins lymphoma Father -- Father dead, Myocardial infarction, Diabetes mellitus Mother -- Mother dead, Coronary Artery Disease Sister -- Sister dead, Cancer Sister -- Sister alive with problem, Dementia/Alzheimers ____________________________ SOCIAL HISTORY Alcohol Use:  no alcohol use;  Smoking:  used to smoke but quit 1983 Prior to 1980;  Diet:  regular diet;  Lifestyle:  married;  Exercise:  no regular exercise;  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 Cardiovascular:  please review HPI Abdominal: constipation Musculoskeletal:  arthritis of the right shoulder Neurological:  memory deficit, dizziness   ____________________________ PHYSICAL EXAMINATION VITAL SIGNS  Blood Pressure:  124/60 Sitting, Right arm, regular cuff  , 120/60 Standing, Right arm and regular cuff   Pulse:  58/min.  Weight:  123.00 lbs. Height:  62"BMI: 22  Constitutional:  pleasant white female, in no acute distress Skin:  multiple ecchymosis present Head:  normocephalic, normal hair pattern, no masses or tenderness Neck:  supple, without massess. No JVD, thyromegaly or carotid bruits. Carotid upstroke normal. Chest:  normal symmetry, clear to auscultation Cardiac:  regular rhythm, normal S1 and S2, No S3 or S4, no murmurs, gallops or rubs detected. Peripheral Pulses:  pulses full and equal in all extremities Extremities & Back:  limitation of range of motion shoulder, no edema present, dialysis fistula left arm Neurological:  no gross motor or sensory deficits noted, affect appropriate, oriented x3. ____________________________ MOST RECENT LIPID PANEL 04/16/14  CHOL TOTL 220 mg/dl, LDL 144 NM, HDL 47 mg/dl, TRIGLYCER 142 mg/dl and CHOL/HDL 4.7 (Calc) ____________________________ IMPRESSIONS/PLAN  1. Paroxysmal atrial fibrillation 2. Long-term treatment with amiodarone  3. Hyperlipidemia above goal 4. Coronary artery disease  Recommendations:  Begin Crestor 10 mg daily for treatment of elevated LDL in the presence of coronary artery disease. Continue amiodarone. Would request that Dr. Marin Olp draw a CMET and  TSH when he gets her blood in his office. ____________________________ TODAYS ORDERS  1. 12 Lead EKG: Today  2. Return Visit: 3 months                       ____________________________ Cardiology Physician:  Kerry Hough MD [facility]

## 2014-04-19 ENCOUNTER — Encounter: Payer: Self-pay | Admitting: Hematology & Oncology

## 2014-04-19 ENCOUNTER — Other Ambulatory Visit (HOSPITAL_BASED_OUTPATIENT_CLINIC_OR_DEPARTMENT_OTHER): Payer: Medicare Other | Admitting: Lab

## 2014-04-19 ENCOUNTER — Ambulatory Visit (HOSPITAL_BASED_OUTPATIENT_CLINIC_OR_DEPARTMENT_OTHER): Payer: Medicare Other | Admitting: Hematology & Oncology

## 2014-04-19 VITALS — BP 136/59 | HR 53 | Temp 98.5°F | Resp 16 | Ht 65.0 in | Wt 125.0 lb

## 2014-04-19 DIAGNOSIS — C8330 Diffuse large B-cell lymphoma, unspecified site: Secondary | ICD-10-CM

## 2014-04-19 DIAGNOSIS — C8589 Other specified types of non-Hodgkin lymphoma, extranodal and solid organ sites: Secondary | ICD-10-CM

## 2014-04-19 DIAGNOSIS — N189 Chronic kidney disease, unspecified: Secondary | ICD-10-CM

## 2014-04-19 DIAGNOSIS — D631 Anemia in chronic kidney disease: Secondary | ICD-10-CM

## 2014-04-19 DIAGNOSIS — N039 Chronic nephritic syndrome with unspecified morphologic changes: Secondary | ICD-10-CM

## 2014-04-19 LAB — CBC WITH DIFFERENTIAL (CANCER CENTER ONLY)
BASO#: 0 10*3/uL (ref 0.0–0.2)
BASO%: 0.6 % (ref 0.0–2.0)
EOS%: 7.9 % — ABNORMAL HIGH (ref 0.0–7.0)
Eosinophils Absolute: 0.3 10*3/uL (ref 0.0–0.5)
HCT: 38.7 % (ref 34.8–46.6)
HGB: 12.8 g/dL (ref 11.6–15.9)
LYMPH#: 2 10*3/uL (ref 0.9–3.3)
LYMPH%: 60.2 % — ABNORMAL HIGH (ref 14.0–48.0)
MCH: 34.8 pg — ABNORMAL HIGH (ref 26.0–34.0)
MCHC: 33.1 g/dL (ref 32.0–36.0)
MCV: 105 fL — AB (ref 81–101)
MONO#: 0.5 10*3/uL (ref 0.1–0.9)
MONO%: 16.1 % — ABNORMAL HIGH (ref 0.0–13.0)
NEUT#: 0.5 10*3/uL — CL (ref 1.5–6.5)
NEUT%: 15.2 % — AB (ref 39.6–80.0)
Platelets: 61 10*3/uL — ABNORMAL LOW (ref 145–400)
RBC: 3.68 10*6/uL — ABNORMAL LOW (ref 3.70–5.32)
RDW: 15 % (ref 11.1–15.7)
WBC: 3.3 10*3/uL — ABNORMAL LOW (ref 3.9–10.0)

## 2014-04-19 LAB — COMPREHENSIVE METABOLIC PANEL
AST: 11 U/L (ref 0–37)
Albumin: 4 g/dL (ref 3.5–5.2)
Alkaline Phosphatase: 70 U/L (ref 39–117)
BILIRUBIN TOTAL: 0.4 mg/dL (ref 0.2–1.2)
BUN: 43 mg/dL — AB (ref 6–23)
CO2: 32 mEq/L (ref 19–32)
CREATININE: 6.83 mg/dL — AB (ref 0.50–1.10)
Calcium: 8.9 mg/dL (ref 8.4–10.5)
Chloride: 94 mEq/L — ABNORMAL LOW (ref 96–112)
Glucose, Bld: 144 mg/dL — ABNORMAL HIGH (ref 70–99)
Potassium: 5.8 mEq/L — ABNORMAL HIGH (ref 3.5–5.3)
Sodium: 139 mEq/L (ref 135–145)
Total Protein: 6 g/dL (ref 6.0–8.3)

## 2014-04-20 NOTE — Progress Notes (Signed)
Hematology and Oncology Follow Up Visit  [name] [id number] Sep 16, 1941 73 y.o. 04/20/2014   Principle Diagnosis:   Recurrent diffuse large cell non-Hodgkin's lymphoma  Chronic renal failure on hemodialysis  Paroxysmal atrial fibrillation  Current Therapy:    Observation     Interim History:  [name] is back for followup. Her last chemotherapy was back in April. [name] will has been receiving Rituxan/gemcitabine/oxaliplatin. [name] had done well. Her last PET scan done in May showed no obvious lymphoma active.  [name] is on dialysis. [name] gets this 3 days a week. [name] is doing okay with this. It is tender tire her out.  [name]'s had no problems with the atrial fibrillation. [name] is on medication for this. [name] is not on any anticoagulation because of her thrombocytopenia.  [name]'s had no bleeding. [name] has some bruising.  Has been doing okay. [name]'s had no nausea vomiting. [name]'s had no visual issues. There's been no headache.  Overall, her performance status is ECOG 2-3.  Medications: Current outpatient prescriptions:amiodarone (PACERONE) 200 MG tablet, Take 200 mg by mouth daily., Disp: , Rfl: ;  atorvastatin (LIPITOR) 10 MG tablet, Take 10 mg by mouth daily., Disp: , Rfl: ;  dexamethasone (DECADRON) 4 MG tablet, Take 2 tabs daily with a meal for 3 days starting the day after chemo., Disp: 24 tablet, Rfl: 2 docusate sodium 100 MG CAPS, Take 200 mg by mouth 3 (three) times daily as needed for constipation., Disp: 60 capsule, Rfl: 2;  esomeprazole (NEXIUM) 40 MG capsule, Take 40 mg by mouth daily. , Disp: , Rfl: ;  famciclovir (FAMVIR) 125 MG tablet, Take 1 tablet on T-Th-Sat AFTER dialysis, Disp: 30 tablet, Rfl: 2;  fentaNYL (DURAGESIC - DOSED MCG/HR) 12 MCG/HR, Place 1 patch (12.5 mcg total) onto the skin every 3 (three) days., Disp: 10 patch, Rfl: 0 lidocaine-prilocaine (EMLA) cream, Apply 1 application topically as needed (for port-a-cath access)., Disp: , Rfl: ;  loratadine (CLARITIN) 10  MG tablet, Take 10 mg by mouth daily as needed for allergies. , Disp: , Rfl: ;  metoprolol tartrate (LOPRESSOR) 25 MG tablet, Take 1 tablet (25 mg total) by mouth 2 (two) times daily., Disp: 60 tablet, Rfl: 0 ondansetron (ZOFRAN-ODT) 8 MG disintegrating tablet, TAKE 1 TABLET BY MOUTH EVERY 8 HOURS AS NEEDED FOR NAUSEA, Disp: 20 tablet, Rfl: 0;  prochlorperazine (COMPAZINE) 10 MG tablet, Take 0.5-1 tablets (5-10 mg total) by mouth every 6 (six) hours as needed for nausea or vomiting., Disp: 60 tablet, Rfl: 3;  RENVELA 800 MG tablet, Take 800 mg by mouth 3 (three) times daily with meals. , Disp: , Rfl:  traMADol (ULTRAM) 50 MG tablet, Take 50 mg by mouth every 6 (six) hours as needed for moderate pain., Disp: , Rfl:   Allergies:  Allergies  Allergen Reactions  . Advicor [Niacin-Lovastatin Er]     Muscle cramps  . Crestor [Rosuvastatin]     Muscle cramps  . Lipitor [Atorvastatin]     Muscle cramps  . Lovastatin     Muscle cramps  . Morphine And Related Other (See Comments)    "made me crazy"  . Niaspan [Niacin Er]     Muscle cramps  . Nsaids     Gi side effects  . Pravachol [Pravastatin Sodium]     Gi side effects  . Sulfa Antibiotics Other (See Comments)    Can't remember   . Vytorin [Ezetimibe-Simvastatin] Nausea Only  . Welchol [Colesevelam Hcl]     Muscle cramps  .  Zetia [Ezetimibe]     Muscle cramps   . Zocor [Simvastatin]     Muscle cramps     Past Medical History, Surgical history, Social history, and Family History were reviewed and updated.  Review of Systems: As above  Physical Exam:  height is 5\' 5"  (1.651 m) and weight is 125 lb (56.7 kg). Her oral temperature is 98.5 F (36.9 C). Her blood pressure is 136/59 and her pulse is 53. Her respiration is 16.   Petite, elderly white female. Head and neck exam shows no ocular or oral lesions. There are no palpable cervical or supraclavicular lymph nodes. Lungs are clear bilaterally. Cardiac exam regular rate and rhythm  consistent with atrial fibrillation. No murmurs are noted. Abdomen is soft. There is no fluid wave. There is no palpable liver or spleen tip. Axillary exam shows no bilateral axillary adenopathy. Extremities shows no clubbing cyanosis or edema. [name] has some slight nonpitting edema of the left upper extremity where her AV fistula is. [name] has ecchymoses noted in the left upper extremity. Neurological exam is nonfocal.  Lab Results  Component Value Date   WBC 3.3* 04/19/2014   HGB 12.8 04/19/2014   HCT 38.7 04/19/2014   MCV 105* 04/19/2014   PLT 61* 04/19/2014     Chemistry      Component Value Date/Time   NA 139 04/19/2014 0956   NA 140 03/08/2014 0835   K 5.8* 04/19/2014 0956   K 3.8 03/08/2014 0835   CL 94* 04/19/2014 0956   CL 95* 03/08/2014 0835   CO2 32 04/19/2014 0956   CO2 35* 03/08/2014 0835   BUN 43* 04/19/2014 0956   BUN 25* 03/08/2014 0835   CREATININE 6.83* 04/19/2014 0956   CREATININE 5.9* 03/08/2014 0835      Component Value Date/Time   CALCIUM 8.9 04/19/2014 0956   CALCIUM 9.4 03/08/2014 0835   ALKPHOS 70 04/19/2014 0956   ALKPHOS 95* 03/08/2014 0835   AST 11 04/19/2014 0956   AST 26 03/08/2014 0835   ALT <8 04/19/2014 0956   ALT 10 03/08/2014 0835   BILITOT 0.4 04/19/2014 0956   BILITOT 0.40 03/08/2014 0835         Impression and Plan: [name] is 73 year old female. [name] has recurrence of her lymphoma. Her last chemotherapy was 2 months ago.  On just surprised that [name] has the thrombocytopenia. I suppose this could be from the chemotherapy 2 months ago.  I think that her bone marrow is definitely "stressed" from all the chemotherapy that [name] has had.  I just want to continue to follow her without therapy. I just don't think we are really helping her or making her life better by today her right now. Her last PET scan does not show any obvious disease.  I am sure that [name] will recur at some point. However, I feel that we just need to let her strengthen herself.  I want to  see her back in another 6 weeks.   Volanda Napoleon, MD 6/23/20157:22 AM

## 2014-04-21 ENCOUNTER — Other Ambulatory Visit: Payer: Self-pay | Admitting: Hematology & Oncology

## 2014-05-31 ENCOUNTER — Encounter: Payer: Self-pay | Admitting: Hematology & Oncology

## 2014-05-31 ENCOUNTER — Ambulatory Visit (HOSPITAL_BASED_OUTPATIENT_CLINIC_OR_DEPARTMENT_OTHER): Payer: Medicare Other | Admitting: Hematology & Oncology

## 2014-05-31 ENCOUNTER — Other Ambulatory Visit (HOSPITAL_BASED_OUTPATIENT_CLINIC_OR_DEPARTMENT_OTHER): Payer: Medicare Other | Admitting: Lab

## 2014-05-31 ENCOUNTER — Telehealth: Payer: Self-pay | Admitting: Hematology & Oncology

## 2014-05-31 VITALS — BP 163/50 | HR 56 | Temp 97.7°F | Resp 16 | Ht 65.0 in | Wt 127.0 lb

## 2014-05-31 DIAGNOSIS — N189 Chronic kidney disease, unspecified: Secondary | ICD-10-CM

## 2014-05-31 DIAGNOSIS — D631 Anemia in chronic kidney disease: Secondary | ICD-10-CM

## 2014-05-31 DIAGNOSIS — C8330 Diffuse large B-cell lymphoma, unspecified site: Secondary | ICD-10-CM

## 2014-05-31 DIAGNOSIS — C8589 Other specified types of non-Hodgkin lymphoma, extranodal and solid organ sites: Secondary | ICD-10-CM

## 2014-05-31 LAB — CBC WITH DIFFERENTIAL (CANCER CENTER ONLY)
BASO#: 0 10*3/uL (ref 0.0–0.2)
BASO%: 0.2 % (ref 0.0–2.0)
EOS%: 5.9 % (ref 0.0–7.0)
Eosinophils Absolute: 0.3 10*3/uL (ref 0.0–0.5)
HEMATOCRIT: 32.5 % — AB (ref 34.8–46.6)
HEMOGLOBIN: 10.4 g/dL — AB (ref 11.6–15.9)
LYMPH#: 2 10*3/uL (ref 0.9–3.3)
LYMPH%: 44.5 % (ref 14.0–48.0)
MCH: 32.6 pg (ref 26.0–34.0)
MCHC: 32 g/dL (ref 32.0–36.0)
MCV: 102 fL — AB (ref 81–101)
MONO#: 0.8 10*3/uL (ref 0.1–0.9)
MONO%: 16.5 % — ABNORMAL HIGH (ref 0.0–13.0)
NEUT#: 1.5 10*3/uL (ref 1.5–6.5)
NEUT%: 32.9 % — AB (ref 39.6–80.0)
Platelets: 138 10*3/uL — ABNORMAL LOW (ref 145–400)
RBC: 3.19 10*6/uL — ABNORMAL LOW (ref 3.70–5.32)
RDW: 13.7 % (ref 11.1–15.7)
WBC: 4.5 10*3/uL (ref 3.9–10.0)

## 2014-05-31 LAB — CMP (CANCER CENTER ONLY)
ALBUMIN: 3.4 g/dL (ref 3.3–5.5)
ALK PHOS: 73 U/L (ref 26–84)
ALT(SGPT): 11 U/L (ref 10–47)
AST: 18 U/L (ref 11–38)
BUN, Bld: 34 mg/dL — ABNORMAL HIGH (ref 7–22)
CO2: 32 meq/L (ref 18–33)
Calcium: 8.8 mg/dL (ref 8.0–10.3)
Chloride: 88 mEq/L — ABNORMAL LOW (ref 98–108)
Creat: 6.6 mg/dl (ref 0.6–1.2)
Glucose, Bld: 107 mg/dL (ref 73–118)
POTASSIUM: 4.5 meq/L (ref 3.3–4.7)
SODIUM: 141 meq/L (ref 128–145)
TOTAL PROTEIN: 6.4 g/dL (ref 6.4–8.1)
Total Bilirubin: 0.4 mg/dl (ref 0.20–1.60)

## 2014-05-31 LAB — LACTATE DEHYDROGENASE: LDH: 179 U/L (ref 94–250)

## 2014-05-31 LAB — CHCC SATELLITE - SMEAR

## 2014-05-31 NOTE — Progress Notes (Signed)
Hematology and Oncology Follow Up Visit  [name] [id number] 05/14/1941 73 y.o. 05/31/2014   Principle Diagnosis:   Recurrent diffuse large cell non-Hodgkin's lymphoma  Chronic renal failure on hemodialysis  Paroxysmal atrial fibrillation  Current Therapy:    Patient is on hemodialysis     Interim History:  [name] is back for followup. She is doing okay. Her appetite is down a little bit according to her husband. Unfortunately, she is having some pain in her left upper arm. This is where she has her AV fistula. She says the pain goes up into her shoulder and neck. There is a little swelling in the left arm. It's not erythematous.  I told her if she goes to dialysis tomorrow to let the doctor know and he can assess that and see if she needs to get back to the vascular surgeon who put it in.  She's had no facial pain. There's been no facial swelling on the right side.  She's had no diarrhea. She's had no rashes. She's had no fever. She's had no cough. She does get some fatigue.  Overall, her performance status is ECOG 2-3.  Medications: Current outpatient prescriptions:amiodarone (PACERONE) 200 MG tablet, Take 200 mg by mouth daily., Disp: , Rfl: ;  CRESTOR 10 MG tablet, Take 10 mg by mouth daily. , Disp: , Rfl: ;  DiphenhydrAMINE HCl, Sleep, 25 MG TBDP, Take by mouth at bedtime as needed., Disp: , Rfl: ;  docusate sodium 100 MG CAPS, Take 200 mg by mouth 3 (three) times daily as needed for constipation., Disp: 60 capsule, Rfl: 2 metoprolol tartrate (LOPRESSOR) 25 MG tablet, Take 1 tablet (25 mg total) by mouth 2 (two) times daily., Disp: 60 tablet, Rfl: 0;  prochlorperazine (COMPAZINE) 10 MG tablet, Take 0.5-1 tablets (5-10 mg total) by mouth every 6 (six) hours as needed for nausea or vomiting., Disp: 60 tablet, Rfl: 3;  RENVELA 800 MG tablet, Take 800 mg by mouth 3 (three) times daily with meals. TAKES 3 TABS WITH EACH MEAL, Disp: , Rfl:  traMADol (ULTRAM) 50 MG tablet,  Take 50 mg by mouth every 6 (six) hours as needed for moderate pain., Disp: , Rfl: ;  fentaNYL (DURAGESIC - DOSED MCG/HR) 12 MCG/HR, Place 1 patch (12.5 mcg total) onto the skin every 3 (three) days., Disp: 10 patch, Rfl: 0;  lidocaine-prilocaine (EMLA) cream, Apply 1 application topically as needed (for port-a-cath access)., Disp: , Rfl:   Allergies:  Allergies  Allergen Reactions  . Advicor [Niacin-Lovastatin Er]     Muscle cramps  . Crestor [Rosuvastatin]     Muscle cramps  . Lipitor [Atorvastatin]     Muscle cramps  . Lovastatin     Muscle cramps  . Morphine And Related Other (See Comments)    "made me crazy"  . Niaspan [Niacin Er]     Muscle cramps  . Nsaids     Gi side effects  . Pravachol [Pravastatin Sodium]     Gi side effects  . Sulfa Antibiotics Other (See Comments)    Can't remember   . Vytorin [Ezetimibe-Simvastatin] Nausea Only  . Welchol [Colesevelam Hcl]     Muscle cramps  . Zetia [Ezetimibe]     Muscle cramps   . Zocor [Simvastatin]     Muscle cramps     Past Medical History, Surgical history, Social history, and Family History were reviewed and updated.  Review of Systems: As above  Physical Exam:  height is 5\' 5"  (1.651 m)  and weight is 127 lb (57.607 kg). Her oral temperature is 97.7 F (36.5 C). Her blood pressure is 163/50 and her pulse is 56. Her respiration is 16.   Elderly, petite white female. Head and neck exam shows no ocular or oral lesions. There are no palpable cervical or supraclavicular lymph nodes. Lungs are clear bilaterally. Cardiac exam regular rate and rhythm with no murmurs rubs or bruits. Abdomen is soft. Shows good bowel sounds. There is no fluid wave. There is no palpable liver or spleen tip. Back exam no tenderness over the spine ribs or hips. Extremities shows the AV fistula in the left overall. She has some slight swelling. There is no pain. There is no erythema. She has decent pulses in the distal extremities. Neurological exam  is nonfocal.  Lab Results  Component Value Date   WBC 4.5 05/31/2014   HGB 10.4* 05/31/2014   HCT 32.5* 05/31/2014   MCV 102* 05/31/2014   PLT 138* 05/31/2014     Chemistry      Component Value Date/Time   NA 141 05/31/2014 1046   NA 139 04/19/2014 0956   K 4.5 05/31/2014 1046   K 5.8* 04/19/2014 0956   CL 88* 05/31/2014 1046   CL 94* 04/19/2014 0956   CO2 32 05/31/2014 1046   CO2 32 04/19/2014 0956   BUN 34* 05/31/2014 1046   BUN 43* 04/19/2014 0956   CREATININE 6.6* 05/31/2014 1046   CREATININE 6.83* 04/19/2014 0956      Component Value Date/Time   CALCIUM 8.8 05/31/2014 1046   CALCIUM 8.9 04/19/2014 0956   ALKPHOS 73 05/31/2014 1046   ALKPHOS 70 04/19/2014 0956   AST 18 05/31/2014 1046   AST 11 04/19/2014 0956   ALT 11 05/31/2014 1046   ALT <8 04/19/2014 0956   BILITOT 0.40 05/31/2014 1046   BILITOT 0.4 04/19/2014 0956         Impression and Plan: [name] is 73 year old white female with a history of recurrent non-Hodgkin's lymphoma. We actually got very fortunate and had a very good response to Rituxan/gemcitabine/oxaliplatin. She began to have a tough time with this and we've not had to give her any therapy now probably for a good 2 months.  Am glad that her blood counts are doing better. I let her quality of life is doing fairly well.  I will plan to get her back in another month or so.  I don't see that we have to do any scans on her right now.   Volanda Napoleon, MD 8/3/20156:56 PM

## 2014-05-31 NOTE — Telephone Encounter (Signed)
Mailed 9-1 schedule

## 2014-06-13 ENCOUNTER — Other Ambulatory Visit: Payer: Self-pay | Admitting: Hematology & Oncology

## 2014-06-28 ENCOUNTER — Ambulatory Visit (HOSPITAL_BASED_OUTPATIENT_CLINIC_OR_DEPARTMENT_OTHER): Payer: Medicare Other

## 2014-06-28 ENCOUNTER — Other Ambulatory Visit (HOSPITAL_BASED_OUTPATIENT_CLINIC_OR_DEPARTMENT_OTHER): Payer: Medicare Other | Admitting: Lab

## 2014-06-28 ENCOUNTER — Ambulatory Visit (HOSPITAL_BASED_OUTPATIENT_CLINIC_OR_DEPARTMENT_OTHER): Payer: Medicare Other | Admitting: Family

## 2014-06-28 ENCOUNTER — Encounter: Payer: Self-pay | Admitting: Hematology & Oncology

## 2014-06-28 VITALS — BP 179/48 | HR 50 | Temp 98.4°F | Resp 16 | Ht 65.0 in | Wt 128.0 lb

## 2014-06-28 DIAGNOSIS — C8330 Diffuse large B-cell lymphoma, unspecified site: Secondary | ICD-10-CM

## 2014-06-28 DIAGNOSIS — N185 Chronic kidney disease, stage 5: Secondary | ICD-10-CM

## 2014-06-28 DIAGNOSIS — C8589 Other specified types of non-Hodgkin lymphoma, extranodal and solid organ sites: Secondary | ICD-10-CM

## 2014-06-28 DIAGNOSIS — N039 Chronic nephritic syndrome with unspecified morphologic changes: Secondary | ICD-10-CM

## 2014-06-28 DIAGNOSIS — N189 Chronic kidney disease, unspecified: Principal | ICD-10-CM

## 2014-06-28 DIAGNOSIS — D631 Anemia in chronic kidney disease: Secondary | ICD-10-CM

## 2014-06-28 DIAGNOSIS — Z992 Dependence on renal dialysis: Secondary | ICD-10-CM

## 2014-06-28 DIAGNOSIS — K5900 Constipation, unspecified: Secondary | ICD-10-CM

## 2014-06-28 LAB — CBC WITH DIFFERENTIAL (CANCER CENTER ONLY)
BASO#: 0 10*3/uL (ref 0.0–0.2)
BASO%: 0.2 % (ref 0.0–2.0)
EOS ABS: 0.2 10*3/uL (ref 0.0–0.5)
EOS%: 3.9 % (ref 0.0–7.0)
HCT: 26 % — ABNORMAL LOW (ref 34.8–46.6)
HEMOGLOBIN: 8.4 g/dL — AB (ref 11.6–15.9)
LYMPH#: 2.9 10*3/uL (ref 0.9–3.3)
LYMPH%: 66.1 % — ABNORMAL HIGH (ref 14.0–48.0)
MCH: 33.1 pg (ref 26.0–34.0)
MCHC: 32.3 g/dL (ref 32.0–36.0)
MCV: 102 fL — ABNORMAL HIGH (ref 81–101)
MONO#: 0.6 10*3/uL (ref 0.1–0.9)
MONO%: 14.5 % — ABNORMAL HIGH (ref 0.0–13.0)
NEUT%: 15.3 % — ABNORMAL LOW (ref 39.6–80.0)
NEUTROS ABS: 0.7 10*3/uL — AB (ref 1.5–6.5)
PLATELETS: 147 10*3/uL (ref 145–400)
RBC: 2.54 10*6/uL — ABNORMAL LOW (ref 3.70–5.32)
RDW: 15.9 % — ABNORMAL HIGH (ref 11.1–15.7)
WBC: 4.4 10*3/uL (ref 3.9–10.0)

## 2014-06-28 LAB — CMP (CANCER CENTER ONLY)
ALK PHOS: 66 U/L (ref 26–84)
ALT: 10 U/L (ref 10–47)
AST: 17 U/L (ref 11–38)
Albumin: 3.5 g/dL (ref 3.3–5.5)
BILIRUBIN TOTAL: 0.5 mg/dL (ref 0.20–1.60)
BUN, Bld: 25 mg/dL — ABNORMAL HIGH (ref 7–22)
CO2: 30 mEq/L (ref 18–33)
Calcium: 8.6 mg/dL (ref 8.0–10.3)
Chloride: 94 mEq/L — ABNORMAL LOW (ref 98–108)
Creat: 5.8 mg/dl (ref 0.6–1.2)
Glucose, Bld: 101 mg/dL (ref 73–118)
Potassium: 3.9 mEq/L (ref 3.3–4.7)
SODIUM: 139 meq/L (ref 128–145)
Total Protein: 6.1 g/dL — ABNORMAL LOW (ref 6.4–8.1)

## 2014-06-28 MED ORDER — DARBEPOETIN ALFA-POLYSORBATE 300 MCG/0.6ML IJ SOLN
300.0000 ug | Freq: Once | INTRAMUSCULAR | Status: AC
  Administered 2014-06-28: 300 ug via SUBCUTANEOUS

## 2014-06-28 MED ORDER — DARBEPOETIN ALFA-POLYSORBATE 300 MCG/0.6ML IJ SOLN
INTRAMUSCULAR | Status: AC
  Filled 2014-06-28: qty 0.6

## 2014-06-28 MED ORDER — LACTULOSE 10 GM/15ML PO SOLN: 10.0000 g | Freq: Three times a day (TID) | ORAL | Status: DC

## 2014-06-28 NOTE — Progress Notes (Signed)
[place]  [phone] [phone]  ID: [name] OB: 21-Aug-1941 MR#: [id number]#:[id number] Patient Care Team: [name], MD as PCP - General Estanislado Emms, MD as Consulting Physician (Nephrology) Jacolyn Reedy, MD as Consulting Physician (Cardiology)  DIAGNOSIS: Recurrent diffuse large cell non-Hodgkin's lymphoma  Chronic renal failure on hemodialysis  Paroxysmal atrial fibrillation  INTERVAL HISTORY: [name] is here today for follow-up. She is doing okay. She has not had a bowel movement in over a week despite taking Dulcolax 6 times daily and Miralax. She had dialysis last week and she felt dehydrated afterwards. Her energy is good. She was a little dizzy this morning when she got up. She denies pain or bleeding. She has had no fever, chills, n/v, cough, rash, headache, SOB, chest pain, palpitations, abdominal pain, diarrhea, blood in urine or stool. She has had no swelling, tenderness, numbness or tingling in her extremities. Her appetite is better and she is trying to drink more fluids. She hs dialysis tomorrow. We discussed her keeping a dialysis journal because she feels she doesn't understand the process. I encouraged her to speak with the nurses and her nephrologist and have them explain how that works. Her Hgb is down at 8.4 today.   CURRENT TREATMENT: Patient is on hemodialysis  REVIEW OF SYSTEMS: All other 10 point review of systems is negative except for those issues mentioned above.   PAST MEDICAL HISTORY: Past Medical History  Diagnosis Date  . Hypertension   . Anxiety     h/o anxiety attacks  . Anemia of renal disease   . Claustrophobia   . Panic attacks   . 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 CAD (coronary artery disease)   . Atrial fibrillation   . High cholesterol     hx  . Type 2 diabetes mellitus     "don't take RX now" (11/04/2013)  . Arthritis     "left hip"  (11/04/2013)  . ESRD (end stage renal disease) on dialysis 2014    "[employer]; TTS" (11/04/2013)  . Large cell (diffuse) non-Hodgkin's lymphoma dx'd ?2011   PAST SURGICAL HISTORY: Past Surgical History  Procedure Laterality Date  . Appendectomy  2009  . Cardiac catheterization  2011  . Shoulder open rotator cuff repair Right ?2013  . Esophagogastroduodenoscopy (egd) with propofol  10/06/2012    Procedure: ESOPHAGOGASTRODUODENOSCOPY (EGD) WITH PROPOFOL;  Surgeon: Arta Silence, MD;  Location: [facility];  Service: Endoscopy;  Laterality: N/A;  . Colonoscopy with propofol  10/06/2012    Procedure: COLONOSCOPY WITH PROPOFOL;  Surgeon: Arta Silence, MD;  Location: [facility];  Service: Endoscopy;  Laterality: N/A;  . Colonoscopy with propofol N/A 01/14/2013    Procedure: COLONOSCOPY WITH PROPOFOL;  Surgeon: Arta Silence, MD;  Location: [facility];  Service: Endoscopy;  Laterality: N/A;  . Portacath placement Right 01/22/2013  . Av fistula placement Left 07/24/2013    Procedure: ARTERIOVENOUS (AV) FISTULA CREATION- LEFT ;  Surgeon: Angelia Mould, MD;  Location: [facility];  Service: Vascular;  Laterality: Left;  . Abdominal hysterectomy  1980's  . Tonsillectomy  1954   FAMILY HISTORY Family History  Problem Relation Age of Onset  . Cancer Mother     throat  . Alzheimer's disease Sister   . Cancer Brother     lymphoma  . Cancer Sister     unknown type   GYNECOLOGIC HISTORY:  No LMP recorded. Patient  has had a hysterectomy.   SOCIAL HISTORY:  History   Social History  . Marital Status: Married    Spouse Name: N/A    Number of Children: N/A  . Years of Education: N/A   Occupational History  . Not on file.   Social History Main Topics  . Smoking status: Former Smoker -- 0.25 packs/day for 15 years    Types: Cigarettes    Start date: 11/25/1948    Quit date: 04/01/1964  . Smokeless tobacco: Never Used     Comment: quit 59 years ago  . Alcohol Use: No  . Drug  Use: No  . Sexual Activity: Not Currently   Other Topics Concern  . Not on file   Social History Narrative  . No narrative on file   ADVANCED DIRECTIVES: <no information>  HEALTH MAINTENANCE: History  Substance Use Topics  . Smoking status: Former Smoker -- 0.25 packs/day for 15 years    Types: Cigarettes    Start date: 11/25/1948    Quit date: 04/01/1964  . Smokeless tobacco: Never Used     Comment: quit 59 years ago  . Alcohol Use: No   Colonoscopy: PAP: Bone density: Lipid panel:  Allergies  Allergen Reactions  . Advicor [Niacin-Lovastatin Er]     Muscle cramps  . Crestor [Rosuvastatin]     Muscle cramps  . Lipitor [Atorvastatin]     Muscle cramps  . Lovastatin     Muscle cramps  . Morphine And Related Other (See Comments)    "made me crazy"  . Niaspan [Niacin Er]     Muscle cramps  . Nsaids     Gi side effects  . Pravachol [Pravastatin Sodium]     Gi side effects  . Sulfa Antibiotics Other (See Comments)    Can't remember   . Vytorin [Ezetimibe-Simvastatin] Nausea Only  . Welchol [Colesevelam Hcl]     Muscle cramps  . Zetia [Ezetimibe]     Muscle cramps   . Zocor [Simvastatin]     Muscle cramps    Current Outpatient Prescriptions  Medication Sig Dispense Refill  . amiodarone (PACERONE) 200 MG tablet Take 200 mg by mouth daily.      . CRESTOR 10 MG tablet Take 10 mg by mouth daily.       . DiphenhydrAMINE HCl, Sleep, 25 MG TBDP Take by mouth at bedtime as needed.      . docusate sodium 100 MG CAPS Take 200 mg by mouth 3 (three) times daily as needed for constipation.  60 capsule  2  . famciclovir (FAMVIR) 250 MG tablet TAKE 1/2 TABLET BY MOUTH ON TUESDAY, THURSDAY, AND SATURDAY AFTER DIALYSIS  15 tablet  0  . fentaNYL (DURAGESIC - DOSED MCG/HR) 12 MCG/HR Place 1 patch (12.5 mcg total) onto the skin every 3 (three) days.  10 patch  0  . lactulose (CHRONULAC) 10 GM/15ML solution Take 15 mLs (10 g total) by mouth 3 (three) times daily.  500 mL  0  .  lidocaine-prilocaine (EMLA) cream Apply 1 application topically as needed (for port-a-cath access).      . metoprolol tartrate (LOPRESSOR) 25 MG tablet Take 1 tablet (25 mg total) by mouth 2 (two) times daily.  60 tablet  0  . prochlorperazine (COMPAZINE) 10 MG tablet Take 0.5-1 tablets (5-10 mg total) by mouth every 6 (six) hours as needed for nausea or vomiting.  60 tablet  3  . RENVELA 800 MG tablet Take 800 mg by mouth 3 (three)  times daily with meals. TAKES 3 TABS WITH EACH MEAL      . traMADol (ULTRAM) 50 MG tablet Take 50 mg by mouth every 6 (six) hours as needed for moderate pain.       Current Facility-Administered Medications  Medication Dose Route Frequency Provider Last Rate Last Dose  . darbepoetin (ARANESP) injection 300 mcg  300 mcg Subcutaneous Once Eliezer Bottom, NP       OBJECTIVE: Filed Vitals:   06/28/14 1518  BP: 179/48  Pulse: 50  Temp: 98.4 F (36.9 C)  Resp: 16   Body mass index is 21.3 kg/(m^2). ECOG FS:0 - Asymptomatic Ocular: Sclerae unicteric, pupils equal, round and reactive to light Ear-nose-throat: Oropharynx clear, dentition fair Lymphatic: No cervical or supraclavicular adenopathy Lungs no rales or rhonchi, good excursion bilaterally Heart regular rate and rhythm, no murmur appreciated Abd soft, nontender, positive bowel sounds MSK no focal spinal tenderness, no joint edema Neuro: non-focal, well-oriented, appropriate affect Breasts: Deferred  LAB RESULTS: CMP     Component Value Date/Time   NA 139 06/28/2014 1459   NA 139 04/19/2014 0956   K 3.9 06/28/2014 1459   K 5.8* 04/19/2014 0956   CL 94* 06/28/2014 1459   CL 94* 04/19/2014 0956   CO2 30 06/28/2014 1459   CO2 32 04/19/2014 0956   GLUCOSE 101 06/28/2014 1459   GLUCOSE 144* 04/19/2014 0956   BUN 25* 06/28/2014 1459   BUN 43* 04/19/2014 0956   CREATININE 5.8* 06/28/2014 1459   CREATININE 6.83* 04/19/2014 0956   CALCIUM 8.6 06/28/2014 1459   CALCIUM 8.9 04/19/2014 0956   PROT 6.1* 06/28/2014  1459   PROT 6.0 04/19/2014 0956   ALBUMIN 4.0 04/19/2014 0956   AST 17 06/28/2014 1459   AST 11 04/19/2014 0956   ALT 10 06/28/2014 1459   ALT <8 04/19/2014 0956   ALKPHOS 66 06/28/2014 1459   ALKPHOS 70 04/19/2014 0956   BILITOT 0.50 06/28/2014 1459   BILITOT 0.4 04/19/2014 0956   GFRNONAA 11* 12/20/2013 0330   GFRAA 13* 12/20/2013 0330   No results found for this basename: SPEP, UPEP,  kappa and lambda light chains   Lab Results  Component Value Date   WBC 4.4 06/28/2014   NEUTROABS 0.7* 06/28/2014   HGB 8.4* 06/28/2014   HCT 26.0* 06/28/2014   MCV 102* 06/28/2014   PLT 147 06/28/2014   No results found for this basename: LABCA2   No components found with this basename: [id number]   No results found for this basename: INR,  in the last 168 hours  STUDIES: No results found.  ASSESSMENT/PLAN: [name] is 73 year old white female with a history of recurrent non-Hodgkin's lymphoma. She had a very good response to Rituxan/gemcitabine/oxaliplatin. She began having a difficult time with this. She has not had any therapy in a few months.  She is anemic today with a Hgb of 8.4.  We will give her Aranesp today. Hopefully this will help bring her Hgb back up.  She may also need blood with dialysis tomorrow.  I sent a prescription for Lactulose to her pharmacy. This should help with her constipation.  We will see her back in 6 weeks for labs and follow-up.  She is in agreement with this and knows to call here with any questions or concerns. We can certainly see her sooner if need be.    Eliezer Bottom, NP 06/28/2014 4:16 PM

## 2014-06-29 ENCOUNTER — Other Ambulatory Visit: Payer: Medicare Other | Admitting: Lab

## 2014-06-29 ENCOUNTER — Ambulatory Visit: Payer: Medicare Other | Admitting: Hematology & Oncology

## 2014-06-29 ENCOUNTER — Encounter: Payer: Self-pay | Admitting: Hematology & Oncology

## 2014-06-29 LAB — IRON AND TIBC CHCC
%SAT: 32 % (ref 21–57)
IRON: 70 ug/dL (ref 41–142)
TIBC: 220 ug/dL — AB (ref 236–444)
UIBC: 150 ug/dL (ref 120–384)

## 2014-06-29 LAB — FERRITIN CHCC: FERRITIN: 761 ng/mL — AB (ref 9–269)

## 2014-07-17 ENCOUNTER — Emergency Department (HOSPITAL_COMMUNITY)
Admission: EM | Admit: 2014-07-17 | Discharge: 2014-07-17 | Disposition: A | Discharge status: Home / Self Care | Payer: Medicare Other | Attending: Emergency Medicine | Admitting: Emergency Medicine

## 2014-07-17 ENCOUNTER — Emergency Department (HOSPITAL_COMMUNITY): Payer: Medicare Other

## 2014-07-17 ENCOUNTER — Encounter (HOSPITAL_COMMUNITY): Payer: Self-pay | Admitting: Emergency Medicine

## 2014-07-17 DIAGNOSIS — Z90710 Acquired absence of both cervix and uterus: Secondary | ICD-10-CM | POA: Diagnosis not present

## 2014-07-17 DIAGNOSIS — R6883 Chills (without fever): Secondary | ICD-10-CM | POA: Diagnosis not present

## 2014-07-17 DIAGNOSIS — Z992 Dependence on renal dialysis: Secondary | ICD-10-CM | POA: Diagnosis not present

## 2014-07-17 DIAGNOSIS — Z9089 Acquired absence of other organs: Secondary | ICD-10-CM | POA: Insufficient documentation

## 2014-07-17 DIAGNOSIS — E119 Type 2 diabetes mellitus without complications: Secondary | ICD-10-CM | POA: Insufficient documentation

## 2014-07-17 DIAGNOSIS — R112 Nausea with vomiting, unspecified: Secondary | ICD-10-CM | POA: Diagnosis not present

## 2014-07-17 DIAGNOSIS — R197 Diarrhea, unspecified: Secondary | ICD-10-CM | POA: Insufficient documentation

## 2014-07-17 DIAGNOSIS — Z87891 Personal history of nicotine dependence: Secondary | ICD-10-CM | POA: Diagnosis not present

## 2014-07-17 DIAGNOSIS — Z79899 Other long term (current) drug therapy: Secondary | ICD-10-CM | POA: Insufficient documentation

## 2014-07-17 DIAGNOSIS — Z9889 Other specified postprocedural states: Secondary | ICD-10-CM | POA: Insufficient documentation

## 2014-07-17 DIAGNOSIS — Z87898 Personal history of other specified conditions: Secondary | ICD-10-CM | POA: Insufficient documentation

## 2014-07-17 DIAGNOSIS — Z923 Personal history of irradiation: Secondary | ICD-10-CM | POA: Diagnosis not present

## 2014-07-17 DIAGNOSIS — I2510 Atherosclerotic heart disease of native coronary artery without angina pectoris: Secondary | ICD-10-CM | POA: Insufficient documentation

## 2014-07-17 DIAGNOSIS — F411 Generalized anxiety disorder: Secondary | ICD-10-CM | POA: Insufficient documentation

## 2014-07-17 DIAGNOSIS — IMO0002 Reserved for concepts with insufficient information to code with codable children: Secondary | ICD-10-CM | POA: Diagnosis not present

## 2014-07-17 DIAGNOSIS — I120 Hypertensive chronic kidney disease with stage 5 chronic kidney disease or end stage renal disease: Secondary | ICD-10-CM | POA: Diagnosis not present

## 2014-07-17 DIAGNOSIS — R109 Unspecified abdominal pain: Secondary | ICD-10-CM | POA: Insufficient documentation

## 2014-07-17 DIAGNOSIS — Z862 Personal history of diseases of the blood and blood-forming organs and certain disorders involving the immune mechanism: Secondary | ICD-10-CM | POA: Insufficient documentation

## 2014-07-17 DIAGNOSIS — N186 End stage renal disease: Secondary | ICD-10-CM | POA: Insufficient documentation

## 2014-07-17 LAB — CBC WITH DIFFERENTIAL/PLATELET
Band Neutrophils: 0 % (ref 0–10)
Basophils Absolute: 0 10*3/uL (ref 0.0–0.1)
Basophils Relative: 0 % (ref 0–1)
Blasts: 0 %
Eosinophils Absolute: 0 10*3/uL (ref 0.0–0.7)
Eosinophils Relative: 0 % (ref 0–5)
HCT: 27.1 % — ABNORMAL LOW (ref 36.0–46.0)
Hemoglobin: 8.7 g/dL — ABNORMAL LOW (ref 12.0–15.0)
Lymphocytes Relative: 56 % — ABNORMAL HIGH (ref 12–46)
Lymphs Abs: 1.4 10*3/uL (ref 0.7–4.0)
MCH: 32.1 pg (ref 26.0–34.0)
MCHC: 32.1 g/dL (ref 30.0–36.0)
MCV: 100 fL (ref 78.0–100.0)
Metamyelocytes Relative: 0 %
Monocytes Absolute: 0.3 10*3/uL (ref 0.1–1.0)
Monocytes Relative: 11 % (ref 3–12)
Myelocytes: 0 %
Neutro Abs: 0.8 10*3/uL — ABNORMAL LOW (ref 1.7–7.7)
Neutrophils Relative %: 33 % — ABNORMAL LOW (ref 43–77)
Platelets: 148 10*3/uL — ABNORMAL LOW (ref 150–400)
Promyelocytes Absolute: 0 %
RBC: 2.71 MIL/uL — ABNORMAL LOW (ref 3.87–5.11)
RDW: 18.6 % — ABNORMAL HIGH (ref 11.5–15.5)
WBC: 2.5 10*3/uL — ABNORMAL LOW (ref 4.0–10.5)
nRBC: 0 /100 WBC

## 2014-07-17 LAB — URINE CULTURE
Colony Count: NO GROWTH
Culture: NO GROWTH

## 2014-07-17 LAB — COMPREHENSIVE METABOLIC PANEL
ALT: 14 U/L (ref 0–35)
AST: 18 U/L (ref 0–37)
Albumin: 3.7 g/dL (ref 3.5–5.2)
Alkaline Phosphatase: 83 U/L (ref 39–117)
Anion gap: 21 — ABNORMAL HIGH (ref 5–15)
BUN: 32 mg/dL — ABNORMAL HIGH (ref 6–23)
CO2: 25 mEq/L (ref 19–32)
Calcium: 8.7 mg/dL (ref 8.4–10.5)
Chloride: 95 mEq/L — ABNORMAL LOW (ref 96–112)
Creatinine, Ser: 5.36 mg/dL — ABNORMAL HIGH (ref 0.50–1.10)
GFR calc Af Amer: 8 mL/min — ABNORMAL LOW (ref >90)
GFR calc non Af Amer: 7 mL/min — ABNORMAL LOW (ref >90)
Glucose, Bld: 202 mg/dL — ABNORMAL HIGH (ref 70–99)
Potassium: 4.5 mEq/L (ref 3.7–5.3)
Sodium: 141 mEq/L (ref 137–147)
Total Bilirubin: 0.5 mg/dL (ref 0.3–1.2)
Total Protein: 6.3 g/dL (ref 6.0–8.3)

## 2014-07-17 LAB — URINALYSIS, ROUTINE W REFLEX MICROSCOPIC
Bilirubin Urine: NEGATIVE
Glucose, UA: 250 mg/dL — AB
Ketones, ur: 15 mg/dL — AB
Leukocytes, UA: NEGATIVE
Nitrite: NEGATIVE
Protein, ur: 100 mg/dL — AB
Specific Gravity, Urine: 1.011 (ref 1.005–1.030)
Urobilinogen, UA: 0.2 mg/dL (ref 0.0–1.0)
pH: 8.5 — ABNORMAL HIGH (ref 5.0–8.0)

## 2014-07-17 LAB — I-STAT CG4 LACTIC ACID, ED: Lactic Acid, Venous: 1.54 mmol/L (ref 0.5–2.2)

## 2014-07-17 LAB — I-STAT TROPONIN, ED: Troponin i, poc: 0.03 ng/mL (ref 0.00–0.08)

## 2014-07-17 LAB — URINE MICROSCOPIC-ADD ON

## 2014-07-17 LAB — LIPASE, BLOOD: Lipase: 48 U/L (ref 11–59)

## 2014-07-17 MED ORDER — HEPARIN SOD (PORK) LOCK FLUSH 100 UNIT/ML IV SOLN
500.0000 [IU] | INTRAVENOUS | Status: AC | PRN
  Administered 2014-07-17: 500 [IU]
  Filled 2014-07-17: qty 5

## 2014-07-17 MED ORDER — IOHEXOL 300 MG/ML  SOLN
25.0000 mL | INTRAMUSCULAR | Status: DC | PRN
  Administered 2014-07-17: 25 mL via ORAL

## 2014-07-17 MED ORDER — ONDANSETRON HCL 4 MG/2ML IJ SOLN
4.0000 mg | Freq: Once | INTRAMUSCULAR | Status: AC
  Administered 2014-07-17: 4 mg via INTRAVENOUS
  Filled 2014-07-17: qty 2

## 2014-07-17 MED ORDER — SODIUM CHLORIDE 0.9 % IV BOLUS (SEPSIS)
1000.0000 mL | Freq: Once | INTRAVENOUS | Status: AC
  Administered 2014-07-17: 1000 mL via INTRAVENOUS

## 2014-07-17 MED ORDER — IOHEXOL 300 MG/ML  SOLN
100.0000 mL | Freq: Once | INTRAMUSCULAR | Status: AC | PRN
  Administered 2014-07-17: 100 mL via INTRAVENOUS

## 2014-07-17 MED ORDER — ONDANSETRON 4 MG PO TBDP: 4.0000 mg | ORAL_TABLET | Freq: Once | ORAL | Status: DC

## 2014-07-17 MED ORDER — FENTANYL CITRATE 0.05 MG/ML IJ SOLN
50.0000 ug | Freq: Once | INTRAMUSCULAR | Status: AC
  Administered 2014-07-17: 50 ug via INTRAVENOUS
  Filled 2014-07-17: qty 2

## 2014-07-17 NOTE — ED Notes (Signed)
Pharmacy notified for need for heaprin for port cath prior to discharge

## 2014-07-17 NOTE — ED Provider Notes (Signed)
CSN: [id number]     Arrival date & time 07/17/14  0257 History   First MD Initiated Contact with Patient 07/17/14 [phone]     Chief Complaint  Patient presents with  . Emesis  . Diarrhea  . Chills     (Consider location/radiation/quality/duration/timing/severity/associated sxs/prior Treatment) HPI Pt is a 73yo female with hx of HTN, anemia of renal disease, CAD, afib, type 2 renal disease, ESRD on dialysis, and large cell (diffuse) non-Hodgkin's lymphoma presenting to ED with c/o sudden onset chills, n/v/d that started this morning around 1am. Pt states she felt well when she went to bed last night.  Pt c/o diffuse abdominal pain that is sharp and cramping in nature, 10/10. Reports 6-7 episodes of vomiting and 2  episodes of diarrhea w/o blood or mucous in stool, PTA. Pt does state she has minimal SOB, pt is wearing a nasal canula in the ED and does not normally wear one at home.  Denies chest pain.   No sick contacts or recent travel.  No medications take PTA.  Abdominal surgical hx significant for appendectomy and hysterectomy.   Pt attends dialysis Tues/Thurs/Sat   Past Medical History  Diagnosis Date  . Hypertension   . Anxiety     h/o anxiety attacks  . Anemia of renal disease   . Claustrophobia   . Panic attacks   . 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 CAD (coronary artery disease)   . Atrial fibrillation   . High cholesterol     hx  . Type 2 diabetes mellitus     "don't take RX now" (11/04/2013)  . Arthritis     "left hip" (11/04/2013)  . ESRD (end stage renal disease) on dialysis 2014    "[employer]; TTS" (11/04/2013)  . 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 Appendectomy  2009  . Cardiac catheterization  2011  . Shoulder open rotator cuff repair Right ?2013  . Esophagogastroduodenoscopy (egd) with propofol  10/06/2012    Procedure: ESOPHAGOGASTRODUODENOSCOPY (EGD) WITH  PROPOFOL;  Surgeon: Arta Silence, MD;  Location: [facility];  Service: Endoscopy;  Laterality: N/A;  . Colonoscopy with propofol  10/06/2012    Procedure: COLONOSCOPY WITH PROPOFOL;  Surgeon: Arta Silence, MD;  Location: [facility];  Service: Endoscopy;  Laterality: N/A;  . Colonoscopy with propofol N/A 01/14/2013    Procedure: COLONOSCOPY WITH PROPOFOL;  Surgeon: Arta Silence, MD;  Location: [facility];  Service: Endoscopy;  Laterality: N/A;  . Portacath placement Right 01/22/2013  . Av fistula placement Left 07/24/2013    Procedure: ARTERIOVENOUS (AV) FISTULA CREATION- LEFT ;  Surgeon: Angelia Mould, MD;  Location: [facility];  Service: Vascular;  Laterality: Left;  . Abdominal hysterectomy  1980's  . Tonsillectomy  1954   Family History  Problem Relation Age of Onset  . Cancer Mother     throat  . Alzheimer's disease Sister   . Cancer Brother     lymphoma  . Cancer Sister     unknown type   History  Substance Use Topics  . Smoking status: Former Smoker -- 0.25 packs/day for 15 years    Types: Cigarettes    Start date: 11/25/1948    Quit date: 04/01/1964  . Smokeless tobacco: Never Used     Comment: quit 59 years ago  . Alcohol Use: No   OB History   Grav Para Term Preterm  Abortions TAB SAB Ect Mult Living                 Review of Systems  Constitutional: Positive for chills. Negative for fever and diaphoresis.  Respiratory: Negative for chest tightness and shortness of breath.   Cardiovascular: Negative for chest pain.  Gastrointestinal: Positive for nausea, vomiting, abdominal pain and diarrhea. Negative for constipation, blood in stool and abdominal distention.  Genitourinary: Negative for dysuria, hematuria and flank pain.  Neurological: Negative for dizziness, weakness and light-headedness.      Allergies  Advicor; Crestor; Lipitor; Lovastatin; Morphine and related; Niaspan; Nsaids; Pravachol; Sulfa antibiotics; Vytorin; Welchol; Zetia; and  Zocor  Home Medications   Prior to Admission medications   Medication Sig Start Date End Date Taking? Authorizing Provider  ALPRAZolam Duanne Moron) 0.5 MG tablet Take 0.5 mg by mouth at bedtime as needed for anxiety or sleep.   Yes Historical Provider, MD  amiodarone (PACERONE) 200 MG tablet Take 200 mg by mouth daily.   Yes Historical Provider, MD  CRESTOR 10 MG tablet Take 10 mg by mouth daily.  05/13/14  Yes Historical Provider, MD  docusate sodium (COLACE) 100 MG capsule Take 100 mg by mouth 2 (two) times daily.   Yes Historical Provider, MD  esomeprazole (NEXIUM) 40 MG capsule Take 40 mg by mouth 2 (two) times daily before a meal.    Yes Historical Provider, MD  famciclovir (FAMVIR) 250 MG tablet Take 250 mg by mouth 3 (three) times a week. Tuesday, Thursday and Saturday   Yes Historical Provider, MD  fentaNYL (DURAGESIC - DOSED MCG/HR) 12 MCG/HR Place 12.5 mcg onto the skin daily as needed (pain).   Yes Historical Provider, MD  fluticasone (FLONASE) 50 MCG/ACT nasal spray Place 2 sprays into both nostrils daily.   Yes Historical Provider, MD  lactulose (CHRONULAC) 10 GM/15ML solution Take 10 g by mouth 2 (two) times daily as needed for mild constipation.   Yes Historical Provider, MD  lidocaine-prilocaine (EMLA) cream Apply 1 application topically as needed (for port-a-cath access).   Yes Historical Provider, MD  metoprolol tartrate (LOPRESSOR) 25 MG tablet Take 1 tablet (25 mg total) by mouth 2 (two) times daily. 11/04/13  Yes Delfina Redwood, MD  olopatadine (PATANOL) 0.1 % ophthalmic solution Place 1 drop into both eyes daily.   Yes Historical Provider, MD  prochlorperazine (COMPAZINE) 10 MG tablet Take 0.5-1 tablets (5-10 mg total) by mouth every 6 (six) hours as needed for nausea or vomiting. 03/08/14  Yes Volanda Napoleon, MD  RENVELA 800 MG tablet Take 2,400 mg by mouth 3 (three) times daily with meals. TAKES 3 TABS WITH EACH MEAL 09/17/13  Yes Historical Provider, MD  traMADol (ULTRAM) 50  MG tablet Take 50 mg by mouth every 6 (six) hours as needed for moderate pain.   Yes Historical Provider, MD   BP 158/55  Pulse 66  Temp(Src) 99.5 F (37.5 C) (Rectal)  Resp 17  SpO2 100% Physical Exam  Nursing note and vitals reviewed. Constitutional:  Elderly female lying in exam bed shaking with chills uncontrollably.  Nasal canula in place. Appears acutely ill.  HENT:  Head: Normocephalic and atraumatic.  Eyes: Conjunctivae are normal. No scleral icterus.  Neck: Normal range of motion.  Cardiovascular: Normal rate, regular rhythm and normal heart sounds.   Regular rate and rhythm  Pulmonary/Chest: Effort normal and breath sounds normal. No respiratory distress. She has no wheezes. She has no rales. She exhibits no tenderness.  No respiratory distress,  able to speak in full sentences w/o difficulty. Lungs: CTAB  Abdominal: Soft. Bowel sounds are normal. She exhibits no distension and no mass. There is tenderness ( mild, diffuse). There is no rebound and no guarding.  Musculoskeletal: Normal range of motion.  Neurological: She is alert.  Skin: Skin is warm.    ED Course  Procedures (including critical care time) Labs Review Labs Reviewed  CBC WITH DIFFERENTIAL - Abnormal; Notable for the following:    WBC 2.5 (*)    RBC 2.71 (*)    Hemoglobin 8.7 (*)    HCT 27.1 (*)    RDW 18.6 (*)    Platelets 148 (*)    Neutrophils Relative % 33 (*)    Lymphocytes Relative 56 (*)    Neutro Abs 0.8 (*)    All other components within normal limits  COMPREHENSIVE METABOLIC PANEL - Abnormal; Notable for the following:    Chloride 95 (*)    Glucose, Bld 202 (*)    BUN 32 (*)    Creatinine, Ser 5.36 (*)    GFR calc non Af Amer 7 (*)    GFR calc Af Amer 8 (*)    Anion gap 21 (*)    All other components within normal limits  URINALYSIS, ROUTINE W REFLEX MICROSCOPIC - Abnormal; Notable for the following:    pH 8.5 (*)    Glucose, UA 250 (*)    Hgb urine dipstick TRACE (*)    Ketones,  ur 15 (*)    Protein, ur 100 (*)    All other components within normal limits  URINE CULTURE  CULTURE, BLOOD (ROUTINE X 2)  CULTURE, BLOOD (ROUTINE X 2)  LIPASE, BLOOD  URINE MICROSCOPIC-ADD ON  I-STAT TROPOININ, ED  I-STAT CG4 LACTIC ACID, ED    Imaging Review Dg Chest 2 View  07/17/2014   CLINICAL DATA:  Fever, chills and diarrhea. History of non-Hodgkin's lymphoma.  EXAM: CHEST  2 VIEW  COMPARISON:  Chest radiograph performed 12/19/2013, and PET/CT performed 03/03/2014  FINDINGS: The lungs are well-aerated. Mild vascular congestion is noted. There is no evidence of focal opacification, pleural effusion or pneumothorax.  The heart is mildly enlarged. No acute osseous abnormalities are seen. A right-sided chest port is noted ending about the mid to distal SVC. Scattered calcification is noted along the proximal abdominal aorta.  IMPRESSION: Mild vascular congestion and mild cardiomegaly noted; lungs remain grossly clear.   Electronically Signed   By: Garald Balding M.D.   On: 07/17/2014 04:11     EKG Interpretation   Date/Time:  Saturday July 17 2014 04:10:20 EDT Ventricular Rate:  68 PR Interval:  242 QRS Duration: 130 QT Interval:  491 QTC Calculation: 522 R Axis:   85 Text Interpretation:  Sinus rhythm Right bundle branch block Confirmed by  NANAVATI, MD, Thelma Comp [phone]) on 07/17/2014 4:37:39 AM      MDM   Final diagnoses:  None    Pt is a 73yo female presenting to ED with c/o sudden onset abdominal pain, associated with n/v/d that started around 1PM, accompanied by severe chills. No recent illness. Vitals: rectal temp of 99.5, widened pulse pressures of 183/47.   On exam, pt appears to be shaking with chills uncontrollably, diffuse abdominal tenderness w/o rebound or guarding.    4:34 AM pt was given zofran and fentanyl.  Pt appears significantly improved compared to when she first arrived in ED.   Labs: mild neutropenia, otherwise unremarkable. CXR: mild vascular  congestion and mild  cardiomegaly noted. Lungs remain grossly clear.   Discussed pt with Dr. Kathrynn Humble who also examined pt. During re-examination, pt was tender in RLQ. Will get CT scan.  6:23 AM pt signed out to [employer], PA-C at shift change. Plan is to f/u on CT abd, disposition appropriately.    Noland Fordyce, PA-C 07/17/14 [phone]

## 2014-07-17 NOTE — Discharge Instructions (Signed)
Please read and follow all provided instructions.  Your diagnoses today include:  1. Nausea vomiting and diarrhea   2. Abdominal pain, unspecified abdominal location     Tests performed today include:  Blood counts and electrolytes  Blood tests to check liver and kidney function  CT scan - shows inflammation of stomach, otherwise nothing appears changed or concerning  Urine test to look for infection  Vital signs. See below for your results today.   Medications prescribed:   None  Take any prescribed medications only as directed.  Home care instructions:   Follow any educational materials contained in this packet.  Follow-up instructions: Please follow-up with your primary care provider in the next 2 days for further evaluation of your symptoms.    Return instructions:  SEEK IMMEDIATE MEDICAL ATTENTION IF:  The pain does not go away or becomes severe   A temperature above 101F develops   Repeated vomiting occurs (multiple episodes)   The pain becomes localized to portions of the abdomen. The right side could possibly be appendicitis. In an adult, the left lower portion of the abdomen could be colitis or diverticulitis.   Blood is being passed in stools or vomit (bright red or black tarry stools)   You develop chest pain, difficulty breathing, dizziness or fainting, or become confused, poorly responsive, or inconsolable (young children)  If you have any other emergent concerns regarding your health  Additional Information: Abdominal (belly) pain can be caused by many things. Your caregiver performed an examination and possibly ordered blood/urine tests and imaging (CT scan, x-rays, ultrasound). Many cases can be observed and treated at home after initial evaluation in the emergency department. Even though you are being discharged home, abdominal pain can be unpredictable. Therefore, you need a repeated exam if your pain does not resolve, returns, or worsens. Most  patients with abdominal pain don't have to be admitted to the hospital or have surgery, but serious problems like appendicitis and gallbladder attacks can start out as nonspecific pain. Many abdominal conditions cannot be diagnosed in one visit, so follow-up evaluations are very important.  Your vital signs today were: BP 157/55   Pulse 66   Temp(Src) 99.5 F (37.5 C) (Rectal)   Resp 17   SpO2 100% If your blood pressure (bp) was elevated above 135/85 this visit, please have this repeated by your doctor within one month. --------------

## 2014-07-17 NOTE — ED Provider Notes (Signed)
Medical screening examination/treatment/procedure(s) were conducted as a shared visit with non-physician practitioner(s) and myself.  I personally evaluated the patient during the encounter.   EKG Interpretation   Date/Time:  Saturday July 17 2014 04:10:20 EDT Ventricular Rate:  68 PR Interval:  242 QRS Duration: 130 QT Interval:  491 QTC Calculation: 522 R Axis:   85 Text Interpretation:  Sinus rhythm Right bundle branch block Confirmed by  Daysy Santini, MD, Khianna Blazina ([id number]) on 07/17/2014 4:37:39 AM      Pt with several comorbidities, including esrd, on hd comes in with sudden onset severe pain, generalized, with nausea and emesis. Pain much improved now. Pt has non peritoneal exam. Given her age, and cancer, dialysis hx, CT scan done, and rules out a lot of infectious and vascular emergencies. If CT neg, po challenge and d/c.  Varney Biles, MD 07/17/14 2316

## 2014-07-17 NOTE — ED Notes (Signed)
Pt. reports nausea ,vomitting and diarrhea with chills onset last night .

## 2014-07-17 NOTE — ED Provider Notes (Signed)
6:13 AM Patient discussed with Sanjuana Kava and Dr. Kathrynn Humble at shift change. Patient with N/V/D and RLQ abd pain, abrupt onset. Pending CT. Can go home if CT neg.   8:04 AM CT reviewed. Findings below. There appears to be some worsening gastritis.   Currently patient is asymptomatic. She tolerated oral contrast without vomiting. It made her stomach hurt somewhat, but this pain is resolved. She is on Nexium twice a day. She thinks that the cube steak she ate last night caused her to have the N/V/D as she does not typically eat this.   Patient states that dialysis she recently had a positive Hemoccult test. We discussed that the CT may not show the cause of this. We discussed that it may be due to gastritis or peptic ulcer disease. Her CT today does not show any infection such as diverticulitis or any large tumors. She will follow up with her primary doctor regarding evaluation of this.  I counseled her to hydrate well today and eat bland foods that she knows will not upset her stomach.  She states that she will have dialysis as scheduled tomorrow.   Patient's abdomen is currently soft and nontender. Normal bowel sounds. Normal lung and heart sounds. Port located in right upper chest.  MDM: suspect gastritis as cause of symptoms. This is a patient with ESRD, history of diabetes. She is slightly hyperglycemic today at 202. She has mild anion gap. She does not have any current symptoms of DKA including persistent vomiting, abdominal pain. She appears very well and I do not suspect DKA as cause of symptoms.       Dg Chest 2 View  07/17/2014   CLINICAL DATA:  Fever, chills and diarrhea. History of non-Hodgkin's lymphoma.  EXAM: CHEST  2 VIEW  COMPARISON:  Chest radiograph performed 12/19/2013, and PET/CT performed 03/03/2014  FINDINGS: The lungs are well-aerated. Mild vascular congestion is noted. There is no evidence of focal opacification, pleural effusion or pneumothorax.  The heart is mildly  enlarged. No acute osseous abnormalities are seen. A right-sided chest port is noted ending about the mid to distal SVC. Scattered calcification is noted along the proximal abdominal aorta.  IMPRESSION: Mild vascular congestion and mild cardiomegaly noted; lungs remain grossly clear.   Electronically Signed   By: Garald Balding M.D.   On: 07/17/2014 04:11   Ct Abdomen Pelvis W Contrast  07/17/2014   CLINICAL DATA:  Diffuse abdominal pain. End-stage renal disease. Dialysis. Non-Hodgkin's lymphoma. Previous hysterectomy.  EXAM: CT ABDOMEN AND PELVIS WITH CONTRAST  TECHNIQUE: Multidetector CT imaging of the abdomen and pelvis was performed using the standard protocol following bolus administration of intravenous contrast.  CONTRAST:  144mL OMNIPAQUE IOHEXOL 300 MG/ML  SOLN  COMPARISON:  CT dated 10/02/2013 and PET-CT dated 03/03/2014.  FINDINGS: Diffuse low-density wall thickening involving the gastric body and antrum. This measures 1.6 cm in thickness on coronal image number 31. Small gallstones in the gallbladder. The largest measures 6 mm in diameter. Minimal pericholecystic fluid as well as periportal edema.  A low density left adrenal mass with peripheral coarse calcifications is again demonstrated. This currently measures 1.6 x 0.9 cm on axial image number 23, representing no significant change since 10/02/2013. Is currently measures 49 Hounsfield units in density.  There is also a low-density area in the lateral aspect of the spleen, measuring 2.7 x 2.4 cm in maximum dimensions and 27 Hounsfield units in density on image number 24. This measured 3.4 x 3.0 cm on 10/02/2013  and 2.4 x 2.3 cm on 03/03/2014.  Multiple bilateral renal cysts are again demonstrated. No intestinal abnormalities or enlarged lymph nodes. The appendix is surgically absent. Linear scarring at the left lung base is unchanged. Atheromatous arterial calcifications. Unremarkable pancreas, right adrenal gland and urinary bladder. Lumbar and  lower thoracic spine degenerative changes.  IMPRESSION: 1. Diffuse low density wall thickening involving the gastric body and antrum with progression since 03/03/2014. This remains most compatible with gastritis. 2. Stable subcapsular low density in the spleen, possibly representing an old area of hemorrhage. 3. Stable probable old left adrenal hemorrhage in calcification. 4. Cholelithiasis. 5. Mild periportal edema and minimal pericholecystic fluid. This can be seen with hepatitis.   Electronically Signed   By: Enrique Sack M.D.   On: 07/17/2014 07:50    Carlisle Cater, PA-C 07/17/14 [place], PA-C 07/17/14 [phone]

## 2014-07-17 NOTE — Progress Notes (Signed)
Patient started oral contrast, upon leaving room, patient's nurse asked about power port.  Advised that as long as it is accessed with power huber needle, it can be used safely for power injection.  Today's CXR shows triangular power port with "CT" imprinted.  Port verified as power injectable.

## 2014-07-20 LAB — PATHOLOGIST SMEAR REVIEW

## 2014-07-23 LAB — CULTURE, BLOOD (ROUTINE X 2)
Culture: NO GROWTH
Culture: NO GROWTH

## 2014-08-10 ENCOUNTER — Other Ambulatory Visit: Payer: Medicare Other | Admitting: Lab

## 2014-08-10 ENCOUNTER — Other Ambulatory Visit: Payer: Self-pay | Admitting: Nurse Practitioner

## 2014-08-10 ENCOUNTER — Ambulatory Visit: Payer: Medicare Other | Admitting: Hematology & Oncology

## 2014-08-10 DIAGNOSIS — D631 Anemia in chronic kidney disease: Secondary | ICD-10-CM

## 2014-08-10 DIAGNOSIS — N189 Chronic kidney disease, unspecified: Secondary | ICD-10-CM

## 2014-08-10 DIAGNOSIS — C8330 Diffuse large B-cell lymphoma, unspecified site: Secondary | ICD-10-CM

## 2014-08-11 ENCOUNTER — Encounter: Payer: Self-pay | Admitting: Hematology & Oncology

## 2014-08-11 ENCOUNTER — Ambulatory Visit (HOSPITAL_BASED_OUTPATIENT_CLINIC_OR_DEPARTMENT_OTHER): Payer: Medicare Other | Admitting: Hematology & Oncology

## 2014-08-11 ENCOUNTER — Telehealth: Payer: Self-pay | Admitting: Hematology & Oncology

## 2014-08-11 ENCOUNTER — Other Ambulatory Visit (HOSPITAL_BASED_OUTPATIENT_CLINIC_OR_DEPARTMENT_OTHER): Payer: Medicare Other | Admitting: Lab

## 2014-08-11 VITALS — BP 175/58 | HR 55 | Temp 98.2°F | Resp 14 | Ht 65.0 in | Wt 126.0 lb

## 2014-08-11 DIAGNOSIS — C8590 Non-Hodgkin lymphoma, unspecified, unspecified site: Secondary | ICD-10-CM

## 2014-08-11 DIAGNOSIS — N189 Chronic kidney disease, unspecified: Secondary | ICD-10-CM

## 2014-08-11 DIAGNOSIS — Z992 Dependence on renal dialysis: Secondary | ICD-10-CM

## 2014-08-11 DIAGNOSIS — C8330 Diffuse large B-cell lymphoma, unspecified site: Secondary | ICD-10-CM

## 2014-08-11 DIAGNOSIS — D631 Anemia in chronic kidney disease: Secondary | ICD-10-CM

## 2014-08-11 LAB — CBC WITH DIFFERENTIAL (CANCER CENTER ONLY)
BASO#: 0 10*3/uL (ref 0.0–0.2)
BASO%: 0.3 % (ref 0.0–2.0)
EOS ABS: 0.1 10*3/uL (ref 0.0–0.5)
EOS%: 4.8 % (ref 0.0–7.0)
HCT: 28.1 % — ABNORMAL LOW (ref 34.8–46.6)
HGB: 8.6 g/dL — ABNORMAL LOW (ref 11.6–15.9)
LYMPH#: 1.9 10*3/uL (ref 0.9–3.3)
LYMPH%: 64.8 % — ABNORMAL HIGH (ref 14.0–48.0)
MCH: 33.2 pg (ref 26.0–34.0)
MCHC: 30.6 g/dL — AB (ref 32.0–36.0)
MCV: 109 fL — ABNORMAL HIGH (ref 81–101)
MONO#: 0.8 10*3/uL (ref 0.1–0.9)
MONO%: 25.9 % — AB (ref 0.0–13.0)
NEUT%: 4.2 % — ABNORMAL LOW (ref 39.6–80.0)
NEUTROS ABS: 0.1 10*3/uL — AB (ref 1.5–6.5)
PLATELETS: 142 10*3/uL — AB (ref 145–400)
RBC: 2.59 10*6/uL — AB (ref 3.70–5.32)
RDW: 15.3 % (ref 11.1–15.7)
WBC: 2.9 10*3/uL — ABNORMAL LOW (ref 3.9–10.0)

## 2014-08-11 LAB — COMPREHENSIVE METABOLIC PANEL
ALT: <8 U/L (ref 0–35)
AST: 12 U/L (ref 0–37)
Albumin: 4 g/dL (ref 3.5–5.2)
Alkaline Phosphatase: 82 U/L (ref 39–117)
BUN: 12 mg/dL (ref 6–23)
CALCIUM: 8.8 mg/dL (ref 8.4–10.5)
CHLORIDE: 99 meq/L (ref 96–112)
CO2: 34 meq/L — AB (ref 19–32)
CREATININE: 4.41 mg/dL — AB (ref 0.50–1.10)
Glucose, Bld: 119 mg/dL — ABNORMAL HIGH (ref 70–99)
Potassium: 4.3 mEq/L (ref 3.5–5.3)
Sodium: 143 mEq/L (ref 135–145)
Total Bilirubin: 0.4 mg/dL (ref 0.2–1.2)
Total Protein: 6 g/dL (ref 6.0–8.3)

## 2014-08-11 LAB — IRON AND TIBC CHCC
%SAT: 29 % (ref 21–57)
IRON: 62 ug/dL (ref 41–142)
TIBC: 215 ug/dL — ABNORMAL LOW (ref 236–444)
UIBC: 153 ug/dL (ref 120–384)

## 2014-08-11 LAB — LACTATE DEHYDROGENASE: LDH: 183 U/L (ref 94–250)

## 2014-08-11 LAB — FERRITIN CHCC: Ferritin: 548 ng/ml — ABNORMAL HIGH (ref 9–269)

## 2014-08-11 NOTE — Telephone Encounter (Signed)
CT biopsy is under review per [facility]

## 2014-08-11 NOTE — Progress Notes (Signed)
Hematology and Oncology Follow Up Visit  [name] [id number] [birth year] 73 y.o. 08/11/2014   Principle Diagnosis:   Recurrent diffuse large cell non-Hodgkin's lymphoma  Chronic renal failure on hemodialysis  Paroxysmal atrial fibrillation  Current Therapy:    Patient is on hemodialysis     Interim History:  [name] is back for followup. Problem now is that [name] white cell count going down. She is asymptomatic with this. She's to [name] dialysis. She's had a problem with infections. She's had no weight loss or weight gain. She's had no abdominal pain. There is no change in bowel or bladder habits. She's had no rashes.  [name] appetite is doing okay.  She's had no bleeding. She's had no nausea or vomiting.  Medications: Current outpatient prescriptions:ALPRAZolam (XANAX) 0.5 MG tablet, Take 0.5 mg by mouth at bedtime as needed for anxiety or sleep., Disp: , Rfl: ;  amiodarone (PACERONE) 200 MG tablet, Take 200 mg by mouth daily., Disp: , Rfl: ;  CRESTOR 10 MG tablet, Take 10 mg by mouth daily. , Disp: , Rfl: ;  diphenhydrAMINE (BENADRYL) 25 MG tablet, Take 25 mg by mouth as needed., Disp: , Rfl:  docusate sodium (COLACE) 100 MG capsule, Take 100 mg by mouth 2 (two) times daily., Disp: , Rfl: ;  esomeprazole (NEXIUM) 40 MG capsule, Take 40 mg by mouth 2 (two) times daily before a meal. , Disp: , Rfl: ;  famciclovir (FAMVIR) 250 MG tablet, Take 250 mg by mouth 3 (three) times a week. Tuesday, Thursday and Saturday, Disp: , Rfl: ;  fentaNYL (DURAGESIC - DOSED MCG/HR) 12 MCG/HR, Place 12.5 mcg onto the skin daily as needed (pain)., Disp: , Rfl:  fluticasone (FLONASE) 50 MCG/ACT nasal spray, Place 2 sprays into both nostrils daily., Disp: , Rfl: ;  lactulose (CHRONULAC) 10 GM/15ML solution, Take 10 g by mouth 2 (two) times daily as needed for mild constipation., Disp: , Rfl: ;  lidocaine-prilocaine (EMLA) cream, Apply 1 application topically as needed (for port-a-cath access)., Disp: , Rfl:   metoprolol tartrate (LOPRESSOR) 25 MG tablet, Take 1 tablet (25 mg total) by mouth 2 (two) times daily., Disp: 60 tablet, Rfl: 0;  olopatadine (PATANOL) 0.1 % ophthalmic solution, Place 1 drop into both eyes daily., Disp: , Rfl: ;  RENVELA 800 MG tablet, Take 2,400 mg by mouth 3 (three) times daily with meals. TAKES 3 TABS WITH EACH MEAL, Disp: , Rfl:  traMADol (ULTRAM) 50 MG tablet, Take 50 mg by mouth every 6 (six) hours as needed for moderate pain., Disp: , Rfl: ;  prochlorperazine (COMPAZINE) 10 MG tablet, Take 0.5-1 tablets (5-10 mg total) by mouth every 6 (six) hours as needed for nausea or vomiting., Disp: 60 tablet, Rfl: 3  Allergies:  Allergies  Allergen Reactions  . Advicor [Niacin-Lovastatin Er]     Muscle cramps  . Crestor [Rosuvastatin]     Muscle cramps  . Lipitor [Atorvastatin]     Muscle cramps  . Lovastatin     Muscle cramps  . Morphine And Related Other (See Comments)    "made me crazy"  . Niaspan [Niacin Er]     Muscle cramps  . Nsaids     Gi side effects  . Pravachol [Pravastatin Sodium]     Gi side effects  . Sulfa Antibiotics Other (See Comments)    Can't remember   . Vytorin [Ezetimibe-Simvastatin] Nausea Only  . Welchol [Colesevelam Hcl]     Muscle cramps  . Zetia [Ezetimibe]  Muscle cramps   . Zocor [Simvastatin]     Muscle cramps     Past Medical History, Surgical history, Social history, and Family History were reviewed and updated.  Review of Systems: As above  Physical Exam:  height is 5\' 5"  (1.651 m) and weight is 126 lb (57.153 kg). [name] oral temperature is 98.2 F (36.8 C). [name] blood pressure is 175/58 and [name] pulse is 55. [name] respiration is 14.   Elderly, petite white female. Head and neck exam shows no ocular or oral lesions. There are no palpable cervical or supraclavicular lymph nodes. Lungs are clear bilaterally. Cardiac exam regular rate and rhythm with no murmurs rubs or bruits. Abdomen is soft. Shows good bowel sounds. There is  no fluid wave. There is no palpable liver or spleen tip. Back exam no tenderness over the spine ribs or hips. Extremities shows the AV fistula in the left overall. She has some slight swelling. There is no pain. There is no erythema. She has decent pulses in the distal extremities. Neurological exam is nonfocal.  Lab Results  Component Value Date   WBC 2.9* 08/11/2014   HGB 8.6* 08/11/2014   HCT 28.1* 08/11/2014   MCV 109* 08/11/2014   PLT 142* 08/11/2014     Chemistry      Component Value Date/Time   NA 141 07/17/2014 0331   NA 139 06/28/2014 1459   K 4.5 07/17/2014 0331   K 3.9 06/28/2014 1459   CL 95* 07/17/2014 0331   CL 94* 06/28/2014 1459   CO2 25 07/17/2014 0331   CO2 30 06/28/2014 1459   BUN 32* 07/17/2014 0331   BUN 25* 06/28/2014 1459   CREATININE 5.36* 07/17/2014 0331   CREATININE 5.8* 06/28/2014 1459      Component Value Date/Time   CALCIUM 8.7 07/17/2014 0331   CALCIUM 8.6 06/28/2014 1459   ALKPHOS 83 07/17/2014 0331   ALKPHOS 66 06/28/2014 1459   AST 18 07/17/2014 0331   AST 17 06/28/2014 1459   ALT 14 07/17/2014 0331   ALT 10 06/28/2014 1459   BILITOT 0.5 07/17/2014 0331   BILITOT 0.50 06/28/2014 1459         Impression and Plan: [name] is 73 year old white female with a history of recurrent non-Hodgkin's lymphoma.   I looked at [name] blood under the microscope. I do not see anything with [name] blood smear to look unusual.  Am not sure as to why [name] white cell count is going down. She certainly could be at risk for myelodysplasia. She is at risk for this because of all the past chemotherapy that she took. She also had some radiation treatments. However, I really don't think this is a issue.  She will need a bone marrow test. I will have to see if radiology can do this next week. I will make sure that the aspirate is sent for flow cytometry.  She will continue [name] hemodialysis.  I spent about 30 minutes with [name] and [name] husband. I showed [name] the labs. I explained to [name]  that that was a problem. I will plan to get [name] back in another month or so.  Marland Kitchen   Volanda Napoleon, MD 10/14/20152:04 PM

## 2014-08-13 ENCOUNTER — Other Ambulatory Visit: Payer: Self-pay

## 2014-08-18 ENCOUNTER — Encounter (HOSPITAL_COMMUNITY): Payer: Self-pay | Admitting: Pharmacy Technician

## 2014-08-20 ENCOUNTER — Other Ambulatory Visit: Payer: Self-pay | Admitting: Radiology

## 2014-08-24 ENCOUNTER — Other Ambulatory Visit: Payer: Self-pay | Admitting: Radiology

## 2014-08-25 ENCOUNTER — Ambulatory Visit (HOSPITAL_COMMUNITY)
Admission: RE | Admit: 2014-08-25 | Discharge: 2014-08-25 | Disposition: A | Discharge status: Home / Self Care | Payer: Medicare Other | Source: Ambulatory Visit | Attending: Hematology & Oncology | Admitting: Hematology & Oncology

## 2014-08-25 ENCOUNTER — Encounter (HOSPITAL_COMMUNITY): Payer: Self-pay

## 2014-08-25 DIAGNOSIS — N186 End stage renal disease: Secondary | ICD-10-CM | POA: Diagnosis not present

## 2014-08-25 DIAGNOSIS — I120 Hypertensive chronic kidney disease with stage 5 chronic kidney disease or end stage renal disease: Secondary | ICD-10-CM | POA: Insufficient documentation

## 2014-08-25 DIAGNOSIS — E780 Pure hypercholesterolemia: Secondary | ICD-10-CM | POA: Diagnosis not present

## 2014-08-25 DIAGNOSIS — Z87891 Personal history of nicotine dependence: Secondary | ICD-10-CM | POA: Diagnosis not present

## 2014-08-25 DIAGNOSIS — D696 Thrombocytopenia, unspecified: Secondary | ICD-10-CM | POA: Diagnosis not present

## 2014-08-25 DIAGNOSIS — E119 Type 2 diabetes mellitus without complications: Secondary | ICD-10-CM | POA: Insufficient documentation

## 2014-08-25 DIAGNOSIS — I2510 Atherosclerotic heart disease of native coronary artery without angina pectoris: Secondary | ICD-10-CM | POA: Diagnosis not present

## 2014-08-25 DIAGNOSIS — C8590 Non-Hodgkin lymphoma, unspecified, unspecified site: Secondary | ICD-10-CM | POA: Diagnosis present

## 2014-08-25 DIAGNOSIS — D539 Nutritional anemia, unspecified: Secondary | ICD-10-CM | POA: Diagnosis not present

## 2014-08-25 DIAGNOSIS — C8330 Diffuse large B-cell lymphoma, unspecified site: Secondary | ICD-10-CM

## 2014-08-25 LAB — CBC WITH DIFFERENTIAL/PLATELET
BASOS ABS: 0 10*3/uL (ref 0.0–0.1)
Basophils Relative: 0 % (ref 0–1)
EOS PCT: 3 % (ref 0–5)
Eosinophils Absolute: 0.2 10*3/uL (ref 0.0–0.7)
HEMATOCRIT: 29.5 % — AB (ref 36.0–46.0)
Hemoglobin: 9.3 g/dL — ABNORMAL LOW (ref 12.0–15.0)
LYMPHS ABS: 1.8 10*3/uL (ref 0.7–4.0)
LYMPHS PCT: 26 % (ref 12–46)
MCH: 33 pg (ref 26.0–34.0)
MCHC: 31.5 g/dL (ref 30.0–36.0)
MCV: 104.6 fL — AB (ref 78.0–100.0)
Monocytes Absolute: 0.8 10*3/uL (ref 0.1–1.0)
Monocytes Relative: 11 % (ref 3–12)
Neutro Abs: 4.2 10*3/uL (ref 1.7–7.7)
Neutrophils Relative %: 60 % (ref 43–77)
Platelets: 97 10*3/uL — ABNORMAL LOW (ref 150–400)
RBC: 2.82 MIL/uL — ABNORMAL LOW (ref 3.87–5.11)
RDW: 15.3 % (ref 11.5–15.5)
WBC: 6.9 10*3/uL (ref 4.0–10.5)

## 2014-08-25 LAB — BASIC METABOLIC PANEL
Anion gap: 15 (ref 5–15)
BUN: 17 mg/dL (ref 6–23)
CALCIUM: 8.8 mg/dL (ref 8.4–10.5)
CO2: 30 meq/L (ref 19–32)
Chloride: 94 mEq/L — ABNORMAL LOW (ref 96–112)
Creatinine, Ser: 4.19 mg/dL — ABNORMAL HIGH (ref 0.50–1.10)
GFR calc Af Amer: 11 mL/min — ABNORMAL LOW (ref >90)
GFR calc non Af Amer: 10 mL/min — ABNORMAL LOW (ref >90)
Glucose, Bld: 121 mg/dL — ABNORMAL HIGH (ref 70–99)
Potassium: 4.2 mEq/L (ref 3.7–5.3)
SODIUM: 139 meq/L (ref 137–147)

## 2014-08-25 LAB — PROTIME-INR
INR: 1.08 (ref 0.00–1.49)
Prothrombin Time: 14.2 seconds (ref 11.6–15.2)

## 2014-08-25 LAB — BONE MARROW EXAM

## 2014-08-25 LAB — APTT: aPTT: 37 seconds (ref 24–37)

## 2014-08-25 MED ORDER — FENTANYL CITRATE 0.05 MG/ML IJ SOLN
INTRAMUSCULAR | Status: AC | PRN
  Administered 2014-08-25: 50 ug via INTRAVENOUS

## 2014-08-25 MED ORDER — MIDAZOLAM HCL 2 MG/2ML IJ SOLN
INTRAMUSCULAR | Status: AC
  Filled 2014-08-25: qty 4

## 2014-08-25 MED ORDER — SODIUM CHLORIDE 0.9 % IV SOLN: INTRAVENOUS | Status: DC

## 2014-08-25 MED ORDER — FENTANYL CITRATE 0.05 MG/ML IJ SOLN
INTRAMUSCULAR | Status: AC
  Filled 2014-08-25: qty 4

## 2014-08-25 MED ORDER — MIDAZOLAM HCL 2 MG/2ML IJ SOLN
INTRAMUSCULAR | Status: AC | PRN
  Administered 2014-08-25: 1 mg via INTRAVENOUS

## 2014-08-25 MED ORDER — HEPARIN SOD (PORK) LOCK FLUSH 100 UNIT/ML IV SOLN
500.0000 [IU] | Freq: Once | INTRAVENOUS | Status: AC
  Administered 2014-08-25: 500 [IU] via INTRAVENOUS
  Filled 2014-08-25 (×2): qty 5

## 2014-08-25 NOTE — Procedures (Signed)
Procedure:  CT guided bone marrow biopsy. Findings:  Aspirate and core biopsy performed of right iliac bone marrow. No complications.

## 2014-08-25 NOTE — H&P (Signed)
Chief Complaint: "I'm having a biopsy"  Referring Physician(s): Ennever,Peter R  History of Present Illness: [name] is a 73 y.o. female with history of recurrent NHL and recently noted low WBC count (although normal today) who presents today for CT guided bone marrow biopsy for further evaluation.  Past Medical History  Diagnosis Date  . Hypertension   . Anxiety     h/o anxiety attacks  . Anemia of renal disease   . Claustrophobia   . Panic attacks   . 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 CAD (coronary artery disease)   . Atrial fibrillation   . High cholesterol     hx  . Type 2 diabetes mellitus     "don't take RX now" (11/04/2013)  . Arthritis     "left hip" (11/04/2013)  . ESRD (end stage renal disease) on dialysis 2014    "[employer]; TTS" (11/04/2013)  . Large cell (diffuse) non-Hodgkin's lymphoma dx'd ?2011    Past Surgical History  Procedure Laterality Date  . Appendectomy  2009  . Cardiac catheterization  2011  . Shoulder open rotator cuff repair Right ?2013  . Esophagogastroduodenoscopy (egd) with propofol  10/06/2012    Procedure: ESOPHAGOGASTRODUODENOSCOPY (EGD) WITH PROPOFOL;  Surgeon: Arta Silence, MD;  Location: [facility];  Service: Endoscopy;  Laterality: N/A;  . Colonoscopy with propofol  10/06/2012    Procedure: COLONOSCOPY WITH PROPOFOL;  Surgeon: Arta Silence, MD;  Location: [facility];  Service: Endoscopy;  Laterality: N/A;  . Colonoscopy with propofol N/A 01/14/2013    Procedure: COLONOSCOPY WITH PROPOFOL;  Surgeon: Arta Silence, MD;  Location: [facility];  Service: Endoscopy;  Laterality: N/A;  . Portacath placement Right 01/22/2013  . Av fistula placement Left 07/24/2013    Procedure: ARTERIOVENOUS (AV) FISTULA CREATION- LEFT ;  Surgeon: Angelia Mould, MD;  Location: [facility];  Service: Vascular;  Laterality: Left;  . Abdominal hysterectomy  1980's  . Tonsillectomy  1954     Allergies: Advicor; Crestor; Lipitor; Lovastatin; Morphine and related; Niaspan; Nsaids; Pravachol; Sulfa antibiotics; Vytorin; Welchol; Zetia; and Zocor  Medications: Prior to Admission medications   Medication Sig Start Date End Date Taking? Authorizing Provider  ALPRAZolam Duanne Moron) 0.5 MG tablet Take 0.5 mg by mouth at bedtime as needed for anxiety or sleep.   Yes Historical Provider, MD  amiodarone (PACERONE) 200 MG tablet Take 200 mg by mouth every morning.    Yes Historical Provider, MD  CRESTOR 10 MG tablet Take 10 mg by mouth at bedtime.  05/13/14  Yes Historical Provider, MD  diphenhydrAMINE (BENADRYL) 25 MG tablet Take 25 mg by mouth every 8 (eight) hours as needed for itching or allergies.    Yes Historical Provider, MD  docusate sodium (COLACE) 100 MG capsule Take 100 mg by mouth 2 (two) times daily.   Yes Historical Provider, MD  esomeprazole (NEXIUM) 40 MG capsule Take 40 mg by mouth 2 (two) times daily before a meal.    Yes Historical Provider, MD  famciclovir (FAMVIR) 250 MG tablet Take 250 mg by mouth 3 (three) times a week. Tuesday, Thursday and Saturday   Yes Historical Provider, MD  fentaNYL (DURAGESIC - DOSED MCG/HR) 12 MCG/HR Place 12.5 mcg onto the skin daily as needed (pain).   Yes Historical Provider, MD  fluticasone (FLONASE) 50 MCG/ACT nasal spray Place 2 sprays into both nostrils every morning.    Yes Historical Provider, MD  lactulose ([place]) 10  GM/15ML solution Take 10 g by mouth 2 (two) times daily as needed for mild constipation.   Yes Historical Provider, MD  lidocaine-prilocaine (EMLA) cream Apply 1 application topically as needed (for port-a-cath access).   Yes Historical Provider, MD  metoprolol tartrate (LOPRESSOR) 25 MG tablet Take 1 tablet (25 mg total) by mouth 2 (two) times daily. 11/04/13  Yes Delfina Redwood, MD  olopatadine (PATANOL) 0.1 % ophthalmic solution Place 1 drop into both eyes every morning.    Yes Historical Provider, MD   prochlorperazine (COMPAZINE) 10 MG tablet Take 0.5-1 tablets (5-10 mg total) by mouth every 6 (six) hours as needed for nausea or vomiting. 03/08/14  Yes Volanda Napoleon, MD  sevelamer carbonate (RENVELA) 800 MG tablet Take 2,400 mg by mouth 3 (three) times daily.   Yes Historical Provider, MD  traMADol (ULTRAM) 50 MG tablet Take 50 mg by mouth every 6 (six) hours as needed for moderate pain.   Yes Historical Provider, MD    Family History  Problem Relation Age of Onset  . Cancer Mother     throat  . Alzheimer's disease Sister   . Cancer Brother     lymphoma  . Cancer Sister     unknown type    History   Social History  . Marital Status: Married    Spouse Name: N/A    Number of Children: N/A  . Years of Education: N/A   Social History Main Topics  . Smoking status: Former Smoker -- 0.25 packs/day for 15 years    Types: Cigarettes    Start date: 11/25/1948    Quit date: 04/01/1964  . Smokeless tobacco: Never Used     Comment: quit 59 years ago  . Alcohol Use: No  . Drug Use: No  . Sexual Activity: Not Currently   Other Topics Concern  . None   Social History Narrative  . None        Review of Systems    Constitutional: Positive for unexpected weight change. Negative for fever and chills.  Respiratory: Negative for cough and shortness of breath.   Cardiovascular: Negative for chest pain.  Gastrointestinal: Positive for nausea. Negative for vomiting, abdominal pain and blood in stool.  Genitourinary: Negative for dysuria and hematuria.  Musculoskeletal: Negative for back pain.       Occ left hip pain  Neurological: Positive for headaches.  Hematological: Bruises/bleeds easily.  Psychiatric/Behavioral: The patient is nervous/anxious.     Vital Signs: BP 179/69  Pulse 53  Temp(Src) 98.2 F (36.8 C) (Oral)  Resp 17  Ht _0  (1.651 m)  Wt 126 lb (57.153 kg)  BMI 20.97 kg/m2  SpO2 99%  Physical Exam  Constitutional: She is oriented to person, place,  and time.  Thin WF in NAD  Cardiovascular: Regular rhythm.   Sl bradycardic; LUE AVF with good thrill/bruit  Pulmonary/Chest: Effort normal and breath sounds normal.  Clean, intact rt chest wall PAC  Abdominal: Soft. Bowel sounds are normal. There is no tenderness.  Musculoskeletal: Normal range of motion.  Trace LE edema  Neurological: She is alert and oriented to person, place, and time.    Imaging: No results found.  Labs:  CBC:  Recent Labs  06/28/14 1459 07/17/14 0331 08/11/14 1200 08/25/14 0740  WBC 4.4 2.5* 2.9* 6.9  HGB 8.4* 8.7* 8.6* 9.3*  HCT 26.0* 27.1* 28.1* 29.5*  PLT 147 148* 142* 97*    COAGS:  Recent Labs  11/04/13 0540 12/19/13 1540 08/25/14  0740  INR 1.05 0.89 1.08  APTT  --   --  37    BMP:  Recent Labs  11/04/13 0540  12/20/13 0330  06/28/14 1459 07/17/14 0331 08/11/14 1200 08/25/14 0740  NA 140  < > 139  < > 139 141 143 139  K 3.9  < > 4.3  < > 3.9 4.5 4.3 4.2  CL 97  < > 97  < > 94* 95* 99 94*  CO2 30  < > 31  < > 30 25 34* 30  GLUCOSE 109*  < > 106*  < > 101 202* 119* 121*  BUN 9  < > 21  < > 25* 32* 12 17  CALCIUM 8.3*  < > 8.4  < > 8.6 8.7 8.8 8.8  CREATININE 3.43*  < > 3.82*  < > 5.8* 5.36* 4.41* 4.19*  GFRNONAA 12*  --  11*  --   --  7*  --  10*  GFRAA 14*  --  13*  --   --  8*  --  11*  < > = values in this interval not displayed.  LIVER FUNCTION TESTS:  Recent Labs  11/04/13 0540  04/19/14 0956 05/31/14 1046 06/28/14 1459 07/17/14 0331 08/11/14 1200  BILITOT 0.3  < > 0.4 0.40 0.50 0.5 0.4  AST 20  < > _0 ALT 9  < > <_1 <8  ALKPHOS 62  < > 70 73 66 83 82  PROT 5.6*  < > 6.0 6.4 6.1* 6.3 6.0  ALBUMIN 2.5*  --  4.0  --   --  3.7 4.0  < > = values in this interval not displayed.  TUMOR MARKERS: No results found for this basename: AFPTM, CEA, CA199, CHROMGRNA,  in the last 8760 hours  Assessment and Plan: DAISI KENTNER is a 73 y.o. female with history of recurrent NHL and recently  noted low WBC count (although normal today) who presents today for CT guided bone marrow biopsy for further evaluation. Details/risks of procedure d/w pt/husband with their understanding and consent.          Signed: Autumn Messing 08/25/2014, 8:26 AM

## 2014-08-25 NOTE — H&P (Signed)
Agree.  For bone marrow biopsy today under CT guidance.  Patient seen.

## 2014-08-25 NOTE — Discharge Instructions (Signed)
Bone Biopsy, Needle, Care After  Call [facility] for pain not relieved by medication, fever, bleeding at biopsy site. Leave dressing in place for 24 hours then remove and bathe as usual. No change in diet or medications.     Read the instructions outlined below and refer to this sheet in the next few weeks. These discharge instructions provide you with general information on caring for yourself after you leave the hospital. Your caregiver may also give you specific instructions. While your treatment has been planned according to the most current medical practices available, unavoidable complications sometimes occur. If you have any problems or questions after discharge, call your caregiver. Finding out the results of your test Not all test results are available during your visit. If your test results are not back during the visit, make an appointment with your caregiver to find out the results. Do not assume everything is normal if you have not heard from your caregiver or the medical facility. It is important for you to follow up on all of your test results.  SEEK MEDICAL CARE IF:   You have redness, swelling, or increasing pain at the site of the biopsy.  You have pus coming from the biopsy site.  You have drainage from the biopsy site lasting longer than 1 day.  You notice a bad smell coming from the biopsy site or dressing.  You develop persistent nausea or vomiting. SEEK IMMEDIATE MEDICAL CARE IF:  You have a fever.  You develop a rash.  You have difficulty breathing.  You develop any reaction or side effects to medicines given. Document Released: 05/04/2005 Document Revised: 08/05/2013 Document Reviewed: 03/22/2009 [facility] Patient Information 2015 [place], [place]. This information is not intended to replace advice given to you by your health care provider. Make sure you discuss any questions you have with your health care provider. Conscious Sedation, Adult, Care  After Refer to this sheet in the next few weeks. These instructions provide you with information on caring for yourself after your procedure. Your health care provider may also give you more specific instructions. Your treatment has been planned according to current medical practices, but problems sometimes occur. Call your health care provider if you have any problems or questions after your procedure. WHAT TO EXPECT AFTER THE PROCEDURE  After your procedure:  You may feel sleepy, clumsy, and have poor balance for several hours.  Vomiting may occur if you eat too soon after the procedure. HOME CARE INSTRUCTIONS  Do not participate in any activities where you could become injured for at least 24 hours. Do not:  Drive.  Swim.  Ride a bicycle.  Operate heavy machinery.  Cook.  Use power tools.  Climb ladders.  Work from a high place.  Do not make important decisions or sign legal documents until you are improved.  If you vomit, drink water, juice, or soup when you can drink without vomiting. Make sure you have little or no nausea before eating solid foods.  Only take over-the-counter or prescription medicines for pain, discomfort, or fever as directed by your health care provider.  Make sure you and your family fully understand everything about the medicines given to you, including what side effects may occur.  You should not drink alcohol, take sleeping pills, or take medicines that cause drowsiness for at least 24 hours.  If you smoke, do not smoke without supervision.  If you are feeling better, you may resume normal activities 24 hours after you were sedated.  Keep all appointments with your health care provider. SEEK MEDICAL CARE IF:  Your skin is pale or bluish in color.  You continue to feel nauseous or vomit.  Your pain is getting worse and is not helped by medicine.  You have bleeding or swelling.  You are still sleepy or feeling clumsy after 24  hours. SEEK IMMEDIATE MEDICAL CARE IF:  You develop a rash.  You have difficulty breathing.  You develop any type of allergic problem.  You have a fever. MAKE SURE YOU:  Understand these instructions.  Will watch your condition.  Will get help right away if you are not doing well or get worse. Document Released: 08/05/2013 Document Reviewed: 08/05/2013 [facility] Patient Information 2015 [place], [place]. This information is not intended to replace advice given to you by your health care provider. Make sure you discuss any questions you have with your health care provider.

## 2014-08-30 ENCOUNTER — Telehealth: Payer: Self-pay | Admitting: *Deleted

## 2014-08-30 NOTE — Telephone Encounter (Signed)
Patient called complaining of muscle weakness, headaches, balance loss with fall x 2 and some nausea. She had a bone marrow done on 08/27/14. Spoke with Dr Marin Olp who doesn't feel like these symptoms are related to her bone marrow. Patient advised to call her primary care physician or to follow up with her physician at dialysis tomorrow. Patient notified and aware.

## 2014-08-31 LAB — CHROMOSOME ANALYSIS, BONE MARROW

## 2014-08-31 LAB — TISSUE HYBRIDIZATION (BONE MARROW)-NCBH

## 2014-09-02 ENCOUNTER — Encounter: Payer: Self-pay | Admitting: Hematology & Oncology

## 2014-09-03 ENCOUNTER — Ambulatory Visit (HOSPITAL_BASED_OUTPATIENT_CLINIC_OR_DEPARTMENT_OTHER): Payer: Medicare Other | Admitting: Hematology & Oncology

## 2014-09-03 ENCOUNTER — Encounter: Payer: Self-pay | Admitting: Hematology & Oncology

## 2014-09-03 ENCOUNTER — Ambulatory Visit (HOSPITAL_COMMUNITY)
Admission: RE | Admit: 2014-09-03 | Discharge: 2014-09-03 | Disposition: A | Discharge status: Home / Self Care | Payer: Medicare Other | Source: Ambulatory Visit | Attending: Hematology & Oncology | Admitting: Hematology & Oncology

## 2014-09-03 ENCOUNTER — Other Ambulatory Visit (HOSPITAL_BASED_OUTPATIENT_CLINIC_OR_DEPARTMENT_OTHER): Payer: Medicare Other | Admitting: Lab

## 2014-09-03 VITALS — BP 169/74 | HR 51 | Temp 98.6°F | Resp 16 | Ht 65.0 in | Wt 123.0 lb

## 2014-09-03 DIAGNOSIS — I519 Heart disease, unspecified: Secondary | ICD-10-CM | POA: Insufficient documentation

## 2014-09-03 DIAGNOSIS — C8590 Non-Hodgkin lymphoma, unspecified, unspecified site: Secondary | ICD-10-CM

## 2014-09-03 DIAGNOSIS — R64 Cachexia: Secondary | ICD-10-CM | POA: Diagnosis not present

## 2014-09-03 DIAGNOSIS — C8330 Diffuse large B-cell lymphoma, unspecified site: Secondary | ICD-10-CM

## 2014-09-03 DIAGNOSIS — Z992 Dependence on renal dialysis: Secondary | ICD-10-CM

## 2014-09-03 DIAGNOSIS — R634 Abnormal weight loss: Secondary | ICD-10-CM

## 2014-09-03 LAB — CMP (CANCER CENTER ONLY)
ALBUMIN: 3.6 g/dL (ref 3.3–5.5)
ALT(SGPT): 13 U/L (ref 10–47)
AST: 18 U/L (ref 11–38)
Alkaline Phosphatase: 54 U/L (ref 26–84)
BUN: 13 mg/dL (ref 7–22)
CHLORIDE: 95 meq/L — AB (ref 98–108)
CO2: 32 mEq/L (ref 18–33)
Calcium: 8.9 mg/dL (ref 8.0–10.3)
Creat: 3.6 mg/dl (ref 0.6–1.2)
GLUCOSE: 129 mg/dL — AB (ref 73–118)
POTASSIUM: 3.3 meq/L (ref 3.3–4.7)
Sodium: 142 mEq/L (ref 128–145)
TOTAL PROTEIN: 6.3 g/dL — AB (ref 6.4–8.1)
Total Bilirubin: 0.5 mg/dl (ref 0.20–1.60)

## 2014-09-03 LAB — CBC WITH DIFFERENTIAL (CANCER CENTER ONLY)
BASO#: 0 10*3/uL (ref 0.0–0.2)
BASO%: 0.2 % (ref 0.0–2.0)
EOS ABS: 0 10*3/uL (ref 0.0–0.5)
EOS%: 1 % (ref 0.0–7.0)
HCT: 30.7 % — ABNORMAL LOW (ref 34.8–46.6)
HEMOGLOBIN: 9.6 g/dL — AB (ref 11.6–15.9)
LYMPH#: 1.3 10*3/uL (ref 0.9–3.3)
LYMPH%: 31.8 % (ref 14.0–48.0)
MCH: 33.7 pg (ref 26.0–34.0)
MCHC: 31.3 g/dL — ABNORMAL LOW (ref 32.0–36.0)
MCV: 108 fL — ABNORMAL HIGH (ref 81–101)
MONO#: 0.6 10*3/uL (ref 0.1–0.9)
MONO%: 13.5 % — ABNORMAL HIGH (ref 0.0–13.0)
NEUT%: 53.5 % (ref 39.6–80.0)
NEUTROS ABS: 2.3 10*3/uL (ref 1.5–6.5)
Platelets: 112 10*3/uL — ABNORMAL LOW (ref 145–400)
RBC: 2.85 10*6/uL — ABNORMAL LOW (ref 3.70–5.32)
RDW: 15.4 % (ref 11.1–15.7)
WBC: 4.2 10*3/uL (ref 3.9–10.0)

## 2014-09-03 LAB — HOLD TUBE, BLOOD BANK - CHCC SATELLITE

## 2014-09-03 MED ORDER — MEGESTROL ACETATE 400 MG/10ML PO SUSP: 800.0000 mg | Freq: Every day | ORAL | Status: DC

## 2014-09-03 NOTE — Progress Notes (Signed)
Panic creatinine of 3.9. Level is lower than patient's baseline. Dr Marin Olp notified.

## 2014-09-03 NOTE — Progress Notes (Signed)
Hematology and Oncology Follow Up Visit  [name] [id number] [birth year] 73 y.o. 09/03/2014   Principle Diagnosis:   Recurrent diffuse large cell non-Hodgkin's lymphoma  Low-grade myelodysplasia  Chronic renal failure on hemodialysis  Paroxysmal atrial fibrillation  Current Therapy:    Patient is on hemodialysis     Interim History:  Ms.  Leckey is back for followup. We did go ahead and do a bone marrow biopsy on her. This is because of her progressive anemia. This was done on October 28. The pathology report ([id number]) shows a hypercellular marrow.  There was some dyspoietic changes.  There is no evidence of lymphoma. The cytogenetics were negative. FISH  Studies were normal.  She has not had a PET scan since May. I think this might be helpful to make sure that she has no lymphoma systemically.  She is losing some weight. Her appetite is down. She's had no nausea or vomiting.  There's been no change with her bowels. With her dialysis, she really does not make much urine.  When she stands, she has a lot of shaking in her legs. She is weak in her legs.  Medications: Current outpatient prescriptions: ALPRAZolam (XANAX) 0.5 MG tablet, Take 0.5 mg by mouth at bedtime as needed for anxiety or sleep., Disp: , Rfl: ;  amiodarone (PACERONE) 200 MG tablet, Take 200 mg by mouth every morning. , Disp: , Rfl: ;  CRESTOR 10 MG tablet, Take 10 mg by mouth at bedtime. , Disp: , Rfl: ;  diphenhydrAMINE (BENADRYL) 25 MG tablet, Take 25 mg by mouth every 8 (eight) hours as needed for itching or allergies. , Disp: , Rfl:  docusate sodium (COLACE) 100 MG capsule, Take 100 mg by mouth 2 (two) times daily., Disp: , Rfl: ;  esomeprazole (NEXIUM) 40 MG capsule, Take 40 mg by mouth 2 (two) times daily before a meal. , Disp: , Rfl: ;  famciclovir (FAMVIR) 250 MG tablet, Take 250 mg by mouth 3 (three) times a week. Tuesday, Thursday and Saturday, Disp: , Rfl: ;  fentaNYL (DURAGESIC - DOSED MCG/HR) 12  MCG/HR, Place 12.5 mcg onto the skin daily as needed (pain)., Disp: , Rfl:  fluticasone (FLONASE) 50 MCG/ACT nasal spray, Place 2 sprays into both nostrils every morning. , Disp: , Rfl: ;  lactulose (CHRONULAC) 10 GM/15ML solution, Take 10 g by mouth 2 (two) times daily as needed for mild constipation., Disp: , Rfl: ;  lidocaine-prilocaine (EMLA) cream, Apply 1 application topically as needed (for port-a-cath access)., Disp: , Rfl:  metoprolol tartrate (LOPRESSOR) 25 MG tablet, Take 1 tablet (25 mg total) by mouth 2 (two) times daily., Disp: 60 tablet, Rfl: 0;  olopatadine (PATANOL) 0.1 % ophthalmic solution, Place 1 drop into both eyes every morning. , Disp: , Rfl: ;  prochlorperazine (COMPAZINE) 10 MG tablet, Take 0.5-1 tablets (5-10 mg total) by mouth every 6 (six) hours as needed for nausea or vomiting., Disp: 60 tablet, Rfl: 3 sevelamer carbonate (RENVELA) 800 MG tablet, Take 2,400 mg by mouth 3 (three) times daily., Disp: , Rfl: ;  traMADol (ULTRAM) 50 MG tablet, Take 50 mg by mouth every 6 (six) hours as needed for moderate pain., Disp: , Rfl: ;  megestrol (MEGACE) 400 MG/10ML suspension, Take 20 mLs (800 mg total) by mouth daily., Disp: 480 mL, Rfl: 3  Allergies:  Allergies  Allergen Reactions  . Advicor [Niacin-Lovastatin Er]     Muscle cramps  . Crestor [Rosuvastatin]     Muscle cramps  . Lipitor [  Atorvastatin]     Muscle cramps  . Lovastatin     Muscle cramps  . Morphine And Related Other (See Comments)    "made me crazy"  . Niaspan [Niacin Er]     Muscle cramps  . Nsaids     Gi side effects  . Pravachol [Pravastatin Sodium]     Gi side effects  . Sulfa Antibiotics Other (See Comments)    Can't remember   . Vytorin [Ezetimibe-Simvastatin] Nausea Only  . Welchol [Colesevelam Hcl]     Muscle cramps  . Zetia [Ezetimibe]     Muscle cramps   . Zocor [Simvastatin]     Muscle cramps     Past Medical History, Surgical history, Social history, and Family History were reviewed  and updated.  Review of Systems: As above  Physical Exam:  height is 5' 5"  (1.651 m) and weight is 123 lb (55.792 kg). Her oral temperature is 98.6 F (37 C). Her blood pressure is 169/74 and her pulse is 51. Her respiration is 16.   Elderly, petite white female. Head and neck exam shows no ocular or oral lesions. There are no palpable cervical or supraclavicular lymph nodes. Lungs are clear bilaterally. Cardiac exam regular rate and rhythm with no murmurs rubs or bruits. Abdomen is soft. Shows good bowel sounds. There is no fluid wave. There is no palpable liver or spleen tip. Back exam no tenderness over the spine ribs or hips. Extremities shows the AV fistula in the left overall. She has some slight swelling. There is no pain. There is no erythema. She has decent pulses in the distal extremities. Neurological exam is nonfocal.  Lab Results  Component Value Date   WBC 4.2 09/03/2014   HGB 9.6* 09/03/2014   HCT 30.7* 09/03/2014   MCV 108* 09/03/2014   PLT 112* 09/03/2014     Chemistry      Component Value Date/Time   NA 142 09/03/2014 1217   NA 139 08/25/2014 0740   K 3.3 09/03/2014 1217   K 4.2 08/25/2014 0740   CL 95* 09/03/2014 1217   CL 94* 08/25/2014 0740   CO2 32 09/03/2014 1217   CO2 30 08/25/2014 0740   BUN 13 09/03/2014 1217   BUN 17 08/25/2014 0740   CREATININE 3.6* 09/03/2014 1217   CREATININE 4.19* 08/25/2014 0740      Component Value Date/Time   CALCIUM 8.9 09/03/2014 1217   CALCIUM 8.8 08/25/2014 0740   ALKPHOS 54 09/03/2014 1217   ALKPHOS 82 08/11/2014 1200   AST 18 09/03/2014 1217   AST 12 08/11/2014 1200   ALT 13 09/03/2014 1217   ALT <8 08/11/2014 1200   BILITOT 0.50 09/03/2014 1217   BILITOT 0.4 08/11/2014 1200         Impression and Plan: Ms. Ovando is 73 year old white female with a history of recurrent non-Hodgkin's lymphoma.   The bone marrow or I could be suggestive of myelodysplasia. She is behaving like this.  Of note, her  erythropoietin level is 182.  I wonder if a blood transition might help her. This might help her legs. I will see about one unit of blood. We will do this on November 9.  I will give her a prescription for Megace elixir. This might help with her appetite.  The PET scan I think will be very important to make sure that there is no recurrent lymphoma. It probably has been over a year since she had active lymphoma.  I spent about 30  minutes with her and her husband. I showed her the labs. I explained to her what the problem was. I will plan to get her back in another month or so.  Marland Kitchen   Volanda Napoleon, MD 11/6/20152:57 PM

## 2014-09-06 ENCOUNTER — Encounter: Payer: Self-pay | Admitting: Hematology & Oncology

## 2014-09-06 ENCOUNTER — Ambulatory Visit (HOSPITAL_BASED_OUTPATIENT_CLINIC_OR_DEPARTMENT_OTHER): Payer: Medicare Other

## 2014-09-06 VITALS — BP 201/67 | HR 50 | Temp 98.0°F | Resp 20

## 2014-09-06 DIAGNOSIS — C8590 Non-Hodgkin lymphoma, unspecified, unspecified site: Secondary | ICD-10-CM

## 2014-09-06 LAB — PREPARE RBC (CROSSMATCH)

## 2014-09-06 MED ORDER — DIPHENHYDRAMINE HCL 25 MG PO CAPS
25.0000 mg | ORAL_CAPSULE | Freq: Once | ORAL | Status: AC
  Administered 2014-09-06: 25 mg via ORAL

## 2014-09-06 MED ORDER — FUROSEMIDE 10 MG/ML IJ SOLN
20.0000 mg | Freq: Once | INTRAMUSCULAR | Status: AC
  Administered 2014-09-06: 20 mg via INTRAVENOUS

## 2014-09-06 MED ORDER — DIPHENHYDRAMINE HCL 25 MG PO CAPS
ORAL_CAPSULE | ORAL | Status: AC
  Filled 2014-09-06: qty 1

## 2014-09-06 MED ORDER — FUROSEMIDE 10 MG/ML IJ SOLN
INTRAMUSCULAR | Status: AC
  Filled 2014-09-06: qty 4

## 2014-09-06 MED ORDER — ACETAMINOPHEN 325 MG PO TABS
650.0000 mg | ORAL_TABLET | Freq: Once | ORAL | Status: AC
  Administered 2014-09-06: 650 mg via ORAL

## 2014-09-06 MED ORDER — SODIUM CHLORIDE 0.9 % IJ SOLN
10.0000 mL | INTRAMUSCULAR | Status: DC | PRN
  Administered 2014-09-06: 10 mL via INTRAVENOUS
  Filled 2014-09-06: qty 10

## 2014-09-06 MED ORDER — SODIUM CHLORIDE 0.9 % IV SOLN
250.0000 mL | Freq: Once | INTRAVENOUS | Status: AC
  Administered 2014-09-06: 250 mL via INTRAVENOUS

## 2014-09-06 MED ORDER — HEPARIN SOD (PORK) LOCK FLUSH 100 UNIT/ML IV SOLN
500.0000 [IU] | Freq: Once | INTRAVENOUS | Status: AC
  Administered 2014-09-06: 500 [IU] via INTRAVENOUS
  Filled 2014-09-06: qty 5

## 2014-09-06 MED ORDER — ACETAMINOPHEN 325 MG PO TABS
ORAL_TABLET | ORAL | Status: AC
  Filled 2014-09-06: qty 2

## 2014-09-06 NOTE — Patient Instructions (Signed)

## 2014-09-07 ENCOUNTER — Observation Stay (HOSPITAL_COMMUNITY): Payer: Medicare Other

## 2014-09-07 ENCOUNTER — Observation Stay (HOSPITAL_COMMUNITY)
Admission: EM | Admit: 2014-09-07 | Discharge: 2014-09-10 | Disposition: A | Discharge status: Home / Self Care | Payer: Medicare Other | Attending: Internal Medicine | Admitting: Internal Medicine

## 2014-09-07 ENCOUNTER — Encounter (HOSPITAL_COMMUNITY): Payer: Self-pay

## 2014-09-07 ENCOUNTER — Emergency Department (HOSPITAL_COMMUNITY): Payer: Medicare Other

## 2014-09-07 ENCOUNTER — Other Ambulatory Visit: Payer: Self-pay

## 2014-09-07 DIAGNOSIS — D631 Anemia in chronic kidney disease: Secondary | ICD-10-CM | POA: Diagnosis not present

## 2014-09-07 DIAGNOSIS — R0602 Shortness of breath: Secondary | ICD-10-CM | POA: Insufficient documentation

## 2014-09-07 DIAGNOSIS — Z8572 Personal history of non-Hodgkin lymphomas: Secondary | ICD-10-CM | POA: Diagnosis not present

## 2014-09-07 DIAGNOSIS — Z7951 Long term (current) use of inhaled steroids: Secondary | ICD-10-CM | POA: Insufficient documentation

## 2014-09-07 DIAGNOSIS — F419 Anxiety disorder, unspecified: Secondary | ICD-10-CM | POA: Insufficient documentation

## 2014-09-07 DIAGNOSIS — Z9889 Other specified postprocedural states: Secondary | ICD-10-CM | POA: Diagnosis not present

## 2014-09-07 DIAGNOSIS — I10 Essential (primary) hypertension: Secondary | ICD-10-CM

## 2014-09-07 DIAGNOSIS — I2510 Atherosclerotic heart disease of native coronary artery without angina pectoris: Secondary | ICD-10-CM | POA: Diagnosis not present

## 2014-09-07 DIAGNOSIS — E119 Type 2 diabetes mellitus without complications: Secondary | ICD-10-CM | POA: Diagnosis not present

## 2014-09-07 DIAGNOSIS — C8330 Diffuse large B-cell lymphoma, unspecified site: Secondary | ICD-10-CM | POA: Diagnosis present

## 2014-09-07 DIAGNOSIS — I4891 Unspecified atrial fibrillation: Secondary | ICD-10-CM | POA: Insufficient documentation

## 2014-09-07 DIAGNOSIS — R079 Chest pain, unspecified: Secondary | ICD-10-CM | POA: Diagnosis present

## 2014-09-07 DIAGNOSIS — R011 Cardiac murmur, unspecified: Secondary | ICD-10-CM | POA: Diagnosis not present

## 2014-09-07 DIAGNOSIS — F40240 Claustrophobia: Secondary | ICD-10-CM | POA: Insufficient documentation

## 2014-09-07 DIAGNOSIS — I120 Hypertensive chronic kidney disease with stage 5 chronic kidney disease or end stage renal disease: Secondary | ICD-10-CM | POA: Insufficient documentation

## 2014-09-07 DIAGNOSIS — Z87891 Personal history of nicotine dependence: Secondary | ICD-10-CM | POA: Diagnosis not present

## 2014-09-07 DIAGNOSIS — M1612 Unilateral primary osteoarthritis, left hip: Secondary | ICD-10-CM | POA: Diagnosis not present

## 2014-09-07 DIAGNOSIS — N186 End stage renal disease: Secondary | ICD-10-CM | POA: Diagnosis not present

## 2014-09-07 DIAGNOSIS — E780 Pure hypercholesterolemia: Secondary | ICD-10-CM | POA: Diagnosis not present

## 2014-09-07 DIAGNOSIS — R0789 Other chest pain: Secondary | ICD-10-CM | POA: Diagnosis not present

## 2014-09-07 DIAGNOSIS — R51 Headache: Secondary | ICD-10-CM | POA: Insufficient documentation

## 2014-09-07 DIAGNOSIS — I509 Heart failure, unspecified: Secondary | ICD-10-CM

## 2014-09-07 DIAGNOSIS — R0600 Dyspnea, unspecified: Secondary | ICD-10-CM | POA: Insufficient documentation

## 2014-09-07 DIAGNOSIS — I482 Chronic atrial fibrillation, unspecified: Secondary | ICD-10-CM | POA: Diagnosis present

## 2014-09-07 DIAGNOSIS — I160 Hypertensive urgency: Secondary | ICD-10-CM | POA: Diagnosis present

## 2014-09-07 DIAGNOSIS — Z992 Dependence on renal dialysis: Secondary | ICD-10-CM

## 2014-09-07 DIAGNOSIS — Z79899 Other long term (current) drug therapy: Secondary | ICD-10-CM | POA: Diagnosis not present

## 2014-09-07 LAB — TYPE AND SCREEN
ABO/RH(D): A POS
Antibody Screen: NEGATIVE
Unit division: 0

## 2014-09-07 LAB — CBC WITH DIFFERENTIAL/PLATELET
BASOS ABS: 0 10*3/uL (ref 0.0–0.1)
BASOS ABS: 0 10*3/uL (ref 0.0–0.1)
Basophils Relative: 0 % (ref 0–1)
Basophils Relative: 0 % (ref 0–1)
Eosinophils Absolute: 0.1 10*3/uL (ref 0.0–0.7)
Eosinophils Absolute: 0 10*3/uL (ref 0.0–0.7)
Eosinophils Relative: 1 % (ref 0–5)
Eosinophils Relative: 0 % (ref 0–5)
HCT: 36.1 % (ref 36.0–46.0)
HCT: 36.4 % (ref 36.0–46.0)
Hemoglobin: 11.8 g/dL — ABNORMAL LOW (ref 12.0–15.0)
Hemoglobin: 11.7 g/dL — ABNORMAL LOW (ref 12.0–15.0)
LYMPHS PCT: 31 % (ref 12–46)
Lymphocytes Relative: 22 % (ref 12–46)
Lymphs Abs: 2.3 10*3/uL (ref 0.7–4.0)
Lymphs Abs: 1.1 10*3/uL (ref 0.7–4.0)
MCH: 32.1 pg (ref 26.0–34.0)
MCH: 32.6 pg (ref 26.0–34.0)
MCHC: 32.7 g/dL (ref 30.0–36.0)
MCHC: 32.1 g/dL (ref 30.0–36.0)
MCV: 98.1 fL (ref 78.0–100.0)
MCV: 101.4 fL — ABNORMAL HIGH (ref 78.0–100.0)
Monocytes Absolute: 1 10*3/uL (ref 0.1–1.0)
Monocytes Absolute: 0.7 10*3/uL (ref 0.1–1.0)
Monocytes Relative: 14 % — ABNORMAL HIGH (ref 3–12)
Monocytes Relative: 13 % — ABNORMAL HIGH (ref 3–12)
NEUTROS ABS: 3.8 10*3/uL (ref 1.7–7.7)
NEUTROS ABS: 3.4 10*3/uL (ref 1.7–7.7)
NEUTROS PCT: 54 % (ref 43–77)
NEUTROS PCT: 65 % (ref 43–77)
PLATELETS: 82 10*3/uL — AB (ref 150–400)
Platelets: 76 10*3/uL — ABNORMAL LOW (ref 150–400)
RBC: 3.68 MIL/uL — ABNORMAL LOW (ref 3.87–5.11)
RBC: 3.59 MIL/uL — ABNORMAL LOW (ref 3.87–5.11)
RDW: 20.3 % — AB (ref 11.5–15.5)
RDW: 20.3 % — AB (ref 11.5–15.5)
WBC: 7.2 10*3/uL (ref 4.0–10.5)
WBC: 5.3 10*3/uL (ref 4.0–10.5)

## 2014-09-07 LAB — COMPREHENSIVE METABOLIC PANEL
ALK PHOS: 67 U/L (ref 39–117)
ALT: 15 U/L (ref 0–35)
ALT: 14 U/L (ref 0–35)
ANION GAP: 21 — AB (ref 5–15)
AST: 20 U/L (ref 0–37)
AST: 16 U/L (ref 0–37)
Albumin: 3.8 g/dL (ref 3.5–5.2)
Albumin: 3.8 g/dL (ref 3.5–5.2)
Alkaline Phosphatase: 72 U/L (ref 39–117)
Anion gap: 21 — ABNORMAL HIGH (ref 5–15)
BILIRUBIN TOTAL: 0.4 mg/dL (ref 0.3–1.2)
BUN: 40 mg/dL — ABNORMAL HIGH (ref 6–23)
BUN: 43 mg/dL — AB (ref 6–23)
CALCIUM: 9.3 mg/dL (ref 8.4–10.5)
CHLORIDE: 95 meq/L — AB (ref 96–112)
CO2: 26 meq/L (ref 19–32)
CO2: 25 mEq/L (ref 19–32)
CREATININE: 6.38 mg/dL — AB (ref 0.50–1.10)
Calcium: 9.1 mg/dL (ref 8.4–10.5)
Chloride: 93 mEq/L — ABNORMAL LOW (ref 96–112)
Creatinine, Ser: 6.28 mg/dL — ABNORMAL HIGH (ref 0.50–1.10)
GFR calc Af Amer: 7 mL/min — ABNORMAL LOW (ref >90)
GFR calc Af Amer: 7 mL/min — ABNORMAL LOW (ref >90)
GFR calc non Af Amer: 6 mL/min — ABNORMAL LOW (ref >90)
GFR calc non Af Amer: 6 mL/min — ABNORMAL LOW (ref >90)
GLUCOSE: 140 mg/dL — AB (ref 70–99)
Glucose, Bld: 164 mg/dL — ABNORMAL HIGH (ref 70–99)
POTASSIUM: 4 meq/L (ref 3.7–5.3)
Potassium: 4.1 mEq/L (ref 3.7–5.3)
SODIUM: 140 meq/L (ref 137–147)
Sodium: 141 mEq/L (ref 137–147)
Total Bilirubin: 0.5 mg/dL (ref 0.3–1.2)
Total Protein: 6.3 g/dL (ref 6.0–8.3)
Total Protein: 6.2 g/dL (ref 6.0–8.3)

## 2014-09-07 LAB — TROPONIN I
Troponin I: <0.3 ng/mL (ref <0.30)
Troponin I: <0.3 ng/mL (ref <0.30)

## 2014-09-07 LAB — MRSA PCR SCREENING: MRSA by PCR: NEGATIVE

## 2014-09-07 LAB — PHOSPHORUS: PHOSPHORUS: 4.7 mg/dL — AB (ref 2.3–4.6)

## 2014-09-07 LAB — PRO B NATRIURETIC PEPTIDE

## 2014-09-07 LAB — TSH: TSH: 6.48 u[IU]/mL — ABNORMAL HIGH (ref 0.350–4.500)

## 2014-09-07 MED ORDER — TRAMADOL HCL 50 MG PO TABS
50.0000 mg | ORAL_TABLET | Freq: Four times a day (QID) | ORAL | Status: DC | PRN
Start: 2014-09-07 — End: 2014-09-10
  Administered 2014-09-07 – 2014-09-09 (×2): 50 mg via ORAL
  Filled 2014-09-07 (×2): qty 1

## 2014-09-07 MED ORDER — OLOPATADINE HCL 0.1 % OP SOLN
1.0000 [drp] | OPHTHALMIC | Status: DC
  Administered 2014-09-07 – 2014-09-10 (×4): 1 [drp] via OPHTHALMIC
  Filled 2014-09-07: qty 5

## 2014-09-07 MED ORDER — LACTULOSE 10 GM/15ML PO SOLN: 10.0000 g | Freq: Two times a day (BID) | ORAL | Status: DC | PRN

## 2014-09-07 MED ORDER — FAMCICLOVIR 500 MG PO TABS
250.0000 mg | ORAL_TABLET | ORAL | Status: DC
  Administered 2014-09-09: 250 mg via ORAL
  Filled 2014-09-07 (×2): qty 0.5

## 2014-09-07 MED ORDER — ACETAMINOPHEN 325 MG PO TABS
ORAL_TABLET | ORAL | Status: AC
  Filled 2014-09-07: qty 2

## 2014-09-07 MED ORDER — LABETALOL HCL 5 MG/ML IV SOLN
20.0000 mg | Freq: Once | INTRAVENOUS | Status: AC
  Administered 2014-09-07: 20 mg via INTRAVENOUS
  Filled 2014-09-07: qty 4

## 2014-09-07 MED ORDER — SODIUM CHLORIDE 0.9 % IJ SOLN
3.0000 mL | Freq: Two times a day (BID) | INTRAMUSCULAR | Status: DC
  Administered 2014-09-08 – 2014-09-10 (×4): 3 mL via INTRAVENOUS

## 2014-09-07 MED ORDER — SEVELAMER CARBONATE 800 MG PO TABS
2400.0000 mg | ORAL_TABLET | Freq: Three times a day (TID) | ORAL | Status: DC
  Administered 2014-09-07 – 2014-09-10 (×9): 2400 mg via ORAL
  Filled 2014-09-07 (×9): qty 3

## 2014-09-07 MED ORDER — PROCHLORPERAZINE MALEATE 5 MG PO TABS
5.0000 mg | ORAL_TABLET | Freq: Four times a day (QID) | ORAL | Status: DC | PRN
  Filled 2014-09-07: qty 2

## 2014-09-07 MED ORDER — LIDOCAINE-PRILOCAINE 2.5-2.5 % EX CREA: 1.0000 "application " | TOPICAL_CREAM | CUTANEOUS | Status: DC | PRN

## 2014-09-07 MED ORDER — DOCUSATE SODIUM 100 MG PO CAPS
100.0000 mg | ORAL_CAPSULE | Freq: Two times a day (BID) | ORAL | Status: DC
  Administered 2014-09-07 – 2014-09-10 (×6): 100 mg via ORAL
  Filled 2014-09-07 (×6): qty 1

## 2014-09-07 MED ORDER — DIPHENHYDRAMINE HCL 25 MG PO TABS
25.0000 mg | ORAL_TABLET | Freq: Three times a day (TID) | ORAL | Status: DC | PRN
  Filled 2014-09-07: qty 1

## 2014-09-07 MED ORDER — ASPIRIN EC 81 MG PO TBEC
81.0000 mg | DELAYED_RELEASE_TABLET | Freq: Every day | ORAL | Status: DC
  Administered 2014-09-07 – 2014-09-10 (×4): 81 mg via ORAL
  Filled 2014-09-07 (×5): qty 1

## 2014-09-07 MED ORDER — ACETAMINOPHEN 500 MG PO TABS
1000.0000 mg | ORAL_TABLET | Freq: Once | ORAL | Status: AC
Start: 2014-09-07 — End: 2014-09-07
  Administered 2014-09-07: 1000 mg via ORAL
  Filled 2014-09-07: qty 2

## 2014-09-07 MED ORDER — MEGESTROL ACETATE 400 MG/10ML PO SUSP
800.0000 mg | Freq: Every day | ORAL | Status: DC
Start: 2014-09-07 — End: 2014-09-10
  Administered 2014-09-08 – 2014-09-10 (×3): 800 mg via ORAL
  Filled 2014-09-07 (×5): qty 20

## 2014-09-07 MED ORDER — AMLODIPINE BESYLATE 5 MG PO TABS
5.0000 mg | ORAL_TABLET | Freq: Every day | ORAL | Status: DC
  Administered 2014-09-07 – 2014-09-10 (×4): 5 mg via ORAL
  Filled 2014-09-07 (×4): qty 1

## 2014-09-07 MED ORDER — AMIODARONE HCL 200 MG PO TABS
200.0000 mg | ORAL_TABLET | ORAL | Status: DC
  Administered 2014-09-07 – 2014-09-10 (×4): 200 mg via ORAL
  Filled 2014-09-07 (×5): qty 1

## 2014-09-07 MED ORDER — ONDANSETRON HCL 4 MG/2ML IJ SOLN: 4.0000 mg | Freq: Four times a day (QID) | INTRAMUSCULAR | Status: DC | PRN

## 2014-09-07 MED ORDER — PANTOPRAZOLE SODIUM 40 MG PO TBEC
40.0000 mg | DELAYED_RELEASE_TABLET | Freq: Every day | ORAL | Status: DC
  Administered 2014-09-07 – 2014-09-10 (×4): 40 mg via ORAL
  Filled 2014-09-07 (×4): qty 1

## 2014-09-07 MED ORDER — FENTANYL 12 MCG/HR TD PT72
12.5000 ug | MEDICATED_PATCH | TRANSDERMAL | Status: DC | PRN
Start: 2014-09-07 — End: 2014-09-10

## 2014-09-07 MED ORDER — LABETALOL HCL 5 MG/ML IV SOLN
10.0000 mg | INTRAVENOUS | Status: DC | PRN
  Administered 2014-09-07 – 2014-09-08 (×4): 10 mg via INTRAVENOUS
  Filled 2014-09-07 (×4): qty 4

## 2014-09-07 MED ORDER — ACETAMINOPHEN 650 MG RE SUPP: 650.0000 mg | Freq: Four times a day (QID) | RECTAL | Status: DC | PRN

## 2014-09-07 MED ORDER — CALCITRIOL 0.5 MCG PO CAPS
0.5000 ug | ORAL_CAPSULE | ORAL | Status: DC
  Administered 2014-09-07 – 2014-09-09 (×2): 0.5 ug via ORAL
  Filled 2014-09-07 (×2): qty 1

## 2014-09-07 MED ORDER — ONDANSETRON HCL 4 MG PO TABS: 4.0000 mg | ORAL_TABLET | Freq: Four times a day (QID) | ORAL | Status: DC | PRN

## 2014-09-07 MED ORDER — FLUTICASONE PROPIONATE 50 MCG/ACT NA SUSP
2.0000 | NASAL | Status: DC
  Administered 2014-09-07 – 2014-09-10 (×4): 2 via NASAL
  Filled 2014-09-07: qty 16

## 2014-09-07 MED ORDER — METOPROLOL TARTRATE 25 MG PO TABS
25.0000 mg | ORAL_TABLET | Freq: Two times a day (BID) | ORAL | Status: DC
  Administered 2014-09-07 – 2014-09-10 (×7): 25 mg via ORAL
  Filled 2014-09-07 (×7): qty 1

## 2014-09-07 MED ORDER — DIPHENHYDRAMINE HCL 25 MG PO CAPS: 25.0000 mg | ORAL_CAPSULE | Freq: Three times a day (TID) | ORAL | Status: DC | PRN

## 2014-09-07 MED ORDER — FAMCICLOVIR 500 MG PO TABS
250.0000 mg | ORAL_TABLET | ORAL | Status: DC
  Filled 2014-09-07: qty 0.5

## 2014-09-07 MED ORDER — SODIUM CHLORIDE 0.9 % IV SOLN
62.5000 mg | INTRAVENOUS | Status: DC
  Administered 2014-09-09: 62.5 mg via INTRAVENOUS
  Filled 2014-09-07 (×2): qty 5

## 2014-09-07 MED ORDER — RENA-VITE PO TABS
1.0000 | ORAL_TABLET | Freq: Every day | ORAL | Status: DC
  Administered 2014-09-07 – 2014-09-09 (×3): 1 via ORAL
  Filled 2014-09-07 (×3): qty 1

## 2014-09-07 MED ORDER — ACETAMINOPHEN 325 MG PO TABS
650.0000 mg | ORAL_TABLET | Freq: Four times a day (QID) | ORAL | Status: DC | PRN
Start: 2014-09-07 — End: 2014-09-10
  Administered 2014-09-07 – 2014-09-09 (×2): 650 mg via ORAL
  Filled 2014-09-07: qty 2

## 2014-09-07 MED ORDER — ROSUVASTATIN CALCIUM 10 MG PO TABS
10.0000 mg | ORAL_TABLET | Freq: Every day | ORAL | Status: DC
  Administered 2014-09-07 – 2014-09-09 (×3): 10 mg via ORAL
  Filled 2014-09-07 (×3): qty 1

## 2014-09-07 MED ORDER — ALPRAZOLAM 0.5 MG PO TABS
0.5000 mg | ORAL_TABLET | Freq: Every evening | ORAL | Status: DC | PRN
  Administered 2014-09-08 – 2014-09-09 (×3): 0.5 mg via ORAL
  Filled 2014-09-07 (×3): qty 1

## 2014-09-07 MED ORDER — SODIUM CHLORIDE 0.9 % IJ SOLN
3.0000 mL | Freq: Two times a day (BID) | INTRAMUSCULAR | Status: DC
  Administered 2014-09-07 – 2014-09-10 (×5): 3 mL via INTRAVENOUS

## 2014-09-07 NOTE — ED Notes (Addendum)
Pt reports she took her Metropolol BP med at 9am yesterday morning (09/06/14) but did not take it last night at 8pm before bed like she was supposed to.

## 2014-09-07 NOTE — Procedures (Signed)
Pt seen on HD. Ap 80 Vp 240  BFR 400.  SBP 198.  Try to pull 3 liters.

## 2014-09-07 NOTE — H&P (Signed)
[facility] History and Physical  [name] HUD:[id number] DOB: 05-Feb-1941 DOA: 09/07/2014  Referring physician: ER physician. PCP: [name], MD   Chief Complaint: Shortness of breath.  HPI: [name] is a 73 y.o. female with history of ESRD on hemodialysis, nonobstructive CAD, hypertension, pancytopenia, history of recurrent non-Hodgkin's lymphoma started experiencing sudden onset of shortness of breath with chest pressure and headache last midnight. Patient states that over the last 1 week she has been noticed to have an elevated blood pressure. In the ER patient was found to have blood pressure more than 858 systolic. Patient was given labetalol IV following which patient depression improved and patient's symptoms improved. Patient also had mild nausea and on exam patient was nonfocal. Chest x-ray shows a retrocardiac shadow but patient does not have any signs of infection to suggest pneumonia. Patient has dialysis on Tuesday Thursday and Saturday patient states she has not missed her dialysis. Cardiac markers are negative.   Review of Systems: As presented in the history of presenting illness, rest negative.  Past Medical History  Diagnosis Date  . Hypertension   . Anxiety     h/o anxiety attacks  . Anemia of renal disease   . Claustrophobia   . Panic attacks   . 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 CAD (coronary artery disease)   . Atrial fibrillation   . High cholesterol     hx  . Type 2 diabetes mellitus     "don't take RX now" (11/04/2013)  . Arthritis     "left hip" (11/04/2013)  . ESRD (end stage renal disease) on dialysis 2014    "[facility]; TTS" (11/04/2013)  . 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 Appendectomy  2009  . Cardiac catheterization  2011  . Shoulder open rotator cuff repair Right ?2013  . Esophagogastroduodenoscopy (egd)  with propofol  10/06/2012    Procedure: ESOPHAGOGASTRODUODENOSCOPY (EGD) WITH PROPOFOL;  Surgeon: Arta Silence, MD;  Location: [facility];  Service: Endoscopy;  Laterality: N/A;  . Colonoscopy with propofol  10/06/2012    Procedure: COLONOSCOPY WITH PROPOFOL;  Surgeon: Arta Silence, MD;  Location: [facility];  Service: Endoscopy;  Laterality: N/A;  . Colonoscopy with propofol N/A 01/14/2013    Procedure: COLONOSCOPY WITH PROPOFOL;  Surgeon: Arta Silence, MD;  Location: [facility];  Service: Endoscopy;  Laterality: N/A;  . Portacath placement Right 01/22/2013  . Av fistula placement Left 07/24/2013    Procedure: ARTERIOVENOUS (AV) FISTULA CREATION- LEFT ;  Surgeon: Angelia Mould, MD;  Location: [facility];  Service: Vascular;  Laterality: Left;  . Abdominal hysterectomy  1980's  . Tonsillectomy  1954   Social History:  reports that she quit smoking about 50 years ago. Her smoking use included Cigarettes. She started smoking about 65 years ago. She has a 3.75 pack-year smoking history. She has never used smokeless tobacco. She reports that she does not drink alcohol or use illicit drugs. Where does patient live home. Can patient participate in ADLs? Yes.  Allergies  Allergen Reactions  . Advicor [Niacin-Lovastatin Er]     Muscle cramps  . Crestor [Rosuvastatin]     Muscle cramps  . Lipitor [Atorvastatin]     Muscle cramps  . Lovastatin     Muscle cramps  . Morphine And Related Other (See Comments)    "made me crazy"  . Niaspan [Niacin Er]  Muscle cramps  . Nsaids     Gi side effects  . Pravachol [Pravastatin Sodium]     Gi side effects  . Sulfa Antibiotics Other (See Comments)    Can't remember   . Vytorin [Ezetimibe-Simvastatin] Nausea Only  . Welchol [Colesevelam Hcl]     Muscle cramps  . Zetia [Ezetimibe]     Muscle cramps   . Zocor [Simvastatin]     Muscle cramps     Family History:  Family History  Problem Relation Age of Onset  . Cancer Mother      throat  . Alzheimer's disease Sister   . Cancer Brother     lymphoma  . Cancer Sister     unknown type      Prior to Admission medications   Medication Sig Start Date End Date Taking? Authorizing Provider  ALPRAZolam Duanne Moron) 0.5 MG tablet Take 0.5 mg by mouth at bedtime as needed for anxiety or sleep.   Yes Historical Provider, MD  amiodarone (PACERONE) 200 MG tablet Take 200 mg by mouth every morning.    Yes Historical Provider, MD  CRESTOR 10 MG tablet Take 10 mg by mouth at bedtime.  05/13/14  Yes Historical Provider, MD  diphenhydrAMINE (BENADRYL) 25 MG tablet Take 25 mg by mouth every 8 (eight) hours as needed for itching or allergies.    Yes Historical Provider, MD  docusate sodium (COLACE) 100 MG capsule Take 100 mg by mouth 2 (two) times daily.   Yes Historical Provider, MD  esomeprazole (NEXIUM) 40 MG capsule Take 40 mg by mouth 2 (two) times daily before a meal.    Yes Historical Provider, MD  famciclovir (FAMVIR) 250 MG tablet Take 250 mg by mouth 3 (three) times a week. Tuesday, Thursday and Saturday   Yes Historical Provider, MD  fentaNYL (DURAGESIC - DOSED MCG/HR) 12 MCG/HR Place 12.5 mcg onto the skin every 3 (three) days as needed (pain).    Yes Historical Provider, MD  fluticasone (FLONASE) 50 MCG/ACT nasal spray Place 2 sprays into both nostrils every morning.    Yes Historical Provider, MD  lactulose (CHRONULAC) 10 GM/15ML solution Take 10 g by mouth 2 (two) times daily as needed for mild constipation.   Yes Historical Provider, MD  lidocaine-prilocaine (EMLA) cream Apply 1 application topically as needed (for port-a-cath access).   Yes Historical Provider, MD  metoprolol tartrate (LOPRESSOR) 25 MG tablet Take 1 tablet (25 mg total) by mouth 2 (two) times daily. 11/04/13  Yes Delfina Redwood, MD  olopatadine (PATANOL) 0.1 % ophthalmic solution Place 1 drop into both eyes every morning.    Yes Historical Provider, MD  prochlorperazine (COMPAZINE) 10 MG tablet Take 0.5-1  tablets (5-10 mg total) by mouth every 6 (six) hours as needed for nausea or vomiting. 03/08/14  Yes Volanda Napoleon, MD  sevelamer carbonate (RENVELA) 800 MG tablet Take 2,400 mg by mouth 3 (three) times daily.   Yes Historical Provider, MD  traMADol (ULTRAM) 50 MG tablet Take 50 mg by mouth every 6 (six) hours as needed for moderate pain.   Yes Historical Provider, MD  megestrol (MEGACE) 400 MG/10ML suspension Take 20 mLs (800 mg total) by mouth daily. 09/03/14   Volanda Napoleon, MD    Physical Exam: Filed Vitals:   09/07/14 0515 09/07/14 0530 09/07/14 0545 09/07/14 0600  BP: 193/68 195/72 193/77 194/75  Pulse: 63 60 60 61  Temp:      TempSrc:      Resp: 16 19  19 21  Height:      Weight:      SpO2: 97% 98% 97% 98%     General:  Well-developed and nourished.  Eyes: anicteric no pallor.  ENT: no discharge from the ears eyes nose mouth.  Neck: no mass felt.  Cardiovascular: S1-S2 heard.  Respiratory: no rhonchi or crepitations.  Abdomen: soft nontender bowel sounds present.  Skin: no rash.  Musculoskeletal: no edema.  Psychiatric: appears normal.  Neurologic: alert awake oriented to time place and person. Moves all extremities.  Labs on Admission:  Basic Metabolic Panel:  Recent Labs Lab 09/03/14 1217 09/07/14 0400  NA 142 140  K 3.3 4.1  CL 95* 93*  CO2 32 26  GLUCOSE 129* 164*  BUN 13 40*  CREATININE 3.6* 6.28*  CALCIUM 8.9 9.3   Liver Function Tests:  Recent Labs Lab 09/03/14 1217 09/07/14 0400  AST 18 20  ALT 13 15  ALKPHOS 54 72  BILITOT 0.50 0.5  PROT 6.3* 6.3  ALBUMIN  --  3.8   No results for input(s): LIPASE, AMYLASE in the last 168 hours. No results for input(s): AMMONIA in the last 168 hours. CBC:  Recent Labs Lab 09/03/14 1217 09/07/14 0400  WBC 4.2 7.2  NEUTROABS 2.3 3.8  HGB 9.6* 11.8*  HCT 30.7* 36.1  MCV 108* 98.1  PLT 112* 82*   Cardiac Enzymes:  Recent Labs Lab 09/07/14 0400  TROPONINI <0.30    BNP (last 3  results)  Recent Labs  11/03/13 1655 09/07/14 0400  PROBNP 6465.0* >70000.0*   CBG: No results for input(s): GLUCAP in the last 168 hours.  Radiological Exams on Admission: Dg Chest Port 1 View  09/07/2014   CLINICAL DATA:  Chest pain, shortness of breath and nausea beginning this morning. History of non-Hodgkin's lymphoma.  EXAM: PORTABLE CHEST - 1 VIEW  COMPARISON:  Chest radiograph July 17, 2014  FINDINGS: The cardiac silhouette appears mildly enlarged, similar. Calcified aortic knob. Retrocardiac consolidation. Similar central pulmonary vascular congestion. No pleural effusions. No pneumothorax.  Mildly widened RIGHT acromioclavicular joint space may be postoperative. Single lumen RIGHT chest Port-A-Cath with distal tip at the caval atrial junction.  IMPRESSION: Retrocardiac consolidation concerning for pneumonia. Recommend followup chest radiograph after treatment to verify improvement.  Stable cardiomegaly and central pulmonary vascular congestion.   Electronically Signed   By: Elon Alas   On: 09/07/2014 04:14    EKG: Independently reviewed. Normal sinus rhythm with RBBB.  Assessment/Plan Principal Problem:   Hypertensive urgency Active Problems:   Large cell (diffuse) non-Hodgkin's lymphoma   ESRD on hemodialysis   Chronic atrial fibrillation   1. Hypertensive emergency - patient's symptoms have improved with labetalol IV and I have placed patient on when necessary IV labetalol and added Norvasc 5 mg by mouth daily for now. Closely follow blood pressure trends particularly after dialysis. Patient is due for dialysis today. Patient states that she was on antihypertensives previously and was off last 1 year. Patient's symptoms most likely are from patient's uncontrolled blood pressure. Since patient also had chest discomfort and history of nonobstructive CAD we will cycle cardiac markers. 2. ESRD on hemodialysis on Tuesday Thursdays and Saturday - please notify  nephrology for dialysis. 3. Pancytopenia with history of recurrent non-Hodgkin's lymphoma - patient has had recent bone marrow biopsy and further recommendations per patient's oncologist. Closely follow CBC. 4. Chronic atrial fibrillation - presently rate controlled and patient was felt not a candidate for anticoagulation secondary to patient's pancytopenia. 5.  Retrocardiac shadow of the chest x-ray - patient does not have any signs or symptoms of pneumonia. May repeat chest x-ray after dialysis and if the shadow versus then made place patient on antibiotics.    Code Status: full code.  Family Communication: patient's husband at the bedside.  Disposition Plan: admit for observation.    Kanyla Omeara N. [facility] Pager [phone].  If 7PM-7AM, please contact night-coverage [email or web address] Password [email or web address] 09/07/2014, 6:54 AM

## 2014-09-07 NOTE — Progress Notes (Addendum)
Pt seen and examined, admitted this am by Dr.Kakrakandy 73/F with ESRD on HD TTS, Recurrent diffuse large cell NHL, PAfib admitted with Dyspnea, hypertensive urgency, CXR with pulm congestion and retrocardiac opacity No symptoms of pneumonia, has been losing weight over the year due to NHL, so i wonder if her HD dry weight needs more adjusting. Suspect Volume related, repeat CXR after HD today Accelerated HTN is also due to volume most likely, should improve with HD, continue amlodipine, lopressor, labetalol PRN Consulted Renal Dr.Mattingly for HD today  Domenic Polite, MD [phone]

## 2014-09-07 NOTE — ED Provider Notes (Signed)
CSN: [id number]     Arrival date & time 09/07/14  8546 History   First MD Initiated Contact with Patient 09/07/14 0402     Chief Complaint  Patient presents with  . Chest Pain     (Consider location/radiation/quality/duration/timing/severity/associated sxs/prior Treatment) HPI 73 year old female presents to the emergency department from home via EMS with complaint of shortness of breath, headache, tightness in her chest.  Symptoms woke her from sleep.  Patient has history of non-Hodgkin's lymphoma, hypertension, end-stage renal disease on dialysis coronary disease hi per cholesteremia and diet controlled diabetes.  She reports over the last week her blood pressures have been elevated.  She reports she was seen by her nephrologist who did not make any changes to her medications. Patient had blood transfusion yesterday after seeing her hematologist/oncologist and noted to have persistent anemia.  Headache is global and severe.  Shortness of breath is better after oxygen.  Patient also reports that she has been having dry heaves over the last week as well.  Past Medical History  Diagnosis Date  . Hypertension   . Anxiety     h/o anxiety attacks  . Anemia of renal disease   . Claustrophobia   . Panic attacks   . 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 CAD (coronary artery disease)   . Atrial fibrillation   . High cholesterol     hx  . Type 2 diabetes mellitus     "don't take RX now" (11/04/2013)  . Arthritis     "left hip" (11/04/2013)  . ESRD (end stage renal disease) on dialysis 2014    "[facility]; TTS" (11/04/2013)  . 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 Appendectomy  2009  . Cardiac catheterization  2011  . Shoulder open rotator cuff repair Right ?2013  . Esophagogastroduodenoscopy (egd) with propofol  10/06/2012    Procedure: ESOPHAGOGASTRODUODENOSCOPY (EGD) WITH PROPOFOL;   Surgeon: Arta Silence, MD;  Location: [facility];  Service: Endoscopy;  Laterality: N/A;  . Colonoscopy with propofol  10/06/2012    Procedure: COLONOSCOPY WITH PROPOFOL;  Surgeon: Arta Silence, MD;  Location: [facility];  Service: Endoscopy;  Laterality: N/A;  . Colonoscopy with propofol N/A 01/14/2013    Procedure: COLONOSCOPY WITH PROPOFOL;  Surgeon: Arta Silence, MD;  Location: [facility];  Service: Endoscopy;  Laterality: N/A;  . Portacath placement Right 01/22/2013  . Av fistula placement Left 07/24/2013    Procedure: ARTERIOVENOUS (AV) FISTULA CREATION- LEFT ;  Surgeon: Angelia Mould, MD;  Location: [place];  Service: Vascular;  Laterality: Left;  . Abdominal hysterectomy  1980's  . Tonsillectomy  1954   Family History  Problem Relation Age of Onset  . Cancer Mother     throat  . Alzheimer's disease Sister   . Cancer Brother     lymphoma  . Cancer Sister     unknown type   History  Substance Use Topics  . Smoking status: Former Smoker -- 0.25 packs/day for 15 years    Types: Cigarettes    Start date: 11/25/1948    Quit date: 04/01/1964  . Smokeless tobacco: Never Used     Comment: quit 59 years ago  . Alcohol Use: No   OB History    No data available     Review of Systems   See History of Present Illness; otherwise all other systems are reviewed and negative  Allergies  Advicor; Crestor; Lipitor; Lovastatin; Morphine and related; Niaspan; Nsaids; Pravachol; Sulfa antibiotics; Vytorin; Welchol; Zetia; and Zocor  Home Medications   Prior to Admission medications   Medication Sig Start Date End Date Taking? Authorizing Provider  ALPRAZolam Duanne Moron) 0.5 MG tablet Take 0.5 mg by mouth at bedtime as needed for anxiety or sleep.   Yes Historical Provider, MD  amiodarone (PACERONE) 200 MG tablet Take 200 mg by mouth every morning.    Yes Historical Provider, MD  CRESTOR 10 MG tablet Take 10 mg by mouth at bedtime.  05/13/14  Yes Historical Provider, MD   diphenhydrAMINE (BENADRYL) 25 MG tablet Take 25 mg by mouth every 8 (eight) hours as needed for itching or allergies.    Yes Historical Provider, MD  docusate sodium (COLACE) 100 MG capsule Take 100 mg by mouth 2 (two) times daily.   Yes Historical Provider, MD  esomeprazole (NEXIUM) 40 MG capsule Take 40 mg by mouth 2 (two) times daily before a meal.    Yes Historical Provider, MD  famciclovir (FAMVIR) 250 MG tablet Take 250 mg by mouth 3 (three) times a week. Tuesday, Thursday and Saturday   Yes Historical Provider, MD  fentaNYL (DURAGESIC - DOSED MCG/HR) 12 MCG/HR Place 12.5 mcg onto the skin every 3 (three) days as needed (pain).    Yes Historical Provider, MD  fluticasone (FLONASE) 50 MCG/ACT nasal spray Place 2 sprays into both nostrils every morning.    Yes Historical Provider, MD  lactulose (CHRONULAC) 10 GM/15ML solution Take 10 g by mouth 2 (two) times daily as needed for mild constipation.   Yes Historical Provider, MD  lidocaine-prilocaine (EMLA) cream Apply 1 application topically as needed (for port-a-cath access).   Yes Historical Provider, MD  metoprolol tartrate (LOPRESSOR) 25 MG tablet Take 1 tablet (25 mg total) by mouth 2 (two) times daily. 11/04/13  Yes Delfina Redwood, MD  olopatadine (PATANOL) 0.1 % ophthalmic solution Place 1 drop into both eyes every morning.    Yes Historical Provider, MD  prochlorperazine (COMPAZINE) 10 MG tablet Take 0.5-1 tablets (5-10 mg total) by mouth every 6 (six) hours as needed for nausea or vomiting. 03/08/14  Yes Volanda Napoleon, MD  sevelamer carbonate (RENVELA) 800 MG tablet Take 2,400 mg by mouth 3 (three) times daily.   Yes Historical Provider, MD  traMADol (ULTRAM) 50 MG tablet Take 50 mg by mouth every 6 (six) hours as needed for moderate pain.   Yes Historical Provider, MD  megestrol (MEGACE) 400 MG/10ML suspension Take 20 mLs (800 mg total) by mouth daily. 09/03/14   Volanda Napoleon, MD   BP 193/68 mmHg  Pulse 63  Temp(Src) 98.3 F (36.8  C) (Oral)  Resp 16  Ht 5\' 2"  (1.575 m)  Wt 123 lb (55.792 kg)  BMI 22.49 kg/m2  SpO2 97% Physical Exam  Constitutional: She is oriented to person, place, and time. She appears well-developed and well-nourished. She appears distressed.  Frail elderly female uncomfortable appearing  HENT:  Head: Normocephalic and atraumatic.  Right Ear: External ear normal.  Left Ear: External ear normal.  Nose: Nose normal.  Mouth/Throat: Oropharynx is clear and moist.  Eyes: Conjunctivae and EOM are normal. Pupils are equal, round, and reactive to light.  Neck: Normal range of motion. Neck supple. No JVD present. No tracheal deviation present. No thyromegaly present.  Cardiovascular: Normal rate, regular rhythm and intact distal pulses.  Exam reveals no gallop and no friction rub.   Murmur heard. Pulmonary/Chest:  Effort normal. No stridor. No respiratory distress. She has no wheezes. She has no rales. She exhibits no tenderness.  Abdominal: Soft. Bowel sounds are normal. She exhibits no distension and no mass. There is no tenderness. There is no rebound and no guarding.  Musculoskeletal: Normal range of motion. She exhibits no edema or tenderness.  Lymphadenopathy:    She has no cervical adenopathy.  Neurological: She is alert and oriented to person, place, and time. She displays normal reflexes. No cranial nerve deficit. She exhibits normal muscle tone. Coordination normal.  Skin: Skin is warm and dry. No rash noted. No erythema. No pallor.  Psychiatric: She has a normal mood and affect. Her behavior is normal. Judgment and thought content normal.  Nursing note and vitals reviewed.   ED Course  Procedures (including critical care time) Labs Review Labs Reviewed  CBC WITH DIFFERENTIAL - Abnormal; Notable for the following:    RBC 3.68 (*)    Hemoglobin 11.8 (*)    RDW 20.3 (*)    Platelets 82 (*)    Monocytes Relative 14 (*)    All other components within normal limits  COMPREHENSIVE  METABOLIC PANEL - Abnormal; Notable for the following:    Chloride 93 (*)    Glucose, Bld 164 (*)    BUN 40 (*)    Creatinine, Ser 6.28 (*)    GFR calc non Af Amer 6 (*)    GFR calc Af Amer 7 (*)    Anion gap 21 (*)    All other components within normal limits  PRO B NATRIURETIC PEPTIDE - Abnormal; Notable for the following:    Pro B Natriuretic peptide (BNP) >70000.0 (*)    All other components within normal limits  TROPONIN I  CBC    Imaging Review Dg Chest Port 1 View  09/07/2014   CLINICAL DATA:  Chest pain, shortness of breath and nausea beginning this morning. History of non-Hodgkin's lymphoma.  EXAM: PORTABLE CHEST - 1 VIEW  COMPARISON:  Chest radiograph July 17, 2014  FINDINGS: The cardiac silhouette appears mildly enlarged, similar. Calcified aortic knob. Retrocardiac consolidation. Similar central pulmonary vascular congestion. No pleural effusions. No pneumothorax.  Mildly widened RIGHT acromioclavicular joint space may be postoperative. Single lumen RIGHT chest Port-A-Cath with distal tip at the caval atrial junction.  IMPRESSION: Retrocardiac consolidation concerning for pneumonia. Recommend followup chest radiograph after treatment to verify improvement.  Stable cardiomegaly and central pulmonary vascular congestion.   Electronically Signed   By: Elon Alas   On: 09/07/2014 04:14     EKG Interpretation None       Date: 09/07/2014  Rate: 69  Rhythm: normal sinus rhythm  QRS Axis: normal  Intervals: normal  ST/T Wave abnormalities: ST depressions inferiorly  Conduction Disutrbances:right bundle branch block  Narrative Interpretation:   Old EKG Reviewed: changes noted    MDM   Final diagnoses:  Hypertensive urgency, malignant  Chest pain, unspecified chest pain type  Dyspnea  ESRD (end stage renal disease) on dialysis    73 year old female with severe hypertension, headache chest pressure and shortness breath.  Concern for hypertensive urgency.   Plan for labetalol, provide Tylenol for headache as well.    Kalman Drape, MD 09/07/14 [phone]

## 2014-09-07 NOTE — ED Notes (Addendum)
PER EMS: pt from home, reports she woke up at 2am with non-radiating substernal CP and nausea. VS: BP-220/120, HR-64 NSR, RR-16, 97% 2L [place], A&OX4. Pt took 324 asa prior to EMS arrival and then EMS arrived and adm 1 nitro with no relief of the CP. Dialysis T/Th/Sat, last dialysis was yesterday (Monday).

## 2014-09-07 NOTE — Consult Note (Signed)
[facility] Renal Consultation Note  Indication for Consultation:  Management of ESRD/hemodialysis; anemia, hypertension/volume and secondary hyperparathyroidism  HPI: [name] is a 73 y.o. female admitted with  Chest Pain/Hypertension Urgency/sob with CE negative so far/ CXR  Showing "Retrocardiac consolidation ". She has not missed HD on TTh Sat schedule ([facility]). Noted not on HD med list only on Metoprolol 65m bid for her Hypertensive meds currently / "prior to HD was on many".  Noted at op  HD hypertensive  pre and post with minimal wt gains/ and noted decr edew last tx and tolerated. Now in room with out CP or SOB.  Past Medical History  Diagnosis Date  . Hypertension   . Anxiety     h/o anxiety attacks  . Anemia of renal disease   . Claustrophobia   . Panic attacks   . 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 CAD (coronary artery disease)   . Atrial fibrillation   . High cholesterol     hx  . Type 2 diabetes mellitus     "don't take RX now" (11/04/2013)  . Arthritis     "left hip" (11/04/2013)  . ESRD (end stage renal disease) on dialysis 2014    "[facility] TTS" (11/04/2013)  . Large cell (diffuse) non-Hodgkin's lymphoma dx'd ?2011    Past Surgical History  Procedure Laterality Date  . Appendectomy  2009  . Cardiac catheterization  2011  . Shoulder open rotator cuff repair Right ?2013  . Esophagogastroduodenoscopy (egd) with propofol  10/06/2012    Procedure: ESOPHAGOGASTRODUODENOSCOPY (EGD) WITH PROPOFOL;  Surgeon: WArta Silence MD;  [facility];  Service: Endoscopy;  Laterality: N/A;  . Colonoscopy with propofol  10/06/2012    Procedure: COLONOSCOPY WITH PROPOFOL;  Surgeon: WArta Silence MD;  Location: [facility];  Service: Endoscopy;  Laterality: N/A;  . Colonoscopy with propofol N/A 01/14/2013    Procedure: COLONOSCOPY WITH PROPOFOL;  Surgeon: WArta Silence MD;  Location: [facility];  Service:  Endoscopy;  Laterality: N/A;  . Portacath placement Right 01/22/2013  . Av fistula placement Left 07/24/2013    Procedure: ARTERIOVENOUS (AV) FISTULA CREATION- LEFT ;  Surgeon: CAngelia Mould MD;  [place]  Service: Vascular;  Laterality: Left;  . Abdominal hysterectomy  1980's  . Tonsillectomy  1954      Family History  Problem Relation Age of Onset  . Cancer Mother     throat  . Alzheimer's disease Sister   . Cancer Brother     lymphoma  . Cancer Sister     unknown type  soc - lives with husband at home and    reports that she quit smoking about 50 years ago. Her smoking use included Cigarettes. She started smoking about 65 years ago. She has a 3.75 pack-year smoking history. She has never used smokeless tobacco. She reports that she does not drink alcohol or use illicit drugs.   Allergies  Allergen Reactions  . Advicor [Niacin-Lovastatin Er]     Muscle cramps  . Crestor [Rosuvastatin]     Muscle cramps  . Lipitor [Atorvastatin]     Muscle cramps  . Lovastatin     Muscle cramps  . Morphine And Related Other (See Comments)    "made me crazy"  . Niaspan [Niacin Er]     Muscle cramps  . Nsaids     Gi side effects  . Pravachol [Pravastatin Sodium]  Gi side effects  . Sulfa Antibiotics Other (See Comments)    Can't remember   . Vytorin [Ezetimibe-Simvastatin] Nausea Only  . Welchol [Colesevelam Hcl]     Muscle cramps  . Zetia [Ezetimibe]     Muscle cramps   . Zocor [Simvastatin]     Muscle cramps     Prior to Admission medications   Medication Sig Start Date End Date Taking? Authorizing Provider  ALPRAZolam Duanne Moron) 0.5 MG tablet Take 0.5 mg by mouth at bedtime as needed for anxiety or sleep.   Yes Historical Provider, MD  amiodarone (PACERONE) 200 MG tablet Take 200 mg by mouth every morning.    Yes Historical Provider, MD  CRESTOR 10 MG tablet Take 10 mg by mouth at bedtime.  05/13/14  Yes Historical Provider, MD  diphenhydrAMINE (BENADRYL) 25  MG tablet Take 25 mg by mouth every 8 (eight) hours as needed for itching or allergies.    Yes Historical Provider, MD  docusate sodium (COLACE) 100 MG capsule Take 100 mg by mouth 2 (two) times daily.   Yes Historical Provider, MD  esomeprazole (NEXIUM) 40 MG capsule Take 40 mg by mouth 2 (two) times daily before a meal.    Yes Historical Provider, MD  famciclovir (FAMVIR) 250 MG tablet Take 250 mg by mouth 3 (three) times a week. Tuesday, Thursday and Saturday   Yes Historical Provider, MD  fentaNYL (DURAGESIC - DOSED MCG/HR) 12 MCG/HR Place 12.5 mcg onto the skin every 3 (three) days as needed (pain).    Yes Historical Provider, MD  fluticasone (FLONASE) 50 MCG/ACT nasal spray Place 2 sprays into both nostrils every morning.    Yes Historical Provider, MD  lactulose (CHRONULAC) 10 GM/15ML solution Take 10 g by mouth 2 (two) times daily as needed for mild constipation.   Yes Historical Provider, MD  lidocaine-prilocaine (EMLA) cream Apply 1 application topically as needed (for port-a-cath access).   Yes Historical Provider, MD  metoprolol tartrate (LOPRESSOR) 25 MG tablet Take 1 tablet (25 mg total) by mouth 2 (two) times daily. 11/04/13  Yes Delfina Redwood, MD  olopatadine (PATANOL) 0.1 % ophthalmic solution Place 1 drop into both eyes every morning.    Yes Historical Provider, MD  prochlorperazine (COMPAZINE) 10 MG tablet Take 0.5-1 tablets (5-10 mg total) by mouth every 6 (six) hours as needed for nausea or vomiting. 03/08/14  Yes Volanda Napoleon, MD  sevelamer carbonate (RENVELA) 800 MG tablet Take 2,400 mg by mouth 3 (three) times daily.   Yes Historical Provider, MD  traMADol (ULTRAM) 50 MG tablet Take 50 mg by mouth every 6 (six) hours as needed for moderate pain.   Yes Historical Provider, MD  megestrol (MEGACE) 400 MG/10ML suspension Take 20 mLs (800 mg total) by mouth daily. 09/03/14   Volanda Napoleon, MD    OFB:PZWCHENIDPOEU **OR** acetaminophen, ALPRAZolam, diphenhydrAMINE, fentaNYL,  labetalol, lactulose, lidocaine-prilocaine, ondansetron **OR** ondansetron (ZOFRAN) IV, prochlorperazine, traMADol  Results for orders placed or performed during the hospital encounter of 09/07/14 (from the past 48 hour(s))  CBC with Differential     Status: Abnormal   Collection Time: 09/07/14  4:00 AM  Result Value Ref Range   WBC 7.2 4.0 - 10.5 K/uL   RBC 3.68 (L) 3.87 - 5.11 MIL/uL   Hemoglobin 11.8 (L) 12.0 - 15.0 g/dL   HCT 36.1 36.0 - 46.0 %   MCV 98.1 78.0 - 100.0 fL   MCH 32.1 26.0 - 34.0 pg   MCHC 32.7 30.0 -  36.0 g/dL   RDW 20.3 (H) 11.5 - 15.5 %   Platelets 82 (L) 150 - 400 K/uL    Comment: PLATELET COUNT CONFIRMED BY SMEAR SPECIMEN CHECKED FOR CLOTS REPEATED TO VERIFY    Neutrophils Relative % 54 43 - 77 %   Neutro Abs 3.8 1.7 - 7.7 K/uL   Lymphocytes Relative 31 12 - 46 %   Lymphs Abs 2.3 0.7 - 4.0 K/uL   Monocytes Relative 14 (H) 3 - 12 %   Monocytes Absolute 1.0 0.1 - 1.0 K/uL   Eosinophils Relative 1 0 - 5 %   Eosinophils Absolute 0.1 0.0 - 0.7 K/uL   Basophils Relative 0 0 - 1 %   Basophils Absolute 0.0 0.0 - 0.1 K/uL  Comprehensive metabolic panel     Status: Abnormal   Collection Time: 09/07/14  4:00 AM  Result Value Ref Range   Sodium 140 137 - 147 mEq/L   Potassium 4.1 3.7 - 5.3 mEq/L   Chloride 93 (L) 96 - 112 mEq/L   CO2 26 19 - 32 mEq/L   Glucose, Bld 164 (H) 70 - 99 mg/dL   BUN 40 (H) 6 - 23 mg/dL   Creatinine, Ser 6.28 (H) 0.50 - 1.10 mg/dL   Calcium 9.3 8.4 - 10.5 mg/dL   Total Protein 6.3 6.0 - 8.3 g/dL   Albumin 3.8 3.5 - 5.2 g/dL   AST 20 0 - 37 U/L   ALT 15 0 - 35 U/L   Alkaline Phosphatase 72 39 - 117 U/L   Total Bilirubin 0.5 0.3 - 1.2 mg/dL   GFR calc non Af Amer 6 (L) >90 mL/min   GFR calc Af Amer 7 (L) >90 mL/min    Comment: (NOTE) The eGFR has been calculated using the CKD EPI equation. This calculation has not been validated in all clinical situations. eGFR's persistently <90 mL/min signify possible Chronic Kidney Disease.     Anion gap 21 (H) 5 - 15  Troponin I     Status: None   Collection Time: 09/07/14  4:00 AM  Result Value Ref Range   Troponin I <0.30 <0.30 ng/mL    Comment:        Due to the release kinetics of cTnI, a negative result within the first hours of the onset of symptoms does not rule out myocardial infarction with certainty. If myocardial infarction is still suspected, repeat the test at appropriate intervals.   Pro b natriuretic peptide (BNP)     Status: Abnormal   Collection Time: 09/07/14  4:00 AM  Result Value Ref Range   Pro B Natriuretic peptide (BNP) >70000.0 (H) 0 - 125 pg/mL  MRSA PCR Screening     Status: None   Collection Time: 09/07/14  7:03 AM  Result Value Ref Range   MRSA by PCR NEGATIVE NEGATIVE    Comment:        The GeneXpert MRSA Assay (FDA approved for NASAL specimens only), is one component of a comprehensive MRSA colonization surveillance program. It is not intended to diagnose MRSA infection nor to guide or monitor treatment for MRSA infections.   Troponin I (q 6hr x 3)     Status: None   Collection Time: 09/07/14  7:38 AM  Result Value Ref Range   Troponin I <0.30 <0.30 ng/mL    Comment:        Due to the release kinetics of cTnI, a negative result within the first hours of the onset of symptoms does  not rule out myocardial infarction with certainty. If myocardial infarction is still suspected, repeat the test at appropriate intervals.   Comprehensive metabolic panel     Status: Abnormal   Collection Time: 09/07/14  7:38 AM  Result Value Ref Range   Sodium 141 137 - 147 mEq/L   Potassium 4.0 3.7 - 5.3 mEq/L   Chloride 95 (L) 96 - 112 mEq/L   CO2 25 19 - 32 mEq/L   Glucose, Bld 140 (H) 70 - 99 mg/dL   BUN 43 (H) 6 - 23 mg/dL   Creatinine, Ser 6.38 (H) 0.50 - 1.10 mg/dL   Calcium 9.1 8.4 - 10.5 mg/dL   Total Protein 6.2 6.0 - 8.3 g/dL   Albumin 3.8 3.5 - 5.2 g/dL   AST 16 0 - 37 U/L   ALT 14 0 - 35 U/L   Alkaline Phosphatase 67 39 - 117  U/L   Total Bilirubin 0.4 0.3 - 1.2 mg/dL   GFR calc non Af Amer 6 (L) >90 mL/min   GFR calc Af Amer 7 (L) >90 mL/min    Comment: (NOTE) The eGFR has been calculated using the CKD EPI equation. This calculation has not been validated in all clinical situations. eGFR's persistently <90 mL/min signify possible Chronic Kidney Disease.    Anion gap 21 (H) 5 - 15  CBC with Differential     Status: Abnormal   Collection Time: 09/07/14  7:38 AM  Result Value Ref Range   WBC 5.3 4.0 - 10.5 K/uL   RBC 3.59 (L) 3.87 - 5.11 MIL/uL   Hemoglobin 11.7 (L) 12.0 - 15.0 g/dL   HCT 36.4 36.0 - 46.0 %   MCV 101.4 (H) 78.0 - 100.0 fL   MCH 32.6 26.0 - 34.0 pg   MCHC 32.1 30.0 - 36.0 g/dL   RDW 20.3 (H) 11.5 - 15.5 %   Platelets 76 (L) 150 - 400 K/uL    Comment: CONSISTENT WITH PREVIOUS RESULT   Neutrophils Relative % 65 43 - 77 %   Neutro Abs 3.4 1.7 - 7.7 K/uL   Lymphocytes Relative 22 12 - 46 %   Lymphs Abs 1.1 0.7 - 4.0 K/uL   Monocytes Relative 13 (H) 3 - 12 %   Monocytes Absolute 0.7 0.1 - 1.0 K/uL   Eosinophils Relative 0 0 - 5 %   Eosinophils Absolute 0.0 0.0 - 0.7 K/uL   Basophils Relative 0 0 - 1 %   Basophils Absolute 0.0 0.0 - 0.1 K/uL  TSH     Status: Abnormal   Collection Time: 09/07/14  7:38 AM  Result Value Ref Range   TSH 6.480 (H) 0.350 - 4.500 uIU/mL    ROS: Co tremor at rest and with motion Dr. Jimmy Footman ordered OP neuro  eval with 09/01/14 hd visit. / Ambulates with walker/ No fevers, sweats or chills, or cough reported  Physical Exam: Filed Vitals:   09/07/14 1005  BP: 191/66  Pulse:   Temp:   Resp: 17     General: Alert/ pleasant eldwerly WF. NAD. OX3 HEENT: [place]. MMM. Nonicteric Neck: no jvd Heart: RRR no mur , rub or gallop/ No tenderness to sternal palpitation Lungs: Crackles Lt base Chest: Rt portacath Abdomen: BS+ , soft, NT, ND Extremities:  No pedal edema  Skin:  No pedal ulcers Neuro: Alert, Ox3. Resting  Hand temor/ moving all extrem , Needs help to  sit up in bed/  Dialysis Access: Pos. Bruit L UA AVF  Dialysis Orders:  Center: [facility]  on TTS . EDW 55.5 HD Bath   Time 4hr Heparin 3500. Access LUAAVF BFR 400 DFR 800     Calcitriol 0.62mg po qhd/ Aranesp 200 qweekly hd  Units IV/HD  Venofer  540mq wkly hd    Assessment/Plan 1. Hypertensive Urgency with chest pain and sob-  CE neg so far/? pna fu cxr after HD as noted per admit team no antibiotics for now/ Challenge edw  On hd again 2. Retrocardiac shadow of the chest x-ray- cxr post hd to help eval. 3. ESRD -  HD TTS (op [facility])  4. Hypertension/volume  -  BP still ^ / will challenge edw on hd slowly in this 73 female/ bp meds 5. Anemia  - Hgb 11.7  Hold esa / continue fe weekly/ fu hgb trend 6. Metabolic bone disease -  Po Calcitriol on HD/  Renvela 3ac / fu ca,phos 7. History of recurrent non-Hodgkin's lymphoma - recent BM BX  - followed by Hem/onc 8. Chronic A. Fib - On amiodarone and Metoprolol - currently on exam SR   Diana HaberPA-C CaK[facility] Beeper [phone]/07/2014, 10:24 AM  I have seen and examined this patient and agree with plan per [name] [id number] admitted for CP and HTN.  Says BP has increased in the past month.  DW has been lowered at HD recently.  Currently no CP.  Will plan HD today and lower DW but suspect she will need antiHTN.  Note addition of amlodipine .  Add PO4 to labs. Daveon Arpino T,MD 09/07/2014 12:09 PM

## 2014-09-08 ENCOUNTER — Encounter (HOSPITAL_COMMUNITY): Payer: Self-pay

## 2014-09-08 ENCOUNTER — Encounter: Payer: Self-pay | Admitting: Hematology & Oncology

## 2014-09-08 DIAGNOSIS — E119 Type 2 diabetes mellitus without complications: Secondary | ICD-10-CM

## 2014-09-08 DIAGNOSIS — C8330 Diffuse large B-cell lymphoma, unspecified site: Secondary | ICD-10-CM

## 2014-09-08 LAB — CBC
HCT: 34.7 % — ABNORMAL LOW (ref 36.0–46.0)
Hemoglobin: 11.4 g/dL — ABNORMAL LOW (ref 12.0–15.0)
MCH: 32.1 pg (ref 26.0–34.0)
MCHC: 32.9 g/dL (ref 30.0–36.0)
MCV: 97.7 fL (ref 78.0–100.0)
Platelets: 84 10*3/uL — ABNORMAL LOW (ref 150–400)
RBC: 3.55 MIL/uL — ABNORMAL LOW (ref 3.87–5.11)
RDW: 19.4 % — AB (ref 11.5–15.5)
WBC: 5.2 10*3/uL (ref 4.0–10.5)

## 2014-09-08 LAB — BASIC METABOLIC PANEL
ANION GAP: 15 (ref 5–15)
BUN: 15 mg/dL (ref 6–23)
CO2: 27 mEq/L (ref 19–32)
Calcium: 8.4 mg/dL (ref 8.4–10.5)
Chloride: 99 mEq/L (ref 96–112)
Creatinine, Ser: 3.16 mg/dL — ABNORMAL HIGH (ref 0.50–1.10)
GFR, EST AFRICAN AMERICAN: 16 mL/min — AB (ref >90)
GFR, EST NON AFRICAN AMERICAN: 14 mL/min — AB (ref >90)
GLUCOSE: 126 mg/dL — AB (ref 70–99)
Potassium: 3.3 mEq/L — ABNORMAL LOW (ref 3.7–5.3)
SODIUM: 141 meq/L (ref 137–147)

## 2014-09-08 LAB — TROPONIN I

## 2014-09-08 MED ORDER — CLONIDINE HCL 0.1 MG PO TABS
0.1000 mg | ORAL_TABLET | ORAL | Status: DC | PRN
  Administered 2014-09-08 – 2014-09-09 (×2): 0.1 mg via ORAL
  Filled 2014-09-08 (×2): qty 1

## 2014-09-08 MED ORDER — POTASSIUM CHLORIDE CRYS ER 20 MEQ PO TBCR
20.0000 meq | EXTENDED_RELEASE_TABLET | Freq: Once | ORAL | Status: AC
  Administered 2014-09-08: 20 meq via ORAL
  Filled 2014-09-08: qty 1

## 2014-09-08 NOTE — Care Management Note (Addendum)
    Page 1 of 2   09/09/2014     2:28:42 PM CARE MANAGEMENT NOTE 09/09/2014  Patient:  [name], [name]   Account Number:  [id number]  Date Initiated:  09/08/2014  Documentation initiated by:  McNally,Kristin  Subjective/Objective Assessment:   pt admit under obs status for htn urgency/mental status change. Hx of HD tu-thur-sa     Action/Plan:   pt from home and would like to return home with [facility]   Anticipated DC Date:  09/10/2014   Anticipated DC Plan:  [place]         Choice offered to / List presented to:  C-4 Adult Children        HH arranged  HH-1 RN  [place].   Status of service:  Completed, signed off Medicare Important Message given?  NO (If response is "NO", the following Medicare IM given date fields will be blank) Date Medicare IM given:   Medicare IM given by:   Date Additional Medicare IM given:   Additional Medicare IM given by:    Discharge Disposition:  [place]  Per UR Regulation:  Reviewed for med. necessity/level of care/duration of stay  If discussed at [place] of Stay Meetings, dates discussed:    Comments:  09-09-14 [address] Jacqlyn Krauss, RN,BSN [phone] CSW did speak with daughter and the plan will be for home with [facility] services. CM did make [facility] aware. No further needs from CM at this time.   09-09-14 Caroline, RN, BSN [phone] Per PT recommendations for SNF. CM did go back in and discuss SNF option with pt. Pt is agreeable to SNF at this time and wanted the CSW to contact Daughter in ref to disposition plan. Daughter undecided about plan of care and if SNF will be best option for pt. PT to touch base with daughter. CM will continue to monitor to see which route pt and family would like to take.  09/08/14 Sharolyn Douglas RN Requested CM cx by pt daughter Davonna Belling regarding home care. This CM met with pt, dtr and spouse.  Per pt and dtr pt states she has fallen 3x recently and dtr states she notices mother is noticibly weaker and with decresed mobility. Pt with hx of some vestibular issues that resolved with prior Epley maneuvers. Choices for [place] given and dtr would like to use [facility] as she has used in the past. Pt and spouse agreeable. Contact information verified and HHC to be set up via the dtr. Call to attending Dr. Hartford Poli and order obtained for PT/OT evaluation. Call to Amy at [place] care and discussed referral and request for [facility] RN, PT/OT and that dtr is contact person as well as given pt's HD days. Daughter also given privated duty list to help assist with pt's husband needs. Pt and husband both declined SNF or AL. Discussed life line and dtr given brochure.

## 2014-09-08 NOTE — Progress Notes (Signed)
S: No new CO but doesn'[name] remember having HD yest O:BP 198/68 mmHg  Pulse 56  Temp(Src) 98.4 F (36.9 C) (Oral)  Resp 18  Ht 5\' 2"  (1.575 m)  Wt 57.425 kg (126 lb 9.6 oz)  BMI 23.15 kg/m2  SpO2 98%  Intake/Output Summary (Last 24 hours) at 09/08/14 0728 Last data filed at 09/07/14 2200  Gross per 24 hour  Intake    120 ml  Output      0 ml  Net    120 ml   Weight change: 0.708 kg (1 lb 9 oz) NTI:RWERX and alert CVS: bradycardic, reg +S4 gallop Resp: Few faint crackles Lt base Abd:+ BS NTND Ext:no edema  LUA AVF + bruit NEURO:CNI Ox1, follows commands, No asterixis   . amiodarone  200 mg Oral BH-q7a  . amLODipine  5 mg Oral Daily  . aspirin EC  81 mg Oral Daily  . calcitRIOL  0.5 mcg Oral Q [name],[name],[name]  . docusate sodium  100 mg Oral BID  . famciclovir  250 mg Oral Once per day on Tue Thu Sat  . [START ON 09/09/2014] ferric gluconate (FERRLECIT/NULECIT) IV  62.5 mg Intravenous Q Thu-HD  . fluticasone  2 spray Each Nare BH-q7a  . megestrol  800 mg Oral Daily  . metoprolol tartrate  25 mg Oral BID  . multivitamin  1 tablet Oral QHS  . olopatadine  1 drop Both Eyes BH-q7a  . pantoprazole  40 mg Oral Daily  . rosuvastatin  10 mg Oral QHS  . sevelamer carbonate  2,400 mg Oral TID WC  . sodium chloride  3 mL Intravenous Q12H  . sodium chloride  3 mL Intravenous Q12H   Dg Chest 2 View  09/07/2014   CLINICAL DATA:  CHF, post hemodialysis  EXAM: CHEST  2 VIEW  COMPARISON:  Earlier exam of 09/07/2014  FINDINGS: RIGHT jugular Port-A-Cath with tip projecting over SVC near cavoatrial junction.  Enlargement of cardiac silhouette with pulmonary vascular congestion.  Atherosclerotic calcification aorta.  Atelectasis versus consolidation in LEFT lower lobe.  Minimal atelectasis at RIGHT base.  Lungs otherwise clear.  No gross pleural effusion or pneumothorax.  Skin fold projects over LEFT chest.  IMPRESSION: Enlargement of cardiac silhouette with pulmonary vascular congestion.  Atelectasis  versus consolidation in LEFT lower lobe with minimal RIGHT basilar atelectasis.   Electronically Signed   By: [name] M.D.   On: 09/07/2014 20:42   Dg Chest Port 1 View  09/07/2014   CLINICAL DATA:  Chest pain, shortness of breath and nausea beginning this morning. History of non-Hodgkin's lymphoma.  EXAM: PORTABLE CHEST - 1 VIEW  COMPARISON:  Chest radiograph July 17, 2014  FINDINGS: The cardiac silhouette appears mildly enlarged, similar. Calcified aortic knob. Retrocardiac consolidation. Similar central pulmonary vascular congestion. No pleural effusions. No pneumothorax.  Mildly widened RIGHT acromioclavicular joint space may be postoperative. Single lumen RIGHT chest Port-A-Cath with distal tip at the caval atrial junction.  IMPRESSION: Retrocardiac consolidation concerning for pneumonia. Recommend followup chest radiograph after treatment to verify improvement.  Stable cardiomegaly and central pulmonary vascular congestion.   Electronically Signed   By: [name]   On: 09/07/2014 04:14   BMET    Component Value Date/Time   NA 141 09/08/2014 0034   NA 142 09/03/2014 1217   K 3.3* 09/08/2014 0034   K 3.3 09/03/2014 1217   CL 99 09/08/2014 0034   CL 95* 09/03/2014 1217   CO2 27 09/08/2014 0034   CO2  32 09/03/2014 1217   GLUCOSE 126* 09/08/2014 0034   GLUCOSE 129* 09/03/2014 1217   BUN 15 09/08/2014 0034   BUN 13 09/03/2014 1217   CREATININE 3.16* 09/08/2014 0034   CREATININE 3.6* 09/03/2014 1217   CALCIUM 8.4 09/08/2014 0034   CALCIUM 8.9 09/03/2014 1217   GFRNONAA 14* 09/08/2014 0034   GFRAA 16* 09/08/2014 0034   CBC    Component Value Date/Time   WBC 5.2 09/08/2014 0034   WBC 4.2 09/03/2014 1217   RBC 3.55* 09/08/2014 0034   RBC 2.85* 09/03/2014 1217   RBC 2.97* 08/04/2013 0918   HGB 11.4* 09/08/2014 0034   HGB 9.6* 09/03/2014 1217   HCT 34.7* 09/08/2014 0034   HCT 30.7* 09/03/2014 1217   PLT 84* 09/08/2014 0034   PLT 112* 09/03/2014 1217   MCV 97.7  09/08/2014 0034   MCV 108* 09/03/2014 1217   MCH 32.1 09/08/2014 0034   MCH 33.7 09/03/2014 1217   MCHC 32.9 09/08/2014 0034   MCHC 31.3* 09/03/2014 1217   RDW 19.4* 09/08/2014 0034   RDW 15.4 09/03/2014 1217   LYMPHSABS 1.1 09/07/2014 0738   LYMPHSABS 1.3 09/03/2014 1217   MONOABS 0.7 09/07/2014 0738   EOSABS 0.0 09/07/2014 0738   EOSABS 0.0 09/03/2014 1217   BASOSABS 0.0 09/07/2014 0738   BASOSABS 0.0 09/03/2014 1217     Assessment: 1. HTN, still running high despite lower DW 2. 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 Anemia   ESA on hold 4. Recurrent non-Hodgkin's lymphoma 5.  Underlying Dementia\ 6. Thrombocytopenia 7.  Mild hypokalemia, would not treat.  Will do HD on higher K bath  Plan: 1.  Will start PRN clonidine for BP control.  Amlodipine just started yest 2.  HD tomorrow and will cont to decrease DW.  BP yest did not decrease at all with HD   [name]

## 2014-09-08 NOTE — Progress Notes (Signed)
UR completed 

## 2014-09-08 NOTE — Progress Notes (Signed)
PROGRESS NOTE  [name] OHY:[id number] DOB: 08-12-1941 DOA: 09/07/2014 PCP: [name], MD  HPI/Subjective: 73 yo female with ESRD on hemodialysis, nonobstructive CAD, hypertension, pancytopenia, history of recurrent non-Hodgkin's lymphoma presented to the ED with complaints of sudden onset shortness of breath, chest pressure, and had last night.  BP in ED was found to be 234/97.  IV labetalol was started and symptoms improved.  Pt reports receiving a blood transfusion yesterday when she went to see her doctor. BP prior to transfusion was 107/74.  Troponin I was negative.  CXR shows retrocardiac shadow but pt has no signs of infection.  Receives dialysis on Tu, [name], and Sa and has not missed dialysis.    Pt reports she is better today but has tremor in hands and legs.          Assessment/Plan:  Hypertensive urgency:  Likely due to transfusion- increased blood volume and ESRD. Symptoms improved with IV labetalol in ED. BP stable throughout night and during the morning.  Last BP 156/83.  Troponin I x 3 normal.  Labetalol discontinued and 0.65m Clonidine q4h PRN started.  Continue Amlodipine, Metoprolol, PRN Clonidine.   ESRD on hemodialysis: Pt on Tu, [name], Sa hemodialysis schedule and has not missed dialysis.  Left arm AV fistula.  Stable.  Cr 3.16, GFR 14.  Continue routine BMPs. Nephrology on board.  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  Recently received bone marrow biopsy.  Being followed by oncology.   Pancytopenia: Chronic problem.  Managed by oncology.  Continue routine CBC.   Chronic atrial fibrillation: Pt not on anticoagulation therapy due to pancytopenia.  Continue Amiodarone.   Diabetes Mellitus, type 2: Chronic problem.  Stable.       Retrocardiac consolidation: Seen on CXR on 11/10.  No signs or symptoms of infection.  Repeat CXR.       DVT Prophylaxis:  SCDs  Code Status: Full Family Communication: Pt is awake and alert.    Disposition Plan: Will return home when medically appropriate.     Consultants:  Nephrology   Procedures:  Hemodialysis  Antibiotics:  None  Objective: Filed Vitals:   09/08/14 0844 09/08/14 0913 09/08/14 0943 09/08/14 1114  BP: 131/87 172/57 162/53 156/83  Pulse:      Temp:    98 F (36.7 C)  TempSrc:    Oral  Resp:      Height:      Weight:      SpO2:    97%    Intake/Output Summary (Last 24 hours) at 09/08/14 1141 Last data filed at 09/08/14 0900  Gross per 24 hour  Intake    240 ml  Output      0 ml  Net    240 ml   Filed Weights   09/07/14 1350 09/07/14 1810 09/08/14 0455  Weight: 56.5 kg (124 lb 9 oz) 53.4 kg (117 lb 11.6 oz) 57.425 kg (126 lb 9.6 oz)    Exam: General: Thin appearing, NAD, appears stated age  H33  EOMI, Anicteic Sclera.  Cardiovascular: RRR, S1 S2 auscultated, no rubs, murmurs or gallops.   Respiratory: Clear to auscultation bilaterally with equal chest rise  Abdomen: Soft, nontender, nondistended, + bowel sounds  Extremities: warm dry without cyanosis clubbing or edema.  Tremulous.   Neuro: AAOx3, cranial nerves grossly intact. Strength 5/5 in upper and lower extremities  Skin: Without rashes exudates or nodules.   Psych: Normal affect and demeanor with intact judgement and insight  Data Reviewed: Basic Metabolic Panel:  Recent Labs Lab 09/03/14 1217 09/07/14 0400 09/07/14 0738 09/07/14 1235 09/08/14 0034  NA 142 140 141  --  141  K 3.3 4.1 4.0  --  3.3*  CL 95* 93* 95*  --  99  CO2 32 26 25  --  27  GLUCOSE 129* 164* 140*  --  126*  BUN 13 40* 43*  --  15  CREATININE 3.6* 6.28* 6.38*  --  3.16*  CALCIUM 8.9 9.3 9.1  --  8.4  PHOS  --   --   --  4.7*  --    Liver Function Tests:  Recent Labs Lab 09/03/14 1217 09/07/14 0400 09/07/14 0738  AST _0 ALT _1 ALKPHOS 54 72 67  BILITOT 0.50 0.5 0.4  PROT 6.3* 6.3 6.2  ALBUMIN  --  3.8 3.8   CBC:  Recent Labs Lab 09/03/14 1217 09/07/14 0400  09/07/14 0738 09/08/14 0034  WBC 4.2 7.2 5.3 5.2  NEUTROABS 2.3 3.8 3.4  --   HGB 9.6* 11.8* 11.7* 11.4*  HCT 30.7* 36.1 36.4 34.7*  MCV 108* 98.1 101.4* 97.7  PLT 112* 82* 76* 84*   Cardiac Enzymes:  Recent Labs Lab 09/07/14 0400 09/07/14 0738 09/07/14 1235 09/08/14 0034  TROPONINI <0.30 <0.30 <0.30 <0.30   BNP (last 3 results)  Recent Labs  11/03/13 1655 09/07/14 0400  PROBNP 6465.0* >70000.0*    Recent Results (from the past 240 hour(s))  MRSA PCR Screening     Status: None   Collection Time: 09/07/14  7:03 AM  Result Value Ref Range Status   MRSA by PCR NEGATIVE NEGATIVE Final    Comment:        The GeneXpert MRSA Assay (FDA approved for NASAL specimens only), is one component of a comprehensive MRSA colonization surveillance program. It is not intended to diagnose MRSA infection nor to guide or monitor treatment for MRSA infections.      Studies: Dg Chest 2 View  09/07/2014   CLINICAL DATA:  CHF, post hemodialysis  EXAM: CHEST  2 VIEW  COMPARISON:  Earlier exam of 09/07/2014  FINDINGS: RIGHT jugular Port-A-Cath with tip projecting over SVC near cavoatrial junction.  Enlargement of cardiac silhouette with pulmonary vascular congestion.  Atherosclerotic calcification aorta.  Atelectasis versus consolidation in LEFT lower lobe.  Minimal atelectasis at RIGHT base.  Lungs otherwise clear.  No gross pleural effusion or pneumothorax.  Skin fold projects over LEFT chest.  IMPRESSION: Enlargement of cardiac silhouette with pulmonary vascular congestion.  Atelectasis versus consolidation in LEFT lower lobe with minimal RIGHT basilar atelectasis.   Electronically Signed   By: Lavonia Dana M.D.   On: 09/07/2014 20:42   Dg Chest Port 1 View  09/07/2014   CLINICAL DATA:  Chest pain, shortness of breath and nausea beginning this morning. History of non-Hodgkin's lymphoma.  EXAM: PORTABLE CHEST - 1 VIEW  COMPARISON:  Chest radiograph July 17, 2014  FINDINGS: The cardiac  silhouette appears mildly enlarged, similar. Calcified aortic knob. Retrocardiac consolidation. Similar central pulmonary vascular congestion. No pleural effusions. No pneumothorax.  Mildly widened RIGHT acromioclavicular joint space may be postoperative. Single lumen RIGHT chest Port-A-Cath with distal tip at the caval atrial junction.  IMPRESSION: Retrocardiac consolidation concerning for pneumonia. Recommend followup chest radiograph after treatment to verify improvement.  Stable cardiomegaly and central pulmonary vascular congestion.   Electronically Signed   By: Elon Alas   On: 09/07/2014 04:14  Scheduled Meds: . amiodarone  200 mg Oral BH-q7a  . amLODipine  5 mg Oral Daily  . aspirin EC  81 mg Oral Daily  . calcitRIOL  0.5 mcg Oral Q T,[name],[name]  . docusate sodium  100 mg Oral BID  . famciclovir  250 mg Oral Once per day on Tue Thu Sat  . [START ON 09/09/2014] ferric gluconate (FERRLECIT/NULECIT) IV  62.5 mg Intravenous Q Thu-HD  . fluticasone  2 spray Each Nare BH-q7a  . megestrol  800 mg Oral Daily  . metoprolol tartrate  25 mg Oral BID  . multivitamin  1 tablet Oral QHS  . olopatadine  1 drop Both Eyes BH-q7a  . pantoprazole  40 mg Oral Daily  . rosuvastatin  10 mg Oral QHS  . sevelamer carbonate  2,400 mg Oral TID WC  . sodium chloride  3 mL Intravenous Q12H  . sodium chloride  3 mL Intravenous Q12H   Continuous Infusions:   Principal Problem:   Hypertensive urgency Active Problems:   Large cell (diffuse) non-Hodgkin's lymphoma   Diabetes mellitus type 2, noninsulin dependent   ESRD on hemodialysis   Chronic atrial fibrillation    Adelene Idler PA-S  [facility] Pager [phone]. If 7PM-7AM, please contact night-coverage at [email or web address], password [email or web address] [facility] 09/08/2014, 11:41 AM  LOS: 1 day     Addendum  Patient seen and examined, chart and data base reviewed.  I agree with the above assessment and plan.  For full details please see Mrs. Adelene Idler PA-S note.  I reviewed and amended the above note as appropriate.   Birdie Hopes, MD [facility] Pager: [phone] 09/08/2014, 1:24 PM

## 2014-09-08 NOTE — Plan of Care (Signed)
Problem: Phase I Progression Outcomes Goal: Pain controlled with appropriate interventions Outcome: Completed/Met Date Met:  09/08/14 Goal: Voiding-avoid urinary catheter unless indicated Outcome: Completed/Met Date Met:  09/08/14  Problem: Phase II Progression Outcomes Goal: IV changed to normal saline lock Outcome: Completed/Met Date Met:  09/08/14     

## 2014-09-09 LAB — BASIC METABOLIC PANEL
Anion gap: 16 — ABNORMAL HIGH (ref 5–15)
BUN: 27 mg/dL — ABNORMAL HIGH (ref 6–23)
CO2: 26 mEq/L (ref 19–32)
CREATININE: 5.55 mg/dL — AB (ref 0.50–1.10)
Calcium: 8.6 mg/dL (ref 8.4–10.5)
Chloride: 99 mEq/L (ref 96–112)
GFR calc Af Amer: 8 mL/min — ABNORMAL LOW (ref >90)
GFR calc non Af Amer: 7 mL/min — ABNORMAL LOW (ref >90)
Glucose, Bld: 134 mg/dL — ABNORMAL HIGH (ref 70–99)
Potassium: 3.6 mEq/L — ABNORMAL LOW (ref 3.7–5.3)
Sodium: 141 mEq/L (ref 137–147)

## 2014-09-09 MED ORDER — BISACODYL 5 MG PO TBEC: 5.0000 mg | DELAYED_RELEASE_TABLET | Freq: Every day | ORAL | Status: DC | PRN

## 2014-09-09 MED ORDER — ACETAMINOPHEN 325 MG PO TABS
ORAL_TABLET | ORAL | Status: AC
  Administered 2014-09-09: 650 mg via ORAL
  Filled 2014-09-09: qty 2

## 2014-09-09 NOTE — Progress Notes (Signed)
CSW (Clinical Education officer, museum) called pt daughter. She notified CSW she did speak with PT. At this time, she does not feel as though her mother will do well in a SNF setting and would like for her to dc home. CSW to notify pt of conversation after dialysis. CSW notified RNCM. At this time, pt has no further hospital social work needs. CSW signing off.  [place], [place]

## 2014-09-09 NOTE — Clinical Social Work Psychosocial (Signed)
     Clinical Social Work Department BRIEF PSYCHOSOCIAL ASSESSMENT 09/09/2014  Patient:  [name], [name]     Account Number:  [id number]     Admit date:  09/07/2014  Clinical Social Worker:  Adair Laundry  Date/Time:  09/09/2014 11:26 AM  Referred by:  Physician  Date Referred:  09/09/2014 Referred for  SNF Placement   Other Referral:   Interview type:  Patient Other interview type:   Spoke with pt at bedside and pt daughter over the phone    PSYCHOSOCIAL DATA Living Status:  [place] Admitted from facility:   Level of care:   Primary support name:  Angie Cockman Primary support relationship to patient:  CHILD, ADULT Degree of support available:   Pt has strong support    CURRENT CONCERNS Current Concerns  Post-Acute Placement   Other Concerns:    SOCIAL WORK ASSESSMENT / PLAN CSW made aware that PT recommendation will be for SNF. CSW visited pt room and spoke with pt. Pt confused as she believed the plan was for her to get home health therpay. CSW notified that if pt is agreeable plan can be changed and recommendation has changed. Pt informed CSW that she would do what her daughter thinks is best. CSW called pt daughter and notified that PT recommendation is for SNF. Pt daughter expressed hesitation towards having pt dc to SNF. She informed CSW that she feels pt will not do well with this plan. She would like to think through dc plan further and call CSW back. CSW asked if referral could be sent out and explained this would not committ familyto this plan. Pt daughter not agreeable at this time. She informed CSW she does not like the idea of SNF and it would be helpful to hear from PT on why this is the recommendation. CSW notified her that [place] would page PT and ask for them to please call her and explain how pt session today went and reason for recommendation. Pt daughter to call CSW back with decision. CSW paged PT.   Assessment/plan status:  Psychosocial  Support/Ongoing Assessment of Needs Other assessment/ plan:   Information/referral to community resources:   SNF list denied at this time    PATIENTS/FAMILYS RESPONSE TO PLAN OF CARE: Pt and pt daughter unsure about dc plan. They both expressed hesitation towards SNF. Pt daughter wanting further explanation on recommendation.    [place], [place]

## 2014-09-09 NOTE — Evaluation (Signed)
Physical Therapy Evaluation Patient Details Name: [name] MRN: [id number] DOB: Aug 31, 1941 Today's Date: 09/09/2014   History of Present Illness    73 yo female with ESRD on hemodialysis, nonobstructive CAD, hypertension, pancytopenia, history of recurrent non-Hodgkin's lymphoma presented to the ED with complaints of sudden onset shortness of breath, chest pressure, and had last night. BP in ED was found to be 234/97. IV labetalol was started and symptoms improved. Pt reports receiving a blood transfusion yesterday when she went to see her doctor. BP prior to transfusion was 107/74. Troponin I was negative. CXR shows retrocardiac shadow but pt has no signs of infection. Receives dialysis on Tu, Th, and Sa and has not missed dialysis.    Clinical Impression  Pt admitted with above. Pt currently with functional limitations due to the deficits listed below (see PT Problem List). Pt unsteady on her feet.  Pt has had multiple falls at home per pt and daughter as well as husband has fallen.  Spoke with daughter Janace Hoard via phone and voiced concerns with daughter.  Explained that NHP would be for therapy and short term.  Also discussed that if pt does not go to NH for therapy recommend 24 hour care.  Daughter aware of recommendations and safety concerns with pt and husband at home.  Discussed that daughter still needs to arrange for care in home per insurance coverage and life alert vs. A living as ultimate d/c plan as pt and husband appear to be struggling.  Daughter said she was going to call SW and let them know she is ok with NH short term for pt.   Pt will benefit from skilled PT to increase their independence and safety with mobility to allow discharge to the venue listed below.     Follow Up Recommendations SNF;Supervision/Assistance - 24 hour (If family refuses SNF, HHPT, HHOT, HHaide and HHSW recommended)     Equipment Recommendations  Other (comment) (TBA)    Recommendations for  Other Services       Precautions / Restrictions Precautions Precautions: Fall Restrictions Weight Bearing Restrictions: No      Mobility  Bed Mobility Overal bed mobility: Needs Assistance Bed Mobility: Supine to Sit     Supine to sit: Min assist     General bed mobility comments: Pt needed assist to scoot to EOb and assist for elevation of trunk.Took incr time.   Transfers Overall transfer level: Needs assistance Equipment used: Rolling walker (2 wheeled) Transfers: Sit to/from Stand Sit to Stand: Min assist         General transfer comment: Pt needed cues for hand placement as well as steadying assist.   Pt stopped at 3N1 to try and use potty but couldn't go.    Ambulation/Gait Ambulation/Gait assistance: Min assist Ambulation Distance (Feet): 26 Feet Assistive device: Rolling walker (2 wheeled) Gait Pattern/deviations: Step-to pattern;Decreased stride length;Narrow base of support;Trunk flexed;Shuffle   Gait velocity interpretation: Below normal speed for age/gender General Gait Details: Pt needed cues for step length.  Pt ambulating slowly and unsteady at times.  Needed steadying assist with gait and cues for safety with rW.  Cannot accept challenges to balance.  Pt reports multiple falls at home.  She states she doesn't use her RW.  Daughter aware that pt does not use her RW and that pt and father fall at home.  Daughter expresses frustration that her siblings are not helping her with her parents and that she is doing the best she can trying to  work and help her parents.    Stairs            Wheelchair Mobility    Modified Rankin (Stroke Patients Only)       Balance Overall balance assessment: Needs assistance;History of Falls Sitting-balance support: No upper extremity supported;Feet supported Sitting balance-Leahy Scale: Fair     Standing balance support: Bilateral upper extremity supported;During functional activity Standing balance-Leahy Scale:  Poor Standing balance comment: Pt requires UE support at all times in standing.  Unsteady.             High level balance activites: Direction changes;Turns;Head turns High Level Balance Comments: Needs min assist with above.               Pertinent Vitals/Pain Pain Assessment: No/denies pain  VSS    Home Living Family/patient expects to be discharged to:: Private residence Living Arrangements: Spouse/significant other Available Help at Discharge: Family;Available 24 hours/day (supervision as husband does not get around so well ) Type of Home: House Home Access: Stairs to enter Entrance Stairs-Rails: None Entrance Stairs-Number of Steps: 1 Home Layout: One level Home Equipment: [place] - 4 wheels;Wheelchair - [employer];Shower seat Additional Comments: Legs give way on her off the commode and off the chair.    Prior Function Level of Independence: Independent with assistive device(s)         Comments: ambulates with rollator due to generalized weakness from the CA tx.       Hand Dominance        Extremity/Trunk Assessment   Upper Extremity Assessment: Defer to OT evaluation           Lower Extremity Assessment: Generalized weakness         Communication   Communication: No difficulties  Cognition Arousal/Alertness: Awake/alert Behavior During Therapy: WFL for tasks assessed/performed Overall Cognitive Status: Within Functional Limits for tasks assessed                      General Comments General comments (skin integrity, edema, etc.): Recommended NH and notified CM.  SW called this PT to call pts daughter to discuss reasoning for NH.  When this PT called daughter daughter in agreement with NH for therapy and is also going to line up incr care in home for her father and mother as wellas a life alert.     Exercises        Assessment/Plan    PT Assessment Patient needs continued PT services  PT Diagnosis Generalized  weakness   PT Problem List Decreased mobility;Decreased activity tolerance;Decreased balance;Decreased knowledge of use of DME;Decreased safety awareness;Decreased knowledge of precautions  PT Treatment Interventions DME instruction;Gait training;Functional mobility training;Therapeutic activities;Therapeutic exercise;Balance training;Patient/family education   PT Goals (Current goals can be found in the Care Plan section) Acute Rehab PT Goals Patient Stated Goal: to go get therapy and then go home PT Goal Formulation: With patient Time For Goal Achievement: 09/23/14 Potential to Achieve Goals: Good    Frequency Min 3X/week   Barriers to discharge Decreased caregiver support      Co-evaluation               End of Session Equipment Utilized During Treatment: Gait belt Activity Tolerance: Patient limited by fatigue Patient left: in chair;with call bell/phone within reach;with chair alarm set Nurse Communication: Mobility status    Functional Assessment Tool Used: clinical judgment Functional Limitation: Mobility: Walking and moving around Mobility: Walking and Moving Around Current Status [phone]): At  least 20 percent but less than 40 percent impaired, limited or restricted Mobility: Walking and Moving Around Goal Status [phone]): At least 1 percent but less than 20 percent impaired, limited or restricted    Time: 0957-1019 PT Time Calculation (min) (ACUTE ONLY): 22 min   Charges:   PT Evaluation $Initial PT Evaluation Tier I: 1 Procedure PT Treatments $Gait Training: 8-22 mins   PT G Codes:   Functional Assessment Tool Used: clinical judgment Functional Limitation: Mobility: Walking and moving around    [place], [place] F 09/09/2014, 1:37 PM [facility] [facility] [phone] (pager)

## 2014-09-09 NOTE — Progress Notes (Signed)
PROGRESS NOTE  [name] CMK:[id number] DOB: 06/26/1941 DOA: 09/07/2014 PCP: [name], MD  HPI/Subjective: 73 yo female with ESRD on hemodialysis, nonobstructive CAD, hypertension, pancytopenia, history of recurrent non-Hodgkin's lymphoma presented to the ED with complaints of sudden onset shortness of breath, chest pressure, and had last night.  BP in ED was found to be 234/97.  IV labetalol was started and symptoms improved.  Pt reports receiving a blood transfusion yesterday when she went to see her doctor. BP prior to transfusion was 107/74.  Troponin I was negative.  CXR shows retrocardiac shadow but pt has no signs of infection.  Receives dialysis on Tu, Th, and Sa and has not missed dialysis.    Pt is improved today.  Slept well throughout the night.  Less tremulous today.           Assessment/Plan:  Hypertensive urgency:  BP improved but still elevated.  Last BP 189/64.  HR 53.  Troponin I x 3 normal.   Blood pressure elevated after blood transfusion on Monday, cannot rule out mild overload. Check for improvement of BP after pt receives dialysis.   Would not increase dose of beta blocker because of risk of bradycardia.   Continue Amlodipine, Metoprolol. Continue Telemetry.  I will be very careful with PRN clonidine because of bradycardia.  ESRD on hemodialysis: Pt on Tu, Th, Sa hemodialysis schedule and has not missed dialysis.  Left arm AV fistula.  Stable.  Cr 5.55, GFR 7.  Continue routine BMPs. Nephrology on board.  Pt to receive HD today.  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  Recently received bone marrow biopsy.  Being followed by oncology.   Pancytopenia: Chronic problem.  Managed by oncology.  Continue routine CBC.   Chronic atrial fibrillation: Pt not on anticoagulation therapy due to pancytopenia.  Continue Amiodarone.   Diabetes Mellitus, type 2: Chronic problem.  Stable.       Retrocardiac consolidation: Seen on CXR on  11/10.  No signs or symptoms of infection.  Repeat CXR?       DVT Prophylaxis:  SCDs  Code Status: Full Family Communication: Pt is awake and alert.  Disposition Plan: likely will need placement.   Consultants:  Nephrology   Procedures:  Hemodialysis  Antibiotics:  None  Objective: Filed Vitals:   09/09/14 0225 09/09/14 0325 09/09/14 0535 09/09/14 0838  BP: 163/57 161/57 166/64 189/64  Pulse:   53   Temp:   98 F (36.7 C)   TempSrc:   Oral   Resp:      Height:      Weight:   53.933 kg (118 lb 14.4 oz)   SpO2:   98%     Intake/Output Summary (Last 24 hours) at 09/09/14 0841 Last data filed at 09/09/14 0820  Gross per 24 hour  Intake    723 ml  Output      0 ml  Net    723 ml   Filed Weights   09/07/14 1810 09/08/14 0455 09/09/14 0535  Weight: 53.4 kg (117 lb 11.6 oz) 57.425 kg (126 lb 9.6 oz) 53.933 kg (118 lb 14.4 oz)    Exam: General: Thin appearing, comfortable, NAD, appears stated age  71:  EOMI, Anicteic Sclera, MMM  Cardiovascular: RRR, S1 S2 auscultated, no rubs, murmurs or gallops.   Respiratory: Clear to auscultation bilaterally with equal chest rise Abdomen: Soft, nontender, nondistended, + bowel sounds  Extremities: warm dry without cyanosis clubbing or edema.  Neuro: AAOx3, cranial  nerves grossly intact. Strength 5/5 in upper and lower extremities  Skin: Without rashes exudates or nodules.   Psych: Normal affect and demeanor with intact judgement and insight   Data Reviewed: Basic Metabolic Panel:  Recent Labs Lab 09/03/14 1217 09/07/14 0400 09/07/14 0738 09/07/14 1235 09/08/14 0034 09/09/14 0332  NA 142 140 141  --  141 141  K 3.3 4.1 4.0  --  3.3* 3.6*  CL 95* 93* 95*  --  99 99  CO2 32 26 25  --  27 26  GLUCOSE 129* 164* 140*  --  126* 134*  BUN 13 40* 43*  --  15 27*  CREATININE 3.6* 6.28* 6.38*  --  3.16* 5.55*  CALCIUM 8.9 9.3 9.1  --  8.4 8.6  PHOS  --   --   --  4.7*  --   --    Liver Function Tests:  Recent  Labs Lab 09/03/14 1217 09/07/14 0400 09/07/14 0738  AST _0 ALT _1 ALKPHOS 54 72 67  BILITOT 0.50 0.5 0.4  PROT 6.3* 6.3 6.2  ALBUMIN  --  3.8 3.8   CBC:  Recent Labs Lab 09/03/14 1217 09/07/14 0400 09/07/14 0738 09/08/14 0034  WBC 4.2 7.2 5.3 5.2  NEUTROABS 2.3 3.8 3.4  --   HGB 9.6* 11.8* 11.7* 11.4*  HCT 30.7* 36.1 36.4 34.7*  MCV 108* 98.1 101.4* 97.7  PLT 112* 82* 76* 84*   Cardiac Enzymes:  Recent Labs Lab 09/07/14 0400 09/07/14 0738 09/07/14 1235 09/08/14 0034  TROPONINI <0.30 <0.30 <0.30 <0.30   BNP (last 3 results)  Recent Labs  11/03/13 1655 09/07/14 0400  PROBNP 6465.0* >70000.0*    Recent Results (from the past 240 hour(s))  MRSA PCR Screening     Status: None   Collection Time: 09/07/14  7:03 AM  Result Value Ref Range Status   MRSA by PCR NEGATIVE NEGATIVE Final    Comment:        The GeneXpert MRSA Assay (FDA approved for NASAL specimens only), is one component of a comprehensive MRSA colonization surveillance program. It is not intended to diagnose MRSA infection nor to guide or monitor treatment for MRSA infections.      Studies: Dg Chest 2 View 09/07/2014 IMPRESSION: Enlargement of cardiac silhouette with pulmonary vascular congestion.  Atelectasis versus consolidation in LEFT lower lobe with minimal RIGHT basilar atelectasis.     Scheduled Meds: . amiodarone  200 mg Oral BH-q7a  . amLODipine  5 mg Oral Daily  . aspirin EC  81 mg Oral Daily  . calcitRIOL  0.5 mcg Oral Q T,Th,[name]  . docusate sodium  100 mg Oral BID  . famciclovir  250 mg Oral Once per day on Tue Thu Sat  . ferric gluconate (FERRLECIT/NULECIT) IV  62.5 mg Intravenous Q Thu-HD  . fluticasone  2 spray Each Nare BH-q7a  . megestrol  800 mg Oral Daily  . metoprolol tartrate  25 mg Oral BID  . multivitamin  1 tablet Oral QHS  . olopatadine  1 drop Both Eyes BH-q7a  . pantoprazole  40 mg Oral Daily  . rosuvastatin  10 mg Oral QHS  . sevelamer  carbonate  2,400 mg Oral TID WC  . sodium chloride  3 mL Intravenous Q12H  . sodium chloride  3 mL Intravenous Q12H   Continuous Infusions:   Principal Problem:   Hypertensive urgency Active Problems:   Large cell (diffuse) non-Hodgkin's lymphoma   Diabetes  mellitus type 2, noninsulin dependent   ESRD on hemodialysis   Chronic atrial fibrillation    Adelene Idler PA-S  [facility] Pager [phone]. If 7PM-7AM, please contact night-coverage at [email or web address], password [facility] 09/09/2014, 8:41 AM  LOS: 2 days     Addendum  Patient seen and examined, chart and data base reviewed.  I agree with the above assessment and plan.  For full details please see Mrs. Adelene Idler PA-S note.  I reviewed and amended the above note as appropriate.   Birdie Hopes, MD [facility] Pager: [phone] 09/09/2014, 11:40 AM

## 2014-09-09 NOTE — Plan of Care (Signed)
Problem: Consults Goal: General Medical Patient Education See Patient Education Module for specific education.  Outcome: Completed/Met Date Met:  09/09/14 Goal: Skin Care Protocol Initiated - if Braden Score 18 or less If consults are not indicated, leave blank or document N/A  Outcome: Not Applicable Date Met:  15/95/39  Problem: Phase I Progression Outcomes Goal: OOB as tolerated unless otherwise ordered Outcome: Progressing Goal: Hemodynamically stable Outcome: Progressing

## 2014-09-09 NOTE — Progress Notes (Signed)
S:  CO some constipation. No CP.Marland Kitchen Says she is eating O:BP 166/64 mmHg  Pulse 53  Temp(Src) 98 F (36.7 C) (Oral)  Resp 16  Ht 5\' 2"  (1.575 m)  Wt 53.933 kg (118 lb 14.4 oz)  BMI 21.74 kg/m2  SpO2 98%  Intake/Output Summary (Last 24 hours) at 09/09/14 1191 Last data filed at 09/08/14 2200  Gross per 24 hour  Intake    363 ml  Output      0 ml  Net    363 ml   Weight change: -2.567 kg (-5 lb 10.6 oz) YNW:GNFAO and alert CVS: bradycardic, reg +S4 gallop Resp: Clear Abd:+ BS NTND Ext:no edema  LUA AVF + bruit NEURO:CNI, follows commands, No asterixis   . amiodarone  200 mg Oral BH-q7a  . amLODipine  5 mg Oral Daily  . aspirin EC  81 mg Oral Daily  . calcitRIOL  0.5 mcg Oral Q T,Th,[name]  . docusate sodium  100 mg Oral BID  . famciclovir  250 mg Oral Once per day on Tue Thu Sat  . ferric gluconate (FERRLECIT/NULECIT) IV  62.5 mg Intravenous Q Thu-HD  . fluticasone  2 spray Each Nare BH-q7a  . megestrol  800 mg Oral Daily  . metoprolol tartrate  25 mg Oral BID  . multivitamin  1 tablet Oral QHS  . olopatadine  1 drop Both Eyes BH-q7a  . pantoprazole  40 mg Oral Daily  . rosuvastatin  10 mg Oral QHS  . sevelamer carbonate  2,400 mg Oral TID WC  . sodium chloride  3 mL Intravenous Q12H  . sodium chloride  3 mL Intravenous Q12H   Dg Chest 2 View  09/07/2014   CLINICAL DATA:  CHF, post hemodialysis  EXAM: CHEST  2 VIEW  COMPARISON:  Earlier exam of 09/07/2014  FINDINGS: RIGHT jugular Port-A-Cath with tip projecting over SVC near cavoatrial junction.  Enlargement of cardiac silhouette with pulmonary vascular congestion.  Atherosclerotic calcification aorta.  Atelectasis versus consolidation in LEFT lower lobe.  Minimal atelectasis at RIGHT base.  Lungs otherwise clear.  No gross pleural effusion or pneumothorax.  Skin fold projects over LEFT chest.  IMPRESSION: Enlargement of cardiac silhouette with pulmonary vascular congestion.  Atelectasis versus consolidation in LEFT lower  lobe with minimal RIGHT basilar atelectasis.   Electronically Signed   By: Lavonia Dana M.D.   On: 09/07/2014 20:42   BMET    Component Value Date/Time   NA 141 09/09/2014 0332   NA 142 09/03/2014 1217   K 3.6* 09/09/2014 0332   K 3.3 09/03/2014 1217   CL 99 09/09/2014 0332   CL 95* 09/03/2014 1217   CO2 26 09/09/2014 0332   CO2 32 09/03/2014 1217   GLUCOSE 134* 09/09/2014 0332   GLUCOSE 129* 09/03/2014 1217   BUN 27* 09/09/2014 0332   BUN 13 09/03/2014 1217   CREATININE 5.55* 09/09/2014 0332   CREATININE 3.6* 09/03/2014 1217   CALCIUM 8.6 09/09/2014 0332   CALCIUM 8.9 09/03/2014 1217   GFRNONAA 7* 09/09/2014 0332   GFRAA 8* 09/09/2014 0332   CBC    Component Value Date/Time   WBC 5.2 09/08/2014 0034   WBC 4.2 09/03/2014 1217   RBC 3.55* 09/08/2014 0034   RBC 2.85* 09/03/2014 1217   RBC 2.97* 08/04/2013 0918   HGB 11.4* 09/08/2014 0034   HGB 9.6* 09/03/2014 1217   HCT 34.7* 09/08/2014 0034   HCT 30.7* 09/03/2014 1217   PLT 84* 09/08/2014 0034   PLT 112*  09/03/2014 1217   MCV 97.7 09/08/2014 0034   MCV 108* 09/03/2014 1217   MCH 32.1 09/08/2014 0034   MCH 33.7 09/03/2014 1217   MCHC 32.9 09/08/2014 0034   MCHC 31.3* 09/03/2014 1217   RDW 19.4* 09/08/2014 0034   RDW 15.4 09/03/2014 1217   LYMPHSABS 1.1 09/07/2014 0738   LYMPHSABS 1.3 09/03/2014 1217   MONOABS 0.7 09/07/2014 0738   EOSABS 0.0 09/07/2014 0738   EOSABS 0.0 09/03/2014 1217   BASOSABS 0.0 09/07/2014 0738   BASOSABS 0.0 09/03/2014 1217     Assessment: 1. HTN, BP a little better 2. 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 Anemia   ESA on hold 4. Recurrent non-Hodgkin's lymphoma 5.  Underlying Dementia 6. Thrombocytopenia 7.  Mild hypokalemia, would not treat.  Will do HD on higher K bath  Plan: 1.  HD today 2. Dulcolax PRN Annali Lybrand T

## 2014-09-10 ENCOUNTER — Other Ambulatory Visit: Payer: Self-pay | Admitting: Hematology & Oncology

## 2014-09-10 MED ORDER — AMLODIPINE BESYLATE 5 MG PO TABS: 5.0000 mg | ORAL_TABLET | Freq: Every day | ORAL | Status: AC

## 2014-09-10 NOTE — Plan of Care (Signed)
Problem: Phase I Progression Outcomes Goal: OOB as tolerated unless otherwise ordered Outcome: Completed/Met Date Met:  09/10/14 Goal: Initial discharge plan identified Outcome: Completed/Met Date Met:  09/10/14 Goal: Hemodynamically stable Outcome: Completed/Met Date Met:  09/10/14

## 2014-09-10 NOTE — Discharge Summary (Signed)
Physician Discharge Summary  [name] BPZ:[id number] DOB: 21-Jul-1941 DOA: 09/07/2014  PCP: [name], MD  Admit date: 09/07/2014 Discharge date: 09/10/2014  Time spent: 40 minutes  Recommendations for Outpatient Follow-up:  1. Follow-up with primary care physician within one week.  Discharge Diagnoses:  Principal Problem:   Hypertensive urgency Active Problems:   Large cell (diffuse) non-Hodgkin's lymphoma   Diabetes mellitus type 2, noninsulin dependent   ESRD on hemodialysis   Chronic atrial fibrillation   Discharge Condition: stable  Diet recommendation: heart healthy diet  Filed Weights   09/09/14 0535 09/09/14 1128 09/10/14 0547  Weight: 53.933 kg (118 lb 14.4 oz) 55.6 kg (122 lb 9.2 oz) 53.3 kg (117 lb 8.1 oz)    History of present illness:  [name] is a 73 y.o. female with history of ESRD on hemodialysis, nonobstructive CAD, hypertension, pancytopenia, history of recurrent non-Hodgkin's lymphoma started experiencing sudden onset of shortness of breath with chest pressure and headache last midnight. Patient states that over the last 1 week she has been noticed to have an elevated blood pressure. In the ER patient was found to have blood pressure more than 242 systolic. Patient was given labetalol IV following which patient depression improved and patient's symptoms improved. Patient also had mild nausea and on exam patient was nonfocal. Chest x-ray shows a retrocardiac shadow but patient does not have any signs of infection to suggest pneumonia. Patient has dialysis on Tuesday Thursday and Saturday patient states she has not missed her dialysis. Cardiac markers are negative.   Hospital Course:   Hypertensive urgency:  Patient presented to the hospital with systolic blood pressure more than 200. BP improved with IV pushes of labetalol but still elevated, Troponin I x 3 normal.  Blood pressure elevated after blood transfusion on Monday, cannot  rule out mild  overload. Would not increase dose of beta blocker because of risk of bradycardia.  Started on 5 mg of amlodipine. Blood pressure improved.  ESRD on hemodialysis: Pt on Tu, Th, Sa hemodialysis schedule and has not missed dialysis. Left arm AV fistula. Stable. Cr 5.55, GFR 7. Continue routine BMPs. Nephrology on board. Pt to receive HD today.  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 Recently received bone marrow biopsy. Being followed by oncology.   Pancytopenia: Chronic problem, secondary to recurrent lymphoma. Managed by oncology.   Chronic atrial fibrillation: Pt not on anticoagulation therapy due to pancytopenia. Continue Amiodarone.   Diabetes Mellitus, type 2: Chronic problem. Stable.   Retrocardiac consolidation: Seen on CXR on 11/10. No signs or symptoms of infection. Repeat CXR?   Disposition Agents seen by PTand recommended SNF placement. Patient refused, social worker approach her daughter, she also wants patient is to go home.  Forgetfulness Patient is very forgetful, especially short term memory, with things done in the hospital. Not sure this is part of dementia hour she was just confused because of acute illness. Patient might need evaluation for dementia as outpatient.  Procedures:  none  Consultations:  nephrology  Discharge Exam: Filed Vitals:   09/10/14 0848  BP: 130/66  Pulse:   Temp:   Resp:    General: Alert and awake, oriented x3, not in any acute distress. HEENT: anicteric sclera, pupils reactive to light and accommodation, EOMI CVS: S1-S2 clear, no murmur rubs or gallops Chest: clear to auscultation bilaterally, no wheezing, rales or rhonchi Abdomen: soft nontender, nondistended, normal bowel sounds, no organomegaly Extremities: no cyanosis, clubbing or edema noted bilaterally Neuro: Cranial nerves II-XII  intact, no focal neurological deficits  Discharge Instructions You were cared for  by a hospitalist during your hospital stay. If you have any questions about your discharge medications or the care you received while you were in the hospital after you are discharged, you can call the unit and asked to speak with the hospitalist on call if the hospitalist that took care of you is not available. Once you are discharged, your primary care physician will handle any further medical issues. Please note that NO REFILLS for any discharge medications will be authorized once you are discharged, as it is imperative that you return to your primary care physician (or establish a relationship with a primary care physician if you do not have one) for your aftercare needs so that they can reassess your need for medications and monitor your lab values.  Discharge Instructions    Diet - low sodium heart healthy    Complete by:  As directed      Increase activity slowly    Complete by:  As directed           Current Discharge Medication List    START taking these medications   Details  amLODipine (NORVASC) 5 MG tablet Take 1 tablet (5 mg total) by mouth daily. Qty: 30 tablet, Refills: 0      CONTINUE these medications which have NOT CHANGED   Details  ALPRAZolam (XANAX) 0.5 MG tablet Take 0.5 mg by mouth at bedtime as needed for anxiety or sleep.    amiodarone (PACERONE) 200 MG tablet Take 200 mg by mouth every morning.     CRESTOR 10 MG tablet Take 10 mg by mouth at bedtime.    Associated Diagnoses: Large cell (diffuse) non-Hodgkin's lymphoma; Anemia of renal disease    docusate sodium (COLACE) 100 MG capsule Take 100 mg by mouth 2 (two) times daily.    esomeprazole (NEXIUM) 40 MG capsule Take 40 mg by mouth 2 (two) times daily before a meal.     famciclovir (FAMVIR) 250 MG tablet Take 250 mg by mouth 3 (three) times a week. Tuesday, Thursday and Saturday    fentaNYL (DURAGESIC - DOSED MCG/HR) 12 MCG/HR Place 12.5 mcg onto the skin every 3 (three) days as needed (pain).      fluticasone (FLONASE) 50 MCG/ACT nasal spray Place 2 sprays into both nostrils every morning.     lactulose (CHRONULAC) 10 GM/15ML solution Take 10 g by mouth 2 (two) times daily as needed for mild constipation.    lidocaine-prilocaine (EMLA) cream Apply 1 application topically as needed (for port-a-cath access).    metoprolol tartrate (LOPRESSOR) 25 MG tablet Take 1 tablet (25 mg total) by mouth 2 (two) times daily. Qty: 60 tablet, Refills: 0    olopatadine (PATANOL) 0.1 % ophthalmic solution Place 1 drop into both eyes every morning.     prochlorperazine (COMPAZINE) 10 MG tablet Take 0.5-1 tablets (5-10 mg total) by mouth every 6 (six) hours as needed for nausea or vomiting. Qty: 60 tablet, Refills: 3   Associated Diagnoses: Large cell (diffuse) non-Hodgkin's lymphoma    sevelamer carbonate (RENVELA) 800 MG tablet Take 2,400 mg by mouth 3 (three) times daily.    traMADol (ULTRAM) 50 MG tablet Take 50 mg by mouth every 6 (six) hours as needed for moderate pain.    megestrol (MEGACE) 400 MG/10ML suspension Take 20 mLs (800 mg total) by mouth daily. Qty: 480 mL, Refills: 3   Associated Diagnoses: NHL (non-Hodgkin's lymphoma); Cardiac cachexia  STOP taking these medications     diphenhydrAMINE (BENADRYL) 25 MG tablet        Allergies  Allergen Reactions  . Advicor [Niacin-Lovastatin Er]     Muscle cramps  . Crestor [Rosuvastatin]     Muscle cramps  . Lipitor [Atorvastatin]     Muscle cramps  . Lovastatin     Muscle cramps  . Morphine And Related Other (See Comments)    "made me crazy"  . Niaspan [Niacin Er]     Muscle cramps  . Nsaids     Gi side effects  . Pravachol [Pravastatin Sodium]     Gi side effects  . Sulfa Antibiotics Other (See Comments)    Can't remember   . Vytorin [Ezetimibe-Simvastatin] Nausea Only  . Welchol [Colesevelam Hcl]     Muscle cramps  . Zetia [Ezetimibe]     Muscle cramps   . Zocor [Simvastatin]     Muscle cramps    Follow-up  Information    Follow up with [facility].   Why:  Registered Nurse, Physical therapy, occupational therapy   Contact information:   [address] [place] [address] [phone]       Follow up with [name], MD In 1 week.   Specialty:  Family Medicine   Contact information:   [address] [place] [address] [phone]        The results of significant diagnostics from this hospitalization (including imaging, microbiology, ancillary and laboratory) are listed below for reference.    Significant Diagnostic Studies: Dg Chest 2 View  09/07/2014   CLINICAL DATA:  CHF, post hemodialysis  EXAM: CHEST  2 VIEW  COMPARISON:  Earlier exam of 09/07/2014  FINDINGS: RIGHT jugular Port-A-Cath with tip projecting over SVC near cavoatrial junction.  Enlargement of cardiac silhouette with pulmonary vascular congestion.  Atherosclerotic calcification aorta.  Atelectasis versus consolidation in LEFT lower lobe.  Minimal atelectasis at RIGHT base.  Lungs otherwise clear.  No gross pleural effusion or pneumothorax.  Skin fold projects over LEFT chest.  IMPRESSION: Enlargement of cardiac silhouette with pulmonary vascular congestion.  Atelectasis versus consolidation in LEFT lower lobe with minimal RIGHT basilar atelectasis.   Electronically Signed   By: Lavonia Dana M.D.   On: 09/07/2014 20:42   Ct Biopsy  08/25/2014   CLINICAL DATA:  History of non-Hodgkin's lymphoma. The patient requires bone marrow biopsy for restaging purposes.  EXAM: CT GUIDED ASPIRATE AND CORE BIOPSY OF RIGHT ILIAC BONE MARROW  ANESTHESIA/SEDATION: Versed 1.0 mg IV, Fentanyl 50 mcg IV  Total Moderate Sedation Time  10 minutes.  PROCEDURE: The procedure risks, benefits, and alternatives were explained to the patient. Questions regarding the procedure were encouraged and answered. The patient understands and consents to the procedure.  The right gluteal region was prepped with  Betadine. Sterile gown and sterile gloves were used for the procedure. Local anesthesia was provided with 1% Lidocaine.  Under CT guidance, an 11 gauge OnControl bone cutting needle was advanced from a posterior approach into the right iliac bone. Needle positioning was confirmed with CT. Initial non heparinized and heparinized aspirate samples were obtained of bone marrow.  Core biopsy was performed via the outer needle.  COMPLICATIONS: None  FINDINGS: Inspection of initial aspirate did reveal visible particles. Intact core biopsy was obtained.  IMPRESSION: CT guided bone marrow biopsy of right posterior iliac bone with both aspirate and core samples obtained.   Electronically Signed   By: Eulas Post  Kathlene Cote M.D.   On: 08/25/2014 09:55   Dg Chest Port 1 View  09/07/2014   CLINICAL DATA:  Chest pain, shortness of breath and nausea beginning this morning. History of non-Hodgkin's lymphoma.  EXAM: PORTABLE CHEST - 1 VIEW  COMPARISON:  Chest radiograph July 17, 2014  FINDINGS: The cardiac silhouette appears mildly enlarged, similar. Calcified aortic knob. Retrocardiac consolidation. Similar central pulmonary vascular congestion. No pleural effusions. No pneumothorax.  Mildly widened RIGHT acromioclavicular joint space may be postoperative. Single lumen RIGHT chest Port-A-Cath with distal tip at the caval atrial junction.  IMPRESSION: Retrocardiac consolidation concerning for pneumonia. Recommend followup chest radiograph after treatment to verify improvement.  Stable cardiomegaly and central pulmonary vascular congestion.   Electronically Signed   By: Elon Alas   On: 09/07/2014 04:14    Microbiology: Recent Results (from the past 240 hour(s))  MRSA PCR Screening     Status: None   Collection Time: 09/07/14  7:03 AM  Result Value Ref Range Status   MRSA by PCR NEGATIVE NEGATIVE Final    Comment:        The GeneXpert MRSA Assay (FDA approved for NASAL specimens only), is one component of  a comprehensive MRSA colonization surveillance program. It is not intended to diagnose MRSA infection nor to guide or monitor treatment for MRSA infections.      Labs: Basic Metabolic Panel:  Recent Labs Lab 09/03/14 1217 09/07/14 0400 09/07/14 0738 09/07/14 1235 09/08/14 0034 09/09/14 0332  NA 142 140 141  --  141 141  K 3.3 4.1 4.0  --  3.3* 3.6*  CL 95* 93* 95*  --  99 99  CO2 32 26 25  --  27 26  GLUCOSE 129* 164* 140*  --  126* 134*  BUN 13 40* 43*  --  15 27*  CREATININE 3.6* 6.28* 6.38*  --  3.16* 5.55*  CALCIUM 8.9 9.3 9.1  --  8.4 8.6  PHOS  --   --   --  4.7*  --   --    Liver Function Tests:  Recent Labs Lab 09/03/14 1217 09/07/14 0400 09/07/14 0738  AST 18 20 16   ALT 13 15 14   ALKPHOS 54 72 67  BILITOT 0.50 0.5 0.4  PROT 6.3* 6.3 6.2  ALBUMIN  --  3.8 3.8   No results for input(s): LIPASE, AMYLASE in the last 168 hours. No results for input(s): AMMONIA in the last 168 hours. CBC:  Recent Labs Lab 09/03/14 1217 09/07/14 0400 09/07/14 0738 09/08/14 0034  WBC 4.2 7.2 5.3 5.2  NEUTROABS 2.3 3.8 3.4  --   HGB 9.6* 11.8* 11.7* 11.4*  HCT 30.7* 36.1 36.4 34.7*  MCV 108* 98.1 101.4* 97.7  PLT 112* 82* 76* 84*   Cardiac Enzymes:  Recent Labs Lab 09/07/14 0400 09/07/14 0738 09/07/14 1235 09/08/14 0034  TROPONINI <0.30 <0.30 <0.30 <0.30   BNP: BNP (last 3 results)  Recent Labs  11/03/13 1655 09/07/14 0400  PROBNP 6465.0* >70000.0*   CBG: No results for input(s): GLUCAP in the last 168 hours.     Signed:  Lian Tanori A  [facility] 09/10/2014, 12:04 PM

## 2014-09-10 NOTE — Discharge Instructions (Signed)
Hypertension Hypertension is another name for high blood pressure. High blood pressure forces your heart to work harder to pump blood. A blood pressure reading has two numbers, which includes a higher number over a lower number (example: 110/72). HOME CARE   Have your blood pressure rechecked by your doctor.  Only take medicine as told by your doctor. Follow the directions carefully. The medicine does not work as well if you skip doses. Skipping doses also puts you at risk for problems.  Do not smoke.  Monitor your blood pressure at home as told by your doctor. GET HELP IF:  You think you are having a reaction to the medicine you are taking.  You have repeat headaches or feel dizzy.  You have puffiness (swelling) in your ankles.  You have trouble with your vision. GET HELP RIGHT AWAY IF:   You get a very bad headache and are confused.  You feel weak, numb, or faint.  You get chest or belly (abdominal) pain.  You throw up (vomit).  You cannot breathe very well. MAKE SURE YOU:   Understand these instructions.  Will watch your condition.  Will get help right away if you are not doing well or get worse. Document Released: 04/02/2008 Document Revised: 10/20/2013 Document Reviewed: 08/07/2013 ExitCare Patient Information 2015 [employer]. This information is not intended to replace advice given to you by your health care provider. Make sure you discuss any questions you have with your health care provider.  

## 2014-09-10 NOTE — Progress Notes (Signed)
S:  No new CO.  Wants to go home O:BP 149/67 mmHg  Pulse 59  Temp(Src) 98.3 F (36.8 C) (Oral)  Resp 21  Ht 5\' 2"  (1.575 m)  Wt 53.3 kg (117 lb 8.1 oz)  BMI 21.49 kg/m2  SpO2 100%  Intake/Output Summary (Last 24 hours) at 09/10/14 0718 Last data filed at 09/10/14 0600  Gross per 24 hour  Intake    863 ml  Output   3000 ml  Net  -2137 ml   Weight change: 1.667 kg (3 lb 10.8 oz) KNL:ZJQBH and alert CVS: bradycardic, reg  Resp: Clear Abd:+ BS NTND Ext:no edema  LUA AVF + bruit NEURO:CNI, follows commands, No asterixis   . amiodarone  200 mg Oral BH-q7a  . amLODipine  5 mg Oral Daily  . aspirin EC  81 mg Oral Daily  . calcitRIOL  0.5 mcg Oral Q T,Th,[name]  . docusate sodium  100 mg Oral BID  . famciclovir  250 mg Oral Once per day on Tue Thu Sat  . ferric gluconate (FERRLECIT/NULECIT) IV  62.5 mg Intravenous Q Thu-HD  . fluticasone  2 spray Each Nare BH-q7a  . megestrol  800 mg Oral Daily  . metoprolol tartrate  25 mg Oral BID  . multivitamin  1 tablet Oral QHS  . olopatadine  1 drop Both Eyes BH-q7a  . pantoprazole  40 mg Oral Daily  . rosuvastatin  10 mg Oral QHS  . sevelamer carbonate  2,400 mg Oral TID WC  . sodium chloride  3 mL Intravenous Q12H  . sodium chloride  3 mL Intravenous Q12H   No results found. BMET    Component Value Date/Time   NA 141 09/09/2014 0332   NA 142 09/03/2014 1217   K 3.6* 09/09/2014 0332   K 3.3 09/03/2014 1217   CL 99 09/09/2014 0332   CL 95* 09/03/2014 1217   CO2 26 09/09/2014 0332   CO2 32 09/03/2014 1217   GLUCOSE 134* 09/09/2014 0332   GLUCOSE 129* 09/03/2014 1217   BUN 27* 09/09/2014 0332   BUN 13 09/03/2014 1217   CREATININE 5.55* 09/09/2014 0332   CREATININE 3.6* 09/03/2014 1217   CALCIUM 8.6 09/09/2014 0332   CALCIUM 8.9 09/03/2014 1217   GFRNONAA 7* 09/09/2014 0332   GFRAA 8* 09/09/2014 0332   CBC    Component Value Date/Time   WBC 5.2 09/08/2014 0034   WBC 4.2 09/03/2014 1217   RBC 3.55* 09/08/2014 0034   RBC 2.85* 09/03/2014 1217   RBC 2.97* 08/04/2013 0918   HGB 11.4* 09/08/2014 0034   HGB 9.6* 09/03/2014 1217   HCT 34.7* 09/08/2014 0034   HCT 30.7* 09/03/2014 1217   PLT 84* 09/08/2014 0034   PLT 112* 09/03/2014 1217   MCV 97.7 09/08/2014 0034   MCV 108* 09/03/2014 1217   MCH 32.1 09/08/2014 0034   MCH 33.7 09/03/2014 1217   MCHC 32.9 09/08/2014 0034   MCHC 31.3* 09/03/2014 1217   RDW 19.4* 09/08/2014 0034   RDW 15.4 09/03/2014 1217   LYMPHSABS 1.1 09/07/2014 0738   LYMPHSABS 1.3 09/03/2014 1217   MONOABS 0.7 09/07/2014 0738   EOSABS 0.0 09/07/2014 0738   EOSABS 0.0 09/03/2014 1217   BASOSABS 0.0 09/07/2014 0738   BASOSABS 0.0 09/03/2014 1217     Assessment: 1. HTN, BP a little better 2. 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 Anemia   ESA on hold 4. Recurrent non-Hodgkin's lymphoma 5.  Underlying Dementia 6. Thrombocytopenia 7.  Mild hypokalemia, would not  treat.  Will do HD on higher K bath  Plan: 1.  HD tomorrow if still here.  If plans are not to go to SNF then she could go home  Debra Calabretta T

## 2014-09-13 ENCOUNTER — Ambulatory Visit (HOSPITAL_COMMUNITY): Payer: Medicare Other

## 2014-09-13 ENCOUNTER — Encounter (HOSPITAL_COMMUNITY): Payer: Medicare Other

## 2014-09-14 ENCOUNTER — Inpatient Hospital Stay (HOSPITAL_COMMUNITY)
Admission: EM | Admit: 2014-09-14 | Discharge: 2014-09-21 | DRG: 388 | Disposition: A | Discharge status: Skilled Nursing Facility | Payer: Medicare Other | Attending: Internal Medicine | Admitting: Internal Medicine

## 2014-09-14 ENCOUNTER — Other Ambulatory Visit: Payer: Self-pay

## 2014-09-14 ENCOUNTER — Encounter (HOSPITAL_COMMUNITY): Payer: Self-pay

## 2014-09-14 ENCOUNTER — Emergency Department (HOSPITAL_COMMUNITY): Payer: Medicare Other

## 2014-09-14 DIAGNOSIS — E875 Hyperkalemia: Secondary | ICD-10-CM | POA: Diagnosis present

## 2014-09-14 DIAGNOSIS — E780 Pure hypercholesterolemia: Secondary | ICD-10-CM | POA: Diagnosis present

## 2014-09-14 DIAGNOSIS — Z7951 Long term (current) use of inhaled steroids: Secondary | ICD-10-CM

## 2014-09-14 DIAGNOSIS — K5641 Fecal impaction: Secondary | ICD-10-CM

## 2014-09-14 DIAGNOSIS — N186 End stage renal disease: Secondary | ICD-10-CM | POA: Diagnosis present

## 2014-09-14 DIAGNOSIS — Z888 Allergy status to other drugs, medicaments and biological substances status: Secondary | ICD-10-CM

## 2014-09-14 DIAGNOSIS — N189 Chronic kidney disease, unspecified: Secondary | ICD-10-CM

## 2014-09-14 DIAGNOSIS — Z66 Do not resuscitate: Secondary | ICD-10-CM | POA: Diagnosis present

## 2014-09-14 DIAGNOSIS — Z87891 Personal history of nicotine dependence: Secondary | ICD-10-CM

## 2014-09-14 DIAGNOSIS — Z881 Allergy status to other antibiotic agents status: Secondary | ICD-10-CM

## 2014-09-14 DIAGNOSIS — R627 Adult failure to thrive: Secondary | ICD-10-CM | POA: Diagnosis present

## 2014-09-14 DIAGNOSIS — Z882 Allergy status to sulfonamides status: Secondary | ICD-10-CM

## 2014-09-14 DIAGNOSIS — D61810 Antineoplastic chemotherapy induced pancytopenia: Secondary | ICD-10-CM | POA: Diagnosis present

## 2014-09-14 DIAGNOSIS — J9811 Atelectasis: Secondary | ICD-10-CM | POA: Diagnosis present

## 2014-09-14 DIAGNOSIS — Z515 Encounter for palliative care: Secondary | ICD-10-CM

## 2014-09-14 DIAGNOSIS — I5032 Chronic diastolic (congestive) heart failure: Secondary | ICD-10-CM | POA: Diagnosis present

## 2014-09-14 DIAGNOSIS — K5660 Unspecified intestinal obstruction: Secondary | ICD-10-CM | POA: Diagnosis present

## 2014-09-14 DIAGNOSIS — I441 Atrioventricular block, second degree: Secondary | ICD-10-CM | POA: Diagnosis present

## 2014-09-14 DIAGNOSIS — R05 Cough: Secondary | ICD-10-CM

## 2014-09-14 DIAGNOSIS — G253 Myoclonus: Secondary | ICD-10-CM | POA: Diagnosis present

## 2014-09-14 DIAGNOSIS — F419 Anxiety disorder, unspecified: Secondary | ICD-10-CM | POA: Diagnosis present

## 2014-09-14 DIAGNOSIS — Z992 Dependence on renal dialysis: Secondary | ICD-10-CM

## 2014-09-14 DIAGNOSIS — R0600 Dyspnea, unspecified: Secondary | ICD-10-CM

## 2014-09-14 DIAGNOSIS — K567 Ileus, unspecified: Secondary | ICD-10-CM | POA: Diagnosis not present

## 2014-09-14 DIAGNOSIS — E46 Unspecified protein-calorie malnutrition: Secondary | ICD-10-CM | POA: Diagnosis present

## 2014-09-14 DIAGNOSIS — Z885 Allergy status to narcotic agent status: Secondary | ICD-10-CM

## 2014-09-14 DIAGNOSIS — K649 Unspecified hemorrhoids: Secondary | ICD-10-CM | POA: Diagnosis present

## 2014-09-14 DIAGNOSIS — I481 Persistent atrial fibrillation: Secondary | ICD-10-CM | POA: Diagnosis present

## 2014-09-14 DIAGNOSIS — R059 Cough, unspecified: Secondary | ICD-10-CM

## 2014-09-14 DIAGNOSIS — N2581 Secondary hyperparathyroidism of renal origin: Secondary | ICD-10-CM | POA: Diagnosis present

## 2014-09-14 DIAGNOSIS — I120 Hypertensive chronic kidney disease with stage 5 chronic kidney disease or end stage renal disease: Secondary | ICD-10-CM | POA: Diagnosis present

## 2014-09-14 DIAGNOSIS — K56609 Unspecified intestinal obstruction, unspecified as to partial versus complete obstruction: Secondary | ICD-10-CM | POA: Diagnosis present

## 2014-09-14 DIAGNOSIS — E119 Type 2 diabetes mellitus without complications: Secondary | ICD-10-CM | POA: Diagnosis present

## 2014-09-14 DIAGNOSIS — R1031 Right lower quadrant pain: Secondary | ICD-10-CM

## 2014-09-14 DIAGNOSIS — R109 Unspecified abdominal pain: Secondary | ICD-10-CM

## 2014-09-14 DIAGNOSIS — M1990 Unspecified osteoarthritis, unspecified site: Secondary | ICD-10-CM | POA: Diagnosis present

## 2014-09-14 DIAGNOSIS — R112 Nausea with vomiting, unspecified: Secondary | ICD-10-CM | POA: Diagnosis not present

## 2014-09-14 DIAGNOSIS — I2510 Atherosclerotic heart disease of native coronary artery without angina pectoris: Secondary | ICD-10-CM | POA: Diagnosis present

## 2014-09-14 DIAGNOSIS — Z90710 Acquired absence of both cervix and uterus: Secondary | ICD-10-CM

## 2014-09-14 DIAGNOSIS — K219 Gastro-esophageal reflux disease without esophagitis: Secondary | ICD-10-CM | POA: Diagnosis present

## 2014-09-14 DIAGNOSIS — R531 Weakness: Secondary | ICD-10-CM

## 2014-09-14 DIAGNOSIS — I480 Paroxysmal atrial fibrillation: Secondary | ICD-10-CM | POA: Diagnosis present

## 2014-09-14 DIAGNOSIS — C8330 Diffuse large B-cell lymphoma, unspecified site: Secondary | ICD-10-CM | POA: Diagnosis present

## 2014-09-14 DIAGNOSIS — R1032 Left lower quadrant pain: Secondary | ICD-10-CM

## 2014-09-14 DIAGNOSIS — D631 Anemia in chronic kidney disease: Secondary | ICD-10-CM | POA: Diagnosis present

## 2014-09-14 DIAGNOSIS — Z923 Personal history of irradiation: Secondary | ICD-10-CM

## 2014-09-14 DIAGNOSIS — F40240 Claustrophobia: Secondary | ICD-10-CM | POA: Diagnosis present

## 2014-09-14 DIAGNOSIS — G9340 Encephalopathy, unspecified: Secondary | ICD-10-CM | POA: Diagnosis present

## 2014-09-14 DIAGNOSIS — C8590 Non-Hodgkin lymphoma, unspecified, unspecified site: Secondary | ICD-10-CM | POA: Diagnosis present

## 2014-09-14 DIAGNOSIS — R001 Bradycardia, unspecified: Secondary | ICD-10-CM | POA: Diagnosis present

## 2014-09-14 LAB — COMPREHENSIVE METABOLIC PANEL
ALT: 7 U/L (ref 0–35)
AST: 14 U/L (ref 0–37)
Albumin: 3.4 g/dL — ABNORMAL LOW (ref 3.5–5.2)
Alkaline Phosphatase: 69 U/L (ref 39–117)
Anion gap: 23 — ABNORMAL HIGH (ref 5–15)
BUN: 60 mg/dL — ABNORMAL HIGH (ref 6–23)
CALCIUM: 9.4 mg/dL (ref 8.4–10.5)
CO2: 24 meq/L (ref 19–32)
CREATININE: 7.41 mg/dL — AB (ref 0.50–1.10)
Chloride: 90 mEq/L — ABNORMAL LOW (ref 96–112)
GFR, EST AFRICAN AMERICAN: 6 mL/min — AB (ref >90)
GFR, EST NON AFRICAN AMERICAN: 5 mL/min — AB (ref >90)
Glucose, Bld: 164 mg/dL — ABNORMAL HIGH (ref 70–99)
Potassium: 5.1 mEq/L (ref 3.7–5.3)
Sodium: 137 mEq/L (ref 137–147)
Total Bilirubin: 0.4 mg/dL (ref 0.3–1.2)
Total Protein: 6.3 g/dL (ref 6.0–8.3)

## 2014-09-14 LAB — URINALYSIS, ROUTINE W REFLEX MICROSCOPIC
BILIRUBIN URINE: NEGATIVE
Glucose, UA: 250 mg/dL — AB
Ketones, ur: 15 mg/dL — AB
NITRITE: NEGATIVE
PROTEIN: 100 mg/dL — AB
Specific Gravity, Urine: 1.015 (ref 1.005–1.030)
UROBILINOGEN UA: 0.2 mg/dL (ref 0.0–1.0)
pH: 7.5 (ref 5.0–8.0)

## 2014-09-14 LAB — CBC WITH DIFFERENTIAL/PLATELET
BASOS ABS: 0 10*3/uL (ref 0.0–0.1)
Basophils Relative: 0 % (ref 0–1)
EOS PCT: 0 % (ref 0–5)
Eosinophils Absolute: 0 10*3/uL (ref 0.0–0.7)
HEMATOCRIT: 43.2 % (ref 36.0–46.0)
Hemoglobin: 14.1 g/dL (ref 12.0–15.0)
Lymphocytes Relative: 26 % (ref 12–46)
Lymphs Abs: 1.2 10*3/uL (ref 0.7–4.0)
MCH: 32.6 pg (ref 26.0–34.0)
MCHC: 32.6 g/dL (ref 30.0–36.0)
MCV: 100 fL (ref 78.0–100.0)
MONO ABS: 0.8 10*3/uL (ref 0.1–1.0)
Monocytes Relative: 17 % — ABNORMAL HIGH (ref 3–12)
Neutro Abs: 2.6 10*3/uL (ref 1.7–7.7)
Neutrophils Relative %: 56 % (ref 43–77)
Platelets: 118 10*3/uL — ABNORMAL LOW (ref 150–400)
RBC: 4.32 MIL/uL (ref 3.87–5.11)
RDW: 17.9 % — AB (ref 11.5–15.5)
WBC: 4.7 10*3/uL (ref 4.0–10.5)

## 2014-09-14 LAB — URINE MICROSCOPIC-ADD ON

## 2014-09-14 LAB — TROPONIN I: Troponin I: <0.3 ng/mL (ref <0.30)

## 2014-09-14 LAB — LIPASE, BLOOD: LIPASE: 33 U/L (ref 11–59)

## 2014-09-14 MED ORDER — IOHEXOL 300 MG/ML  SOLN
25.0000 mL | Freq: Once | INTRAMUSCULAR | Status: AC | PRN
  Administered 2014-09-14: 25 mL via ORAL

## 2014-09-14 MED ORDER — ONDANSETRON HCL 4 MG/2ML IJ SOLN
4.0000 mg | Freq: Once | INTRAMUSCULAR | Status: AC
  Administered 2014-09-14: 4 mg via INTRAVENOUS
  Filled 2014-09-14: qty 2

## 2014-09-14 MED ORDER — IOHEXOL 300 MG/ML  SOLN
100.0000 mL | Freq: Once | INTRAMUSCULAR | Status: AC | PRN
  Administered 2014-09-14: 100 mL via INTRAVENOUS

## 2014-09-14 MED ORDER — FENTANYL CITRATE 0.05 MG/ML IJ SOLN
50.0000 ug | Freq: Once | INTRAMUSCULAR | Status: AC
  Administered 2014-09-14: 50 ug via INTRAVENOUS
  Filled 2014-09-14: qty 2

## 2014-09-14 NOTE — ED Notes (Signed)
Per EMS, Patient had generalized weakness started two weeks ago. Patient was admitted here last Tuesday for Generalized Weakness and HTN. Patient has a Hx of HTN and the patient's medication are not controlling the HTN. Discharged on Friday. Patient felt no changed at discharge. Saturday, patient went to dialysis. Sunday the patient started to vomit. Patient continuous to be nauseous with no vomiting. Starting yesterday, patient has lower abdominal pain and difficulty urinating. NO HX of UTI. Denies any blood in urine or odor. Starting today, generalized weakness has affected patient's gait. Vitals per EMS: 192/85, 65 HR, 20 RR, 97 % on RA with Wheezing noted Left Upper. CBG 153.

## 2014-09-14 NOTE — ED Provider Notes (Signed)
CSN: [id number]     Arrival date & time 09/14/14  1829 History   First MD Initiated Contact with Patient 09/14/14 1843     Chief Complaint  Patient presents with  . Fatigue  . Hypertension     (Consider location/radiation/quality/duration/timing/severity/associated sxs/prior Treatment) Patient is a 73 y.o. female presenting with hypertension. The history is provided by the patient and a relative.  Hypertension This is a new problem. Associated symptoms include abdominal pain. Pertinent negatives include no shortness of breath.  patient presents with abdominal pain and feeling bad.for the last few days. She was recently admitted to the hospital for  severe hypertension. Statesthat she's had increasing abdominal pain. She's had nausea and vomiting. Some loose stools. No fevers, but has had some chills. She missed dialysis today because she was feeling so bad.no dysuria. The pain is constant.  Past Medical History  Diagnosis Date  . Hypertension   . Anxiety     h/o anxiety attacks  . Anemia of renal disease   . Claustrophobia   . Panic attacks   . 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 CAD (coronary artery disease)   . Atrial fibrillation   . High cholesterol     hx  . Type 2 diabetes mellitus     "don't take RX now" (11/04/2013)  . Arthritis     "left hip" (11/04/2013)  . ESRD (end stage renal disease) on dialysis 2014    "[facility]; TTS" (11/04/2013)  . 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 Appendectomy  2009  . Cardiac catheterization  2011  . Shoulder open rotator cuff repair Right ?2013  . Esophagogastroduodenoscopy (egd) with propofol  10/06/2012    Procedure: ESOPHAGOGASTRODUODENOSCOPY (EGD) WITH PROPOFOL;  Surgeon: Arta Silence, MD;  Location: [facility];  Service: Endoscopy;  Laterality: N/A;  . Colonoscopy with propofol  10/06/2012    Procedure: COLONOSCOPY WITH  PROPOFOL;  Surgeon: Arta Silence, MD;  Location: [facility];  Service: Endoscopy;  Laterality: N/A;  . Colonoscopy with propofol N/A 01/14/2013    Procedure: COLONOSCOPY WITH PROPOFOL;  Surgeon: Arta Silence, MD;  Location: [facility];  Service: Endoscopy;  Laterality: N/A;  . Portacath placement Right 01/22/2013  . Av fistula placement Left 07/24/2013    Procedure: ARTERIOVENOUS (AV) FISTULA CREATION- LEFT ;  Surgeon: Angelia Mould, MD;  Location: [place];  Service: Vascular;  Laterality: Left;  . Abdominal hysterectomy  1980's  . Tonsillectomy  1954   Family History  Problem Relation Age of Onset  . Cancer Mother     throat  . Alzheimer's disease Sister   . Cancer Brother     lymphoma  . Cancer Sister     unknown type   History  Substance Use Topics  . Smoking status: Former Smoker -- 0.25 packs/day for 15 years    Types: Cigarettes    Start date: 11/25/1948    Quit date: 04/01/1964  . Smokeless tobacco: Never Used     Comment: quit 59 years ago  . Alcohol Use: No   OB History    No data available     Review of Systems  Constitutional: Positive for appetite change and fatigue. Negative for fever.  Respiratory: Negative for shortness of breath.   Gastrointestinal: Positive for nausea, vomiting, abdominal pain and diarrhea.  Genitourinary: Negative for flank pain.  Musculoskeletal: Positive for back pain.  Skin:  Negative for wound.      Allergies  Advicor; Crestor; Lipitor; Lovastatin; Morphine and related; Niaspan; Nsaids; Pravachol; Sulfa antibiotics; Vytorin; Welchol; Zetia; and Zocor  Home Medications   Prior to Admission medications   Medication Sig Start Date End Date Taking? Authorizing Provider  ALPRAZolam Duanne Moron) 0.5 MG tablet Take 0.5 mg by mouth at bedtime as needed for anxiety or sleep.   Yes Historical Provider, MD  amiodarone (PACERONE) 200 MG tablet Take 200 mg by mouth every morning.    Yes Historical Provider, MD  amLODipine (NORVASC)  5 MG tablet Take 1 tablet (5 mg total) by mouth daily. 09/10/14  Yes Verlee Monte, MD  CRESTOR 10 MG tablet Take 10 mg by mouth at bedtime.  05/13/14  Yes Historical Provider, MD  docusate sodium (COLACE) 100 MG capsule Take 100 mg by mouth 2 (two) times daily.   Yes Historical Provider, MD  esomeprazole (NEXIUM) 40 MG capsule Take 40 mg by mouth 2 (two) times daily before a meal.    Yes Historical Provider, MD  famciclovir (FAMVIR) 250 MG tablet Take 250 mg by mouth 3 (three) times a week. Tuesday, Thursday and Saturday   Yes Historical Provider, MD  fentaNYL (DURAGESIC - DOSED MCG/HR) 12 MCG/HR Place 12.5 mcg onto the skin every 3 (three) days as needed (pain).    Yes Historical Provider, MD  fluticasone (FLONASE) 50 MCG/ACT nasal spray Place 2 sprays into both nostrils every morning.    Yes Historical Provider, MD  lactulose (CHRONULAC) 10 GM/15ML solution Take 10 g by mouth 2 (two) times daily as needed for mild constipation.   Yes Historical Provider, MD  lidocaine-prilocaine (EMLA) cream Apply 1 application topically as needed (for port-a-cath access).   Yes Historical Provider, MD  megestrol (MEGACE) 400 MG/10ML suspension Take 20 mLs (800 mg total) by mouth daily. 09/03/14  Yes Volanda Napoleon, MD  metoprolol tartrate (LOPRESSOR) 25 MG tablet Take 1 tablet (25 mg total) by mouth 2 (two) times daily. 11/04/13  Yes Delfina Redwood, MD  olopatadine (PATANOL) 0.1 % ophthalmic solution Place 1 drop into both eyes every morning.    Yes Historical Provider, MD  prochlorperazine (COMPAZINE) 10 MG tablet Take 0.5-1 tablets (5-10 mg total) by mouth every 6 (six) hours as needed for nausea or vomiting. 03/08/14  Yes Volanda Napoleon, MD  sevelamer carbonate (RENVELA) 800 MG tablet Take 2,400 mg by mouth 3 (three) times daily.   Yes Historical Provider, MD  traMADol (ULTRAM) 50 MG tablet Take 50 mg by mouth every 6 (six) hours as needed for moderate pain.   Yes Historical Provider, MD  famciclovir (FAMVIR)  250 MG tablet TAKE 0.5 TABLET BY MOUTH ON TUESDAY, THURSDAY, AND SATURDAY AFTER DIALYSIS Patient not taking: Reported on 09/14/2014 09/12/14   Volanda Napoleon, MD   BP 184/61 mmHg  Pulse 67  Temp(Src) 98.6 F (37 C) (Oral)  Resp 17  Ht 5\' 2"  (1.575 m)  Wt 123 lb (55.792 kg)  BMI 22.49 kg/m2  SpO2 99% Physical Exam  Constitutional: She is oriented to person, place, and time. She appears well-developed.  Patient appears uncomfortable  Eyes: Pupils are equal, round, and reactive to light.  Neck: Neck supple.  Cardiovascular: Normal rate.   Pulmonary/Chest: No respiratory distress. She has no wheezes.  Abdominal: There is tenderness.  Moderate diffuse tenderness, worse in the lower abdomen.  Musculoskeletal: She exhibits edema.  Mild bilateral lower extremity pitting edema  Neurological: She is alert and oriented to  person, place, and time.  Skin: Skin is warm.    ED Course  Procedures (including critical care time) Labs Review Labs Reviewed  CBC WITH DIFFERENTIAL - Abnormal; Notable for the following:    RDW 17.9 (*)    Platelets 118 (*)    Monocytes Relative 17 (*)    All other components within normal limits  COMPREHENSIVE METABOLIC PANEL - Abnormal; Notable for the following:    Chloride 90 (*)    Glucose, Bld 164 (*)    BUN 60 (*)    Creatinine, Ser 7.41 (*)    Albumin 3.4 (*)    GFR calc non Af Amer 5 (*)    GFR calc Af Amer 6 (*)    Anion gap 23 (*)    All other components within normal limits  URINALYSIS, ROUTINE W REFLEX MICROSCOPIC - Abnormal; Notable for the following:    APPearance HAZY (*)    Glucose, UA 250 (*)    Hgb urine dipstick MODERATE (*)    Ketones, ur 15 (*)    Protein, ur 100 (*)    Leukocytes, UA MODERATE (*)    All other components within normal limits  URINE MICROSCOPIC-ADD ON - Abnormal; Notable for the following:    Bacteria, UA FEW (*)    All other components within normal limits  LIPASE, BLOOD  TROPONIN I    Imaging Review Ct  Abdomen Pelvis W Contrast  09/15/2014   CLINICAL DATA:  Bilateral lower quadrant abdominal pain  EXAM: CT ABDOMEN AND PELVIS WITH CONTRAST  TECHNIQUE: Multidetector CT imaging of the abdomen and pelvis was performed using the standard protocol following bolus administration of intravenous contrast.  CONTRAST:  152mL OMNIPAQUE IOHEXOL 300 MG/ML  SOLN  COMPARISON:  07/17/2014  FINDINGS: Lung bases demonstrate bibasilar atelectatic changes. Very minimal pleural fluid is noted on the right. The gallbladder is well distended and demonstrates enhancing gallbladder polyps. These are better visualized than on the prior exam. Fluid attenuation lesion is again noted within the lateral spleen stable from the prior exam. The right adrenal gland is within normal limits. The left adrenal gland again demonstrates a low-density mass with peripheral calcifications stable from the previous exam. The pancreas is unremarkable.  The kidneys again show changes of renal cystic change. No renal calculi or obstructive changes are noted. Delayed images demonstrate normal excretion of contrast. There are dilated loops of distal small bowel with fecalization of small bowel contents. No definitive transition zone is identified. Correlation with physical exam is recommended as this may represent ileus. No free air is seen. No free fluid is noted. Diffuse aortoiliac calcifications are noted. Scanning into the pelvis reveals the bladder to be well distended. No pelvic mass lesion is noted. Uterus has been surgically removed. The appendix has also been removed. No acute bony abnormality is noted.  IMPRESSION: Multiple dilated loops of mid to distal small bowel without definitive transition zone. The terminal ileum is within normal limits. These changes may be related to adhesions or possibly a small bowel ileus. Correlation with the physical exam is recommended.  Pole ladder polyp stable from the prior exam.  Stable low-density lesions in the  spleen and left adrenal gland. No other new focal abnormality is noted.   Electronically Signed   By: Inez Catalina M.D.   On: 09/15/2014 00:01   Dg Abd Acute W/chest  09/14/2014   CLINICAL DATA:  Initial evaluation for vomiting and diarrhea for 1 week. History of lymphoma.  EXAM: ACUTE  ABDOMEN SERIES (ABDOMEN 2 VIEW & CHEST 1 VIEW)  COMPARISON:  Prior radiograph from 09/07/2014.  FINDINGS: Transverse heart size at the upper limits of normal, stable from prior. Mediastinal silhouette within normal limits. Atherosclerotic plaque present within the aortic arch. Right-sided Port-A-Cath stable disc position with tip overlying the cavoatrial junction.  Lungs are normally inflated. No focal infiltrate, pulmonary edema or pleural effusion. No pneumothorax.  Bowel gas pattern within normal limits without evidence of obstruction or ileus. No abnormal bowel wall thickening. No free intraperitoneal air. No soft tissue mass or abnormal calcifications. Surgical clips overlie the right lower quadrant.  Multilevel degenerative changes present throughout the visualized spine, most prevalent at the lumbosacral junction. No acute osseous abnormality.  IMPRESSION: 1. Nonobstructive bowel gas pattern with no radiographic evidence of acute intra-abdominal abnormality. 2. No active cardiopulmonary disease.   Electronically Signed   By: Jeannine Boga M.D.   On: 09/14/2014 20:47     EKG Interpretation   Date/Time:  Tuesday September 14 2014 19:23:33 EST Ventricular Rate:  66 PR Interval:    QRS Duration: 140 QT Interval:  487 QTC Calculation: 510 R Axis:   169 Text Interpretation:  Sinus rhythm No significant change since last  tracing Right bundle branch block Lateral infarct, age indeterminate  Confirmed by Maisyn Nouri  MD, Shivansh Hardaway [phone]) on 09/14/2014 8:38:02 PM      MDM   Final diagnoses:  Abdominal pain  SBO (small bowel obstruction)    Patient with nausea vomiting for the last few days. Diffuse  abdominal tenderness without fever. Unknown if patient is passing gas. Patient and family are not sure. X-ray reassuring. CT scan shows possible small bowel obstruction versus ileus. Has continued moderate tenderness. Has not vomited contrast. Will admit to internal medicine. With surgery consult.    Jasper Riling. Alvino Chapel, [place] 09/15/14 [phone]

## 2014-09-14 NOTE — ED Notes (Signed)
Patient transported to CT 

## 2014-09-14 NOTE — ED Notes (Signed)
The patient is unable to give an urine specimen at this time. The tech has reported to the RN in charge. 

## 2014-09-14 NOTE — ED Notes (Signed)
Patient transported to X-ray 

## 2014-09-14 NOTE — ED Notes (Addendum)
Hinton Dyer in [place] informed pt has finished drinking her contrast

## 2014-09-15 ENCOUNTER — Inpatient Hospital Stay (HOSPITAL_COMMUNITY): Payer: Medicare Other

## 2014-09-15 DIAGNOSIS — I482 Chronic atrial fibrillation: Secondary | ICD-10-CM

## 2014-09-15 DIAGNOSIS — C8590 Non-Hodgkin lymphoma, unspecified, unspecified site: Secondary | ICD-10-CM | POA: Diagnosis present

## 2014-09-15 DIAGNOSIS — I120 Hypertensive chronic kidney disease with stage 5 chronic kidney disease or end stage renal disease: Secondary | ICD-10-CM | POA: Diagnosis present

## 2014-09-15 DIAGNOSIS — J9811 Atelectasis: Secondary | ICD-10-CM | POA: Diagnosis present

## 2014-09-15 DIAGNOSIS — K567 Ileus, unspecified: Secondary | ICD-10-CM | POA: Diagnosis present

## 2014-09-15 DIAGNOSIS — K56609 Unspecified intestinal obstruction, unspecified as to partial versus complete obstruction: Secondary | ICD-10-CM | POA: Diagnosis present

## 2014-09-15 DIAGNOSIS — N186 End stage renal disease: Secondary | ICD-10-CM

## 2014-09-15 DIAGNOSIS — R112 Nausea with vomiting, unspecified: Secondary | ICD-10-CM | POA: Diagnosis present

## 2014-09-15 DIAGNOSIS — I480 Paroxysmal atrial fibrillation: Secondary | ICD-10-CM | POA: Diagnosis present

## 2014-09-15 DIAGNOSIS — Z87891 Personal history of nicotine dependence: Secondary | ICD-10-CM | POA: Diagnosis not present

## 2014-09-15 DIAGNOSIS — Z7951 Long term (current) use of inhaled steroids: Secondary | ICD-10-CM | POA: Diagnosis not present

## 2014-09-15 DIAGNOSIS — I481 Persistent atrial fibrillation: Secondary | ICD-10-CM | POA: Diagnosis present

## 2014-09-15 DIAGNOSIS — G9340 Encephalopathy, unspecified: Secondary | ICD-10-CM | POA: Diagnosis present

## 2014-09-15 DIAGNOSIS — Z885 Allergy status to narcotic agent status: Secondary | ICD-10-CM | POA: Diagnosis not present

## 2014-09-15 DIAGNOSIS — R001 Bradycardia, unspecified: Secondary | ICD-10-CM | POA: Diagnosis present

## 2014-09-15 DIAGNOSIS — K649 Unspecified hemorrhoids: Secondary | ICD-10-CM | POA: Diagnosis present

## 2014-09-15 DIAGNOSIS — M1990 Unspecified osteoarthritis, unspecified site: Secondary | ICD-10-CM | POA: Diagnosis present

## 2014-09-15 DIAGNOSIS — E46 Unspecified protein-calorie malnutrition: Secondary | ICD-10-CM | POA: Diagnosis present

## 2014-09-15 DIAGNOSIS — F419 Anxiety disorder, unspecified: Secondary | ICD-10-CM | POA: Diagnosis present

## 2014-09-15 DIAGNOSIS — Z923 Personal history of irradiation: Secondary | ICD-10-CM | POA: Diagnosis not present

## 2014-09-15 DIAGNOSIS — Z882 Allergy status to sulfonamides status: Secondary | ICD-10-CM | POA: Diagnosis not present

## 2014-09-15 DIAGNOSIS — E875 Hyperkalemia: Secondary | ICD-10-CM | POA: Diagnosis present

## 2014-09-15 DIAGNOSIS — R627 Adult failure to thrive: Secondary | ICD-10-CM | POA: Diagnosis present

## 2014-09-15 DIAGNOSIS — Z992 Dependence on renal dialysis: Secondary | ICD-10-CM | POA: Diagnosis not present

## 2014-09-15 DIAGNOSIS — K5669 Other intestinal obstruction: Secondary | ICD-10-CM

## 2014-09-15 DIAGNOSIS — E119 Type 2 diabetes mellitus without complications: Secondary | ICD-10-CM | POA: Diagnosis present

## 2014-09-15 DIAGNOSIS — K219 Gastro-esophageal reflux disease without esophagitis: Secondary | ICD-10-CM | POA: Diagnosis present

## 2014-09-15 DIAGNOSIS — Z888 Allergy status to other drugs, medicaments and biological substances status: Secondary | ICD-10-CM | POA: Diagnosis not present

## 2014-09-15 DIAGNOSIS — I2510 Atherosclerotic heart disease of native coronary artery without angina pectoris: Secondary | ICD-10-CM

## 2014-09-15 DIAGNOSIS — G253 Myoclonus: Secondary | ICD-10-CM | POA: Diagnosis present

## 2014-09-15 DIAGNOSIS — D631 Anemia in chronic kidney disease: Secondary | ICD-10-CM

## 2014-09-15 DIAGNOSIS — D61810 Antineoplastic chemotherapy induced pancytopenia: Secondary | ICD-10-CM | POA: Diagnosis present

## 2014-09-15 DIAGNOSIS — I441 Atrioventricular block, second degree: Secondary | ICD-10-CM | POA: Diagnosis present

## 2014-09-15 DIAGNOSIS — I5032 Chronic diastolic (congestive) heart failure: Secondary | ICD-10-CM | POA: Diagnosis present

## 2014-09-15 DIAGNOSIS — K5660 Unspecified intestinal obstruction: Secondary | ICD-10-CM | POA: Diagnosis present

## 2014-09-15 DIAGNOSIS — N2581 Secondary hyperparathyroidism of renal origin: Secondary | ICD-10-CM | POA: Diagnosis present

## 2014-09-15 DIAGNOSIS — Z515 Encounter for palliative care: Secondary | ICD-10-CM | POA: Diagnosis not present

## 2014-09-15 DIAGNOSIS — F40240 Claustrophobia: Secondary | ICD-10-CM | POA: Diagnosis present

## 2014-09-15 DIAGNOSIS — Z881 Allergy status to other antibiotic agents status: Secondary | ICD-10-CM | POA: Diagnosis not present

## 2014-09-15 DIAGNOSIS — Z90710 Acquired absence of both cervix and uterus: Secondary | ICD-10-CM | POA: Diagnosis not present

## 2014-09-15 DIAGNOSIS — C8330 Diffuse large B-cell lymphoma, unspecified site: Secondary | ICD-10-CM

## 2014-09-15 DIAGNOSIS — Z66 Do not resuscitate: Secondary | ICD-10-CM | POA: Diagnosis present

## 2014-09-15 DIAGNOSIS — E780 Pure hypercholesterolemia: Secondary | ICD-10-CM | POA: Diagnosis present

## 2014-09-15 LAB — CBC
HEMATOCRIT: 40.6 % (ref 36.0–46.0)
HEMOGLOBIN: 13.4 g/dL (ref 12.0–15.0)
MCH: 32.5 pg (ref 26.0–34.0)
MCHC: 33 g/dL (ref 30.0–36.0)
MCV: 98.5 fL (ref 78.0–100.0)
Platelets: 107 10*3/uL — ABNORMAL LOW (ref 150–400)
RBC: 4.12 MIL/uL (ref 3.87–5.11)
RDW: 18 % — AB (ref 11.5–15.5)
WBC: 7.2 10*3/uL (ref 4.0–10.5)

## 2014-09-15 LAB — COMPREHENSIVE METABOLIC PANEL
ALBUMIN: 3.3 g/dL — AB (ref 3.5–5.2)
ALT: 6 U/L (ref 0–35)
AST: 13 U/L (ref 0–37)
Alkaline Phosphatase: 65 U/L (ref 39–117)
Anion gap: 23 — ABNORMAL HIGH (ref 5–15)
BILIRUBIN TOTAL: 0.4 mg/dL (ref 0.3–1.2)
BUN: 67 mg/dL — ABNORMAL HIGH (ref 6–23)
CO2: 24 mEq/L (ref 19–32)
CREATININE: 7.78 mg/dL — AB (ref 0.50–1.10)
Calcium: 9.3 mg/dL (ref 8.4–10.5)
Chloride: 90 mEq/L — ABNORMAL LOW (ref 96–112)
GFR calc Af Amer: 5 mL/min — ABNORMAL LOW (ref >90)
GFR calc non Af Amer: 5 mL/min — ABNORMAL LOW (ref >90)
Glucose, Bld: 151 mg/dL — ABNORMAL HIGH (ref 70–99)
Potassium: 5.8 mEq/L — ABNORMAL HIGH (ref 3.7–5.3)
Sodium: 137 mEq/L (ref 137–147)
Total Protein: 6 g/dL (ref 6.0–8.3)

## 2014-09-15 LAB — GLUCOSE, CAPILLARY: Glucose-Capillary: 165 mg/dL — ABNORMAL HIGH (ref 70–99)

## 2014-09-15 LAB — PROTIME-INR
INR: 1.37 (ref 0.00–1.49)
Prothrombin Time: 17 seconds — ABNORMAL HIGH (ref 11.6–15.2)

## 2014-09-15 MED ORDER — ALPRAZOLAM 0.5 MG PO TABS: 0.5000 mg | ORAL_TABLET | Freq: Every evening | ORAL | Status: DC | PRN

## 2014-09-15 MED ORDER — FENTANYL 12 MCG/HR TD PT72
12.5000 ug | MEDICATED_PATCH | TRANSDERMAL | Status: DC | PRN
Start: 2014-09-15 — End: 2014-09-18

## 2014-09-15 MED ORDER — ONDANSETRON HCL 4 MG/2ML IJ SOLN
4.0000 mg | Freq: Four times a day (QID) | INTRAMUSCULAR | Status: DC | PRN
  Administered 2014-09-15: 4 mg via INTRAVENOUS
  Filled 2014-09-15: qty 2

## 2014-09-15 MED ORDER — AMIODARONE HCL 200 MG PO TABS
200.0000 mg | ORAL_TABLET | ORAL | Status: DC
Start: 2014-09-15 — End: 2014-09-15
  Filled 2014-09-15 (×2): qty 1

## 2014-09-15 MED ORDER — TRAMADOL HCL 50 MG PO TABS: 50.0000 mg | ORAL_TABLET | Freq: Four times a day (QID) | ORAL | Status: DC | PRN

## 2014-09-15 MED ORDER — HEPARIN SODIUM (PORCINE) 5000 UNIT/ML IJ SOLN
5000.0000 [IU] | Freq: Three times a day (TID) | INTRAMUSCULAR | Status: DC
  Administered 2014-09-15 – 2014-09-21 (×13): 5000 [IU] via SUBCUTANEOUS
  Filled 2014-09-15 (×21): qty 1

## 2014-09-15 MED ORDER — FLUTICASONE PROPIONATE 50 MCG/ACT NA SUSP
2.0000 | NASAL | Status: DC
  Administered 2014-09-15 – 2014-09-21 (×5): 2 via NASAL
  Filled 2014-09-15: qty 16

## 2014-09-15 MED ORDER — PANTOPRAZOLE SODIUM 40 MG PO TBEC
40.0000 mg | DELAYED_RELEASE_TABLET | Freq: Every day | ORAL | Status: DC
  Administered 2014-09-15 – 2014-09-21 (×5): 40 mg via ORAL
  Filled 2014-09-15 (×4): qty 1

## 2014-09-15 MED ORDER — ONDANSETRON HCL 4 MG/2ML IJ SOLN: 4.0000 mg | Freq: Four times a day (QID) | INTRAMUSCULAR | Status: DC | PRN

## 2014-09-15 MED ORDER — SEVELAMER CARBONATE 800 MG PO TABS
2400.0000 mg | ORAL_TABLET | Freq: Three times a day (TID) | ORAL | Status: DC
  Filled 2014-09-15 (×4): qty 3

## 2014-09-15 MED ORDER — AMLODIPINE BESYLATE 5 MG PO TABS
5.0000 mg | ORAL_TABLET | Freq: Every day | ORAL | Status: DC
  Administered 2014-09-15 – 2014-09-21 (×6): 5 mg via ORAL
  Filled 2014-09-15 (×7): qty 1

## 2014-09-15 MED ORDER — VALACYCLOVIR HCL 500 MG PO TABS
500.0000 mg | ORAL_TABLET | ORAL | Status: DC
  Administered 2014-09-16 – 2014-09-21 (×3): 500 mg via ORAL
  Filled 2014-09-15 (×3): qty 1

## 2014-09-15 MED ORDER — FENTANYL CITRATE 0.05 MG/ML IJ SOLN
INTRAMUSCULAR | Status: AC
  Filled 2014-09-15: qty 2

## 2014-09-15 MED ORDER — ONDANSETRON 8 MG/NS 50 ML IVPB
8.0000 mg | Freq: Four times a day (QID) | INTRAVENOUS | Status: DC | PRN
  Filled 2014-09-15: qty 8

## 2014-09-15 MED ORDER — ACETAMINOPHEN 325 MG PO TABS
650.0000 mg | ORAL_TABLET | Freq: Four times a day (QID) | ORAL | Status: DC | PRN
Start: 2014-09-15 — End: 2014-09-21
  Administered 2014-09-17 – 2014-09-19 (×2): 650 mg via ORAL
  Filled 2014-09-15 (×2): qty 2

## 2014-09-15 MED ORDER — OXYCODONE HCL 5 MG PO TABS
ORAL_TABLET | ORAL | Status: AC
  Filled 2014-09-15: qty 1

## 2014-09-15 MED ORDER — CALCITRIOL 0.5 MCG PO CAPS
0.5000 ug | ORAL_CAPSULE | ORAL | Status: DC
  Administered 2014-09-16 – 2014-09-21 (×2): 0.5 ug via ORAL
  Filled 2014-09-15 (×4): qty 1

## 2014-09-15 MED ORDER — FENTANYL CITRATE 0.05 MG/ML IJ SOLN
25.0000 ug | INTRAMUSCULAR | Status: DC | PRN
  Administered 2014-09-16 – 2014-09-17 (×3): 25 ug via INTRAVENOUS
  Filled 2014-09-15 (×3): qty 2

## 2014-09-15 MED ORDER — HYDRALAZINE HCL 20 MG/ML IJ SOLN
10.0000 mg | INTRAMUSCULAR | Status: DC | PRN
  Administered 2014-09-15: 10 mg via INTRAVENOUS
  Filled 2014-09-15: qty 1

## 2014-09-15 MED ORDER — OXYCODONE HCL 5 MG PO TABS
5.0000 mg | ORAL_TABLET | ORAL | Status: DC | PRN
  Administered 2014-09-15 – 2014-09-17 (×5): 5 mg via ORAL
  Filled 2014-09-15 (×4): qty 1

## 2014-09-15 MED ORDER — METOPROLOL TARTRATE 25 MG PO TABS
25.0000 mg | ORAL_TABLET | Freq: Two times a day (BID) | ORAL | Status: DC
  Filled 2014-09-15 (×3): qty 1

## 2014-09-15 MED ORDER — BISACODYL 10 MG RE SUPP
10.0000 mg | Freq: Once | RECTAL | Status: AC
  Administered 2014-09-15: 10 mg via RECTAL
  Filled 2014-09-15: qty 1

## 2014-09-15 MED ORDER — NA FERRIC GLUC CPLX IN SUCROSE 12.5 MG/ML IV SOLN
62.5000 mg | INTRAVENOUS | Status: DC
  Administered 2014-09-16: 62.5 mg via INTRAVENOUS
  Filled 2014-09-15 (×2): qty 5

## 2014-09-15 MED ORDER — ROSUVASTATIN CALCIUM 10 MG PO TABS
10.0000 mg | ORAL_TABLET | Freq: Every day | ORAL | Status: DC
  Administered 2014-09-15 – 2014-09-20 (×6): 10 mg via ORAL
  Filled 2014-09-15 (×8): qty 1

## 2014-09-15 MED ORDER — ACETAMINOPHEN 650 MG RE SUPP
650.0000 mg | Freq: Four times a day (QID) | RECTAL | Status: DC | PRN
Start: 2014-09-15 — End: 2014-09-21

## 2014-09-15 MED ORDER — FENTANYL CITRATE 0.05 MG/ML IJ SOLN
25.0000 ug | INTRAMUSCULAR | Status: DC
  Administered 2014-09-15 (×3): 25 ug via INTRAVENOUS
  Filled 2014-09-15 (×3): qty 2

## 2014-09-15 MED ORDER — DEXTROSE-NACL 5-0.45 % IV SOLN: INTRAVENOUS | Status: DC

## 2014-09-15 NOTE — Progress Notes (Signed)
Patient ID: [name], female   DOB: [birth year], 73 y.o.   MRN: [id number]    Subjective: No nausea, but no flatus.  Denies abdominal pain  Objective: Vital signs in last 24 hours: Temp:  [97.4 F (36.3 C)-98.7 F (37.1 C)] 98.4 F (36.9 C) (11/18 0927) Pulse Rate:  [42-252] 42 (11/18 0927) Resp:  [7-23] 16 (11/18 0927) BP: (152-224)/(38-85) 153/38 mmHg (11/18 0927) SpO2:  [88 %-99 %] 95 % (11/18 0927) Weight:  [120 lb 11.2 oz (54.749 kg)-123 lb (55.792 kg)] 120 lb 11.2 oz (54.749 kg) (11/18 0224) Last BM Date: 09/15/14 (smear)  Intake/Output from previous day:   Intake/Output this shift:    PE: Abd: soft, mild diffuse tenderness, +BS, ND  Lab Results:   Recent Labs  09/14/14 1858 09/15/14 0615  WBC 4.7 7.2  HGB 14.1 13.4  HCT 43.2 40.6  PLT 118* 107*   BMET  Recent Labs  09/14/14 1858 09/15/14 0615  NA 137 137  K 5.1 5.8*  CL 90* 90*  CO2 24 24  GLUCOSE 164* 151*  BUN 60* 67*  CREATININE 7.41* 7.78*  CALCIUM 9.4 9.3   PT/INR  Recent Labs  09/15/14 0615  LABPROT 17.0*  INR 1.37   CMP     Component Value Date/Time   NA 137 09/15/2014 0615   NA 142 09/03/2014 1217   K 5.8* 09/15/2014 0615   K 3.3 09/03/2014 1217   CL 90* 09/15/2014 0615   CL 95* 09/03/2014 1217   CO2 24 09/15/2014 0615   CO2 32 09/03/2014 1217   GLUCOSE 151* 09/15/2014 0615   GLUCOSE 129* 09/03/2014 1217   BUN 67* 09/15/2014 0615   BUN 13 09/03/2014 1217   CREATININE 7.78* 09/15/2014 0615   CREATININE 3.6* 09/03/2014 1217   CALCIUM 9.3 09/15/2014 0615   CALCIUM 8.9 09/03/2014 1217   PROT 6.0 09/15/2014 0615   PROT 6.3* 09/03/2014 1217   ALBUMIN 3.3* 09/15/2014 0615   AST 13 09/15/2014 0615   AST 18 09/03/2014 1217   ALT 6 09/15/2014 0615   ALT 13 09/03/2014 1217   ALKPHOS 65 09/15/2014 0615   ALKPHOS 54 09/03/2014 1217   BILITOT 0.4 09/15/2014 0615   BILITOT 0.50 09/03/2014 1217   GFRNONAA 5* 09/15/2014 0615   GFRAA 5* 09/15/2014 0615   Lipase      Component Value Date/Time   LIPASE 33 09/14/2014 1858       Studies/Results: Ct Abdomen Pelvis W Contrast  09/15/2014   CLINICAL DATA:  Bilateral lower quadrant abdominal pain  EXAM: CT ABDOMEN AND PELVIS WITH CONTRAST  TECHNIQUE: Multidetector CT imaging of the abdomen and pelvis was performed using the standard protocol following bolus administration of intravenous contrast.  CONTRAST:  152mL OMNIPAQUE IOHEXOL 300 MG/ML  SOLN  COMPARISON:  07/17/2014  FINDINGS: Lung bases demonstrate bibasilar atelectatic changes. Very minimal pleural fluid is noted on the right. The gallbladder is well distended and demonstrates enhancing gallbladder polyps. These are better visualized than on the prior exam. Fluid attenuation lesion is again noted within the lateral spleen stable from the prior exam. The right adrenal gland is within normal limits. The left adrenal gland again demonstrates a low-density mass with peripheral calcifications stable from the previous exam. The pancreas is unremarkable.  The kidneys again show changes of renal cystic change. No renal calculi or obstructive changes are noted. Delayed images demonstrate normal excretion of contrast. There are dilated loops of distal small bowel with fecalization of small bowel contents. No  definitive transition zone is identified. Correlation with physical exam is recommended as this may represent ileus. No free air is seen. No free fluid is noted. Diffuse aortoiliac calcifications are noted. Scanning into the pelvis reveals the bladder to be well distended. No pelvic mass lesion is noted. Uterus has been surgically removed. The appendix has also been removed. No acute bony abnormality is noted.  IMPRESSION: Multiple dilated loops of mid to distal small bowel without definitive transition zone. The terminal ileum is within normal limits. These changes may be related to adhesions or possibly a small bowel ileus. Correlation with the physical exam is  recommended.  Pole ladder polyp stable from the prior exam.  Stable low-density lesions in the spleen and left adrenal gland. No other new focal abnormality is noted.   Electronically Signed   By: Inez Catalina M.D.   On: 09/15/2014 00:01   Dg Abd Acute W/chest  09/14/2014   CLINICAL DATA:  Initial evaluation for vomiting and diarrhea for 1 week. History of lymphoma.  EXAM: ACUTE ABDOMEN SERIES (ABDOMEN 2 VIEW & CHEST 1 VIEW)  COMPARISON:  Prior radiograph from 09/07/2014.  FINDINGS: Transverse heart size at the upper limits of normal, stable from prior. Mediastinal silhouette within normal limits. Atherosclerotic plaque present within the aortic arch. Right-sided Port-A-Cath stable disc position with tip overlying the cavoatrial junction.  Lungs are normally inflated. No focal infiltrate, pulmonary edema or pleural effusion. No pneumothorax.  Bowel gas pattern within normal limits without evidence of obstruction or ileus. No abnormal bowel wall thickening. No free intraperitoneal air. No soft tissue mass or abnormal calcifications. Surgical clips overlie the right lower quadrant.  Multilevel degenerative changes present throughout the visualized spine, most prevalent at the lumbosacral junction. No acute osseous abnormality.  IMPRESSION: 1. Nonobstructive bowel gas pattern with no radiographic evidence of acute intra-abdominal abnormality. 2. No active cardiopulmonary disease.   Electronically Signed   By: Jeannine Boga M.D.   On: 09/14/2014 20:47    Anti-infectives: Anti-infectives    Start     Dose/Rate Route Frequency Ordered Stop   09/16/14 1800  valACYclovir (VALTREX) tablet 500 mg     500 mg Oral Every T-Th-Sa (1800) 09/15/14 0245         Assessment/Plan  1.  Abdominal pain, ileus vs pSBO 2. Multiple medical problems  Plan: 1. Patient has a large stool ball in her rectum.  Will give her a suppository today to help stimulate a BM and bowel function in general. 2. Repeat abdominal  films tomorrow morning. 3. Will follow.  No need for surgical intervention at this time.   LOS: 1 day    Clair Bardwell E 09/15/2014, 9:46 AM Pager: [phone]

## 2014-09-15 NOTE — Procedures (Signed)
I was present at this dialysis session, have reviewed the session itself and made  appropriate changes  Kelly Splinter MD (pgr) [phone]    ([phone] 09/15/2014, 4:51 PM

## 2014-09-15 NOTE — Consult Note (Signed)
Cardiology Consult Note  Admit date: 09/14/2014 Name: [name] 73 y.o.  female DOB:  Feb 23, 1941 MRN:  [id number]  Today's date:  09/15/2014  Referring Physician:    [facility]  Reason for Consultation:  Second degree heart block  IMPRESSIONS: 1.  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 2.  Chronic diastolic heart failure. 3.  History of atrial fibrillation, currently maintaining sinus rhythm. 4.  Non-Hodgkin's lymphoma preserved to be in remission 5.  Small bowel obstruction  RECOMMENDATION: I would continue to monitor on telemetry and would hold her beta blocker for the time being.  It is not unreasonable to hold the amiodarone and to consider restarting it at a lower dose if her heart rate comes up.  Continue treatment for small bowel obstruction.  She has not been thought to be a good candidate for anticoagulation in the past.  HISTORY: This 73 year old female was seen by me in 2011at which time she was having some paroxysmal atrial fibrillation and was diagnosed with lymphoma. She has been treated with CHOP and was placed on Pradaxa at the time and had catheterization showing only mild to moderate coronary artery disease predominantly involving the circumflex.  She has been taken off of Pradaxa because of difficulty bleeding.  She developed relapse lymphoma and also end-stage renal disease.  She was treated for this with radiation therapy and had a negative bone marrow recently.  She developed supraventricular tachycardia in January of this year and was placed on beta blockers at the time. She developed atrial fibrillation on dialysis in February of this year. At that time she was loaded on amiodarone again.  She has not had recurrence of atrial fibrillation since then.  A recent bone marrow examination was negative for lymphoma.  She was admitted with a small bowel  obstruction and was noted to have some second-degree heart block on telemetry monitoring that has been asymptomatic.  She continues to have abdominal pain and is currently being dialyzed.she has some mild shortness of breath.  No significant chest pain.   Past Medical History  Diagnosis Date  . Hypertension   . Anxiety     h/o anxiety attacks  . Anemia of renal disease   . Claustrophobia   . Panic attacks   . 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 CAD (coronary artery disease)   . Atrial fibrillation   . High cholesterol     hx  . Type 2 diabetes mellitus     "don't take RX now" (11/04/2013)  . Arthritis     "left hip" (11/04/2013)  . ESRD (end stage renal disease) on dialysis 2014    "[employer]; TTS" (11/04/2013)  . Large cell (diffuse) non-Hodgkin's lymphoma dx'd ?2011      Past Surgical History  Procedure Laterality Date  . Appendectomy  2009  . Cardiac catheterization  2011  . Shoulder open rotator cuff repair Right ?2013  . Esophagogastroduodenoscopy (egd) with propofol  10/06/2012    Procedure: ESOPHAGOGASTRODUODENOSCOPY (EGD) WITH PROPOFOL;  Surgeon: Arta Silence, MD;  Location: [facility];  Service: Endoscopy;  Laterality: N/A;  . Colonoscopy with propofol  10/06/2012    Procedure: COLONOSCOPY WITH PROPOFOL;  Surgeon: Arta Silence, MD;  Location: [facility];  Service: Endoscopy;  Laterality: N/A;  . Colonoscopy with propofol N/A 01/14/2013    Procedure: COLONOSCOPY WITH PROPOFOL;  Surgeon: Gwyndolyn Saxon  Paulita Fujita, MD;  Location: [facility];  Service: Endoscopy;  Laterality: N/A;  . Portacath placement Right 01/22/2013  . Av fistula placement Left 07/24/2013    Procedure: ARTERIOVENOUS (AV) FISTULA CREATION- LEFT ;  Surgeon: Angelia Mould, MD;  Location: [place];  Service: Vascular;  Laterality: Left;  . Abdominal hysterectomy  1980's  . Tonsillectomy  1954     Allergies:  is allergic to advicor; crestor; lipitor; lovastatin; morphine  and related; niaspan; nsaids; pravachol; sulfa antibiotics; vytorin; welchol; zetia; and zocor.   Medications: Prior to Admission medications   Medication Sig Start Date End Date Taking? Authorizing Provider  ALPRAZolam Duanne Moron) 0.5 MG tablet Take 0.5 mg by mouth at bedtime as needed for anxiety or sleep.   Yes Historical Provider, MD  amiodarone (PACERONE) 200 MG tablet Take 200 mg by mouth every morning.    Yes Historical Provider, MD  amLODipine (NORVASC) 5 MG tablet Take 1 tablet (5 mg total) by mouth daily. 09/10/14  Yes Verlee Monte, MD  CRESTOR 10 MG tablet Take 10 mg by mouth at bedtime.  05/13/14  Yes Historical Provider, MD  docusate sodium (COLACE) 100 MG capsule Take 100 mg by mouth 2 (two) times daily.   Yes Historical Provider, MD  esomeprazole (NEXIUM) 40 MG capsule Take 40 mg by mouth 2 (two) times daily before a meal.    Yes Historical Provider, MD  famciclovir (FAMVIR) 250 MG tablet Take 250 mg by mouth 3 (three) times a week. Tuesday, Thursday and Saturday   Yes Historical Provider, MD  fentaNYL (DURAGESIC - DOSED MCG/HR) 12 MCG/HR Place 12.5 mcg onto the skin every 3 (three) days as needed (pain).    Yes Historical Provider, MD  fluticasone (FLONASE) 50 MCG/ACT nasal spray Place 2 sprays into both nostrils every morning.    Yes Historical Provider, MD  lactulose (CHRONULAC) 10 GM/15ML solution Take 10 g by mouth 2 (two) times daily as needed for mild constipation.   Yes Historical Provider, MD  lidocaine-prilocaine (EMLA) cream Apply 1 application topically as needed (for port-a-cath access).   Yes Historical Provider, MD  megestrol (MEGACE) 400 MG/10ML suspension Take 20 mLs (800 mg total) by mouth daily. 09/03/14  Yes Volanda Napoleon, MD  metoprolol tartrate (LOPRESSOR) 25 MG tablet Take 1 tablet (25 mg total) by mouth 2 (two) times daily. 11/04/13  Yes Delfina Redwood, MD  olopatadine (PATANOL) 0.1 % ophthalmic solution Place 1 drop into both eyes every morning.    Yes  Historical Provider, MD  prochlorperazine (COMPAZINE) 10 MG tablet Take 0.5-1 tablets (5-10 mg total) by mouth every 6 (six) hours as needed for nausea or vomiting. 03/08/14  Yes Volanda Napoleon, MD  sevelamer carbonate (RENVELA) 800 MG tablet Take 2,400 mg by mouth 3 (three) times daily.   Yes Historical Provider, MD  traMADol (ULTRAM) 50 MG tablet Take 50 mg by mouth every 6 (six) hours as needed for moderate pain.   Yes Historical Provider, MD  famciclovir (FAMVIR) 250 MG tablet TAKE 0.5 TABLET BY MOUTH ON TUESDAY, THURSDAY, AND SATURDAY AFTER DIALYSIS Patient not taking: Reported on 09/14/2014 09/12/14   Volanda Napoleon, MD    Family History: Family Status  Relation Status Death Age  . Mother Deceased     from fall  . Father Deceased     MI  . Sister Alive   . Brother Deceased   . Sister Deceased     Social History:   reports that she quit smoking about  50 years ago. Her smoking use included Cigarettes. She started smoking about 65 years ago. She has a 3.75 pack-year smoking history. She has never used smokeless tobacco. She reports that she does not drink alcohol or use illicit drugs.   History   Social History Narrative    Review of Systems: She has abdominal pain as well as some nausea and vomiting.  She has severe weakness of her lower extremities.  She has had some difficulty with tremors and has really felt poorly.  Other than as noted above the remainder of the review of systems is unremarkable.  Physical Exam: BP 153/38 mmHg  Pulse 42  Temp(Src) 98.4 F (36.9 C) (Oral)  Resp 16  Ht 5\' 2"  (1.575 m)  Wt 54.749 kg (120 lb 11.2 oz)  BMI 22.07 kg/m2  SpO2 95%  General appearance: then, soft spoken, pleasant white female in no acute distress Head: Normocephalic, without obvious abnormality, atraumatic Neck: no adenopathy, no carotid bruit, no JVD and supple, symmetrical, trachea midline Lungs: clear to auscultation bilaterally and Port-A-Cath present in right  anterior chest Heart: regular rate and rhythm, S1, S2 normal, no murmur, click, rub or gallop Abdomen: mild diffuse tenderness Pelvic: deferred Extremities: extremities normal, atraumatic, no cyanosis or edema Pulses: 2+ and symmetric Skin: Skin color, texture, turgor normal. No rashes or lesions Neurologic: Grossly normal  Labs: CBC  Recent Labs  09/14/14 1858 09/15/14 0615  WBC 4.7 7.2  RBC 4.32 4.12  HGB 14.1 13.4  HCT 43.2 40.6  PLT 118* 107*  MCV 100.0 98.5  MCH 32.6 32.5  MCHC 32.6 33.0  RDW 17.9* 18.0*  LYMPHSABS 1.2  --   MONOABS 0.8  --   EOSABS 0.0  --   BASOSABS 0.0  --    CMP   Recent Labs  09/15/14 0615  NA 137  K 5.8*  CL 90*  CO2 24  GLUCOSE 151*  BUN 67*  CREATININE 7.78*  CALCIUM 9.3  PROT 6.0  ALBUMIN 3.3*  AST 13  ALT 6  ALKPHOS 65  BILITOT 0.4  GFRNONAA 5*  GFRAA 5*   BNP (last 3 results)  Recent Labs  11/03/13 1655 09/07/14 0400  PROBNP 6465.0* >70000.0*   Cardiac Panel (last 3 results)  Recent Labs  09/14/14 1858  TROPONINI <0.30     Radiology: No active cardiopulmonary disease  EKG: On admission, sinus rhythm with bifascicular block with right posterior hemiblock and right axis deviation, right bundle branch block.  Signed:  Kerry Hough MD [facility]   Cardiology Consultant  09/15/2014, 11:53 AM

## 2014-09-15 NOTE — H&P (Signed)
[facility] History and Physical  Patient: [name]  VAN:[id number]  DOB: April 21, 1941  DOS: the patient was seen and examined on 09/15/2014 PCP: [name], MD  Chief Complaint: Nausea vomiting and abdominal pain  HPI: [name] is a 73 y.o. female with Past medical history of ESRD on hemodialysis, hypertension, coronary artery disease, A. Fib, and non-Hodgkin's lymphoma. The patient is presenting with complaints ofabdominal pain. She mentions she had abdominal pain and generalized weakness that started 2 weeks ago and in the interim was admitted for hypertensive urgency. After the discharge she continues to have generalized weakness and since this Sunday the patient has been having vomiting. Patient estimated her last EMG was yesterday but it was small and is unable to tell me when was her prior bowel movement was. She mentions she has been taking a stool softener without any significant benefit. She mentions she still urinates but has been having difficulty with urination. She denies any fever or chills denies anyChest pain or shortness of breath. She mentions she is compliant with all her other medications. She was having significant weakness and therefore decided to go for the dialysis and came to the ER.  The patient is coming from home. And at her baseline independent for most of her ADL.  Review of Systems: as mentioned in the history of present illness.  A Comprehensive review of the other systems is negative.  Past Medical History  Diagnosis Date  . Hypertension   . Anxiety     h/o anxiety attacks  . Anemia of renal disease   . Claustrophobia   . Panic attacks   . History of radiation therapy     25  gy to right supraorbital mass  . CAD (coronary artery disease)   . Atrial fibrillation   . High cholesterol     hx  . Type 2 diabetes mellitus     "don't take RX now" (11/04/2013)  . Arthritis     "left hip" (11/04/2013)  . ESRD (end stage renal  disease) on dialysis 2014    "[employer]; TTS" (11/04/2013)  . 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 Appendectomy  2009  . Cardiac catheterization  2011  . Shoulder open rotator cuff repair Right ?2013  . Esophagogastroduodenoscopy (egd) with propofol  10/06/2012    Procedure: ESOPHAGOGASTRODUODENOSCOPY (EGD) WITH PROPOFOL;  Surgeon: Arta Silence, MD;  Location: [facility];  Service: Endoscopy;  Laterality: N/A;  . Colonoscopy with propofol  10/06/2012    Procedure: COLONOSCOPY WITH PROPOFOL;  Surgeon: Arta Silence, MD;  Location: [facility];  Service: Endoscopy;  Laterality: N/A;  . Colonoscopy with propofol N/A 01/14/2013    Procedure: COLONOSCOPY WITH PROPOFOL;  Surgeon: Arta Silence, MD;  Location: [facility];  Service: Endoscopy;  Laterality: N/A;  . Portacath placement Right 01/22/2013  . Av fistula placement Left 07/24/2013    Procedure: ARTERIOVENOUS (AV) FISTULA CREATION- LEFT ;  Surgeon: Angelia Mould, MD;  Location: [place];  Service: Vascular;  Laterality: Left;  . Abdominal hysterectomy  1980's  . Tonsillectomy  1954   Social History:  reports that she quit smoking about 50 years ago. Her smoking use included Cigarettes. She started smoking about 65 years ago. She has a 3.75 pack-year smoking history. She has never used smokeless tobacco. She reports that she does not drink alcohol or use illicit drugs.  Allergies  Allergen Reactions  . Advicor [Niacin-Lovastatin Er]  Muscle cramps  . Crestor [Rosuvastatin]     Muscle cramps  . Lipitor [Atorvastatin]     Muscle cramps  . Lovastatin     Muscle cramps  . Morphine And Related Other (See Comments)    "made me crazy"  . Niaspan [Niacin Er]     Muscle cramps  . Nsaids     Gi side effects  . Pravachol [Pravastatin Sodium]     Gi side effects  . Sulfa Antibiotics Other (See Comments)    Can't remember   . Vytorin  [Ezetimibe-Simvastatin] Nausea Only  . Welchol [Colesevelam Hcl]     Muscle cramps  . Zetia [Ezetimibe]     Muscle cramps   . Zocor [Simvastatin]     Muscle cramps     Family History  Problem Relation Age of Onset  . Cancer Mother     throat  . Alzheimer's disease Sister   . Cancer Brother     lymphoma  . Cancer Sister     unknown type    Prior to Admission medications   Medication Sig Start Date End Date Taking? Authorizing Provider  ALPRAZolam Duanne Moron) 0.5 MG tablet Take 0.5 mg by mouth at bedtime as needed for anxiety or sleep.   Yes Historical Provider, MD  amiodarone (PACERONE) 200 MG tablet Take 200 mg by mouth every morning.    Yes Historical Provider, MD  amLODipine (NORVASC) 5 MG tablet Take 1 tablet (5 mg total) by mouth daily. 09/10/14  Yes Verlee Monte, MD  CRESTOR 10 MG tablet Take 10 mg by mouth at bedtime.  05/13/14  Yes Historical Provider, MD  docusate sodium (COLACE) 100 MG capsule Take 100 mg by mouth 2 (two) times daily.   Yes Historical Provider, MD  esomeprazole (NEXIUM) 40 MG capsule Take 40 mg by mouth 2 (two) times daily before a meal.    Yes Historical Provider, MD  famciclovir (FAMVIR) 250 MG tablet Take 250 mg by mouth 3 (three) times a week. Tuesday, Thursday and Saturday   Yes Historical Provider, MD  fentaNYL (DURAGESIC - DOSED MCG/HR) 12 MCG/HR Place 12.5 mcg onto the skin every 3 (three) days as needed (pain).    Yes Historical Provider, MD  fluticasone (FLONASE) 50 MCG/ACT nasal spray Place 2 sprays into both nostrils every morning.    Yes Historical Provider, MD  lactulose (CHRONULAC) 10 GM/15ML solution Take 10 g by mouth 2 (two) times daily as needed for mild constipation.   Yes Historical Provider, MD  lidocaine-prilocaine (EMLA) cream Apply 1 application topically as needed (for port-a-cath access).   Yes Historical Provider, MD  megestrol (MEGACE) 400 MG/10ML suspension Take 20 mLs (800 mg total) by mouth daily. 09/03/14  Yes Volanda Napoleon, MD   metoprolol tartrate (LOPRESSOR) 25 MG tablet Take 1 tablet (25 mg total) by mouth 2 (two) times daily. 11/04/13  Yes Delfina Redwood, MD  olopatadine (PATANOL) 0.1 % ophthalmic solution Place 1 drop into both eyes every morning.    Yes Historical Provider, MD  prochlorperazine (COMPAZINE) 10 MG tablet Take 0.5-1 tablets (5-10 mg total) by mouth every 6 (six) hours as needed for nausea or vomiting. 03/08/14  Yes Volanda Napoleon, MD  sevelamer carbonate (RENVELA) 800 MG tablet Take 2,400 mg by mouth 3 (three) times daily.   Yes Historical Provider, MD  traMADol (ULTRAM) 50 MG tablet Take 50 mg by mouth every 6 (six) hours as needed for moderate pain.   Yes Historical Provider, MD  famciclovir [facility])  250 MG tablet TAKE 0.5 TABLET BY MOUTH ON TUESDAY, THURSDAY, AND SATURDAY AFTER DIALYSIS Patient not taking: Reported on 09/14/2014 09/12/14   Volanda Napoleon, MD    Physical Exam: Filed Vitals:   09/15/14 0045 09/15/14 0115 09/15/14 0224 09/15/14 0500  BP: 188/62 184/64 193/72 206/73  Pulse: 73 66 73 78  Temp:   98.2 F (36.8 C) 98.4 F (36.9 C)  TempSrc:   Oral Oral  Resp: 22 7 18 18   Height:   5\' 2"  (1.575 m)   Weight:   54.749 kg (120 lb 11.2 oz)   SpO2: 96% 98% 97% 96%    General: Alert, Awake and Oriented to Time, Place and Person. Appear in moderate distress Eyes: PERRL ENT: Oral Mucosa clear moist. Neck: no JVD Cardiovascular: S1 and S2 Present, no Murmur, Peripheral Pulses Present Respiratory: Bilateral Air entry equal and Decreased, Clear to Auscultation, noCrackles, no wheezes Abdomen: Bowel Sound present sluggish, Soft and diffuse tender Skin: non Rash Extremities: no Pedal edema, no calf tenderness Neurologic: Grossly no focal neuro deficit.  Labs on Admission:  CBC:  Recent Labs Lab 09/14/14 1858  WBC 4.7  NEUTROABS 2.6  HGB 14.1  HCT 43.2  MCV 100.0  PLT 118*    CMP     Component Value Date/Time   NA 137 09/14/2014 1858   NA 142 09/03/2014 1217   K  5.1 09/14/2014 1858   K 3.3 09/03/2014 1217   CL 90* 09/14/2014 1858   CL 95* 09/03/2014 1217   CO2 24 09/14/2014 1858   CO2 32 09/03/2014 1217   GLUCOSE 164* 09/14/2014 1858   GLUCOSE 129* 09/03/2014 1217   BUN 60* 09/14/2014 1858   BUN 13 09/03/2014 1217   CREATININE 7.41* 09/14/2014 1858   CREATININE 3.6* 09/03/2014 1217   CALCIUM 9.4 09/14/2014 1858   CALCIUM 8.9 09/03/2014 1217   PROT 6.3 09/14/2014 1858   PROT 6.3* 09/03/2014 1217   ALBUMIN 3.4* 09/14/2014 1858   AST 14 09/14/2014 1858   AST 18 09/03/2014 1217   ALT 7 09/14/2014 1858   ALT 13 09/03/2014 1217   ALKPHOS 69 09/14/2014 1858   ALKPHOS 54 09/03/2014 1217   BILITOT 0.4 09/14/2014 1858   BILITOT 0.50 09/03/2014 1217   GFRNONAA 5* 09/14/2014 1858   GFRAA 6* 09/14/2014 1858     Recent Labs Lab 09/14/14 1858  LIPASE 33   No results for input(s): AMMONIA in the last 168 hours.   Recent Labs Lab 09/14/14 1858  TROPONINI <0.30   BNP (last 3 results)  Recent Labs  11/03/13 1655 09/07/14 0400  PROBNP 6465.0* >70000.0*    Radiological Exams on Admission: Ct Abdomen Pelvis W Contrast  09/15/2014   CLINICAL DATA:  Bilateral lower quadrant abdominal pain  EXAM: CT ABDOMEN AND PELVIS WITH CONTRAST  TECHNIQUE: Multidetector CT imaging of the abdomen and pelvis was performed using the standard protocol following bolus administration of intravenous contrast.  CONTRAST:  191mL OMNIPAQUE IOHEXOL 300 MG/ML  SOLN  COMPARISON:  07/17/2014  FINDINGS: Lung bases demonstrate bibasilar atelectatic changes. Very minimal pleural fluid is noted on the right. The gallbladder is well distended and demonstrates enhancing gallbladder polyps. These are better visualized than on the prior exam. Fluid attenuation lesion is again noted within the lateral spleen stable from the prior exam. The right adrenal gland is within normal limits. The left adrenal gland again demonstrates a low-density mass with peripheral calcifications  stable from the previous exam. The pancreas is unremarkable.  The kidneys again show changes of renal cystic change. No renal calculi or obstructive changes are noted. Delayed images demonstrate normal excretion of contrast. There are dilated loops of distal small bowel with fecalization of small bowel contents. No definitive transition zone is identified. Correlation with physical exam is recommended as this may represent ileus. No free air is seen. No free fluid is noted. Diffuse aortoiliac calcifications are noted. Scanning into the pelvis reveals the bladder to be well distended. No pelvic mass lesion is noted. Uterus has been surgically removed. The appendix has also been removed. No acute bony abnormality is noted.  IMPRESSION: Multiple dilated loops of mid to distal small bowel without definitive transition zone. The terminal ileum is within normal limits. These changes may be related to adhesions or possibly a small bowel ileus. Correlation with the physical exam is recommended.  Pole ladder polyp stable from the prior exam.  Stable low-density lesions in the spleen and left adrenal gland. No other new focal abnormality is noted.   Electronically Signed   By: Inez Catalina M.D.   On: 09/15/2014 00:01   Dg Abd Acute W/chest  09/14/2014   CLINICAL DATA:  Initial evaluation for vomiting and diarrhea for 1 week. History of lymphoma.  EXAM: ACUTE ABDOMEN SERIES (ABDOMEN 2 VIEW & CHEST 1 VIEW)  COMPARISON:  Prior radiograph from 09/07/2014.  FINDINGS: Transverse heart size at the upper limits of normal, stable from prior. Mediastinal silhouette within normal limits. Atherosclerotic plaque present within the aortic arch. Right-sided Port-A-Cath stable disc position with tip overlying the cavoatrial junction.  Lungs are normally inflated. No focal infiltrate, pulmonary edema or pleural effusion. No pneumothorax.  Bowel gas pattern within normal limits without evidence of obstruction or ileus. No abnormal bowel  wall thickening. No free intraperitoneal air. No soft tissue mass or abnormal calcifications. Surgical clips overlie the right lower quadrant.  Multilevel degenerative changes present throughout the visualized spine, most prevalent at the lumbosacral junction. No acute osseous abnormality.  IMPRESSION: 1. Nonobstructive bowel gas pattern with no radiographic evidence of acute intra-abdominal abnormality. 2. No active cardiopulmonary disease.   Electronically Signed   By: Jeannine Boga M.D.   On: 09/14/2014 20:47    Assessment/Plan Principal Problem:   SBO (small bowel obstruction) Active Problems:   Large cell (diffuse) non-Hodgkin's lymphoma   Anemia of renal disease   ESRD on hemodialysis   Atrial fibrillation   CAD (coronary artery disease)   1. SBO (small bowel obstruction)  the patient is presenting with complaints of abdominal pain associated with nausea and vomiting.she may have constipation as well. She has missed her hemodialysis today. CT of the abdomen was showing possible small bowel obstruction versus ileus without any transition point. Gen. Surgery has been consulted for the patient will be following up with the patient. At present I will keep the patient nothing by mouth except medications and give herIV Zofran as needed. Further management will be based on surgery recommendation Pain management will be withcombination of oral OxyIR and IV fentanyl  2.ESRD on hemodialysis Nephrology will be consulted for continuation of hemodialysis. At present she does not appear to have any acute need for hemodialysis.  3.. NHL. Patient is on famciclovir at home which I would continue him  4.Accelerated hypertension. Continuing home medications. Pain management will likely improve her blood pressure as well as hemodialysis.  5. A. Fib. Continue amiodarone  Advance goals of care discussion: Full code   Consults: general surgery  DVT Prophylaxis: subcutaneous  Heparin Nutrition: npo  Disposition: Admitted to inpatient in telemetry unit.  Author: Berle Mull, MD [facility] Pager: [phone] 09/15/2014, 7:04 AM    If 7PM-7AM, please contact night-coverage [email or web address] Password [email or web address]

## 2014-09-15 NOTE — Consult Note (Addendum)
Reason for Consult:SBO vs Ileus Referring Physician: Dr. Marlowe Sax  [name] is an 73 y.o. female.  HPI: we have been asked to evaluate this patient for abdominal pain and possible small bowel obstruction. She has had a partial obstruction over a year ago which resolved with nasogastric suctioning. She started having abdominal pain after dialysis today. She had recently been in the hospital for hypertension. She reports crampy abdominal pain. She had nausea and some vomiting but none today. She is uncertain of the last time of her bowel movement but had been having some loose stools. She has passed a small amount of flatus. The pain is described as cramping, in the lower abdomen, and moderate in intensity. She has a lot of medical issues.  Past Medical History  Diagnosis Date  . Hypertension   . Anxiety     h/o anxiety attacks  . Anemia of renal disease   . Claustrophobia   . Panic attacks   . 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 CAD (coronary artery disease)   . Atrial fibrillation   . High cholesterol     hx  . Type 2 diabetes mellitus     "don't take RX now" (11/04/2013)  . Arthritis     "left hip" (11/04/2013)  . ESRD (end stage renal disease) on dialysis 2014    "[facility]; TTS" (11/04/2013)  . Large cell (diffuse) non-Hodgkin's lymphoma dx'd ?2011    Past Surgical History  Procedure Laterality Date  . Appendectomy  2009  . Cardiac catheterization  2011  . Shoulder open rotator cuff repair Right ?2013  . Esophagogastroduodenoscopy (egd) with propofol  10/06/2012    Procedure: ESOPHAGOGASTRODUODENOSCOPY (EGD) WITH PROPOFOL;  Surgeon: Arta Silence, MD;  Location: [facility];  Service: Endoscopy;  Laterality: N/A;  . Colonoscopy with propofol  10/06/2012    Procedure: COLONOSCOPY WITH PROPOFOL;  Surgeon: Arta Silence, MD;  Location: [facility];  Service: Endoscopy;  Laterality: N/A;  . Colonoscopy with propofol N/A 01/14/2013     Procedure: COLONOSCOPY WITH PROPOFOL;  Surgeon: Arta Silence, MD;  Location: [facility];  Service: Endoscopy;  Laterality: N/A;  . Portacath placement Right 01/22/2013  . Av fistula placement Left 07/24/2013    Procedure: ARTERIOVENOUS (AV) FISTULA CREATION- LEFT ;  Surgeon: Angelia Mould, MD;  Location: [facility];  Service: Vascular;  Laterality: Left;  . Abdominal hysterectomy  1980's  . Tonsillectomy  1954    Family History  Problem Relation Age of Onset  . Cancer Mother     throat  . Alzheimer's disease Sister   . Cancer Brother     lymphoma  . Cancer Sister     unknown type    Social History:  reports that she quit smoking about 50 years ago. Her smoking use included Cigarettes. She started smoking about 65 years ago. She has a 3.75 pack-year smoking history. She has never used smokeless tobacco. She reports that she does not drink alcohol or use illicit drugs.  Allergies:  Allergies  Allergen Reactions  . Advicor [Niacin-Lovastatin Er]     Muscle cramps  . Crestor [Rosuvastatin]     Muscle cramps  . Lipitor [Atorvastatin]     Muscle cramps  . Lovastatin     Muscle cramps  . Morphine And Related Other (See Comments)    "made me crazy"  . Niaspan [Niacin Er]     Muscle cramps  . Nsaids  Gi side effects  . Pravachol [Pravastatin Sodium]     Gi side effects  . Sulfa Antibiotics Other (See Comments)    Can't remember   . Vytorin [Ezetimibe-Simvastatin] Nausea Only  . Welchol [Colesevelam Hcl]     Muscle cramps  . Zetia [Ezetimibe]     Muscle cramps   . Zocor [Simvastatin]     Muscle cramps     Medications: I have reviewed the patient's current medications.  Results for orders placed or performed during the hospital encounter of 09/14/14 (from the past 48 hour(s))  CBC with Differential     Status: Abnormal   Collection Time: 09/14/14  6:58 PM  Result Value Ref Range   WBC 4.7 4.0 - 10.5 K/uL   RBC 4.32 3.87 - 5.11 MIL/uL   Hemoglobin 14.1 12.0 -  15.0 g/dL   HCT 43.2 36.0 - 46.0 %   MCV 100.0 78.0 - 100.0 fL   MCH 32.6 26.0 - 34.0 pg   MCHC 32.6 30.0 - 36.0 g/dL   RDW 17.9 (H) 11.5 - 15.5 %   Platelets 118 (L) 150 - 400 K/uL    Comment: REPEATED TO VERIFY SPECIMEN CHECKED FOR CLOTS PLATELET COUNT CONFIRMED BY SMEAR    Neutrophils Relative % 56 43 - 77 %   Neutro Abs 2.6 1.7 - 7.7 K/uL   Lymphocytes Relative 26 12 - 46 %   Lymphs Abs 1.2 0.7 - 4.0 K/uL   Monocytes Relative 17 (H) 3 - 12 %   Monocytes Absolute 0.8 0.1 - 1.0 K/uL   Eosinophils Relative 0 0 - 5 %   Eosinophils Absolute 0.0 0.0 - 0.7 K/uL   Basophils Relative 0 0 - 1 %   Basophils Absolute 0.0 0.0 - 0.1 K/uL  Comprehensive metabolic panel     Status: Abnormal   Collection Time: 09/14/14  6:58 PM  Result Value Ref Range   Sodium 137 137 - 147 mEq/L   Potassium 5.1 3.7 - 5.3 mEq/L   Chloride 90 (L) 96 - 112 mEq/L   CO2 24 19 - 32 mEq/L   Glucose, Bld 164 (H) 70 - 99 mg/dL   BUN 60 (H) 6 - 23 mg/dL   Creatinine, Ser 7.41 (H) 0.50 - 1.10 mg/dL   Calcium 9.4 8.4 - 10.5 mg/dL   Total Protein 6.3 6.0 - 8.3 g/dL   Albumin 3.4 (L) 3.5 - 5.2 g/dL   AST 14 0 - 37 U/L   ALT 7 0 - 35 U/L   Alkaline Phosphatase 69 39 - 117 U/L   Total Bilirubin 0.4 0.3 - 1.2 mg/dL   GFR calc non Af Amer 5 (L) >90 mL/min   GFR calc Af Amer 6 (L) >90 mL/min    Comment: (NOTE) The eGFR has been calculated using the CKD EPI equation. This calculation has not been validated in all clinical situations. eGFR's persistently <90 mL/min signify possible Chronic Kidney Disease.    Anion gap 23 (H) 5 - 15  Lipase, blood     Status: None   Collection Time: 09/14/14  6:58 PM  Result Value Ref Range   Lipase 33 11 - 59 U/L  Troponin I     Status: None   Collection Time: 09/14/14  6:58 PM  Result Value Ref Range   Troponin I <0.30 <0.30 ng/mL    Comment:        Due to the release kinetics of cTnI, a negative result within the first hours of the onset  of symptoms does not rule  out myocardial infarction with certainty. If myocardial infarction is still suspected, repeat the test at appropriate intervals.   Urinalysis, Routine w reflex microscopic     Status: Abnormal   Collection Time: 09/14/14  9:21 PM  Result Value Ref Range   Color, Urine YELLOW YELLOW   APPearance HAZY (A) CLEAR   Specific Gravity, Urine 1.015 1.005 - 1.030   pH 7.5 5.0 - 8.0   Glucose, UA 250 (A) NEGATIVE mg/dL   Hgb urine dipstick MODERATE (A) NEGATIVE   Bilirubin Urine NEGATIVE NEGATIVE   Ketones, ur 15 (A) NEGATIVE mg/dL   Protein, ur 100 (A) NEGATIVE mg/dL   Urobilinogen, UA 0.2 0.0 - 1.0 mg/dL   Nitrite NEGATIVE NEGATIVE   Leukocytes, UA MODERATE (A) NEGATIVE  Urine microscopic-add on     Status: Abnormal   Collection Time: 09/14/14  9:21 PM  Result Value Ref Range   Squamous Epithelial / LPF RARE RARE   WBC, UA 21-50 <3 WBC/hpf   RBC / HPF 0-2 <3 RBC/hpf   Bacteria, UA FEW (A) RARE    Ct Abdomen Pelvis W Contrast  09/15/2014   CLINICAL DATA:  Bilateral lower quadrant abdominal pain  EXAM: CT ABDOMEN AND PELVIS WITH CONTRAST  TECHNIQUE: Multidetector CT imaging of the abdomen and pelvis was performed using the standard protocol following bolus administration of intravenous contrast.  CONTRAST:  153m OMNIPAQUE IOHEXOL 300 MG/ML  SOLN  COMPARISON:  07/17/2014  FINDINGS: Lung bases demonstrate bibasilar atelectatic changes. Very minimal pleural fluid is noted on the right. The gallbladder is well distended and demonstrates enhancing gallbladder polyps. These are better visualized than on the prior exam. Fluid attenuation lesion is again noted within the lateral spleen stable from the prior exam. The right adrenal gland is within normal limits. The left adrenal gland again demonstrates a low-density mass with peripheral calcifications stable from the previous exam. The pancreas is unremarkable.  The kidneys again show changes of renal cystic change. No renal calculi or obstructive  changes are noted. Delayed images demonstrate normal excretion of contrast. There are dilated loops of distal small bowel with fecalization of small bowel contents. No definitive transition zone is identified. Correlation with physical exam is recommended as this may represent ileus. No free air is seen. No free fluid is noted. Diffuse aortoiliac calcifications are noted. Scanning into the pelvis reveals the bladder to be well distended. No pelvic mass lesion is noted. Uterus has been surgically removed. The appendix has also been removed. No acute bony abnormality is noted.  IMPRESSION: Multiple dilated loops of mid to distal small bowel without definitive transition zone. The terminal ileum is within normal limits. These changes may be related to adhesions or possibly a small bowel ileus. Correlation with the physical exam is recommended.  Pole ladder polyp stable from the prior exam.  Stable low-density lesions in the spleen and left adrenal gland. No other new focal abnormality is noted.   Electronically Signed   By: MInez CatalinaM.D.   On: 09/15/2014 00:01   Dg Abd Acute W/chest  09/14/2014   CLINICAL DATA:  Initial evaluation for vomiting and diarrhea for 1 week. History of lymphoma.  EXAM: ACUTE ABDOMEN SERIES (ABDOMEN 2 VIEW & CHEST 1 VIEW)  COMPARISON:  Prior radiograph from 09/07/2014.  FINDINGS: Transverse heart size at the upper limits of normal, stable from prior. Mediastinal silhouette within normal limits. Atherosclerotic plaque present within the aortic arch. Right-sided Port-A-Cath stable disc position with  tip overlying the cavoatrial junction.  Lungs are normally inflated. No focal infiltrate, pulmonary edema or pleural effusion. No pneumothorax.  Bowel gas pattern within normal limits without evidence of obstruction or ileus. No abnormal bowel wall thickening. No free intraperitoneal air. No soft tissue mass or abnormal calcifications. Surgical clips overlie the right lower quadrant.   Multilevel degenerative changes present throughout the visualized spine, most prevalent at the lumbosacral junction. No acute osseous abnormality.  IMPRESSION: 1. Nonobstructive bowel gas pattern with no radiographic evidence of acute intra-abdominal abnormality. 2. No active cardiopulmonary disease.   Electronically Signed   By: Jeannine Boga M.D.   On: 09/14/2014 20:47    Review of Systems  All other systems reviewed and are negative.  Blood pressure 193/72, pulse 73, temperature 98.2 F (36.8 C), temperature source Oral, resp. rate 18, height 5' 2"  (1.575 m), weight 120 lb 11.2 oz (54.749 kg), SpO2 97 %. Physical Exam  Constitutional: She appears well-developed. No distress.  Thin female  HENT:  Head: Normocephalic and atraumatic.  Right Ear: External ear normal.  Left Ear: External ear normal.  Nose: Nose normal.  Mouth/Throat: Oropharynx is clear and moist. No oropharyngeal exudate.  Eyes: Conjunctivae are normal. Pupils are equal, round, and reactive to light. Right eye exhibits no discharge. Left eye exhibits no discharge. No scleral icterus.  Neck: Normal range of motion. No tracheal deviation present.  Cardiovascular: Intact distal pulses.   Murmur heard. Irregular rhythm  Respiratory: Effort normal and breath sounds normal. No respiratory distress. She has no wheezes.  GI: Soft. She exhibits no distension. There is tenderness. There is guarding. There is no rebound.  Her abdomen is soft and nondistended. There are no hernias. There is tenderness with guarding which is mild in the bilateral lower quadrants  Musculoskeletal: Normal range of motion. She exhibits no edema or tenderness.  Lymphadenopathy:    She has no cervical adenopathy.  Neurological:  Sleepy arousable and following commands  Skin: Skin is warm and dry. No erythema. No pallor.  Psychiatric: Her behavior is normal.    Assessment/Plan: Small bowel obstruction versus ileus  Currently, she does not  have a nasogastric tube placed. She does not have vomiting. If her pain persists, however, she may need a tube placed. The CT scan does not show a transition point so this may just represent ileus secondary to her comorbidities. We will continue bowel rest and IV rehydration for now and repeat her abdominal x-rays.  Mukesh Kornegay A 09/15/2014, 5:28 AM

## 2014-09-15 NOTE — ED Notes (Signed)
Pt transported to [facility] room 11, transported by [place] [facility]

## 2014-09-15 NOTE — ED Notes (Signed)
[phone] - angie cockman daughter

## 2014-09-15 NOTE — Progress Notes (Signed)
Paged Dr. Algis Liming t/o hold amiodarone this am dose.

## 2014-09-15 NOTE — Consult Note (Signed)
Indication for Consultation:  Management of ESRD/hemodialysis; anemia, hypertension/volume and secondary hyperparathyroidism  HPI: [name] is a 73 y.o. female admitted yesterday for compaints of abdominal pain, nausea and vomiting. She recieves HD TTS at [facility], last HD Saturday. She has a an extensive medical history including HTN, CAD, lymphoma, afib, and anxiety. She was admitted last week for hypertensive emergency. She reports generalized weakness and fatigue for a few weeks, per her daughter she was vomiting on "Sunday and started to have severe cramping abdominal pain yesterday, she was unable to go to HD because of pain and presented to the ED for evaluation, she does have a history of bowel obstruction. She is very anxious and saying [place], palliative care consulted for goals of care and code status changed to DNR. Will arrange HD today  Chart review: 08 - atypical CP, GERD, HTN, DM, hx CAD 2/11 - new dx NHL, s/p CHOP, fevers d/t lymphoma, acute hypoxic resp failure, atypical CP, pancytopenia d/t chemo, AKI resolved 8/11 - rapid afib, HTN, NHL w recent chemoRx, Hl, DM not on insulin 6/12 - NSTEMI , parox afib, NIDDM, HTN heart disease, diast HF, HL, non_hodgkin lymphoma in remission > heart cath non obstruct disease 7/12 - hypotension d/t meds, NIDDM, hx NHL in remission 3/14 - relapsed non-Hodgkin lymphoma, HTN, N/V > ileocecal mass biopsy + for B-cell lymphoma, started chemo with ICE-R 4/14 - AKI d/t ATN, large cell nonHodgkin lymphoma > required temp HD then renal function improved, febrile neutropenia rx with AB, pancytopenia rx with neulasta, improving. AFib/RVR, pradaxa stopped due to low plts.   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   BB continued. Transfused for anemia, pt was weak. Too high risk for anticoagulation. S/P 3 cycles gemcitabine/oxaliplatin/ Rituxan, still getting active chemo.  2/21 - 12/21/13 > afib w RVR rx with amio , BB, not candidate for anticoag d/t bleeding risks/pancytopenia.  ESRD on HD, DM2. No mention of active chemo, following with oncology 11/10 - 09/10/14 > HA, high BP's in ED > HTN urgency rx with IV BP meds, transitioned to po meds.  ESRD on HD. L arm AVF.  Large cell diffuse lymphoma, had BM bx Aug 25, 2014, f/b oncology.   Past Medical History  Diagnosis Date  . Hypertension   . Anxiety     h/o anxiety attacks  . Anemia of renal disease   . Claustrophobia   . Panic attacks   . 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 CAD (coronary artery disease)   . Atrial fibrillation   . High cholesterol     hx  . Type 2 diabetes mellitus     "don't take RX now" (11/04/2013)  . Arthritis     "left hip" (11/04/2013)  . ESRD (end stage renal disease) on dialysis 2014    "[facility]; TTS" (11/04/2013)  . 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 Appendectomy  2009  . Cardiac catheterization  2011  . Shoulder open rotator cuff repair Right ?2013  . Esophagogastroduodenoscopy (egd) with propofol  10/06/2012    Procedure: ESOPHAGOGASTRODUODENOSCOPY (EGD) WITH PROPOFOL;  Surgeon: Arta Silence, MD;  Location: [facility];  Service: Endoscopy;  Laterality: N/A;  . Colonoscopy with propofol  10/06/2012    Procedure: COLONOSCOPY WITH PROPOFOL;  Surgeon: Arta Silence, MD;  Location: [facility];  Service: Endoscopy;  Laterality: N/A;  . Colonoscopy with propofol N/A 01/14/2013    Procedure: COLONOSCOPY WITH PROPOFOL;  Surgeon: Arta Silence, MD;  Location: [facility];  Service: Endoscopy;  Laterality: N/A;  . Portacath placement Right 01/22/2013  . Av fistula placement Left 07/24/2013    Procedure:  ARTERIOVENOUS (AV) FISTULA CREATION- LEFT ;  Surgeon: Angelia Mould, MD;  Location: [place] [place];  Service: Vascular;  Laterality: Left;  . Abdominal hysterectomy  1980's  . Tonsillectomy  1954   Family History  Problem Relation Age of Onset  . Cancer Mother     throat  . Alzheimer's disease Sister   . Cancer Brother     lymphoma  . Cancer Sister     unknown type   Social History:  reports that she quit smoking about 50 years ago. Her smoking use included Cigarettes. She started smoking about 65 years ago. She has a 3.75 pack-year smoking history. She has never used smokeless tobacco. She reports that she does not drink alcohol or use illicit drugs. Allergies  Allergen Reactions  . Advicor [Niacin-Lovastatin Er]     Muscle cramps  . Crestor [Rosuvastatin]     Muscle cramps  . Lipitor [Atorvastatin]     Muscle cramps  . Lovastatin     Muscle cramps  . Morphine And Related Other (See Comments)    "made me crazy"  . Niaspan [Niacin Er]     Muscle cramps  . Nsaids     Gi side effects  . Pravachol [Pravastatin Sodium]     Gi side effects  . Sulfa Antibiotics Other (See Comments)    Can't remember   . Vytorin [Ezetimibe-Simvastatin] Nausea Only  . Welchol [Colesevelam Hcl]     Muscle cramps  . Zetia [Ezetimibe]     Muscle cramps   . Zocor [Simvastatin]     Muscle cramps    Prior to Admission medications   Medication Sig Start Date End Date Taking? Authorizing Provider  ALPRAZolam Duanne Moron) 0.5 MG tablet Take 0.5 mg by mouth at bedtime as needed for anxiety or sleep.   Yes Historical Provider, MD  amiodarone (PACERONE) 200 MG tablet Take 200 mg by mouth every morning.    Yes Historical Provider, MD  amLODipine (NORVASC) 5 MG tablet Take 1 tablet (5 mg total) by mouth daily. 09/10/14  Yes Verlee Monte, MD  CRESTOR 10 MG tablet Take 10 mg by mouth at bedtime.  05/13/14  Yes Historical Provider, MD  docusate sodium (COLACE) 100 MG capsule Take 100 mg by mouth 2 (two) times  daily.   Yes Historical Provider, MD  esomeprazole (NEXIUM) 40 MG capsule Take 40 mg by mouth 2 (two) times daily before a meal.    Yes Historical Provider, MD  famciclovir (FAMVIR) 250 MG tablet Take 250 mg by mouth 3 (three) times a week. Tuesday, Thursday and Saturday   Yes Historical Provider, MD  fentaNYL (DURAGESIC - DOSED MCG/HR) 12 MCG/HR Place 12.5 mcg onto the skin every 3 (three) days as needed (pain).    Yes Historical Provider, MD  fluticasone (FLONASE) 50 MCG/ACT nasal spray Place 2 sprays into both nostrils every morning.    Yes Historical Provider, MD  lactulose (CHRONULAC) 10 GM/15ML solution Take 10 g by mouth 2 (two)  times daily as needed for mild constipation.   Yes Historical Provider, MD  lidocaine-prilocaine (EMLA) cream Apply 1 application topically as needed (for port-a-cath access).   Yes Historical Provider, MD  megestrol (MEGACE) 400 MG/10ML suspension Take 20 mLs (800 mg total) by mouth daily. 09/03/14  Yes Volanda Napoleon, MD  metoprolol tartrate (LOPRESSOR) 25 MG tablet Take 1 tablet (25 mg total) by mouth 2 (two) times daily. 11/04/13  Yes Delfina Redwood, MD  olopatadine (PATANOL) 0.1 % ophthalmic solution Place 1 drop into both eyes every morning.    Yes Historical Provider, MD  prochlorperazine (COMPAZINE) 10 MG tablet Take 0.5-1 tablets (5-10 mg total) by mouth every 6 (six) hours as needed for nausea or vomiting. 03/08/14  Yes Volanda Napoleon, MD  sevelamer carbonate (RENVELA) 800 MG tablet Take 2,400 mg by mouth 3 (three) times daily.   Yes Historical Provider, MD  traMADol (ULTRAM) 50 MG tablet Take 50 mg by mouth every 6 (six) hours as needed for moderate pain.   Yes Historical Provider, MD  famciclovir (FAMVIR) 250 MG tablet TAKE 0.5 TABLET BY MOUTH ON TUESDAY, THURSDAY, AND SATURDAY AFTER DIALYSIS Patient not taking: Reported on 09/14/2014 09/12/14   Volanda Napoleon, MD   Current Facility-Administered Medications  Medication Dose Route Frequency Provider Last  Rate Last Dose  . acetaminophen (TYLENOL) tablet 650 mg  650 mg Oral Q6H PRN Berle Mull, MD       Or  . acetaminophen (TYLENOL) suppository 650 mg  650 mg Rectal Q6H PRN Berle Mull, MD      . ALPRAZolam Duanne Moron) tablet 0.5 mg  0.5 mg Oral QHS PRN Berle Mull, MD      . amiodarone (PACERONE) tablet 200 mg  200 mg Oral BH-q7a Modena Jansky, MD   200 mg at 09/15/14 0854  . amLODipine (NORVASC) tablet 5 mg  5 mg Oral Daily Berle Mull, MD      . fentaNYL (Vina - dosed mcg/hr) 12.5 mcg  12.5 mcg Transdermal Q72H PRN Berle Mull, MD      . fentaNYL (SUBLIMAZE) injection 25 mcg  25 mcg Intravenous Q3H Berle Mull, MD   25 mcg at 09/15/14 0249  . fluticasone (FLONASE) 50 MCG/ACT nasal spray 2 spray  2 spray Each Nare BH-q7a Berle Mull, MD      . heparin injection 5,000 Units  5,000 Units Subcutaneous 3 times per day Berle Mull, MD      . hydrALAZINE (APRESOLINE) injection 10 mg  10 mg Intravenous Q4H PRN Modena Jansky, MD   10 mg at 09/15/14 7829  . ondansetron (ZOFRAN) 8 mg/NS 50 ml IVPB  8 mg Intravenous Q6H PRN Berle Mull, MD      . ondansetron (ZOFRAN) injection 4 mg  4 mg Intravenous Q6H PRN Berle Mull, MD   4 mg at 09/15/14 0249  . oxyCODONE (Oxy IR/ROXICODONE) immediate release tablet 5 mg  5 mg Oral Q4H PRN Berle Mull, MD      . pantoprazole (PROTONIX) EC tablet 40 mg  40 mg Oral Daily Berle Mull, MD      . rosuvastatin (CRESTOR) tablet 10 mg  10 mg Oral QHS Berle Mull, MD      . sevelamer carbonate (RENVELA) tablet 2,400 mg  2,400 mg Oral TID WC Berle Mull, MD   2,400 mg at 09/15/14 0850  . traMADol (ULTRAM) tablet 50 mg  50 mg Oral Q6H PRN Berle Mull, MD      . Derrill Memo ON  09/16/2014] valACYclovir (VALTREX) tablet 500 mg  500 mg Oral Q T,Th,Sat-1800 Berle Mull, MD       Labs: Basic Metabolic Panel:  Recent Labs Lab 09/09/14 0332 09/14/14 1858 09/15/14 0615  NA 141 137 137  K 3.6* 5.1 5.8*  CL 99 90* 90*  CO2 26 24 24   GLUCOSE 134* 164* 151*  BUN  27* 60* 67*  CREATININE 5.55* 7.41* 7.78*  CALCIUM 8.6 9.4 9.3   Liver Function Tests:  Recent Labs Lab 09/14/14 1858 09/15/14 0615  AST 14 13  ALT 7 6  ALKPHOS 69 65  BILITOT 0.4 0.4  PROT 6.3 6.0  ALBUMIN 3.4* 3.3*    Recent Labs Lab 09/14/14 1858  LIPASE 33   No results for input(s): AMMONIA in the last 168 hours. CBC:  Recent Labs Lab 09/14/14 1858 09/15/14 0615  WBC 4.7 7.2  NEUTROABS 2.6  --   HGB 14.1 13.4  HCT 43.2 40.6  MCV 100.0 98.5  PLT 118* 107*   Cardiac Enzymes:  Recent Labs Lab 09/14/14 1858  TROPONINI <0.30   CBG:  Recent Labs Lab 09/15/14 0817  GLUCAP 165*   Iron Studies: No results for input(s): IRON, TIBC, TRANSFERRIN, FERRITIN in the last 72 hours. Studies/Results: Ct Abdomen Pelvis W Contrast  09/15/2014   CLINICAL DATA:  Bilateral lower quadrant abdominal pain  EXAM: CT ABDOMEN AND PELVIS WITH CONTRAST  TECHNIQUE: Multidetector CT imaging of the abdomen and pelvis was performed using the standard protocol following bolus administration of intravenous contrast.  CONTRAST:  128mL OMNIPAQUE IOHEXOL 300 MG/ML  SOLN  COMPARISON:  07/17/2014  FINDINGS: Lung bases demonstrate bibasilar atelectatic changes. Very minimal pleural fluid is noted on the right. The gallbladder is well distended and demonstrates enhancing gallbladder polyps. These are better visualized than on the prior exam. Fluid attenuation lesion is again noted within the lateral spleen stable from the prior exam. The right adrenal gland is within normal limits. The left adrenal gland again demonstrates a low-density mass with peripheral calcifications stable from the previous exam. The pancreas is unremarkable.  The kidneys again show changes of renal cystic change. No renal calculi or obstructive changes are noted. Delayed images demonstrate normal excretion of contrast. There are dilated loops of distal small bowel with fecalization of small bowel contents. No definitive  transition zone is identified. Correlation with physical exam is recommended as this may represent ileus. No free air is seen. No free fluid is noted. Diffuse aortoiliac calcifications are noted. Scanning into the pelvis reveals the bladder to be well distended. No pelvic mass lesion is noted. Uterus has been surgically removed. The appendix has also been removed. No acute bony abnormality is noted.  IMPRESSION: Multiple dilated loops of mid to distal small bowel without definitive transition zone. The terminal ileum is within normal limits. These changes may be related to adhesions or possibly a small bowel ileus. Correlation with the physical exam is recommended.  Pole ladder polyp stable from the prior exam.  Stable low-density lesions in the spleen and left adrenal gland. No other new focal abnormality is noted.   Electronically Signed   By: Inez Catalina M.D.   On: 09/15/2014 00:01   Dg Abd Acute W/chest  09/14/2014   CLINICAL DATA:  Initial evaluation for vomiting and diarrhea for 1 week. History of lymphoma.  EXAM: ACUTE ABDOMEN SERIES (ABDOMEN 2 VIEW & CHEST 1 VIEW)  COMPARISON:  Prior radiograph from 09/07/2014.  FINDINGS: Transverse heart size at the upper limits of  normal, stable from prior. Mediastinal silhouette within normal limits. Atherosclerotic plaque present within the aortic arch. Right-sided Port-A-Cath stable disc position with tip overlying the cavoatrial junction.  Lungs are normally inflated. No focal infiltrate, pulmonary edema or pleural effusion. No pneumothorax.  Bowel gas pattern within normal limits without evidence of obstruction or ileus. No abnormal bowel wall thickening. No free intraperitoneal air. No soft tissue mass or abnormal calcifications. Surgical clips overlie the right lower quadrant.  Multilevel degenerative changes present throughout the visualized spine, most prevalent at the lumbosacral junction. No acute osseous abnormality.  IMPRESSION: 1. Nonobstructive bowel  gas pattern with no radiographic evidence of acute intra-abdominal abnormality. 2. No active cardiopulmonary disease.   Electronically Signed   By: Jeannine Boga M.D.   On: 09/14/2014 20:47    Review of Systems: Gen: Fatigue, weakness, poor appetite HEENT: No visual complaints, No history of Retinopathy. Normal external appearance No Epistaxis or Sore throat. No sinusitis.   CV: Denies chest pain, angina, palpitations, syncope, orthopnea, PND, peripheral edema, and claudication. Resp: Reports dyspnea with exercise, cough, Denies dyspnea at rest, sputum, wheezing, coughing up blood, and pleurisy. GI: Reports nausea and vomiting. Cramping abdominal pain GU : Denies urinary burning, blood in urine, urinary frequency, urinary hesitancy, nocturnal urination, and urinary incontinence.  No renal calculi. MS: Reports pain all over Derm: Denies rash, itching, dry skin, hives, moles, warts, or unhealing ulcers.  Psych: anxiety Heme: Denies bruising, bleeding, and enlarged lymph nodes. Neuro: No headache.  No diplopia. No dysarthria.  No dysphasia.  No history of CVA.  No Seizures. No paresthesias.  No weakness. Endocrine No DM.  No Thyroid disease.  No Adrenal disease.  Physical Exam: Filed Vitals:   09/15/14 0115 09/15/14 0224 09/15/14 0500 09/15/14 0800  BP: 184/64 193/72 206/73 212/58  Pulse: 66 73 78 42  Temp:  98.2 F (36.8 C) 98.4 F (36.9 C)   TempSrc:  Oral Oral   Resp: 7 18 18    Height:  5\' 2"  (1.575 m)    Weight:  54.749 kg (120 lb 11.2 oz)    SpO2: 98% 97% 96%      General: Well developed, well nourished. Anxious, moderate distress. Daughter at bedside Head: Normocephalic, atraumatic, sclera non-icteric, mucus membranes are moist Neck: Supple. JVD not elevated. Lungs: Clear bilaterally to auscultation without wheezes, rales, or rhonchi. Breathing is unlabored. Heart: irregular  Abdomen: Soft, tender lower quadrants.  M-S:  Strength and tone appear normal for age. L arm  tremor Lower extremities: without edema or ischemic changes, no open wounds  Neuro: Alert and oriented X 3. Moves all extremities spontaneously. Psych:  Responds to questions appropriately with a normal affect. Dialysis Access: L AVF +b/t  Dialysis Orders:  TTS @ [facility]  54kgs     2K/2Ca     4 hr     3500 heparin    L AVF    400/800 Calcitriol 0.50 mcg    Aranesp 200 q week     Venofer 50 q week  Assessment/Plan: 1.  Abdominal pain- with n/v.possible ileus on CT. Hx bowel obstruction. Trauma surgery consulted- cont bowel rest 2.  ESRD -  TTS @ [facility]. Missed yesterday. K+ 5.8 HD pending today 3.  Hypertension/volume  - 153/38- SBP over 200 on admission. Poor appetite, edw recently lowered, likely continues to lose wt but no volume excess 4. Afib - amiodarone and metop currently on hold for bradycardia 5.  Anemia  - hgb 13.4- hold aranesp. Cont weekly Fe.  Watch CBC- baseline hgb outpt 8-9s 6.  Metabolic bone disease -  ZO+1.0. Not tolerating renvela, will need a different binder when diet advanced. Cont calcitriol. 7.  Nutrition - Alb 3.3. NPO 8. Anxiety- xanax 9. Goal of care- palliative care consulted.   Shelle Iron, NP [facility] [phone] 09/15/2014, 9:16 AM   Pt seen, examined and agree w A/P as above.  Kelly Splinter MD pager [phone]    cell [phone] 09/15/2014, 12:39 PM

## 2014-09-15 NOTE — Progress Notes (Signed)
Paged Dr.  Algis Liming, pt in second degree heart block type 2 HR 40 per [facility] tele.

## 2014-09-15 NOTE — Progress Notes (Signed)
Dr. Algis Liming returned page, T/O give pt po amlodipine and will put in order for PRN hydralazine.  Put pt on tele.

## 2014-09-15 NOTE — Progress Notes (Signed)
Utilization review completed. Elvy Mclarty, RN, BSN. 

## 2014-09-15 NOTE — Progress Notes (Signed)
PROGRESS NOTE    [name] MVE:[id number] DOB: 03/25/1941 DOA: 09/14/2014 PCP: [name], MD  HPI/Brief narrative 73 year old female patient with history of ESRD on TTS dialysis, HTN, CAD, lymphoma, A. Fib on amiodarone but not anticoagulation candidate, anxiety, recent admission for hypertensive emergency, readmitted with complaints of nausea, vomiting and abdominal pain. She also has generalized weakness. Last BM/flatus 48 hours ago. She tried stool softeners without any significant benefit. She makes small amount of urine,and denies dysuria. In the ED, imaging was suggestive of SBO versus ileus.   Assessment/Plan:  1. Abdominal pain secondary to SBO Vs Ileus vs Fecal impaction: Surgery consultation and follow-up appreciated. Per Surgery, large stool ball in the rectum. Trial of suppository to help stimulate a BM. Follow closely. 2. ESRD/mild hyperkalemia: Nephrology consulted and patient underwent dialysis today. 3. Essential hypertension: Recently admitted for hypertensive emergency. Blood pressures have improved postdialysis. There were high this morning secondary to pain, anxiety and volume overload. Metoprolol discontinued secondary to bradycardia. Continue when necessary hydralazine. 4. A. Fib: Currently in sinus bradycardia in the 40s. Held amiodarone for now. Discontinue metoprolol secondary to bradycardia. Continue monitoring on telemetry. Not candidate for anticoagulation. 5. Bradycardia/heart block: Telemetry this morning showed transient second-degree heart block. Cardiology consultation appreciated and indicated that this looks like Mobitz type I heart block. Discontinued metoprolol. We'll hold amiodarone until heart rate settled at her and can resume at lower dose. 6. Anxiety: Continue when necessary Xanax. 7. Thrombocytopenia: Follow CBCs 8. Failure to thrive: Patient expressed frustration this morning due to her multiple medical issues, poor overall ongoing  health and stated that she wanted to "go home" indicating willingness to die but denied suicidal ideations or plans. After discussing with patient and daughter, requested palliative care consultation for goals of care. Reassured patient.  Code Status: DO NOT RESUSCITATE - confirmed with patient in presence of daughter. Family Communication: discussed with daughter at bedside. Disposition Plan: home when medically stable.   Consultants:  General surgery  Nephrology  Cardiology  Palliative care team  Procedures:  hemodialysis  Antibiotics:  none   Subjective: Patient continued to complain of intermittent lower abdominal pain. No nausea or vomiting since admission last night.  Objective: Filed Vitals:   09/15/14 1430 09/15/14 1500 09/15/14 1530 09/15/14 1604  BP: 145/78 148/82 140/77 146/75  Pulse: 78 85 77 91  Temp:    96.7 F (35.9 C)  TempSrc:    Oral  Resp: 20 21 17 16   Height:      Weight:    53.4 kg (117 lb 11.6 oz)  SpO2:        Intake/Output Summary (Last 24 hours) at 09/15/14 1903 Last data filed at 09/15/14 1604  Gross per 24 hour  Intake      0 ml  Output   2094 ml  Net  -2094 ml   Filed Weights   09/15/14 0224 09/15/14 1200 09/15/14 1604  Weight: 54.749 kg (120 lb 11.2 oz) 55.5 kg (122 lb 5.7 oz) 53.4 kg (117 lb 11.6 oz)     Exam:  General exam: pleasant elderly frail female, chronically ill-looking, seen this morning prior to dialysis, appeared anxious and in mild intermittent pain. Respiratory system: Clear. No increased work of breathing. Cardiovascular system: S1 & S2 heard, RRR. No JVD, murmurs, gallops, clicks or pedal edema. Telemetry with sinus bradycardia in the 40s this morning and had transient Mobitz type I heart block. Gastrointestinal system: Abdomen is nondistended, soft and mild suprapubic tenderness without rigidity,  guarding or rebound. Normal bowel sounds heard. Central nervous system: Alert and oriented. No focal neurological  deficits. Extremities: Symmetric 5 x 5 power.   Data Reviewed: Basic Metabolic Panel:  Recent Labs Lab 09/09/14 0332 09/14/14 1858 09/15/14 0615  NA 141 137 137  K 3.6* 5.1 5.8*  CL 99 90* 90*  CO2 26 24 24   GLUCOSE 134* 164* 151*  BUN 27* 60* 67*  CREATININE 5.55* 7.41* 7.78*  CALCIUM 8.6 9.4 9.3   Liver Function Tests:  Recent Labs Lab 09/14/14 1858 09/15/14 0615  AST 14 13  ALT 7 6  ALKPHOS 69 65  BILITOT 0.4 0.4  PROT 6.3 6.0  ALBUMIN 3.4* 3.3*    Recent Labs Lab 09/14/14 1858  LIPASE 33   No results for input(s): AMMONIA in the last 168 hours. CBC:  Recent Labs Lab 09/14/14 1858 09/15/14 0615  WBC 4.7 7.2  NEUTROABS 2.6  --   HGB 14.1 13.4  HCT 43.2 40.6  MCV 100.0 98.5  PLT 118* 107*   Cardiac Enzymes:  Recent Labs Lab 09/14/14 1858  TROPONINI <0.30   BNP (last 3 results)  Recent Labs  11/03/13 1655 09/07/14 0400  PROBNP 6465.0* >70000.0*   CBG:  Recent Labs Lab 09/15/14 0817  GLUCAP 165*    Recent Results (from the past 240 hour(s))  MRSA PCR Screening     Status: None   Collection Time: 09/07/14  7:03 AM  Result Value Ref Range Status   MRSA by PCR NEGATIVE NEGATIVE Final    Comment:        The GeneXpert MRSA Assay (FDA approved for NASAL specimens only), is one component of a comprehensive MRSA colonization surveillance program. It is not intended to diagnose MRSA infection nor to guide or monitor treatment for MRSA infections.            Studies: Ct Abdomen Pelvis W Contrast  09/15/2014   CLINICAL DATA:  Bilateral lower quadrant abdominal pain  EXAM: CT ABDOMEN AND PELVIS WITH CONTRAST  TECHNIQUE: Multidetector CT imaging of the abdomen and pelvis was performed using the standard protocol following bolus administration of intravenous contrast.  CONTRAST:  177mL OMNIPAQUE IOHEXOL 300 MG/ML  SOLN  COMPARISON:  07/17/2014  FINDINGS: Lung bases demonstrate bibasilar atelectatic changes. Very minimal pleural  fluid is noted on the right. The gallbladder is well distended and demonstrates enhancing gallbladder polyps. These are better visualized than on the prior exam. Fluid attenuation lesion is again noted within the lateral spleen stable from the prior exam. The right adrenal gland is within normal limits. The left adrenal gland again demonstrates a low-density mass with peripheral calcifications stable from the previous exam. The pancreas is unremarkable.  The kidneys again show changes of renal cystic change. No renal calculi or obstructive changes are noted. Delayed images demonstrate normal excretion of contrast. There are dilated loops of distal small bowel with fecalization of small bowel contents. No definitive transition zone is identified. Correlation with physical exam is recommended as this may represent ileus. No free air is seen. No free fluid is noted. Diffuse aortoiliac calcifications are noted. Scanning into the pelvis reveals the bladder to be well distended. No pelvic mass lesion is noted. Uterus has been surgically removed. The appendix has also been removed. No acute bony abnormality is noted.  IMPRESSION: Multiple dilated loops of mid to distal small bowel without definitive transition zone. The terminal ileum is within normal limits. These changes may be related to adhesions or possibly a  small bowel ileus. Correlation with the physical exam is recommended.  Pole ladder polyp stable from the prior exam.  Stable low-density lesions in the spleen and left adrenal gland. No other new focal abnormality is noted.   Electronically Signed   By: Inez Catalina M.D.   On: 09/15/2014 00:01   Dg Abd 2 Views  09/15/2014   CLINICAL DATA:  Followup for small bowel obstruction.  EXAM: ABDOMEN - 2 VIEW  COMPARISON:  CT, 09/14/2014  FINDINGS: There is persistent small bowel dilation with air-fluid levels consistent with a moderate grade partial small bowel obstruction.  No free air.  Surgical vascular clips are  noted in the right lower quadrant. There are vascular calcifications along the aorta and iliac vessels.  IMPRESSION: 1. Persistent partial small bowel obstruction without significant change from the previous day's CT scan. No free air.   Electronically Signed   By: Lajean Manes M.D.   On: 09/15/2014 10:04   Dg Abd Acute W/chest  09/14/2014   CLINICAL DATA:  Initial evaluation for vomiting and diarrhea for 1 week. History of lymphoma.  EXAM: ACUTE ABDOMEN SERIES (ABDOMEN 2 VIEW & CHEST 1 VIEW)  COMPARISON:  Prior radiograph from 09/07/2014.  FINDINGS: Transverse heart size at the upper limits of normal, stable from prior. Mediastinal silhouette within normal limits. Atherosclerotic plaque present within the aortic arch. Right-sided Port-A-Cath stable disc position with tip overlying the cavoatrial junction.  Lungs are normally inflated. No focal infiltrate, pulmonary edema or pleural effusion. No pneumothorax.  Bowel gas pattern within normal limits without evidence of obstruction or ileus. No abnormal bowel wall thickening. No free intraperitoneal air. No soft tissue mass or abnormal calcifications. Surgical clips overlie the right lower quadrant.  Multilevel degenerative changes present throughout the visualized spine, most prevalent at the lumbosacral junction. No acute osseous abnormality.  IMPRESSION: 1. Nonobstructive bowel gas pattern with no radiographic evidence of acute intra-abdominal abnormality. 2. No active cardiopulmonary disease.   Electronically Signed   By: Jeannine Boga M.D.   On: 09/14/2014 20:47        Scheduled Meds: . amiodarone  200 mg Oral BH-q7a  . amLODipine  5 mg Oral Daily  . [START ON 09/16/2014] calcitRIOL  0.5 mcg Oral Q T,Th,[name]  . fentaNYL  25 mcg Intravenous Q3H  . [START ON 09/16/2014] ferric gluconate (FERRLECIT/NULECIT) IV  62.5 mg Intravenous Weekly  . fluticasone  2 spray Each Nare BH-q7a  . heparin  5,000 Units Subcutaneous 3 times per day  .  pantoprazole  40 mg Oral Daily  . rosuvastatin  10 mg Oral QHS  . [START ON 09/16/2014] valACYclovir  500 mg Oral Q T,Th,Sat-1800   Continuous Infusions:   Principal Problem:   SBO (small bowel obstruction) Active Problems:   Large cell (diffuse) non-Hodgkin's lymphoma   Anemia of renal disease   ESRD on hemodialysis   Paroxysmal atrial fibrillation   CAD (coronary artery disease)    Time spent: 49 minutes    Pieter Fooks, MD, FACP, FHM. [facility] Pager [phone]  If 7PM-7AM, please contact night-coverage [email or web address] Password [email or web address] 09/15/2014, 7:03 PM    LOS: 1 day

## 2014-09-15 NOTE — Progress Notes (Signed)
Paged Dr. Algis Liming pt BP 206/73 nothing for BP, No IV BP meds pt NPO.  Pt dialysis pt no dialysis orders.

## 2014-09-16 ENCOUNTER — Inpatient Hospital Stay (HOSPITAL_COMMUNITY): Payer: Medicare Other

## 2014-09-16 DIAGNOSIS — R109 Unspecified abdominal pain: Secondary | ICD-10-CM | POA: Insufficient documentation

## 2014-09-16 DIAGNOSIS — K567 Ileus, unspecified: Secondary | ICD-10-CM | POA: Insufficient documentation

## 2014-09-16 DIAGNOSIS — K5641 Fecal impaction: Secondary | ICD-10-CM

## 2014-09-16 DIAGNOSIS — Z515 Encounter for palliative care: Secondary | ICD-10-CM

## 2014-09-16 DIAGNOSIS — R531 Weakness: Secondary | ICD-10-CM

## 2014-09-16 DIAGNOSIS — I480 Paroxysmal atrial fibrillation: Secondary | ICD-10-CM

## 2014-09-16 DIAGNOSIS — R1084 Generalized abdominal pain: Secondary | ICD-10-CM

## 2014-09-16 LAB — CBC
HEMATOCRIT: 38.9 % (ref 36.0–46.0)
Hemoglobin: 12.5 g/dL (ref 12.0–15.0)
MCH: 31.6 pg (ref 26.0–34.0)
MCHC: 32.1 g/dL (ref 30.0–36.0)
MCV: 98.2 fL (ref 78.0–100.0)
Platelets: 64 10*3/uL — ABNORMAL LOW (ref 150–400)
RBC: 3.96 MIL/uL (ref 3.87–5.11)
RDW: 18.4 % — AB (ref 11.5–15.5)
WBC: 6.8 10*3/uL (ref 4.0–10.5)

## 2014-09-16 LAB — BASIC METABOLIC PANEL
ANION GAP: 23 — AB (ref 5–15)
BUN: 35 mg/dL — AB (ref 6–23)
CALCIUM: 8.8 mg/dL (ref 8.4–10.5)
CO2: 20 mEq/L (ref 19–32)
Chloride: 94 mEq/L — ABNORMAL LOW (ref 96–112)
Creatinine, Ser: 5.08 mg/dL — ABNORMAL HIGH (ref 0.50–1.10)
GFR calc non Af Amer: 8 mL/min — ABNORMAL LOW (ref >90)
GFR, EST AFRICAN AMERICAN: 9 mL/min — AB (ref >90)
Glucose, Bld: 79 mg/dL (ref 70–99)
Potassium: 4.9 mEq/L (ref 3.7–5.3)
Sodium: 137 mEq/L (ref 137–147)

## 2014-09-16 LAB — PHOSPHORUS: Phosphorus: 4.6 mg/dL (ref 2.3–4.6)

## 2014-09-16 MED ORDER — HEPARIN SODIUM (PORCINE) 1000 UNIT/ML DIALYSIS
3500.0000 [IU] | Freq: Once | INTRAMUSCULAR | Status: DC
Start: 2014-09-16 — End: 2014-09-16

## 2014-09-16 MED ORDER — LANTHANUM CARBONATE 500 MG PO CHEW
500.0000 mg | CHEWABLE_TABLET | Freq: Three times a day (TID) | ORAL | Status: DC
  Administered 2014-09-19 – 2014-09-21 (×6): 500 mg via ORAL
  Filled 2014-09-16 (×16): qty 1

## 2014-09-16 MED ORDER — NEPRO/CARBSTEADY PO LIQD
237.0000 mL | Freq: Two times a day (BID) | ORAL | Status: DC
  Administered 2014-09-19 – 2014-09-21 (×3): 237 mL via ORAL
  Filled 2014-09-16 (×2): qty 237

## 2014-09-16 MED ORDER — HEPARIN SODIUM (PORCINE) 1000 UNIT/ML DIALYSIS: 1000.0000 [IU] | INTRAMUSCULAR | Status: DC | PRN

## 2014-09-16 MED ORDER — LIDOCAINE-PRILOCAINE 2.5-2.5 % EX CREA: 1.0000 "application " | TOPICAL_CREAM | CUTANEOUS | Status: DC | PRN

## 2014-09-16 MED ORDER — PENTAFLUOROPROP-TETRAFLUOROETH EX AERO: 1.0000 "application " | INHALATION_SPRAY | CUTANEOUS | Status: DC | PRN

## 2014-09-16 MED ORDER — LIDOCAINE HCL (PF) 1 % IJ SOLN: 5.0000 mL | INTRAMUSCULAR | Status: DC | PRN

## 2014-09-16 MED ORDER — SALINE SPRAY 0.65 % NA SOLN
1.0000 | NASAL | Status: DC | PRN
  Administered 2014-09-16 – 2014-09-17 (×2): 1 via NASAL
  Filled 2014-09-16: qty 44

## 2014-09-16 MED ORDER — FLEET ENEMA 7-19 GM/118ML RE ENEM
1.0000 | ENEMA | Freq: Once | RECTAL | Status: AC
  Administered 2014-09-16: 1 via RECTAL
  Filled 2014-09-16: qty 1

## 2014-09-16 MED ORDER — DOCUSATE SODIUM 100 MG PO CAPS
100.0000 mg | ORAL_CAPSULE | Freq: Two times a day (BID) | ORAL | Status: DC
  Administered 2014-09-16 – 2014-09-21 (×9): 100 mg via ORAL
  Filled 2014-09-16 (×13): qty 1

## 2014-09-16 MED ORDER — NEPRO/CARBSTEADY PO LIQD
237.0000 mL | ORAL | Status: DC | PRN
  Filled 2014-09-16: qty 237

## 2014-09-16 MED ORDER — ALUM & MAG HYDROXIDE-SIMETH 200-200-20 MG/5ML PO SUSP
30.0000 mL | ORAL | Status: DC | PRN
  Administered 2014-09-16: 30 mL via ORAL
  Filled 2014-09-16: qty 30

## 2014-09-16 MED ORDER — SODIUM CHLORIDE 0.9 % IV SOLN: 100.0000 mL | INTRAVENOUS | Status: DC | PRN

## 2014-09-16 MED ORDER — ALTEPLASE 2 MG IJ SOLR
2.0000 mg | Freq: Once | INTRAMUSCULAR | Status: DC | PRN
  Filled 2014-09-16: qty 2

## 2014-09-16 NOTE — Progress Notes (Signed)
Subjective:  Her abdominal pain is better today.  She had fairly severe abdominal pain last night but it appears to be improving now.  She has had a worsening tremor over the past several weeks that has affected her walking involving both her feet and hands associated with generalized weakness.  Objective:  Vital Signs in the last 24 hours: BP 162/58 mmHg  Pulse 85  Temp(Src) 98.3 F (36.8 C) (Oral)  Resp 17  Ht 5\' 2"  (1.575 m)  Wt 52.6 kg (115 lb 15.4 oz)  BMI 21.20 kg/m2  SpO2 92%  Physical Exam: Pleasant white female currently in no acute distress Lungs:  Clear  Cardiac:  Regular rhythm, normal S1 and S2, no S3 Abdomen:  Soft, nontender, no masses Extremities:  No edema present Neurologic: Marked resting tremor noted of hands and feet  Intake/Output from previous day: 11/18 0701 - 11/19 0700 In: 150 [P.O.:30] Out: 2094  Weight Filed Weights   09/15/14 1200 09/15/14 1604 09/15/14 2048  Weight: 55.5 kg (122 lb 5.7 oz) 53.4 kg (117 lb 11.6 oz) 52.6 kg (115 lb 15.4 oz)    Lab Results: Basic Metabolic Panel:  Recent Labs  09/15/14 0615 09/16/14 0438  NA 137 137  K 5.8* 4.9  CL 90* 94*  CO2 24 20  GLUCOSE 151* 79  BUN 67* 35*  CREATININE 7.78* 5.08*    CBC:  Recent Labs  09/14/14 1858 09/15/14 0615 09/16/14 0438  WBC 4.7 7.2 6.8  NEUTROABS 2.6  --   --   HGB 14.1 13.4 12.5  HCT 43.2 40.6 38.9  MCV 100.0 98.5 98.2  PLT 118* 107* 64*    BNP    Component Value Date/Time   PROBNP >70000.0* 09/07/2014 0400    PROTIME: Lab Results  Component Value Date   INR 1.37 09/15/2014   INR 1.08 08/25/2014   INR 0.89 12/19/2013    Telemetry: She appears to be in sinus rhythm since yesterday.  Has occasional pauses that appeared to be blocked PACs.  Wenckebach has resolved.  Assessment/Plan:  1.  Second-degree AV block-type I improved with reduction in medications. 2.  History of atrial fibrillation currently in sinus rhythm. 3.  Chronic diastolic heart  failure. 4.  Marked tremor that may be a side effect of amiodarone.  Recommendations:  Her tremors fairly marked and is disabling.  Sometimes this response to reducing the dose of amiodarone, but I think in light of the tremor.  I would hold amiodarone for now and consider restarting beta blocker at a lower dose.  Amiodarone was started.  Because of recurrent atrial fibrillation in the past.  If the tremor gets better with stopping the amiodarone, we could consider restarting at a lower dose.     Kerry Hough  MD [facility]  09/16/2014, 9:02 AM

## 2014-09-16 NOTE — Progress Notes (Signed)
Patient ID: [name], female   DOB: [birth year], 73 y.o.   MRN: [id number]    Subjective: Pt c/o rectal pain today, but otherwise no nausea  Objective: Vital signs in last 24 hours: Temp:  [96.7 F (35.9 C)-99.3 F (37.4 C)] 98.3 F (36.8 C) (11/19 0438) Pulse Rate:  [77-91] 85 (11/19 0438) Resp:  [15-21] 17 (11/19 0438) BP: (139-169)/(58-83) 162/58 mmHg (11/19 0438) SpO2:  [92 %-100 %] 92 % (11/19 0438) Weight:  [115 lb 15.4 oz (52.6 kg)-122 lb 5.7 oz (55.5 kg)] 115 lb 15.4 oz (52.6 kg) (11/18 2048) Last BM Date: 09/16/14  Intake/Output from previous day: 11/18 0701 - 11/19 0700 In: 150 [P.O.:30] Out: 2094  Intake/Output this shift:    PE: Abd: soft, still mildly tender, +BS, ND  Lab Results:   Recent Labs  09/15/14 0615 09/16/14 0438  WBC 7.2 6.8  HGB 13.4 12.5  HCT 40.6 38.9  PLT 107* 64*   BMET  Recent Labs  09/15/14 0615 09/16/14 0438  NA 137 137  K 5.8* 4.9  CL 90* 94*  CO2 24 20  GLUCOSE 151* 79  BUN 67* 35*  CREATININE 7.78* 5.08*  CALCIUM 9.3 8.8   PT/INR  Recent Labs  09/15/14 0615  LABPROT 17.0*  INR 1.37   CMP     Component Value Date/Time   NA 137 09/16/2014 0438   NA 142 09/03/2014 1217   K 4.9 09/16/2014 0438   K 3.3 09/03/2014 1217   CL 94* 09/16/2014 0438   CL 95* 09/03/2014 1217   CO2 20 09/16/2014 0438   CO2 32 09/03/2014 1217   GLUCOSE 79 09/16/2014 0438   GLUCOSE 129* 09/03/2014 1217   BUN 35* 09/16/2014 0438   BUN 13 09/03/2014 1217   CREATININE 5.08* 09/16/2014 0438   CREATININE 3.6* 09/03/2014 1217   CALCIUM 8.8 09/16/2014 0438   CALCIUM 8.9 09/03/2014 1217   PROT 6.0 09/15/2014 0615   PROT 6.3* 09/03/2014 1217   ALBUMIN 3.3* 09/15/2014 0615   AST 13 09/15/2014 0615   AST 18 09/03/2014 1217   ALT 6 09/15/2014 0615   ALT 13 09/03/2014 1217   ALKPHOS 65 09/15/2014 0615   ALKPHOS 54 09/03/2014 1217   BILITOT 0.4 09/15/2014 0615   BILITOT 0.50 09/03/2014 1217   GFRNONAA 8* 09/16/2014 0438   GFRAA 9*  09/16/2014 0438   Lipase     Component Value Date/Time   LIPASE 33 09/14/2014 1858       Studies/Results: Ct Abdomen Pelvis W Contrast  09/15/2014   CLINICAL DATA:  Bilateral lower quadrant abdominal pain  EXAM: CT ABDOMEN AND PELVIS WITH CONTRAST  TECHNIQUE: Multidetector CT imaging of the abdomen and pelvis was performed using the standard protocol following bolus administration of intravenous contrast.  CONTRAST:  167mL OMNIPAQUE IOHEXOL 300 MG/ML  SOLN  COMPARISON:  07/17/2014  FINDINGS: Lung bases demonstrate bibasilar atelectatic changes. Very minimal pleural fluid is noted on the right. The gallbladder is well distended and demonstrates enhancing gallbladder polyps. These are better visualized than on the prior exam. Fluid attenuation lesion is again noted within the lateral spleen stable from the prior exam. The right adrenal gland is within normal limits. The left adrenal gland again demonstrates a low-density mass with peripheral calcifications stable from the previous exam. The pancreas is unremarkable.  The kidneys again show changes of renal cystic change. No renal calculi or obstructive changes are noted. Delayed images demonstrate normal excretion of contrast. There are dilated loops of  distal small bowel with fecalization of small bowel contents. No definitive transition zone is identified. Correlation with physical exam is recommended as this may represent ileus. No free air is seen. No free fluid is noted. Diffuse aortoiliac calcifications are noted. Scanning into the pelvis reveals the bladder to be well distended. No pelvic mass lesion is noted. Uterus has been surgically removed. The appendix has also been removed. No acute bony abnormality is noted.  IMPRESSION: Multiple dilated loops of mid to distal small bowel without definitive transition zone. The terminal ileum is within normal limits. These changes may be related to adhesions or possibly a small bowel ileus. Correlation  with the physical exam is recommended.  Pole ladder polyp stable from the prior exam.  Stable low-density lesions in the spleen and left adrenal gland. No other new focal abnormality is noted.   Electronically Signed   By: Inez Catalina M.D.   On: 09/15/2014 00:01   Dg Abd 2 Views  09/16/2014   CLINICAL DATA:  Partial small bowel obstruction/ileus.  EXAM: ABDOMEN - 2 VIEW  COMPARISON:  09/15/2014  FINDINGS: Further radiographic improvement present with essentially complete resolution of partial small bowel obstruction/ileus. There is further contrast transit into the colon. No dilated small bowel loops remain. No evidence of free intraperitoneal air or pneumatosis.  IMPRESSION: Resolution of partial small bowel obstruction/ileus.   Electronically Signed   By: Aletta Edouard M.D.   On: 09/16/2014 07:57   Dg Abd 2 Views  09/15/2014   CLINICAL DATA:  Followup for small bowel obstruction.  EXAM: ABDOMEN - 2 VIEW  COMPARISON:  CT, 09/14/2014  FINDINGS: There is persistent small bowel dilation with air-fluid levels consistent with a moderate grade partial small bowel obstruction.  No free air.  Surgical vascular clips are noted in the right lower quadrant. There are vascular calcifications along the aorta and iliac vessels.  IMPRESSION: 1. Persistent partial small bowel obstruction without significant change from the previous day's CT scan. No free air.   Electronically Signed   By: Lajean Manes M.D.   On: 09/15/2014 10:04   Dg Abd Acute W/chest  09/14/2014   CLINICAL DATA:  Initial evaluation for vomiting and diarrhea for 1 week. History of lymphoma.  EXAM: ACUTE ABDOMEN SERIES (ABDOMEN 2 VIEW & CHEST 1 VIEW)  COMPARISON:  Prior radiograph from 09/07/2014.  FINDINGS: Transverse heart size at the upper limits of normal, stable from prior. Mediastinal silhouette within normal limits. Atherosclerotic plaque present within the aortic arch. Right-sided Port-A-Cath stable disc position with tip overlying the  cavoatrial junction.  Lungs are normally inflated. No focal infiltrate, pulmonary edema or pleural effusion. No pneumothorax.  Bowel gas pattern within normal limits without evidence of obstruction or ileus. No abnormal bowel wall thickening. No free intraperitoneal air. No soft tissue mass or abnormal calcifications. Surgical clips overlie the right lower quadrant.  Multilevel degenerative changes present throughout the visualized spine, most prevalent at the lumbosacral junction. No acute osseous abnormality.  IMPRESSION: 1. Nonobstructive bowel gas pattern with no radiographic evidence of acute intra-abdominal abnormality. 2. No active cardiopulmonary disease.   Electronically Signed   By: Jeannine Boga M.D.   On: 09/14/2014 20:47    Anti-infectives: Anti-infectives    Start     Dose/Rate Route Frequency Ordered Stop   09/16/14 1800  valACYclovir (VALTREX) tablet 500 mg     500 mg Oral Every T-Th-Sa (1800) 09/15/14 0245         Assessment/Plan  1. Ileus 2.  Fecal impaction  Plan: 1. Abdominal films improved and exam relatively benign.  Will give her clear liquids today and also try a fleet enema to see if this will help with her fecal impaction.  If not, she may need some manual disimpaction.   LOS: 2 days    Vincent Ehrler E 09/16/2014, 10:13 AM Pager: [phone]

## 2014-09-16 NOTE — Progress Notes (Signed)
Subjective: :       I'm better", tolerated HD yesterday  Off schedule for ^ K,  Wants to continue HD, No abd pain now.Had BM this am.   Objective Vital signs in last 24 hours: Filed Vitals:   09/15/14 1530 09/15/14 1604 09/15/14 2048 09/16/14 0438  BP: 140/77 146/75 148/69 162/58  Pulse: 77 91 87 85  Temp:  96.7 F (35.9 C) 99.3 F (37.4 C) 98.3 F (36.8 C)  TempSrc:  Oral Oral Oral  Resp: 17 16 16 17   Height:      Weight:  53.4 kg (117 lb 11.6 oz) 52.6 kg (115 lb 15.4 oz)   SpO2:   97% 92%   Weight change: -0.293 kg (-10.3 oz)  Physical Exam: General: alert, NAD, mild feet tremor, not sure what today is /oriented to [employer] and place about her baseline Heart: RRR this am, no mur, gallop or rub Lungs: CTA bilat Abdomen: BS =+, soft NT, ND Extremities:No pedal edema  Dialysis Access: pos bruit L UA  avf    Dialysis Orders: TTS @ [facility] 54kgs 2K/2Ca 4 hr 3500 heparin L AVF 400/800 Calcitriol 0.50 mcg Aranesp 200 q week Venofer 50 q week  Problem/Plan: 1. Nausea / abd pain / fecal Impaction= CCS following, BM this am to start diet per ccs 2. A. Fib with sec Degree hrt blk / Now in SR = Card / Dr. Valeta Harms  Holding Amiodarone sec to Tremor/ beta blker low dose if needed/ Previously Metoprolol dc sec to bradycardia 3. Hyperkalemia= am K 4.9 ,yesterday 5.8k / hd today  4. ESRD - HD TTS/ do 3 hr today to keep on schedule 5. Anemia - 12,5 <13.1hgb no ESA  In hosp for now 6. Secondary hyperparathyroidism - po vit d/  Corrected CA 9.3 /phos pending/ Dc Renvela sec GI intolerance/ start CRUSHED  Fosrenol if phos ^ with meals 7. HTN/volume - BP= 162/58 on Amlodipine 5 mg q day -make hs as at home start tomorrow and ^ 10 mg with vol okay and bp ^.Prior Metoprolol dc sec Bradycardia 8. Anxiety- Xanax - appears baseline this am , appropriate,  And does want to continue HD 9. Non- Hodgkin"s Lymphoma/ weakness- palliative care to see  Ernest Haber, PA-C [facility] Beeper [phone] 09/16/2014,10:08 AM  LOS: 2 days   Pt seen, examined and agree w A/P as above.  Kelly Splinter MD pager [phone]    cell [phone] 09/16/2014, 1:37 PM    Labs: Basic Metabolic Panel:  Recent Labs Lab 09/14/14 1858 09/15/14 0615 09/16/14 0438  NA 137 137 137  K 5.1 5.8* 4.9  CL 90* 90* 94*  CO2 24 24 20   GLUCOSE 164* 151* 79  BUN 60* 67* 35*  CREATININE 7.41* 7.78* 5.08*  CALCIUM 9.4 9.3 8.8   Liver Function Tests:  Recent Labs Lab 09/14/14 1858 09/15/14 0615  AST 14 13  ALT 7 6  ALKPHOS 69 65  BILITOT 0.4 0.4  PROT 6.3 6.0  ALBUMIN 3.4* 3.3*    Recent Labs Lab 09/14/14 1858  LIPASE 33  CBC:  Recent Labs Lab 09/14/14 1858 09/15/14 0615 09/16/14 0438  WBC 4.7 7.2 6.8  NEUTROABS 2.6  --   --   HGB 14.1 13.4 12.5  HCT 43.2 40.6 38.9  MCV 100.0 98.5 98.2  PLT 118* 107* 64*   Cardiac Enzymes:  Recent Labs Lab 09/14/14 1858  TROPONINI <0.30   CBG:  Recent Labs Lab 09/15/14 0817  GLUCAP 165*   Medications:   . amLODipine  5 mg Oral Daily  . calcitRIOL  0.5 mcg Oral Q T,Th,[name]  . ferric gluconate (FERRLECIT/NULECIT) IV  62.5 mg Intravenous Weekly  . fluticasone  2 spray Each Nare BH-q7a  . heparin  5,000 Units Subcutaneous 3 times per day  . pantoprazole  40 mg Oral Daily  . rosuvastatin  10 mg Oral QHS  . sodium phosphate  1 enema Rectal Once  . valACYclovir  500 mg Oral Q T,Th,Sat-1800

## 2014-09-16 NOTE — Progress Notes (Addendum)
PROGRESS NOTE    [name] TDV:[id number] DOB: 22-Apr-1941 DOA: 09/14/2014 PCP: [name], MD  HPI/Brief narrative 73 year old female patient with history of ESRD on TTS dialysis, HTN, CAD, lymphoma, A. Fib on amiodarone but not anticoagulation candidate, anxiety, recent admission for hypertensive emergency, readmitted with complaints of nausea, vomiting and abdominal pain. She also has generalized weakness. Last BM/flatus 48 hours ago. She tried stool softeners without any significant benefit. She makes small amount of urine,and denies dysuria. In the ED, imaging was suggestive of SBO versus ileus.   Assessment/Plan:  1. Abdominal pain secondary to SBO Vs Ileus & Fecal impaction: Surgery consultation and follow-up appreciated. Per Surgery, large stool ball in the rectum. Trial of suppository to help stimulate a BM - only had small BM- trial of enema today. If no adequate response, may need manual disimpaction. Improving. SBO/Ileus has resolved 2. ESRD/mild hyperkalemia: Nephrology consulted and patient underwent dialysis 11/18. Management per nephrology. Hyperkalemia resolved. 3. Essential hypertension: Recently admitted for hypertensive emergency. Blood pressures have improved postdialysis. Metoprolol discontinued secondary to bradycardia. Continue when necessary hydralazine. 4. A. Fib/heart block and bradycardia: Patient was in Mobitz type I second-degree AV block and sinus bradycardia in the 40s on 11/18 a.m. Amiodarone and metoprolol were held. Heart rate improved to sinus rhythm in the 80s. Cardiology follow-up appreciated-recommend keeping off amiodarone secondary to bradycardia and tremors and recommend starting low-dose metoprolol. 5. Anxiety: Continue when necessary Xanax. 6. Thrombocytopenia: Platelet counts lower at 64. Follow CBCs. Patient has had issues with intermittent low platelets and was 61 on 6/22. 7. Failure to thrive: Palliative care team input appreciated.  Patient wishes to continue medical care and DO NOT RESUSCITATE 8. History of non-Hodgkin's lymphoma: As per family, follows with oncology and is said to be in remission.  Code Status: DO NOT RESUSCITATE  Family Communication: discussed with daughter and son at bedside. Disposition Plan: ? SNF when medically stable   Consultants:  General surgery  Nephrology  Cardiology  Palliative care team  Procedures:  hemodialysis  Antibiotics:  none   Subjective: Felt better this morning. Had a small BM following suppository last night. Still having mild lower abdominal pain. No further nausea or vomiting.  Objective: Filed Vitals:   09/16/14 1241 09/16/14 1300 09/16/14 1330 09/16/14 1400  BP: 131/68 159/73 130/67 130/67  Pulse: 84 80 93 94  Temp:      TempSrc:      Resp:      Height:      Weight:      SpO2:       temperature 98.87F, RR 18/m and pulse oximetry 95%  Intake/Output Summary (Last 24 hours) at 09/16/14 1443 Last data filed at 09/16/14 0700  Gross per 24 hour  Intake    150 ml  Output   2094 ml  Net  -1944 ml   Filed Weights   09/15/14 1604 09/15/14 2048 09/16/14 1236  Weight: 53.4 kg (117 lb 11.6 oz) 52.6 kg (115 lb 15.4 oz) 53.2 kg (117 lb 4.6 oz)     Exam:  General exam: pleasant elderly frail female, chronically ill-looking, comfortably supine in bed this morning and appears much better than she did yesterday. Respiratory system: Clear. No increased work of breathing. Cardiovascular system: S1 & S2 heard, RRR. No JVD, murmurs, gallops, clicks or pedal edema. Telemetry sinus rhythm in the 80s since noontime on 11/18 Gastrointestinal system: Abdomen is nondistended, soft and mild suprapubic tenderness without rigidity, guarding or rebound. Normal bowel sounds heard.  Central nervous system: Alert and oriented. No focal neurological deficits. Extremities: Symmetric 5 x 5 power.   Data Reviewed: Basic Metabolic Panel:  Recent Labs Lab 09/14/14 1858  09/15/14 0615 09/16/14 0438 09/16/14 1248  NA 137 137 137  --   K 5.1 5.8* 4.9  --   CL 90* 90* 94*  --   CO2 24 24 20   --   GLUCOSE 164* 151* 79  --   BUN 60* 67* 35*  --   CREATININE 7.41* 7.78* 5.08*  --   CALCIUM 9.4 9.3 8.8  --   PHOS  --   --   --  4.6   Liver Function Tests:  Recent Labs Lab 09/14/14 1858 09/15/14 0615  AST 14 13  ALT 7 6  ALKPHOS 69 65  BILITOT 0.4 0.4  PROT 6.3 6.0  ALBUMIN 3.4* 3.3*    Recent Labs Lab 09/14/14 1858  LIPASE 33   No results for input(s): AMMONIA in the last 168 hours. CBC:  Recent Labs Lab 09/14/14 1858 09/15/14 0615 09/16/14 0438  WBC 4.7 7.2 6.8  NEUTROABS 2.6  --   --   HGB 14.1 13.4 12.5  HCT 43.2 40.6 38.9  MCV 100.0 98.5 98.2  PLT 118* 107* 64*   Cardiac Enzymes:  Recent Labs Lab 09/14/14 1858  TROPONINI <0.30   BNP (last 3 results)  Recent Labs  11/03/13 1655 09/07/14 0400  PROBNP 6465.0* >70000.0*   CBG:  Recent Labs Lab 09/15/14 0817  GLUCAP 165*    Recent Results (from the past 240 hour(s))  MRSA PCR Screening     Status: None   Collection Time: 09/07/14  7:03 AM  Result Value Ref Range Status   MRSA by PCR NEGATIVE NEGATIVE Final    Comment:        The GeneXpert MRSA Assay (FDA approved for NASAL specimens only), is one component of a comprehensive MRSA colonization surveillance program. It is not intended to diagnose MRSA infection nor to guide or monitor treatment for MRSA infections.            Studies: Ct Abdomen Pelvis W Contrast  09/15/2014   CLINICAL DATA:  Bilateral lower quadrant abdominal pain  EXAM: CT ABDOMEN AND PELVIS WITH CONTRAST  TECHNIQUE: Multidetector CT imaging of the abdomen and pelvis was performed using the standard protocol following bolus administration of intravenous contrast.  CONTRAST:  126mL OMNIPAQUE IOHEXOL 300 MG/ML  SOLN  COMPARISON:  07/17/2014  FINDINGS: Lung bases demonstrate bibasilar atelectatic changes. Very minimal pleural  fluid is noted on the right. The gallbladder is well distended and demonstrates enhancing gallbladder polyps. These are better visualized than on the prior exam. Fluid attenuation lesion is again noted within the lateral spleen stable from the prior exam. The right adrenal gland is within normal limits. The left adrenal gland again demonstrates a low-density mass with peripheral calcifications stable from the previous exam. The pancreas is unremarkable.  The kidneys again show changes of renal cystic change. No renal calculi or obstructive changes are noted. Delayed images demonstrate normal excretion of contrast. There are dilated loops of distal small bowel with fecalization of small bowel contents. No definitive transition zone is identified. Correlation with physical exam is recommended as this may represent ileus. No free air is seen. No free fluid is noted. Diffuse aortoiliac calcifications are noted. Scanning into the pelvis reveals the bladder to be well distended. No pelvic mass lesion is noted. Uterus has been surgically removed. The appendix has  also been removed. No acute bony abnormality is noted.  IMPRESSION: Multiple dilated loops of mid to distal small bowel without definitive transition zone. The terminal ileum is within normal limits. These changes may be related to adhesions or possibly a small bowel ileus. Correlation with the physical exam is recommended.  Pole ladder polyp stable from the prior exam.  Stable low-density lesions in the spleen and left adrenal gland. No other new focal abnormality is noted.   Electronically Signed   By: Inez Catalina M.D.   On: 09/15/2014 00:01   Dg Abd 2 Views  09/16/2014   CLINICAL DATA:  Partial small bowel obstruction/ileus.  EXAM: ABDOMEN - 2 VIEW  COMPARISON:  09/15/2014  FINDINGS: Further radiographic improvement present with essentially complete resolution of partial small bowel obstruction/ileus. There is further contrast transit into the colon. No  dilated small bowel loops remain. No evidence of free intraperitoneal air or pneumatosis.  IMPRESSION: Resolution of partial small bowel obstruction/ileus.   Electronically Signed   By: Aletta Edouard M.D.   On: 09/16/2014 07:57   Dg Abd 2 Views  09/15/2014   CLINICAL DATA:  Followup for small bowel obstruction.  EXAM: ABDOMEN - 2 VIEW  COMPARISON:  CT, 09/14/2014  FINDINGS: There is persistent small bowel dilation with air-fluid levels consistent with a moderate grade partial small bowel obstruction.  No free air.  Surgical vascular clips are noted in the right lower quadrant. There are vascular calcifications along the aorta and iliac vessels.  IMPRESSION: 1. Persistent partial small bowel obstruction without significant change from the previous day's CT scan. No free air.   Electronically Signed   By: Lajean Manes M.D.   On: 09/15/2014 10:04   Dg Abd Acute W/chest  09/14/2014   CLINICAL DATA:  Initial evaluation for vomiting and diarrhea for 1 week. History of lymphoma.  EXAM: ACUTE ABDOMEN SERIES (ABDOMEN 2 VIEW & CHEST 1 VIEW)  COMPARISON:  Prior radiograph from 09/07/2014.  FINDINGS: Transverse heart size at the upper limits of normal, stable from prior. Mediastinal silhouette within normal limits. Atherosclerotic plaque present within the aortic arch. Right-sided Port-A-Cath stable disc position with tip overlying the cavoatrial junction.  Lungs are normally inflated. No focal infiltrate, pulmonary edema or pleural effusion. No pneumothorax.  Bowel gas pattern within normal limits without evidence of obstruction or ileus. No abnormal bowel wall thickening. No free intraperitoneal air. No soft tissue mass or abnormal calcifications. Surgical clips overlie the right lower quadrant.  Multilevel degenerative changes present throughout the visualized spine, most prevalent at the lumbosacral junction. No acute osseous abnormality.  IMPRESSION: 1. Nonobstructive bowel gas pattern with no radiographic  evidence of acute intra-abdominal abnormality. 2. No active cardiopulmonary disease.   Electronically Signed   By: Jeannine Boga M.D.   On: 09/14/2014 20:47        Scheduled Meds: . amLODipine  5 mg Oral Daily  . calcitRIOL  0.5 mcg Oral Q T,Th,[name]  . docusate sodium  100 mg Oral BID  . feeding supplement (NEPRO CARB STEADY)  237 mL Oral BID BM  . ferric gluconate (FERRLECIT/NULECIT) IV  62.5 mg Intravenous Weekly  . fluticasone  2 spray Each Nare BH-q7a  . heparin  3,500 Units Dialysis Once in dialysis  . heparin  5,000 Units Subcutaneous 3 times per day  . pantoprazole  40 mg Oral Daily  . rosuvastatin  10 mg Oral QHS  . valACYclovir  500 mg Oral Q T,Th,Sat-1800   Continuous Infusions:  Principal Problem:   SBO (small bowel obstruction) Active Problems:   Large cell (diffuse) non-Hodgkin's lymphoma   Anemia of renal disease   ESRD on hemodialysis   Paroxysmal atrial fibrillation   CAD (coronary artery disease)   Palliative care encounter   Weakness generalized   Abdominal pain    Time spent: 25 minutes    Juanito Gonyer, MD, FACP, FHM. [facility] Pager [phone]  If 7PM-7AM, please contact night-coverage [email or web address] Password [email or web address] 09/16/2014, 2:43 PM    LOS: 2 days

## 2014-09-16 NOTE — Progress Notes (Signed)
[facility]  Patient Status: Active (receiving services up to time of hospitalization)  [facility] is providing the following services: RN, PT and OT  If patient discharges after hours, please call [phone].   [name] 09/16/2014, 5:04 PM

## 2014-09-16 NOTE — Consult Note (Signed)
Patient [name]      DOB: 10/27/1941      EZM:[id number]     Consult Note from the Palliative Medicine Team at [facility] Requested by: Dr. Algis Liming     PCP: [name], MD Reason for Consultation: [name]     Phone Number:[phone]  Assessment of patients Current state: I met today with [name], her daughter [name] and her husband, and her son [name] at bedside. [name] had a bad day yesterday and was ready to be comfortable and die but is feeling better and says her mind and decisions are not clouded by her pain. She is not ready to stop dialysis and shift to comfort. However, she does want to maintain her DNR status. Her children were very concerned about this decision yesterday but she was able to tell them that after witnessing what her brother went through she would not want to subject them or herself to that kind of life prolonging measures and suffering. Her children say they feel much better about this decision now. They are hopeful for short term rehab at discharge with a plan to move into ALF or SNF care - she is open to this. They are very concerned about her being at home. They say that her husband is not very supportive or understanding of her limitations. She has fallen 4 times recently at home and hit her head and she needs much more assistance and support than she is able to get at home. They are also interested in completing HCPOA and possibly Living Will so I supplied them with a copy. I will continue to follow. Palliative to follow at discharge.   [name] tells me that she enjoys her 2 dogs and watching her soap operas. She is close with her family but has a hard time at home with her husband and is open to other venues/facilities where she can have the care and assistance she requires. She has complaints of abd pain and rectal pain, anxiety, constipation, gas. She also tells me that she was scheduled for routine PET scan to follow up on her  lymphoma but will have to reschedule (she tells me that she has not had active lymphoma in years but has had to deal with this 4 different times).    Goals of Care: 1.  Code Status: DNR confirmed.    2. Scope of Treatment: Continue medical management and offered/available interventions outside DNR.    4. Disposition: Hopeful for SNF rehab with palliative to follow.    3. Symptom Management:   1. Anxiety/Agitation: Xanax 0.5 mg qhs prn.  2. Pain: Oxycodone 5 mg every 4 hours prn moderate pain. Fentanyl 25 mcg every 3 hours severe pain.  3. Bowel Regimen: Colace daily. Fleet enema today. Would also recommendSenokot-S 2 tablets daily as well.  4. Gas pains: Maalox every 4 hours prn.  5. Weakness: PT consulted. Continue HD and medical management.  6. Vertigo: She states that PT has helped with her vertigo in the past.  7. Malnutrition: Encourage frequent small meals. Nepro BID. She says she weighed ~240 lbs but has lost down to ~125 lbs over the past ~5 years. Has very little appetite.   4. Psychosocial: Emotional support provided to patient and to family at bedside.   5. Spiritual: She is very spiritual person and says that when it is her time "I will be ready to meet my maker." She does not fear dying.  Patient Documents Completed or Given: Document Given Completed  Advanced Directives Pkt    MOST    DNR    Gone from My Sight    Hard Choices yes     Brief HPI: 73 yo female admitted with nasuea/vomiting/abd pain r/t SBO vs ileus vs fecal impaction. She had BM 11/18 with f/u enema today as she found to have large ball of stool in rectum and may require disimpaction. Also treated for hypertensive emergency. She has had bradycardia with Mobitz type I. Continuing HD and has thrombocytopenia with poor malnutrition. PMH significant for large cell (diffuse) non-Hodgkin's lymphoma, anxiety, hypertension, anemia, panic attacks, atrial fibrillation, CAD, type II diabetes mellitus, ESRD HD  TTS.    ROS: + rectal pain, + abd pain, + anxiety, + weakness, + poor appetite    PMH:  Past Medical History  Diagnosis Date  . Hypertension   . Anxiety     h/o anxiety attacks  . Anemia of renal disease   . Claustrophobia   . Panic attacks   . 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 CAD (coronary artery disease)   . Atrial fibrillation   . High cholesterol     hx  . Type 2 diabetes mellitus     "don't take RX now" (11/04/2013)  . Arthritis     "left hip" (11/04/2013)  . ESRD (end stage renal disease) on dialysis 2014    "[address]; TTS" (11/04/2013)  . Large cell (diffuse) non-Hodgkin's lymphoma dx'd ?2011     PSH: Past Surgical History  Procedure Laterality Date  . Appendectomy  2009  . Cardiac catheterization  2011  . Shoulder open rotator cuff repair Right ?2013  . Esophagogastroduodenoscopy (egd) with propofol  10/06/2012    Procedure: ESOPHAGOGASTRODUODENOSCOPY (EGD) WITH PROPOFOL;  Surgeon: William Outlaw, MD;  Location: [facility];  Service: Endoscopy;  Laterality: N/A;  . Colonoscopy with propofol  10/06/2012    Procedure: COLONOSCOPY WITH PROPOFOL;  Surgeon: William Outlaw, MD;  Location: [facility];  Service: Endoscopy;  Laterality: N/A;  . Colonoscopy with propofol N/A 01/14/2013    Procedure: COLONOSCOPY WITH PROPOFOL;  Surgeon: William Outlaw, MD;  Location: [facility];  Service: Endoscopy;  Laterality: N/A;  . Portacath placement Right 01/22/2013  . Av fistula placement Left 07/24/2013    Procedure: ARTERIOVENOUS (AV) FISTULA CREATION- LEFT ;  Surgeon: Christopher S Dickson, MD;  Location: [facility] OR;  Service: Vascular;  Laterality: Left;  . Abdominal hysterectomy  1980's  . Tonsillectomy  1954   I have reviewed the FH and SH and  If appropriate update it with new information. Allergies  Allergen Reactions  . Advicor [Niacin-Lovastatin Er]     Muscle cramps  . Crestor [Rosuvastatin]     Muscle cramps  . Lipitor  [Atorvastatin]     Muscle cramps  . Lovastatin     Muscle cramps  . Morphine And Related Other (See Comments)    "made me crazy"  . Niaspan [Niacin Er]     Muscle cramps  . Nsaids     Gi side effects  . Pravachol [Pravastatin Sodium]     Gi side effects  . Sulfa Antibiotics Other (See Comments)    Can't remember   . Vytorin [Ezetimibe-Simvastatin] Nausea Only  . Welchol [Colesevelam Hcl]     Muscle cramps  . Zetia [Ezetimibe]     Muscle cramps   . Zocor [Simvastatin]     Muscle cramps      Scheduled Meds: . amLODipine  5 mg Oral Daily  . calcitRIOL  0.5 mcg Oral Q T,Th,[name]  . ferric gluconate (FERRLECIT/NULECIT) IV  62.5 mg Intravenous Weekly  . fluticasone  2 spray Each Nare BH-q7a  . heparin  5,000 Units Subcutaneous 3 times per day  . pantoprazole  40 mg Oral Daily  . rosuvastatin  10 mg Oral QHS  . sodium phosphate  1 enema Rectal Once  . valACYclovir  500 mg Oral Q T,Th,Sat-1800   Continuous Infusions:  PRN Meds:.acetaminophen **OR** acetaminophen, ALPRAZolam, alum & mag hydroxide-simeth, fentaNYL, fentaNYL, hydrALAZINE, ondansetron (ZOFRAN) IV, oxyCODONE    BP 162/58 mmHg  Pulse 85  Temp(Src) 98.3 F (36.8 C) (Oral)  Resp 17  Ht 5' 2" (1.575 m)  Wt 52.6 kg (115 lb 15.4 oz)  BMI 21.20 kg/m2  SpO2 92%   PPS: 30% at best   Intake/Output Summary (Last 24 hours) at 09/16/14 1103 Last data filed at 09/16/14 0700  Gross per 24 hour  Intake    150 ml  Output   2094 ml  Net  -1944 ml   LBM: 11/18  Physical Exam:  General: NAD, pleasant, thin, frail, ill-appearing HEENT: Muscle wasting, no JVD, moist mucous membranes, dry lips Chest: CTA throughout, no labored breathing, symmetric, Rt PAC accessed CVS: RRR, S1, S2 Abdomen: Soft, tender to light palpation, ND Ext: MAE, no edema, warm to touch Neuro: Awake, alert, oriented x 3  Labs: CBC    Component Value Date/Time   WBC 6.8 09/16/2014 0438   WBC 4.2 09/03/2014 1217   RBC 3.96 09/16/2014 0438    RBC 2.85* 09/03/2014 1217   RBC 2.97* 08/04/2013 0918   HGB 12.5 09/16/2014 0438   HGB 9.6* 09/03/2014 1217   HCT 38.9 09/16/2014 0438   HCT 30.7* 09/03/2014 1217   PLT 64* 09/16/2014 0438   PLT 112* 09/03/2014 1217   MCV 98.2 09/16/2014 0438   MCV 108* 09/03/2014 1217   MCH 31.6 09/16/2014 0438   MCH 33.7 09/03/2014 1217   MCHC 32.1 09/16/2014 0438   MCHC 31.3* 09/03/2014 1217   RDW 18.4* 09/16/2014 0438   RDW 15.4 09/03/2014 1217   LYMPHSABS 1.2 09/14/2014 1858   LYMPHSABS 1.3 09/03/2014 1217   MONOABS 0.8 09/14/2014 1858   EOSABS 0.0 09/14/2014 1858   EOSABS 0.0 09/03/2014 1217   BASOSABS 0.0 09/14/2014 1858   BASOSABS 0.0 09/03/2014 1217    BMET    Component Value Date/Time   NA 137 09/16/2014 0438   NA 142 09/03/2014 1217   K 4.9 09/16/2014 0438   K 3.3 09/03/2014 1217   CL 94* 09/16/2014 0438   CL 95* 09/03/2014 1217   CO2 20 09/16/2014 0438   CO2 32 09/03/2014 1217   GLUCOSE 79 09/16/2014 0438   GLUCOSE 129* 09/03/2014 1217   BUN 35* 09/16/2014 0438   BUN 13 09/03/2014 1217   CREATININE 5.08* 09/16/2014 0438   CREATININE 3.6* 09/03/2014 1217   CALCIUM 8.8 09/16/2014 0438   CALCIUM 8.9 09/03/2014 1217   GFRNONAA 8* 09/16/2014 0438   GFRAA 9* 09/16/2014 0438    CMP     Component Value Date/Time   NA 137 09/16/2014 0438   NA 142 09/03/2014 1217   K 4.9 09/16/2014 0438   K 3.3 09/03/2014 1217   CL 94* 09/16/2014 0438   CL 95* 09/03/2014 1217   CO2 20 09/16/2014 0438   CO2 32 09/03/2014 1217   GLUCOSE 79 09/16/2014 0438   GLUCOSE 129*  09/03/2014 1217   BUN 35* 09/16/2014 0438   BUN 13 09/03/2014 1217   CREATININE 5.08* 09/16/2014 0438   CREATININE 3.6* 09/03/2014 1217   CALCIUM 8.8 09/16/2014 0438   CALCIUM 8.9 09/03/2014 1217   PROT 6.0 09/15/2014 0615   PROT 6.3* 09/03/2014 1217   ALBUMIN 3.3* 09/15/2014 0615   AST 13 09/15/2014 0615   AST 18 09/03/2014 1217   ALT 6 09/15/2014 0615   ALT 13 09/03/2014 1217   ALKPHOS 65 09/15/2014 0615    ALKPHOS 54 09/03/2014 1217   BILITOT 0.4 09/15/2014 0615   BILITOT 0.50 09/03/2014 1217   GFRNONAA 8* 09/16/2014 0438   GFRAA 9* 09/16/2014 0438     Time In Time Out Total Time Spent with Patient Total Overall Time  0915 1100 22mn 1059m    Greater than 50%  of this time was spent counseling and coordinating care related to the above assessment and plan.  AlVinie SillNP Palliative Medicine Team Pager # [phone]-F 8a-5p) Team Phone # [phone]/Weekends)

## 2014-09-16 NOTE — Plan of Care (Signed)
Problem: Consults Goal: Diabetes Guidelines if Diabetic/Glucose > 140 If diabetic or lab glucose is > 140 mg/dl - Initiate Diabetes/Hyperglycemia Guidelines & Document Interventions  Outcome: Not Applicable Date Met:  59/92/34

## 2014-09-17 ENCOUNTER — Inpatient Hospital Stay (HOSPITAL_COMMUNITY): Payer: Medicare Other

## 2014-09-17 DIAGNOSIS — R0600 Dyspnea, unspecified: Secondary | ICD-10-CM | POA: Insufficient documentation

## 2014-09-17 LAB — CBC
HCT: 39.7 % (ref 36.0–46.0)
Hemoglobin: 12.5 g/dL (ref 12.0–15.0)
MCH: 31.4 pg (ref 26.0–34.0)
MCHC: 31.5 g/dL (ref 30.0–36.0)
MCV: 99.7 fL (ref 78.0–100.0)
Platelets: 77 10*3/uL — ABNORMAL LOW (ref 150–400)
RBC: 3.98 MIL/uL (ref 3.87–5.11)
RDW: 18.3 % — ABNORMAL HIGH (ref 11.5–15.5)
WBC: 7.3 10*3/uL (ref 4.0–10.5)

## 2014-09-17 LAB — TROPONIN I
Troponin I: <0.3 ng/mL (ref <0.30)
Troponin I: <0.3 ng/mL (ref <0.30)
Troponin I: <0.3 ng/mL (ref <0.30)

## 2014-09-17 LAB — HEPATITIS B SURFACE ANTIGEN: Hepatitis B Surface Ag: NEGATIVE

## 2014-09-17 MED ORDER — METOPROLOL SUCCINATE ER 25 MG PO TB24
25.0000 mg | ORAL_TABLET | Freq: Every day | ORAL | Status: DC
  Administered 2014-09-17: 25 mg via ORAL
  Filled 2014-09-17 (×2): qty 1

## 2014-09-17 MED ORDER — LEVALBUTEROL HCL 0.63 MG/3ML IN NEBU
0.6300 mg | INHALATION_SOLUTION | Freq: Four times a day (QID) | RESPIRATORY_TRACT | Status: DC | PRN
  Administered 2014-09-17 – 2014-09-20 (×4): 0.63 mg via RESPIRATORY_TRACT
  Filled 2014-09-17 (×4): qty 3

## 2014-09-17 MED ORDER — METOPROLOL TARTRATE 1 MG/ML IV SOLN
5.0000 mg | INTRAVENOUS | Status: AC
  Administered 2014-09-17: 5 mg via INTRAVENOUS

## 2014-09-17 MED ORDER — METOPROLOL TARTRATE 1 MG/ML IV SOLN
5.0000 mg | Freq: Four times a day (QID) | INTRAVENOUS | Status: DC | PRN
  Administered 2014-09-18: 5 mg via INTRAVENOUS
  Filled 2014-09-17 (×2): qty 5

## 2014-09-17 NOTE — Progress Notes (Signed)
PROGRESS NOTE    [name] DGL:[id number] DOB: Feb 25, 1941 DOA: 09/14/2014 PCP: [name], MD  HPI/Brief narrative 73 year old female patient with history of ESRD on TTS dialysis, HTN, CAD, lymphoma, A. Fib on amiodarone but not anticoagulation candidate, anxiety, recent admission for hypertensive emergency, readmitted with complaints of nausea, vomiting and abdominal pain. She also has generalized weakness. Last BM/flatus 48 hours ago. She tried stool softeners without any significant benefit. She makes small amount of urine,and denies dysuria. In the ED, imaging was suggestive of SBO versus ileus.   Assessment/Plan:  1. Abdominal pain secondary to SBO Vs Ileus & Fecal impaction: Surgery consultation and follow-up appreciated. Per Surgery, large stool ball in the rectum. Status post suppository and enema over the last 2 days with some BMs and improvement in symptoms. SBO resolved. As per surgery continue clears and bowel regimen. 2. ESRD/mild hyperkalemia: Nephrology consulted and patient underwent dialysis 11/18 & 11/19. Management per nephrology. Hyperkalemia resolved. 3. Essential hypertension: Recently admitted for hypertensive emergency. Blood pressures have improved postdialysis. Metoprolol which had been discontinued secondary to bradycardia has been resumed due to A. fib with RVR. Continue when necessary hydralazine. 4. A. Fib/heart block and bradycardia: Patient was in Mobitz type I second-degree AV block and sinus bradycardia in the 40s on 11/18 a.m. Amiodarone and metoprolol were held. Heart rate improved to sinus rhythm in the 80s. Patient went back into A. fib with RVR in the 100s on 11/19 night. Cardiology has resumed metoprolol. Monitor. Management per cardiology.  5. Anxiety: Continue when necessary Xanax. 6. Thrombocytopenia: Platelet counts lower at 64. Patient has had issues with intermittent low platelets and was 61 on 6/22. Platelet counts better at 77.   7. Failure to thrive: Palliative care team input appreciated. Patient wishes to continue medical care and DO NOT RESUSCITATE 8. History of non-Hodgkin's lymphoma: As per family, follows with oncology and is said to be in remission. 9. Dyspnea: Patient complained of dyspnea, wheezing and cough this morning. Patient appeared anxious and confused. Some of the confusion may be from pain meds she had received earlier and anxiety. Does not appear clinically volume overloaded. Chest x-ray was requested and negative for acute findings. Continue oxygen, when necessary Xopenex nebs and anxiolytics. Monitor.  Code Status: DO NOT RESUSCITATE  Family Communication: discussed with daughter andson-in-law at bedside. Disposition Plan:  Home when medically stable  Consultants:  General surgery  Nephrology  Cardiology  Palliative care team  Procedures:  hemodialysis  Antibiotics:  none   Subjective: Patient complaining of cough productive of? Minimal green sputum, dyspnea and wheezing this morning. No chest pain reported. States that she had 2 BMs after enema yesterday.  Objective: Filed Vitals:   09/16/14 1545 09/16/14 1820 09/16/14 2017 09/17/14 0528  BP: 128/61 135/62 146/49 141/45  Pulse: 95 99 45 72  Temp: 98.6 F (37 C) 98.4 F (36.9 C)  98.1 F (36.7 C)  TempSrc: Oral Oral    Resp: 17 18 15 15   Height:      Weight: 49.6 kg (109 lb 5.6 oz)  50.9 kg (112 lb 3.4 oz)   SpO2: 95% 92% 91% 96%    Intake/Output Summary (Last 24 hours) at 09/17/14 1630 Last data filed at 09/17/14 0930  Gross per 24 hour  Intake    105 ml  Output      2 ml  Net    103 ml   Filed Weights   09/16/14 1236 09/16/14 1545 09/16/14 2017  Weight: 53.2 kg (  117 lb 4.6 oz) 49.6 kg (109 lb 5.6 oz) 50.9 kg (112 lb 3.4 oz)     Exam:  General exam: pleasant elderly frail female, chronically ill-looking, looked worse this a.m. than she did yesterday. Respiratory system: Diminished breath sounds bilaterally  with scattered occasional rhonchi and basal crackles . No increased work of breathing. Cardiovascular system: S1 & S2 heard, RRR. No JVD, murmurs, gallops, clicks or pedal edema. Telemetry: A. fib with RVR in the 100s since 8 PM on 11/19. Gastrointestinal system: Abdomen is nondistended, soft and mild suprapubic tenderness without rigidity, guarding or rebound. Normal bowel sounds heard. Central nervous system: Alert and oriented. No focal neurological deficits. Extremities: Symmetric 5 x 5 power.   Data Reviewed: Basic Metabolic Panel:  Recent Labs Lab 09/14/14 1858 09/15/14 0615 09/16/14 0438 09/16/14 1248  NA 137 137 137  --   K 5.1 5.8* 4.9  --   CL 90* 90* 94*  --   CO2 24 24 20   --   GLUCOSE 164* 151* 79  --   BUN 60* 67* 35*  --   CREATININE 7.41* 7.78* 5.08*  --   CALCIUM 9.4 9.3 8.8  --   PHOS  --   --   --  4.6   Liver Function Tests:  Recent Labs Lab 09/14/14 1858 09/15/14 0615  AST 14 13  ALT 7 6  ALKPHOS 69 65  BILITOT 0.4 0.4  PROT 6.3 6.0  ALBUMIN 3.4* 3.3*    Recent Labs Lab 09/14/14 1858  LIPASE 33   No results for input(s): AMMONIA in the last 168 hours. CBC:  Recent Labs Lab 09/14/14 1858 09/15/14 0615 09/16/14 0438 09/17/14 1102  WBC 4.7 7.2 6.8 7.3  NEUTROABS 2.6  --   --   --   HGB 14.1 13.4 12.5 12.5  HCT 43.2 40.6 38.9 39.7  MCV 100.0 98.5 98.2 99.7  PLT 118* 107* 64* 77*   Cardiac Enzymes:  Recent Labs Lab 09/14/14 1858 09/17/14 1102  TROPONINI <0.30 <0.30   BNP (last 3 results)  Recent Labs  11/03/13 1655 09/07/14 0400  PROBNP 6465.0* >70000.0*   CBG:  Recent Labs Lab 09/15/14 0817  GLUCAP 165*    No results found for this or any previous visit (from the past 240 hour(s)).         Studies: Dg Chest Port 1 View  09/17/2014   CLINICAL DATA:  73 year old female with a history of dyspnea  EXAM: PORTABLE CHEST - 1 VIEW  COMPARISON:  09/14/2014, 09/07/2014, 07/07/2014  FINDINGS: Cardiomediastinal  silhouette unchanged in size and contour. Atherosclerotic calcifications of the aortic arch.  No confluent airspace disease. No visualized pneumothorax or pleural effusion.  Right hemidiaphragm elevated compared to the left.  Coarsened interstitial markings.  Single-lumen port catheter on the right chest wall with Huber needle in place. Catheter traverses a right IJ approach and terminates in the region of the upper right atrium.  No displaced fracture.  Unremarkable appearance of the upper abdomen.  IMPRESSION: No radiographic evidence of acute cardiopulmonary disease.  Atherosclerosis.  Elevation of the right hemidiaphragm, potentially positional.  Single-lumen port catheter on the right chest wall.  Signed,  Dulcy Fanny. Earleen Newport, DO  [facility]  [facility]   Electronically Signed   By: Corrie Mckusick D.O.   On: 09/17/2014 10:00   Dg Abd 2 Views  09/16/2014   CLINICAL DATA:  Partial small bowel obstruction/ileus.  EXAM: ABDOMEN - 2 VIEW  COMPARISON:  09/15/2014  FINDINGS: Further radiographic improvement present with essentially complete resolution of partial small bowel obstruction/ileus. There is further contrast transit into the colon. No dilated small bowel loops remain. No evidence of free intraperitoneal air or pneumatosis.  IMPRESSION: Resolution of partial small bowel obstruction/ileus.   Electronically Signed   By: Aletta Edouard M.D.   On: 09/16/2014 07:57        Scheduled Meds: . amLODipine  5 mg Oral Daily  . calcitRIOL  0.5 mcg Oral Q T,Th,[name]  . docusate sodium  100 mg Oral BID  . feeding supplement (NEPRO CARB STEADY)  237 mL Oral BID BM  . ferric gluconate (FERRLECIT/NULECIT) IV  62.5 mg Intravenous Weekly  . fluticasone  2 spray Each Nare [id number]  . heparin  5,000 Units Subcutaneous 3 times per day  . lanthanum  500 mg Oral TID WC  . metoprolol succinate  25 mg Oral Daily  . pantoprazole  40 mg Oral Daily  . rosuvastatin  10 mg  Oral QHS  . valACYclovir  500 mg Oral Q T,Th,Sat-1800   Continuous Infusions:   Principal Problem:   SBO (small bowel obstruction) Active Problems:   Large cell (diffuse) non-Hodgkin's lymphoma   Anemia of renal disease   ESRD on hemodialysis   Paroxysmal atrial fibrillation   CAD (coronary artery disease)   Palliative care encounter   Weakness generalized   Abdominal pain   Fecal impaction   Ileus    Time spent: 25 minutes    Hero Mccathern, MD, FACP, FHM. [facility] Pager [phone]  If 7PM-7AM, please contact night-coverage [email or web address] Password [email or web address] 09/17/2014, 4:30 PM    LOS: 3 days

## 2014-09-17 NOTE — Progress Notes (Signed)
Patient ID: [name], female   DOB: [birth year], 72 y.o.   MRN: [id number]    Subjective: Does not feel well today.  Staring off into space.  Difficult to get her to answer questions.  Does c/o chest pain/tightness and SOB.  Still has some rectal pain.  RN states she thinks she had 2 BMs yesterday, but does not know size  Objective: Vital signs in last 24 hours: Temp:  [98.1 F (36.7 C)-98.6 F (37 C)] 98.1 F (36.7 C) (11/20 0528) Pulse Rate:  [45-105] 72 (11/20 0528) Resp:  [15-18] 15 (11/20 0528) BP: (100-159)/(45-73) 141/45 mmHg (11/20 0528) SpO2:  [91 %-96 %] 96 % (11/20 0528) Weight:  [109 lb 5.6 oz (49.6 kg)-117 lb 4.6 oz (53.2 kg)] 112 lb 3.4 oz (50.9 kg) 2023/10/06 2017) Last BM Date: 05-Oct-2014  Intake/Output from previous day: 10/06/23 0701 - 11/20 0700 In: 105 [IV Piggyback:105] Out: 1509 [Stool:1] Intake/Output this shift:    PE: Abd: soft, less tender, +BS, ND Heart: irregular  Lab Results:   Recent Labs  09/15/14 0615 Oct 05, 2014 0438  WBC 7.2 6.8  HGB 13.4 12.5  HCT 40.6 38.9  PLT 107* 64*   BMET  Recent Labs  09/15/14 0615 10/05/2014 0438  NA 137 137  K 5.8* 4.9  CL 90* 94*  CO2 24 20  GLUCOSE 151* 79  BUN 67* 35*  CREATININE 7.78* 5.08*  CALCIUM 9.3 8.8   PT/INR  Recent Labs  09/15/14 0615  LABPROT 17.0*  INR 1.37   CMP     Component Value Date/Time   NA 137 Oct 05, 2014 0438   NA 142 09/03/2014 1217   K 4.9 October 05, 2014 0438   K 3.3 09/03/2014 1217   CL 94* 10-05-2014 0438   CL 95* 09/03/2014 1217   CO2 20 10-05-2014 0438   CO2 32 09/03/2014 1217   GLUCOSE 79 10-05-2014 0438   GLUCOSE 129* 09/03/2014 1217   BUN 35* 05-Oct-2014 0438   BUN 13 09/03/2014 1217   CREATININE 5.08* 10-05-14 0438   CREATININE 3.6* 09/03/2014 1217   CALCIUM 8.8 10/05/2014 0438   CALCIUM 8.9 09/03/2014 1217   PROT 6.0 09/15/2014 0615   PROT 6.3* 09/03/2014 1217   ALBUMIN 3.3* 09/15/2014 0615   AST 13 09/15/2014 0615   AST 18 09/03/2014 1217   ALT 6  09/15/2014 0615   ALT 13 09/03/2014 1217   ALKPHOS 65 09/15/2014 0615   ALKPHOS 54 09/03/2014 1217   BILITOT 0.4 09/15/2014 0615   BILITOT 0.50 09/03/2014 1217   GFRNONAA 8* 10-05-14 0438   GFRAA 9* October 05, 2014 0438   Lipase     Component Value Date/Time   LIPASE 33 09/14/2014 1858       Studies/Results: Dg Abd 2 Views  10/05/14   CLINICAL DATA:  Partial small bowel obstruction/ileus.  EXAM: ABDOMEN - 2 VIEW  COMPARISON:  09/15/2014  FINDINGS: Further radiographic improvement present with essentially complete resolution of partial small bowel obstruction/ileus. There is further contrast transit into the colon. No dilated small bowel loops remain. No evidence of free intraperitoneal air or pneumatosis.  IMPRESSION: Resolution of partial small bowel obstruction/ileus.   Electronically Signed   By: Aletta Edouard M.D.   On: 2014/10/05 07:57   Dg Abd 2 Views  09/15/2014   CLINICAL DATA:  Followup for small bowel obstruction.  EXAM: ABDOMEN - 2 VIEW  COMPARISON:  CT, 09/14/2014  FINDINGS: There is persistent small bowel dilation with air-fluid levels consistent with a moderate grade partial  small bowel obstruction.  No free air.  Surgical vascular clips are noted in the right lower quadrant. There are vascular calcifications along the aorta and iliac vessels.  IMPRESSION: 1. Persistent partial small bowel obstruction without significant change from the previous day's CT scan. No free air.   Electronically Signed   By: Lajean Manes M.D.   On: 09/15/2014 10:04    Anti-infectives: Anti-infectives    Start     Dose/Rate Route Frequency Ordered Stop   09/16/14 1800  valACYclovir (VALTREX) tablet 500 mg     500 mg Oral Every T-Th-Sa (1800) 09/15/14 0245         Assessment/Plan  1. Abdominal pain, improved 2. Ileus, improved 3. A fib 4. ESRD  Plan: 1. Will defer chest complaints to primary team and cardiology.  I was unaware cardiology was involved so I did page Dr. Algis Liming to  make him aware of the patient's new complaints.  Dr. [name] is currently addressing these. 2. Cont on clear liquids per the patient's request 3. Unclear if she has passed this large stool burden as it is unclear how much success she had yesterday.   LOS: 3 days    Tilly Pernice E 09/17/2014, 8:22 AM Pager: [phone]

## 2014-09-17 NOTE — Plan of Care (Signed)
Problem: Phase I Progression Outcomes Goal: Pain controlled with appropriate interventions Outcome: Completed/Met Date Met:  09/17/14     

## 2014-09-17 NOTE — Care Management Note (Signed)
CARE MANAGEMENT NOTE 09/17/2014  Patient:  [name], [name]   Account Number:  [id number]  Date Initiated:  09/17/2014  Documentation initiated by:  Padraig Nhan  Subjective/Objective Assessment:   CM following for progression and d/c planning.     Action/Plan:   09/17/2014 Met with and pt family , IM given pt very weak today Medication adjustments continue for A Fib and Bp control.   Anticipated DC Date:  09/23/2014   Anticipated DC Plan:  [place]         Choice offered to / List presented to:             [place].   Status of service:  In process, will continue to follow Medicare Important Message given?  YES (If response is "NO", the following Medicare IM given date fields will be blank) Date Medicare IM given:  09/17/2014 Medicare IM given by:  Sharron Petruska Date Additional Medicare IM given:   Additional Medicare IM given by:    Discharge Disposition:    Per UR Regulation:    If discussed at Long Length of Stay Meetings, dates discussed:    Comments:

## 2014-09-17 NOTE — Progress Notes (Signed)
Chaplain responded to spiritual care consult. Pt husband, daughter, and granddaughter at bedside. Pt said she was tired. Pt asked for prayer for her illness. Chaplain offered prayer. Chaplain recomends follow up by unit chaplain. Chaplain made pt and family aware of spiritual care services should they need them before follow up. Page chaplain as needed.   09/17/14 1300  Clinical Encounter Type  Visited With Patient and family together  Visit Type Initial;Spiritual support  Referral From Palliative care team  Consult/Referral To Chaplain  Recommendations Follow Up  Spiritual Encounters  Spiritual Needs Prayer;Emotional  Stress Factors  Patient Stress Factors Health changes  Taevyn Hausen, Barbette Hair, Chaplain 09/17/2014 1:30 PM

## 2014-09-17 NOTE — Progress Notes (Signed)
Subjective:  Not feeling well today.  She developed a cough and shortness of breath last night and was complaining of pain and shortness of breath this morning.  She went back into atrial fibrillation last night and has a somewhat rapid response.  Has just received a dose of fentanyl and is uncomfortable.  She is disoriented.  Objective:  Vital Signs in the last 24 hours: BP 141/45 mmHg  Pulse 72  Temp(Src) 98.1 F (36.7 C) (Oral)  Resp 15  Ht 5\' 2"  (1.575 m)  Wt 50.9 kg (112 lb 3.4 oz)  BMI 20.52 kg/m2  SpO2 96%  Physical Exam: Pleasant white female currently not But staring in space  Lungs: Mild wheeze  Cardiac: Irregular rhythm, normal S1 and S2, no S3 Extremities:  No edema present Neurologic: less tremor currently   Intake/Output from previous day: 11/19 0701 - 11/20 0700 In: 105 [IV Piggyback:105] Out: 1509 [Stool:1] Weight Filed Weights   09/16/14 1236 09/16/14 1545 09/16/14 2017  Weight: 53.2 kg (117 lb 4.6 oz) 49.6 kg (109 lb 5.6 oz) 50.9 kg (112 lb 3.4 oz)    Lab Results: Basic Metabolic Panel:  Recent Labs  09/15/14 0615 09/16/14 0438  NA 137 137  K 5.8* 4.9  CL 90* 94*  CO2 24 20  GLUCOSE 151* 79  BUN 67* 35*  CREATININE 7.78* 5.08*    CBC:  Recent Labs  09/14/14 1858 09/15/14 0615 09/16/14 0438  WBC 4.7 7.2 6.8  NEUTROABS 2.6  --   --   HGB 14.1 13.4 12.5  HCT 43.2 40.6 38.9  MCV 100.0 98.5 98.2  PLT 118* 107* 64*    BNP    Component Value Date/Time   PROBNP >70000.0* 09/07/2014 0400    PROTIME: Lab Results  Component Value Date   INR 1.37 09/15/2014   INR 1.08 08/25/2014   INR 0.89 12/19/2013    Telemetry: Currently atrial fibrillation with somewhat rapid ventricular response   Assessment/Plan:  1.  Second-degree AV block-type I improved with reduction in medications 2.  Recurrent atrial fibrillation following reduction in medications 3.  Chronic diastolic heart failure that may be worse following recurrent atrial  fibrillation. 4.   Tremor appears to be improved today  Recommendations:  She does not look good this morning.  Her daughter is at the bedside and is tearful.  She is in atrial fibrillation with somewhat uncontrolled response.  I would suggest intravenous beta blockers for the time being and then consider restart beta blockers.  May need to restart amiodarone, depending on her rhythm and clinical condition.        Kerry Hough  MD [facility]  09/17/2014, 8:17 AM

## 2014-09-17 NOTE — Progress Notes (Signed)
PT Cancellation Note  Patient Details Name: [name] MRN: [id number] DOB: 1941-10-10   Cancelled Treatment:    Reason Eval/Treat Not Completed: Fatigue/lethargy limiting ability to participate. Spoke with pt and family members at bedside. Pt feeling very poorly this morning and PT will hold at this time. Will check back as schedule allows for appropriateness to complete PT eval.    Rolinda Roan 09/17/2014, 10:43 AM   Rolinda Roan, PT, DPT Acute Rehabilitation Services Pager: [phone]

## 2014-09-17 NOTE — Progress Notes (Signed)
Progress Note from the Palliative Medicine Team at [facility] [place]: [name] is very sleepy today and is not feeling good. Family is at bedside and are upset as she has been talking to deceased relatives and talking about dying again. She awoke to her daughter earlier and told her that she thought she would have been gone by now. Daughter, [name], tells me that they have told her that it is okay for her to go. However, [name] then told them later she wants to live and continue dialysis. Her fluctuating decision being ready to die and then wanting to continue dialysis and live is making her family very emotional. They want to support her but have no idea which direction to go. [name] spoke with Dr. Jonnie Finner about it being time to possibly stop dialysis. [name] is very tearful and doesn't feel like she can make this decision for her mother as she doesn't know what she wants at this time. We agreed to give her today and just see if [name] will even tolerate dialysis tomorrow - the decision may be made for them and I believe this will be easier for them all. Continue to support during this confusing time for them. Treat pain and provide comfort is a priority.     Objective: Allergies  Allergen Reactions  . Advicor [Niacin-Lovastatin Er]     Muscle cramps  . Crestor [Rosuvastatin]     Muscle cramps  . Lipitor [Atorvastatin]     Muscle cramps  . Lovastatin     Muscle cramps  . Morphine And Related Other (See Comments)    "made me crazy"  . Niaspan [Niacin Er]     Muscle cramps  . Nsaids     Gi side effects  . Pravachol [Pravastatin Sodium]     Gi side effects  . Sulfa Antibiotics Other (See Comments)    Can't remember   . Vytorin [Ezetimibe-Simvastatin] Nausea Only  . Welchol [Colesevelam Hcl]     Muscle cramps  . Zetia [Ezetimibe]     Muscle cramps   . Zocor [Simvastatin]     Muscle cramps    Scheduled Meds: . amLODipine  5 mg Oral Daily  . calcitRIOL  0.5 mcg Oral  Q T,Th,[name]  . docusate sodium  100 mg Oral BID  . feeding supplement (NEPRO CARB STEADY)  237 mL Oral BID BM  . ferric gluconate (FERRLECIT/NULECIT) IV  62.5 mg Intravenous Weekly  . fluticasone  2 spray Each Nare BH-q7a  . heparin  5,000 Units Subcutaneous 3 times per day  . lanthanum  500 mg Oral TID WC  . metoprolol succinate  25 mg Oral Daily  . pantoprazole  40 mg Oral Daily  . rosuvastatin  10 mg Oral QHS  . valACYclovir  500 mg Oral Q T,Th,Sat-1800   Continuous Infusions:  PRN Meds:.acetaminophen **OR** acetaminophen, ALPRAZolam, alum & mag hydroxide-simeth, fentaNYL, fentaNYL, hydrALAZINE, levalbuterol, metoprolol, ondansetron (ZOFRAN) IV, oxyCODONE, sodium chloride  BP 141/45 mmHg  Pulse 72  Temp(Src) 98.1 F (36.7 C) (Oral)  Resp 15  Ht 5\' 2"  (1.575 m)  Wt 50.9 kg (112 lb 3.4 oz)  BMI 20.52 kg/m2  SpO2 96%   PPS: 30%   Intake/Output Summary (Last 24 hours) at 09/17/14 1526 Last data filed at 09/17/14 0930  Gross per 24 hour  Intake    105 ml  Output   1510 ml  Net  -1405 ml      LBM: 20%  Physical Exam:  General: NAD, thin, frail, ill-appearing HEENT: Muscle wasting, no JVD, moist mucous membranes Chest: Slightly labored breathing, congested cough, symmetric, Rt PAC accessed CVS: Irreg irreg Abdomen: Soft, tender to light palpation, ND Ext: MAE, no edema, warm to touch Neuro: Lethargic, oriented x 2, confused in conversation at times likely d/t pain medication   Labs: CBC    Component Value Date/Time   WBC 7.3 09/17/2014 1102   WBC 4.2 09/03/2014 1217   RBC 3.98 09/17/2014 1102   RBC 2.85* 09/03/2014 1217   RBC 2.97* 08/04/2013 0918   HGB 12.5 09/17/2014 1102   HGB 9.6* 09/03/2014 1217   HCT 39.7 09/17/2014 1102   HCT 30.7* 09/03/2014 1217   PLT 77* 09/17/2014 1102   PLT 112* 09/03/2014 1217   MCV 99.7 09/17/2014 1102   MCV 108* 09/03/2014 1217   MCH 31.4 09/17/2014 1102   MCH 33.7 09/03/2014 1217   MCHC 31.5 09/17/2014 1102   MCHC 31.3*  09/03/2014 1217   RDW 18.3* 09/17/2014 1102   RDW 15.4 09/03/2014 1217   LYMPHSABS 1.2 09/14/2014 1858   LYMPHSABS 1.3 09/03/2014 1217   MONOABS 0.8 09/14/2014 1858   EOSABS 0.0 09/14/2014 1858   EOSABS 0.0 09/03/2014 1217   BASOSABS 0.0 09/14/2014 1858   BASOSABS 0.0 09/03/2014 1217    BMET    Component Value Date/Time   NA 137 09/16/2014 0438   NA 142 09/03/2014 1217   K 4.9 09/16/2014 0438   K 3.3 09/03/2014 1217   CL 94* 09/16/2014 0438   CL 95* 09/03/2014 1217   CO2 20 09/16/2014 0438   CO2 32 09/03/2014 1217   GLUCOSE 79 09/16/2014 0438   GLUCOSE 129* 09/03/2014 1217   BUN 35* 09/16/2014 0438   BUN 13 09/03/2014 1217   CREATININE 5.08* 09/16/2014 0438   CREATININE 3.6* 09/03/2014 1217   CALCIUM 8.8 09/16/2014 0438   CALCIUM 8.9 09/03/2014 1217   GFRNONAA 8* 09/16/2014 0438   GFRAA 9* 09/16/2014 0438    CMP     Component Value Date/Time   NA 137 09/16/2014 0438   NA 142 09/03/2014 1217   K 4.9 09/16/2014 0438   K 3.3 09/03/2014 1217   CL 94* 09/16/2014 0438   CL 95* 09/03/2014 1217   CO2 20 09/16/2014 0438   CO2 32 09/03/2014 1217   GLUCOSE 79 09/16/2014 0438   GLUCOSE 129* 09/03/2014 1217   BUN 35* 09/16/2014 0438   BUN 13 09/03/2014 1217   CREATININE 5.08* 09/16/2014 0438   CREATININE 3.6* 09/03/2014 1217   CALCIUM 8.8 09/16/2014 0438   CALCIUM 8.9 09/03/2014 1217   PROT 6.0 09/15/2014 0615   PROT 6.3* 09/03/2014 1217   ALBUMIN 3.3* 09/15/2014 0615   AST 13 09/15/2014 0615   AST 18 09/03/2014 1217   ALT 6 09/15/2014 0615   ALT 13 09/03/2014 1217   ALKPHOS 65 09/15/2014 0615   ALKPHOS 54 09/03/2014 1217   BILITOT 0.4 09/15/2014 0615   BILITOT 0.50 09/03/2014 1217   GFRNONAA 8* 09/16/2014 0438   GFRAA 9* 09/16/2014 0438    Assessment and Plan: 1. Code Status: DNR 2. Symptom Control: 1. Anxiety/Agitation: Xanax 0.5 mg qhs prn.  2. Pain: Oxycodone 5 mg every 4 hours prn moderate pain. Fentanyl 25 mcg every 3 hours severe pain.  3. Bowel  Regimen: Colace daily. Would also recommend Senokot-S 2 tablets daily as well.   4. Gas pains: Maalox every 4 hours prn.  5. Weakness: PT consulted. Continue HD and medical management  as able.  6. Vertigo: She states that PT has helped with her vertigo in the past.  7. Malnutrition: Encourage frequent small meals. Nepro BID. She says she weighed ~240 lbs but has lost down to ~125 lbs over the past ~5 years. Has very little appetite 3. Psycho/Social: Emotional support provided to very distressed family members.  4. Spiritual: Chaplain consulted for support.  5. Disposition: To be determined.    Time In Time Out Total Time Spent with Patient Total Overall Time  1050 1125 73min 58min    Greater than 50%  of this time was spent counseling and coordinating care related to the above assessment and plan.  Vinie Sill, NP Palliative Medicine Team Pager # [phone] (M-F 8a-5p) Team Phone # [phone] (Nights/Weekends)   1

## 2014-09-17 NOTE — Progress Notes (Signed)
OT Cancellation Note  Patient Details Name: [name] MRN: [id number] DOB: [birth year]   Cancelled Treatment:    Reason Eval/Treat Not Completed: Fatigue/lethargy limiting ability to participate. Will continue to follow and initiate OT as appropriate.  Malka So 09/17/2014, 2:34 PM  [phone]

## 2014-09-17 NOTE — Progress Notes (Signed)
Subjective:   Doesn't feel well. Not answering questions. Daughter at bedside  Objective Filed Vitals:   09/16/14 1545 09/16/14 1820 09/16/14 2017 09/17/14 0528  BP: 128/61 135/62 146/49 141/45  Pulse: 95 99 45 72  Temp: 98.6 F (37 C) 98.4 F (36.9 C)  98.1 F (36.7 C)  TempSrc: Oral Oral    Resp: 17 18 15 15   Height:      Weight: 49.6 kg (109 lb 5.6 oz)  50.9 kg (112 lb 3.4 oz)   SpO2: 95% 92% 91% 96%   Physical Exam General: opens eyes, not answering questions, no acute distress.  Heart: irreg Lungs: CTA. Unlabored. Shallow- ant only Abdomen: soft, nontender +BS  Extremities: no edema  Dialysis Access: L AVf +b/t  Dialysis Orders: TTS @ [facility] 54kgs 2K/2Ca 4 hr 3500 heparin L AVF 400/800 Calcitriol 0.50 mcg Aranesp 200 q week Venofer 50 q week  Assessment/Plan: 1. Abdominal pain- improved BM yesterday. Clears. Surgery following 2. ESRD - TTS @ [facility]. HD tomorrow. Daughter worried about her ability to tolerate HD 3. Hypertension/volume - 141/45-  Poor appetite, edw recently lowered, likely continues to lose wt but no volume excess- 3kgs under edw 4. Afib - metop restarted after sob and chest pain- in afib overnight. amiodarone currently on hold  5. Anemia - hgb 12.5 hold aranesp. Cont weekly Fe. Watch CBC- baseline hgb outpt 8-9s 6. Metabolic bone disease - FB+5.1. Phos 4.6 Not tolerating renvela,- changed to fosrenol. Cont calcitriol. 7. Nutrition - Alb 3.3.clear liquid. nepro 8. Anxiety- xanax 9. Goal of care- palliative care consulted, wishes to continue HD for now  Shelle Iron, NP [place] [phone] 09/17/2014,9:51 AM  LOS: 3 days   Pt seen, examined and agree w A/P as above.  Kelly Splinter MD pager [phone]    cell [phone] 09/17/2014, 11:25 AM    Additional Objective Labs: Basic Metabolic Panel:  Recent Labs Lab 09/14/14 1858 09/15/14 0615 09/16/14 0438 09/16/14 1248  NA 137 137 137  --   K  5.1 5.8* 4.9  --   CL 90* 90* 94*  --   CO2 24 24 20   --   GLUCOSE 164* 151* 79  --   BUN 60* 67* 35*  --   CREATININE 7.41* 7.78* 5.08*  --   CALCIUM 9.4 9.3 8.8  --   PHOS  --   --   --  4.6   Liver Function Tests:  Recent Labs Lab 09/14/14 1858 09/15/14 0615  AST 14 13  ALT 7 6  ALKPHOS 69 65  BILITOT 0.4 0.4  PROT 6.3 6.0  ALBUMIN 3.4* 3.3*    Recent Labs Lab 09/14/14 1858  LIPASE 33   CBC:  Recent Labs Lab 09/14/14 1858 09/15/14 0615 09/16/14 0438  WBC 4.7 7.2 6.8  NEUTROABS 2.6  --   --   HGB 14.1 13.4 12.5  HCT 43.2 40.6 38.9  MCV 100.0 98.5 98.2  PLT 118* 107* 64*   Blood Culture    Component Value Date/Time   SDES BLOOD RIGHT CHEST PORTA CATH 07/17/2014 0500   SPECREQUEST  07/17/2014 0500    BOTTLES DRAWN AEROBIC AND ANAEROBIC 8CC BLUE,5CC RED   CULT  07/17/2014 0500    NO GROWTH 5 DAYS Performed at [facility] 07/23/2014 FINAL 07/17/2014 0500    Cardiac Enzymes:  Recent Labs Lab 09/14/14 1858  TROPONINI <0.30   CBG:  Recent Labs Lab 09/15/14 0817  GLUCAP 165*  Iron Studies: No results for input(s): IRON, TIBC, TRANSFERRIN, FERRITIN in the last 72 hours. @lablastinr3 @ Studies/Results: Dg Abd 2 Views  10/04/2014   CLINICAL DATA:  Partial small bowel obstruction/ileus.  EXAM: ABDOMEN - 2 VIEW  COMPARISON:  09/15/2014  FINDINGS: Further radiographic improvement present with essentially complete resolution of partial small bowel obstruction/ileus. There is further contrast transit into the colon. No dilated small bowel loops remain. No evidence of free intraperitoneal air or pneumatosis.  IMPRESSION: Resolution of partial small bowel obstruction/ileus.   Electronically Signed   By: Aletta Edouard M.D.   On: Oct 04, 2014 07:57   Dg Abd 2 Views  09/15/2014   CLINICAL DATA:  Followup for small bowel obstruction.  EXAM: ABDOMEN - 2 VIEW  COMPARISON:  CT, 09/14/2014  FINDINGS: There is persistent small bowel dilation  with air-fluid levels consistent with a moderate grade partial small bowel obstruction.  No free air.  Surgical vascular clips are noted in the right lower quadrant. There are vascular calcifications along the aorta and iliac vessels.  IMPRESSION: 1. Persistent partial small bowel obstruction without significant change from the previous day's CT scan. No free air.   Electronically Signed   By: Lajean Manes M.D.   On: 09/15/2014 10:04   Medications:   . amLODipine  5 mg Oral Daily  . calcitRIOL  0.5 mcg Oral Q T,Th,[name]  . docusate sodium  100 mg Oral BID  . feeding supplement (NEPRO CARB STEADY)  237 mL Oral BID BM  . ferric gluconate (FERRLECIT/NULECIT) IV  62.5 mg Intravenous Weekly  . fluticasone  2 spray Each Nare BH-q7a  . heparin  5,000 Units Subcutaneous 3 times per day  . lanthanum  500 mg Oral TID WC  . metoprolol succinate  25 mg Oral Daily  . pantoprazole  40 mg Oral Daily  . rosuvastatin  10 mg Oral QHS  . valACYclovir  500 mg Oral Q T,Th,Sat-1800

## 2014-09-18 DIAGNOSIS — G9340 Encephalopathy, unspecified: Secondary | ICD-10-CM | POA: Insufficient documentation

## 2014-09-18 LAB — RENAL FUNCTION PANEL
Albumin: 2.7 g/dL — ABNORMAL LOW (ref 3.5–5.2)
Anion gap: 21 — ABNORMAL HIGH (ref 5–15)
BUN: 54 mg/dL — AB (ref 6–23)
CALCIUM: 8.8 mg/dL (ref 8.4–10.5)
CHLORIDE: 97 meq/L (ref 96–112)
CO2: 23 mEq/L (ref 19–32)
CREATININE: 6.36 mg/dL — AB (ref 0.50–1.10)
GFR calc Af Amer: 7 mL/min — ABNORMAL LOW (ref >90)
GFR calc non Af Amer: 6 mL/min — ABNORMAL LOW (ref >90)
GLUCOSE: 112 mg/dL — AB (ref 70–99)
Phosphorus: 4.5 mg/dL (ref 2.3–4.6)
Potassium: 4.9 mEq/L (ref 3.7–5.3)
Sodium: 141 mEq/L (ref 137–147)

## 2014-09-18 LAB — CBC
HEMATOCRIT: 37.8 % (ref 36.0–46.0)
Hemoglobin: 11.9 g/dL — ABNORMAL LOW (ref 12.0–15.0)
MCH: 31.1 pg (ref 26.0–34.0)
MCHC: 31.5 g/dL (ref 30.0–36.0)
MCV: 98.7 fL (ref 78.0–100.0)
Platelets: 103 10*3/uL — ABNORMAL LOW (ref 150–400)
RBC: 3.83 MIL/uL — AB (ref 3.87–5.11)
RDW: 18.2 % — ABNORMAL HIGH (ref 11.5–15.5)
WBC: 7.4 10*3/uL (ref 4.0–10.5)

## 2014-09-18 MED ORDER — DILTIAZEM HCL 25 MG/5ML IV SOLN
10.0000 mg | INTRAVENOUS | Status: AC
  Administered 2014-09-18: 5 mg via INTRAVENOUS
  Filled 2014-09-18: qty 5

## 2014-09-18 MED ORDER — METOPROLOL SUCCINATE ER 25 MG PO TB24
25.0000 mg | ORAL_TABLET | Freq: Two times a day (BID) | ORAL | Status: DC
  Administered 2014-09-18 – 2014-09-19 (×2): 25 mg via ORAL
  Filled 2014-09-18 (×4): qty 1

## 2014-09-18 MED ORDER — AMIODARONE HCL 200 MG PO TABS
200.0000 mg | ORAL_TABLET | Freq: Two times a day (BID) | ORAL | Status: DC
  Administered 2014-09-18 – 2014-09-21 (×7): 200 mg via ORAL
  Filled 2014-09-18 (×10): qty 1

## 2014-09-18 MED ORDER — METOPROLOL TARTRATE 1 MG/ML IV SOLN
INTRAVENOUS | Status: AC
  Filled 2014-09-18: qty 5

## 2014-09-18 MED ORDER — DIGOXIN 0.25 MG/ML IJ SOLN
0.2500 mg | Freq: Once | INTRAMUSCULAR | Status: AC
  Administered 2014-09-18: 0.25 mg via INTRAVENOUS
  Filled 2014-09-18 (×2): qty 1

## 2014-09-18 NOTE — Progress Notes (Signed)
PT Cancellation Note  Patient Details Name: [name] MRN: [id number] DOB: 11-02-1940   Cancelled Treatment:      Patient at procedure or test/ unavailable, at HD   [name] 09/18/2014, 10:17 AM Alben Deeds, PT DPT  [phone]

## 2014-09-18 NOTE — Progress Notes (Addendum)
Subjective:   "I don't know where I am", "I want to see my husband".  HFR 130 - 140's on HD today, no CP , SOB or other complaints  Objective Filed Vitals:   09/18/14 0830 09/18/14 0835 09/18/14 0911 09/18/14 0930  BP: 133/67  130/79 129/106  Pulse: 98  135 130  Temp:      TempSrc:      Resp: 19  12 21   Height:      Weight:  52.6 kg (115 lb 15.4 oz)    SpO2:       Physical Exam General: calm, not in distress Heart: irreg Lungs: CTA bilat, occ cough, unlabored. Shallow- ant only Abdomen: soft, nontender +BS  Extremities: no edema  Neuro: +myoclonic jerking off and on, mild, confused, does not know where she is Dialysis Access: L AVf +b/t  Dialysis Orders: TTS @ [facility] 54kgs 2K/2Ca 4 hr 3500 heparin L AVF 400/800 Calcitriol 0.50 mcg Aranesp 200 q week Venofer 50 q week  Assessment: 1. Altered mental status- with myoclonic jerking, this is due to narcotic accumulation in an ESRD patient 2. Abdominal pain- improved. Clears. Surgery following 3. ESRD - TTS 4. Hypertension/volume - 141/45-  Below dry wt, no vol excess on exam 5. Afib / RVR - metop restarted after sob and chest pain, amio on hold.   6. Anemia - hgb 12.5 hold aranesp. Cont weekly Fe. Watch CBC- baseline hgb outpt 8-9s 7. Metabolic bone disease - UX+3.2. Phos 4.6 Not tolerating renvela,- changed to fosrenol. Cont calcitriol. 8. Nutrition - Alb 3.3.clear liquid. nepro 9. Goals of care - I had conversation with family yesterday about EOL issues w relation to HD and withdrawal of dialysis, they're questions were answered. The pt herself is vacillating back and forth between continuing and not continuing dialysis. On HD now and confused due to narcotic accumulation for pain, mostly abd pain I believe.   Plan - Tachy on HD. Not pulling any fluid on HD so that should not be affecting HR.  K+ also was not low so low K should not be contributing. Tried IV BB w/o effect, trying IV dilt now. Clinically  stable. Have d/w primary MD about AMS/ myoclonus - will hold all narcotics/ Xanax, etc for now, let her come around. Not sure why she is on Valtrex, but if possible would stop this as well, can accumulate in ESRD and even though it is properly dosed, unless it absolutely needed would hold for now.  Also, continue conversation with pt and family re: EOL and pt's wishes to continue or not continue HD.     Kelly Splinter MD pager [phone]    cell [phone] 09/18/2014, 10:13 AM    Additional Objective Labs: Basic Metabolic Panel:  Recent Labs Lab 09/14/14 1858 09/15/14 0615 09/16/14 0438 09/16/14 1248  NA 137 137 137  --   K 5.1 5.8* 4.9  --   CL 90* 90* 94*  --   CO2 24 24 20   --   GLUCOSE 164* 151* 79  --   BUN 60* 67* 35*  --   CREATININE 7.41* 7.78* 5.08*  --   CALCIUM 9.4 9.3 8.8  --   PHOS  --   --   --  4.6   Liver Function Tests:  Recent Labs Lab 09/14/14 1858 09/15/14 0615  AST 14 13  ALT 7 6  ALKPHOS 69 65  BILITOT 0.4 0.4  PROT 6.3 6.0  ALBUMIN 3.4* 3.3*    Recent Labs  Lab 09/14/14 1858  LIPASE 33   CBC:  Recent Labs Lab 09/14/14 1858 09/15/14 0615 09/16/14 0438 09/17/14 1102 09/18/14 0940  WBC 4.7 7.2 6.8 7.3 7.4  NEUTROABS 2.6  --   --   --   --   HGB 14.1 13.4 12.5 12.5 11.9*  HCT 43.2 40.6 38.9 39.7 37.8  MCV 100.0 98.5 98.2 99.7 98.7  PLT 118* 107* 64* 77* 103*   Blood Culture    Component Value Date/Time   SDES BLOOD RIGHT CHEST PORTA CATH 07/17/2014 0500   SPECREQUEST  07/17/2014 0500    BOTTLES DRAWN AEROBIC AND ANAEROBIC 8CC BLUE,5CC RED   CULT  07/17/2014 0500    NO GROWTH 5 DAYS Performed at [place] 07/23/2014 FINAL 07/17/2014 0500    Cardiac Enzymes:  Recent Labs Lab 09/14/14 1858 09/17/14 1102 09/17/14 1550 09/17/14 2208  TROPONINI <0.30 <0.30 <0.30 <0.30   CBG:  Recent Labs Lab 09/15/14 0817  GLUCAP 165*   Iron Studies: No results for input(s): IRON, TIBC, TRANSFERRIN, FERRITIN  in the last 72 hours. @lablastinr3 @ Studies/Results: Dg Chest Port 1 View  09/17/2014   CLINICAL DATA:  73 year old female with a history of dyspnea  EXAM: PORTABLE CHEST - 1 VIEW  COMPARISON:  09/14/2014, 09/07/2014, 07/07/2014  FINDINGS: Cardiomediastinal silhouette unchanged in size and contour. Atherosclerotic calcifications of the aortic arch.  No confluent airspace disease. No visualized pneumothorax or pleural effusion.  Right hemidiaphragm elevated compared to the left.  Coarsened interstitial markings.  Single-lumen port catheter on the right chest wall with Huber needle in place. Catheter traverses a right IJ approach and terminates in the region of the upper right atrium.  No displaced fracture.  Unremarkable appearance of the upper abdomen.  IMPRESSION: No radiographic evidence of acute cardiopulmonary disease.  Atherosclerosis.  Elevation of the right hemidiaphragm, potentially positional.  Single-lumen port catheter on the right chest wall.  Signed,  Dulcy Fanny. Earleen Newport, DO  [facility]  [facility]   Electronically Signed   By: Corrie Mckusick D.O.   On: 09/17/2014 10:00   Medications:   . amLODipine  5 mg Oral Daily  . calcitRIOL  0.5 mcg Oral Q T,Th,[name]  . diltiazem  10 mg Intravenous STAT  . docusate sodium  100 mg Oral BID  . feeding supplement (NEPRO CARB STEADY)  237 mL Oral BID BM  . ferric gluconate (FERRLECIT/NULECIT) IV  62.5 mg Intravenous Weekly  . fluticasone  2 spray Each Nare BH-q7a  . heparin  5,000 Units Subcutaneous 3 times per day  . lanthanum  500 mg Oral TID WC  . metoprolol succinate  25 mg Oral Daily  . pantoprazole  40 mg Oral Daily  . rosuvastatin  10 mg Oral QHS  . valACYclovir  500 mg Oral Q T,Th,Sat-1800

## 2014-09-18 NOTE — Progress Notes (Signed)
Subjective:  Disoriented and on dialysis now.  Still in rapid atrial fibrillation.  Objective:  Vital Signs in the last 24 hours: BP 126/91 mmHg  Pulse 144  Temp(Src) 98.5 F (36.9 C) (Oral)  Resp 17  Ht 5\' 2"  (1.575 m)  Wt 52.6 kg (115 lb 15.4 oz)  BMI 21.20 kg/m2  SpO2 92%  Physical Exam: Elderly WF confused Lungs: Mild wheeze  Cardiac: Rapid irregular rhythm, normal S1 and S2, no S3 Extremities:  No edema present  Intake/Output from previous day: 11/20 0701 - 11/21 0700 In: 0  Out: 1 [Stool:1] Weight Filed Weights   09/16/14 2017 09/17/14 2047 09/18/14 0835  Weight: 50.9 kg (112 lb 3.4 oz) 53.071 kg (117 lb) 52.6 kg (115 lb 15.4 oz)    Lab Results: Basic Metabolic Panel:  Recent Labs  09/16/14 0438 09/18/14 0945  NA 137 141  K 4.9 4.9  CL 94* 97  CO2 20 23  GLUCOSE 79 112*  BUN 35* 54*  CREATININE 5.08* 6.36*    CBC:  Recent Labs  09/17/14 1102 09/18/14 0940  WBC 7.3 7.4  HGB 12.5 11.9*  HCT 39.7 37.8  MCV 99.7 98.7  PLT 77* 103*    BNP    Component Value Date/Time   PROBNP >70000.0* 09/07/2014 0400    PROTIME: Lab Results  Component Value Date   INR 1.37 09/15/2014   INR 1.08 08/25/2014   INR 0.89 12/19/2013    Telemetry: Currently atrial fibrillation with somewhat rapid ventricular response   Assessment/Plan:  1.  Second-degree AV block-type I improved with reduction in medications but now in rapid afib 2.  Recurrent atrial fibrillation following reduction in medications  Recommendations:  Currently she is not rate controlled.  I think we will need to restart amiodarone for now and watch for bradycardia.  Back on beta blockers and I will give a single dose of dig to help with rate control.   Kerry Hough  MD [facility]  09/18/2014, 11:06 AM

## 2014-09-18 NOTE — Procedures (Signed)
I was present at this dialysis session, have reviewed the session itself and made  appropriate changes  Kelly Splinter MD (pgr) [phone]    ([phone] 09/18/2014, 10:50 AM

## 2014-09-18 NOTE — Progress Notes (Signed)
[facility], MD present. Pt tachy up to 145 bpm.  Ordered to give Metoprolol 5mg  PRN. Orvil Feil, NP aware. Pt resting, no c/o. Vss otherwise.

## 2014-09-18 NOTE — Progress Notes (Signed)
PROGRESS NOTE    [name]:[id number] DOB: 12/18/1940 DOA: 09/14/2014 PCP: [name], MD  HPI/Brief narrative 73 year old female patient with history of ESRD on TTS dialysis, HTN, CAD, lymphoma, A. Fib on amiodarone but not anticoagulation candidate, anxiety, recent admission for hypertensive emergency, readmitted with complaints of nausea, vomiting and abdominal pain. She also has generalized weakness. Last BM/flatus 48 hours ago. She tried stool softeners without any significant benefit. She makes small amount of urine,and denies dysuria. In the ED, imaging was suggestive of SBO versus ileus.   Assessment/Plan:  1. Abdominal pain secondary to SBO Vs Ileus & Fecal impaction: Surgery consultation and follow-up appreciated. Per Surgery, large stool ball in the rectum. Status post suppository and enema over the last 2 days with some BMs and improvement in symptoms. SBO resolved. As per surgery continue clears and bowel regimen. Requested surgery to see gain today re fecal impaction. 2. ESRD/mild hyperkalemia: Nephrology consulted and patient underwent dialysis 11/18 & 11/19. Management per nephrology. Hyperkalemia resolved. Discussed with Dr. Jonnie Finner. 3. Essential hypertension: Recently admitted for hypertensive emergency. Blood pressures have improved postdialysis. Metoprolol which had been discontinued secondary to bradycardia has been resumed due to A. fib with RVR. Continue when necessary hydralazine. 4. Mobitz type I second-degree AV block and bradycardia: Patient was in Mobitz type I second-degree AV block and sinus bradycardia in the 40s on 11/18 a.m. Amiodarone and metoprolol were temporarily held. Resolved. 5. A. fib with RVR: Cardiology following. Initially started back on metoprolol but worsening this morning at HD. Being resumed on amiodarone and digoxin 1. Monitor 6. Anxiety: Xanax being held secondary to altered mental status. 7. Thrombocytopenia: Platelet counts  lower at 64. Patient has had issues with intermittent low platelets and was 61 on 6/22. Platelet counts better at 103.  8. Failure to thrive: Palliative care team input appreciated. Patient wishes to continue medical care and DO NOT RESUSCITATE 27. History of non-Hodgkin's lymphoma: As per family, follows with oncology and is said to be in remission. 10. Dyspnea: Patient complained of dyspnea, wheezing and cough this morning. Patient appeared anxious and confused. Some of the confusion may be from pain meds she had received earlier and anxiety. Does not appear clinically volume overloaded. Chest x-ray was requested and negative for acute findings. Continue oxygen, when necessary Xopenex nebs and anxiolytics. Resolved. 11. Acute encephalopathy: Noted to be somnolent at HD this morning. Likely secondary to pain medications in ESRD patient. Holding all sedative medications. Monitor closely.  Code Status: DO NOT RESUSCITATE  Family Communication: discussed with daughter and son-in-law at bedside on 11/20. None at bedside Disposition Plan:  Not medically stable for DC  Consultants:  General surgery  Nephrology  Cardiology  Palliative care team  Procedures:  hemodialysis  Antibiotics:  none   Subjective: Seen at dialysis this morning. Somnolent but arousable. Denies dyspnea, chest pain or cough. Did not report abdominal pain. Cannot tell if she had a BM since yesterday.  Objective: Filed Vitals:   09/18/14 1030 09/18/14 1100 09/18/14 1156 09/18/14 1348  BP: 126/91 127/85 138/99 141/82  Pulse: 144 134 148 140  Temp:   98.1 F (36.7 C) 98.8 F (37.1 C)  TempSrc:   Oral Oral  Resp: 17 19 22 20   Height:      Weight:      SpO2:   100% 97%    Intake/Output Summary (Last 24 hours) at 09/18/14 1420 Last data filed at 09/18/14 1156  Gross per 24 hour  Intake  0 ml  Output      0 ml  Net      0 ml   Filed Weights   09/16/14 2017 09/17/14 2047 09/18/14 0835  Weight: 50.9 kg  (112 lb 3.4 oz) 53.071 kg (117 lb) 52.6 kg (115 lb 15.4 oz)     Exam:  General exam: pleasant elderly frail female, chronically ill-looking, seen at dialysis, somnolent but arousable & appears comfortable and in no obvious distress. Respiratory system: Clear to auscultation. No increased work of breathing. Cardiovascular system: S1 & S2 heard, RRR. No JVD, murmurs, gallops, clicks or pedal edema. Telemetry: A. fib with RVR in the 140s. Gastrointestinal system: Abdomen is nondistended, soft and nontender. Normal bowel sounds heard. Central nervous system: Somnolent but easily arousable and oriented only to self. No focal neurological deficits. Extremities: Symmetric 5 x 5 power. No involuntary movements noticed during my visit.   Data Reviewed: Basic Metabolic Panel:  Recent Labs Lab 09/14/14 1858 09/15/14 0615 09/16/14 0438 09/16/14 1248 09/18/14 0945  NA 137 137 137  --  141  K 5.1 5.8* 4.9  --  4.9  CL 90* 90* 94*  --  97  CO2 24 24 20   --  23  GLUCOSE 164* 151* 79  --  112*  BUN 60* 67* 35*  --  54*  CREATININE 7.41* 7.78* 5.08*  --  6.36*  CALCIUM 9.4 9.3 8.8  --  8.8  PHOS  --   --   --  4.6 4.5   Liver Function Tests:  Recent Labs Lab 09/14/14 1858 09/15/14 0615 09/18/14 0945  AST 14 13  --   ALT 7 6  --   ALKPHOS 69 65  --   BILITOT 0.4 0.4  --   PROT 6.3 6.0  --   ALBUMIN 3.4* 3.3* 2.7*    Recent Labs Lab 09/14/14 1858  LIPASE 33   No results for input(s): AMMONIA in the last 168 hours. CBC:  Recent Labs Lab 09/14/14 1858 09/15/14 0615 09/16/14 0438 09/17/14 1102 09/18/14 0940  WBC 4.7 7.2 6.8 7.3 7.4  NEUTROABS 2.6  --   --   --   --   HGB 14.1 13.4 12.5 12.5 11.9*  HCT 43.2 40.6 38.9 39.7 37.8  MCV 100.0 98.5 98.2 99.7 98.7  PLT 118* 107* 64* 77* 103*   Cardiac Enzymes:  Recent Labs Lab 09/14/14 1858 09/17/14 1102 09/17/14 1550 09/17/14 2208  TROPONINI <0.30 <0.30 <0.30 <0.30   BNP (last 3 results)  Recent Labs   11/03/13 1655 09/07/14 0400  PROBNP 6465.0* >70000.0*   CBG:  Recent Labs Lab 09/15/14 0817  GLUCAP 165*    No results found for this or any previous visit (from the past 240 hour(s)).         Studies: Dg Chest Port 1 View  09/17/2014   CLINICAL DATA:  73 year old female with a history of dyspnea  EXAM: PORTABLE CHEST - 1 VIEW  COMPARISON:  09/14/2014, 09/07/2014, 07/07/2014  FINDINGS: Cardiomediastinal silhouette unchanged in size and contour. Atherosclerotic calcifications of the aortic arch.  No confluent airspace disease. No visualized pneumothorax or pleural effusion.  Right hemidiaphragm elevated compared to the left.  Coarsened interstitial markings.  Single-lumen port catheter on the right chest wall with Huber needle in place. Catheter traverses a right IJ approach and terminates in the region of the upper right atrium.  No displaced fracture.  Unremarkable appearance of the upper abdomen.  IMPRESSION: No radiographic evidence of acute cardiopulmonary  disease.  Atherosclerosis.  Elevation of the right hemidiaphragm, potentially positional.  Single-lumen port catheter on the right chest wall.  Signed,  Dulcy Fanny. Earleen Newport, DO  [facility]  [facility]   Electronically Signed   By: Corrie Mckusick D.O.   On: 09/17/2014 10:00        Scheduled Meds: . amiodarone  200 mg Oral BID  . amLODipine  5 mg Oral Daily  . calcitRIOL  0.5 mcg Oral Q T,Th,[name]  . docusate sodium  100 mg Oral BID  . feeding supplement (NEPRO CARB STEADY)  237 mL Oral BID BM  . ferric gluconate (FERRLECIT/NULECIT) IV  62.5 mg Intravenous Weekly  . fluticasone  2 spray Each Nare BH-q7a  . heparin  5,000 Units Subcutaneous 3 times per day  . lanthanum  500 mg Oral TID WC  . metoprolol succinate  25 mg Oral BID  . pantoprazole  40 mg Oral Daily  . rosuvastatin  10 mg Oral QHS  . valACYclovir  500 mg Oral Q T,Th,Sat-1800   Continuous Infusions:   Principal  Problem:   SBO (small bowel obstruction) Active Problems:   Large cell (diffuse) non-Hodgkin's lymphoma   Anemia of renal disease   ESRD on hemodialysis   Paroxysmal atrial fibrillation   CAD (coronary artery disease)   Palliative care encounter   Weakness generalized   Abdominal pain   Fecal impaction   Ileus   Dyspnea    Time spent: 25 minutes    Shaquia Berkley, MD, FACP, FHM. [facility] Pager [phone]  If 7PM-7AM, please contact night-coverage [email or web address] Password [email or web address] 09/18/2014, 2:20 PM    LOS: 4 days

## 2014-09-19 ENCOUNTER — Inpatient Hospital Stay (HOSPITAL_COMMUNITY): Payer: Medicare Other

## 2014-09-19 DIAGNOSIS — R05 Cough: Secondary | ICD-10-CM

## 2014-09-19 MED ORDER — HYDROCORTISONE 2.5 % RE CREA
TOPICAL_CREAM | Freq: Two times a day (BID) | RECTAL | Status: DC
  Administered 2014-09-19: 12:00:00 via RECTAL
  Administered 2014-09-19 – 2014-09-20 (×2): 1 via RECTAL
  Administered 2014-09-20 – 2014-09-21 (×2): via RECTAL
  Filled 2014-09-19: qty 28.35

## 2014-09-19 MED ORDER — METOPROLOL SUCCINATE ER 25 MG PO TB24
37.5000 mg | ORAL_TABLET | Freq: Two times a day (BID) | ORAL | Status: DC
  Administered 2014-09-19 – 2014-09-21 (×4): 37.5 mg via ORAL
  Filled 2014-09-19 (×6): qty 1

## 2014-09-19 NOTE — Progress Notes (Signed)
Patient ID: [name], female   DOB: Apr 01, 1941, 73 y.o.   MRN: [id number]    Subjective: Tolerating full liquids.  Passing some stool.  Rectal pain secondary to hemorrhoids  Objective: Vital signs in last 24 hours: Temp:  [98.1 F (36.7 C)-99.6 F (37.6 C)] 99 F (37.2 C) (11/22 1008) Pulse Rate:  [73-148] 83 (11/22 1008) Resp:  [18-22] 18 (11/22 1008) BP: (110-165)/(67-114) 116/83 mmHg (11/22 1008) SpO2:  [97 %-100 %] 98 % (11/22 1008) Weight:  [119 lb 4.8 oz (54.114 kg)] 119 lb 4.8 oz (54.114 kg) (11/21 2053) Last BM Date: 09/18/14  Intake/Output from previous day: 11/21 0701 - 11/22 0700 In: 270 [P.O.:270] Out: 0  Intake/Output this shift:    PE: Abd: soft, NT, ND Rectal: small hemorrhoid that is tender, no thrombosis noted  Lab Results:   Recent Labs  09/17/14 1102 09/18/14 0940  WBC 7.3 7.4  HGB 12.5 11.9*  HCT 39.7 37.8  PLT 77* 103*   BMET  Recent Labs  09/18/14 0945  NA 141  K 4.9  CL 97  CO2 23  GLUCOSE 112*  BUN 54*  CREATININE 6.36*  CALCIUM 8.8   PT/INR No results for input(s): LABPROT, INR in the last 72 hours. CMP     Component Value Date/Time   NA 141 09/18/2014 0945   NA 142 09/03/2014 1217   K 4.9 09/18/2014 0945   K 3.3 09/03/2014 1217   CL 97 09/18/2014 0945   CL 95* 09/03/2014 1217   CO2 23 09/18/2014 0945   CO2 32 09/03/2014 1217   GLUCOSE 112* 09/18/2014 0945   GLUCOSE 129* 09/03/2014 1217   BUN 54* 09/18/2014 0945   BUN 13 09/03/2014 1217   CREATININE 6.36* 09/18/2014 0945   CREATININE 3.6* 09/03/2014 1217   CALCIUM 8.8 09/18/2014 0945   CALCIUM 8.9 09/03/2014 1217   PROT 6.0 09/15/2014 0615   PROT 6.3* 09/03/2014 1217   ALBUMIN 2.7* 09/18/2014 0945   AST 13 09/15/2014 0615   AST 18 09/03/2014 1217   ALT 6 09/15/2014 0615   ALT 13 09/03/2014 1217   ALKPHOS 65 09/15/2014 0615   ALKPHOS 54 09/03/2014 1217   BILITOT 0.4 09/15/2014 0615   BILITOT 0.50 09/03/2014 1217   GFRNONAA 6* 09/18/2014 0945   GFRAA  7* 09/18/2014 0945   Lipase     Component Value Date/Time   LIPASE 33 09/14/2014 1858       Studies/Results: No results found.  Anti-infectives: Anti-infectives    Start     Dose/Rate Route Frequency Ordered Stop   09/16/14 1800  valACYclovir (VALTREX) tablet 500 mg     500 mg Oral Every T-Th-Sa (1800) 09/15/14 0245         Assessment/Plan  1. Ileus, resolved 2. Hemorrhoid  Plan: 1. anusol for hemorrhoid 2. Advance diet as tolerates. 3. No surgical indications.  We will sign off.   LOS: 5 days    Reena Borromeo E 09/19/2014, 10:38 AM Pager: [phone]

## 2014-09-19 NOTE — Progress Notes (Signed)
PROGRESS NOTE    [name] ION:[id number] DOB: Oct 22, 1941 DOA: 09/14/2014 PCP: [name], MD  HPI/Brief narrative 73 year old female patient with history of ESRD on TTS dialysis, HTN, CAD, lymphoma, A. Fib on amiodarone but not anticoagulation candidate, anxiety, recent admission for hypertensive emergency, readmitted with complaints of nausea, vomiting and abdominal pain. She also has generalized weakness. Last BM/flatus 48 hours ago. She tried stool softeners without any significant benefit. She makes small amount of urine,and denies dysuria. In the ED, imaging was suggestive of SBO versus ileus.   Assessment/Plan:  1. Abdominal pain secondary to SBO Vs Ileus & Fecal impaction: Surgery consultation and follow-up appreciated. Ileus and fecal impaction seem to have resolved. Continue bowel regimen. 2. Hemorrhoids: Anusol suppositories. 3. ESRD/mild hyperkalemia: Nephrology consulted and patient underwent dialysis 11/18 & 11/19. Management per nephrology. Hyperkalemia resolved.  4. Essential hypertension: Recently admitted for hypertensive emergency. Blood pressures have improved postdialysis. Metoprolol which had been discontinued secondary to bradycardia has been resumed due to A. fib with RVR. Continue when necessary hydralazine. 5. Mobitz type I second-degree AV block and bradycardia: Patient was in Mobitz type I second-degree AV block and sinus bradycardia in the 40s on 11/18 a.m. Amiodarone and metoprolol were temporarily held. Resolved. 6. A. fib with RVR: Cardiology following. Back on increased dose amiodarone and metoprolol. Rate control better but cardiology attempting cardioversion with amiodarone. 7. Anxiety: Xanax being held secondary to altered mental status. 8. Thrombocytopenia: Platelet counts lower at 64. Patient has had issues with intermittent low platelets and was 61 on 6/22. Platelet counts better at 103.  9. Failure to thrive: Palliative care team input  appreciated. Patient wishes to continue medical care and DO NOT RESUSCITATE 31. History of non-Hodgkin's lymphoma: As per family, follows with oncology and is said to be in remission. 11. Dyspnea: Resolved. Patient seems to have a congested cough and complains of anterior chest pain with coughing and deep breaths. No documented fever. Blood cultures drawn? Indication. Check chest x-ray to rule out aspiration. 12. Acute encephalopathy: Noted to be somnolent at HD on 11/21 morning. Likely secondary to pain medications in ESRD patient. Mental status improved after holding medications.  Code Status: DO NOT RESUSCITATE  Family Communication: discussed with daughters at bedside on 11/22.  Disposition Plan:  Not medically stable for DC  Consultants:  General surgery  Nephrology  Cardiology  Palliative care team  Procedures:  hemodialysis  Antibiotics:  none   Subjective: Patient complains of nonproductive cough and anterior chest pain with coughing and deep breathing. Denies dyspnea. No reported fevers. Not sure why blood cultures were drawn overnight.  Objective: Filed Vitals:   09/18/14 2316 09/19/14 0542 09/19/14 1008 09/19/14 1417  BP:  110/75 116/83   Pulse: 75 90 83   Temp:  99.2 F (37.3 C) 99 F (37.2 C)   TempSrc:  Axillary Oral   Resp: 18 19 18    Height:      Weight:      SpO2: 99% 100% 98% 97%    Intake/Output Summary (Last 24 hours) at 09/19/14 1457 Last data filed at 09/19/14 0600  Gross per 24 hour  Intake    150 ml  Output      0 ml  Net    150 ml   Filed Weights   09/17/14 2047 09/18/14 0835 09/18/14 2053  Weight: 53.071 kg (117 lb) 52.6 kg (115 lb 15.4 oz) 54.114 kg (119 lb 4.8 oz)     Exam:  General exam: pleasant  elderly frail female, chronically ill-looking, lying comfortably in bed. Looks much improved compared to yesterday. Respiratory system: Slightly diminished breath sounds in the bases but otherwise clear to auscultation. No increased  work of breathing. Wet sounding cough. Cardiovascular system: S1 & S2 heard, irregularly regular and mildly tachycardic. No JVD, murmurs, gallops, clicks or pedal edema. Telemetry: A. fib with RVR in the 100s-110s. Gastrointestinal system: Abdomen is nondistended, soft and nontender. Normal bowel sounds heard. Central nervous system: Lethargic but alert and oriented only to self. No focal neurological deficits. Extremities: Symmetric 5 x 5 power. No involuntary movements noticed during my visit.   Data Reviewed: Basic Metabolic Panel:  Recent Labs Lab 09/14/14 1858 09/15/14 0615 09/16/14 0438 09/16/14 1248 09/18/14 0945  NA 137 137 137  --  141  K 5.1 5.8* 4.9  --  4.9  CL 90* 90* 94*  --  97  CO2 24 24 20   --  23  GLUCOSE 164* 151* 79  --  112*  BUN 60* 67* 35*  --  54*  CREATININE 7.41* 7.78* 5.08*  --  6.36*  CALCIUM 9.4 9.3 8.8  --  8.8  PHOS  --   --   --  4.6 4.5   Liver Function Tests:  Recent Labs Lab 09/14/14 1858 09/15/14 0615 09/18/14 0945  AST 14 13  --   ALT 7 6  --   ALKPHOS 69 65  --   BILITOT 0.4 0.4  --   PROT 6.3 6.0  --   ALBUMIN 3.4* 3.3* 2.7*    Recent Labs Lab 09/14/14 1858  LIPASE 33   No results for input(s): AMMONIA in the last 168 hours. CBC:  Recent Labs Lab 09/14/14 1858 09/15/14 0615 09/16/14 0438 09/17/14 1102 09/18/14 0940  WBC 4.7 7.2 6.8 7.3 7.4  NEUTROABS 2.6  --   --   --   --   HGB 14.1 13.4 12.5 12.5 11.9*  HCT 43.2 40.6 38.9 39.7 37.8  MCV 100.0 98.5 98.2 99.7 98.7  PLT 118* 107* 64* 77* 103*   Cardiac Enzymes:  Recent Labs Lab 09/14/14 1858 09/17/14 1102 09/17/14 1550 09/17/14 2208  TROPONINI <0.30 <0.30 <0.30 <0.30   BNP (last 3 results)  Recent Labs  11/03/13 1655 09/07/14 0400  PROBNP 6465.0* >70000.0*   CBG:  Recent Labs Lab 09/15/14 0817  GLUCAP 165*    Recent Results (from the past 240 hour(s))  Culture, blood (routine x 2)     Status: None (Preliminary result)   Collection Time:  09/18/14 10:40 AM  Result Value Ref Range Status   Specimen Description BLOOD LEFT AVF  Final   Special Requests BOTTLES DRAWN AEROBIC AND ANAEROBIC 10CC  Final   Culture  Setup Time   Final    09/18/2014 18:59 Performed at [facility]    Culture   Final           BLOOD CULTURE RECEIVED NO GROWTH TO DATE CULTURE WILL BE HELD FOR 5 DAYS BEFORE ISSUING A FINAL NEGATIVE REPORT Performed at [facility]    Report Status PENDING  Incomplete  Culture, blood (routine x 2)     Status: None (Preliminary result)   Collection Time: 09/18/14 10:50 AM  Result Value Ref Range Status   Specimen Description BLOOD LEFT AVF  Final   Special Requests BOTTLES DRAWN AEROBIC AND ANAEROBIC 10CC  Final   Culture  Setup Time   Final    09/18/2014 18:59 Performed at [facility]    Culture   Final           BLOOD CULTURE RECEIVED NO GROWTH TO DATE CULTURE WILL BE HELD FOR 5 DAYS BEFORE ISSUING A FINAL NEGATIVE REPORT Performed at [facility]    Report Status PENDING  Incomplete           Studies: No results found.      Scheduled Meds: . amiodarone  200 mg Oral BID  . amLODipine  5 mg Oral Daily  . calcitRIOL  0.5 mcg Oral Q T,Th,[name]  . docusate sodium  100 mg Oral BID  . feeding supplement (NEPRO CARB STEADY)  237 mL Oral BID BM  . ferric gluconate (FERRLECIT/NULECIT) IV  62.5 mg Intravenous Weekly  . fluticasone  2 spray Each Nare BH-q7a  . heparin  5,000 Units Subcutaneous 3 times per day  . hydrocortisone   Rectal BID  . lanthanum  500 mg Oral TID WC  . metoprolol succinate  37.5 mg Oral BID  . pantoprazole  40 mg Oral Daily  . rosuvastatin  10 mg Oral QHS  . valACYclovir  500 mg Oral Q T,Th,Sat-1800   Continuous Infusions:   Principal Problem:   SBO (small bowel obstruction) Active Problems:   Large cell (diffuse) non-Hodgkin's lymphoma   Anemia of renal disease   ESRD on hemodialysis   Paroxysmal atrial fibrillation   CAD (coronary  artery disease)   Palliative care encounter   Weakness generalized   Abdominal pain   Fecal impaction   Ileus   Dyspnea   Acute encephalopathy    Time spent: 25 minutes    Kasper Mudrick, MD, FACP, FHM. [facility] Pager [phone]  If 7PM-7AM, please contact night-coverage [email or web address] Password [email or web address] 09/19/2014, 2:57 PM    LOS: 5 days

## 2014-09-19 NOTE — Evaluation (Signed)
Physical Therapy Evaluation Patient Details Name: [name] MRN: [id number] DOB: 1941-04-11 Today's Date: 09/19/2014   History of Present Illness  The patient was hospitalized and d/c home with husband last Sunday, readmitted with complaints of abdominal pain and generalized weakness. She has been treated for SBO.  Family has been discussing end of life issues and dau feels that she will not be able to return home secondary to need for incr assistance with mobility.  Clinical Impression  Patient is a frail 73 yo married female with decline in mobility. She had recent hospitalization, discharged home with family assist and she sustained further decline in function.  Patient is not able to safely return home at this time secondary to limited physical assistance upon discharge as her husband also has mobility issues.  Patient presents with decr strength, decr sitting balance, inability to walk, and generalized weakness.  She may benefit from skilled therapy to assist with discharge planning, reduce fall risk, improve strength and mobility.    Follow Up Recommendations SNF;Supervision/Assistance - 24 hour    Equipment Recommendations  None recommended by PT    Recommendations for Other Services       Precautions / Restrictions Precautions Precautions: Fall Restrictions Weight Bearing Restrictions: No      Mobility  Bed Mobility Overal bed mobility: Needs Assistance Bed Mobility: Supine to Sit;Sit to Supine     Supine to sit: Mod assist Sit to supine: Max assist      Transfers Overall transfer level: Needs assistance   Transfers: Sit to/from Stand Sit to Stand: Max assist (x 3 trials)         General transfer comment: used standing task to scoot towards [facility]  Ambulation/Gait Ambulation/Gait assistance: +2 physical assistance Ambulation Distance (Feet): 0 Feet (unable to advance either foot) Assistive device: None          Stairs            Wheelchair  Mobility    Modified Rankin (Stroke Patients Only)       Balance Overall balance assessment: Needs assistance Sitting-balance support: Feet supported;Bilateral upper extremity supported Sitting balance-Leahy Scale: Zero Sitting balance - Comments: leans to the right Postural control: Right lateral lean Standing balance support: Bilateral upper extremity supported Standing balance-Leahy Scale: Zero                               Pertinent Vitals/Pain Pain Assessment: No/denies pain    Home Living Family/patient expects to be discharged to:: Private residence Living Arrangements: Spouse/significant other Available Help at Discharge: Family;Available 24 hours/day Type of Home: House Home Access: Stairs to enter Entrance Stairs-Rails: None Entrance Stairs-Number of Steps: 1 Home Layout: One level Home Equipment: Walker - 4 wheels;Wheelchair - [employer];Shower seat      Prior Function Level of Independence: Needs assistance   Gait / Transfers Assistance Needed: supervision with RW  ADL's / Homemaking Assistance Needed: min assistance        Hand Dominance   Dominant Hand: Right    Extremity/Trunk Assessment   Upper Extremity Assessment: Generalized weakness (needs AAROM bil UEs for flexion and abduction)           Lower Extremity Assessment: Generalized weakness      Cervical / Trunk Assessment: Kyphotic  Communication   Communication: No difficulties  Cognition Arousal/Alertness: Lethargic;Suspect due to medications Behavior During Therapy: [facility] for tasks assessed/performed Overall Cognitive Status: Within Functional Limits for tasks  assessed                      General Comments      Exercises General Exercises - Lower Extremity Ankle Circles/Pumps: Both;5 reps;Seated;AROM;Limitations Long Arc Quad: Both;5 reps;Seated;AROM;Limitations Hip Flexion/Marching: AROM;Both;5 reps;Seated;Limitations       Assessment/Plan    PT Assessment Patient needs continued PT services  PT Diagnosis Generalized weakness;Difficulty walking   PT Problem List Decreased strength;Decreased activity tolerance;Decreased balance;Decreased mobility;Decreased knowledge of use of DME;Decreased safety awareness  PT Treatment Interventions DME instruction;Gait training;Functional mobility training;Therapeutic activities;Therapeutic exercise;Balance training;Patient/family education   PT Goals (Current goals can be found in the Care Plan section) Acute Rehab PT Goals Patient Stated Goal: get stronger PT Goal Formulation: With patient/family Time For Goal Achievement: 09/26/14 Potential to Achieve Goals: Good    Frequency Min 3X/week   Barriers to discharge Decreased caregiver support (husband utilizes cane for mobiilty, limited physical assist)      Co-evaluation               End of Session Equipment Utilized During Treatment: Oxygen Activity Tolerance: Patient limited by fatigue;Patient limited by lethargy Patient left: in bed;with call bell/phone within reach;with family/visitor present Nurse Communication: Mobility status (patient pocketed 2 pills, needed applesauce to clear)         Time: 1610-9604 PT Time Calculation (min) (ACUTE ONLY): 35 min   Charges:   PT Evaluation $Initial PT Evaluation Tier I: 1 Procedure PT Treatments $Therapeutic Activity: 8-22 mins   PT G Codes:          Eiden Bagot 09/26/14, 2:12 PM  Malka So, [place]

## 2014-09-19 NOTE — Plan of Care (Signed)
Problem: Phase II Progression Outcomes Goal: Pain controlled Outcome: Completed/Met Date Met:  09/19/14 Goal: Vital signs stable Outcome: Completed/Met Date Met:  09/19/14 Goal: Foley discontinued Outcome: Not Applicable Date Met:  35/57/32

## 2014-09-19 NOTE — Plan of Care (Signed)
Problem: Phase I Progression Outcomes Goal: OOB as tolerated unless otherwise ordered Outcome: Completed/Met Date Met:  09/19/14 Goal: Voiding-avoid urinary catheter unless indicated Outcome: Completed/Met Date Met:  09/19/14 Goal: Vital signs/hemodynamically stable Outcome: Completed/Met Date Met:  09/19/14

## 2014-09-19 NOTE — Progress Notes (Signed)
Consulting cardiologist: [name] MD Primary Cardiologist: Charletta Cousin, MD     Cardiology Specific Problem List: 1. Afib with RVR 2.  Chronic Diastolic CHF 3. Hx of Second degree Heart Block secondary to medications  Subjective:    Denies chest pain or dyspnea. Still slightly lethargic but responds to verbal stimuli. Coughing.   Objective:   Temp:  [98.1 F (36.7 C)-99.6 F (37.6 C)] 99.2 F (37.3 C) (11/22 0542) Pulse Rate:  [73-148] 90 (11/22 0542) Resp:  [12-22] 19 (11/22 0542) BP: (110-165)/(67-114) 110/75 mmHg (11/22 0542) SpO2:  [97 %-100 %] 100 % (11/22 0542) Weight:  [115 lb 15.4 oz (52.6 kg)-119 lb 4.8 oz (54.114 kg)] 119 lb 4.8 oz (54.114 kg) (11/21 2053) Last BM Date: 09/18/14  Filed Weights   09/17/14 2047 09/18/14 0835 09/18/14 2053  Weight: 117 lb (53.071 kg) 115 lb 15.4 oz (52.6 kg) 119 lb 4.8 oz (54.114 kg)    Intake/Output Summary (Last 24 hours) at 09/19/14 2297 Last data filed at 09/19/14 0600  Gross per 24 hour  Intake    270 ml  Output      0 ml  Net    270 ml    Telemetry: Atrial fibrillation rate still rapid at times.  Occasional 1.5 second pauses..   Exam: Pleasant white female in no acute distress General: No acute distress. Lungs: Lungs relatively clear Cardiac: No elevated JVP or bruits.  Irregular rate and rhythm, no S3 or murmur  Extremities: 1+ pitting edema, distal pulses full.Dialysis catheter in the upper left arm.  Echo 11/04/2013 Left ventricle: The cavity size was normal. Systolic function was normal. The estimated ejection fraction was in the range of 60% to 65%. Wall motion was normal; there were no regional wall motion abnormalities. Doppler parameters are consistent with a reversible restrictive pattern, indicative of decreased left ventricular diastolic compliance and/or increased left atrial pressure (grade 3 diastolic dysfunction). - Mitral valve: Mildly calcified annulus. Mild regurgitation. - Left  atrium: The atrium was moderately dilated. - Right atrium: The atrium was mildly dilated. - Pulmonary arteries: Systolic pressure was mildly increased. PA peak pressure: 71mm Hg (S).  Lab Results:  Basic Metabolic Panel:  Recent Labs Lab 09/15/14 0615 09/16/14 0438 09/18/14 0945  NA 137 137 141  K 5.8* 4.9 4.9  CL 90* 94* 97  CO2 24 20 23   GLUCOSE 151* 79 112*  BUN 67* 35* 54*  CREATININE 7.78* 5.08* 6.36*  CALCIUM 9.3 8.8 8.8    Liver Function Tests:  Recent Labs Lab 09/14/14 1858 09/15/14 0615 09/18/14 0945  AST 14 13  --   ALT 7 6  --   ALKPHOS 69 65  --   BILITOT 0.4 0.4  --   PROT 6.3 6.0  --   ALBUMIN 3.4* 3.3* 2.7*    CBC:  Recent Labs Lab 09/16/14 0438 09/17/14 1102 09/18/14 0940  WBC 6.8 7.3 7.4  HGB 12.5 12.5 11.9*  HCT 38.9 39.7 37.8  MCV 98.2 99.7 98.7  PLT 64* 77* 103*    Cardiac Enzymes:  Recent Labs Lab 09/17/14 1102 09/17/14 1550 09/17/14 2208  TROPONINI <0.30 <0.30 <0.30    BNP:  Recent Labs  11/03/13 1655 09/07/14 0400  PROBNP 6465.0* >70000.0*    Coagulation:  Recent Labs Lab 09/15/14 0615  INR 1.37     Medications:   Scheduled Medications: . amiodarone  200 mg Oral BID  . amLODipine  5 mg Oral Daily  . calcitRIOL  0.5 mcg Oral  Q T,Th,[name]  . docusate sodium  100 mg Oral BID  . feeding supplement (NEPRO CARB STEADY)  237 mL Oral BID BM  . ferric gluconate (FERRLECIT/NULECIT) IV  62.5 mg Intravenous Weekly  . fluticasone  2 spray Each Nare BH-q7a  . heparin  5,000 Units Subcutaneous 3 times per day  . lanthanum  500 mg Oral TID WC  . metoprolol succinate  25 mg Oral BID  . pantoprazole  40 mg Oral Daily  . rosuvastatin  10 mg Oral QHS  . valACYclovir  500 mg Oral Q T,Th,Sat-1800    Infusions:    PRN Medications: acetaminophen **OR** acetaminophen, alum & mag hydroxide-simeth, hydrALAZINE, levalbuterol, metoprolol, ondansetron (ZOFRAN) IV, sodium chloride   Assessment and Plan:   1. Atrial  fib with RVR: HR 100-110 bpm, on amiiodarone 200 mg BID, Metroprolol 25 mg XL BID , Heparin SQ. Continue current rate control.  Increase amiodarone to see if can get back and rhythm 2. Chronic Diastolic CHF: No significant edema in LE. 3 liters removed yesterday with dialysis.. She has a lot of upper airway congestion. Concerns for aspiration? Lung sounds are diminished in the bases. Would repeat CXR.   Phill Myron. Lawrence NP [place]  09/19/2014, 8:28 AM   He is currently awake and alert and oriented.  Still mild shortness of breath.  Atrial fibrillation rate is better today but still out of rhythm.  Amiodarone restarted yesterday.  No tremor.  Recommend increasing amiodarone to see if can get back and rhythm.  Kerry Hough MD [facility]

## 2014-09-19 NOTE — Progress Notes (Signed)
Subjective:   Tired, no complaints  Objective Filed Vitals:   09/18/14 1826 09/18/14 2053 09/18/14 2316 09/19/14 0542  BP: 165/114 152/67  110/75  Pulse: 73 83 75 90  Temp: 99.2 F (37.3 C) 99.6 F (37.6 C)  99.2 F (37.3 C)  TempSrc: Oral Oral  Axillary  Resp: 19 18 18 19   Height:      Weight:  54.114 kg (119 lb 4.8 oz)    SpO2: 98%  99% 100%   Physical Exam General: lethargic. Opens eyes, minimal verbal response.  Heart: irreg, tachy.  Lungs: CTA, unlabored. cough Abdomen: soft, nontender  Extremities: trace LE edema  Dialysis Access: L AVF +b/t   Dialysis Orders: TTS @ [facility] 54kgs 2K/2Ca 4 hr 3500 heparin L AVF 400/800 Calcitriol 0.50 mcg Aranesp 200 q week Venofer 50 q week  Assessment/Plan: 1. Altered mental status- Improved today. lethargic but alert and orientied  2. Abdominal pain / fecal impaction -  improved. Full liguid diet. Surgery following 3. ESRD - TTS @ [place]. HD Monday d/t holiday schedule  4. Hypertension/volume - 110/75-on norvasc. at edw 5. Afib / RVR - metop and amiodarone restarted.   6. Anemia - hgb 11.9 hold aranesp. Cont weekly Fe. Watch CBC- baseline hgb outpt 8-9s 7. Metabolic bone disease - NG+2.9. Phos 4.5 Not tolerating renvela,- changed to fosrenol. Cont calcitriol. 8. Nutrition - Alb 2.7 Full liquid liquid. nepro 9. Goals of care - DNR. continue to assess pts desire to cont HD  Shelle Iron, NP [place] [phone] 09/19/2014,9:06 AM  LOS: 5 days   Pt seen, examined and agree w A/P as above. Better off of sedating meds and narcotics.  Plan HD tomorrow on holiday schedule. Advancing diet.  Kelly Splinter MD pager [phone]    cell [phone] 09/19/2014, 12:54 PM    Additional Objective Labs: Basic Metabolic Panel:  Recent Labs Lab 09/15/14 0615 09/16/14 0438 09/16/14 1248 09/18/14 0945  NA 137 137  --  141  K 5.8* 4.9  --  4.9  CL 90* 94*  --  97  CO2 24 20  --  23   GLUCOSE 151* 79  --  112*  BUN 67* 35*  --  54*  CREATININE 7.78* 5.08*  --  6.36*  CALCIUM 9.3 8.8  --  8.8  PHOS  --   --  4.6 4.5   Liver Function Tests:  Recent Labs Lab 09/14/14 1858 09/15/14 0615 09/18/14 0945  AST 14 13  --   ALT 7 6  --   ALKPHOS 69 65  --   BILITOT 0.4 0.4  --   PROT 6.3 6.0  --   ALBUMIN 3.4* 3.3* 2.7*    Recent Labs Lab 09/14/14 1858  LIPASE 33   CBC:  Recent Labs Lab 09/14/14 1858 09/15/14 0615 09/16/14 0438 09/17/14 1102 09/18/14 0940  WBC 4.7 7.2 6.8 7.3 7.4  NEUTROABS 2.6  --   --   --   --   HGB 14.1 13.4 12.5 12.5 11.9*  HCT 43.2 40.6 38.9 39.7 37.8  MCV 100.0 98.5 98.2 99.7 98.7  PLT 118* 107* 64* 77* 103*   Blood Culture    Component Value Date/Time   SDES BLOOD RIGHT CHEST PORTA CATH 07/17/2014 0500   SPECREQUEST  07/17/2014 0500    BOTTLES DRAWN AEROBIC AND ANAEROBIC 8CC BLUE,5CC RED   CULT  07/17/2014 0500    NO GROWTH 5 DAYS Performed at [facility] 07/23/2014  FINAL 07/17/2014 0500    Cardiac Enzymes:  Recent Labs Lab 09/14/14 1858 09/17/14 1102 09/17/14 1550 09/17/14 2208  TROPONINI <0.30 <0.30 <0.30 <0.30   CBG:  Recent Labs Lab 09/15/14 0817  GLUCAP 165*   Iron Studies: No results for input(s): IRON, TIBC, TRANSFERRIN, FERRITIN in the last 72 hours. @lablastinr3 @ Studies/Results: Dg Chest Port 1 View  09/17/2014   CLINICAL DATA:  73 year old female with a history of dyspnea  EXAM: PORTABLE CHEST - 1 VIEW  COMPARISON:  09/14/2014, 09/07/2014, 07/07/2014  FINDINGS: Cardiomediastinal silhouette unchanged in size and contour. Atherosclerotic calcifications of the aortic arch.  No confluent airspace disease. No visualized pneumothorax or pleural effusion.  Right hemidiaphragm elevated compared to the left.  Coarsened interstitial markings.  Single-lumen port catheter on the right chest wall with Huber needle in place. Catheter traverses a right IJ approach and terminates in the  region of the upper right atrium.  No displaced fracture.  Unremarkable appearance of the upper abdomen.  IMPRESSION: No radiographic evidence of acute cardiopulmonary disease.  Atherosclerosis.  Elevation of the right hemidiaphragm, potentially positional.  Single-lumen port catheter on the right chest wall.  Signed,  Dulcy Fanny. Earleen Newport, DO  [facility]  [facility]   Electronically Signed   By: Corrie Mckusick D.O.   On: 09/17/2014 10:00   Medications:   . amiodarone  200 mg Oral BID  . amLODipine  5 mg Oral Daily  . calcitRIOL  0.5 mcg Oral Q T,Th,[name]  . docusate sodium  100 mg Oral BID  . feeding supplement (NEPRO CARB STEADY)  237 mL Oral BID BM  . ferric gluconate (FERRLECIT/NULECIT) IV  62.5 mg Intravenous Weekly  . fluticasone  2 spray Each Nare BH-q7a  . heparin  5,000 Units Subcutaneous 3 times per day  . lanthanum  500 mg Oral TID WC  . metoprolol succinate  37.5 mg Oral BID  . pantoprazole  40 mg Oral Daily  . rosuvastatin  10 mg Oral QHS  . valACYclovir  500 mg Oral Q T,Th,Sat-1800

## 2014-09-20 LAB — BASIC METABOLIC PANEL
ANION GAP: 19 — AB (ref 5–15)
BUN: 53 mg/dL — AB (ref 6–23)
CHLORIDE: 96 meq/L (ref 96–112)
CO2: 24 mEq/L (ref 19–32)
Calcium: 8.5 mg/dL (ref 8.4–10.5)
Creatinine, Ser: 5.93 mg/dL — ABNORMAL HIGH (ref 0.50–1.10)
GFR, EST AFRICAN AMERICAN: 7 mL/min — AB (ref >90)
GFR, EST NON AFRICAN AMERICAN: 6 mL/min — AB (ref >90)
Glucose, Bld: 108 mg/dL — ABNORMAL HIGH (ref 70–99)
POTASSIUM: 4.6 meq/L (ref 3.7–5.3)
SODIUM: 139 meq/L (ref 137–147)

## 2014-09-20 LAB — CBC
HCT: 35.4 % — ABNORMAL LOW (ref 36.0–46.0)
Hemoglobin: 11.4 g/dL — ABNORMAL LOW (ref 12.0–15.0)
MCH: 31.6 pg (ref 26.0–34.0)
MCHC: 32.2 g/dL (ref 30.0–36.0)
MCV: 98.1 fL (ref 78.0–100.0)
PLATELETS: 98 10*3/uL — AB (ref 150–400)
RBC: 3.61 MIL/uL — AB (ref 3.87–5.11)
RDW: 17.4 % — ABNORMAL HIGH (ref 11.5–15.5)
WBC: 6.3 10*3/uL (ref 4.0–10.5)

## 2014-09-20 MED ORDER — LIDOCAINE-PRILOCAINE 2.5-2.5 % EX CREA: 1.0000 "application " | TOPICAL_CREAM | CUTANEOUS | Status: DC | PRN

## 2014-09-20 MED ORDER — SODIUM CHLORIDE 0.9 % IV SOLN: 100.0000 mL | INTRAVENOUS | Status: DC | PRN

## 2014-09-20 MED ORDER — HEPARIN SODIUM (PORCINE) 1000 UNIT/ML DIALYSIS
1000.0000 [IU] | INTRAMUSCULAR | Status: DC | PRN
  Filled 2014-09-20: qty 1

## 2014-09-20 MED ORDER — HEPARIN SODIUM (PORCINE) 1000 UNIT/ML DIALYSIS
3500.0000 [IU] | Freq: Once | INTRAMUSCULAR | Status: AC
  Administered 2014-09-20: 3500 [IU] via INTRAVENOUS_CENTRAL
  Filled 2014-09-20: qty 4

## 2014-09-20 MED ORDER — PENTAFLUOROPROP-TETRAFLUOROETH EX AERO: 1.0000 "application " | INHALATION_SPRAY | CUTANEOUS | Status: DC | PRN

## 2014-09-20 MED ORDER — HEPARIN SODIUM (PORCINE) 1000 UNIT/ML DIALYSIS: 3500.0000 [IU] | Freq: Once | INTRAMUSCULAR | Status: DC

## 2014-09-20 MED ORDER — LIDOCAINE HCL (PF) 1 % IJ SOLN: 5.0000 mL | INTRAMUSCULAR | Status: DC | PRN

## 2014-09-20 MED ORDER — NEPRO/CARBSTEADY PO LIQD: 237.0000 mL | ORAL | Status: DC | PRN

## 2014-09-20 MED ORDER — ALTEPLASE 2 MG IJ SOLR: 2.0000 mg | Freq: Once | INTRAMUSCULAR | Status: DC | PRN

## 2014-09-20 MED ORDER — NEPRO/CARBSTEADY PO LIQD
237.0000 mL | ORAL | Status: DC | PRN
  Administered 2014-09-20: 237 mL via ORAL

## 2014-09-20 MED ORDER — HEPARIN SODIUM (PORCINE) 1000 UNIT/ML DIALYSIS: 1000.0000 [IU] | INTRAMUSCULAR | Status: DC | PRN

## 2014-09-20 MED ORDER — ALTEPLASE 2 MG IJ SOLR
2.0000 mg | Freq: Once | INTRAMUSCULAR | Status: DC | PRN
  Filled 2014-09-20: qty 2

## 2014-09-20 NOTE — Progress Notes (Addendum)
Subjective:    Seen in dialysis Only complaint is that she's sleepy Said she ate and tolerated eggs for breakfast  Objective Filed Vitals:   09/19/14 2046 09/20/14 0506 09/20/14 0806 09/20/14 0817  BP: 152/73 156/70 141/89 151/81  Pulse: 82 84 72 78  Temp: 98.7 F (37.1 C) 99 F (37.2 C) 98.6 F (37 C)   TempSrc: Oral Oral Oral   Resp: 18 18 23 25   Height:      Weight: 53.8 kg (118 lb 9.7 oz)     SpO2: 95% 95% 94% 96%   Physical Exam General: Awakens to have conversation but is quite sleepy.  Seems appropriate. Heart: irregularly irregular Rate in the 80's.  Lungs: Anteriorly clear Abdomen: soft, nontender +BS Extremities: trace LE edema  Mepilex dressings each ankle - not removed Dialysis Access: L AVF +b/t - currently cannulated and running at 350 on HD  Additional Objective Labs: Basic Metabolic Panel:  Recent Labs Lab 09/15/14 0615 09/16/14 0438 09/16/14 1248 09/18/14 0945  NA 137 137  --  141  K 5.8* 4.9  --  4.9  CL 90* 94*  --  97  CO2 24 20  --  23  GLUCOSE 151* 79  --  112*  BUN 67* 35*  --  54*  CREATININE 7.78* 5.08*  --  6.36*  CALCIUM 9.3 8.8  --  8.8  PHOS  --   --  4.6 4.5   Liver Function Tests:  Recent Labs Lab 09/14/14 1858 09/15/14 0615 09/18/14 0945  AST 14 13  --   ALT 7 6  --   ALKPHOS 69 65  --   BILITOT 0.4 0.4  --   PROT 6.3 6.0  --   ALBUMIN 3.4* 3.3* 2.7*    Recent Labs Lab 09/14/14 1858  LIPASE 33   CBC:  Recent Labs Lab 09/14/14 1858 09/15/14 0615 09/16/14 0438 09/17/14 1102 09/18/14 0940  WBC 4.7 7.2 6.8 7.3 7.4  NEUTROABS 2.6  --   --   --   --   HGB 14.1 13.4 12.5 12.5 11.9*  HCT 43.2 40.6 38.9 39.7 37.8  MCV 100.0 98.5 98.2 99.7 98.7  PLT 118* 107* 64* 77* 103*   Blood Culture    Component Value Date/Time   SDES BLOOD LEFT AVF 09/18/2014 1050   SPECREQUEST BOTTLES DRAWN AEROBIC AND ANAEROBIC 10CC 09/18/2014 1050   CULT  09/18/2014 1050           BLOOD CULTURE RECEIVED NO GROWTH TO DATE CULTURE  WILL BE HELD FOR 5 DAYS BEFORE ISSUING A FINAL NEGATIVE REPORT Performed at [facility] PENDING 09/18/2014 1050   Recent Labs Lab 09/14/14 1858 09/17/14 1102 09/17/14 1550 09/17/14 2208  TROPONINI <0.30 <0.30 <0.30 <0.30   CBG:  Recent Labs Lab 09/15/14 0817  GLUCAP 165*   Studies/Results: Dg Chest Port 1 View  09/19/2014   CLINICAL DATA:  73 year old female with productive cough beginning last night, now with shortness of Breath. Initial encounter. Current history of lymphoma.  EXAM: PORTABLE CHEST - 1 VIEW  COMPARISON:  08/2014 and earlier.  FINDINGS: Portable AP semi upright view at 1506 hrs. Right chest porta cath is stable and currently accessed. Stable cardiac size and mediastinal contours. No pneumothorax or pulmonary edema. No pleural effusion or consolidation. Mild patchy opacity at both lung base. Retained oral contrast in normal appearing colon.  IMPRESSION: Mild patchy opacity at both medial lung bases. Could be atelectasis but lung  base infection not excluded. Upright PA and lateral views of the chest would be valuable when possible.   Electronically Signed   By: Lars Pinks M.D.   On: 09/19/2014 15:21   Medications:   . amiodarone  200 mg Oral BID  . amLODipine  5 mg Oral Daily  . calcitRIOL  0.5 mcg Oral Q T,Th,[name]  . docusate sodium  100 mg Oral BID  . feeding supplement (NEPRO CARB STEADY)  237 mL Oral BID BM  . ferric gluconate (FERRLECIT/NULECIT) IV  62.5 mg Intravenous Weekly  . fluticasone  2 spray Each Nare BH-q7a  . heparin  3,500 Units Dialysis Once in dialysis  . heparin  5,000 Units Subcutaneous 3 times per day  . hydrocortisone   Rectal BID  . lanthanum  500 mg Oral TID WC  . metoprolol succinate  37.5 mg Oral BID  . pantoprazole  40 mg Oral Daily  . rosuvastatin  10 mg Oral QHS  . valACYclovir  500 mg Oral Q T,Th,Sat-1800     Dialysis Orders: TTS @ [facility] 54kgs 2K/2Ca 4 hr 3500 heparin L AVF  400/800 Calcitriol 0.50 mcg Aranesp 200 q week Venofer 50 q week  Assessment/Plan: 1. Altered mental status- Improved today. lethargic but alert and orientied  2. Abdominal pain / ileus + fecal impaction -  improved. Tolerating soft diet without pain or nausea 3. ESRD - TTS @ [place]. HD today (Monday)  d/t holiday schedule ([facility]) - next HD will be Wed 4. Hypertension/volume - 110/75-on norvasc. at edw 5. 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 Temp hold on BB and amio - now restarted 6. Afib / RVR - metop and amiodarone restarted. Currently rate controlled 7. Anemia - hgb 11.9 holding aranesp. Cont weekly Fe. Watch CBC- baseline hgb outpt 8-9s. Lab pending from today preHD 8. Metabolic bone disease - IZ+1.2. Phos 4.5 Not tolerating renvela,- changed to fosrenol. Cont calcitriol. 9. Nutrition - Alb 2.7 Full liquid liquid. nepro 10. Non-Hodgkin's lymphoma - in remission 11. Thrombocytopenia - last plt count up to 103K 12. Goals of care - DNR. continue to assess pts desire to cont HD 13. Disposition - Spoke with Dr. Algis Liming - SNF is being pursued for her as current discharge plan  Jamal Maes, MD [place] [phone] Pager 09/20/2014, 8:36 AM

## 2014-09-20 NOTE — Plan of Care (Signed)
Problem: Phase II Progression Outcomes Goal: Tolerating diet Outcome: Progressing     

## 2014-09-20 NOTE — Plan of Care (Signed)
Problem: Phase I Progression Outcomes Goal: Pain controlled with appropriate interventions Outcome: Completed/Met Date Met:  09/20/14     

## 2014-09-20 NOTE — Progress Notes (Signed)
PROGRESS NOTE    [name] BJS:[id number] DOB: 08/08/1941 DOA: 09/14/2014 PCP: [name], MD  HPI/Brief narrative 73 year old female patient with history of ESRD on TTS dialysis, HTN, CAD, lymphoma, A. Fib on amiodarone but not anticoagulation candidate, anxiety, recent admission for hypertensive emergency, readmitted with complaints of nausea, vomiting and abdominal pain. She also has generalized weakness. Last BM/flatus 48 hours ago. She tried stool softeners without any significant benefit. She makes small amount of urine,and denies dysuria. In the ED, imaging was suggestive of SBO versus ileus.   Assessment/Plan:  1. Abdominal pain secondary to SBO Vs Ileus & Fecal impaction: Surgery consultation and follow-up appreciated. Ileus and fecal impaction have resolved. Continue bowel regimen. 2. Hemorrhoids: Anusol suppositories. 3. ESRD/mild hyperkalemia: Nephrology consulted and patient on HD. Management per nephrology. Hyperkalemia resolved.  4. Essential hypertension: Recently admitted for hypertensive emergency. Blood pressures have improved postdialysis. Metoprolol which had been discontinued secondary to bradycardia has been resumed due to A. fib with RVR. Continue when necessary hydralazine. 5. Mobitz type I second-degree AV block and bradycardia: Patient was in Mobitz type I second-degree AV block and sinus bradycardia in the 40s on 11/18 a.m. Amiodarone and metoprolol were temporarily held. Resolved. 6. A. fib with RVR: Cardiology following. Back on increased dose amiodarone and metoprolol. Rate control better but cardiology hoping cardioversion with amiodarone. 7. Anxiety: Xanax being held secondary to altered mental status. 8. Thrombocytopenia: Platelet counts lower at 64. Patient has had issues with intermittent low platelets and was 61 on 6/22. Platelet counts fluctuating but stable. 9. Failure to thrive: Palliative care team input appreciated. Patient wishes to  continue medical care and DO NOT RESUSCITATE 73. History of non-Hodgkin's lymphoma: As per family, follows with oncology and is said to be in remission. 11. Dyspnea: Resolved. No further dyspnea, chest pain and has not had high fevers. Cough mostly dry. Chest x-ray: Likely atelectasis. No further workup or treatment unless she has worsening cough, fever or leukocytosis. 12. Acute encephalopathy: Noted to be somnolent at HD on 11/21 morning. Likely secondary to pain medications in ESRD patient. Mental status much improved and as per daughter, seems to be slightly somnolent in the early mornings but mental status was back to baseline towards the later part of yesterday. Minimize sedating medications.  Code Status: DO NOT RESUSCITATE  Family Communication: discussed with , son and son-in-law at bedside on 11/23.  Disposition Plan:  Possible DC to SNF 11/24-pending bed availability.  Consultants:  General surgery  Nephrology  Cardiology  Palliative care team  Procedures:  hemodialysis  Antibiotics:  none   Subjective: Family indicates that patient has done much better with significant improvement in mental status. Patient denies chest pain, dyspnea or abdominal pain. Tolerated breakfast this morning. Cough is minimal and dry and has much improved too.  Objective: Filed Vitals:   09/20/14 0930 09/20/14 1000 09/20/14 1030 09/20/14 1100  BP: 166/81 156/94 146/81 153/82  Pulse: 70 67 114 111  Temp:      TempSrc:      Resp: 24  23   Height:      Weight:      SpO2:       temperature 98.34F and oxygen saturation 96%.  Intake/Output Summary (Last 24 hours) at 09/20/14 1140 Last data filed at 09/19/14 2112  Gross per 24 hour  Intake     20 ml  Output      0 ml  Net     20 ml   Danley Danker  Weights   09/18/14 2053 09/19/14 2046 09/20/14 0806  Weight: 54.114 kg (119 lb 4.8 oz) 53.8 kg (118 lb 9.7 oz) 53 kg (116 lb 13.5 oz)     Exam:  General exam: pleasant elderly frail female,  chronically ill-looking, lying comfortably in bed undergoing HD. Respiratory system: Slightly diminished breath sounds in the bases but otherwise clear to auscultation. No increased work of breathing. Mostly intermittent dry cough Cardiovascular system: S1 & S2 heard, irregularly regular and mildly tachycardic. No JVD, murmurs, gallops, clicks or pedal edema. Telemetry: A. fib with heart rate in the 80s during my visit. Gastrointestinal system: Abdomen is nondistended, soft and nontender. Normal bowel sounds heard. Central nervous system: alert and oriented 3. Answers questions appropriately. No focal neurological deficits. Extremities: Symmetric 5 x 5 power. No involuntary movements noticed during my visit.   Data Reviewed: Basic Metabolic Panel:  Recent Labs Lab 09/14/14 1858 09/15/14 0615 09/16/14 0438 09/16/14 1248 09/18/14 0945 09/20/14 0500  NA 137 137 137  --  141 139  K 5.1 5.8* 4.9  --  4.9 4.6  CL 90* 90* 94*  --  97 96  CO2 24 24 20   --  23 24  GLUCOSE 164* 151* 79  --  112* 108*  BUN 60* 67* 35*  --  54* 53*  CREATININE 7.41* 7.78* 5.08*  --  6.36* 5.93*  CALCIUM 9.4 9.3 8.8  --  8.8 8.5  PHOS  --   --   --  4.6 4.5  --    Liver Function Tests:  Recent Labs Lab 09/14/14 1858 09/15/14 0615 09/18/14 0945  AST 14 13  --   ALT 7 6  --   ALKPHOS 69 65  --   BILITOT 0.4 0.4  --   PROT 6.3 6.0  --   ALBUMIN 3.4* 3.3* 2.7*    Recent Labs Lab 09/14/14 1858  LIPASE 33   No results for input(s): AMMONIA in the last 168 hours. CBC:  Recent Labs Lab 09/14/14 1858 09/15/14 0615 09/16/14 0438 09/17/14 1102 09/18/14 0940 09/20/14 0817  WBC 4.7 7.2 6.8 7.3 7.4 6.3  NEUTROABS 2.6  --   --   --   --   --   HGB 14.1 13.4 12.5 12.5 11.9* 11.4*  HCT 43.2 40.6 38.9 39.7 37.8 35.4*  MCV 100.0 98.5 98.2 99.7 98.7 98.1  PLT 118* 107* 64* 77* 103* 98*   Cardiac Enzymes:  Recent Labs Lab 09/14/14 1858 09/17/14 1102 09/17/14 1550 09/17/14 2208  TROPONINI  <0.30 <0.30 <0.30 <0.30   BNP (last 3 results)  Recent Labs  11/03/13 1655 09/07/14 0400  PROBNP 6465.0* >70000.0*   CBG:  Recent Labs Lab 09/15/14 0817  GLUCAP 165*    Recent Results (from the past 240 hour(s))  Culture, blood (routine x 2)     Status: None (Preliminary result)   Collection Time: 09/18/14 10:40 AM  Result Value Ref Range Status   Specimen Description BLOOD LEFT AVF  Final   Special Requests BOTTLES DRAWN AEROBIC AND ANAEROBIC 10CC  Final   Culture  Setup Time   Final    09/18/2014 18:59 Performed at [facility]    Culture   Final           BLOOD CULTURE RECEIVED NO GROWTH TO DATE CULTURE WILL BE HELD FOR 5 DAYS BEFORE ISSUING A FINAL NEGATIVE REPORT Performed at [facility]    Report Status PENDING  Incomplete  Culture, blood (routine x  2)     Status: None (Preliminary result)   Collection Time: 09/18/14 10:50 AM  Result Value Ref Range Status   Specimen Description BLOOD LEFT AVF  Final   Special Requests BOTTLES DRAWN AEROBIC AND ANAEROBIC 10CC  Final   Culture  Setup Time   Final    09/18/2014 18:59 Performed at [facility]    Culture   Final           BLOOD CULTURE RECEIVED NO GROWTH TO DATE CULTURE WILL BE HELD FOR 5 DAYS BEFORE ISSUING A FINAL NEGATIVE REPORT Performed at [facility]    Report Status PENDING  Incomplete           Studies: Dg Chest Port 1 View  09/19/2014   CLINICAL DATA:  73 year old female with productive cough beginning last night, now with shortness of Breath. Initial encounter. Current history of lymphoma.  EXAM: PORTABLE CHEST - 1 VIEW  COMPARISON:  08/2014 and earlier.  FINDINGS: Portable AP semi upright view at 1506 hrs. Right chest porta cath is stable and currently accessed. Stable cardiac size and mediastinal contours. No pneumothorax or pulmonary edema. No pleural effusion or consolidation. Mild patchy opacity at both lung base. Retained oral contrast in normal appearing  colon.  IMPRESSION: Mild patchy opacity at both medial lung bases. Could be atelectasis but lung base infection not excluded. Upright PA and lateral views of the chest would be valuable when possible.   Electronically Signed   By: Lars Pinks M.D.   On: 09/19/2014 15:21        Scheduled Meds: . amiodarone  200 mg Oral BID  . amLODipine  5 mg Oral Daily  . calcitRIOL  0.5 mcg Oral Q T,Th,[name]  . docusate sodium  100 mg Oral BID  . feeding supplement (NEPRO CARB STEADY)  237 mL Oral BID BM  . ferric gluconate (FERRLECIT/NULECIT) IV  62.5 mg Intravenous Weekly  . fluticasone  2 spray Each Nare [id number]  . heparin  5,000 Units Subcutaneous 3 times per day  . hydrocortisone   Rectal BID  . lanthanum  500 mg Oral TID WC  . metoprolol succinate  37.5 mg Oral BID  . pantoprazole  40 mg Oral Daily  . rosuvastatin  10 mg Oral QHS  . valACYclovir  500 mg Oral Q T,Th,Sat-1800   Continuous Infusions:   Principal Problem:   SBO (small bowel obstruction) Active Problems:   Large cell (diffuse) non-Hodgkin's lymphoma   Anemia of renal disease   ESRD on hemodialysis   Paroxysmal atrial fibrillation   CAD (coronary artery disease)   Palliative care encounter   Weakness generalized   Abdominal pain   Fecal impaction   Ileus   Dyspnea   Acute encephalopathy    Time spent: 25 minutes    Samreen Seltzer, MD, FACP, FHM. [facility] Pager [phone]  If 7PM-7AM, please contact night-coverage [email or web address] Password [email or web address] 09/20/2014, 11:40 AM    LOS: 6 days

## 2014-09-20 NOTE — Progress Notes (Signed)
Ms. Comacho is very confused and not happy about anything today. She even says that she felt like "they tried to kill me in dialysis" when I asked how dialysis had gone. I spoke with her daughter, Janace Hoard, over the phone who is overwhelmed and trying to coordinate care for her mother and is frustrated with the lack of support and help from her father. I spoke with Angie about continuing palliative care at [facility] rehab - I doubt she will rehab well - and she agrees this will be helpful. I will continue to follow as long as she is here.   Vinie Sill, NP Palliative Medicine Team Pager # [phone] (M-F 8a-5p) Team Phone # [phone] (Nights/Weekends)

## 2014-09-20 NOTE — Care Management Note (Signed)
CARE MANAGEMENT NOTE 09/20/2014  Patient:  [name]   Account Number:  [id number]  Date Initiated:  09/16/2014  Documentation initiated by:  MAYO,HENRIETTA  Subjective/Objective Assessment:   dx sepsis 2/2 UTI; lives with dtr who has MS, has w/c and hosp bed, active with [facility] for RN, PT, OT, 24 hr caregivers for dtr also provide care for pt     Action/Plan:   Noted plan for pt to d/c to SNF in [place], [place]   Anticipated DC Date:  09/21/2014   Anticipated DC Plan:  [place]  CM consult      Choice offered to / List presented to:             Status of service:  Completed, signed off Medicare Important Message given?  YES (If response is "NO", the following Medicare IM given date fields will be blank) Date Medicare IM given:  09/20/2014 Medicare IM given by:  Shabreka Coulon Date Additional Medicare IM given:   Additional Medicare IM given by:    Discharge Disposition:  [place]  Per UR Regulation:  Reviewed for med. necessity/level of care/duration of stay  If discussed at [place] of Stay Meetings, dates discussed:    Comments:

## 2014-09-20 NOTE — Clinical Social Work Psychosocial (Signed)
Clinical Social Work Department BRIEF PSYCHOSOCIAL ASSESSMENT 09/20/2014  Patient:  [name], [name]     Account Number:  [id number]     Admit date:  09/14/2014  Clinical Social Worker:  Frederico Hamman  Date/Time:  09/20/2014 12:05 PM  Referred by:  Physician  Date Referred:  09/20/2014 Referred for  SNF Placement   Other Referral:   Interview type:  Family Other interview type:    PSYCHOSOCIAL DATA Living Status:  HUSBAND Admitted from facility:   Level of care:   Primary support name:  Lark Langenfeld Primary support relationship to patient:  SPOUSE Degree of support available:   Good support from husband, Jodene Polyak and adult children Soil scientist (main contact-[phone]), Hazel Sams and Kandra Nicolas.    CURRENT CONCERNS Current Concerns  Post-Acute Placement   Other Concerns:    SOCIAL WORK ASSESSMENT / PLAN CSW talked with husband by phone regarding discharge planning and recommendation of ST rehab. Mr. Gagliardo requested that CSW contact his daughter as she takes care of everything.  CSW talked with Ms. Cockman by phone regarding ST rehab and she is in agreement. SNF search process explained and CSW emailed SNF list to her. Ms. Loma Newton informed that per MD, patient ready for d/c today.   Assessment/plan status:  Psychosocial Support/Ongoing Assessment of Needs Other assessment/ plan:   Information/referral to community resources:   SNF list for [facility]    PATIENT'S/FAMILY'S RESPONSE TO PLAN OF CARE: Family agreeable to [facility] rehab. Husband pleasant but advised CSW to contact daughter. Ms. Loma Newton receptive to talking with CSW and agreeable to ST rehab.

## 2014-09-20 NOTE — Progress Notes (Signed)
Subjective:  A fib rate better controlled with increasing the amiodarone.  Sleepy.  No abdominal pain or SOB.   Objective:  Vital Signs in the last 24 hours: BP 132/69 mmHg  Pulse 78  Temp(Src) 98.6 F (37 C) (Oral)  Resp 24  Ht 5\' 2"  (1.575 m)  Wt 53 kg (116 lb 13.5 oz)  BMI 21.37 kg/m2  SpO2 96%  Physical Exam: Elderly WF in NAD Lungs: cleatr Cardiac:  irregular rhythm, normal S1 and S2, no S3 Extremities:  No edema present  Intake/Output from previous day: 11/22 0701 - 11/23 0700 In: 20 [P.O.:20] Out: -  Weight Filed Weights   09/18/14 2053 09/19/14 2046 09/20/14 0806  Weight: 54.114 kg (119 lb 4.8 oz) 53.8 kg (118 lb 9.7 oz) 53 kg (116 lb 13.5 oz)    Lab Results: Basic Metabolic Panel:  Recent Labs  09/18/14 0945 09/20/14 0500  NA 141 139  K 4.9 4.6  CL 97 96  CO2 23 24  GLUCOSE 112* 108*  BUN 54* 53*  CREATININE 6.36* 5.93*    CBC:  Recent Labs  09/18/14 0940 09/20/14 0817  WBC 7.4 6.3  HGB 11.9* 11.4*  HCT 37.8 35.4*  MCV 98.7 98.1  PLT 103* 98*    BNP    Component Value Date/Time   PROBNP >70000.0* 09/07/2014 0400    PROTIME: Lab Results  Component Value Date   INR 1.37 09/15/2014   INR 1.08 08/25/2014   INR 0.89 12/19/2013    Telemetry: Atrial fib with slower but still increased ventricular response   Assessment/Plan:  1.  Second-degree AV block-type I improved with reduction in medications but now in rapid afib 2.  Recurrent atrial fibrillation following reduction in medications rate better controlled today. 3. SBO resolved  Recommendations:  Continue current dose of amiodarone and beta blockers today. Hopefully may convert.  Kerry Hough  MD [facility] Cardiology  09/20/2014, 9:15 AM

## 2014-09-20 NOTE — Progress Notes (Signed)
OT Cancellation Note  Patient Details Name: [name] MRN: [id number] DOB: 10/05/1941   Cancelled Treatment:    Reason Eval/Treat Not Completed: Patient at procedure or test/ unavailable. Pt off floor for HD. OT will follow up as available.   Villa Herb M 09/20/2014, 12:02 PM   Cyndie Chime, OTR/L Occupational Therapist [phone] (pager)

## 2014-09-20 NOTE — Procedures (Signed)
HD today (Monday) on [place] schedule ([facility] for TTS) 2K 2Ca pending labs Keeping volume even Left AVF cannulated with 15's, BFR 350 BP stble, afib in the [place], MD [facility] [phone] Pager 09/20/2014, 8:39 AM

## 2014-09-20 NOTE — Clinical Social Work Placement (Addendum)
Clinical Social Work Department CLINICAL SOCIAL WORK PLACEMENT NOTE 09/20/2014  Patient:  [name], [name]  Account Number:  [id number] [place] date:  09/14/2014  Clinical Social Worker:  Lizmarie Witters Givens, LCSW  Date/time:  09/20/2014 12:00 M  Clinical Social Work is seeking post-discharge placement for this patient at the following level of care:   [place]   (*CSW will update this form in Epic as items are completed)   09/20/2014  Patient/family provided with [place] Department of Clinical Social Work's list of facilities offering this level of care within the geographic area requested by the patient (or if unable, by the patient's family).  09/20/2014  Patient/family informed of their freedom to choose among providers that offer the needed level of care, that participate in Medicare, Medicaid or managed care program needed by the patient, have an available bed and are willing to accept the patient.    Patient/family informed of [facility]' ownership interest in [facility], as well as of the fact that they are under no obligation to receive care at this facility.  PASARR submitted to EDS on 09/20/2014 PASARR number received on   FL2 transmitted to all facilities in geographic area requested by pt/family on  09/20/2014 FL2 transmitted to all facilities within larger geographic area on   Patient informed that his/her managed care company has contracts with or will negotiate with  certain facilities, including the following:     Patient/family informed of bed offers received:  09/20/2014 Patient chooses bed at [facility] Physician recommends and patient chooses bed at    Patient to be transferred to [place] on 11/215  Patient to be transferred to facility by ambulance Patient and family notified of transfer on 09/21/14 Name of family member notified: Davonna Belling [phone])  The following physician  request were entered in Epic:  Additional Comments: 09/20/14: Need FL-2 and 30-Day note signed by MD to [place] number. 09/20/14 - TC with daughter (5:01 pm), Ms. Loma Newton. She is aware of probable Tuesday discharge to SNF.

## 2014-09-21 MED ORDER — WARFARIN SODIUM 2.5 MG PO TABS
2.5000 mg | ORAL_TABLET | Freq: Once | ORAL | Status: DC
  Filled 2014-09-21: qty 1

## 2014-09-21 MED ORDER — BISACODYL 10 MG RE SUPP: 10.0000 mg | Freq: Every day | RECTAL | Status: AC | PRN

## 2014-09-21 MED ORDER — WARFARIN SODIUM 2.5 MG PO TABS: 2.5000 mg | ORAL_TABLET | Freq: Every day | ORAL | Status: AC

## 2014-09-21 MED ORDER — ACETAMINOPHEN 325 MG PO TABS: 650.0000 mg | ORAL_TABLET | Freq: Four times a day (QID) | ORAL | Status: AC | PRN

## 2014-09-21 MED ORDER — HYDROCORTISONE 2.5 % RE CREA: TOPICAL_CREAM | Freq: Two times a day (BID) | RECTAL | Status: AC

## 2014-09-21 MED ORDER — HEPARIN SOD (PORK) LOCK FLUSH 100 UNIT/ML IV SOLN
500.0000 [IU] | INTRAVENOUS | Status: DC | PRN
  Administered 2014-09-21: 500 [IU]
  Filled 2014-09-21: qty 5

## 2014-09-21 MED ORDER — SODIUM CHLORIDE 0.9 % IJ SOLN
10.0000 mL | INTRAMUSCULAR | Status: DC | PRN
  Administered 2014-09-21: 10 mL

## 2014-09-21 MED ORDER — WARFARIN SODIUM 2.5 MG PO TABS
2.5000 mg | ORAL_TABLET | Freq: Once | ORAL | Status: AC
  Administered 2014-09-21: 2.5 mg via ORAL
  Filled 2014-09-21: qty 1

## 2014-09-21 MED ORDER — AMIODARONE HCL 200 MG PO TABS: 200.0000 mg | ORAL_TABLET | Freq: Two times a day (BID) | ORAL | Status: AC

## 2014-09-21 MED ORDER — SENNOSIDES-DOCUSATE SODIUM 8.6-50 MG PO TABS: 2.0000 | ORAL_TABLET | Freq: Every day | ORAL | Status: DC

## 2014-09-21 MED ORDER — LANTHANUM CARBONATE 500 MG PO CHEW: 500.0000 mg | CHEWABLE_TABLET | Freq: Three times a day (TID) | ORAL | Status: AC

## 2014-09-21 MED ORDER — SENNOSIDES-DOCUSATE SODIUM 8.6-50 MG PO TABS: 2.0000 | ORAL_TABLET | Freq: Every day | ORAL | Status: AC

## 2014-09-21 MED ORDER — BISACODYL 10 MG RE SUPP
10.0000 mg | Freq: Every day | RECTAL | Status: DC | PRN
  Administered 2014-09-21: 10 mg via RECTAL
  Filled 2014-09-21: qty 1

## 2014-09-21 MED ORDER — CALCITRIOL 0.5 MCG PO CAPS: 0.5000 ug | ORAL_CAPSULE | ORAL | Status: AC

## 2014-09-21 MED ORDER — METOPROLOL SUCCINATE ER 25 MG PO TB24
37.5000 mg | ORAL_TABLET | Freq: Two times a day (BID) | ORAL | Status: AC
Start: 2014-09-21 — End: ?

## 2014-09-21 MED ORDER — DOCUSATE SODIUM 100 MG PO CAPS: 100.0000 mg | ORAL_CAPSULE | Freq: Two times a day (BID) | ORAL | Status: AC

## 2014-09-21 MED ORDER — HEPARIN SOD (PORK) LOCK FLUSH 100 UNIT/ML IV SOLN
500.0000 [IU] | INTRAVENOUS | Status: DC
  Filled 2014-09-21: qty 5

## 2014-09-21 MED ORDER — NEPRO/CARBSTEADY PO LIQD: 237.0000 mL | Freq: Two times a day (BID) | ORAL | Status: AC

## 2014-09-21 MED ORDER — SODIUM CHLORIDE 0.9 % IJ SOLN: 10.0000 mL | Freq: Two times a day (BID) | INTRAMUSCULAR | Status: DC

## 2014-09-21 MED ORDER — WARFARIN - PHARMACIST DOSING INPATIENT: Freq: Every day | Status: DC

## 2014-09-21 NOTE — Progress Notes (Signed)
Subjective:  She complains of feeling sleepy today, but is awake and alert and not having any tremor.  Atrial fibrillation rate is better controlled.  No shortness of breath or chest pain.  Objective:  Vital Signs in the last 24 hours: BP 134/86 mmHg  Pulse 54  Temp(Src) 99.3 F (37.4 C) (Oral)  Resp 16  Ht 5\' 2"  (1.575 m)  Wt 53.7 kg (118 lb 6.2 oz)  BMI 21.65 kg/m2  SpO2 94%  Physical Exam: Elderly WF in NAD Lungs: cleatr Cardiac:  irregular rhythm, normal S1 and S2, no S3 Extremities:  No edema present  Intake/Output from previous day:   Weight Filed Weights   09/20/14 0806 09/20/14 1225 09/20/14 2054  Weight: 53 kg (116 lb 13.5 oz) 53.1 kg (117 lb 1 oz) 53.7 kg (118 lb 6.2 oz)    Lab Results: Basic Metabolic Panel:  Recent Labs  09/18/14 0945 09/20/14 0500  NA 141 139  K 4.9 4.6  CL 97 96  CO2 23 24  GLUCOSE 112* 108*  BUN 54* 53*  CREATININE 6.36* 5.93*    CBC:  Recent Labs  09/18/14 0940 09/20/14 0817  WBC 7.4 6.3  HGB 11.9* 11.4*  HCT 37.8 35.4*  MCV 98.7 98.1  PLT 103* 98*    BNP    Component Value Date/Time   PROBNP >70000.0* 09/07/2014 0400   Telemetry: Atrial fibrillation currently with controlled ventricular response  Assessment/Plan:  1. Persistent atrial fibrillation-rate is currently controlled.  At this point in time her lymphoma is in remission and there is a question about whether this will remain in remission.  She previously was on anticoagulation with Pradaxa but this was discontinued when she developed renal failure.  She has not been on anticoagulation while she was receiving lymphoma treatment since 2014, but has recurrent atrial fibrillation now.  I would favor reinstitution of anticoagulation to avoid stroke with warfarin and would go ahead and initiate this without bridging.  Dr. Antonieta Pert office could probably follow her warfarin and if they're unable to do so, we would be happy to.  Will need to be careful with the mild  thrombocytopenia that she has.   2.  History of mild heart block when in sinus rhythm but currently rate controlled on current medical regimen.  I will continue her beta blocker at the current dose and reduce her amiodarone to 200 mg twice daily.    3. SBO resolved  Recommendations:  She is evidently being transferred to skilled nursing today.  I would go ahead and institute warfarin therapy and continue amiodarone and beta blocker.  I will see when she gets out of the nursing home.    Kerry Hough  MD [facility] Cardiology  09/21/2014, 8:51 AM

## 2014-09-21 NOTE — Progress Notes (Signed)
Progress Note from the Palliative Medicine Team at [facility]: [name] is much more clear and cooperative today. She is scheduled for SNF rehab today and recommend palliative to follow her there - please place recommendation in discharge summary. She has no complaints. Per nursing she has not had a bowel movement the past few days and last was a smear on Saturday. I will add scheduled senokot along with colace for bowel regimen and dulcolax suppository daily prn. I discussed with [name] and she confirms she wishes to continue care and dialysis but I encouraged her to let us know when this becomes too much or too difficult for her and this is okay to admit and we can help her with this.     Objective: Allergies  Allergen Reactions  . Advicor [Niacin-Lovastatin Er]     Muscle cramps  . Crestor [Rosuvastatin]     Muscle cramps  . Lipitor [Atorvastatin]     Muscle cramps  . Lovastatin     Muscle cramps  . Morphine And Related Other (See Comments)    "made me crazy"  . Niaspan [Niacin Er]     Muscle cramps  . Nsaids     Gi side effects  . Pravachol [Pravastatin Sodium]     Gi side effects  . Sulfa Antibiotics Other (See Comments)    Can't remember   . Vytorin [Ezetimibe-Simvastatin] Nausea Only  . Welchol [Colesevelam Hcl]     Muscle cramps  . Zetia [Ezetimibe]     Muscle cramps   . Zocor [Simvastatin]     Muscle cramps    Scheduled Meds: . amiodarone  200 mg Oral BID  . amLODipine  5 mg Oral Daily  . calcitRIOL  0.5 mcg Oral Q T,Th,[name]  . docusate sodium  100 mg Oral BID  . feeding supplement (NEPRO CARB STEADY)  237 mL Oral BID BM  . ferric gluconate (FERRLECIT/NULECIT) IV  62.5 mg Intravenous Weekly  . fluticasone  2 spray Each Nare [id number]  . heparin  5,000 Units Subcutaneous 3 times per day  . hydrocortisone   Rectal BID  . lanthanum  500 mg Oral TID WC  . metoprolol succinate  37.5 mg Oral BID  . pantoprazole  40 mg Oral Daily  . rosuvastatin   10 mg Oral QHS  . senna-docusate  2 tablet Oral QHS  . valACYclovir  500 mg Oral Q T,Th,Sat-1800   Continuous Infusions:  PRN Meds:.acetaminophen **OR** acetaminophen, alum & mag hydroxide-simeth, bisacodyl, hydrALAZINE, levalbuterol, metoprolol, ondansetron (ZOFRAN) IV, sodium chloride  BP 134/86 mmHg  Pulse 54  Temp(Src) 99.3 F (37.4 C) (Oral)  Resp 16  Ht 5\' 2"  (1.575 m)  Wt 53.7 kg (118 lb 6.2 oz)  BMI 21.65 kg/m2  SpO2 94%   PPS: 30%   Intake/Output Summary (Last 24 hours) at 09/21/14 0918 Last data filed at 09/20/14 1225  Gross per 24 hour  Intake      0 ml  Output      0 ml  Net      0 ml      LBM: 11/21  Physical Exam:  General: NAD, thin, frail, ill-appearing HEENT: Muscle wasting, no JVD, moist mucous membranes Chest: No labored breathing, symmetric, Rt PAC accessed CVS: Irreg irreg Abdomen: Soft, NT, ND Ext: MAE, no edema, warm to touch Neuro: Awake, alert, oriented x 3   Labs: CBC    Component Value Date/Time   WBC 6.3 09/20/2014 0817   WBC 4.2  09/03/2014 1217   RBC 3.61* 09/20/2014 0817   RBC 2.85* 09/03/2014 1217   RBC 2.97* 08/04/2013 0918   HGB 11.4* 09/20/2014 0817   HGB 9.6* 09/03/2014 1217   HCT 35.4* 09/20/2014 0817   HCT 30.7* 09/03/2014 1217   PLT 98* 09/20/2014 0817   PLT 112* 09/03/2014 1217   MCV 98.1 09/20/2014 0817   MCV 108* 09/03/2014 1217   MCH 31.6 09/20/2014 0817   MCH 33.7 09/03/2014 1217   MCHC 32.2 09/20/2014 0817   MCHC 31.3* 09/03/2014 1217   RDW 17.4* 09/20/2014 0817   RDW 15.4 09/03/2014 1217   LYMPHSABS 1.2 09/14/2014 1858   LYMPHSABS 1.3 09/03/2014 1217   MONOABS 0.8 09/14/2014 1858   EOSABS 0.0 09/14/2014 1858   EOSABS 0.0 09/03/2014 1217   BASOSABS 0.0 09/14/2014 1858   BASOSABS 0.0 09/03/2014 1217    BMET    Component Value Date/Time   NA 139 09/20/2014 0500   NA 142 09/03/2014 1217   K 4.6 09/20/2014 0500   K 3.3 09/03/2014 1217   CL 96 09/20/2014 0500   CL 95* 09/03/2014 1217   CO2 24  09/20/2014 0500   CO2 32 09/03/2014 1217   GLUCOSE 108* 09/20/2014 0500   GLUCOSE 129* 09/03/2014 1217   BUN 53* 09/20/2014 0500   BUN 13 09/03/2014 1217   CREATININE 5.93* 09/20/2014 0500   CREATININE 3.6* 09/03/2014 1217   CALCIUM 8.5 09/20/2014 0500   CALCIUM 8.9 09/03/2014 1217   GFRNONAA 6* 09/20/2014 0500   GFRAA 7* 09/20/2014 0500    CMP     Component Value Date/Time   NA 139 09/20/2014 0500   NA 142 09/03/2014 1217   K 4.6 09/20/2014 0500   K 3.3 09/03/2014 1217   CL 96 09/20/2014 0500   CL 95* 09/03/2014 1217   CO2 24 09/20/2014 0500   CO2 32 09/03/2014 1217   GLUCOSE 108* 09/20/2014 0500   GLUCOSE 129* 09/03/2014 1217   BUN 53* 09/20/2014 0500   BUN 13 09/03/2014 1217   CREATININE 5.93* 09/20/2014 0500   CREATININE 3.6* 09/03/2014 1217   CALCIUM 8.5 09/20/2014 0500   CALCIUM 8.9 09/03/2014 1217   PROT 6.0 09/15/2014 0615   PROT 6.3* 09/03/2014 1217   ALBUMIN 2.7* 09/18/2014 0945   AST 13 09/15/2014 0615   AST 18 09/03/2014 1217   ALT 6 09/15/2014 0615   ALT 13 09/03/2014 1217   ALKPHOS 65 09/15/2014 0615   ALKPHOS 54 09/03/2014 1217   BILITOT 0.4 09/15/2014 0615   BILITOT 0.50 09/03/2014 1217   GFRNONAA 6* 09/20/2014 0500   GFRAA 7* 09/20/2014 0500    Assessment and Plan: 1. Code Status: DNR 2. Symptom Control: 1. Pain: Oxycodone 5 mg every 4 hours prn moderate pain. Fentanyl 25 mcg every 3 hours severe pain.  2. Bowel Regimen: Colace daily. Senokot-S 2 tablets daily. Dulcolax supp daily prn.   3. Gas pains: Maalox every 4 hours prn.  4. Weakness: PT consulted. Continue HD and medical management as able.  5. Vertigo: She states that PT has helped with her vertigo in the past.  6. Malnutrition: Encourage frequent small meals. Nepro BID. She says she weighed ~240 lbs but has lost down to ~125 lbs over the past ~5 years. Has very little appetite.  3. Psycho/Social: Emotional support provided to patient.  4. Spiritual: Chaplain consulted for  support.  5. Disposition: SNF rehab with palliative to follow.     Time In Time Out Total Time Spent  with Patient Total Overall Time  0900 0920 61min 15min    Greater than 50%  of this time was spent counseling and coordinating care related to the above assessment and plan.  Vinie Sill, NP Palliative Medicine Team Pager # [phone] (M-F 8a-5p) Team Phone # [phone] (Nights/Weekends)   1

## 2014-09-21 NOTE — Plan of Care (Signed)
Problem: Phase I Progression Outcomes Goal: Dyspnea controlled at rest Outcome: Completed/Met Date Met:  09/21/14

## 2014-09-21 NOTE — Discharge Instructions (Signed)

## 2014-09-21 NOTE — Progress Notes (Signed)
Patient had a large bowel movement.Patient verbalized feeling better. [name], [name], Therapist, sports

## 2014-09-21 NOTE — Plan of Care (Signed)
Problem: Phase III Progression Outcomes Goal: Pain controlled on oral analgesia Outcome: Completed/Met Date Met:  09/21/14

## 2014-09-21 NOTE — Progress Notes (Signed)
Patient still with no bm despite suppository,pt requesting for other medication to help her move her bowel.Dr. Algis Liming notified.No new orders received. Brithany Whitworth, Wonda Cheng, Therapist, sports

## 2014-09-21 NOTE — Plan of Care (Signed)
Problem: Phase I Progression Outcomes Goal: Education officer, museum consult (if SNF or HD pt.) Outcome: Completed/Met Date Met:  09/21/14

## 2014-09-21 NOTE — Plan of Care (Signed)
Problem: Phase I Progression Outcomes Goal: Hemodynamically stable Outcome: Completed/Met Date Met:  09/21/14     

## 2014-09-21 NOTE — Plan of Care (Signed)
Problem: Phase I Progression Outcomes Goal: Initial discharge plan identified Outcome: Completed/Met Date Met:  09/21/14     

## 2014-09-21 NOTE — Plan of Care (Signed)
Problem: Phase II Progression Outcomes Goal: Pain controlled Outcome: Completed/Met Date Met:  09/21/14

## 2014-09-21 NOTE — Plan of Care (Signed)
Problem: Phase III Progression Outcomes Goal: Hemodynamically stable Outcome: Completed/Met Date Met:  09/21/14

## 2014-09-21 NOTE — Discharge Summary (Signed)
Physician Discharge Summary  [name] GMW:[id number] DOB: [birth year] DOA: 09/14/2014  PCP: [name], MD  Admit date: 09/14/2014 Discharge date: 09/21/2014  Time spent: Greater than 30 minutes  Recommendations for Outpatient Follow-up:  1. M.D. at SNF in 3 days. Please follow up final blood culture results that were sent from the hospital. 2. [facility] on Wednesday and Saturday of this week for hemodialysis (holiday schedule). From next week she keeps up her regular schedule of Tuesday/Thursday/Saturday dialysis. 3.  Check daily PT & INR at SNF and adjust Coumadin doses as needed. Labs can be drawn less frequently when INR is therapeutic and stable. 4. Weekly CBC. 5. Dr. Darcus Austin, PCP upon discharge from SNF. 6. Dr. Jacolyn Reedy, Cardiology, upon discharge from SNF. 7. Arrangements to be made by SNF prior to discharge from their facility regarding outpatient follow-up of PT INR with either Dr.Peter Ennever, Oncology or Dr. Grace Bushy. [name], Cardiology. 8. Recommend palliative care follow-up at SNF.  Discharge Diagnoses:  Principal Problem:   ESRD on hemodialysis Active Problems:   Large cell (diffuse) non-Hodgkin's lymphoma   Anemia of renal disease   Paroxysmal atrial fibrillation   CAD (coronary artery disease)   SBO (small bowel obstruction)   Palliative care encounter   Weakness generalized   Abdominal pain   Ileus   Dyspnea   Acute encephalopathy   Discharge Condition: Improved & Stable  Diet recommendation:  heart healthy diet.  Filed Weights   09/20/14 0806 09/20/14 1225 09/20/14 2054  Weight: 53 kg (116 lb 13.5 oz) 53.1 kg (117 lb 1 oz) 53.7 kg (118 lb 6.2 oz)    History of present illness:  73 year old female patient with history of ESRD on TTS dialysis, HTN, CAD, lymphoma, A. Fib on amiodarone but not anticoagulation candidate while on lymphoma Rx, anxiety, recent admission for hypertensive emergency, readmitted  with complaints of nausea, vomiting and abdominal pain. She also has generalized weakness. Last BM/flatus 48 hours PTA. She tried stool softeners without any significant benefit. She makes small amount of urine,and denies dysuria. In the ED, imaging was suggestive of SBO versus ileus.  Hospital Course:     Abdominal pain secondary to SBO Vs Ileus & Fecal impaction: Surgery consultation and follow-up appreciated. Ileus and fecal impaction have resolved. Continue bowel regimen.  Hemorrhoids: Anusol suppositories.  ESRD/mild hyperkalemia: Nephrology consulted and patient continued HD. Management per nephrology. Hyperkalemia resolved. Due to Thanksgiving holidays, patient will have hemodialysis on Wednesday and Saturday of this week and from next week she can resume her regular schedule of Tuesday/Thursday/Saturday. SNF to arrange for transport to dialysis Center.  Essential hypertension: Recently admitted for hypertensive emergency. Blood pressures have improved postdialysis. Metoprolol which had been discontinued secondary to bradycardia has been resumed due to A. fib with RVR.   Mobitz type I second-degree AV block and bradycardia: Patient was in Mobitz type I second-degree AV block and sinus bradycardia in the 40s on 11/18 a.m. Amiodarone and metoprolol were temporarily held. Resolved.  A. fib with RVR: Cardiology consulted and made medication adjustments. This is now rate controlled. As per cardiology, she previously was on anticoagulation with Pradaxa which had to be discontinued when she developed renal failure. She has not been on anticoagulation while she was receiving lymphoma treatment since 2014 but has recurrent atrial fibrillation now. Cardiology recommends resuming Coumadin to avoid stroke. PT and INR can be drawn at SNF and Coumadin dose can be adjusted. Post discharge from SNF, arrangements to  be made to follow-up either with Dr. Marin Olp or Dr. [name] regarding Coumadin management.    Anxiety: Xanax DC'ed secondary to altered mental status.  Thrombocytopenia: Platelet counts fluctuating but stable. FU closely as OP.  Anemia: Stable. Close outpatient follow-up  Failure to thrive: Palliative care team input appreciated. Patient wishes to continue medical care and DO NOT RESUSCITATE  History of non-Hodgkin's lymphoma: As per family, follows with oncology and is said to be in remission.  Dyspnea: Resolved. No further dyspnea, chest pain and has not had high fevers. Cough resolved. Chest x-ray: Likely atelectasis. No further workup or treatment unless she has worsening cough, fever or leukocytosis.  Acute encephalopathy: Noted to be somnolent at HD on 11/21 morning. Likely secondary to pain medications in ESRD patient. Mental status changes resolved. Avoid sedating medications.  DO NOT RESUSCITATE  Consultants:  General surgery  Nephrology  Cardiology  Palliative care team  Procedures:  hemodialysis   Discharge Exam:  Complaints: Patient denies cough, dyspnea or chest pain. Passing flatus. Intermittent mild rectal pain. No abdominal pain, nausea or vomiting reported. Eating small amounts. As per spouse at bedside, she slept all night. As per nursing, no acute events.  Filed Vitals:   09/20/14 1831 09/20/14 2054 09/20/14 2122 09/21/14 0630  BP:  127/64  134/86  Pulse:  96  54  Temp: 99.4 F (37.4 C) 99.9 F (37.7 C) 100 F (37.8 C) 99.3 F (37.4 C)  TempSrc: Oral Oral Oral Oral  Resp:  17  16  Height:  _0  (1.575 m)    Weight:  53.7 kg (118 lb 6.2 oz)    SpO2:  93%  94%   General exam: pleasant elderly frail female, chronically ill-looking, lying comfortably in bed. Respiratory system: Slightly diminished breath sounds in the bases but otherwise clear to auscultation. No increased work of breathing.  Cardiovascular system: S1 & S2 heard, irregularly regular. No JVD, murmurs, gallops, clicks or pedal edema. Telemetry: A. fib with controlled  ventricular rate. Gastrointestinal system: Abdomen is nondistended, soft and nontender. Normal bowel sounds heard. Central nervous system: alert and oriented 3. Answers questions appropriately. No focal neurological deficits. Extremities: Symmetric 5 x 5 power. No involuntary movements noticed during my visit.  Discharge Instructions      Discharge Instructions    Call MD for:  persistant nausea and vomiting    Complete by:  As directed      Call MD for:  severe uncontrolled pain    Complete by:  As directed      Call MD for:    Complete by:  As directed   Constipation/fecal impaction.     Diet - low sodium heart healthy    Complete by:  As directed      Increase activity slowly    Complete by:  As directed             Medication List    STOP taking these medications        ALPRAZolam 0.5 MG tablet  Commonly known as:  XANAX     fentaNYL 12 MCG/HR  Commonly known as:  DURAGESIC - dosed mcg/hr     lactulose 10 GM/15ML solution  Commonly known as:  CHRONULAC     megestrol 400 MG/10ML suspension  Commonly known as:  MEGACE     metoprolol tartrate 25 MG tablet  Commonly known as:  LOPRESSOR     prochlorperazine 10 MG tablet  Commonly known as:  COMPAZINE  sevelamer carbonate 800 MG tablet  Commonly known as:  RENVELA     traMADol 50 MG tablet  Commonly known as:  ULTRAM      TAKE these medications        acetaminophen 325 MG tablet  Commonly known as:  TYLENOL  Take 2 tablets (650 mg total) by mouth every 6 (six) hours as needed for mild pain, moderate pain, fever or headache (or Fever >/= 101).     amiodarone 200 MG tablet  Commonly known as:  PACERONE  Take 1 tablet (200 mg total) by mouth 2 (two) times daily.     amLODipine 5 MG tablet  Commonly known as:  NORVASC  Take 1 tablet (5 mg total) by mouth daily.     bisacodyl 10 MG suppository  Commonly known as:  DULCOLAX  Place 1 suppository (10 mg total) rectally daily as needed for moderate  constipation.     calcitRIOL 0.5 MCG capsule  Commonly known as:  ROCALTROL  Take 1 capsule (0.5 mcg total) by mouth Every Tuesday,Thursday,and Saturday with dialysis.     CRESTOR 10 MG tablet  Generic drug:  rosuvastatin  Take 10 mg by mouth at bedtime.     docusate sodium 100 MG capsule  Commonly known as:  COLACE  Take 1 capsule (100 mg total) by mouth 2 (two) times daily.     esomeprazole 40 MG capsule  Commonly known as:  NEXIUM  Take 40 mg by mouth 2 (two) times daily before a meal.     famciclovir 250 MG tablet  Commonly known as:  FAMVIR  Take 250 mg by mouth 3 (three) times a week. Tuesday, Thursday and Saturday     feeding supplement (NEPRO CARB STEADY) Liqd  Take 237 mLs by mouth 2 (two) times daily between meals.     fluticasone 50 MCG/ACT nasal spray  Commonly known as:  FLONASE  Place 2 sprays into both nostrils every morning.     hydrocortisone 2.5 % rectal cream  Commonly known as:  ANUSOL-HC  Place rectally 2 (two) times daily.     lanthanum 500 MG chewable tablet  Commonly known as:  FOSRENOL  Chew 1 tablet (500 mg total) by mouth 3 (three) times daily with meals.     lidocaine-prilocaine cream  Commonly known as:  EMLA  Apply 1 application topically as needed (for port-a-cath access).     metoprolol succinate 25 MG 24 hr tablet  Commonly known as:  TOPROL-XL  Take 1.5 tablets (37.5 mg total) by mouth 2 (two) times daily.     olopatadine 0.1 % ophthalmic solution  Commonly known as:  PATANOL  Place 1 drop into both eyes every morning.     senna-docusate 8.6-50 MG per tablet  Commonly known as:  Senokot-S  Take 2 tablets by mouth at bedtime.     warfarin 2.5 MG tablet  Commonly known as:  COUMADIN  Take 1 tablet (2.5 mg total) by mouth daily at 6 PM.  Start taking on:  09/22/2014          The results of significant diagnostics from this hospitalization (including imaging, microbiology, ancillary and laboratory) are listed below for  reference.    Significant Diagnostic Studies: Dg Chest 2 View  09/07/2014   CLINICAL DATA:  CHF, post hemodialysis  EXAM: CHEST  2 VIEW  COMPARISON:  Earlier exam of 09/07/2014  FINDINGS: RIGHT jugular Port-A-Cath with tip projecting over SVC near cavoatrial junction.  Enlargement of cardiac silhouette  with pulmonary vascular congestion.  Atherosclerotic calcification aorta.  Atelectasis versus consolidation in LEFT lower lobe.  Minimal atelectasis at RIGHT base.  Lungs otherwise clear.  No gross pleural effusion or pneumothorax.  Skin fold projects over LEFT chest.  IMPRESSION: Enlargement of cardiac silhouette with pulmonary vascular congestion.  Atelectasis versus consolidation in LEFT lower lobe with minimal RIGHT basilar atelectasis.   Electronically Signed   By: Lavonia Dana M.D.   On: 09/07/2014 20:42   Ct Abdomen Pelvis W Contrast  09/15/2014   CLINICAL DATA:  Bilateral lower quadrant abdominal pain  EXAM: CT ABDOMEN AND PELVIS WITH CONTRAST  TECHNIQUE: Multidetector CT imaging of the abdomen and pelvis was performed using the standard protocol following bolus administration of intravenous contrast.  CONTRAST:  129m OMNIPAQUE IOHEXOL 300 MG/ML  SOLN  COMPARISON:  07/17/2014  FINDINGS: Lung bases demonstrate bibasilar atelectatic changes. Very minimal pleural fluid is noted on the right. The gallbladder is well distended and demonstrates enhancing gallbladder polyps. These are better visualized than on the prior exam. Fluid attenuation lesion is again noted within the lateral spleen stable from the prior exam. The right adrenal gland is within normal limits. The left adrenal gland again demonstrates a low-density mass with peripheral calcifications stable from the previous exam. The pancreas is unremarkable.  The kidneys again show changes of renal cystic change. No renal calculi or obstructive changes are noted. Delayed images demonstrate normal excretion of contrast. There are dilated loops of  distal small bowel with fecalization of small bowel contents. No definitive transition zone is identified. Correlation with physical exam is recommended as this may represent ileus. No free air is seen. No free fluid is noted. Diffuse aortoiliac calcifications are noted. Scanning into the pelvis reveals the bladder to be well distended. No pelvic mass lesion is noted. Uterus has been surgically removed. The appendix has also been removed. No acute bony abnormality is noted.  IMPRESSION: Multiple dilated loops of mid to distal small bowel without definitive transition zone. The terminal ileum is within normal limits. These changes may be related to adhesions or possibly a small bowel ileus. Correlation with the physical exam is recommended.  Pole ladder polyp stable from the prior exam.  Stable low-density lesions in the spleen and left adrenal gland. No other new focal abnormality is noted.   Electronically Signed   By: MInez CatalinaM.D.   On: 09/15/2014 00:01   Ct Biopsy  08/25/2014   CLINICAL DATA:  History of non-Hodgkin's lymphoma. The patient requires bone marrow biopsy for restaging purposes.  EXAM: CT GUIDED ASPIRATE AND CORE BIOPSY OF RIGHT ILIAC BONE MARROW  ANESTHESIA/SEDATION: Versed 1.0 mg IV, Fentanyl 50 mcg IV  Total Moderate Sedation Time  10 minutes.  PROCEDURE: The procedure risks, benefits, and alternatives were explained to the patient. Questions regarding the procedure were encouraged and answered. The patient understands and consents to the procedure.  The right gluteal region was prepped with Betadine. Sterile gown and sterile gloves were used for the procedure. Local anesthesia was provided with 1% Lidocaine.  Under CT guidance, an 11 gauge OnControl bone cutting needle was advanced from a posterior approach into the right iliac bone. Needle positioning was confirmed with CT. Initial non heparinized and heparinized aspirate samples were obtained of bone marrow.  Core biopsy was performed  via the outer needle.  COMPLICATIONS: None  FINDINGS: Inspection of initial aspirate did reveal visible particles. Intact core biopsy was obtained.  IMPRESSION: CT guided bone marrow biopsy of right  posterior iliac bone with both aspirate and core samples obtained.   Electronically Signed   By: Aletta Edouard M.D.   On: 08/25/2014 09:55   Dg Chest Port 1 View  09/19/2014   CLINICAL DATA:  73 year old female with productive cough beginning last night, now with shortness of Breath. Initial encounter. Current history of lymphoma.  EXAM: PORTABLE CHEST - 1 VIEW  COMPARISON:  08/2014 and earlier.  FINDINGS: Portable AP semi upright view at 1506 hrs. Right chest porta cath is stable and currently accessed. Stable cardiac size and mediastinal contours. No pneumothorax or pulmonary edema. No pleural effusion or consolidation. Mild patchy opacity at both lung base. Retained oral contrast in normal appearing colon.  IMPRESSION: Mild patchy opacity at both medial lung bases. Could be atelectasis but lung base infection not excluded. Upright PA and lateral views of the chest would be valuable when possible.   Electronically Signed   By: Lars Pinks M.D.   On: 09/19/2014 15:21   Dg Chest Port 1 View  09/17/2014   CLINICAL DATA:  73 year old female with a history of dyspnea  EXAM: PORTABLE CHEST - 1 VIEW  COMPARISON:  09/14/2014, 09/07/2014, 07/07/2014  FINDINGS: Cardiomediastinal silhouette unchanged in size and contour. Atherosclerotic calcifications of the aortic arch.  No confluent airspace disease. No visualized pneumothorax or pleural effusion.  Right hemidiaphragm elevated compared to the left.  Coarsened interstitial markings.  Single-lumen port catheter on the right chest wall with Huber needle in place. Catheter traverses a right IJ approach and terminates in the region of the upper right atrium.  No displaced fracture.  Unremarkable appearance of the upper abdomen.  IMPRESSION: No radiographic evidence of acute  cardiopulmonary disease.  Atherosclerosis.  Elevation of the right hemidiaphragm, potentially positional.  Single-lumen port catheter on the right chest wall.  Signed,  Dulcy Fanny. Earleen Newport, DO  [facility]  [facility]   Electronically Signed   By: Corrie Mckusick D.O.   On: 09/17/2014 10:00   Dg Chest Port 1 View  09/07/2014   CLINICAL DATA:  Chest pain, shortness of breath and nausea beginning this morning. History of non-Hodgkin's lymphoma.  EXAM: PORTABLE CHEST - 1 VIEW  COMPARISON:  Chest radiograph July 17, 2014  FINDINGS: The cardiac silhouette appears mildly enlarged, similar. Calcified aortic knob. Retrocardiac consolidation. Similar central pulmonary vascular congestion. No pleural effusions. No pneumothorax.  Mildly widened RIGHT acromioclavicular joint space may be postoperative. Single lumen RIGHT chest Port-A-Cath with distal tip at the caval atrial junction.  IMPRESSION: Retrocardiac consolidation concerning for pneumonia. Recommend followup chest radiograph after treatment to verify improvement.  Stable cardiomegaly and central pulmonary vascular congestion.   Electronically Signed   By: Elon Alas   On: 09/07/2014 04:14   Dg Abd 2 Views  09/16/2014   CLINICAL DATA:  Partial small bowel obstruction/ileus.  EXAM: ABDOMEN - 2 VIEW  COMPARISON:  09/15/2014  FINDINGS: Further radiographic improvement present with essentially complete resolution of partial small bowel obstruction/ileus. There is further contrast transit into the colon. No dilated small bowel loops remain. No evidence of free intraperitoneal air or pneumatosis.  IMPRESSION: Resolution of partial small bowel obstruction/ileus.   Electronically Signed   By: Aletta Edouard M.D.   On: 09/16/2014 07:57   Dg Abd 2 Views  09/15/2014   CLINICAL DATA:  Followup for small bowel obstruction.  EXAM: ABDOMEN - 2 VIEW  COMPARISON:  CT, 09/14/2014  FINDINGS: There is persistent small bowel  dilation with air-fluid  levels consistent with a moderate grade partial small bowel obstruction.  No free air.  Surgical vascular clips are noted in the right lower quadrant. There are vascular calcifications along the aorta and iliac vessels.  IMPRESSION: 1. Persistent partial small bowel obstruction without significant change from the previous day's CT scan. No free air.   Electronically Signed   By: Lajean Manes M.D.   On: 09/15/2014 10:04   Dg Abd Acute W/chest  09/14/2014   CLINICAL DATA:  Initial evaluation for vomiting and diarrhea for 1 week. History of lymphoma.  EXAM: ACUTE ABDOMEN SERIES (ABDOMEN 2 VIEW & CHEST 1 VIEW)  COMPARISON:  Prior radiograph from 09/07/2014.  FINDINGS: Transverse heart size at the upper limits of normal, stable from prior. Mediastinal silhouette within normal limits. Atherosclerotic plaque present within the aortic arch. Right-sided Port-A-Cath stable disc position with tip overlying the cavoatrial junction.  Lungs are normally inflated. No focal infiltrate, pulmonary edema or pleural effusion. No pneumothorax.  Bowel gas pattern within normal limits without evidence of obstruction or ileus. No abnormal bowel wall thickening. No free intraperitoneal air. No soft tissue mass or abnormal calcifications. Surgical clips overlie the right lower quadrant.  Multilevel degenerative changes present throughout the visualized spine, most prevalent at the lumbosacral junction. No acute osseous abnormality.  IMPRESSION: 1. Nonobstructive bowel gas pattern with no radiographic evidence of acute intra-abdominal abnormality. 2. No active cardiopulmonary disease.   Electronically Signed   By: Jeannine Boga M.D.   On: 09/14/2014 20:47    Microbiology: Recent Results (from the past 240 hour(s))  Culture, blood (routine x 2)     Status: None (Preliminary result)   Collection Time: 09/18/14 10:40 AM  Result Value Ref Range Status   Specimen Description BLOOD LEFT AVF  Final    Special Requests BOTTLES DRAWN AEROBIC AND ANAEROBIC 10CC  Final   Culture  Setup Time   Final    09/18/2014 18:59 Performed at [facility]    Culture   Final           BLOOD CULTURE RECEIVED NO GROWTH TO DATE CULTURE WILL BE HELD FOR 5 DAYS BEFORE ISSUING A FINAL NEGATIVE REPORT Performed at [facility]    Report Status PENDING  Incomplete  Culture, blood (routine x 2)     Status: None (Preliminary result)   Collection Time: 09/18/14 10:50 AM  Result Value Ref Range Status   Specimen Description BLOOD LEFT AVF  Final   Special Requests BOTTLES DRAWN AEROBIC AND ANAEROBIC 10CC  Final   Culture  Setup Time   Final    09/18/2014 18:59 Performed at [facility]    Culture   Final           BLOOD CULTURE RECEIVED NO GROWTH TO DATE CULTURE WILL BE HELD FOR 5 DAYS BEFORE ISSUING A FINAL NEGATIVE REPORT Performed at [facility]    Report Status PENDING  Incomplete     Labs: Basic Metabolic Panel:  Recent Labs Lab 09/14/14 1858 09/15/14 0615 09/16/14 0438 09/16/14 1248 09/18/14 0945 09/20/14 0500  NA 137 137 137  --  141 139  K 5.1 5.8* 4.9  --  4.9 4.6  CL 90* 90* 94*  --  97 96  CO2 _0 --  23 24  GLUCOSE 164* 151* 79  --  112* 108*  BUN 60* 67* 35*  --  54* 53*  CREATININE 7.41* 7.78* 5.08*  --  6.36* 5.93*  CALCIUM  9.4 9.3 8.8  --  8.8 8.5  PHOS  --   --   --  4.6 4.5  --    Liver Function Tests:  Recent Labs Lab 09/14/14 1858 09/15/14 0615 09/18/14 0945  AST 14 13  --   ALT 7 6  --   ALKPHOS 69 65  --   BILITOT 0.4 0.4  --   PROT 6.3 6.0  --   ALBUMIN 3.4* 3.3* 2.7*    Recent Labs Lab 09/14/14 1858  LIPASE 33   No results for input(s): AMMONIA in the last 168 hours. CBC:  Recent Labs Lab 09/14/14 1858 09/15/14 0615 09/16/14 0438 09/17/14 1102 09/18/14 0940 09/20/14 0817  WBC 4.7 7.2 6.8 7.3 7.4 6.3  NEUTROABS 2.6  --   --   --   --   --   HGB 14.1 13.4 12.5 12.5 11.9* 11.4*  HCT 43.2 40.6 38.9 39.7  37.8 35.4*  MCV 100.0 98.5 98.2 99.7 98.7 98.1  PLT 118* 107* 64* 77* 103* 98*   Cardiac Enzymes:  Recent Labs Lab 09/14/14 1858 09/17/14 1102 09/17/14 1550 09/17/14 2208  TROPONINI <0.30 <0.30 <0.30 <0.30   BNP: BNP (last 3 results)  Recent Labs  11/03/13 1655 09/07/14 0400  PROBNP 6465.0* >70000.0*   CBG:  Recent Labs Lab 09/15/14 0817  GLUCAP 165*      Signed:  Vernell Leep, MD, FACP, FHM. [facility] Pager [phone]  If 7PM-7AM, please contact night-coverage [email or web address] Password [email or web address] 09/21/2014, 1:40 PM

## 2014-09-21 NOTE — Progress Notes (Signed)
09/21/14 18:16 Patient discharged to [facility],pt picked up by [facility] transport. Abbas Beyene, Wonda Cheng, Therapist, sports

## 2014-09-21 NOTE — Plan of Care (Signed)
Problem: Phase III Progression Outcomes Goal: Discharge plan remains appropriate-arrangements made Outcome: Completed/Met Date Met:  09/21/14     

## 2014-09-21 NOTE — Progress Notes (Addendum)
ANTICOAGULATION CONSULT NOTE - Initial Consult  Pharmacy Consult for coumadin Indication: atrial fibrillation  Allergies  Allergen Reactions  . Advicor [Niacin-Lovastatin Er]     Muscle cramps  . Crestor [Rosuvastatin]     Muscle cramps  . Lipitor [Atorvastatin]     Muscle cramps  . Lovastatin     Muscle cramps  . Morphine And Related Other (See Comments)    "made me crazy"  . Niaspan [Niacin Er]     Muscle cramps  . Nsaids     Gi side effects  . Pravachol [Pravastatin Sodium]     Gi side effects  . Sulfa Antibiotics Other (See Comments)    Can't remember   . Vytorin [Ezetimibe-Simvastatin] Nausea Only  . Welchol [Colesevelam Hcl]     Muscle cramps  . Zetia [Ezetimibe]     Muscle cramps   . Zocor [Simvastatin]     Muscle cramps     Patient Measurements: Height: 5\' 2"  (157.5 cm) Weight: 118 lb 6.2 oz (53.7 kg) IBW/kg (Calculated) : 50.1 Heparin Dosing Weight:   Vital Signs: Temp: 99.3 F (37.4 C) (11/24 0630) Temp Source: Oral (11/24 0630) BP: 134/86 mmHg (11/24 0630) Pulse Rate: 54 (11/24 0630)  Labs:  Recent Labs  09/20/14 0500 09/20/14 0817  HGB  --  11.4*  HCT  --  35.4*  PLT  --  98*  CREATININE 5.93*  --     Estimated Creatinine Clearance: 6.8 mL/min (by C-G formula based on Cr of 5.93).   Medical History: Past Medical History  Diagnosis Date  . Hypertension   . Anxiety     h/o anxiety attacks  . Anemia of renal disease   . Claustrophobia   . Panic attacks   . 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 CAD (coronary artery disease)   . Atrial fibrillation   . High cholesterol     hx  . Type 2 diabetes mellitus     "don't take RX now" (11/04/2013)  . Arthritis     "left hip" (11/04/2013)  . ESRD (end stage renal disease) on dialysis 2014    "[facility]; TTS" (11/04/2013)  . 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 amiodarone  200 mg Oral BID  . amLODipine   5 mg Oral Daily  . calcitRIOL  0.5 mcg Oral Q T,Th,[name]  . docusate sodium  100 mg Oral BID  . feeding supplement (NEPRO CARB STEADY)  237 mL Oral BID BM  . ferric gluconate (FERRLECIT/NULECIT) IV  62.5 mg Intravenous Weekly  . fluticasone  2 spray Each Nare BH-q7a  . heparin  5,000 Units Subcutaneous 3 times per day  . hydrocortisone   Rectal BID  . lanthanum  500 mg Oral TID WC  . metoprolol succinate  37.5 mg Oral BID  . pantoprazole  40 mg Oral Daily  . rosuvastatin  10 mg Oral QHS  . senna-docusate  2 tablet Oral QHS  . valACYclovir  500 mg Oral Q T,Th,Sat-1800   Infusions:    Assessment: 73 yo female with afib will be started on coumadin.  A while ago she was on pradaxa but since developed renal failure, has been off it.  Baseline INR was 1.37 on 09/15/14. Coumadin score 1. Patient is on amiodarone  Goal of Therapy:  INR 2-3 Monitor platelets by anticoagulation protocol: Yes   Plan:  - coumadin 2.5mg  po x1 -  INR in am - d/c heparin SQ when INR >/= 2  Kyia Rhude, Tsz-Yin 09/21/2014,10:31 AM

## 2014-09-21 NOTE — Progress Notes (Signed)
Report called to [facility] RN receiving patient.PTAR called by SW.Awaiting transport.Husband at bedside. Neila Teem, Wonda Cheng, Therapist, sports

## 2014-09-21 NOTE — Progress Notes (Signed)
Subjective:  Calm, pleasant, productive cough, but breathing better, no nausea or vomiting  Objective: Vital signs in last 24 hours: Temp:  [97.9 F (36.6 C)-100.3 F (37.9 C)] 99.3 F (37.4 C) (11/24 0630) Pulse Rate:  [54-119] 54 (11/24 0630) Resp:  [16-25] 16 (11/24 0630) BP: (99-167)/(64-94) 134/86 mmHg (11/24 0630) SpO2:  [93 %-96 %] 94 % (11/24 0630) Weight:  [53 kg (116 lb 13.5 oz)-53.7 kg (118 lb 6.2 oz)] 53.7 kg (118 lb 6.2 oz) (11/23 2054) Weight change: -0.8 kg (-1 lb 12.2 oz)  Intake/Output from previous day:   Intake/Output this shift:   EXAM: General appearance:  Alert, in no apparent distress Resp:  CTA without rales, rhonchi, or wheezes Cardio:  Irregular rhythm, no murmur or rub GI:  + BS, soft and nontender Extremities:  No edema, dressings on both ankles Access:  AVF @ LUA with + bruit  Lab Results:  Recent Labs  09/18/14 0940 09/20/14 0817  WBC 7.4 6.3  HGB 11.9* 11.4*  HCT 37.8 35.4*  PLT 103* 98*   BMET:  Recent Labs  09/18/14 0945 09/20/14 0500  NA 141 139  K 4.9 4.6  CL 97 96  CO2 23 24  GLUCOSE 112* 108*  BUN 54* 53*  CREATININE 6.36* 5.93*  CALCIUM 8.8 8.5  ALBUMIN 2.7*  --    Scheduled Medications . amiodarone  200 mg Oral BID  . amLODipine  5 mg Oral Daily  . calcitRIOL  0.5 mcg Oral Q T,Th,[name]  . docusate sodium  100 mg Oral BID  . feeding supplement (NEPRO CARB STEADY)  237 mL Oral BID BM  . ferric gluconate (FERRLECIT/NULECIT) IV  62.5 mg Intravenous Weekly  . fluticasone  2 spray Each Nare BH-q7a  . heparin  5,000 Units Subcutaneous 3 times per day  . hydrocortisone   Rectal BID  . lanthanum  500 mg Oral TID WC  . metoprolol succinate  37.5 mg Oral BID  . pantoprazole  40 mg Oral Daily  . rosuvastatin  10 mg Oral QHS  . senna-docusate  2 tablet Oral QHS  . valACYclovir  500 mg Oral Q T,Th,Sat-1800  . warfarin  2.5 mg Oral ONCE-1800  . Warfarin - Pharmacist Dosing Inpatient   Does not apply q1800    Dialysis  Orders: TTS @ [facility] 54kgs 2K/2Ca 4 hr 3500 heparin L AVF 400/800 Calcitriol 0.50 mcg Aranesp 200 q week Venofer 50 q week  Assessment/Plan: 1. Altered mental status - close to baseline today, alert and oriented. 2. Abdominal pain / ileus and fecal impaction - resolved, tolerating soft diet without pain or nausea. 3. ESRD - HD on TTS @ [facility], K 4.6.  HD tomorrow per holiday schedule. 4. HTN/Volume - BP 134/86 on Amlodipine 5 mg qd, Metoprolol 37.5 mg bid; wt 53.7 kg, @ [facility]. 5. Anemia - Hgb 11.4, weekly Fe, Aranesp on hold. 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 7. Nutrition - Alb 2.7, soft diet. 8. A-fib w/ RVR - Metoprolol and Amiodarone restarted, HR controlled.Coumadin to be added and SNF will need to do the monitoring since we cannot do STAT INR's out of the HD unit, esp with coumadin just being initiated. Once out of SNF it will need to be followed by either Dr. Marin Olp or Dr. [name] 9. Non-Hodgkin's lymphoma - in remission, followed by Dr. Marin Olp. 10. Thrombocytopenia - Plts 98K, stable. 11. Goals of care - DNR, continue to assess  pt's desire to continue HD. 12. Disposition - discharge to SNF. SNF will need to be aware of her holiday dialysis schedule.  THIS WEEK ONLY her dialysis days are Mon-Wed-Sat and next week will return to her usual Tues[place]ursSat -[place]   LOS: 7 days   LYLES,CHARLES 09/21/2014,7:18 AM  I have seen and examined this patient and agree with plan and assessment in the above note with highlighted additions and those additions have been discussed with Dr. Algis Liming for purposes of hospital discharge to the SNF. He believes she will be going to [facility].  Rock Sobol B,MD 09/21/2014 10:58 AM

## 2014-09-21 NOTE — Plan of Care (Signed)
Problem: Phase II Progression Outcomes Goal: Discharge plan established Outcome: Completed/Met Date Met:  09/21/14

## 2014-09-24 LAB — CULTURE, BLOOD (ROUTINE X 2)
Culture: NO GROWTH
Culture: NO GROWTH

## 2014-10-04 ENCOUNTER — Other Ambulatory Visit: Payer: Medicare Other | Admitting: Lab

## 2014-10-04 ENCOUNTER — Ambulatory Visit: Payer: Medicare Other | Admitting: Hematology & Oncology

## 2014-10-29 DEATH — deceased

## 2014-12-02 ENCOUNTER — Other Ambulatory Visit: Payer: Self-pay | Admitting: Family

## 2014-12-10 IMAGING — CT CT BIOPSY
2 of 4 series · 4 of 12 positions shown, 7 images · non-contrast
Comparison: none

CLINICAL DATA: History of non-Hodgkin's lymphoma. The patient
requires bone marrow biopsy for restaging purposes.

[Series 5: add scan 5.0 b70f · axial · 0.74mm/px · z∈[-114,-109]mm · 2 of 4 slices shown, 5 images (1 of 2)]
[im 2/4  soft-tissue]
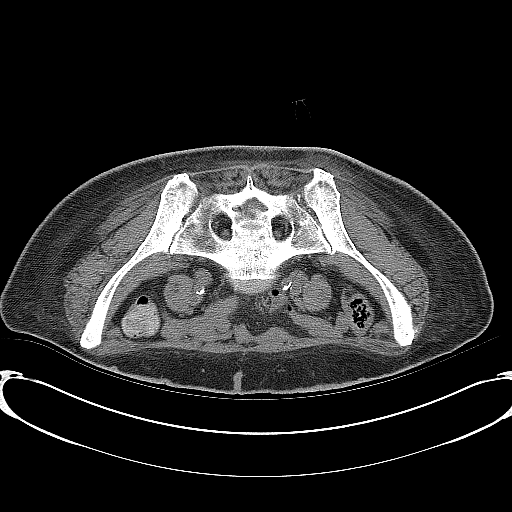
[im 2/4  lung]
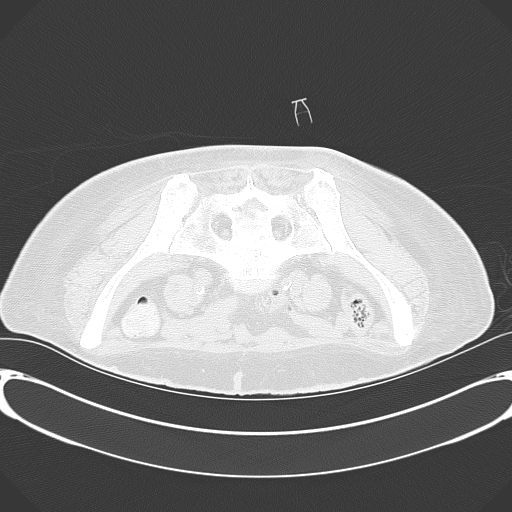
[im 2/4  bone]
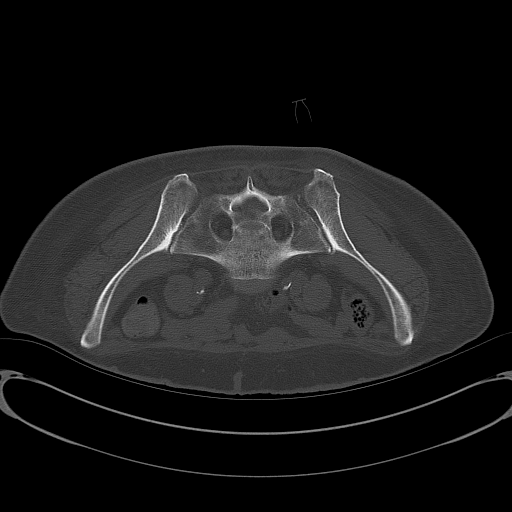
[im 3/4  soft-tissue]
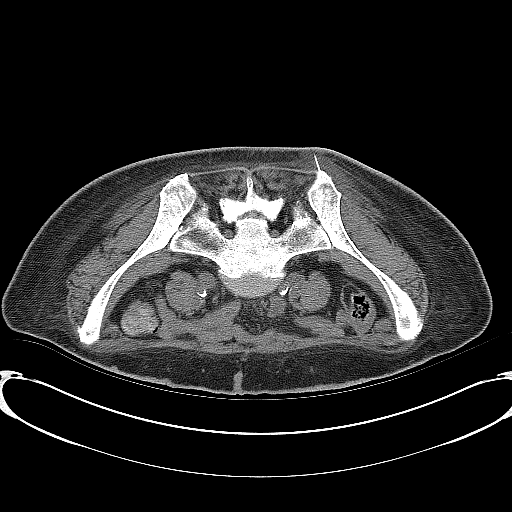
[im 3/4  lung]
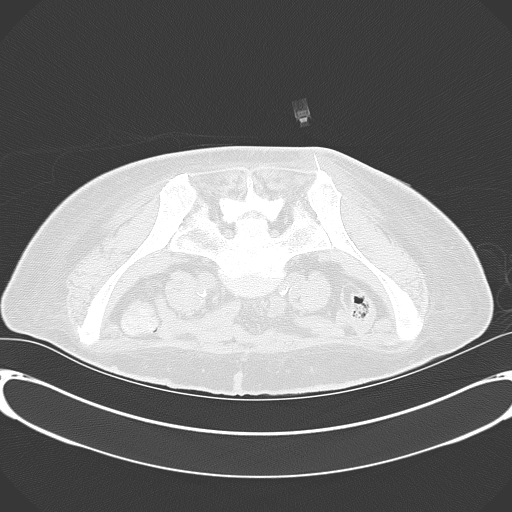

[Series 6: add scan 5.0 b70f · axial · 0.74mm/px · z∈[-114,-109]mm · 2 of 4 slices shown (2 of 2)]
[im 2/4  soft-tissue]
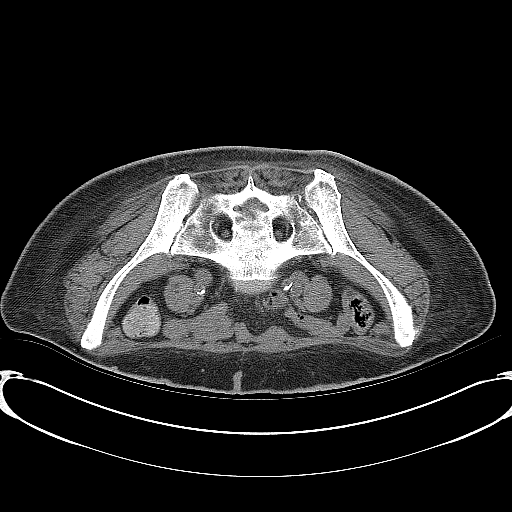
[im 3/4  soft-tissue]
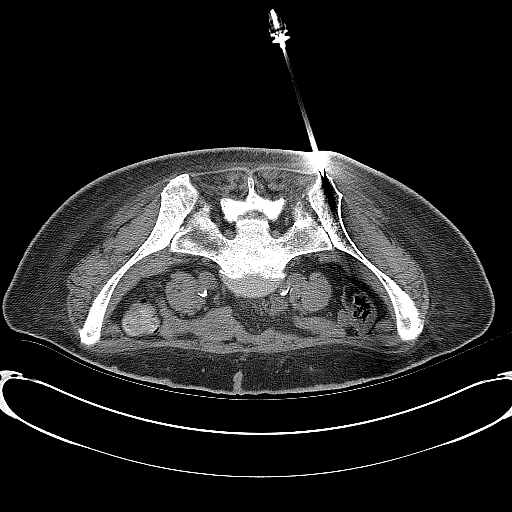

[4 of 12 positions shown; findings below may reference images not displayed]

EXAM:
CT GUIDED ASPIRATE AND CORE BIOPSY OF RIGHT ILIAC BONE MARROW

ANESTHESIA/SEDATION:
Versed 1.0 mg IV, Fentanyl 50 mcg IV

Total Moderate Sedation Time

10 minutes.

PROCEDURE:
The procedure risks, benefits, and alternatives were explained to
the patient. Questions regarding the procedure were encouraged and
answered. The patient understands and consents to the procedure.

The right gluteal region was prepped with Betadine. Sterile gown and
sterile gloves were used for the procedure. Local anesthesia was
provided with 1% Lidocaine.

Under CT guidance, an 11 gauge OnControl bone cutting needle was
advanced from a posterior approach into the right iliac bone. Needle
positioning was confirmed with CT. Initial non heparinized and
heparinized aspirate samples were obtained of bone marrow.

Core biopsy was performed via the outer needle.

COMPLICATIONS:
None
FINDINGS: Inspection of initial aspirate did reveal visible particles. Intact
core biopsy was obtained.
IMPRESSION: CT guided bone marrow biopsy of right posterior iliac bone with both
aspirate and core samples obtained.

## 2015-04-25 ENCOUNTER — Other Ambulatory Visit: Payer: Self-pay
